# Patient Record
Sex: Female | Born: 1935 | Race: Black or African American | Hispanic: No | Smoking: Never smoker
Health system: Southern US, Community
[De-identification: ages and names within clinical notes are randomized; demographics above are authoritative.]

## PROBLEM LIST (undated history)

## (undated) DIAGNOSIS — E0789 Other specified disorders of thyroid: Secondary | ICD-10-CM

## (undated) DIAGNOSIS — I482 Chronic atrial fibrillation, unspecified: Secondary | ICD-10-CM

## (undated) DIAGNOSIS — I10 Essential (primary) hypertension: Secondary | ICD-10-CM

## (undated) DIAGNOSIS — E785 Hyperlipidemia, unspecified: Secondary | ICD-10-CM

## (undated) DIAGNOSIS — D649 Anemia, unspecified: Secondary | ICD-10-CM

## (undated) DIAGNOSIS — N183 Chronic kidney disease, stage 3 unspecified: Secondary | ICD-10-CM

## (undated) DIAGNOSIS — R569 Unspecified convulsions: Secondary | ICD-10-CM

## (undated) DIAGNOSIS — M1990 Unspecified osteoarthritis, unspecified site: Secondary | ICD-10-CM

## (undated) DIAGNOSIS — G459 Transient cerebral ischemic attack, unspecified: Secondary | ICD-10-CM

## (undated) DIAGNOSIS — R600 Localized edema: Secondary | ICD-10-CM

## (undated) DIAGNOSIS — E119 Type 2 diabetes mellitus without complications: Secondary | ICD-10-CM

## (undated) HISTORY — PX: CHOLECYSTECTOMY: SHX55

---

## 1999-08-16 ENCOUNTER — Inpatient Hospital Stay (HOSPITAL_COMMUNITY): Admission: EM | Admit: 1999-08-16 | Discharge: 1999-08-18 | Payer: Self-pay | Admitting: Cardiology

## 2001-09-27 ENCOUNTER — Encounter: Payer: Self-pay | Admitting: Emergency Medicine

## 2001-09-27 ENCOUNTER — Inpatient Hospital Stay (HOSPITAL_COMMUNITY): Admission: EM | Admit: 2001-09-27 | Discharge: 2001-09-28 | Payer: Self-pay | Admitting: Emergency Medicine

## 2003-05-09 ENCOUNTER — Emergency Department (HOSPITAL_COMMUNITY): Admission: EM | Admit: 2003-05-09 | Discharge: 2003-05-09 | Payer: Self-pay | Admitting: Emergency Medicine

## 2005-04-08 ENCOUNTER — Emergency Department (HOSPITAL_COMMUNITY): Admission: EM | Admit: 2005-04-08 | Discharge: 2005-04-08 | Payer: Self-pay | Admitting: Emergency Medicine

## 2005-10-17 ENCOUNTER — Emergency Department (HOSPITAL_COMMUNITY): Admission: EM | Admit: 2005-10-17 | Discharge: 2005-10-17 | Payer: Self-pay | Admitting: Emergency Medicine

## 2007-09-23 ENCOUNTER — Emergency Department (HOSPITAL_COMMUNITY): Admission: EM | Admit: 2007-09-23 | Discharge: 2007-09-23 | Payer: Self-pay | Admitting: Emergency Medicine

## 2007-11-16 ENCOUNTER — Emergency Department (HOSPITAL_COMMUNITY): Admission: EM | Admit: 2007-11-16 | Discharge: 2007-11-16 | Payer: Self-pay | Admitting: Emergency Medicine

## 2007-12-01 ENCOUNTER — Emergency Department (HOSPITAL_COMMUNITY): Admission: EM | Admit: 2007-12-01 | Discharge: 2007-12-01 | Payer: Self-pay | Admitting: Emergency Medicine

## 2008-09-19 ENCOUNTER — Emergency Department (HOSPITAL_COMMUNITY): Admission: EM | Admit: 2008-09-19 | Discharge: 2008-09-19 | Payer: Self-pay | Admitting: Emergency Medicine

## 2009-04-13 ENCOUNTER — Encounter (INDEPENDENT_AMBULATORY_CARE_PROVIDER_SITE_OTHER): Payer: Self-pay | Admitting: *Deleted

## 2009-04-13 LAB — CONVERTED CEMR LAB
ALT: 13 units/L
AST: 12 units/L
Albumin: 4.1 g/dL
Alkaline Phosphatase: 106 units/L
BUN: 26 mg/dL
Bilirubin, Direct: 0.2 mg/dL
CO2: 26 meq/L
Calcium: 9.3 mg/dL
Chloride: 100 meq/L
Cholesterol: 206 mg/dL
Creatinine, Ser: 1.29 mg/dL
Glucose, Bld: 200 mg/dL
HDL: 85 mg/dL
Hgb A1c MFr Bld: 9 %
LDL Cholesterol: 104 mg/dL
Potassium: 3.3 meq/L
Sodium: 140 meq/L
Total Protein: 7.9 g/dL
Triglycerides: 87 mg/dL

## 2009-05-04 ENCOUNTER — Encounter (INDEPENDENT_AMBULATORY_CARE_PROVIDER_SITE_OTHER): Payer: Self-pay | Admitting: *Deleted

## 2009-07-29 ENCOUNTER — Inpatient Hospital Stay (HOSPITAL_COMMUNITY): Admission: EM | Admit: 2009-07-29 | Discharge: 2009-08-03 | Payer: Self-pay | Admitting: Emergency Medicine

## 2009-09-28 ENCOUNTER — Encounter (HOSPITAL_COMMUNITY): Admission: RE | Admit: 2009-09-28 | Discharge: 2009-10-28 | Payer: Self-pay | Admitting: Orthopaedic Surgery

## 2009-10-29 ENCOUNTER — Encounter (HOSPITAL_COMMUNITY): Admission: RE | Admit: 2009-10-29 | Discharge: 2009-11-28 | Payer: Self-pay | Admitting: Orthopaedic Surgery

## 2010-05-21 ENCOUNTER — Encounter: Payer: Self-pay | Admitting: Family Medicine

## 2010-05-22 ENCOUNTER — Encounter: Payer: Self-pay | Admitting: Family Medicine

## 2010-05-31 NOTE — Miscellaneous (Signed)
Summary: labs bmp,lipid,liver,a1c 04/13/2009  Clinical Lists Changes  Observations: Added new observation of CALCIUM: 9.3 mg/dL (04/13/2009 8:31) Added new observation of ALBUMIN: 4.1 g/dL (04/13/2009 8:31) Added new observation of PROTEIN, TOT: 7.9 g/dL (04/13/2009 8:31) Added new observation of SGPT (ALT): 13 units/L (04/13/2009 8:31) Added new observation of SGOT (AST): 12 units/L (04/13/2009 8:31) Added new observation of ALK PHOS: 106 units/L (04/13/2009 8:31) Added new observation of BILI DIRECT: 0.2 mg/dL (04/13/2009 8:31) Added new observation of CREATININE: 1.29 mg/dL (04/13/2009 8:31) Added new observation of BUN: 26 mg/dL (04/13/2009 8:31) Added new observation of BG RANDOM: 200 mg/dL (04/13/2009 8:31) Added new observation of CO2 PLSM/SER: 26 meq/L (04/13/2009 8:31) Added new observation of CL SERUM: 100 meq/L (04/13/2009 8:31) Added new observation of K SERUM: 3.3 meq/L (04/13/2009 8:31) Added new observation of NA: 140 meq/L (04/13/2009 8:31) Added new observation of LDL: 104 mg/dL (04/13/2009 8:31) Added new observation of HDL: 85 mg/dL (04/13/2009 8:31) Added new observation of TRIGLYC TOT: 87 mg/dL (04/13/2009 8:31) Added new observation of CHOLESTEROL: 206 mg/dL (04/13/2009 8:31) Added new observation of HGBA1C: 9.0 % (04/13/2009 8:31)

## 2010-07-20 LAB — CBC
HCT: 27.2 % — ABNORMAL LOW (ref 36.0–46.0)
HCT: 27.2 % — ABNORMAL LOW (ref 36.0–46.0)
HCT: 27.8 % — ABNORMAL LOW (ref 36.0–46.0)
HCT: 27.9 % — ABNORMAL LOW (ref 36.0–46.0)
HCT: 32.2 % — ABNORMAL LOW (ref 36.0–46.0)
Hemoglobin: 11 g/dL — ABNORMAL LOW (ref 12.0–15.0)
Hemoglobin: 9.3 g/dL — ABNORMAL LOW (ref 12.0–15.0)
Hemoglobin: 9.4 g/dL — ABNORMAL LOW (ref 12.0–15.0)
Hemoglobin: 9.5 g/dL — ABNORMAL LOW (ref 12.0–15.0)
Hemoglobin: 9.6 g/dL — ABNORMAL LOW (ref 12.0–15.0)
MCHC: 34.1 g/dL (ref 30.0–36.0)
MCHC: 34.2 g/dL (ref 30.0–36.0)
MCHC: 34.3 g/dL (ref 30.0–36.0)
MCHC: 34.3 g/dL (ref 30.0–36.0)
MCHC: 34.5 g/dL (ref 30.0–36.0)
MCV: 84 fL (ref 78.0–100.0)
MCV: 84 fL (ref 78.0–100.0)
MCV: 85.1 fL (ref 78.0–100.0)
MCV: 86.1 fL (ref 78.0–100.0)
MCV: 91.7 fL (ref 78.0–100.0)
Platelets: 146 10*3/uL — ABNORMAL LOW (ref 150–400)
Platelets: 161 10*3/uL (ref 150–400)
Platelets: 163 10*3/uL (ref 150–400)
Platelets: 164 10*3/uL (ref 150–400)
Platelets: 173 10*3/uL (ref 150–400)
RBC: 3.04 MIL/uL — ABNORMAL LOW (ref 3.87–5.11)
RBC: 3.24 MIL/uL — ABNORMAL LOW (ref 3.87–5.11)
RBC: 3.24 MIL/uL — ABNORMAL LOW (ref 3.87–5.11)
RBC: 3.24 MIL/uL — ABNORMAL LOW (ref 3.87–5.11)
RBC: 3.78 MIL/uL — ABNORMAL LOW (ref 3.87–5.11)
RDW: 14.8 % (ref 11.5–15.5)
RDW: 14.9 % (ref 11.5–15.5)
RDW: 15.3 % (ref 11.5–15.5)
RDW: 15.3 % (ref 11.5–15.5)
RDW: 15.6 % — ABNORMAL HIGH (ref 11.5–15.5)
WBC: 10.2 10*3/uL (ref 4.0–10.5)
WBC: 10.4 10*3/uL (ref 4.0–10.5)
WBC: 10.8 10*3/uL — ABNORMAL HIGH (ref 4.0–10.5)
WBC: 9.4 10*3/uL (ref 4.0–10.5)
WBC: 9.6 10*3/uL (ref 4.0–10.5)

## 2010-07-20 LAB — BASIC METABOLIC PANEL
BUN: 14 mg/dL (ref 6–23)
BUN: 17 mg/dL (ref 6–23)
BUN: 17 mg/dL (ref 6–23)
BUN: 18 mg/dL (ref 6–23)
BUN: 20 mg/dL (ref 6–23)
CO2: 29 mEq/L (ref 19–32)
CO2: 29 mEq/L (ref 19–32)
CO2: 30 mEq/L (ref 19–32)
CO2: 31 mEq/L (ref 19–32)
CO2: 32 mEq/L (ref 19–32)
Calcium: 8.6 mg/dL (ref 8.4–10.5)
Calcium: 8.7 mg/dL (ref 8.4–10.5)
Calcium: 8.8 mg/dL (ref 8.4–10.5)
Calcium: 8.9 mg/dL (ref 8.4–10.5)
Calcium: 9.4 mg/dL (ref 8.4–10.5)
Chloride: 101 mEq/L (ref 96–112)
Chloride: 101 mEq/L (ref 96–112)
Chloride: 102 mEq/L (ref 96–112)
Chloride: 104 mEq/L (ref 96–112)
Chloride: 99 mEq/L (ref 96–112)
Creatinine, Ser: 0.95 mg/dL (ref 0.4–1.2)
Creatinine, Ser: 0.98 mg/dL (ref 0.4–1.2)
Creatinine, Ser: 1.14 mg/dL (ref 0.4–1.2)
Creatinine, Ser: 1.17 mg/dL (ref 0.4–1.2)
Creatinine, Ser: 1.32 mg/dL — ABNORMAL HIGH (ref 0.4–1.2)
GFR calc Af Amer: 48 mL/min — ABNORMAL LOW (ref >60)
GFR calc Af Amer: 55 mL/min — ABNORMAL LOW (ref >60)
GFR calc Af Amer: 57 mL/min — ABNORMAL LOW (ref >60)
GFR calc Af Amer: >60 mL/min (ref >60)
GFR calc Af Amer: >60 mL/min (ref >60)
GFR calc non Af Amer: 40 mL/min — ABNORMAL LOW (ref >60)
GFR calc non Af Amer: 46 mL/min — ABNORMAL LOW (ref >60)
GFR calc non Af Amer: 47 mL/min — ABNORMAL LOW (ref >60)
GFR calc non Af Amer: 56 mL/min — ABNORMAL LOW (ref >60)
GFR calc non Af Amer: 58 mL/min — ABNORMAL LOW (ref >60)
Glucose, Bld: 113 mg/dL — ABNORMAL HIGH (ref 70–99)
Glucose, Bld: 165 mg/dL — ABNORMAL HIGH (ref 70–99)
Glucose, Bld: 321 mg/dL — ABNORMAL HIGH (ref 70–99)
Glucose, Bld: 88 mg/dL (ref 70–99)
Glucose, Bld: 95 mg/dL (ref 70–99)
Potassium: 3.2 mEq/L — ABNORMAL LOW (ref 3.5–5.1)
Potassium: 3.5 mEq/L (ref 3.5–5.1)
Potassium: 3.7 mEq/L (ref 3.5–5.1)
Potassium: 3.8 mEq/L (ref 3.5–5.1)
Potassium: 3.8 mEq/L (ref 3.5–5.1)
Sodium: 136 mEq/L (ref 135–145)
Sodium: 137 mEq/L (ref 135–145)
Sodium: 137 mEq/L (ref 135–145)
Sodium: 138 mEq/L (ref 135–145)
Sodium: 138 mEq/L (ref 135–145)

## 2010-07-20 LAB — GLUCOSE, CAPILLARY
Glucose-Capillary: 101 mg/dL — ABNORMAL HIGH (ref 70–99)
Glucose-Capillary: 109 mg/dL — ABNORMAL HIGH (ref 70–99)
Glucose-Capillary: 112 mg/dL — ABNORMAL HIGH (ref 70–99)
Glucose-Capillary: 133 mg/dL — ABNORMAL HIGH (ref 70–99)
Glucose-Capillary: 141 mg/dL — ABNORMAL HIGH (ref 70–99)
Glucose-Capillary: 148 mg/dL — ABNORMAL HIGH (ref 70–99)
Glucose-Capillary: 161 mg/dL — ABNORMAL HIGH (ref 70–99)
Glucose-Capillary: 168 mg/dL — ABNORMAL HIGH (ref 70–99)
Glucose-Capillary: 174 mg/dL — ABNORMAL HIGH (ref 70–99)
Glucose-Capillary: 177 mg/dL — ABNORMAL HIGH (ref 70–99)
Glucose-Capillary: 180 mg/dL — ABNORMAL HIGH (ref 70–99)
Glucose-Capillary: 199 mg/dL — ABNORMAL HIGH (ref 70–99)
Glucose-Capillary: 200 mg/dL — ABNORMAL HIGH (ref 70–99)
Glucose-Capillary: 204 mg/dL — ABNORMAL HIGH (ref 70–99)
Glucose-Capillary: 220 mg/dL — ABNORMAL HIGH (ref 70–99)
Glucose-Capillary: 222 mg/dL — ABNORMAL HIGH (ref 70–99)
Glucose-Capillary: 224 mg/dL — ABNORMAL HIGH (ref 70–99)
Glucose-Capillary: 338 mg/dL — ABNORMAL HIGH (ref 70–99)
Glucose-Capillary: 94 mg/dL (ref 70–99)
Glucose-Capillary: 94 mg/dL (ref 70–99)

## 2010-07-20 LAB — DIFFERENTIAL
Basophils Absolute: 0 10*3/uL (ref 0.0–0.1)
Basophils Absolute: 0 10*3/uL (ref 0.0–0.1)
Basophils Absolute: 0 10*3/uL (ref 0.0–0.1)
Basophils Absolute: 0 10*3/uL (ref 0.0–0.1)
Basophils Absolute: 0 10*3/uL (ref 0.0–0.1)
Basophils Relative: 0 % (ref 0–1)
Basophils Relative: 0 % (ref 0–1)
Basophils Relative: 0 % (ref 0–1)
Basophils Relative: 0 % (ref 0–1)
Basophils Relative: 1 % (ref 0–1)
Eosinophils Absolute: 0 10*3/uL (ref 0.0–0.7)
Eosinophils Absolute: 0.5 10*3/uL (ref 0.0–0.7)
Eosinophils Absolute: 0.5 10*3/uL (ref 0.0–0.7)
Eosinophils Absolute: 0.6 10*3/uL (ref 0.0–0.7)
Eosinophils Absolute: 0.6 10*3/uL (ref 0.0–0.7)
Eosinophils Relative: 0 % (ref 0–5)
Eosinophils Relative: 5 % (ref 0–5)
Eosinophils Relative: 5 % (ref 0–5)
Eosinophils Relative: 6 % — ABNORMAL HIGH (ref 0–5)
Eosinophils Relative: 7 % — ABNORMAL HIGH (ref 0–5)
Lymphocytes Relative: 18 % (ref 12–46)
Lymphocytes Relative: 21 % (ref 12–46)
Lymphocytes Relative: 23 % (ref 12–46)
Lymphocytes Relative: 26 % (ref 12–46)
Lymphocytes Relative: 26 % (ref 12–46)
Lymphs Abs: 1.9 10*3/uL (ref 0.7–4.0)
Lymphs Abs: 2.2 10*3/uL (ref 0.7–4.0)
Lymphs Abs: 2.2 10*3/uL (ref 0.7–4.0)
Lymphs Abs: 2.4 10*3/uL (ref 0.7–4.0)
Lymphs Abs: 2.7 10*3/uL (ref 0.7–4.0)
Monocytes Absolute: 0.5 10*3/uL (ref 0.1–1.0)
Monocytes Absolute: 0.9 10*3/uL (ref 0.1–1.0)
Monocytes Absolute: 0.9 10*3/uL (ref 0.1–1.0)
Monocytes Absolute: 0.9 10*3/uL (ref 0.1–1.0)
Monocytes Absolute: 1 10*3/uL (ref 0.1–1.0)
Monocytes Relative: 10 % (ref 3–12)
Monocytes Relative: 10 % (ref 3–12)
Monocytes Relative: 5 % (ref 3–12)
Monocytes Relative: 9 % (ref 3–12)
Monocytes Relative: 9 % (ref 3–12)
Neutro Abs: 5.4 10*3/uL (ref 1.7–7.7)
Neutro Abs: 5.9 10*3/uL (ref 1.7–7.7)
Neutro Abs: 6.3 10*3/uL (ref 1.7–7.7)
Neutro Abs: 6.5 10*3/uL (ref 1.7–7.7)
Neutro Abs: 8.2 10*3/uL — ABNORMAL HIGH (ref 1.7–7.7)
Neutrophils Relative %: 57 % (ref 43–77)
Neutrophils Relative %: 61 % (ref 43–77)
Neutrophils Relative %: 61 % (ref 43–77)
Neutrophils Relative %: 64 % (ref 43–77)
Neutrophils Relative %: 77 % (ref 43–77)

## 2010-07-20 LAB — RETICULOCYTES
RBC.: 3.04 MIL/uL — ABNORMAL LOW (ref 3.87–5.11)
Retic Count, Absolute: 82.1 10*3/uL (ref 19.0–186.0)
Retic Ct Pct: 2.7 % (ref 0.4–3.1)

## 2010-07-20 LAB — FERRITIN: Ferritin: 76 ng/mL (ref 10–291)

## 2010-07-20 LAB — HEMOGLOBIN A1C
Hgb A1c MFr Bld: 8 % — ABNORMAL HIGH (ref 4.6–6.1)
Mean Plasma Glucose: 183 mg/dL

## 2010-07-20 LAB — VITAMIN B12: Vitamin B-12: 461 pg/mL (ref 211–911)

## 2010-07-20 LAB — IRON AND TIBC
Iron: 13 ug/dL — ABNORMAL LOW (ref 42–135)
Saturation Ratios: 6 % — ABNORMAL LOW (ref 20–55)
TIBC: 209 ug/dL — ABNORMAL LOW (ref 250–470)
UIBC: 196 ug/dL

## 2010-07-20 LAB — MAGNESIUM: Magnesium: 1.6 mg/dL (ref 1.5–2.5)

## 2010-07-20 LAB — FOLATE: Folate: 14.4 ng/mL

## 2010-07-24 LAB — CBC
HCT: 32.3 % — ABNORMAL LOW (ref 36.0–46.0)
Hemoglobin: 11.1 g/dL — ABNORMAL LOW (ref 12.0–15.0)
MCHC: 34.5 g/dL (ref 30.0–36.0)
MCV: 84.8 fL (ref 78.0–100.0)
Platelets: 165 10*3/uL (ref 150–400)
RBC: 3.81 MIL/uL — ABNORMAL LOW (ref 3.87–5.11)
RDW: 15.3 % (ref 11.5–15.5)
WBC: 8.8 10*3/uL (ref 4.0–10.5)

## 2010-07-24 LAB — DIFFERENTIAL
Basophils Absolute: 0 10*3/uL (ref 0.0–0.1)
Basophils Relative: 0 % (ref 0–1)
Eosinophils Absolute: 0.3 10*3/uL (ref 0.0–0.7)
Eosinophils Relative: 3 % (ref 0–5)
Lymphocytes Relative: 28 % (ref 12–46)
Lymphs Abs: 2.5 10*3/uL (ref 0.7–4.0)
Monocytes Absolute: 0.8 10*3/uL (ref 0.1–1.0)
Monocytes Relative: 9 % (ref 3–12)
Neutro Abs: 5.2 10*3/uL (ref 1.7–7.7)
Neutrophils Relative %: 60 % (ref 43–77)

## 2010-07-24 LAB — GLUCOSE, CAPILLARY
Glucose-Capillary: 134 mg/dL — ABNORMAL HIGH (ref 70–99)
Glucose-Capillary: 215 mg/dL — ABNORMAL HIGH (ref 70–99)

## 2010-07-24 LAB — URINE MICROSCOPIC-ADD ON

## 2010-07-24 LAB — URINALYSIS, ROUTINE W REFLEX MICROSCOPIC
Bilirubin Urine: NEGATIVE
Glucose, UA: NEGATIVE mg/dL
Ketones, ur: NEGATIVE mg/dL
Leukocytes, UA: NEGATIVE
Nitrite: NEGATIVE
Protein, ur: NEGATIVE mg/dL
Specific Gravity, Urine: 1.015 (ref 1.005–1.030)
Urobilinogen, UA: 0.2 mg/dL (ref 0.0–1.0)
pH: 5 (ref 5.0–8.0)

## 2010-07-24 LAB — APTT: aPTT: 32 seconds (ref 24–37)

## 2010-07-24 LAB — BASIC METABOLIC PANEL
BUN: 25 mg/dL — ABNORMAL HIGH (ref 6–23)
CO2: 31 mEq/L (ref 19–32)
Calcium: 9.5 mg/dL (ref 8.4–10.5)
Chloride: 102 mEq/L (ref 96–112)
Creatinine, Ser: 1.45 mg/dL — ABNORMAL HIGH (ref 0.4–1.2)
GFR calc Af Amer: 43 mL/min — ABNORMAL LOW (ref >60)
GFR calc non Af Amer: 36 mL/min — ABNORMAL LOW (ref >60)
Glucose, Bld: 150 mg/dL — ABNORMAL HIGH (ref 70–99)
Potassium: 3.5 mEq/L (ref 3.5–5.1)
Sodium: 139 mEq/L (ref 135–145)

## 2010-07-24 LAB — PROTIME-INR
INR: 1.09 (ref 0.00–1.49)
Prothrombin Time: 14 seconds (ref 11.6–15.2)

## 2010-08-09 LAB — POCT CARDIAC MARKERS
CKMB, poc: 1.1 ng/mL (ref 1.0–8.0)
CKMB, poc: 1.4 ng/mL (ref 1.0–8.0)
Myoglobin, poc: 118 ng/mL (ref 12–200)
Myoglobin, poc: 93.7 ng/mL (ref 12–200)
Troponin i, poc: <0.05 ng/mL (ref 0.00–0.09)
Troponin i, poc: <0.05 ng/mL (ref 0.00–0.09)

## 2010-08-09 LAB — BASIC METABOLIC PANEL
BUN: 32 mg/dL — ABNORMAL HIGH (ref 6–23)
CO2: 28 mEq/L (ref 19–32)
Calcium: 9.4 mg/dL (ref 8.4–10.5)
Chloride: 101 mEq/L (ref 96–112)
Creatinine, Ser: 1.49 mg/dL — ABNORMAL HIGH (ref 0.4–1.2)
GFR calc Af Amer: 42 mL/min — ABNORMAL LOW (ref >60)
GFR calc non Af Amer: 35 mL/min — ABNORMAL LOW (ref >60)
Glucose, Bld: 147 mg/dL — ABNORMAL HIGH (ref 70–99)
Potassium: 3.5 mEq/L (ref 3.5–5.1)
Sodium: 135 mEq/L (ref 135–145)

## 2010-08-09 LAB — DIFFERENTIAL
Basophils Absolute: 0 10*3/uL (ref 0.0–0.1)
Basophils Relative: 1 % (ref 0–1)
Eosinophils Absolute: 0.3 10*3/uL (ref 0.0–0.7)
Eosinophils Relative: 4 % (ref 0–5)
Lymphocytes Relative: 40 % (ref 12–46)
Lymphs Abs: 2.7 10*3/uL (ref 0.7–4.0)
Monocytes Absolute: 0.5 10*3/uL (ref 0.1–1.0)
Monocytes Relative: 8 % (ref 3–12)
Neutro Abs: 3.3 10*3/uL (ref 1.7–7.7)
Neutrophils Relative %: 48 % (ref 43–77)

## 2010-08-09 LAB — CBC
HCT: 32.6 % — ABNORMAL LOW (ref 36.0–46.0)
Hemoglobin: 11.3 g/dL — ABNORMAL LOW (ref 12.0–15.0)
MCHC: 34.7 g/dL (ref 30.0–36.0)
MCV: 86.3 fL (ref 78.0–100.0)
Platelets: 176 10*3/uL (ref 150–400)
RBC: 3.78 MIL/uL — ABNORMAL LOW (ref 3.87–5.11)
RDW: 13.8 % (ref 11.5–15.5)
WBC: 6.8 10*3/uL (ref 4.0–10.5)

## 2010-08-09 LAB — BRAIN NATRIURETIC PEPTIDE: Pro B Natriuretic peptide (BNP): 112 pg/mL — ABNORMAL HIGH (ref 0.0–100.0)

## 2010-08-31 ENCOUNTER — Other Ambulatory Visit (HOSPITAL_COMMUNITY): Payer: Self-pay | Admitting: Family Medicine

## 2010-08-31 ENCOUNTER — Telehealth: Payer: Self-pay

## 2010-08-31 DIAGNOSIS — Z139 Encounter for screening, unspecified: Secondary | ICD-10-CM

## 2010-08-31 NOTE — Telephone Encounter (Signed)
LMOM for pt to call in reference to the screening colonoscopy.

## 2010-09-08 ENCOUNTER — Ambulatory Visit (HOSPITAL_COMMUNITY): Payer: PRIVATE HEALTH INSURANCE

## 2010-09-08 NOTE — Telephone Encounter (Signed)
LM for pt to call or come by to get triaged.

## 2010-09-12 NOTE — Telephone Encounter (Signed)
Mailed letter to call to schedule screening colonoscopy.

## 2010-09-16 NOTE — Discharge Summary (Signed)
[facility]  Patient:    Chelsea Meza, Chelsea Meza Visit Number: [id number] MRN: [id number]          Service Type: MED Location: 2A A212 01 Attending Physician:  Delorise Jackson Admit Date:  09/27/2001 Discharge Date: 09/28/2001                             Discharge Summary  HISTORY OF PRESENT ILLNESS:  A 75 year old female with a history of rapid onset of palpitations, general weakness and nausea. The patient was in a store. She walked to the hospital and was seen in the emergency room and was noted to have supraventricular tachycardia with a heart rate of 190. She was treated with IV Adenocard 6 mg and Cardizem 20 mg IV. She converted to normal sinus rhythm with an initial rate of about 94. Her rate later dropped to about 74. Her initial blood pressure was 87/55, rose to 120/75. The patient became asymptomatic. She was stable at the time that I evaluated her. She was admitted for continued monitoring and for continued treatment of her recurrent paroxysmal supraventricular tachycardia.  PAST MEDICAL HISTORY:  She has had multiple episodes of PSVT in the past, some of which resolved spontaneously. She was last hospitalized with PSVT in January of 1999 and during that hospitalization she had an echocardiogram and stress Cardiolite which were both normal. Other problems include hypertension, chronic anxiety, osteoarthritis, and hyperlipidemia.  PAST SURGICAL HISTORY:  Cholecystectomy.  FAMILY HISTORY:  Positive for atherosclerotic heart disease.  MEDICATIONS:  Verapamil, Altace.  PHYSICAL EXAMINATION:  GENERAL:  She was in no acute distress though she was clammy and diaphoretic. She denied chest pain. General physical exam was unremarkable as noted in the admitting note. She did not have tachycardia. She did not have peripheral edema.  VITAL SIGNS:  Blood pressure 130/80, pulse 74, respirations 22, temperature 98.6.  HOSPITAL COURSE:  The patient was  admitted to the telemetry floor and monitored. She was started on her baseline medications of verapamil and Altace. Cardiac enzymes were done and were negative x3. Followup EKG showed no acute changes. Chest x-ray showed mild cardiomegaly, otherwise unremarkable. She had no tendency towards tachyarrhythmia during the next 24 hours. She was ambulated with out difficulty.  DISPOSITION:  She had no complaints and was discharged on her baseline medications on the afternoon of May 31st in satisfactory condition.  FOLLOWUP:  She will have a close followup with Dr. Berdine Addison in the office. Attending Physician:  Delorise Jackson DD:  11/13/01 TD:  11/15/01 Job: [id number]

## 2010-09-16 NOTE — Discharge Summary (Signed)
[place]. [facility]  Patient:    Chelsea Meza, Chelsea Meza                      MRN: [id number] Adm. Date:  [id number] Disc. Date: 08/18/99 Attending:  Garth Bigness Dictator:   Richardson Dopp, P.A.                           Discharge Summary  DATE OF BIRTH:  [birth year]  DISCHARGE DIAGNOSES:  1. Chest pain worrisome for ischemia status post cardiac catheterization with     trivial coronary artery disease.  2. Cardiac catheterization August 18, 1999, results: Left anterior descending     artery large vessel, mild disease, 10%.  Left main normal.  Left     circumflex, two large proximal marginals, mild.  Right coronary artery     dominant, mild.  No mitral regurgitation.  Ejection fraction greater than     65%. Intravascular ultrasound performed, reversal mild concentric plaque.  3. Abnormal Cardiolite in 1996, negative Cardiolite in 1999, but hypokinesis,     inferior wall and apex, no prior catheterizations.  4. Hypertension.  5. Family history of coronary artery disease.  6. Hyperlipidemia.  7. Status post cholecystectomy.  8. History of osteoarthritis.  9. History of supraventricular tachycardia. 10. History of anxiety.  ADMISSION HISTORY:  This 75 year old African-American female with a history of SVT and angina presented with left chest tightness associated with shortness of breath after working a 10-day stretch.  She also had diaphoresis and dizziness associated with this.  She stated "everything looked white."  She had a stress Cardiolite in November 1996 with reversible ischemia, anterior and anterior septal wall.  She never had a catheterization.  She took one sublingual nitroglycerin with relief, but pain returned and became worse. That prompted her to come to the ER.  Her pain improved after vomiting about 100 cc of yellow fluid in the ER.  She had a Cardiolite in January 1999 with no stress-induced ischemia and EF of 58%, mild hypokinesis of  inferior wall and apex.  She was admitted to telemetry and placed on IV normal saline, heparin drip, and O2 via nasal cannula.  She had cardiac enzymes drawn.  ADMISSION PHYSICAL EXAMINATION:  Revealed a 75 year old female in no acute distress.  Blood pressure 106/52, pulse 70.  Neck without JVD or bruit.  Lungs clear.  Heart had regular rate and rhythm.  Extremities were without clubbing, cyanosis, or edema and positive pulses.  LABORATORY DATA:  WBC 6.4, hemoglobin 12.5, hematocrit 37.4, platelet count 245,000.  PT 14.4, INR 1.34.  Glucose 160, BUN 19, creatinine 1.4, sodium 138, potassium 3.8, chloride 98, CO2 26, calcium 8.9, total protein 7.6, albumin 3.5, SGPT 20, alkaline phosphatase 81, total bilirubin 0.7.  Amylase 75, lipase 127.  Magnesium 1.6.  Thyroxine 8.2, T3 uptake at 37, free thyroxine index 3.0, TSH 2.18.  Initial CK 62, CK-MB 1, troponin I 0.0.  Second CK 48, CK-MB 1, troponin I 0.3.  Last CK 40, CK-MB 1.  Lipid profile: Total cholesterol 229, triglycerides 101, HDL 37, LDL 172.  Chest x-ray: Cardiomegaly with no active disease.  HOSPITAL COURSE:  The patient was seen by [place] for the first time on August 16, 1999.  Her symptoms were felt to be worrisome for ischemia.  It was felt she needed diagnostic cardiac catheterization.  Her EKG showed heart rate of 68, sinus rhythm, left axis  deviation and nonspecific T wave changes.  She was transferred to [facility] on August 16, 1999, from [facility]. She underwent cardiac catheterization on August 18, 1999.  She had no immediate complications.  The results are noted above.  She had trivial CAD and normal LV function.  Her LAD showed a large vessel with mild disease of 10%, EF greater than 65%, no MR.  The patient was returned to her room and was for discharge later this evening if she remains stable.  She will remain on her present medications.  DISCHARGE MEDICATIONS: 1. Reversal trial drug, x number. 2.  Clonidine 0.1 mg t.i.d. 3. Verapamil 80 mg t.i.d. 4. Maxzide 75/50 q.a.m. 5. BuSpar 10 mg b.i.d. 6. Enteric-coated aspirin 325 mg q.d. 7. Imdur 30 mg q.d. 8. Nitroglycerin sublingual p.r.n. chest pain.  ACTIVITY:  The patient is to refrain from driving, heavy lifting, exertional activity, or sex for three days.  DIET:  She is to maintain a low-fat, low-cholesterol, low-sodium diet.  WOUND CARE:  The patient should watch her groin for any increased swelling, bleeding, or bruising and call our office with concerns.  FOLLOWUP:  With Dr. Verl Blalock on Monday, Sep 05, 1999, at 10:15 a.m. DD:  08/18/99 TD:  08/18/99 Job: [id number]

## 2010-09-16 NOTE — Discharge Summary (Signed)
[place]. [facility]  Patient:    Chelsea Meza, Chelsea Meza                      MRN: [id number] Adm. Date:  [id number] Disc. Date: 08/18/99 Attending:  Garth Bigness Dictator:   Richardson Dopp, P.A.-C. CC:         Iona Beard, M.D. - [place]na [place], [place]                           Discharge Summary  DATE OF BIRTH:  01/26/1936  There is no dictation on this patient. DD:  08/18/99 TD:  08/18/99 Job: [id number]

## 2010-09-16 NOTE — Cardiovascular Report (Signed)
[place]. [facility]  Patient:    Chelsea Meza, Chelsea Meza                      MRN: [id number] Proc. Date: 08/18/99 Adm. Date:  [id number] Attending:  Garth Bigness CC:         Christy Sartorius, M.D. [place] [place], M.D. [facility]             Iona Beard, M.D.                        Cardiac Catheterization  PROCEDURES PERFORMED: 1. Left heart catheterization. 2. Left ventriculogram. 3. Intravascular ultrasound of the left anterior descending.  DIAGNOSES: 1. Trivial coronary artery disease by angiogram. 2. Normal left ventricular systolic function.  HISTORY:  Chelsea Meza is a 75 year old black female with multiple cardiac risk factors who presents with progressive substernal chest discomfort.  The patient was admitted to [facility] where she subsequently ruled out for acute myocardial infarction.  Due to her history of abnormal stress test in the past as well as her current symptoms, she presents now for a left heart catheterization.  TECHNIQUE:  After informed consent was obtained, the patient was brought to the catheterization lab where both groins were sterilely prepped and draped and 1% lidocaine was used to infiltrate the right groin.  A #6 French sheath was placed in the right femoral artery using modified Seldinger technique.  A #6 [place] JL-4 and JR-4 catheters were then used to engage the left and right coronary arteries and selective angiography performed in various projections using manual injections of contrast.  A #6 French pigtail catheter was then advanced into the left ventricle and a left ventriculogram performed using power injections of contrast.  The #6 French sheath in the right femoral artery was then exchanged for a #7 French sheath in preparation for intravascular ultrasound of the LAD. A #7 [place] JL-4 guide catheter was used to engage the left coronary artery.  Heparin, 3000 units were given intravenously and a 0.14  inch Hi-Torque floppy wire advanced into the LAD. Intravascular ultrasound was then performed of the proximal LAD using automated pullback after the administration of intracoronary nitroglycerin. Repeat angiography showed no evidence of vessel damage.  The guide catheter was then removed.  The sheath was secured into position.  The patient was transferred to the floor in stable condition.  FINDINGS ARE AS FOLLOWS: 1. Left main trunk:  Large caliber vessel with mild irregularities. 2. Left anterior descending:  This is a large caliber vessel with a large    first septal perforator in the proximal segment.  The left anterior    descending has mild irregularities at the ostium and at the takeoff of    the first septal perforator of perhaps 10-20%. 3. Left circumflex artery:  This gives off two large marginal branches    arising in the proximal segment.  The left circumflex system has mild    irregularities. 4. Right coronary artery:  Dominant.  This is a large caliber vessel that    provides the posterior descending artery in its terminal segment.  The    right coronary system has mild irregularities. 5. Left ventricle.  Normal end systolic and end diastolic dimensions.    Overall left ventricular function is well preserved.  Ejection fraction is    greater than 65%.  No  mitral regurgitation.  Left ventricular pressure is    140/10.  Aortic was 140/90.  Left ventricular end-diastolic pressure = 24.  ASSESSMENT AND PLAN:  Chelsea Meza is a 75 year old black female with substernal chest discomfort.  The patient has mild coronary artery disease by angiogram and normal left ventricular function.  This will be medically managed at this point and further investigation of her chest pain pursued. DD:  08/18/99 TD:  08/18/99 Job: [id number]

## 2011-01-25 LAB — CBC
HCT: 32.4 — ABNORMAL LOW
Hemoglobin: 11.1 — ABNORMAL LOW
MCHC: 34.3
MCV: 84.5
Platelets: 174
RBC: 3.84 — ABNORMAL LOW
RDW: 14.7
WBC: 6.7

## 2011-01-25 LAB — BASIC METABOLIC PANEL
BUN: 23
CO2: 30
Calcium: 9.4
Chloride: 104
Creatinine, Ser: 1.15
GFR calc Af Amer: 57 — ABNORMAL LOW
GFR calc non Af Amer: 47 — ABNORMAL LOW
Glucose, Bld: 120 — ABNORMAL HIGH
Potassium: 3.6
Sodium: 139

## 2011-01-25 LAB — DIFFERENTIAL
Basophils Absolute: 0.1
Basophils Relative: 2 — ABNORMAL HIGH
Eosinophils Absolute: 0.3
Eosinophils Relative: 4
Lymphocytes Relative: 31
Lymphs Abs: 2.1
Monocytes Absolute: 0.5
Monocytes Relative: 8
Neutro Abs: 3.7
Neutrophils Relative %: 55

## 2011-01-25 LAB — B-NATRIURETIC PEPTIDE (CONVERTED LAB): Pro B Natriuretic peptide (BNP): 31.5

## 2011-01-25 LAB — D-DIMER, QUANTITATIVE: D-Dimer, Quant: 0.43

## 2011-01-27 LAB — POCT CARDIAC MARKERS
CKMB, poc: <1 — ABNORMAL LOW
Myoglobin, poc: 77.2
Operator id: 213931
Troponin i, poc: <0.05

## 2011-01-27 LAB — CBC
HCT: 33.3 — ABNORMAL LOW
HCT: 35.5 — ABNORMAL LOW
Hemoglobin: 11.1 — ABNORMAL LOW
Hemoglobin: 11.8 — ABNORMAL LOW
MCHC: 33.3
MCHC: 33.2
MCV: 88.4
MCV: 85.3
Platelets: 214
Platelets: 194
RBC: 3.77 — ABNORMAL LOW
RBC: 4.15
RDW: 14.7
RDW: 14.5
WBC: 8.4
WBC: 5.7

## 2011-01-27 LAB — BASIC METABOLIC PANEL
BUN: 31 — ABNORMAL HIGH
BUN: 27 — ABNORMAL HIGH
CO2: 24
CO2: 32
Calcium: 8.7
Calcium: 9.4
Chloride: 102
Chloride: 105
Creatinine, Ser: 1.99 — ABNORMAL HIGH
Creatinine, Ser: 1.36 — ABNORMAL HIGH
GFR calc Af Amer: 30 — ABNORMAL LOW
GFR calc Af Amer: 47 — ABNORMAL LOW
GFR calc non Af Amer: 25 — ABNORMAL LOW
GFR calc non Af Amer: 38 — ABNORMAL LOW
Glucose, Bld: 205 — ABNORMAL HIGH
Glucose, Bld: 174 — ABNORMAL HIGH
Potassium: 3.3 — ABNORMAL LOW
Potassium: 4.3
Sodium: 135
Sodium: 140

## 2011-01-27 LAB — DIFFERENTIAL
Basophils Absolute: 0.1
Basophils Relative: 1
Eosinophils Absolute: 0.3
Eosinophils Relative: 5
Lymphocytes Relative: 32
Lymphs Abs: 1.8
Monocytes Absolute: 0.4
Monocytes Relative: 7
Neutro Abs: 3.1
Neutrophils Relative %: 55

## 2012-07-13 ENCOUNTER — Emergency Department (HOSPITAL_COMMUNITY): Payer: PRIVATE HEALTH INSURANCE

## 2012-07-13 ENCOUNTER — Inpatient Hospital Stay (HOSPITAL_COMMUNITY): Payer: PRIVATE HEALTH INSURANCE

## 2012-07-13 ENCOUNTER — Encounter (HOSPITAL_COMMUNITY): Payer: Self-pay

## 2012-07-13 ENCOUNTER — Inpatient Hospital Stay (HOSPITAL_COMMUNITY)
Admission: EM | Admit: 2012-07-13 | Discharge: 2012-07-15 | DRG: 638 | Disposition: A | Discharge status: Home / Self Care | Payer: PRIVATE HEALTH INSURANCE | Attending: Internal Medicine | Admitting: Internal Medicine

## 2012-07-13 DIAGNOSIS — E860 Dehydration: Secondary | ICD-10-CM | POA: Diagnosis present

## 2012-07-13 DIAGNOSIS — R202 Paresthesia of skin: Secondary | ICD-10-CM

## 2012-07-13 DIAGNOSIS — E1110 Type 2 diabetes mellitus with ketoacidosis without coma: Secondary | ICD-10-CM

## 2012-07-13 DIAGNOSIS — R29898 Other symptoms and signs involving the musculoskeletal system: Secondary | ICD-10-CM

## 2012-07-13 DIAGNOSIS — T383X5A Adverse effect of insulin and oral hypoglycemic [antidiabetic] drugs, initial encounter: Secondary | ICD-10-CM | POA: Diagnosis present

## 2012-07-13 DIAGNOSIS — I471 Supraventricular tachycardia, unspecified: Secondary | ICD-10-CM

## 2012-07-13 DIAGNOSIS — R209 Unspecified disturbances of skin sensation: Secondary | ICD-10-CM | POA: Diagnosis present

## 2012-07-13 DIAGNOSIS — E1165 Type 2 diabetes mellitus with hyperglycemia: Secondary | ICD-10-CM

## 2012-07-13 DIAGNOSIS — E872 Acidosis, unspecified: Secondary | ICD-10-CM | POA: Diagnosis present

## 2012-07-13 DIAGNOSIS — N179 Acute kidney failure, unspecified: Secondary | ICD-10-CM | POA: Diagnosis present

## 2012-07-13 DIAGNOSIS — R631 Polydipsia: Secondary | ICD-10-CM | POA: Diagnosis present

## 2012-07-13 DIAGNOSIS — R3589 Other polyuria: Secondary | ICD-10-CM | POA: Diagnosis present

## 2012-07-13 DIAGNOSIS — R059 Cough, unspecified: Secondary | ICD-10-CM

## 2012-07-13 DIAGNOSIS — Z7982 Long term (current) use of aspirin: Secondary | ICD-10-CM

## 2012-07-13 DIAGNOSIS — I10 Essential (primary) hypertension: Secondary | ICD-10-CM | POA: Diagnosis present

## 2012-07-13 DIAGNOSIS — N189 Chronic kidney disease, unspecified: Secondary | ICD-10-CM | POA: Diagnosis present

## 2012-07-13 DIAGNOSIS — E1100 Type 2 diabetes mellitus with hyperosmolarity without nonketotic hyperglycemic-hyperosmolar coma (NKHHC): Principal | ICD-10-CM | POA: Diagnosis present

## 2012-07-13 DIAGNOSIS — G8220 Paraplegia, unspecified: Secondary | ICD-10-CM | POA: Diagnosis present

## 2012-07-13 DIAGNOSIS — Z79899 Other long term (current) drug therapy: Secondary | ICD-10-CM

## 2012-07-13 HISTORY — DX: Essential (primary) hypertension: I10

## 2012-07-13 HISTORY — DX: Type 2 diabetes mellitus without complications: E11.9

## 2012-07-13 LAB — BASIC METABOLIC PANEL
BUN: 33 mg/dL — ABNORMAL HIGH (ref 6–23)
CO2: 29 mEq/L (ref 19–32)
Calcium: 8.7 mg/dL (ref 8.4–10.5)
Chloride: 98 mEq/L (ref 96–112)
Creatinine, Ser: 1.83 mg/dL — ABNORMAL HIGH (ref 0.50–1.10)
GFR calc Af Amer: 30 mL/min — ABNORMAL LOW (ref >90)
GFR calc non Af Amer: 26 mL/min — ABNORMAL LOW (ref >90)
Glucose, Bld: 104 mg/dL — ABNORMAL HIGH (ref 70–99)
Potassium: 3.7 mEq/L (ref 3.5–5.1)
Sodium: 136 mEq/L (ref 135–145)

## 2012-07-13 LAB — CBC WITH DIFFERENTIAL/PLATELET
Basophils Absolute: 0.1 10*3/uL (ref 0.0–0.1)
Basophils Relative: 1 % (ref 0–1)
Eosinophils Absolute: 0.2 10*3/uL (ref 0.0–0.7)
Eosinophils Relative: 2 % (ref 0–5)
HCT: 40.6 % (ref 36.0–46.0)
Hemoglobin: 13.4 g/dL (ref 12.0–15.0)
Lymphocytes Relative: 29 % (ref 12–46)
Lymphs Abs: 2.2 10*3/uL (ref 0.7–4.0)
MCH: 28.1 pg (ref 26.0–34.0)
MCHC: 33 g/dL (ref 30.0–36.0)
MCV: 85.1 fL (ref 78.0–100.0)
Monocytes Absolute: 0.5 10*3/uL (ref 0.1–1.0)
Monocytes Relative: 6 % (ref 3–12)
Neutro Abs: 4.7 10*3/uL (ref 1.7–7.7)
Neutrophils Relative %: 62 % (ref 43–77)
Platelets: 158 10*3/uL (ref 150–400)
RBC: 4.77 MIL/uL (ref 3.87–5.11)
RDW: 13.4 % (ref 11.5–15.5)
WBC: 7.6 10*3/uL (ref 4.0–10.5)

## 2012-07-13 LAB — MRSA PCR SCREENING: MRSA by PCR: NEGATIVE

## 2012-07-13 LAB — GLUCOSE, CAPILLARY
Glucose-Capillary: >600 mg/dL (ref 70–99)
Glucose-Capillary: >600 mg/dL (ref 70–99)
Glucose-Capillary: 382 mg/dL — ABNORMAL HIGH (ref 70–99)
Glucose-Capillary: 328 mg/dL — ABNORMAL HIGH (ref 70–99)
Glucose-Capillary: 370 mg/dL — ABNORMAL HIGH (ref 70–99)
Glucose-Capillary: 221 mg/dL — ABNORMAL HIGH (ref 70–99)
Glucose-Capillary: 103 mg/dL — ABNORMAL HIGH (ref 70–99)
Glucose-Capillary: 148 mg/dL — ABNORMAL HIGH (ref 70–99)
Glucose-Capillary: 104 mg/dL — ABNORMAL HIGH (ref 70–99)

## 2012-07-13 LAB — COMPREHENSIVE METABOLIC PANEL
ALT: 17 U/L (ref 0–35)
AST: 16 U/L (ref 0–37)
Albumin: 3.7 g/dL (ref 3.5–5.2)
Alkaline Phosphatase: 99 U/L (ref 39–117)
BUN: 35 mg/dL — ABNORMAL HIGH (ref 6–23)
CO2: 29 mEq/L (ref 19–32)
Calcium: 9.7 mg/dL (ref 8.4–10.5)
Chloride: 83 mEq/L — ABNORMAL LOW (ref 96–112)
Creatinine, Ser: 1.93 mg/dL — ABNORMAL HIGH (ref 0.50–1.10)
GFR calc Af Amer: 28 mL/min — ABNORMAL LOW (ref >90)
GFR calc non Af Amer: 24 mL/min — ABNORMAL LOW (ref >90)
Glucose, Bld: 790 mg/dL (ref 70–99)
Potassium: 4.1 mEq/L (ref 3.5–5.1)
Sodium: 127 mEq/L — ABNORMAL LOW (ref 135–145)
Total Bilirubin: 0.6 mg/dL (ref 0.3–1.2)
Total Protein: 7.5 g/dL (ref 6.0–8.3)

## 2012-07-13 LAB — URINALYSIS, ROUTINE W REFLEX MICROSCOPIC
Bilirubin Urine: NEGATIVE
Glucose, UA: >1000 mg/dL — AB
Hgb urine dipstick: NEGATIVE
Ketones, ur: NEGATIVE mg/dL
Leukocytes, UA: NEGATIVE
Nitrite: NEGATIVE
Protein, ur: NEGATIVE mg/dL
Specific Gravity, Urine: 1.005 (ref 1.005–1.030)
Urobilinogen, UA: 0.2 mg/dL (ref 0.0–1.0)
pH: 7 (ref 5.0–8.0)

## 2012-07-13 LAB — PHOSPHORUS: Phosphorus: 2.2 mg/dL — ABNORMAL LOW (ref 2.3–4.6)

## 2012-07-13 LAB — URINE MICROSCOPIC-ADD ON

## 2012-07-13 LAB — TROPONIN I: Troponin I: <0.3 ng/mL (ref <0.30)

## 2012-07-13 LAB — KETONES, QUALITATIVE: Acetone, Bld: NEGATIVE

## 2012-07-13 LAB — LACTIC ACID, PLASMA: Lactic Acid, Venous: 4.9 mmol/L — ABNORMAL HIGH (ref 0.5–2.2)

## 2012-07-13 LAB — MAGNESIUM: Magnesium: 2 mg/dL (ref 1.5–2.5)

## 2012-07-13 MED ORDER — ASPIRIN EC 81 MG PO TBEC
81.0000 mg | DELAYED_RELEASE_TABLET | Freq: Every day | ORAL | Status: DC
Start: 2012-07-13 — End: 2012-07-15
  Administered 2012-07-13 – 2012-07-15 (×3): 81 mg via ORAL
  Filled 2012-07-13 (×3): qty 1

## 2012-07-13 MED ORDER — SODIUM CHLORIDE 0.9 % IV SOLN
INTRAVENOUS | Status: DC
  Administered 2012-07-13: 5.4 [IU]/h via INTRAVENOUS
  Filled 2012-07-13: qty 1

## 2012-07-13 MED ORDER — METOPROLOL TARTRATE 1 MG/ML IV SOLN
5.0000 mg | Freq: Once | INTRAVENOUS | Status: AC
  Administered 2012-07-13: 5 mg via INTRAVENOUS

## 2012-07-13 MED ORDER — ATORVASTATIN CALCIUM 40 MG PO TABS
40.0000 mg | ORAL_TABLET | Freq: Every day | ORAL | Status: DC
  Administered 2012-07-13 – 2012-07-15 (×3): 40 mg via ORAL
  Filled 2012-07-13 (×3): qty 1

## 2012-07-13 MED ORDER — SODIUM CHLORIDE 0.9 % IV SOLN
INTRAVENOUS | Status: DC
  Filled 2012-07-13: qty 1

## 2012-07-13 MED ORDER — METOPROLOL TARTRATE 1 MG/ML IV SOLN
INTRAVENOUS | Status: AC
  Filled 2012-07-13: qty 5

## 2012-07-13 MED ORDER — ONDANSETRON HCL 4 MG/2ML IJ SOLN: 4.0000 mg | Freq: Four times a day (QID) | INTRAMUSCULAR | Status: DC | PRN

## 2012-07-13 MED ORDER — ONDANSETRON HCL 4 MG PO TABS: 4.0000 mg | ORAL_TABLET | Freq: Four times a day (QID) | ORAL | Status: DC | PRN

## 2012-07-13 MED ORDER — SODIUM CHLORIDE 0.9 % IJ SOLN
3.0000 mL | Freq: Two times a day (BID) | INTRAMUSCULAR | Status: DC
  Administered 2012-07-14 – 2012-07-15 (×3): 3 mL via INTRAVENOUS

## 2012-07-13 MED ORDER — HYDROCODONE-ACETAMINOPHEN 5-325 MG PO TABS
1.0000 | ORAL_TABLET | ORAL | Status: DC | PRN
  Administered 2012-07-14 – 2012-07-15 (×2): 1 via ORAL
  Filled 2012-07-13 (×2): qty 1

## 2012-07-13 MED ORDER — SODIUM CHLORIDE 0.9 % IV SOLN
INTRAVENOUS | Status: DC
  Administered 2012-07-13: 14:00:00 via INTRAVENOUS

## 2012-07-13 MED ORDER — ACETAMINOPHEN 325 MG PO TABS
650.0000 mg | ORAL_TABLET | Freq: Four times a day (QID) | ORAL | Status: DC | PRN
  Administered 2012-07-13 – 2012-07-15 (×2): 650 mg via ORAL
  Filled 2012-07-13 (×2): qty 2

## 2012-07-13 MED ORDER — ENOXAPARIN SODIUM 30 MG/0.3ML ~~LOC~~ SOLN
30.0000 mg | SUBCUTANEOUS | Status: DC
  Administered 2012-07-13: 30 mg via SUBCUTANEOUS
  Filled 2012-07-13: qty 0.3

## 2012-07-13 MED ORDER — POTASSIUM CHLORIDE IN NACL 20-0.9 MEQ/L-% IV SOLN
INTRAVENOUS | Status: DC
  Administered 2012-07-13 – 2012-07-15 (×3): via INTRAVENOUS

## 2012-07-13 MED ORDER — METOPROLOL TARTRATE 25 MG PO TABS
25.0000 mg | ORAL_TABLET | Freq: Two times a day (BID) | ORAL | Status: DC
  Administered 2012-07-13 – 2012-07-15 (×4): 25 mg via ORAL
  Filled 2012-07-13 (×4): qty 1

## 2012-07-13 MED ORDER — MAGNESIUM HYDROXIDE 400 MG/5ML PO SUSP: 30.0000 mL | Freq: Every day | ORAL | Status: DC | PRN

## 2012-07-13 MED ORDER — BENZONATATE 100 MG PO CAPS: 100.0000 mg | ORAL_CAPSULE | Freq: Three times a day (TID) | ORAL | Status: DC | PRN

## 2012-07-13 MED ORDER — ACETAMINOPHEN 650 MG RE SUPP: 650.0000 mg | Freq: Four times a day (QID) | RECTAL | Status: DC | PRN

## 2012-07-13 MED ORDER — INSULIN ASPART 100 UNIT/ML ~~LOC~~ SOLN
0.0000 [IU] | Freq: Every day | SUBCUTANEOUS | Status: DC
  Administered 2012-07-14: 2 [IU] via SUBCUTANEOUS

## 2012-07-13 MED ORDER — ALUM & MAG HYDROXIDE-SIMETH 200-200-20 MG/5ML PO SUSP: 30.0000 mL | Freq: Four times a day (QID) | ORAL | Status: DC | PRN

## 2012-07-13 MED ORDER — INSULIN ASPART 100 UNIT/ML ~~LOC~~ SOLN
0.0000 [IU] | Freq: Three times a day (TID) | SUBCUTANEOUS | Status: DC
Start: 2012-07-14 — End: 2012-07-15
  Administered 2012-07-14 (×2): 8 [IU] via SUBCUTANEOUS
  Administered 2012-07-14 – 2012-07-15 (×2): 3 [IU] via SUBCUTANEOUS
  Administered 2012-07-15: 5 [IU] via SUBCUTANEOUS

## 2012-07-13 MED ORDER — INSULIN ASPART 100 UNIT/ML ~~LOC~~ SOLN
6.0000 [IU] | Freq: Once | SUBCUTANEOUS | Status: AC
  Administered 2012-07-13: 6 [IU] via SUBCUTANEOUS
  Filled 2012-07-13: qty 1

## 2012-07-13 MED ORDER — SODIUM CHLORIDE 0.9 % IV BOLUS (SEPSIS)
1000.0000 mL | Freq: Once | INTRAVENOUS | Status: AC
  Administered 2012-07-13: 1000 mL via INTRAVENOUS

## 2012-07-13 NOTE — ED Notes (Signed)
Pt reports trouble walking with weakness of the left leg. Pt denies SOB and chest pain. Pt reports HA of 7/10. Denies any other weakness.

## 2012-07-13 NOTE — ED Notes (Signed)
Floor unable to take report at this time.

## 2012-07-13 NOTE — H&P (Addendum)
Hospital Admission Note Date: 07/13/2012  Patient name: Chelsea Meza Medical record number: [id number] Date of birth: [birth year] Age: 77 y.o. Gender: female PCP: Maggie Font, MD  Attending physician: No att. providers found  Chief Complaint: weakness and left leg tingling  History of Present Illness:  Chelsea Meza is an 77 y.o. female presents to the emergency room with worsening weakness. She feels that her legs "will give out" when she walks. She also developed tingling in her left leg. In the emergency room, she had a negative CAT scan of the brain, nonfocal neurologic exam, but a blood glucose of above 700. Since getting IV fluid and insulin, she reports that her weakness and paresthesias have improved. She was able to walk on her own to her bed. She was started on IV fluid and insulin drip. She takes oral hypoglycemics, but not insulin. She never checks her blood glucose. She was seen in Dr. Cathey Endow office about a week ago. Her sugar was high at that time, but she does not know what the exact blood glucose was noted to be. Her diabetes medications were not changed. Apparently, he adjusted her pressure medications. She has had polyuria, polydipsia and blurred vision. She has had a cough for the past few weeks. No fevers or chills. No dysuria.  Past Medical History  Diagnosis Date  . Diabetes mellitus without complication   . Hypertension    history of SVT  Meds: Prescriptions prior to admission  Medication Sig Dispense Refill  . aspirin EC 81 MG tablet Take 81 mg by mouth daily.      Marland Kitchen atorvastatin (LIPITOR) 40 MG tablet Take 40 mg by mouth daily.      . benzonatate (TESSALON) 100 MG capsule Take 100 mg by mouth 3 (three) times daily as needed for cough.      . Olmesartan-Amlodipine-HCTZ (TRIBENZOR) 40-5-25 MG TABS Take by mouth.      . pioglitazone-metformin (ACTOPLUS MET) 15-850 MG per tablet Take 1 tablet by mouth daily.      .  metoprolol   25 mg by mouth twice a day          Allergies: Review of patient's allergies indicates no known allergies.  Social history: Patient is divorced. She is a retired Marine scientist. Denies smoking drinking.  Family history: Hypertension  Past Surgical History  Procedure Laterality Date  . Cholecystectomy      Review of Systems: Systems reviewed and as per HPI, otherwise negative.  Physical Exam: Blood pressure 116/75, pulse 98, temperature 98.3 F (36.8 C), temperature source Oral, resp. rate 18, height 5\' 8"  (1.727 m), weight 100.8 kg (222 lb 3.6 oz), SpO2 99.00%. BP 116/75  Pulse 98  Temp(Src) 98.3 F (36.8 C) (Oral)  Resp 18  Ht 5\' 8"  (1.727 m)  Wt 100.8 kg (222 lb 3.6 oz)  BMI 33.8 kg/m2  SpO2 99%  Telemetry: Patient has an irregular rhythm with periods of SVT and PVCs. Difficult to assess rhythm.  General Appearance:    Alert, cooperative, no distress, appears stated age.  eating lunch.   Head:    Normocephalic, without obvious abnormality, atraumatic  Eyes:    PERRL, conjunctiva/corneas clear, EOM's intact, fundi    benign, both eyes  Ears:    Normal TM's and external ear canals, both ears  Nose:   Nares normal, septum midline, mucosa normal, no drainage    or sinus tenderness  Throat:   Lips, mucosa, and tongue normal; teeth and gums normal  Neck:  Supple, symmetrical, trachea midline, no adenopathy;    thyroid:  no enlargement/tenderness/nodules; no carotid   bruit or JVD  Back:     Symmetric, no curvature, ROM normal, no CVA tenderness  Lungs:     Clear to auscultation bilaterally, respirations unlabored  Chest Wall:    No tenderness or deformity   Heart:    irregular. No murmurs gallops or rubs      Abdomen:     Soft, non-tender, bowel sounds active all four quadrants,    no masses, no organomegaly  Genitalia:   deferred   Rectal:   deferred   Extremities:   Extremities normal, atraumatic, no cyanosis or edema  Pulses:   2+ and symmetric all extremities  Skin:   Skin color, texture, turgor normal,  no rashes or lesions  Lymph nodes:   Cervical, supraclavicular, and axillary nodes normal  Neurologic:   CNII-XII intact, normal strength, sensation and reflexes    throughout    Psychiatric: Normal affect.  Lab results: Basic Metabolic Panel:  Recent Labs  07/13/12 1141  NA 127*  K 4.1  CL 83*  CO2 29  GLUCOSE 790*  BUN 35*  CREATININE 1.93*  CALCIUM 9.7   Liver Function Tests:  Recent Labs  07/13/12 1141  AST 16  ALT 17  ALKPHOS 99  BILITOT 0.6  PROT 7.5  ALBUMIN 3.7   No results found for this basename: LIPASE, AMYLASE,  in the last 72 hours No results found for this basename: AMMONIA,  in the last 72 hours CBC:  Recent Labs  07/13/12 1141  WBC 7.6  NEUTROABS 4.7  HGB 13.4  HCT 40.6  MCV 85.1  PLT 158   Cardiac Enzymes:  Recent Labs  07/13/12 1141  TROPONINI <0.30   CBG:  Recent Labs  07/13/12 1114 07/13/12 1237  GLUCAP >600* >600*    Recent Labs  07/13/12 1236  COLORURINE YELLOW  LABSPEC 1.005  PHURINE 7.0  GLUCOSEU >1000*  HGBUR NEGATIVE  BILIRUBINUR NEGATIVE  KETONESUR NEGATIVE  PROTEINUR NEGATIVE  UROBILINOGEN 0.2  NITRITE NEGATIVE  LEUKOCYTESUR NEGATIVE   Imaging results:  Ct Head Wo Contrast  07/13/2012  *RADIOLOGY REPORT*  Clinical Data: Weakness.  Altered level of consciousness.  CT HEAD WITHOUT CONTRAST  Technique:  Contiguous axial images were obtained from the base of the skull through the vertex without contrast.  Comparison: None.  Findings: No mass lesion, mass effect, midline shift, hydrocephalus, hemorrhage.  No territorial ischemia or acute infarction.  Age appropriate atrophy.  Intracranial atherosclerosis.  Calvarium intact.  Paranasal sinuses appear within normal limits.  IMPRESSION: No acute intracranial abnormality.   Original Report Authenticated By: Dereck Ligas, M.D.    EKG: Normal sinus rhythm Moderate voltage criteria for LVH, may be normal variant  Assessment & Plan: Principal Problem:   DKA, type  2: Patient's bicarbonate is normal, but she has an anion gap of 15. This could be DKA versus mild lactic acidosis. She is on metformin. Will check acetone and lactate levels. Continue IV fluids and insulin drip. Serial basic metabolic panels. Step down monitoring. will likely need insulin at home. Stop metformin. 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 We'll check hemoglobin is A1c and get a diabetic educator consult. Needs to monitor blood glucose at home which she has not been doing. Will need to learn self insulin injection.    Acute renal failure: Likely secondary to above and dehydration: Monitor.     PSVT (paroxysmal  supraventricular tachycardia): Patient has a history of this in the past and had a brief episode of SVT in the step down unit. Will resume metoprolol and check magnesium and TSH. Rhythm on the monitor is indeterminate. Will repeat EKG.     Weakness of both legs likely secondary to above. No evidence of focal weakness. CT brain negative. Will monitor with neuro checks, but doubt acute stroke.     Paresthesia of left leg: Likely related to above.    Cough: Will check PA and lateral chest x-ray to rule out pneumonia  Hypertension: Resume metoprolol but hold other antihypertensives for now.   [place] L 07/13/2012, 4:07 PM

## 2012-07-13 NOTE — ED Notes (Signed)
CRITICAL VALUE ALERT  Critical value received:  Glucose 790  Date of notification: 07/13/12  Time of notification:  12:29 pm   Critical value read back:yes  Nurse who received alert:  Graciela Husbands   MD notified (1st page):  [place]  Time of first page:  12:29  MD notified (2nd page):  Time of second page:  Responding MD:    Time MD responded:

## 2012-07-13 NOTE — ED Provider Notes (Signed)
History     This chart was scribed for Chelsea Speak, MD, MD by Rhae Lerner, ED Scribe. The patient was seen in room APA01/APA01 and the patient's care was started at 11:31AM.   CSN: [id number]  Arrival date & time 07/13/12  1058      Chief Complaint  Patient presents with  . Extremity Weakness     The history is provided by the patient and medical records. No language interpreter was used.   Chelsea Meza is a 77 y.o. female with hx of DM (diagnosed 10 years ago) and HTN who presents to the Emergency Department complaining of constant, weakness and trouble moving left leg onset 1 day ago. She states that she does not have pain in left leg. She mentions her left leg feels like it is going to "give out" when she stands. She reports that she was seen by PCP and had elevated BP and CBG (current CBG in ED is over 600). She reports that she was suppose to see her PCP today. She denies hx of stroke. She reports that she had a moderate, headache and blurred vision. Her headache is rated 7/10. She reports having increased thirst and urinary frequency. She takes a  She denies weakness in upper extremities, numbness, facial droop, fever, chills, nausea, vomiting, diarrhea, weakness, cough, SOB and any other pain.    PCP is Dr. Berdine Addison    Past Medical History  Diagnosis Date  . Diabetes mellitus without complication   . Hypertension     Past Surgical History  Procedure Laterality Date  . Cholecystectomy      No family history on file.  History  Substance Use Topics  . Smoking status: Not on file  . Smokeless tobacco: Not on file  . Alcohol Use: No    OB History   Grav Para Term Preterm Abortions TAB SAB Ect Mult Living                  Review of Systems  Constitutional: Negative for fever and chills.  Eyes: Positive for visual disturbance.  Respiratory: Negative for cough and shortness of breath.   Gastrointestinal: Negative for nausea, vomiting and diarrhea.   Neurological: Positive for headaches. Negative for syncope, weakness, light-headedness and numbness.  All other systems reviewed and are negative.    Allergies  Review of patient's allergies indicates not on file.  Home Medications  No current outpatient prescriptions on file.  BP 161/82  Pulse 98  Temp(Src) 98.3 F (36.8 C) (Oral)  Ht 5\' 8"  (1.727 m)  Wt 231 lb (104.781 kg)  BMI 35.13 kg/m2  SpO2 96%  Physical Exam  Nursing note and vitals reviewed. Constitutional: She is oriented to person, place, and time. She appears well-developed and well-nourished. No distress.  HENT:  Head: Normocephalic and atraumatic.  Mucous membranes are dry   Eyes: EOM are normal. Pupils are equal, round, and reactive to light.  Pulmonary/Chest: Effort normal. No respiratory distress.  Neurological: She is alert and oriented to person, place, and time. She has normal strength. No cranial nerve deficit or sensory deficit. Coordination normal.  Skin: Skin is warm and dry.  Psychiatric: She has a normal mood and affect. Her behavior is normal.    ED Course  Procedures (including critical care time) DIAGNOSTIC STUDIES: Oxygen Saturation is 96% on room air, adequate by my interpretation.    COORDINATION OF CARE: 11:36 AM Discussed ED treatment with pt and pt agrees.     Labs Reviewed  GLUCOSE, CAPILLARY - Abnormal; Notable for the following:    Glucose-Capillary >600 (*)    All other components within normal limits  COMPREHENSIVE METABOLIC PANEL - Abnormal; Notable for the following:    Sodium 127 (*)    Chloride 83 (*)    Glucose, Bld 790 (*)    BUN 35 (*)    Creatinine, Ser 1.93 (*)    GFR calc non Af Amer 24 (*)    GFR calc Af Amer 28 (*)    All other components within normal limits  URINALYSIS, ROUTINE W REFLEX MICROSCOPIC - Abnormal; Notable for the following:    Glucose, UA >1000 (*)    All other components within normal limits  GLUCOSE, CAPILLARY - Abnormal; Notable for the  following:    Glucose-Capillary >600 (*)    All other components within normal limits  URINE MICROSCOPIC-ADD ON - Abnormal; Notable for the following:    Squamous Epithelial / LPF FEW (*)    All other components within normal limits  CBC WITH DIFFERENTIAL  TROPONIN I   Ct Head Wo Contrast  07/13/2012  *RADIOLOGY REPORT*  Clinical Data: Weakness.  Altered level of consciousness.  CT HEAD WITHOUT CONTRAST  Technique:  Contiguous axial images were obtained from the base of the skull through the vertex without contrast.  Comparison: None.  Findings: No mass lesion, mass effect, midline shift, hydrocephalus, hemorrhage.  No territorial ischemia or acute infarction.  Age appropriate atrophy.  Intracranial atherosclerosis.  Calvarium intact.  Paranasal sinuses appear within normal limits.  IMPRESSION: No acute intracranial abnormality.   Original Report Authenticated By: Dereck Ligas, M.D.      No diagnosis found.   Date: 07/13/2012  Rate: 90  Rhythm: normal sinus rhythm  QRS Axis: normal  Intervals: normal  ST/T Wave abnormalities: normal  Conduction Disutrbances:none  Narrative Interpretation:   Old EKG Reviewed: unchanged    MDM  The patient presents with numbness of the left leg and headache.  She was found to have a glucose of 790 with acute renal failure.  She was started on the glucose stabilizer and medicine has been consulted for admission.  A head ct was obtained which was negative for cva.  I suspect the leg numbness is likely a result of the elevated sugar.      I personally performed the services described in this documentation, which was scribed in my presence. The recorded information has been reviewed and is accurate.      Chelsea Speak, MD 07/13/12 [phone]

## 2012-07-14 ENCOUNTER — Encounter (HOSPITAL_COMMUNITY): Payer: Self-pay

## 2012-07-14 DIAGNOSIS — E872 Acidosis, unspecified: Secondary | ICD-10-CM | POA: Diagnosis present

## 2012-07-14 DIAGNOSIS — E1100 Type 2 diabetes mellitus with hyperosmolarity without nonketotic hyperglycemic-hyperosmolar coma (NKHHC): Secondary | ICD-10-CM | POA: Diagnosis present

## 2012-07-14 LAB — BASIC METABOLIC PANEL
BUN: 33 mg/dL — ABNORMAL HIGH (ref 6–23)
BUN: 30 mg/dL — ABNORMAL HIGH (ref 6–23)
CO2: 25 mEq/L (ref 19–32)
CO2: 23 mEq/L (ref 19–32)
Calcium: 8.6 mg/dL (ref 8.4–10.5)
Calcium: 8 mg/dL — ABNORMAL LOW (ref 8.4–10.5)
Chloride: 97 mEq/L (ref 96–112)
Chloride: 97 mEq/L (ref 96–112)
Creatinine, Ser: 1.85 mg/dL — ABNORMAL HIGH (ref 0.50–1.10)
Creatinine, Ser: 1.66 mg/dL — ABNORMAL HIGH (ref 0.50–1.10)
GFR calc Af Amer: 30 mL/min — ABNORMAL LOW (ref >90)
GFR calc Af Amer: 34 mL/min — ABNORMAL LOW (ref >90)
GFR calc non Af Amer: 26 mL/min — ABNORMAL LOW (ref >90)
GFR calc non Af Amer: 29 mL/min — ABNORMAL LOW (ref >90)
Glucose, Bld: 217 mg/dL — ABNORMAL HIGH (ref 70–99)
Glucose, Bld: 248 mg/dL — ABNORMAL HIGH (ref 70–99)
Potassium: 3.9 mEq/L (ref 3.5–5.1)
Potassium: 3.7 mEq/L (ref 3.5–5.1)
Sodium: 135 mEq/L (ref 135–145)
Sodium: 134 mEq/L — ABNORMAL LOW (ref 135–145)

## 2012-07-14 LAB — GLUCOSE, CAPILLARY
Glucose-Capillary: 175 mg/dL — ABNORMAL HIGH (ref 70–99)
Glucose-Capillary: 186 mg/dL — ABNORMAL HIGH (ref 70–99)
Glucose-Capillary: 218 mg/dL — ABNORMAL HIGH (ref 70–99)
Glucose-Capillary: 251 mg/dL — ABNORMAL HIGH (ref 70–99)
Glucose-Capillary: 267 mg/dL — ABNORMAL HIGH (ref 70–99)

## 2012-07-14 LAB — TSH: TSH: 2.486 u[IU]/mL (ref 0.350–4.500)

## 2012-07-14 LAB — HEMOGLOBIN A1C
Hgb A1c MFr Bld: 16 % — ABNORMAL HIGH (ref <5.7)
Mean Plasma Glucose: 413 mg/dL — ABNORMAL HIGH (ref <117)

## 2012-07-14 LAB — MAGNESIUM: Magnesium: 1.8 mg/dL (ref 1.5–2.5)

## 2012-07-14 LAB — TROPONIN I: Troponin I: <0.3 ng/mL (ref <0.30)

## 2012-07-14 MED ORDER — INSULIN GLARGINE 100 UNIT/ML ~~LOC~~ SOLN
15.0000 [IU] | Freq: Every day | SUBCUTANEOUS | Status: DC
  Administered 2012-07-14: 15 [IU] via SUBCUTANEOUS

## 2012-07-14 MED ORDER — PIOGLITAZONE HCL 30 MG PO TABS
30.0000 mg | ORAL_TABLET | Freq: Every day | ORAL | Status: DC
  Administered 2012-07-14 – 2012-07-15 (×2): 30 mg via ORAL
  Filled 2012-07-14 (×2): qty 1

## 2012-07-14 MED ORDER — ADENOSINE 6 MG/2ML IV SOLN
INTRAVENOUS | Status: AC
  Filled 2012-07-14: qty 2

## 2012-07-14 MED ORDER — ADENOSINE 6 MG/2ML IV SOLN
6.0000 mg | Freq: Once | INTRAVENOUS | Status: AC
  Administered 2012-07-14: 6 mg via INTRAVENOUS

## 2012-07-14 MED ORDER — GLIMEPIRIDE 2 MG PO TABS
4.0000 mg | ORAL_TABLET | Freq: Every day | ORAL | Status: DC
  Administered 2012-07-14 – 2012-07-15 (×2): 4 mg via ORAL
  Filled 2012-07-14 (×4): qty 2

## 2012-07-14 MED ORDER — VERAPAMIL HCL 80 MG PO TABS
80.0000 mg | ORAL_TABLET | Freq: Three times a day (TID) | ORAL | Status: DC
  Administered 2012-07-14 – 2012-07-15 (×5): 80 mg via ORAL
  Filled 2012-07-14 (×5): qty 1

## 2012-07-14 MED ORDER — INSULIN GLARGINE 100 UNIT/ML ~~LOC~~ SOLN
15.0000 [IU] | Freq: Once | SUBCUTANEOUS | Status: AC
  Administered 2012-07-14: 15 [IU] via SUBCUTANEOUS

## 2012-07-14 MED ORDER — ENOXAPARIN SODIUM 60 MG/0.6ML ~~LOC~~ SOLN
50.0000 mg | SUBCUTANEOUS | Status: DC
  Administered 2012-07-14: 50 mg via SUBCUTANEOUS
  Filled 2012-07-14: qty 0.6

## 2012-07-14 NOTE — Progress Notes (Signed)
Pt's HR is in 130s-150s, ECG is done, MD is notified, orders received and initiated.

## 2012-07-14 NOTE — Progress Notes (Signed)
Admission documentation completed that was not completed yet from her admission on 3/15.  Still shows that consult needs have not been done, but I double checked and verified that it has been documented on.  I completed her Pt Education assessment and initiated her Care Plan that was not done on admission as well as gave her her pt admission packet.  I printed out Educational Handouts and went over them w/her and then placed them in her Pt Admission Packet folder.  Education Given:  Diabetic Ketoacidosis Diabetes Meal Planning Guide Monitoring for Diabetes How to Avoid Diabetes Problems Diets for Diabetes, Food Labeling Diabetes, Frequently Asked Questions Diabetes, Eating Away From Home Diabetic Nephropathy

## 2012-07-14 NOTE — Progress Notes (Addendum)
Overnight, had problems with sustained SVT. Given adenosine and started back on verapamil, which is what she was on previously. Also, insulin drip was stopped and patient was placed on NovoLog.  Subjective: Weakness and paresthesias resolved. Feels much better today.  Occasionally feels heart palpitations, but usually is asymptomatic with her SVT. Daughter reports that patient was taken off verapamil recently. The daughter also requests that patient not be discharged on insulin if at all possible. The patient herself has no objections to insulin however. Daughter reports that her blood sugar was high because "she had a cold and was drinking orange juice".  Objective: Vital signs in last 24 hours: Filed Vitals:   07/14/12 0200 07/14/12 0300 07/14/12 0400 07/14/12 0500  BP: 83/55 108/54 118/57   Pulse: 58 71 67   Temp:   98.3 F (36.8 C)   TempSrc:   Oral   Resp: 23 20 15    Height:      Weight:    107.5 kg (236 lb 15.9 oz)  SpO2: 100% 99% 99%    Weight change:   Intake/Output Summary (Last 24 hours) at 07/14/12 0815 Last data filed at 07/14/12 0400  Gross per 24 hour  Intake   1125 ml  Output    100 ml  Net   1025 ml   telemetry: Normal sinus rhythm  General: Comfortable. Eating breakfast. Lungs clear to auscultation bilaterally without wheeze rhonchi or rales Cardiovascular regular rate rhythm without murmurs gallops rubs Abdomen soft nontender nondistended Extremities no clubbing cyanosis or edema Neurologic: Cranial nerves intact. Strength 5 out of 5.  Lab Results: Basic Metabolic Panel:  Recent Labs Lab 07/13/12 1141 07/13/12 1640  07/14/12 0121 07/14/12 0453  NA 127*  --   < > 135 134*  K 4.1  --   < > 3.9 3.7  CL 83*  --   < > 97 97  CO2 29  --   < > 25 23  GLUCOSE 790*  --   < > 217* 248*  BUN 35*  --   < > 33* 30*  CREATININE 1.93*  --   < > 1.85* 1.66*  CALCIUM 9.7  --   < > 8.6 8.0*  MG  --  2.0  --  1.8  --   PHOS  --  2.2*  --   --   --   < > = values  in this interval not displayed. Liver Function Tests:  Recent Labs Lab 07/13/12 1141  AST 16  ALT 17  ALKPHOS 99  BILITOT 0.6  PROT 7.5  ALBUMIN 3.7   No results found for this basename: LIPASE, AMYLASE,  in the last 168 hours No results found for this basename: AMMONIA,  in the last 168 hours CBC:  Recent Labs Lab 07/13/12 1141  WBC 7.6  NEUTROABS 4.7  HGB 13.4  HCT 40.6  MCV 85.1  PLT 158   Cardiac Enzymes:  Recent Labs Lab 07/13/12 1141 07/14/12 0121  TROPONINI <0.30 <0.30   BNP: No results found for this basename: PROBNP,  in the last 168 hours D-Dimer: No results found for this basename: DDIMER,  in the last 168 hours CBG:  Recent Labs Lab 07/13/12 1720 07/13/12 1857 07/13/12 2005 07/13/12 2124 07/13/12 2240 07/14/12 0057  GLUCAP 370* 221* 103* 148* 104* 175*   Urinalysis:  Recent Labs Lab 07/13/12 1236  COLORURINE YELLOW  LABSPEC 1.005  PHURINE 7.0  GLUCOSEU >1000*  HGBUR NEGATIVE  BILIRUBINUR NEGATIVE  KETONESUR NEGATIVE  PROTEINUR NEGATIVE  UROBILINOGEN 0.2  NITRITE NEGATIVE  LEUKOCYTESUR NEGATIVE    Micro Results: Recent Results (from the past 240 hour(s))  MRSA PCR SCREENING     Status: None   Collection Time    07/13/12  7:35 PM      Result Value Range Status   MRSA by PCR NEGATIVE  NEGATIVE Final   Comment:            The GeneXpert MRSA Assay (FDA     approved for NASAL specimens     only), is one component of a     comprehensive MRSA colonization     surveillance program. It is not     intended to diagnose MRSA     infection nor to guide or     monitor treatment for     MRSA infections.   Studies/Results: Ct Head Wo Contrast  07/13/2012  *RADIOLOGY REPORT*  Clinical Data: Weakness.  Altered level of consciousness.  CT HEAD WITHOUT CONTRAST  Technique:  Contiguous axial images were obtained from the base of the skull through the vertex without contrast.  Comparison: None.  Findings: No mass lesion, mass effect,  midline shift, hydrocephalus, hemorrhage.  No territorial ischemia or acute infarction.  Age appropriate atrophy.  Intracranial atherosclerosis.  Calvarium intact.  Paranasal sinuses appear within normal limits.  IMPRESSION: No acute intracranial abnormality.   Original Report Authenticated By: Dereck Ligas, M.D.    Dg Chest Port 1 View  07/13/2012  *RADIOLOGY REPORT*  Clinical Data: Cough.  Diabetes  PORTABLE CHEST - 1 VIEW  Comparison: 07/29/2009  Findings: Mild cardiomegaly and ectasia of the thoracic aorta remains stable.  Both lungs are clear.  No evidence of pleural effusion.  No mass or lymphadenopathy identified.  IMPRESSION: Stable cardiomegaly.  No active lung disease.   Original Report Authenticated By: Earle Gell, M.D.    Scheduled Meds: . aspirin EC  81 mg Oral Daily  . atorvastatin  40 mg Oral Daily  . enoxaparin (LOVENOX) injection  30 mg Subcutaneous Q24H  . glimepiride  4 mg Oral Q breakfast  . insulin aspart  0-15 Units Subcutaneous TID WC  . insulin aspart  0-5 Units Subcutaneous QHS  . insulin glargine  15 Units Subcutaneous Once  . insulin glargine  15 Units Subcutaneous QHS  . metoprolol tartrate  25 mg Oral BID  . pioglitazone  30 mg Oral Daily  . sodium chloride  3 mL Intravenous Q12H  . verapamil  80 mg Oral TID   Continuous Infusions: . 0.9 % NaCl with KCl 20 mEq / L 125 mL/hr at 07/14/12 0457   PRN Meds:.acetaminophen, acetaminophen, alum & mag hydroxide-simeth, benzonatate, HYDROcodone-acetaminophen, magnesium hydroxide, ondansetron (ZOFRAN) IV, ondansetron Assessment/Plan:    Diabetic hyperosmolar non-ketotic state: Acetone negative. Patient's anion gap was due to lactic acidosis. Hemoglobin A1c is pending. Doubt that patient will be able to be discharged safely without insulin, but will try oral hypoglycemics. Patient should stay off metformin indefinitely due to her renal insufficiency and lactic acidosis. Will however resume Actos at a higher dose (30 mg)  and add Amaryl 4 mg. Will continue sliding scale NovoLog and add Lantus 10 units nightly. Patient will require diabetic teaching. Await hemoglobin A1c which may allow [place] to gauge more accurately what she may require at home. Will also consult dietitian for diabetic teaching.  Lactic acidosis on metformin:  Metformin stopped    DKA, type 2    Acute renal failure: monitor    PSVT (  paroxysmal supraventricular tachycardia): verapamil restarted.  Continue metoprolol. Previous history of same    Weakness of both legs resolved, secondary to above    Paresthesia of left leg resolved, no evidence of stroke or TIA    Cough: CXR negative    Essential hypertension, benign  Continue stepdown monitoring for now  Increase activity   LOS: 1 day   Chelsea Meza L 07/14/2012, 8:15 AM

## 2012-07-14 NOTE — Progress Notes (Signed)
Pharmacy Lovenox for VTE prophylaxis.  Patient Measurements: Height: 5\' 8"  (075-GRM cm) Weight: 236 lb 15.9 oz (107.5 kg) IBW/kg (Calculated) : 63.9 Body mass index is 36.04 kg/(m^2).   VITALS Filed Vitals:   07/14/12 1300  BP: 112/57  Pulse: 60  Temp:   Resp: 18    INR Last Three Days: No results found for this basename: INR,  in the last 72 hours  CBC:    Component Value Date/Time   WBC 7.6 07/13/2012 1141   RBC 4.77 07/13/2012 1141   HGB 13.4 07/13/2012 1141   HCT 40.6 07/13/2012 1141   PLT 158 07/13/2012 1141   MCV 85.1 07/13/2012 1141   MCH 28.1 07/13/2012 1141   MCHC 33.0 07/13/2012 1141   RDW 13.4 07/13/2012 1141   LYMPHSABS 2.2 07/13/2012 1141   MONOABS 0.5 07/13/2012 1141   EOSABS 0.2 07/13/2012 1141   BASOSABS 0.1 07/13/2012 1141    RENAL FUNCTION: Estimated Creatinine Clearance: 38.2 ml/min (by C-G formula based on Cr of 1.66).  Assessment: Current Dose = 30mg  daily. Renal Function has improved.  NOTE: Adjusted dose to 50mg  SQ daily due to improved renal function and increased BMI.  Pricilla Larsson, [facility] 07/14/2012 2:46 PM

## 2012-07-14 NOTE — Plan of Care (Signed)
Problem: Phase I Progression Outcomes Goal: Order "Choose to Live" book from Pharmacy Outcome: Progressing Tried to order but order doesn't come up when I enter it into EPIC order management, will transition to day shift that this book is needed from pharm

## 2012-07-14 NOTE — Plan of Care (Signed)
Problem: Consults Goal: Nutrition Consult-if indicated Outcome: Progressing ordered  Problem: Phase I Progression Outcomes Goal: Diabetes Coordinator Consult Outcome: Progressing ordered

## 2012-07-15 DIAGNOSIS — I10 Essential (primary) hypertension: Secondary | ICD-10-CM

## 2012-07-15 DIAGNOSIS — IMO0001 Reserved for inherently not codable concepts without codable children: Secondary | ICD-10-CM

## 2012-07-15 DIAGNOSIS — E872 Acidosis, unspecified: Secondary | ICD-10-CM

## 2012-07-15 DIAGNOSIS — E1101 Type 2 diabetes mellitus with hyperosmolarity with coma: Secondary | ICD-10-CM

## 2012-07-15 DIAGNOSIS — N179 Acute kidney failure, unspecified: Secondary | ICD-10-CM

## 2012-07-15 DIAGNOSIS — R209 Unspecified disturbances of skin sensation: Secondary | ICD-10-CM

## 2012-07-15 LAB — COMPREHENSIVE METABOLIC PANEL
ALT: 14 U/L (ref 0–35)
AST: 16 U/L (ref 0–37)
Albumin: 3.2 g/dL — ABNORMAL LOW (ref 3.5–5.2)
Alkaline Phosphatase: 74 U/L (ref 39–117)
BUN: 22 mg/dL (ref 6–23)
CO2: 26 mEq/L (ref 19–32)
Calcium: 8.8 mg/dL (ref 8.4–10.5)
Chloride: 98 mEq/L (ref 96–112)
Creatinine, Ser: 1.22 mg/dL — ABNORMAL HIGH (ref 0.50–1.10)
GFR calc Af Amer: 49 mL/min — ABNORMAL LOW (ref >90)
GFR calc non Af Amer: 43 mL/min — ABNORMAL LOW (ref >90)
Glucose, Bld: 199 mg/dL — ABNORMAL HIGH (ref 70–99)
Potassium: 4.1 mEq/L (ref 3.5–5.1)
Sodium: 133 mEq/L — ABNORMAL LOW (ref 135–145)
Total Bilirubin: 0.4 mg/dL (ref 0.3–1.2)
Total Protein: 6.7 g/dL (ref 6.0–8.3)

## 2012-07-15 LAB — GLUCOSE, CAPILLARY
Glucose-Capillary: 192 mg/dL — ABNORMAL HIGH (ref 70–99)
Glucose-Capillary: 238 mg/dL — ABNORMAL HIGH (ref 70–99)

## 2012-07-15 MED ORDER — HYDROCODONE-ACETAMINOPHEN 5-325 MG PO TABS: 1.0000 | ORAL_TABLET | ORAL | Status: DC | PRN

## 2012-07-15 MED ORDER — GLIMEPIRIDE 4 MG PO TABS: 4.0000 mg | ORAL_TABLET | Freq: Every day | ORAL | Status: DC

## 2012-07-15 MED ORDER — VERAPAMIL HCL 80 MG PO TABS: 80.0000 mg | ORAL_TABLET | Freq: Three times a day (TID) | ORAL | Status: DC

## 2012-07-15 MED ORDER — PIOGLITAZONE HCL 30 MG PO TABS: 30.0000 mg | ORAL_TABLET | Freq: Every day | ORAL | Status: DC

## 2012-07-15 NOTE — Progress Notes (Signed)
UR Chart Review Completed  

## 2012-07-15 NOTE — Care Management Note (Signed)
    Page 1 of 1   07/15/2012     3:27:23 PM   CARE MANAGEMENT NOTE 07/15/2012  Patient:  Chelsea Meza, Chelsea Meza   Account Number:  [id number]  Date Initiated:  07/15/2012  Documentation initiated by:  Claretha Cooper  Subjective/Objective Assessment:   Pt from home. To DC today with no HH needs identified. Has walker at home.     Action/Plan:   Anticipated DC Date:  07/15/2012   Anticipated DC Plan:  HOME/SELF CARE      DC Planning Services  CM consult      Choice offered to / List presented to:             Status of service:  Completed, signed off Medicare Important Message given?  NA - LOS <3 / Initial given by admissions (If response is "NO", the following Medicare IM given date fields will be blank) Date Medicare IM given:   Date Additional Medicare IM given:    Discharge Disposition:  HOME/SELF CARE  Per UR Regulation:    If discussed at Long Length of Stay Meetings, dates discussed:    Comments:  07/15/12 Claretha Cooper RN BSN CM

## 2012-07-15 NOTE — Progress Notes (Signed)
Inpatient Diabetes Program Recommendations  AACE/ADA: New Consensus Statement on Inpatient Glycemic Control (2013)  Target Ranges:  Prepandial:   less than 140 mg/dL      Peak postprandial:   less than 180 mg/dL (1-2 hours)      Critically ill patients:  140 - 180 mg/dL   Reason for Visit: Consult   Note: Spoke with pt about diabetes. Discussed A1C results (16%) with her and explained what an A1C is, basic pathophysiology of DM Type 2, basic home care, importance of checking CBGs and maintaining good CBG control to prevent long-term and short-term complications. Reviewed signs and symptoms of hyperglycemia and hypoglycemia along with proper treatment.  Patient states that she has a glucometer at home that was given to her by Dr. Cathey Endow office last week.  Even though she was given the glucometer she did not check her blood sugars at home.  Reviewed how to monitor glucose with glucometer and patient is able to verbalize teach back of using glucometer and technique to use.  Have asked patient to check her blood sugar at least four times a day (before breakfast, before lunch, before supper, and at bedtime) and keep a record of them and take them with her to her follow appointment with Dr. Berdine Addison.  Patient verbalizes understanding and states that she can do that and she will do as I have asked.  In talking with the patient about following a diabetic diet, she states that she was drinking regular sodas and a lot of orange juice.  Informed patient how to read food labels and discussed how carbohydrates are turned into sugar in the body and how they impact blood glucose levels.  Patient verbalized understanding and states that she plans to change her diet and start drinking more water and diet sodas.  Provided patient with Living Well with Diabetes booklet and a tracker book so that she can write down A1C levels and blood glucose results.  RNs to provide ongoing basic DM education at bedside with  patient.  Thanks, Barnie Alderman, RN, BSN, [place] Diabetes Coordinator Inpatient Diabetes Program [phone]

## 2012-07-15 NOTE — Progress Notes (Signed)
Prescription faxed to [facility] ?

## 2012-07-15 NOTE — Discharge Summary (Signed)
Physician Discharge Summary  Chelsea Meza [id number] DOB: 1935/12/07 DOA: 07/13/2012  PCP: Maggie Font, MD  Admit date: 07/13/2012 Discharge date: 07/15/2012  Time spent: Greater than 30 minutes  Recommendations for Outpatient Follow-up:  1. Follow up primary care physician within one week.   Discharge Diagnoses:  1. Hyperosmolar nonketotic diabetic state. 2. Lactic acidosis secondary to metformin, metformin has been discontinued indefinitely. 3. Acute renal failure secondary to dehydration, secondary to #1, resolved. 4. Paroxysmal supraventricular tachycardia. 5. Paresthesia of the left leg, likely secondary to uncontrolled diabetes. 6. Hypertension.   Discharge Condition: Stable and improving.  Diet recommendation: Carbohydrate modified diet.  Filed Weights   07/13/12 1109 07/13/12 1432 07/14/12 0500  Weight: 104.781 kg (231 lb) 100.8 kg (222 lb 3.6 oz) 107.5 kg (236 lb 15.9 oz)    History of present illness:  This 77 lady presented to the hospital with symptoms of weakness and left leg tingling. Please see initial history as outlined below: Chelsea Meza is an 77 y.o. female presents to the emergency room with worsening weakness. She feels that her legs "will give out" when she walks. She also developed tingling in her left leg. In the emergency room, she had a negative CAT scan of the brain, nonfocal neurologic exam, but a blood glucose of above 700. Since getting IV fluid and insulin, she reports that her weakness and paresthesias have improved. She was able to walk on her own to her bed. She was started on IV fluid and insulin drip. She takes oral hypoglycemics, but not insulin. She never checks her blood glucose. She was seen in Dr. Cathey Endow office about a week ago. Her sugar was high at that time, but she does not know what the exact blood glucose was noted to be. Her diabetes medications were not changed. Apparently, he adjusted her pressure medications. She has had  polyuria, polydipsia and blurred vision. She has had a cough for the past few weeks. No fevers or chills. No dysuria.  Hospital Course:  The patient had significantly elevated glucose above 700. She was treated with intravenous fluids, intravenous insulin. She made a good and steady improvement. She did have an acidosis with an anion gap and it was secondary to lactic acidosis. This was felt to be secondary to the metformin she was taken. Therefore metformin has been discontinued. Her hemoglobin A1c was significantly  elevated at 16. The patient did not wish to go onto insulin, therefore medication adjustments have been made. She has been started on Amaryl in addition to Actos. Metformin has been discontinued. She now feels back to her baseline and her creatinine has come back to a improved and almost normal range. She is eating and drinking well. Her glucose is much improved. She stable for discharge and will followup as soon, within a week, with her primary care physician.  Procedures:  None.   Consultations:  None.  Discharge Exam: Filed Vitals:   07/15/12 0400 07/15/12 0500 07/15/12 0600 07/15/12 0800  BP: 153/73 151/77 129/76   Pulse: 55 52 52   Temp: 97.8 F (36.6 C)   97.5 F (36.4 C)  TempSrc: Oral   Oral  Resp: 16 19 15    Height:      Weight:      SpO2: 100% 95% 97%     General: She looks systemically well. She is not toxic or septic. Cardiovascular: Heart sounds are present and normal without murmurs or gallop rhythms. Respiratory: Lung fields are clear. She is alert  and orientated.  Discharge Instructions  Discharge Orders   Future Orders Complete By Expires     Diet - low sodium heart healthy  As directed     Increase activity slowly  As directed         Medication List    STOP taking these medications       pioglitazone-metformin 15-850 MG per tablet  Commonly known as:  ACTOPLUS MET     verapamil 240 MG (CO) 24 hr tablet  Commonly known as:  COVERA HS       TAKE these medications       aspirin EC 81 MG tablet  Take 81 mg by mouth daily.     atorvastatin 40 MG tablet  Commonly known as:  LIPITOR  Take 40 mg by mouth daily.     benzonatate 100 MG capsule  Commonly known as:  TESSALON  Take 100 mg by mouth 3 (three) times daily as needed for cough.     glimepiride 4 MG tablet  Commonly known as:  AMARYL  Take 1 tablet (4 mg total) by mouth daily with breakfast.     HYDROcodone-acetaminophen 5-325 MG per tablet  Commonly known as:  NORCO/VICODIN  Take 1-2 tablets by mouth every 4 (four) hours as needed.     metoprolol tartrate 25 MG tablet  Commonly known as:  LOPRESSOR  Take 25 mg by mouth 2 (two) times daily.     pioglitazone 30 MG tablet  Commonly known as:  ACTOS  Take 1 tablet (30 mg total) by mouth daily.     TRIBENZOR 40-5-25 MG Tabs  Generic drug:  Olmesartan-Amlodipine-HCTZ  Take by mouth.     verapamil 80 MG tablet  Commonly known as:  CALAN  Take 1 tablet (80 mg total) by mouth 3 (three) times daily.           Follow-up Information   Follow up with [facility], MD. Schedule an appointment as soon as possible for a visit in 1 week.   Contact information:   [place] [address] [phone]        The results of significant diagnostics from this hospitalization (including imaging, microbiology, ancillary and laboratory) are listed below for reference.    Significant Diagnostic Studies: Ct Head Wo Contrast  07/13/2012  *RADIOLOGY REPORT*  Clinical Data: Weakness.  Altered level of consciousness.  CT HEAD WITHOUT CONTRAST  Technique:  Contiguous axial images were obtained from the base of the skull through the vertex without contrast.  Comparison: None.  Findings: No mass lesion, mass effect, midline shift, hydrocephalus, hemorrhage.  No territorial ischemia or acute infarction.  Age appropriate atrophy.  Intracranial atherosclerosis.  Calvarium intact.  Paranasal sinuses  appear within normal limits.  IMPRESSION: No acute intracranial abnormality.   Original Report Authenticated By: Dereck Ligas, M.D.    Dg Chest Port 1 View  07/13/2012  *RADIOLOGY REPORT*  Clinical Data: Cough.  Diabetes  PORTABLE CHEST - 1 VIEW  Comparison: 07/29/2009  Findings: Mild cardiomegaly and ectasia of the thoracic aorta remains stable.  Both lungs are clear.  No evidence of pleural effusion.  No mass or lymphadenopathy identified.  IMPRESSION: Stable cardiomegaly.  No active lung disease.   Original Report Authenticated By: Earle Gell, M.D.     Microbiology: Recent Results (from the past 240 hour(s))  MRSA PCR SCREENING     Status: None   Collection Time    07/13/12  7:35 PM  Result Value Range Status   MRSA by PCR NEGATIVE  NEGATIVE Final   Comment:            The GeneXpert MRSA Assay (FDA     approved for NASAL specimens     only), is one component of a     comprehensive MRSA colonization     surveillance program. It is not     intended to diagnose MRSA     infection nor to guide or     monitor treatment for     MRSA infections.     Labs: Basic Metabolic Panel:  Recent Labs Lab 07/13/12 1141 07/13/12 1640 07/13/12 2144 07/14/12 0121 07/14/12 0453 07/15/12 0749  NA 127*  --  136 135 134* 133*  K 4.1  --  3.7 3.9 3.7 4.1  CL 83*  --  98 97 97 98  CO2 29  --  29 25 23 26   GLUCOSE 790*  --  104* 217* 248* 199*  BUN 35*  --  33* 33* 30* 22  CREATININE 1.93*  --  1.83* 1.85* 1.66* 1.22*  CALCIUM 9.7  --  8.7 8.6 8.0* 8.8  MG  --  2.0  --  1.8  --   --   PHOS  --  2.2*  --   --   --   --    Liver Function Tests:  Recent Labs Lab 07/13/12 1141 07/15/12 0749  AST 16 16  ALT 17 14  ALKPHOS 99 74  BILITOT 0.6 0.4  PROT 7.5 6.7  ALBUMIN 3.7 3.2*     CBC:  Recent Labs Lab 07/13/12 1141  WBC 7.6  NEUTROABS 4.7  HGB 13.4  HCT 40.6  MCV 85.1  PLT 158   Cardiac Enzymes:  Recent Labs Lab 07/13/12 1141 07/14/12 0121  TROPONINI <0.30  <0.30     CBG:  Recent Labs Lab 07/14/12 0748 07/14/12 1118 07/14/12 1654 07/14/12 2200 07/15/12 0754  GLUCAP 251* 267* 186* 218* 192*       Signed:  Nesbitt C  [facility] 07/15/2012, 8:40 AM

## 2012-07-15 NOTE — Progress Notes (Signed)
Patient discharged to home, taken down in wheelchair.  Patient stated her friend was taking her home.

## 2012-07-15 NOTE — Plan of Care (Signed)
Problem: Food- and Nutrition-Related Knowledge Deficit (NB-1.1) Goal: Nutrition education Formal process to instruct or train a patient/client in a skill or to impart knowledge to help patients/clients voluntarily manage or modify food choices and eating behavior to maintain or improve health. Outcome: Adequate for Discharge RD consulted for nutrition education regarding diabetes.     Lab Results     Component  Value  Date      HGBA1C  16.0*  07/13/2012    Pt reports hx of poor compliance with diabetic self management. She stopped checking her blood sugars and has been drinking a lot of orange juice because she has had a cold. She does not remember her previous Hgb A1c. She reports a hx of diabetes for 10 years and reports she attended a diabetes education class "a long time ago". BS was 800 upon admission. 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 She does consume 3 meals per day. Portion control is a large barrier for patient.  Discussed importance of self-management behaviors in order to prevent further complications. Stressed importance of taking medications, checking blood sugars, and attending regular PCP follow-ups.  RD provided "Carbohydrate Counting for People with Diabetes" handout from the Academy of Nutrition and Dietetics. Discussed different food groups and their effects on blood sugar, emphasizing carbohydrate-containing foods. Provided list of carbohydrates and recommended serving sizes of common foods.  Discussed importance of controlled and consistent carbohydrate intake throughout the day. Provided examples of ways to balance meals/snacks and encouraged intake of high-fiber, whole grain complex carbohydrates. Teach back method used.  Expect fair compliance.  Body mass index is 36.04 kg/(m^2). Pt meets criteria for obesity, class II based on current BMI.   Current diet order is carb modified, patient is consuming approximately n/a% of meals at this time. Labs and medications reviewed. No further nutrition interventions warranted at this time. RD contact information provided. If additional nutrition issues arise, please re-consult RD.  Information provided to pt regarding free group diabetes education classes at [facility]. Encouraged attendance.  Joaquim Lai, RD, LDN  Pager: [phone]

## 2012-07-15 NOTE — Progress Notes (Deleted)
  RD consulted for nutrition education regarding diabetes.   Lab Results  Component Value Date   HGBA1C 16.0* 07/13/2012   Pt reports hx of poor compliance with diabetic self management. She stopped checking her blood sugars and has been drinking a lot of orange juice because she has had a cold. She does not remember her previous Hgb A1c. She reports a hx of diabetes for 10 years and reports she attended a diabetes education class "a long time ago". BS was 800 upon admission. 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 She does consume 3 meals per day. Portion control is a large barrier for patient.   Discussed importance of self-management behaviors in order to prevent further complications. Stressed importance of taking medications, checking blood sugars, and attending regular PCP follow-ups.   RD provided "Carbohydrate Counting for People with Diabetes" handout from the Academy of Nutrition and Dietetics. Discussed different food groups and their effects on blood sugar, emphasizing carbohydrate-containing foods. Provided list of carbohydrates and recommended serving sizes of common foods.  Discussed importance of controlled and consistent carbohydrate intake throughout the day. Provided examples of ways to balance meals/snacks and encouraged intake of high-fiber, whole grain complex carbohydrates. Teach back method used.  Expect fair compliance.  Body mass index is 36.04 kg/(m^2). Pt meets criteria for obesity, class II based on current BMI.  Current diet order is carb modified, patient is consuming approximately n/a% of meals at this time. Labs and medications reviewed. No further nutrition interventions warranted at this time. RD contact information provided. If additional nutrition issues arise, please re-consult RD.  Information provided to pt regarding free group  diabetes education classes at [facility]. Encouraged attendance.   Joaquim Lai, RD, LDN Pager: [phone]

## 2012-07-15 NOTE — Progress Notes (Signed)
Pt HR dipped to 49, went to check on pt and she was half asleep, woke her up and got her moving in bed and talking and HR came up to 62 but then fell right back to low 50's and then sustained there.  Paged MD to make aware, will cont to monitor and await orders if any

## 2014-08-18 DIAGNOSIS — I10 Essential (primary) hypertension: Secondary | ICD-10-CM | POA: Diagnosis not present

## 2014-08-18 DIAGNOSIS — N183 Chronic kidney disease, stage 3 (moderate): Secondary | ICD-10-CM | POA: Diagnosis not present

## 2014-08-18 DIAGNOSIS — E1122 Type 2 diabetes mellitus with diabetic chronic kidney disease: Secondary | ICD-10-CM | POA: Diagnosis not present

## 2014-08-18 DIAGNOSIS — E119 Type 2 diabetes mellitus without complications: Secondary | ICD-10-CM | POA: Diagnosis not present

## 2015-03-20 ENCOUNTER — Inpatient Hospital Stay (HOSPITAL_COMMUNITY): Payer: Medicare Other

## 2015-03-20 ENCOUNTER — Encounter (HOSPITAL_COMMUNITY): Payer: Self-pay | Admitting: Emergency Medicine

## 2015-03-20 ENCOUNTER — Inpatient Hospital Stay (HOSPITAL_COMMUNITY)
Admission: EM | Admit: 2015-03-20 | Discharge: 2015-03-23 | DRG: 683 | Disposition: A | Discharge status: Home / Self Care | Payer: Medicare Other | Attending: Internal Medicine | Admitting: Internal Medicine

## 2015-03-20 DIAGNOSIS — I129 Hypertensive chronic kidney disease with stage 1 through stage 4 chronic kidney disease, or unspecified chronic kidney disease: Secondary | ICD-10-CM | POA: Diagnosis present

## 2015-03-20 DIAGNOSIS — E1122 Type 2 diabetes mellitus with diabetic chronic kidney disease: Secondary | ICD-10-CM | POA: Diagnosis present

## 2015-03-20 DIAGNOSIS — E876 Hypokalemia: Secondary | ICD-10-CM | POA: Diagnosis present

## 2015-03-20 DIAGNOSIS — E860 Dehydration: Secondary | ICD-10-CM | POA: Diagnosis present

## 2015-03-20 DIAGNOSIS — N183 Chronic kidney disease, stage 3 unspecified: Secondary | ICD-10-CM | POA: Diagnosis present

## 2015-03-20 DIAGNOSIS — N179 Acute kidney failure, unspecified: Secondary | ICD-10-CM | POA: Diagnosis not present

## 2015-03-20 DIAGNOSIS — E785 Hyperlipidemia, unspecified: Secondary | ICD-10-CM | POA: Diagnosis not present

## 2015-03-20 DIAGNOSIS — Z9114 Patient's other noncompliance with medication regimen: Secondary | ICD-10-CM | POA: Diagnosis not present

## 2015-03-20 DIAGNOSIS — E1165 Type 2 diabetes mellitus with hyperglycemia: Secondary | ICD-10-CM | POA: Diagnosis present

## 2015-03-20 DIAGNOSIS — E871 Hypo-osmolality and hyponatremia: Secondary | ICD-10-CM | POA: Diagnosis not present

## 2015-03-20 DIAGNOSIS — E1121 Type 2 diabetes mellitus with diabetic nephropathy: Secondary | ICD-10-CM | POA: Diagnosis not present

## 2015-03-20 DIAGNOSIS — I10 Essential (primary) hypertension: Secondary | ICD-10-CM | POA: Diagnosis present

## 2015-03-20 DIAGNOSIS — Z23 Encounter for immunization: Secondary | ICD-10-CM

## 2015-03-20 DIAGNOSIS — R739 Hyperglycemia, unspecified: Secondary | ICD-10-CM | POA: Diagnosis present

## 2015-03-20 DIAGNOSIS — Z8249 Family history of ischemic heart disease and other diseases of the circulatory system: Secondary | ICD-10-CM

## 2015-03-20 DIAGNOSIS — J029 Acute pharyngitis, unspecified: Secondary | ICD-10-CM | POA: Diagnosis not present

## 2015-03-20 DIAGNOSIS — R05 Cough: Secondary | ICD-10-CM | POA: Diagnosis not present

## 2015-03-20 DIAGNOSIS — Z833 Family history of diabetes mellitus: Secondary | ICD-10-CM | POA: Diagnosis not present

## 2015-03-20 DIAGNOSIS — E1110 Type 2 diabetes mellitus with ketoacidosis without coma: Secondary | ICD-10-CM | POA: Diagnosis present

## 2015-03-20 DIAGNOSIS — E0821 Diabetes mellitus due to underlying condition with diabetic nephropathy: Secondary | ICD-10-CM | POA: Diagnosis not present

## 2015-03-20 DIAGNOSIS — R059 Cough, unspecified: Secondary | ICD-10-CM

## 2015-03-20 LAB — URINE MICROSCOPIC-ADD ON

## 2015-03-20 LAB — GLUCOSE, CAPILLARY
Glucose-Capillary: 398 mg/dL — ABNORMAL HIGH (ref 65–99)
Glucose-Capillary: 239 mg/dL — ABNORMAL HIGH (ref 65–99)
Glucose-Capillary: 204 mg/dL — ABNORMAL HIGH (ref 65–99)
Glucose-Capillary: 137 mg/dL — ABNORMAL HIGH (ref 65–99)
Glucose-Capillary: 145 mg/dL — ABNORMAL HIGH (ref 65–99)
Glucose-Capillary: 163 mg/dL — ABNORMAL HIGH (ref 65–99)

## 2015-03-20 LAB — CBG MONITORING, ED
Glucose-Capillary: >600 mg/dL (ref 65–99)
Glucose-Capillary: 529 mg/dL — ABNORMAL HIGH (ref 65–99)
Glucose-Capillary: 471 mg/dL — ABNORMAL HIGH (ref 65–99)

## 2015-03-20 LAB — BASIC METABOLIC PANEL
Anion gap: 14 (ref 5–15)
BUN: 51 mg/dL — ABNORMAL HIGH (ref 6–20)
CO2: 27 mmol/L (ref 22–32)
Calcium: 9.6 mg/dL (ref 8.9–10.3)
Chloride: 89 mmol/L — ABNORMAL LOW (ref 101–111)
Creatinine, Ser: 2.77 mg/dL — ABNORMAL HIGH (ref 0.44–1.00)
GFR calc Af Amer: 18 mL/min — ABNORMAL LOW (ref >60)
GFR calc non Af Amer: 15 mL/min — ABNORMAL LOW (ref >60)
Glucose, Bld: 701 mg/dL (ref 65–99)
Potassium: 4.5 mmol/L (ref 3.5–5.1)
Sodium: 130 mmol/L — ABNORMAL LOW (ref 135–145)

## 2015-03-20 LAB — CBC WITH DIFFERENTIAL/PLATELET
Basophils Absolute: 0 10*3/uL (ref 0.0–0.1)
Basophils Relative: 0 %
Eosinophils Absolute: 0.2 10*3/uL (ref 0.0–0.7)
Eosinophils Relative: 2 %
HCT: 40.4 % (ref 36.0–46.0)
Hemoglobin: 13.4 g/dL (ref 12.0–15.0)
Lymphocytes Relative: 24 %
Lymphs Abs: 2.4 10*3/uL (ref 0.7–4.0)
MCH: 28.5 pg (ref 26.0–34.0)
MCHC: 33.2 g/dL (ref 30.0–36.0)
MCV: 85.8 fL (ref 78.0–100.0)
Monocytes Absolute: 0.5 10*3/uL (ref 0.1–1.0)
Monocytes Relative: 5 %
Neutro Abs: 6.7 10*3/uL (ref 1.7–7.7)
Neutrophils Relative %: 69 %
Platelets: 189 10*3/uL (ref 150–400)
RBC: 4.71 MIL/uL (ref 3.87–5.11)
RDW: 13.2 % (ref 11.5–15.5)
WBC: 9.8 10*3/uL (ref 4.0–10.5)

## 2015-03-20 LAB — URINALYSIS, ROUTINE W REFLEX MICROSCOPIC
Bilirubin Urine: NEGATIVE
Glucose, UA: >1000 mg/dL — AB
Ketones, ur: NEGATIVE mg/dL
Leukocytes, UA: NEGATIVE
Nitrite: NEGATIVE
Protein, ur: NEGATIVE mg/dL
Specific Gravity, Urine: 1.01 (ref 1.005–1.030)
pH: 5.5 (ref 5.0–8.0)

## 2015-03-20 MED ORDER — DEXTROSE 50 % IV SOLN: 25.0000 mL | INTRAVENOUS | Status: DC | PRN

## 2015-03-20 MED ORDER — INFLUENZA VAC SPLIT QUAD 0.5 ML IM SUSY
0.5000 mL | PREFILLED_SYRINGE | INTRAMUSCULAR | Status: AC
  Administered 2015-03-21: 0.5 mL via INTRAMUSCULAR
  Filled 2015-03-20: qty 0.5

## 2015-03-20 MED ORDER — INSULIN ASPART 100 UNIT/ML ~~LOC~~ SOLN
0.0000 [IU] | Freq: Every day | SUBCUTANEOUS | Status: DC
  Administered 2015-03-22: 2 [IU] via SUBCUTANEOUS

## 2015-03-20 MED ORDER — PNEUMOCOCCAL VAC POLYVALENT 25 MCG/0.5ML IJ INJ
0.5000 mL | INJECTION | INTRAMUSCULAR | Status: AC
  Administered 2015-03-21: 0.5 mL via INTRAMUSCULAR
  Filled 2015-03-20: qty 0.5

## 2015-03-20 MED ORDER — SODIUM CHLORIDE 0.9 % IV SOLN: Freq: Once | INTRAVENOUS | Status: DC

## 2015-03-20 MED ORDER — ONDANSETRON HCL 4 MG PO TABS
4.0000 mg | ORAL_TABLET | Freq: Four times a day (QID) | ORAL | Status: DC | PRN
Start: 2015-03-20 — End: 2015-03-23

## 2015-03-20 MED ORDER — ASPIRIN EC 81 MG PO TBEC
81.0000 mg | DELAYED_RELEASE_TABLET | Freq: Every day | ORAL | Status: DC
  Administered 2015-03-20 – 2015-03-23 (×4): 81 mg via ORAL
  Filled 2015-03-20 (×4): qty 1

## 2015-03-20 MED ORDER — ACETAMINOPHEN 650 MG RE SUPP
650.0000 mg | Freq: Four times a day (QID) | RECTAL | Status: DC | PRN
Start: 2015-03-20 — End: 2015-03-23

## 2015-03-20 MED ORDER — ACETAMINOPHEN 325 MG PO TABS: 650.0000 mg | ORAL_TABLET | Freq: Four times a day (QID) | ORAL | Status: DC | PRN

## 2015-03-20 MED ORDER — SODIUM CHLORIDE 0.9 % IV SOLN
INTRAVENOUS | Status: DC
Start: 2015-03-20 — End: 2015-03-22
  Administered 2015-03-20: 500 mL via INTRAVENOUS
  Administered 2015-03-20: 1000 mL via INTRAVENOUS
  Administered 2015-03-21: 23:00:00 via INTRAVENOUS
  Administered 2015-03-21: 1000 mL via INTRAVENOUS
  Administered 2015-03-21: 15:00:00 via INTRAVENOUS
  Administered 2015-03-21: 500 mL via INTRAVENOUS
  Administered 2015-03-22: 06:00:00 via INTRAVENOUS

## 2015-03-20 MED ORDER — INSULIN ASPART 100 UNIT/ML ~~LOC~~ SOLN
0.0000 [IU] | Freq: Three times a day (TID) | SUBCUTANEOUS | Status: DC
  Administered 2015-03-21: 2 [IU] via SUBCUTANEOUS
  Administered 2015-03-21: 3 [IU] via SUBCUTANEOUS
  Administered 2015-03-21: 8 [IU] via SUBCUTANEOUS
  Administered 2015-03-22: 3 [IU] via SUBCUTANEOUS
  Administered 2015-03-22: 5 [IU] via SUBCUTANEOUS
  Administered 2015-03-22: 8 [IU] via SUBCUTANEOUS
  Administered 2015-03-23: 3 [IU] via SUBCUTANEOUS

## 2015-03-20 MED ORDER — SODIUM CHLORIDE 0.9 % IV BOLUS (SEPSIS)
1000.0000 mL | Freq: Once | INTRAVENOUS | Status: AC
  Administered 2015-03-20: 1000 mL via INTRAVENOUS

## 2015-03-20 MED ORDER — ENOXAPARIN SODIUM 40 MG/0.4ML ~~LOC~~ SOLN: 40.0000 mg | SUBCUTANEOUS | Status: DC

## 2015-03-20 MED ORDER — INSULIN DETEMIR 100 UNIT/ML ~~LOC~~ SOLN
20.0000 [IU] | Freq: Every day | SUBCUTANEOUS | Status: DC
  Administered 2015-03-20: 20 [IU] via SUBCUTANEOUS
  Filled 2015-03-20 (×2): qty 0.2

## 2015-03-20 MED ORDER — ENOXAPARIN SODIUM 30 MG/0.3ML ~~LOC~~ SOLN
30.0000 mg | SUBCUTANEOUS | Status: DC
  Administered 2015-03-20 – 2015-03-21 (×2): 30 mg via SUBCUTANEOUS
  Filled 2015-03-20 (×2): qty 0.3

## 2015-03-20 MED ORDER — ATORVASTATIN CALCIUM 40 MG PO TABS
40.0000 mg | ORAL_TABLET | Freq: Every day | ORAL | Status: DC
  Administered 2015-03-20 – 2015-03-23 (×4): 40 mg via ORAL
  Filled 2015-03-20 (×4): qty 1

## 2015-03-20 MED ORDER — HYDRALAZINE HCL 20 MG/ML IJ SOLN
10.0000 mg | Freq: Four times a day (QID) | INTRAMUSCULAR | Status: DC | PRN
  Administered 2015-03-22: 10 mg via INTRAVENOUS
  Filled 2015-03-20 (×2): qty 1

## 2015-03-20 MED ORDER — SODIUM CHLORIDE 0.9 % IV SOLN
INTRAVENOUS | Status: DC
  Administered 2015-03-20: 4.7 [IU]/h via INTRAVENOUS
  Filled 2015-03-20: qty 2.5

## 2015-03-20 MED ORDER — INSULIN REGULAR BOLUS VIA INFUSION
0.0000 [IU] | Freq: Three times a day (TID) | INTRAVENOUS | Status: DC
  Administered 2015-03-20: 7.4 [IU] via INTRAVENOUS
  Filled 2015-03-20: qty 10

## 2015-03-20 MED ORDER — VERAPAMIL HCL 80 MG PO TABS
80.0000 mg | ORAL_TABLET | Freq: Three times a day (TID) | ORAL | Status: DC
  Administered 2015-03-20 – 2015-03-23 (×9): 80 mg via ORAL
  Filled 2015-03-20 (×9): qty 1

## 2015-03-20 MED ORDER — ONDANSETRON HCL 4 MG/2ML IJ SOLN: 4.0000 mg | Freq: Four times a day (QID) | INTRAMUSCULAR | Status: DC | PRN

## 2015-03-20 MED ORDER — SODIUM CHLORIDE 0.9 % IV SOLN
INTRAVENOUS | Status: DC
  Administered 2015-03-20: 10.1 [IU]/h via INTRAVENOUS
  Administered 2015-03-20: 5.4 [IU]/h via INTRAVENOUS
  Filled 2015-03-20: qty 2.5

## 2015-03-20 NOTE — H&P (Signed)
[facility] History and Physical  Chelsea Meza [id number] DOB: April 04, 1936 DOA: 03/20/2015  Referring physician: Virgel Manifold, MD PCP: Maggie Font, MD   Chief Complaint: Hyperglycemia  HPI: Chelsea Meza is a 79 y.o. female with a hx of PSVT, NIDDM, and HLD presents with complaints of a dry throat. Patient states she is typically on Amaryl and Janumet for blood sugar control however she ran out of Amaryl approximately 3 days ago. She has continued to take her Janumet as prescribed. Since she ran out of her medication, she has had  polydipsia, polyuria, and a dry/sratchy throat. She denies any dysuria, fever, dizziness, weakness, nausea, vomiting, abdominal pain, CP, or SOB.   While in the ED, labs revealed a sodium of 130, creatinine 2.77, a glucose of 700, and an anion gap of 14. She will be admitted for further management of hyperglycemia and AKI.   Review of Systems:  Constitutional:  Polydipsia No weight loss, night sweats, Fevers, chills, fatigue.  HEENT:  Dry throat No headaches, Difficulty swallowing,Tooth/dental problems No sneezing, itching, ear ache, nasal congestion, post nasal drip,  Cardio-vascular:  No chest pain, Orthopnea, PND, swelling in lower extremities, anasarca, dizziness, palpitations  GI:  No heartburn, indigestion, abdominal pain, nausea, vomiting, diarrhea, change in bowel habits, loss of appetite  Resp:  No shortness of breath with exertion or at rest. No excess mucus, no productive cough, No non-productive cough, No coughing up of blood.No change in color of mucus.No wheezing.No chest wall deformity  Skin:  no rash or lesions.  GU:  Polyuria no dysuria, change in color of urine. No flank pain.  Musculoskeletal:  No joint pain or swelling. No decreased range of motion. No back pain.  Psych:  No change in mood or affect. No depression or anxiety. No memory loss.   Past Medical History  Diagnosis Date  . Diabetes mellitus without  complication ([place])   . Hypertension    Past Surgical History  Procedure Laterality Date  . Cholecystectomy     Social History:  reports that she has never smoked. She has never used smokeless tobacco. She reports that she does not drink alcohol or use illicit drugs.  No Known Allergies  Family history: Positive for DM and HTN in both mother and father.   Prior to Admission medications   Medication Sig Start Date End Date Taking? Authorizing Provider  aspirin EC 81 MG tablet Take 81 mg by mouth daily.   Yes Historical Provider, MD  atorvastatin (LIPITOR) 40 MG tablet Take 40 mg by mouth daily.   Yes Historical Provider, MD  glimepiride (AMARYL) 4 MG tablet Take 1 tablet (4 mg total) by mouth daily with breakfast. 07/15/12  Yes Nimish C Gosrani, MD  Olmesartan-Amlodipine-HCTZ (TRIBENZOR) 40-5-25 MG TABS Take 1 tablet by mouth daily.    Yes Historical Provider, MD  sitaGLIPtin-metformin (JANUMET) 50-1000 MG tablet Take 1 tablet by mouth 2 (two) times daily with a meal.   Yes Historical Provider, MD  verapamil (CALAN) 80 MG tablet Take 1 tablet (80 mg total) by mouth 3 (three) times daily. 07/15/12  Yes Doree Albee, MD   Physical Exam: Filed Vitals:   03/20/15 1143 03/20/15 1230 03/20/15 1400  BP: 184/95 152/88 146/94  Pulse: 101 95 92  Temp: 98.1 F (36.7 C)    TempSrc: Oral    Resp: 16 16 16   Height: 5\' 7"  (1.702 m)    Weight: 104.327 kg (230 lb)    SpO2: 98% 96% 94%  Wt Readings from Last 3 Encounters:  03/20/15 104.327 kg (230 lb)  07/14/12 107.5 kg (236 lb 15.9 oz)    General:  Appears calm and comfortable Eyes: PERRL, normal lids, irises & conjunctiva ENT: grossly normal hearing. Dry mucous membranes, some pharyngeal erythema Neck: no LAD, masses or thyromegaly Cardiovascular: RRR, no m/r/g. No LE edema. Telemetry: SR, no arrhythmias  Respiratory: CTA bilaterally, no w/r/r. Normal respiratory effort. Abdomen: soft, ntnd Skin: no rash or induration seen on  limited exam Musculoskeletal: grossly normal tone BUE/BLE Psychiatric: grossly normal mood and affect, speech fluent and appropriate Neurologic: grossly non-focal.          Labs on Admission:  Basic Metabolic Panel:  Recent Labs Lab 03/20/15 1251  NA 130*  K 4.5  CL 89*  CO2 27  GLUCOSE 701*  BUN 51*  CREATININE 2.77*  CALCIUM 9.6   CBC:  Recent Labs Lab 03/20/15 1251  WBC 9.8  NEUTROABS 6.7  HGB 13.4  HCT 40.4  MCV 85.8  PLT 189    CBG:  Recent Labs Lab 03/20/15 1148  GLUCAP >600*     Assessment/Plan Active Problems:   Essential hypertension, benign   Hyponatremia   AKI (acute kidney injury) ([name])   CKD (chronic kidney disease) stage 3, GFR 30-59 ml/min   Hyperglycemia   HLD (hyperlipidemia)   Diabetes mellitus due to underlying condition with diabetic nephropathy ([place])   1. Hyperglycemia , with a hx of uncontrolled NIDDM. Likely related to recent medication noncompliance. CBG 701. Will continue Insulin infusion and IV hydration and monitor closely. No indication of DKA, anion gap of 14. Her daughter reports that her blood sugars are normally well controlled on her oral antihyperglycemic regimen. Will check HgbA1C. 2. AKI superimposed on CKD stage III. Creatinine 2.77, prior labs from 06/2012 reveal a creatinine of 1.22. Will continue IVF and monitor closely. Will hold HCTZ and ACE-I 3. Dehydration in the setting of polyuria and decreased PO intake, will continue IVF. 4. Hyponatremia, likely pseudohyponatremia in the setting of hyperglycemia. Will continue IVF and follow.  5. HLD, continue statin.  6. Essential HTN, BP elevated. Holding Tribenzor. Will continue Amlodipine and use PRN Hydralazine.    Code Status: Partial, DNI DVT Prophylaxis: Lovenox Family Communication: Daughter at bedside. Discussed with patient who understands and has no concerns at this time. Disposition Plan: Anticipate discharge in 1-2 days.   Time spent: 50 minutes    [employer]. MD [facility] Pager [phone]   By signing my name below, I, Rosalie Doctor, attest that this documentation has been prepared under the direction and in the presence of [facility]. MD Electronically Signed: Rosalie Doctor, Scribe. 03/20/2015 1:54pm  I, Dr. Kathie Dike, personally performed the services described in this documentaiton. All medical record entries made by the scribe were at my direction and in my presence. I have reviewed the chart and agree that the record reflects my personal performance and is accurate and complete  Kathie Dike, MD, 03/20/2015 3:17 PM

## 2015-03-20 NOTE — ED Notes (Addendum)
Pt c/o of sore throat and cough x 3 days. Denies CP/SOB. Denies n/v/d. Pt reports coughing up thin, white sputum. CBG >600 in triage.

## 2015-03-20 NOTE — ED Provider Notes (Signed)
CSN: [id number]     Arrival date & time 03/20/15  1138 History   First MD Initiated Contact with Patient 03/20/15 1158     Chief Complaint  Patient presents with  . Hyperglycemia  . Sore Throat     (Consider location/radiation/quality/duration/timing/severity/associated sxs/prior Treatment) HPI   79yF with hyperglycemia. Ran out of amaryl 3 days ago. Has continued to take janumet. Not on insulin. Previously not receptive to taking it at home. Increasing thirst. Triage mentions sore throat but she tells me it feels more dry/scratchy and "full of thick stuff" as opposed to actually painful. Polyuria. No dysuria. No fever or chills. No n/v. No CP or respiratory complaints.   Past Medical History  Diagnosis Date  . Diabetes mellitus without complication ([place])   . Hypertension    Past Surgical History  Procedure Laterality Date  . Cholecystectomy     No family history on file. Social History  Substance Use Topics  . Smoking status: Never Smoker   . Smokeless tobacco: Never Used  . Alcohol Use: No   OB History    No data available     Review of Systems  All systems reviewed and negative, other than as noted in HPI.   Allergies  Review of patient's allergies indicates no known allergies.  Home Medications   Prior to Admission medications   Medication Sig Start Date End Date Taking? Authorizing Provider  aspirin EC 81 MG tablet Take 81 mg by mouth daily.    Historical Provider, MD  atorvastatin (LIPITOR) 40 MG tablet Take 40 mg by mouth daily.    Historical Provider, MD  benzonatate (TESSALON) 100 MG capsule Take 100 mg by mouth 3 (three) times daily as needed for cough.    Historical Provider, MD  glimepiride (AMARYL) 4 MG tablet Take 1 tablet (4 mg total) by mouth daily with breakfast. 07/15/12   Doree Albee, MD  HYDROcodone-acetaminophen (NORCO/VICODIN) 5-325 MG per tablet Take 1-2 tablets by mouth every 4 (four) hours as needed. 07/15/12   Nimish Luther Parody, MD   metoprolol tartrate (LOPRESSOR) 25 MG tablet Take 25 mg by mouth 2 (two) times daily.    Historical Provider, MD  Olmesartan-Amlodipine-HCTZ (TRIBENZOR) 40-5-25 MG TABS Take by mouth.    Historical Provider, MD  pioglitazone (ACTOS) 30 MG tablet Take 1 tablet (30 mg total) by mouth daily. 07/15/12   Nimish Luther Parody, MD  verapamil (CALAN) 80 MG tablet Take 1 tablet (80 mg total) by mouth 3 (three) times daily. 07/15/12   Nimish C Anastasio Champion, MD   BP 152/88 mmHg  Pulse 95  Temp(Src) 98.1 F (36.7 C) (Oral)  Resp 16  Ht 5\' 7"  (1.702 m)  Wt 230 lb (104.327 kg)  BMI 36.01 kg/m2  SpO2 96% Physical Exam  Constitutional: She appears well-developed and well-nourished. No distress.  HENT:  Head: Normocephalic and atraumatic.  Eyes: Conjunctivae are normal. Right eye exhibits no discharge. Left eye exhibits no discharge.  Neck: Neck supple.  Cardiovascular: Regular rhythm and normal heart sounds.  Exam reveals no gallop and no friction rub.   No murmur heard. Mild tachycardia  Pulmonary/Chest: Effort normal and breath sounds normal. No respiratory distress.  Abdominal: Soft. She exhibits no distension. There is no tenderness.  Musculoskeletal: She exhibits no edema or tenderness.  Neurological: She is alert.  Skin: Skin is warm and dry.  Psychiatric: She has a normal mood and affect. Her behavior is normal. Thought content normal.  Nursing note and vitals reviewed.  ED Course  Procedures (including critical care time) Labs Review Labs Reviewed  BASIC METABOLIC PANEL - Abnormal; Notable for the following:    Sodium 130 (*)    Chloride 89 (*)    Glucose, Bld 701 (*)    BUN 51 (*)    Creatinine, Ser 2.77 (*)    GFR calc non Af Amer 15 (*)    GFR calc Af Amer 18 (*)    All other components within normal limits  URINALYSIS, ROUTINE W REFLEX MICROSCOPIC (NOT AT [facility]) - Abnormal; Notable for the following:    APPearance HAZY (*)    Glucose, UA >1000 (*)    Hgb urine dipstick TRACE (*)     All other components within normal limits  URINE MICROSCOPIC-ADD ON - Abnormal; Notable for the following:    Squamous Epithelial / LPF 6-30 (*)    Bacteria, UA MANY (*)    All other components within normal limits  CBG MONITORING, ED - Abnormal; Notable for the following:    Glucose-Capillary >600 (*)    All other components within normal limits  CBC WITH DIFFERENTIAL/PLATELET    Imaging Review No results found. I have personally reviewed and evaluated these images and lab results as part of my medical decision-making.   EKG Interpretation None      MDM   Final diagnoses:  Cough  Hyperglycemia Acute Kidney Injury  79yF with hyperglycemia/dehydration/AKI. Says ran out of amaryl 3 days ago. Prescription bottle  Still has two janumet pills in bottle and amaryl empty.  . Most likely cause. No other obvious precipitant/stressor. Afebrile. Nontoxic. No significant complaints aside from feeling very thirsty and maybe a little fatigued. Has continued to take janumet.  No metabolic acidosis or anion gap. No ketones on UA.   AKI. Last labs 2 years ago but Cr 1.22 then upon discharge after admission then also for dehydration/hyperglycemia. Currently functioning but positional 20g in L forearm. Needs additional IV access. NS bolus. Continued fluids. Insulin. Admission.     Virgel Manifold, MD 04/03/15 2116

## 2015-03-21 DIAGNOSIS — Z23 Encounter for immunization: Secondary | ICD-10-CM | POA: Diagnosis not present

## 2015-03-21 DIAGNOSIS — E871 Hypo-osmolality and hyponatremia: Secondary | ICD-10-CM | POA: Diagnosis not present

## 2015-03-21 DIAGNOSIS — N183 Chronic kidney disease, stage 3 (moderate): Secondary | ICD-10-CM | POA: Diagnosis not present

## 2015-03-21 DIAGNOSIS — E876 Hypokalemia: Secondary | ICD-10-CM | POA: Diagnosis not present

## 2015-03-21 DIAGNOSIS — E785 Hyperlipidemia, unspecified: Secondary | ICD-10-CM | POA: Diagnosis not present

## 2015-03-21 DIAGNOSIS — Z833 Family history of diabetes mellitus: Secondary | ICD-10-CM | POA: Diagnosis not present

## 2015-03-21 DIAGNOSIS — N179 Acute kidney failure, unspecified: Secondary | ICD-10-CM | POA: Diagnosis not present

## 2015-03-21 DIAGNOSIS — Z9114 Patient's other noncompliance with medication regimen: Secondary | ICD-10-CM | POA: Diagnosis not present

## 2015-03-21 DIAGNOSIS — Z8249 Family history of ischemic heart disease and other diseases of the circulatory system: Secondary | ICD-10-CM | POA: Diagnosis not present

## 2015-03-21 DIAGNOSIS — I129 Hypertensive chronic kidney disease with stage 1 through stage 4 chronic kidney disease, or unspecified chronic kidney disease: Secondary | ICD-10-CM | POA: Diagnosis not present

## 2015-03-21 DIAGNOSIS — E1121 Type 2 diabetes mellitus with diabetic nephropathy: Secondary | ICD-10-CM | POA: Diagnosis not present

## 2015-03-21 DIAGNOSIS — E1122 Type 2 diabetes mellitus with diabetic chronic kidney disease: Secondary | ICD-10-CM | POA: Diagnosis not present

## 2015-03-21 DIAGNOSIS — E1165 Type 2 diabetes mellitus with hyperglycemia: Secondary | ICD-10-CM | POA: Diagnosis not present

## 2015-03-21 DIAGNOSIS — E860 Dehydration: Secondary | ICD-10-CM | POA: Diagnosis not present

## 2015-03-21 LAB — CBC
HCT: 24.3 % — ABNORMAL LOW (ref 36.0–46.0)
HCT: 32.7 % — ABNORMAL LOW (ref 36.0–46.0)
Hemoglobin: 8 g/dL — ABNORMAL LOW (ref 12.0–15.0)
Hemoglobin: 10.7 g/dL — ABNORMAL LOW (ref 12.0–15.0)
MCH: 28.3 pg (ref 26.0–34.0)
MCH: 28 pg (ref 26.0–34.0)
MCHC: 32.9 g/dL (ref 30.0–36.0)
MCHC: 32.7 g/dL (ref 30.0–36.0)
MCV: 85.9 fL (ref 78.0–100.0)
MCV: 85.6 fL (ref 78.0–100.0)
Platelets: 107 10*3/uL — ABNORMAL LOW (ref 150–400)
Platelets: 140 10*3/uL — ABNORMAL LOW (ref 150–400)
RBC: 2.83 MIL/uL — ABNORMAL LOW (ref 3.87–5.11)
RBC: 3.82 MIL/uL — ABNORMAL LOW (ref 3.87–5.11)
RDW: 13.2 % (ref 11.5–15.5)
RDW: 13.2 % (ref 11.5–15.5)
WBC: 6.4 10*3/uL (ref 4.0–10.5)
WBC: 7.2 10*3/uL (ref 4.0–10.5)

## 2015-03-21 LAB — COMPREHENSIVE METABOLIC PANEL
ALT: 11 U/L — ABNORMAL LOW (ref 14–54)
ALT: 13 U/L — ABNORMAL LOW (ref 14–54)
AST: 13 U/L — ABNORMAL LOW (ref 15–41)
AST: 17 U/L (ref 15–41)
Albumin: 2.2 g/dL — ABNORMAL LOW (ref 3.5–5.0)
Albumin: 3 g/dL — ABNORMAL LOW (ref 3.5–5.0)
Alkaline Phosphatase: 46 U/L (ref 38–126)
Alkaline Phosphatase: 65 U/L (ref 38–126)
Anion gap: 3 — ABNORMAL LOW (ref 5–15)
Anion gap: 8 (ref 5–15)
BUN: 31 mg/dL — ABNORMAL HIGH (ref 6–20)
BUN: 37 mg/dL — ABNORMAL HIGH (ref 6–20)
CO2: 22 mmol/L (ref 22–32)
CO2: 27 mmol/L (ref 22–32)
Calcium: 6.4 mg/dL — CL (ref 8.9–10.3)
Calcium: 8.5 mg/dL — ABNORMAL LOW (ref 8.9–10.3)
Chloride: 116 mmol/L — ABNORMAL HIGH (ref 101–111)
Chloride: 104 mmol/L (ref 101–111)
Creatinine, Ser: 1.2 mg/dL — ABNORMAL HIGH (ref 0.44–1.00)
Creatinine, Ser: 1.52 mg/dL — ABNORMAL HIGH (ref 0.44–1.00)
GFR calc Af Amer: 49 mL/min — ABNORMAL LOW (ref >60)
GFR calc Af Amer: 37 mL/min — ABNORMAL LOW (ref >60)
GFR calc non Af Amer: 42 mL/min — ABNORMAL LOW (ref >60)
GFR calc non Af Amer: 32 mL/min — ABNORMAL LOW (ref >60)
Glucose, Bld: 160 mg/dL — ABNORMAL HIGH (ref 65–99)
Glucose, Bld: 179 mg/dL — ABNORMAL HIGH (ref 65–99)
Potassium: 2.4 mmol/L — CL (ref 3.5–5.1)
Potassium: 3.2 mmol/L — ABNORMAL LOW (ref 3.5–5.1)
Sodium: 141 mmol/L (ref 135–145)
Sodium: 139 mmol/L (ref 135–145)
Total Bilirubin: 0.5 mg/dL (ref 0.3–1.2)
Total Bilirubin: 0.8 mg/dL (ref 0.3–1.2)
Total Protein: 4.4 g/dL — ABNORMAL LOW (ref 6.5–8.1)
Total Protein: 6.1 g/dL — ABNORMAL LOW (ref 6.5–8.1)

## 2015-03-21 LAB — GLUCOSE, CAPILLARY
Glucose-Capillary: 197 mg/dL — ABNORMAL HIGH (ref 65–99)
Glucose-Capillary: 149 mg/dL — ABNORMAL HIGH (ref 65–99)
Glucose-Capillary: 194 mg/dL — ABNORMAL HIGH (ref 65–99)
Glucose-Capillary: 268 mg/dL — ABNORMAL HIGH (ref 65–99)
Glucose-Capillary: 112 mg/dL — ABNORMAL HIGH (ref 65–99)

## 2015-03-21 LAB — MRSA PCR SCREENING: MRSA by PCR: NEGATIVE

## 2015-03-21 MED ORDER — METFORMIN HCL 500 MG PO TABS: 1000.0000 mg | ORAL_TABLET | Freq: Two times a day (BID) | ORAL | Status: DC

## 2015-03-21 MED ORDER — LINAGLIPTIN 5 MG PO TABS
5.0000 mg | ORAL_TABLET | Freq: Two times a day (BID) | ORAL | Status: DC
  Administered 2015-03-21 – 2015-03-23 (×5): 5 mg via ORAL
  Filled 2015-03-21 (×5): qty 1

## 2015-03-21 MED ORDER — POTASSIUM CHLORIDE CRYS ER 20 MEQ PO TBCR
40.0000 meq | EXTENDED_RELEASE_TABLET | Freq: Once | ORAL | Status: AC
  Administered 2015-03-21: 40 meq via ORAL
  Filled 2015-03-21: qty 2

## 2015-03-21 MED ORDER — GLIMEPIRIDE 2 MG PO TABS
4.0000 mg | ORAL_TABLET | Freq: Every day | ORAL | Status: DC
  Administered 2015-03-21 – 2015-03-23 (×3): 4 mg via ORAL
  Filled 2015-03-21 (×3): qty 2

## 2015-03-21 NOTE — Progress Notes (Signed)
Called report to RN on [phone] who will be taking care of patient.  

## 2015-03-21 NOTE — Progress Notes (Addendum)
[facility] PROGRESS NOTE  XI PEARCEY [id number] DOB: 1936/02/09 DOA: 03/20/2015 PCP: Maggie Font, MD  Assessment/Plan: 1. Hyperglycemia , with a hx of uncontrolled NIDDM. Likely related to recent medication noncompliance. CBG improved with Insulin infusion and IV hydration. No indication of DKA, anion gap of 3, 11/20.  HgbA1C in progress. Will resume home medications and monitor. Daughter reports that her blood sugars are normally well controlled with her home regimen.   2. AKI superimposed on CKD stage III. Creatinine is back to baseline. Will continue IVF and monitoring. Continue to hold HCTZ and ACE-I 3. Hypokalemia. Replete  4. Dehydration in the setting of polyuria and decreased PO intake, will continue IVF. 5. Hyponatremia, likely pseudohyponatremia in the setting of hyperglycemia. Resolved with IVF. 6. HLD, continue statin.  7. Essential HTN, BP elevated. Holding Tribenzor. Will continue Verapamil and use PRN Hydralazine.   Code Status: Full DVT prophylaxis: Lovenox  Family Communication:Discussed with patient who understands and has no concerns at this time. Disposition Plan: Anticipate discharge within 24 hours.     Consultants:    Procedures:    Antibiotics:  HPI/Subjective: Feels better then she did upon admission. Was able to eat breakfast without difficulty. Denies throat pain, abdominal pain, SOB.   Objective: Filed Vitals:   03/21/15 0400  BP:   Pulse:   Temp: 98.3 F (36.8 C)  Resp:     Intake/Output Summary (Last 24 hours) at 03/21/15 0710 Last data filed at 03/21/15 0500  Gross per 24 hour  Intake 1484.4 ml  Output    450 ml  Net 1034.4 ml   Filed Weights   03/20/15 1143 03/20/15 1604 03/21/15 0400  Weight: 104.327 kg (230 lb) 99.7 kg (219 lb 12.8 oz) 98.5 kg (217 lb 2.5 oz)    Exam: 8. General: NAD, looks comfortable 9. Cardiovascular: RRR, S1, S2  10. Respiratory: clear bilaterally, No wheezing, rales or  rhonchi 11. Abdomen: soft, non tender, no distention , bowel sounds normal 12. Musculoskeletal: No edema b/l  Data Reviewed: Basic Metabolic Panel:  Recent Labs Lab 03/20/15 1251 03/21/15 0501  NA 130* 141  K 4.5 2.4*  CL 89* 116*  CO2 27 22  GLUCOSE 701* 160*  BUN 51* 31*  CREATININE 2.77* 1.20*  CALCIUM 9.6 6.4*   Liver Function Tests:  Recent Labs Lab 03/21/15 0501  AST 13*  ALT 11*  ALKPHOS 46  BILITOT 0.5  PROT 4.4*  ALBUMIN 2.2*   CBC:  Recent Labs Lab 03/20/15 1251 03/21/15 0501  WBC 9.8 6.4  NEUTROABS 6.7  --   HGB 13.4 8.0*  HCT 40.4 24.3*  MCV 85.8 85.9  PLT 189 107*   CBG:  Recent Labs Lab 03/20/15 1840 03/20/15 1939 03/20/15 2055 03/20/15 2200 03/21/15 0102  GLUCAP 204* 137* 145* 163* 197*    Recent Results (from the past 240 hour(s))  MRSA PCR Screening     Status: None   Collection Time: 03/20/15  4:00 PM  Result Value Ref Range Status   MRSA by PCR NEGATIVE NEGATIVE Final    Comment:        The GeneXpert MRSA Assay (FDA approved for NASAL specimens only), is one component of a comprehensive MRSA colonization surveillance program. It is not intended to diagnose MRSA infection nor to guide or monitor treatment for MRSA infections.      Studies: Portable Chest 1 View  03/20/2015  CLINICAL DATA:  Patient with long-standing sore throat. EXAM: PORTABLE CHEST 1 VIEW COMPARISON:  Chest  radiograph 07/13/2012. FINDINGS: Multiple monitoring leads overlie the patient. Stable enlarged cardiac and mediastinal contours with tortuosity of the thoracic aorta. No consolidative pulmonary opacities. No pleural effusion or pneumothorax. IMPRESSION: No acute cardiopulmonary process. Cardiomegaly. Electronically Signed   By: Lovey Newcomer M.D.   On: 03/20/2015 16:18    Scheduled Meds: . aspirin EC  81 mg Oral Daily  . atorvastatin  40 mg Oral Daily  . enoxaparin (LOVENOX) injection  30 mg Subcutaneous Q24H  . Influenza vac split quadrivalent  PF  0.5 mL Intramuscular Tomorrow-1000  . insulin aspart  0-15 Units Subcutaneous TID WC  . insulin aspart  0-5 Units Subcutaneous QHS  . insulin detemir  20 Units Subcutaneous QHS  . pneumococcal 23 valent vaccine  0.5 mL Intramuscular Tomorrow-1000  . verapamil  80 mg Oral TID   Continuous Infusions: . sodium chloride 1,000 mL (03/21/15 0251)    Active Problems:   Essential hypertension, benign   Hyponatremia   AKI (acute kidney injury) ([place])   CKD (chronic kidney disease) stage 3, GFR 30-59 ml/min   Hyperglycemia   HLD (hyperlipidemia)   Diabetes mellitus due to underlying condition with diabetic nephropathy ([place])    Time spent: 20 minutes     Kathie Dike, MD  [facility] Pager [phone]. If 7PM-7AM, please contact night-coverage at [email or web address], password [facility] 03/21/2015, 7:10 AM  LOS: 1 day     By signing my name below, I, Rennis Harding, attest that this documentation has been prepared under the direction and in the presence of Kathie Dike, MD. Electronically signed: Rennis Harding, Scribe. 03/21/2015 8:49am   I, Dr. Kathie Dike, personally performed the services described in this documentaiton. All medical record entries made by the scribe were at my direction and in my presence. I have reviewed the chart and agree that the record reflects my personal performance and is accurate and complete  Kathie Dike, MD, 03/21/2015 9:58 AM

## 2015-03-22 LAB — BASIC METABOLIC PANEL
Anion gap: 6 (ref 5–15)
BUN: 27 mg/dL — ABNORMAL HIGH (ref 6–20)
CO2: 24 mmol/L (ref 22–32)
Calcium: 8.2 mg/dL — ABNORMAL LOW (ref 8.9–10.3)
Chloride: 106 mmol/L (ref 101–111)
Creatinine, Ser: 1.29 mg/dL — ABNORMAL HIGH (ref 0.44–1.00)
GFR calc Af Amer: 45 mL/min — ABNORMAL LOW (ref >60)
GFR calc non Af Amer: 39 mL/min — ABNORMAL LOW (ref >60)
Glucose, Bld: 226 mg/dL — ABNORMAL HIGH (ref 65–99)
Potassium: 3.4 mmol/L — ABNORMAL LOW (ref 3.5–5.1)
Sodium: 136 mmol/L (ref 135–145)

## 2015-03-22 LAB — GLUCOSE, CAPILLARY
Glucose-Capillary: 195 mg/dL — ABNORMAL HIGH (ref 65–99)
Glucose-Capillary: 283 mg/dL — ABNORMAL HIGH (ref 65–99)
Glucose-Capillary: 215 mg/dL — ABNORMAL HIGH (ref 65–99)
Glucose-Capillary: 218 mg/dL — ABNORMAL HIGH (ref 65–99)

## 2015-03-22 LAB — HEMOGLOBIN A1C
Hgb A1c MFr Bld: 15.4 % — ABNORMAL HIGH (ref 4.8–5.6)
Mean Plasma Glucose: 395 mg/dL

## 2015-03-22 MED ORDER — ENOXAPARIN SODIUM 40 MG/0.4ML ~~LOC~~ SOLN
40.0000 mg | SUBCUTANEOUS | Status: DC
  Administered 2015-03-22: 40 mg via SUBCUTANEOUS
  Filled 2015-03-22: qty 0.4

## 2015-03-22 MED ORDER — HYDRALAZINE HCL 25 MG PO TABS
25.0000 mg | ORAL_TABLET | Freq: Three times a day (TID) | ORAL | Status: DC
  Administered 2015-03-22 – 2015-03-23 (×4): 25 mg via ORAL
  Filled 2015-03-22 (×4): qty 1

## 2015-03-22 MED ORDER — METOPROLOL TARTRATE 25 MG PO TABS
25.0000 mg | ORAL_TABLET | Freq: Two times a day (BID) | ORAL | Status: DC
  Administered 2015-03-22 – 2015-03-23 (×3): 25 mg via ORAL
  Filled 2015-03-22 (×3): qty 1

## 2015-03-22 NOTE — Discharge Summary (Signed)
Physician Discharge Summary  Chelsea Meza [id number] DOB: 1935-08-03 DOA: 03/20/2015  PCP: Maggie Font, MD  Admit date: 03/20/2015 Discharge date: 03/23/2015  Time spent: 35 minutes  Recommendations for Outpatient Follow-up:  1. Follow up with PCP in 1-2 weeks for resolution of hyperglycemia and antihypertensive regimen.    Discharge Diagnoses:  Active Problems:   Essential hypertension, benign   Hyponatremia   AKI (acute kidney injury) ([place])   CKD (chronic kidney disease) stage 3, GFR 30-59 ml/min   Hyperglycemia   HLD (hyperlipidemia)   Diabetes mellitus due to underlying condition with diabetic nephropathy [facility])   Discharge Condition: Improved   Diet recommendation: Heart healthy   Filed Weights   03/20/15 1143 03/20/15 1604 03/21/15 0400  Weight: 104.327 kg (230 lb) 99.7 kg (219 lb 12.8 oz) 98.5 kg (217 lb 2.5 oz)    History of present illness:  79 y.o. female with a hx of PSVT, NIDDM, and HLD presents with complaints of a dry throat. Patient states she is typically on Amaryl and Janumet for blood sugar control however she ran out of Amaryl approximately 3 days ago. She has continued to take her Janumet as prescribed. Since she ran out of her medication, she has had polydipsia, polyuria, and a dry/sratchy throat. She denies any dysuria, fever, dizziness, weakness, nausea, vomiting, abdominal pain, CP, or SOB.  While in the ED, labs revealed a sodium of 130, creatinine 2.77, a glucose of 700, and an anion gap of 14. She was admitted for further management of hyperglycemia and AKI.   Hospital Course:  Patient was admitted for hyperglycemia, likely related to medication noncompliance, which improved with insulin infusion and IV hydration. Daughter reported that her blood sugars are normally well controlled with her home medications. Once stable,her homed medications were resumed and blood sugars have remained relatively stable. HgbA1C was noted to be 15.4. There was  no indication of DKA. She will need further adjustment in diabetic regimen with her PCP.  AKI superimposed on CKD Stage III improved with IVF and improvement in hyperglycemia. Creatinine noted to be 2.77 upon admission and improved back to baseline upon discharge. HCTZ and ACE-I were held in the setting of kidney injury.   1. Hypokalemia. Replaced 2. Dehydration in the setting of polyuria and decreased PO intake, resolved. 3. Hyponatremia, likely pseudohyponatremia in the setting of hyperglycemia. Resolved with IVF. 4. HLD, continue statin.  5. Essential HTN, improved. Tribenzor was held in the setting of recent kidney injury. Patient given Verapamil. Metoprolol and Hydralazine were added to her regimen. Instructed to follow up with PCP for further antihypertensive regimen adjustments.    Procedures:  None  Consultations:  None  Discharge Exam: Filed Vitals:   03/22/15 2033 03/23/15 0607  BP: 188/79 167/86  Pulse: 69 59  Temp: 98.2 F (36.8 C) 98.1 F (36.7 C)  Resp: 18 20     General: NAD. Appears calm and looks comfortable, lying in bed.   Cardiovascular: RRR. No m/r/g  Respiratory: clear bilaterally, No wheezing, rales or rhonchi. Normal respiratory efforts.   Abdomen: soft, non tender, no distention. Positive bowel sounds.   Musculoskeletal: No edema b/l  Discharge Instructions   Discharge Instructions    Diet - low sodium heart healthy    Complete by:  As directed      Increase activity slowly    Complete by:  As directed           Current Discharge Medication List    START taking these  medications   Details  hydrALAZINE (APRESOLINE) 50 MG tablet Take 1 tablet (50 mg total) by mouth 3 (three) times daily. Qty: 90 tablet, Refills: 1    metoprolol tartrate (LOPRESSOR) 25 MG tablet Take 1 tablet (25 mg total) by mouth 2 (two) times daily. Qty: 60 tablet, Refills: 1      CONTINUE these medications which have CHANGED   Details  atorvastatin (LIPITOR)  40 MG tablet Take 1 tablet (40 mg total) by mouth daily. Qty: 30 tablet, Refills: 0    glimepiride (AMARYL) 4 MG tablet Take 2 tablets (8 mg total) by mouth daily with breakfast. Qty: 60 tablet, Refills: 0    sitaGLIPtin-metformin (JANUMET) 50-1000 MG tablet Take 1 tablet by mouth 2 (two) times daily with a meal. Qty: 60 tablet, Refills: 1    verapamil (CALAN) 80 MG tablet Take 1 tablet (80 mg total) by mouth 3 (three) times daily. Qty: 90 tablet, Refills: 0      CONTINUE these medications which have NOT CHANGED   Details  aspirin EC 81 MG tablet Take 81 mg by mouth daily.      STOP taking these medications     Olmesartan-Amlodipine-HCTZ (TRIBENZOR) 40-5-25 MG TABS        No Known Allergies    The results of significant diagnostics from this hospitalization (including imaging, microbiology, ancillary and laboratory) are listed below for reference.    Significant Diagnostic Studies: Portable Chest 1 View  03/20/2015  CLINICAL DATA:  Patient with long-standing sore throat. EXAM: PORTABLE CHEST 1 VIEW COMPARISON:  Chest radiograph 07/13/2012. FINDINGS: Multiple monitoring leads overlie the patient. Stable enlarged cardiac and mediastinal contours with tortuosity of the thoracic aorta. No consolidative pulmonary opacities. No pleural effusion or pneumothorax. IMPRESSION: No acute cardiopulmonary process. Cardiomegaly. Electronically Signed   By: Lovey Newcomer M.D.   On: 03/20/2015 16:18    Microbiology: Recent Results (from the past 240 hour(s))  MRSA PCR Screening     Status: None   Collection Time: 03/20/15  4:00 PM  Result Value Ref Range Status   MRSA by PCR NEGATIVE NEGATIVE Final    Comment:        The GeneXpert MRSA Assay (FDA approved for NASAL specimens only), is one component of a comprehensive MRSA colonization surveillance program. It is not intended to diagnose MRSA infection nor to guide or monitor treatment for MRSA infections.      Labs: Basic  Metabolic Panel:  Recent Labs Lab 03/20/15 1251 03/21/15 0501 03/21/15 0702 03/22/15 0548  NA 130* 141 139 136  K 4.5 2.4* 3.2* 3.4*  CL 89* 116* 104 106  CO2 27 22 27 24   GLUCOSE 701* 160* 179* 226*  BUN 51* 31* 37* 27*  CREATININE 2.77* 1.20* 1.52* 1.29*  CALCIUM 9.6 6.4* 8.5* 8.2*   Liver Function Tests:  Recent Labs Lab 03/21/15 0501 03/21/15 0702  AST 13* 17  ALT 11* 13*  ALKPHOS 46 65  BILITOT 0.5 0.8  PROT 4.4* 6.1*  ALBUMIN 2.2* 3.0*   CBC:  Recent Labs Lab 03/20/15 1251 03/21/15 0501 03/21/15 0702  WBC 9.8 6.4 7.2  NEUTROABS 6.7  --   --   HGB 13.4 8.0* 10.7*  HCT 40.4 24.3* 32.7*  MCV 85.8 85.9 85.6  PLT 189 107* 140*   CBG:  Recent Labs Lab 03/22/15 0745 03/22/15 1131 03/22/15 1636 03/22/15 2032 03/23/15 0747  GLUCAP 195* 283* 215* 218* 196*       Signed:  Kathie Dike, MD  [facility] 03/23/2015, 10:07 AM    By signing my name below, I, Rennis Harding, attest that this documentation has been prepared under the direction and in the presence of Kathie Dike, MD. Electronically signed: Rennis Harding, Scribe. 03/23/2015 9:50am  I, Dr. Kathie Dike, personally performed the services described in this documentaiton. All medical record entries made by the scribe were at my direction and in my presence. I have reviewed the chart and agree that the record reflects my personal performance and is accurate and complete  Kathie Dike, MD, 03/23/2015 10:07 AM

## 2015-03-22 NOTE — Care Management Note (Signed)
Case Management Note  Patient Details  Name: Chelsea Meza MRN: [id number] Date of Birth: [birth year]  Subjective/Objective:                  Pt admitted from home with AKI and hyperglycemia. Pt lives alone and will return home at discharge. Pt is independent with ADL's.  Action/Plan: No CM needs anticipated.  Expected Discharge Date:  03/23/15               Expected Discharge Plan:  Home/Self Care  In-House Referral:  NA  Discharge planning Services  CM Consult  Post Acute Care Choice:  NA Choice offered to:  NA  DME Arranged:    DME Agency:     HH Arranged:    HH Agency:     Status of Service:  Completed, signed off  Medicare Important Message Given:    Date Medicare IM Given:    Medicare IM give by:    Date Additional Medicare IM Given:    Additional Medicare Important Message give by:     If discussed at [place] of Stay Meetings, dates discussed:    Additional Comments:  Joylene Draft, RN 03/22/2015, 1:21 PM

## 2015-03-22 NOTE — Progress Notes (Signed)
[facility] PROGRESS NOTE  BRADEE ZHAI [id number] DOB: 1935/06/12 DOA: 03/20/2015 PCP: Maggie Font, MD  Assessment/Plan: 1. Hyperglycemia, with a hx of uncontrolled NIDDM. Likely related to recent medication noncompliance. CBG improved with Insulin infusion and IV hydration. No indication of DKA.. HgbA1C still in process. Will continue home medications and monitor. Daughter reports that her blood sugars are normally well controlled with her home regimen.   2. AKI superimposed on CKD stage III. Creatinine is back to baseline. Will continue IVF and monitoring. Continue to hold HCTZ and ACE-I. 3. Hypokalemia. Replete  4. Dehydration in the setting of polyuria and decreased PO intake, will continue IVF. 5. Hyponatremia, likely pseudohyponatremia in the setting of hyperglycemia. Resolved with IVF. 6. HLD, continue statin.  7. Essential HTN, uncontrolled. Will continue to hold Tribenzor in the setting of recent kidney injury. Will continue Verapamil. Metoprolol and Hydralazine were added to her regimen.   Code Status: Full DVT prophylaxis: Lovenox  Family Communication:Discussed with patient who understands and has no concerns at this time. Disposition Plan: Anticipate discharge within 24 hours.     Consultants:    Procedures:    Antibiotics:  HPI/Subjective: Feels well. Breathing is fine. Denies any chest pain or SOB. Her blood pressure is normally well controlled with medications. Urinating without issues.   Objective: Filed Vitals:   03/22/15 0615 03/22/15 0827  BP: 197/94 215/92  Pulse: 74   Temp: 98 F (36.7 C)   Resp: 20     Intake/Output Summary (Last 24 hours) at 03/22/15 1004 Last data filed at 03/22/15 0555  Gross per 24 hour  Intake   2055 ml  Output      0 ml  Net   2055 ml   Filed Weights   03/20/15 1143 03/20/15 1604 03/21/15 0400  Weight: 104.327 kg (230 lb) 99.7 kg (219 lb 12.8 oz) 98.5 kg (217 lb 2.5 oz)    Exam: General: NAD,  looks comfortable Cardiovascular: RRR, S1, S2  Respiratory: clear bilaterally, No wheezing, rales or rhonchi Abdomen: soft, non tender, no distention  Musculoskeletal: No edema b/l  Data Reviewed: Basic Metabolic Panel:  Recent Labs Lab 03/20/15 1251 03/21/15 0501 03/21/15 0702 03/22/15 0548  NA 130* 141 139 136  K 4.5 2.4* 3.2* 3.4*  CL 89* 116* 104 106  CO2 27 22 27 24   GLUCOSE 701* 160* 179* 226*  BUN 51* 31* 37* 27*  CREATININE 2.77* 1.20* 1.52* 1.29*  CALCIUM 9.6 6.4* 8.5* 8.2*   Liver Function Tests:  Recent Labs Lab 03/21/15 0501 03/21/15 0702  AST 13* 17  ALT 11* 13*  ALKPHOS 46 65  BILITOT 0.5 0.8  PROT 4.4* 6.1*  ALBUMIN 2.2* 3.0*   CBC:  Recent Labs Lab 03/20/15 1251 03/21/15 0501 03/21/15 0702  WBC 9.8 6.4 7.2  NEUTROABS 6.7  --   --   HGB 13.4 8.0* 10.7*  HCT 40.4 24.3* 32.7*  MCV 85.8 85.9 85.6  PLT 189 107* 140*   CBG:  Recent Labs Lab 03/21/15 0722 03/21/15 1139 03/21/15 1553 03/21/15 2146 03/22/15 0745  GLUCAP 149* 194* 268* 112* 195*    Recent Results (from the past 240 hour(s))  MRSA PCR Screening     Status: None   Collection Time: 03/20/15  4:00 PM  Result Value Ref Range Status   MRSA by PCR NEGATIVE NEGATIVE Final    Comment:        The GeneXpert MRSA Assay (FDA approved for NASAL specimens only), is one component of  a comprehensive MRSA colonization surveillance program. It is not intended to diagnose MRSA infection nor to guide or monitor treatment for MRSA infections.      Studies: Portable Chest 1 View  03/20/2015  CLINICAL DATA:  Patient with long-standing sore throat. EXAM: PORTABLE CHEST 1 VIEW COMPARISON:  Chest radiograph 07/13/2012. FINDINGS: Multiple monitoring leads overlie the patient. Stable enlarged cardiac and mediastinal contours with tortuosity of the thoracic aorta. No consolidative pulmonary opacities. No pleural effusion or pneumothorax. IMPRESSION: No acute cardiopulmonary process.  Cardiomegaly. Electronically Signed   By: Lovey Newcomer M.D.   On: 03/20/2015 16:18    Scheduled Meds: . aspirin EC  81 mg Oral Daily  . atorvastatin  40 mg Oral Daily  . enoxaparin (LOVENOX) injection  30 mg Subcutaneous Q24H  . glimepiride  4 mg Oral Q breakfast  . hydrALAZINE  25 mg Oral TID  . insulin aspart  0-15 Units Subcutaneous TID WC  . insulin aspart  0-5 Units Subcutaneous QHS  . linagliptin  5 mg Oral BID  . verapamil  80 mg Oral TID   Continuous Infusions: . sodium chloride 125 mL/hr at 03/22/15 [id number]    Active Problems:   Essential hypertension, benign   Hyponatremia   AKI (acute kidney injury) ([place])   CKD (chronic kidney disease) stage 3, GFR 30-59 ml/min   Hyperglycemia   HLD (hyperlipidemia)   Diabetes mellitus due to underlying condition with diabetic nephropathy ([place])    Time spent: 20 minutes     Kathie Dike, MD  [facility] Pager [phone]. If 7PM-7AM, please contact night-coverage at [email or web address], password [facility] 03/22/2015, 10:04 AM  LOS: 2 days     By signing my name below, I, Rennis Harding, attest that this documentation has been prepared under the direction and in the presence of Kathie Dike, MD. Electronically signed: Rennis Harding, Scribe. 03/22/2015 10:09am  I, Dr. Kathie Dike, personally performed the services described in this documentaiton. All medical record entries made by the scribe were at my direction and in my presence. I have reviewed the chart and agree that the record reflects my personal performance and is accurate and complete  Kathie Dike, MD, 03/22/2015 10:13 AM

## 2015-03-23 LAB — GLUCOSE, CAPILLARY: Glucose-Capillary: 196 mg/dL — ABNORMAL HIGH (ref 65–99)

## 2015-03-23 MED ORDER — VERAPAMIL HCL 80 MG PO TABS: 80.0000 mg | ORAL_TABLET | Freq: Three times a day (TID) | ORAL | Status: DC

## 2015-03-23 MED ORDER — ATORVASTATIN CALCIUM 40 MG PO TABS: 40.0000 mg | ORAL_TABLET | Freq: Every day | ORAL | Status: DC

## 2015-03-23 MED ORDER — METOPROLOL TARTRATE 25 MG PO TABS: 25.0000 mg | ORAL_TABLET | Freq: Two times a day (BID) | ORAL | Status: DC

## 2015-03-23 MED ORDER — HYDRALAZINE HCL 50 MG PO TABS: 50.0000 mg | ORAL_TABLET | Freq: Three times a day (TID) | ORAL | Status: DC

## 2015-03-23 MED ORDER — SITAGLIPTIN PHOS-METFORMIN HCL 50-1000 MG PO TABS: 1.0000 | ORAL_TABLET | Freq: Two times a day (BID) | ORAL | Status: DC

## 2015-03-23 MED ORDER — GLIMEPIRIDE 4 MG PO TABS: 8.0000 mg | ORAL_TABLET | Freq: Every day | ORAL | Status: DC

## 2015-03-23 NOTE — Progress Notes (Signed)
Inpatient Diabetes Program Recommendations  AACE/ADA: New Consensus Statement on Inpatient Glycemic Control (2015)  Target Ranges:  Prepandial:   less than 140 mg/dL      Peak postprandial:   less than 180 mg/dL (1-2 hours)      Critically ill patients:  140 - 180 mg/dL  Results for BRANDYE, DERYKE (MRN [id number]) as of 03/23/2015 07:57  Ref. Range 03/22/2015 07:45 03/22/2015 11:31 03/22/2015 16:36 03/22/2015 20:32  Glucose-Capillary Latest Ref Range: 65-99 mg/dL 195 (H) 283 (H) 215 (H) 218 (H)   Review of Glycemic Control  Diabetes history: DM2 Outpatient Diabetes medications: Amaryl 4 mg QAM, Janumet 50-1000 mg BID Current orders for Inpatient glycemic control: Amaryl 4 mg QAM, Tradjenta 5 mg BID, Novolog 0-15 units TID with meals, Novolog 0-5 units QHS  Inpatient Diabetes Program Recommendations: Insulin - Basal: Please consider ordering low dose basal insulin. Recommend starting with Lantus 10 units daily (based on 98.5 kg x 0.1 units). HgbA1C: A1C 15.4% on 03/20/2015 indicating very poor glycemic control. Patient likely needs to add low dose basal insulin to DM medication regimen.   Note: If patient will be discharged on insulin, please inform Nursing staff so that patient and her family can be educated prior to discharge.  Thanks, Barnie Alderman, RN, MSN, CDE Diabetes Coordinator Inpatient Diabetes Program [phone] (Team Pager from [place] to [place]) [phone] ([place] office) [phone] [facility] office) [phone] [facility] office)

## 2015-03-23 NOTE — Progress Notes (Signed)
Patient with orders to be discharge home. Discharge instructions given, patient verbalized understanding. Prescriptions given. Patient stable. Patient left facility with family.

## 2015-03-23 NOTE — Care Management Note (Signed)
Case Management Note  Patient Details  Name: Chelsea Meza MRN: [id number] Date of Birth: 06/29/1935  Pt discharging home today with self care. No CM needs.   Expected Discharge Date:  03/23/15               Expected Discharge Plan:  Home/Self Care  In-House Referral:  NA  Discharge planning Services  CM Consult  Post Acute Care Choice:  NA Choice offered to:  NA  DME Arranged:    DME Agency:     HH Arranged:    HH Agency:     Status of Service:  Completed, signed off  Medicare Important Message Given:  Yes Date Medicare IM Given:    Medicare IM give by:    Date Additional Medicare IM Given:    Additional Medicare Important Message give by:     If discussed at [place] of Stay Meetings, dates discussed:    Additional Comments:  Sherald Barge, RN 03/23/2015, 11:59 AM

## 2015-03-23 NOTE — Care Management Important Message (Signed)
Important Message  Patient Details  Name: Chelsea Meza MRN: [id number] Date of Birth: 1936-03-29   Medicare Important Message Given:  Yes    Sherald Barge, RN 03/23/2015, 11:58 AM

## 2015-04-03 ENCOUNTER — Encounter (HOSPITAL_COMMUNITY): Payer: Self-pay | Admitting: Emergency Medicine

## 2015-04-03 ENCOUNTER — Emergency Department (HOSPITAL_COMMUNITY)
Admission: EM | Admit: 2015-04-03 | Discharge: 2015-04-03 | Disposition: A | Discharge status: Home / Self Care | Payer: Medicare Other | Attending: Emergency Medicine | Admitting: Emergency Medicine

## 2015-04-03 DIAGNOSIS — K921 Melena: Secondary | ICD-10-CM | POA: Diagnosis not present

## 2015-04-03 DIAGNOSIS — R195 Other fecal abnormalities: Secondary | ICD-10-CM | POA: Diagnosis not present

## 2015-04-03 DIAGNOSIS — E119 Type 2 diabetes mellitus without complications: Secondary | ICD-10-CM | POA: Diagnosis not present

## 2015-04-03 DIAGNOSIS — Z7982 Long term (current) use of aspirin: Secondary | ICD-10-CM | POA: Insufficient documentation

## 2015-04-03 DIAGNOSIS — I10 Essential (primary) hypertension: Secondary | ICD-10-CM | POA: Diagnosis not present

## 2015-04-03 DIAGNOSIS — R51 Headache: Secondary | ICD-10-CM | POA: Diagnosis not present

## 2015-04-03 DIAGNOSIS — Z79899 Other long term (current) drug therapy: Secondary | ICD-10-CM | POA: Diagnosis not present

## 2015-04-03 LAB — CBC WITH DIFFERENTIAL/PLATELET
Basophils Absolute: 0.1 10*3/uL (ref 0.0–0.1)
Basophils Relative: 1 %
Eosinophils Absolute: 0.2 10*3/uL (ref 0.0–0.7)
Eosinophils Relative: 3 %
HCT: 31.6 % — ABNORMAL LOW (ref 36.0–46.0)
Hemoglobin: 10.2 g/dL — ABNORMAL LOW (ref 12.0–15.0)
Lymphocytes Relative: 28 %
Lymphs Abs: 2.1 10*3/uL (ref 0.7–4.0)
MCH: 28.2 pg (ref 26.0–34.0)
MCHC: 32.3 g/dL (ref 30.0–36.0)
MCV: 87.3 fL (ref 78.0–100.0)
Monocytes Absolute: 0.6 10*3/uL (ref 0.1–1.0)
Monocytes Relative: 7 %
Neutro Abs: 4.6 10*3/uL (ref 1.7–7.7)
Neutrophils Relative %: 61 %
Platelets: 212 10*3/uL (ref 150–400)
RBC: 3.62 MIL/uL — ABNORMAL LOW (ref 3.87–5.11)
RDW: 14.5 % (ref 11.5–15.5)
WBC: 7.6 10*3/uL (ref 4.0–10.5)

## 2015-04-03 LAB — BASIC METABOLIC PANEL
Anion gap: 12 (ref 5–15)
BUN: 22 mg/dL — ABNORMAL HIGH (ref 6–20)
CO2: 22 mmol/L (ref 22–32)
Calcium: 8.6 mg/dL — ABNORMAL LOW (ref 8.9–10.3)
Chloride: 104 mmol/L (ref 101–111)
Creatinine, Ser: 1.44 mg/dL — ABNORMAL HIGH (ref 0.44–1.00)
GFR calc Af Amer: 39 mL/min — ABNORMAL LOW (ref >60)
GFR calc non Af Amer: 34 mL/min — ABNORMAL LOW (ref >60)
Glucose, Bld: 168 mg/dL — ABNORMAL HIGH (ref 65–99)
Potassium: 4 mmol/L (ref 3.5–5.1)
Sodium: 138 mmol/L (ref 135–145)

## 2015-04-03 LAB — POC OCCULT BLOOD, ED: Fecal Occult Bld: POSITIVE — AB

## 2015-04-03 NOTE — ED Provider Notes (Signed)
CSN: [id number]     Arrival date & time 04/03/15  0716 History   First MD Initiated Contact with Patient 04/03/15 [id number]     Chief Complaint  Patient presents with  . Rectal Bleeding     (Consider location/radiation/quality/duration/timing/severity/associated sxs/prior Treatment) HPI Comments: 79 year old female with history of SVT renal failure, DKA, lipids, hemorrhoid 8 years ago with last colonoscopy presents with Timentin bright red blood in the rectum since last night positive for episodes. No dizziness or lightheadedness. Aspirin is only blood thinner. No abdominal pain mild intermittent nausea. Patient feels well otherwise. No vascular disease known.  Patient is a 79 y.o. female presenting with hematochezia. The history is provided by the patient.  Rectal Bleeding Associated symptoms: no abdominal pain, no fever, no light-headedness and no vomiting     Past Medical History  Diagnosis Date  . Diabetes mellitus without complication ([place])   . Hypertension    Past Surgical History  Procedure Laterality Date  . Cholecystectomy     History reviewed. No pertinent family history. Social History  Substance Use Topics  . Smoking status: Never Smoker   . Smokeless tobacco: Never Used  . Alcohol Use: No   OB History    No data available     Review of Systems  Constitutional: Negative for fever and chills.  HENT: Negative for congestion.   Eyes: Negative for visual disturbance.  Respiratory: Negative for shortness of breath.   Cardiovascular: Negative for chest pain.  Gastrointestinal: Positive for blood in stool and hematochezia. Negative for vomiting and abdominal pain.  Genitourinary: Negative for dysuria and flank pain.  Musculoskeletal: Negative for back pain, neck pain and neck stiffness.  Skin: Negative for rash.  Neurological: Positive for headaches (mild similar previous). Negative for light-headedness.      Allergies  Review of patient's allergies indicates no  known allergies.  Home Medications   Prior to Admission medications   Medication Sig Start Date End Date Taking? Authorizing Provider  aspirin EC 81 MG tablet Take 81 mg by mouth daily.    Historical Provider, MD  atorvastatin (LIPITOR) 40 MG tablet Take 1 tablet (40 mg total) by mouth daily. 03/23/15   Kathie Dike, MD  glimepiride (AMARYL) 4 MG tablet Take 2 tablets (8 mg total) by mouth daily with breakfast. 03/23/15   Kathie Dike, MD  hydrALAZINE (APRESOLINE) 50 MG tablet Take 1 tablet (50 mg total) by mouth 3 (three) times daily. 03/23/15   Kathie Dike, MD  metoprolol tartrate (LOPRESSOR) 25 MG tablet Take 1 tablet (25 mg total) by mouth 2 (two) times daily. 03/23/15   Kathie Dike, MD  sitaGLIPtin-metformin (JANUMET) 50-1000 MG tablet Take 1 tablet by mouth 2 (two) times daily with a meal. 03/23/15   Kathie Dike, MD  verapamil (CALAN) 80 MG tablet Take 1 tablet (80 mg total) by mouth 3 (three) times daily. 03/23/15   Kathie Dike, MD   BP 168/85 mmHg  Pulse 90  Temp(Src) 98.2 F (36.8 C) (Oral)  Resp 16  Ht 5\' 7"  (1.702 m)  Wt 217 lb (98.431 kg)  BMI 33.98 kg/m2  SpO2 100% Physical Exam  Constitutional: She is oriented to person, place, and time. She appears well-developed and well-nourished.  HENT:  Head: Normocephalic and atraumatic.  Eyes: Conjunctivae are normal. Right eye exhibits no discharge. Left eye exhibits no discharge.  Neck: Normal range of motion. Neck supple. No tracheal deviation present.  Cardiovascular: Normal rate and regular rhythm.   Pulmonary/Chest: Effort normal and breath  sounds normal.  Abdominal: Soft. She exhibits no distension. There is no tenderness. There is no guarding.  Genitourinary:  Brown stool, no fissures or hemorrhoids appreciated, skin tag noted, large body habitus  Musculoskeletal: She exhibits no edema.  Neurological: She is alert and oriented to person, place, and time.  Skin: Skin is warm. No rash noted.   Psychiatric: She has a normal mood and affect.  Nursing note and vitals reviewed.   ED Course  Procedures (including critical care time) Labs Review Labs Reviewed  CBC WITH DIFFERENTIAL/PLATELET - Abnormal; Notable for the following:    RBC 3.62 (*)    Hemoglobin 10.2 (*)    HCT 31.6 (*)    All other components within normal limits  POC OCCULT BLOOD, ED - Abnormal; Notable for the following:    Fecal Occult Bld POSITIVE (*)    All other components within normal limits  OCCULT BLOOD X 1 CARD TO LAB, STOOL  BASIC METABOLIC PANEL    Imaging Review No results found. I have personally reviewed and evaluated these images and lab results as part of my medical decision-making.   EKG Interpretation None      MDM   Final diagnoses:  Blood in stool   Patient presents after multiple episodes of small amount of bright red blood in the stool. Brown stool in the ER. No episodes of bleeding in the ER. Mild elevated blood pressure no tachycardia. Patient's well-appearing on reassessment. Discussed strict reasons return and to follow-up with gastroenterology early next week. Hemoglobin stable.  Results and differential diagnosis were discussed with the patient/parent/guardian. Xrays were independently reviewed by myself.  Close follow up outpatient was discussed, comfortable with the plan.   Medications - No data to display  Filed Vitals:   04/03/15 0725  BP: 168/85  Pulse: 90  Temp: 98.2 F (36.8 C)  TempSrc: Oral  Resp: 16  Height: 5\' 7"  (1.702 m)  Weight: 217 lb (98.431 kg)  SpO2: 100%    Final diagnoses:  Blood in stool        Elnora Morrison, MD 04/03/15 [phone]

## 2015-04-03 NOTE — ED Notes (Signed)
Pt states that she has been having some rectal pain with bright red rectal bleeding since last night.

## 2015-04-03 NOTE — Discharge Instructions (Signed)
Return if he started to feel dizzy, lightheaded or pass out. Return for persistent or increased bleeding.  If you were given medicines take as directed.  If you are on coumadin or contraceptives realize their levels and effectiveness is altered by many different medicines.  If you have any reaction (rash, tongues swelling, other) to the medicines stop taking and see a physician.    If your blood pressure was elevated in the ER make sure you follow up for management with a primary doctor or return for chest pain, shortness of breath or stroke symptoms.  Please follow up as directed and return to the ER or see a physician for new or worsening symptoms.  Thank you. Filed Vitals:   04/03/15 0725  BP: 168/85  Pulse: 90  Temp: 98.2 F (36.8 C)  TempSrc: Oral  Resp: 16  Height: 5\' 7"  (1.702 m)  Weight: 217 lb (98.431 kg)  SpO2: 100%    Gastrointestinal Bleeding Gastrointestinal bleeding is bleeding somewhere along the path that food travels through the body (digestive tract). This path is anywhere between the mouth and the opening of the butt (anus). You may have blood in your throw up (vomit) or in your poop (stools). If there is a lot of bleeding, you may need to stay in the hospital. [place]  Only take medicine as told by your doctor.  Eat foods with fiber such as whole grains, fruits, and vegetables. You can also try eating 1 to 3 prunes a day.  Drink enough fluids to keep your pee (urine) clear or pale yellow. GET HELP RIGHT AWAY IF:   Your bleeding gets worse.  You feel dizzy, weak, or you pass out (faint).  You have bad cramps in your back or belly (abdomen).  You have large blood clumps (clots) in your poop.  Your problems are getting worse. MAKE SURE YOU:   Understand these instructions.  Will watch your condition.  Will get help right away if you are not doing well or get worse.   This information is not intended to replace advice given to you by your health care  provider. Make sure you discuss any questions you have with your health care provider.   Document Released: 01/25/2008 Document Revised: 04/03/2012 Document Reviewed: 10/05/2014 Elsevier Interactive Patient Education [employer].

## 2015-04-03 NOTE — ED Notes (Signed)
Pt. Given diet ginger ale to drink.

## 2015-04-03 NOTE — ED Notes (Signed)
Pt. D/c papers given and reviewed. All questions answered. Pt. Declined wheelchair, ambulatory out of dept. With steady gait to lobby to wait on son who is < 5 min away.

## 2015-07-03 DIAGNOSIS — N183 Chronic kidney disease, stage 3 (moderate): Secondary | ICD-10-CM | POA: Diagnosis not present

## 2015-07-03 DIAGNOSIS — E785 Hyperlipidemia, unspecified: Secondary | ICD-10-CM | POA: Diagnosis not present

## 2015-07-03 DIAGNOSIS — M130 Polyarthritis, unspecified: Secondary | ICD-10-CM | POA: Diagnosis not present

## 2015-07-03 DIAGNOSIS — E1122 Type 2 diabetes mellitus with diabetic chronic kidney disease: Secondary | ICD-10-CM | POA: Diagnosis not present

## 2015-07-03 DIAGNOSIS — E1121 Type 2 diabetes mellitus with diabetic nephropathy: Secondary | ICD-10-CM | POA: Diagnosis not present

## 2015-07-03 DIAGNOSIS — I10 Essential (primary) hypertension: Secondary | ICD-10-CM | POA: Diagnosis not present

## 2015-10-30 DIAGNOSIS — I10 Essential (primary) hypertension: Secondary | ICD-10-CM | POA: Diagnosis not present

## 2015-10-30 DIAGNOSIS — E1122 Type 2 diabetes mellitus with diabetic chronic kidney disease: Secondary | ICD-10-CM | POA: Diagnosis not present

## 2015-10-30 DIAGNOSIS — N183 Chronic kidney disease, stage 3 (moderate): Secondary | ICD-10-CM | POA: Diagnosis not present

## 2015-10-30 DIAGNOSIS — M130 Polyarthritis, unspecified: Secondary | ICD-10-CM | POA: Diagnosis not present

## 2015-10-30 DIAGNOSIS — E1121 Type 2 diabetes mellitus with diabetic nephropathy: Secondary | ICD-10-CM | POA: Diagnosis not present

## 2016-06-13 DIAGNOSIS — I10 Essential (primary) hypertension: Secondary | ICD-10-CM | POA: Diagnosis not present

## 2016-06-13 DIAGNOSIS — N183 Chronic kidney disease, stage 3 (moderate): Secondary | ICD-10-CM | POA: Diagnosis not present

## 2016-06-13 DIAGNOSIS — E1122 Type 2 diabetes mellitus with diabetic chronic kidney disease: Secondary | ICD-10-CM | POA: Diagnosis not present

## 2016-11-22 DIAGNOSIS — E785 Hyperlipidemia, unspecified: Secondary | ICD-10-CM | POA: Diagnosis not present

## 2016-11-22 DIAGNOSIS — I10 Essential (primary) hypertension: Secondary | ICD-10-CM | POA: Diagnosis not present

## 2016-11-22 DIAGNOSIS — N183 Chronic kidney disease, stage 3 (moderate): Secondary | ICD-10-CM | POA: Diagnosis not present

## 2016-11-22 DIAGNOSIS — E1122 Type 2 diabetes mellitus with diabetic chronic kidney disease: Secondary | ICD-10-CM | POA: Diagnosis not present

## 2017-01-27 DIAGNOSIS — Z23 Encounter for immunization: Secondary | ICD-10-CM | POA: Diagnosis not present

## 2017-01-27 DIAGNOSIS — I10 Essential (primary) hypertension: Secondary | ICD-10-CM | POA: Diagnosis not present

## 2017-01-27 DIAGNOSIS — N184 Chronic kidney disease, stage 4 (severe): Secondary | ICD-10-CM | POA: Diagnosis not present

## 2017-01-27 DIAGNOSIS — E1122 Type 2 diabetes mellitus with diabetic chronic kidney disease: Secondary | ICD-10-CM | POA: Diagnosis not present

## 2017-03-10 DIAGNOSIS — E118 Type 2 diabetes mellitus with unspecified complications: Secondary | ICD-10-CM | POA: Diagnosis not present

## 2017-03-10 DIAGNOSIS — I10 Essential (primary) hypertension: Secondary | ICD-10-CM | POA: Diagnosis not present

## 2017-03-31 DIAGNOSIS — N186 End stage renal disease: Secondary | ICD-10-CM | POA: Diagnosis not present

## 2017-03-31 DIAGNOSIS — I10 Essential (primary) hypertension: Secondary | ICD-10-CM | POA: Diagnosis not present

## 2017-03-31 DIAGNOSIS — E1122 Type 2 diabetes mellitus with diabetic chronic kidney disease: Secondary | ICD-10-CM | POA: Diagnosis not present

## 2017-03-31 DIAGNOSIS — N184 Chronic kidney disease, stage 4 (severe): Secondary | ICD-10-CM | POA: Diagnosis not present

## 2017-03-31 DIAGNOSIS — E1165 Type 2 diabetes mellitus with hyperglycemia: Secondary | ICD-10-CM | POA: Diagnosis not present

## 2017-11-05 ENCOUNTER — Other Ambulatory Visit: Payer: Self-pay

## 2017-11-05 ENCOUNTER — Observation Stay (HOSPITAL_COMMUNITY): Payer: Medicare Other

## 2017-11-05 ENCOUNTER — Observation Stay (HOSPITAL_COMMUNITY)
Admission: EM | Admit: 2017-11-05 | Discharge: 2017-11-07 | Disposition: A | Discharge status: Home / Self Care | Payer: Medicare Other | Attending: Internal Medicine | Admitting: Internal Medicine

## 2017-11-05 ENCOUNTER — Emergency Department (HOSPITAL_COMMUNITY): Payer: Medicare Other

## 2017-11-05 ENCOUNTER — Encounter (HOSPITAL_COMMUNITY): Payer: Self-pay | Admitting: Emergency Medicine

## 2017-11-05 DIAGNOSIS — I6789 Other cerebrovascular disease: Secondary | ICD-10-CM | POA: Diagnosis not present

## 2017-11-05 DIAGNOSIS — E1110 Type 2 diabetes mellitus with ketoacidosis without coma: Secondary | ICD-10-CM | POA: Diagnosis present

## 2017-11-05 DIAGNOSIS — E876 Hypokalemia: Secondary | ICD-10-CM | POA: Diagnosis present

## 2017-11-05 DIAGNOSIS — E1122 Type 2 diabetes mellitus with diabetic chronic kidney disease: Secondary | ICD-10-CM | POA: Diagnosis not present

## 2017-11-05 DIAGNOSIS — Z7984 Long term (current) use of oral hypoglycemic drugs: Secondary | ICD-10-CM | POA: Insufficient documentation

## 2017-11-05 DIAGNOSIS — I129 Hypertensive chronic kidney disease with stage 1 through stage 4 chronic kidney disease, or unspecified chronic kidney disease: Secondary | ICD-10-CM | POA: Diagnosis not present

## 2017-11-05 DIAGNOSIS — I10 Essential (primary) hypertension: Secondary | ICD-10-CM | POA: Diagnosis present

## 2017-11-05 DIAGNOSIS — H547 Unspecified visual loss: Principal | ICD-10-CM | POA: Insufficient documentation

## 2017-11-05 DIAGNOSIS — I700 Atherosclerosis of aorta: Secondary | ICD-10-CM | POA: Diagnosis not present

## 2017-11-05 DIAGNOSIS — I160 Hypertensive urgency: Secondary | ICD-10-CM | POA: Diagnosis not present

## 2017-11-05 DIAGNOSIS — N183 Chronic kidney disease, stage 3 unspecified: Secondary | ICD-10-CM | POA: Diagnosis present

## 2017-11-05 DIAGNOSIS — Z7982 Long term (current) use of aspirin: Secondary | ICD-10-CM | POA: Diagnosis not present

## 2017-11-05 DIAGNOSIS — N179 Acute kidney failure, unspecified: Secondary | ICD-10-CM | POA: Diagnosis present

## 2017-11-05 DIAGNOSIS — R2681 Unsteadiness on feet: Secondary | ICD-10-CM | POA: Insufficient documentation

## 2017-11-05 DIAGNOSIS — H538 Other visual disturbances: Secondary | ICD-10-CM | POA: Diagnosis not present

## 2017-11-05 DIAGNOSIS — Z9049 Acquired absence of other specified parts of digestive tract: Secondary | ICD-10-CM | POA: Diagnosis not present

## 2017-11-05 DIAGNOSIS — I63532 Cerebral infarction due to unspecified occlusion or stenosis of left posterior cerebral artery: Secondary | ICD-10-CM | POA: Diagnosis not present

## 2017-11-05 DIAGNOSIS — E785 Hyperlipidemia, unspecified: Secondary | ICD-10-CM | POA: Diagnosis present

## 2017-11-05 DIAGNOSIS — E0821 Diabetes mellitus due to underlying condition with diabetic nephropathy: Secondary | ICD-10-CM | POA: Diagnosis present

## 2017-11-05 DIAGNOSIS — I471 Supraventricular tachycardia, unspecified: Secondary | ICD-10-CM | POA: Diagnosis present

## 2017-11-05 DIAGNOSIS — Z8673 Personal history of transient ischemic attack (TIA), and cerebral infarction without residual deficits: Secondary | ICD-10-CM | POA: Insufficient documentation

## 2017-11-05 DIAGNOSIS — Z79899 Other long term (current) drug therapy: Secondary | ICD-10-CM | POA: Diagnosis not present

## 2017-11-05 DIAGNOSIS — G459 Transient cerebral ischemic attack, unspecified: Secondary | ICD-10-CM | POA: Diagnosis present

## 2017-11-05 DIAGNOSIS — R4020 Unspecified coma: Secondary | ICD-10-CM | POA: Diagnosis not present

## 2017-11-05 HISTORY — DX: Hyperlipidemia, unspecified: E78.5

## 2017-11-05 HISTORY — DX: Chronic kidney disease, stage 3 (moderate): N18.3

## 2017-11-05 HISTORY — DX: Chronic kidney disease, stage 3 unspecified: N18.30

## 2017-11-05 LAB — DIFFERENTIAL
Basophils Absolute: 0.1 10*3/uL (ref 0.0–0.1)
Basophils Relative: 1 %
Eosinophils Absolute: 0.3 10*3/uL (ref 0.0–0.7)
Eosinophils Relative: 3 %
Lymphocytes Relative: 31 %
Lymphs Abs: 3.3 10*3/uL (ref 0.7–4.0)
Monocytes Absolute: 0.7 10*3/uL (ref 0.1–1.0)
Monocytes Relative: 6 %
Neutro Abs: 6.5 10*3/uL (ref 1.7–7.7)
Neutrophils Relative %: 59 %

## 2017-11-05 LAB — COMPREHENSIVE METABOLIC PANEL
ALT: 19 U/L (ref 0–44)
AST: 25 U/L (ref 15–41)
Albumin: 3.3 g/dL — ABNORMAL LOW (ref 3.5–5.0)
Alkaline Phosphatase: 66 U/L (ref 38–126)
Anion gap: 13 (ref 5–15)
BUN: 31 mg/dL — ABNORMAL HIGH (ref 8–23)
CO2: 23 mmol/L (ref 22–32)
Calcium: 8.9 mg/dL (ref 8.9–10.3)
Chloride: 104 mmol/L (ref 98–111)
Creatinine, Ser: 2 mg/dL — ABNORMAL HIGH (ref 0.44–1.00)
GFR calc Af Amer: 26 mL/min — ABNORMAL LOW (ref >60)
GFR calc non Af Amer: 22 mL/min — ABNORMAL LOW (ref >60)
Glucose, Bld: 175 mg/dL — ABNORMAL HIGH (ref 70–99)
Potassium: 3.3 mmol/L — ABNORMAL LOW (ref 3.5–5.1)
Sodium: 140 mmol/L (ref 135–145)
Total Bilirubin: 0.6 mg/dL (ref 0.3–1.2)
Total Protein: 7 g/dL (ref 6.5–8.1)

## 2017-11-05 LAB — CBC
HCT: 34.2 % — ABNORMAL LOW (ref 36.0–46.0)
Hemoglobin: 10.9 g/dL — ABNORMAL LOW (ref 12.0–15.0)
MCH: 27.9 pg (ref 26.0–34.0)
MCHC: 31.9 g/dL (ref 30.0–36.0)
MCV: 87.5 fL (ref 78.0–100.0)
Platelets: 245 10*3/uL (ref 150–400)
RBC: 3.91 MIL/uL (ref 3.87–5.11)
RDW: 14.2 % (ref 11.5–15.5)
WBC: 10.8 10*3/uL — ABNORMAL HIGH (ref 4.0–10.5)

## 2017-11-05 LAB — PROTIME-INR
INR: 0.99
Prothrombin Time: 13 seconds (ref 11.4–15.2)

## 2017-11-05 LAB — APTT: aPTT: 33 seconds (ref 24–36)

## 2017-11-05 LAB — I-STAT TROPONIN, ED: Troponin i, poc: 0 ng/mL (ref 0.00–0.08)

## 2017-11-05 MED ORDER — ACETAMINOPHEN 650 MG RE SUPP: 650.0000 mg | RECTAL | Status: DC | PRN

## 2017-11-05 MED ORDER — STROKE: EARLY STAGES OF RECOVERY BOOK
Freq: Once | Status: AC
  Administered 2017-11-05: 23:00:00
  Filled 2017-11-05 (×2): qty 1

## 2017-11-05 MED ORDER — ENOXAPARIN SODIUM 30 MG/0.3ML ~~LOC~~ SOLN
30.0000 mg | SUBCUTANEOUS | Status: DC
  Administered 2017-11-05 – 2017-11-06 (×2): 30 mg via SUBCUTANEOUS
  Filled 2017-11-05 (×2): qty 0.3

## 2017-11-05 MED ORDER — HYDRALAZINE HCL 20 MG/ML IJ SOLN
10.0000 mg | Freq: Once | INTRAMUSCULAR | Status: AC
  Administered 2017-11-05: 10 mg via INTRAVENOUS
  Filled 2017-11-05: qty 1

## 2017-11-05 MED ORDER — SODIUM CHLORIDE 0.9 % IV BOLUS
500.0000 mL | Freq: Once | INTRAVENOUS | Status: AC
  Administered 2017-11-05: 500 mL via INTRAVENOUS

## 2017-11-05 MED ORDER — ONDANSETRON HCL 4 MG/2ML IJ SOLN: 4.0000 mg | Freq: Four times a day (QID) | INTRAMUSCULAR | Status: DC | PRN

## 2017-11-05 MED ORDER — ACETAMINOPHEN 325 MG PO TABS: 650.0000 mg | ORAL_TABLET | Freq: Four times a day (QID) | ORAL | Status: DC | PRN

## 2017-11-05 MED ORDER — ACETAMINOPHEN 325 MG PO TABS: 650.0000 mg | ORAL_TABLET | ORAL | Status: DC | PRN

## 2017-11-05 MED ORDER — ONDANSETRON HCL 4 MG PO TABS: 4.0000 mg | ORAL_TABLET | Freq: Four times a day (QID) | ORAL | Status: DC | PRN

## 2017-11-05 MED ORDER — SODIUM CHLORIDE 0.9 % IV SOLN
100.0000 mL/h | INTRAVENOUS | Status: DC
  Administered 2017-11-05 (×2): 100 mL/h via INTRAVENOUS

## 2017-11-05 MED ORDER — ENOXAPARIN SODIUM 40 MG/0.4ML ~~LOC~~ SOLN: 40.0000 mg | SUBCUTANEOUS | Status: DC

## 2017-11-05 MED ORDER — HYDRALAZINE HCL 20 MG/ML IJ SOLN: 10.0000 mg | INTRAMUSCULAR | Status: DC | PRN

## 2017-11-05 MED ORDER — HYDRALAZINE HCL 20 MG/ML IJ SOLN
5.0000 mg | INTRAMUSCULAR | Status: DC | PRN
  Administered 2017-11-06: 5 mg via INTRAVENOUS
  Filled 2017-11-05: qty 1

## 2017-11-05 MED ORDER — ACETAMINOPHEN 650 MG RE SUPP: 650.0000 mg | Freq: Four times a day (QID) | RECTAL | Status: DC | PRN

## 2017-11-05 MED ORDER — ACETAMINOPHEN 160 MG/5ML PO SOLN: 650.0000 mg | ORAL | Status: DC | PRN

## 2017-11-05 NOTE — ED Provider Notes (Signed)
[facility] EMERGENCY DEPARTMENT Provider Note   CSN: [id number] Arrival date & time: 11/05/17  1420     History   Chief Complaint Chief Complaint  Patient presents with  . Blurred Vision    HPI Chelsea Meza is a 82 y.o. female.  HPI Presents with concern of right eye blurry vision. Onset was about 4.5 hours prior to ED arrival, and after initially being severe, with loss of vision, described as a waiting on sensation of vision from the right eye. No concurrent left eye vision changes, nor headache, weakness in any extremity. Symptoms improved prior to arrival, and she notes that she has some decreased capacity to perceive images in the right, but has near complete resolution of her symptoms. She has no history of stroke, does have a history of hypertension, and today was preparing for outpatient visit when she noticed her changes. No recent medication change, diet change, activity change.  Past Medical History:  Diagnosis Date  . Diabetes mellitus without complication ([place])   . Hypertension     Patient Active Problem List   Diagnosis Date Noted  . TIA (transient ischemic attack) 11/05/2017  . Hyponatremia 03/20/2015  . AKI (acute kidney injury) ([place]) 03/20/2015  . CKD (chronic kidney disease) stage 3, GFR 30-59 ml/min ([name]) 03/20/2015  . Hyperglycemia 03/20/2015  . HLD (hyperlipidemia) 03/20/2015  . Diabetes mellitus due to underlying condition with diabetic nephropathy ([place]) 03/20/2015  . Diabetic hyperosmolar non-ketotic state ([place]) 07/14/2012  . Lactic acidosis 07/14/2012  . DKA, type 2 ([place]) 07/13/2012  . Acute renal failure ([place]) 07/13/2012  . PSVT (paroxysmal supraventricular tachycardia) ([place]) 07/13/2012  . Weakness of both legs 07/13/2012  . Paresthesia of left leg 07/13/2012  . Cough 07/13/2012  . Essential hypertension, benign 07/13/2012    Past Surgical History:  Procedure Laterality Date  . CHOLECYSTECTOMY       OB History   None       Home Medications    Prior to Admission medications   Medication Sig Start Date End Date Taking? Authorizing Provider  aspirin EC 81 MG tablet Take 81-325 mg by mouth daily.    Yes [provider]  atorvastatin (LIPITOR) 40 MG tablet Take 1 tablet (40 mg total) by mouth daily. 03/23/15  Yes Kathie Dike, MD  glimepiride (AMARYL) 4 MG tablet Take 2 tablets (8 mg total) by mouth daily with breakfast. Patient taking differently: Take 6 mg by mouth daily with breakfast.  03/23/15  Yes Kathie Dike, MD  hydrALAZINE (APRESOLINE) 50 MG tablet Take 1 tablet (50 mg total) by mouth 3 (three) times daily. Patient taking differently: Take 100 mg by mouth 3 (three) times daily.  03/23/15  Yes Kathie Dike, MD  sitaGLIPtin-metformin (JANUMET) 50-1000 MG tablet Take 1 tablet by mouth 2 (two) times daily with a meal. Patient taking differently: Take 1 tablet by mouth daily.  03/23/15  Yes Kathie Dike, MD  verapamil (CALAN) 80 MG tablet Take 1 tablet (80 mg total) by mouth 3 (three) times daily. 03/23/15  Yes Kathie Dike, MD    Family History History reviewed. No pertinent family history.  Social History Social History   Tobacco Use  . Smoking status: Never Smoker  . Smokeless tobacco: Never Used  Substance Use Topics  . Alcohol use: No  . Drug use: No     Allergies   Patient has no known allergies.   Review of Systems Review of Systems  Constitutional:       Per HPI,  otherwise negative  HENT:       Per HPI, otherwise negative  Eyes: Positive for visual disturbance.  Respiratory:       Per HPI, otherwise negative  Cardiovascular:       Per HPI, otherwise negative  Gastrointestinal: Negative for vomiting.  Endocrine:       Negative aside from HPI  Genitourinary:       Neg aside from HPI   Musculoskeletal:       Per HPI, otherwise negative  Skin: Negative.   Neurological: Negative for syncope.     Physical Exam Updated Vital Signs BP (!)  205/106   Pulse 80   Temp 98.1 F (36.7 C)   Resp (!) 25   Ht 5\' 8"  (1.727 m)   Wt 98.4 kg (217 lb)   SpO2 97%   BMI 32.99 kg/m   Physical Exam  Constitutional: She is oriented to person, place, and time. She appears well-developed and well-nourished. No distress.  HENT:  Head: Normocephalic and atraumatic.  Eyes: Conjunctivae, EOM and lids are normal. Right conjunctiva is not injected. Right conjunctiva has no hemorrhage. Left conjunctiva is not injected. Left conjunctiva has no hemorrhage. Right eye exhibits normal extraocular motion. Left eye exhibits normal extraocular motion. Right pupil is reactive. Left pupil is reactive.  Patient is appropriate perception of numbers in all 4 visual quadrants, with both eyes, tracks appropriately, has equal pupillary response.  Cardiovascular: Normal rate and regular rhythm.  Pulmonary/Chest: Effort normal and breath sounds normal. No stridor. No respiratory distress.  Abdominal: She exhibits no distension.  Musculoskeletal: She exhibits no edema.  Neurological: She is alert and oriented to person, place, and time. She displays atrophy. She displays no tremor. No cranial nerve deficit. She exhibits normal muscle tone. She displays no seizure activity. Coordination normal.  Skin: Skin is warm and dry.  Psychiatric: She has a normal mood and affect.  Nursing note and vitals reviewed.    ED Treatments / Results  Labs (all labs ordered are listed, but only abnormal results are displayed) Labs Reviewed  CBC - Abnormal; Notable for the following components:      Result Value   WBC 10.8 (*)    Hemoglobin 10.9 (*)    HCT 34.2 (*)    All other components within normal limits  COMPREHENSIVE METABOLIC PANEL - Abnormal; Notable for the following components:   Potassium 3.3 (*)    Glucose, Bld 175 (*)    BUN 31 (*)    Creatinine, Ser 2.00 (*)    Albumin 3.3 (*)    GFR calc non Af Amer 22 (*)    GFR calc Af Amer 26 (*)    All other components  within normal limits  PROTIME-INR  APTT  DIFFERENTIAL  RAPID URINE DRUG SCREEN, HOSP PERFORMED  URINALYSIS, ROUTINE W REFLEX MICROSCOPIC  I-STAT TROPONIN, ED    EKG #1 - on arrival EKG Interpretation  Date/Time:  Monday November 05 2017 14:36:32 EDT Ventricular Rate:  146 PR Interval:    QRS Duration: 111 QT Interval:  348 QTC Calculation: 543 R Axis:   1 Text Interpretation:  Supraventricular tachycardia Left ventricular hypertrophy Abnormal ekg Confirmed by Carmin Muskrat [phone]) on 11/05/2017 2:52:26 PM   ECG #2 after valsalva   EKG Interpretation  Date/Time:  Monday November 05 2017 14:40:01 EDT Ventricular Rate:  86 PR Interval:    QRS Duration: 92 QT Interval:  343 QTC Calculation: 411 R Axis:   3 Text Interpretation:  Sinus  rhythm Multiple ventricular premature complexes Prolonged PR interval Left ventricular hypertrophy Anterior Q waves, possibly due to LVH Abnormal ekg Confirmed by Carmin Muskrat [phone]) on 11/05/2017 2:55:24 PM        Radiology Ct Head Wo Contrast  Result Date: 11/05/2017 CLINICAL DATA:  Altered level of consciousness since 1000 hours this morning, blurred vision, diabetes mellitus, hypertension, renal dysfunction, fell out of bed 2 weeks ago EXAM: CT HEAD WITHOUT CONTRAST TECHNIQUE: Contiguous axial images were obtained from the base of the skull through the vertex without intravenous contrast. Sagittal and coronal MPR images reconstructed from axial data set. COMPARISON:  07/13/2012 FINDINGS: Brain: Generalized atrophy. Normal ventricular morphology. No midline shift or mass effect. Minimal small vessel chronic ischemic changes of deep cerebral white matter. No intracranial hemorrhage, mass lesion, or evidence of acute infarction. No extra-axial fluid collections. Vascular: Minimal atherosclerotic calcification of internal carotid arteries at skull base Skull: Intact Sinuses/Orbits: Visualized paranasal sinuses and mastoid air cells clear. Other: N/A  IMPRESSION: Atrophy with minimal small vessel chronic ischemic changes of deep cerebral white matter. No acute intracranial abnormalities. Electronically Signed   By: Lavonia Dana M.D.   On: 11/05/2017 16:18   Mr Jodene Nam Head Wo Contrast  Result Date: 11/05/2017 CLINICAL DATA:  82 year old female with blurred vision since 1000 hours today. EXAM: MRA HEAD WITHOUT CONTRAST TECHNIQUE: Angiographic images of the [place] were obtained using MRA technique without intravenous contrast. COMPARISON:  Neck MRA without contrast reported separately today. FINDINGS: Antegrade flow in the posterior circulation with tortuous distal vertebral arteries and vertebrobasilar junction. The left vertebral is mildly dominant B on the PICA origins. Both PICA origins are patent. Patent basilar artery without stenosis. Normal SCA and PCA origins. Both posterior communicating arteries are present. Bilateral PCA branches are patent. There is a mild to moderate short segment irregularity and stenosis of the left PCA P1/P2 junction (series 109, image 8). Antegrade flow in both ICA siphons. Siphon irregularity but no siphon stenosis. Ophthalmic and posterior communicating artery origins are normal. Patent carotid termini. Normal MCA and ACA origins. The left A1 segment is dominant. Anterior communicating artery and visible ACA branches are within normal limits. Bilateral MCA M1 segments and MCA bifurcations are patent without stenosis. Visible bilateral MCA branches are within normal limits. IMPRESSION: 1. Negative for intracranial large vessel occlusion, but positive for a mild to moderate stenosis of the Left PCA at the P1/P2 segment junction. Query homonymous right visual field symptoms. 2. Mild for age intracranial atherosclerosis otherwise. Intracranial artery tortuosity. Electronically Signed   By: Genevie Ann M.D.   On: 11/05/2017 18:27   Mr Jodene Nam Neck Wo Contrast  Result Date: 11/05/2017 CLINICAL DATA:  82 year old female with blurred  vision since 1000 hours today. EXAM: MRA NECK WITHOUT CONTRAST TECHNIQUE: Angiographic images of the neck were obtained using MRA technique without intravenous contrast. Carotid stenosis measurements (when applicable) are obtained utilizing NASCET criteria, using the distal internal carotid diameter as the denominator. COMPARISON:  Head CT without contrast 1556 hours today. FINDINGS: Noncontrast time-of-flight neck MRA imaging reveals a bovine type aortic arch configuration with tortuous proximal great vessels. There is antegrade flow in both cervical carotid and vertebral arteries. The vertebral arteries are tortuous and codominant. No cervical vertebral artery stenosis identified. No common carotid artery stenosis is evident. Both carotid bifurcations are tortuous and widely patent. Tortuous bilateral cervical ICAs, more so the right. Both ICAs have a kinked appearance due to tortuosity, but otherwise no stenosis. IMPRESSION: Tortuous great vessels,  cervical carotid and vertebral arteries but no arterial stenosis in the neck. Electronically Signed   By: Genevie Ann M.D.   On: 11/05/2017 18:21    Procedures Procedures (including critical care time)  Medications Ordered in ED Medications  sodium chloride 0.9 % bolus 500 mL (0 mLs Intravenous Stopped 11/05/17 1621)    Followed by  0.9 %  sodium chloride infusion (100 mL/hr Intravenous New Bag/Given 11/05/17 1636)  hydrALAZINE (APRESOLINE) injection 10 mg (has no administration in time range)     Initial Impression / Assessment and Plan / ED Course  I have reviewed the triage vital signs and the nursing notes.  Pertinent labs & imaging results that were available during my care of the patient were reviewed by me and considered in my medical decision making (see chart for details).    Vision with concern for TIA versus stroke discussed her case with the neurology colleagues to ensure appropriate imaging modality. She was 4+ hours out from onset of  illness, out of window for TPA, was not designated code stroke. In addition, her visual acuity was improving, and on nursing exam visual acuity was symmetric bilaterally, with 20/40 vision using both eyes.  Update:, Patient in similar condition, no recurrence of her visual deficit.  Patient's initial labs notable for renal dysfunction, she is not a candidate for contrast, and orders are changed for MRA without contrast to evaluate vasculature of head and neck. I discussed this with our neurology colleagues to ensure appropriate imaging.  Results notable for mild to moderate stenosis of PCA.  8:03 PM At the patient's case with our neurology colleagues again, with episodes of tachyarrhythmia, some suspicion for A. fib, as well as with her episode of vision change, now resolved, the patient will require admission for further evaluation and management, and consideration of additional antiplatelet/anticoagulant medication. Patient will be admitted to the hospitalist service with neurology consultation tomorrow morning. Patient is now hypertensive, but has not taken her evening meds. Patient provided IV hydralazine. She is otherwise in no distress, states that she feels better. Family aware of admission.  Final Clinical Impressions(s) / ED Diagnoses  Vision loss Hypertensive urgency  CRITICAL CARE Performed by: Carmin Muskrat Total critical care time: 35 minutes Critical care time was exclusive of separately billable procedures and treating other patients. Critical care was necessary to treat or prevent imminent or life-threatening deterioration. Critical care was time spent personally by me on the following activities: development of treatment plan with patient and/or surrogate as well as nursing, discussions with consultants, evaluation of patient's response to treatment, examination of patient, obtaining history from patient or surrogate, ordering and performing treatments and interventions,  ordering and review of laboratory studies, ordering and review of radiographic studies, pulse oximetry and re-evaluation of patient's condition.    Carmin Muskrat, MD 11/05/17 2006

## 2017-11-05 NOTE — ED Notes (Signed)
Patient ambulated to bathroom with 1 assist. Gait steady.

## 2017-11-05 NOTE — ED Triage Notes (Signed)
Patient brought in by EMS for complaint of blurry vision starting at 1000 this morning. States "I felt good when I woke up this morning."  Per EMS, CBG was 182.

## 2017-11-05 NOTE — H&P (Signed)
History and Physical    Chelsea Meza:[id number] DOB: [birth year] DOA: 11/05/2017  PCP: Iona Beard, MD   Patient coming from: Home.  I have personally briefly reviewed patient's old medical records in [place]  Chief Complaint: Blurred vision.  HPI: Chelsea Meza is a 82 y.o. female with medical history significant of chronic kidney disease, type 2 diabetes, hyperlipidemia, hypertension who is coming to the emergency department due to blurred vision around 1000 in the morning, which lasted for about an hour.  She she had a mild frontal headache, but denies focal weakness or numbness, dizziness, slurred speech or language comprehension difficulty.  There was no gait instability.  However, she complained of having mild nausea in the morning, which was follow later with 3 loose stools BMs before noon time.  She denies abdominal pain, emesis, further loose stools since then, melena or hematochezia.  No fever, chills, sore throat, dyspnea, wheezing, chest pain, palpitations, PND, orthopnea or recent pitting edema of the lower extremities.  Denies dysuria, frequency or hematuria.  No heat or cold intolerance.  No polyuria, polydipsia or polyphagia.  Denies of skin pruritus or rashes.  ED Course: Initial vital signs temperature 98.1 F, pulse 92, respirations 18, blood pressure 138/89 mmHg and O2 sat 97% on room air.  She received a 500 mL NS bolus and 10 mg of hydralazine IVP x1.  Her white count was 10.8 with a normal differential, hemoglobin 10.9 g/dL and platelets 245.  PT 13.0, INR 0.99 and APTT 33 seconds.  Troponin level was normal.Sodium 140, potassium 3.3, chloride 104, CO2 23 mmol/L.  BUN 31, creatinine 2.0 and glucose 175 mg/dL.  LFTs were unremarkable, except for mildly decreased albumin at 3.3 g/dL.  Imaging: Her chest radiograph shows stable cardiomegaly.  Mild bronchitic changes without consolidation.  There was a aortic atherosclerosis. CT head did not show any acute  intracranial pathology.  MR MRI of brain show no intracranial large vessel occlusion, but was positive for mild to moderate stenosis of the left PCA at the P1/P2 segment junction.  There was mild for age intracranial atherosclerosis otherwise.  Intracranial artery tortuosity.   Please see images and full radiology reports for further detail.  Review of Systems: As per HPI otherwise 10 point review of systems negative.    Past Medical History:  Diagnosis Date  . CKD (chronic kidney disease) stage 3, GFR 30-59 ml/min ([name])   . Diabetes mellitus without complication ([place])   . Hyperlipidemia   . Hypertension     Past Surgical History:  Procedure Laterality Date  . CHOLECYSTECTOMY       reports that she has never smoked. She has never used smokeless tobacco. She reports that she does not drink alcohol or use drugs.  No Known Allergies   Family History  Problem Relation Age of Onset  . Stroke Mother   . Stroke Maternal Grandmother    Prior to Admission medications   Medication Sig Start Date End Date Taking? Authorizing Provider  aspirin EC 81 MG tablet Take 81-325 mg by mouth daily.    Yes [provider]  atorvastatin (LIPITOR) 40 MG tablet Take 1 tablet (40 mg total) by mouth daily. 03/23/15  Yes Kathie Dike, MD  glimepiride (AMARYL) 4 MG tablet Take 2 tablets (8 mg total) by mouth daily with breakfast. Patient taking differently: Take 6 mg by mouth daily with breakfast.  03/23/15  Yes Kathie Dike, MD  hydrALAZINE (APRESOLINE) 50 MG tablet Take 1 tablet (  50 mg total) by mouth 3 (three) times daily. Patient taking differently: Take 100 mg by mouth 3 (three) times daily.  03/23/15  Yes Kathie Dike, MD  sitaGLIPtin-metformin (JANUMET) 50-1000 MG tablet Take 1 tablet by mouth 2 (two) times daily with a meal. Patient taking differently: Take 1 tablet by mouth daily.  03/23/15  Yes Kathie Dike, MD  verapamil (CALAN) 80 MG tablet Take 1 tablet (80 mg total) by  mouth 3 (three) times daily. 03/23/15  Yes Kathie Dike, MD    Physical Exam: Vitals:   11/05/17 2030 11/05/17 2100 11/05/17 2115 11/05/17 2211  BP: (!) 134/116 122/84  (!) 207/101  Pulse: 78 (!) 140 78 77  Resp: (!) 26 19 (!) 21 18  Temp:    98.4 F (36.9 C)  TempSrc:    Oral  SpO2: 97% 97% 98% 97%  Weight:      Height:        Constitutional: NAD, calm, comfortable Eyes: PERRL, EOMI, patient feels do not seem to have any deficits, lids and conjunctivae normal ENMT: Mucous membranes are moist. Posterior pharynx clear of any exudate or lesions. Neck: normal, supple, no masses, no thyromegaly Respiratory: clear to auscultation bilaterally, no wheezing, no crackles. Normal respiratory effort. No accessory muscle use.  Cardiovascular: Regular rate and rhythm, multiple extrasystoles, no murmurs / rubs / gallops. No extremity edema. 2+ pedal pulses. No carotid bruits.  Abdomen: no tenderness, no masses palpated. No hepatosplenomegaly. Bowel sounds positive.  Musculoskeletal: no clubbing / cyanosis.  Good ROM, no contractures. Normal muscle tone.  Skin: no rashes, lesions, ulcers on limited dermatological examination. Neurologic: CN 2-12 grossly intact. Sensation intact, DTR normal. Strength 5/5 in all 4.  Psychiatric: Normal judgment and insight. Alert and oriented x 4. Normal mood.    Labs on Admission: I have personally reviewed following labs and imaging studies  CBC: Recent Labs  Lab 11/05/17 1447  WBC 10.8*  NEUTROABS 6.5  HGB 10.9*  HCT 34.2*  MCV 87.5  PLT 268   Basic Metabolic Panel: Recent Labs  Lab 11/05/17 1447  NA 140  K 3.3*  CL 104  CO2 23  GLUCOSE 175*  BUN 31*  CREATININE 2.00*  CALCIUM 8.9   GFR: Estimated Creatinine Clearance: 27.5 mL/min (A) (by C-G formula based on SCr of 2 mg/dL (H)). Liver Function Tests: Recent Labs  Lab 11/05/17 1447  AST 25  ALT 19  ALKPHOS 66  BILITOT 0.6  PROT 7.0  ALBUMIN 3.3*   No results for input(s):  LIPASE, AMYLASE in the last 168 hours. No results for input(s): AMMONIA in the last 168 hours. Coagulation Profile: Recent Labs  Lab 11/05/17 1447  INR 0.99   Cardiac Enzymes: No results for input(s): CKTOTAL, CKMB, CKMBINDEX, TROPONINI in the last 168 hours. BNP (last 3 results) No results for input(s): PROBNP in the last 8760 hours. HbA1C: No results for input(s): HGBA1C in the last 72 hours. CBG: No results for input(s): GLUCAP in the last 168 hours. Lipid Profile: No results for input(s): CHOL, HDL, LDLCALC, TRIG, CHOLHDL, LDLDIRECT in the last 72 hours. Thyroid Function Tests: No results for input(s): TSH, T4TOTAL, FREET4, T3FREE, THYROIDAB in the last 72 hours. Anemia Panel: No results for input(s): VITAMINB12, FOLATE, FERRITIN, TIBC, IRON, RETICCTPCT in the last 72 hours. Urine analysis:    Component Value Date/Time   COLORURINE YELLOW 03/20/2015 1246   APPEARANCEUR HAZY (A) 03/20/2015 1246   LABSPEC 1.010 03/20/2015 1246   PHURINE 5.5 03/20/2015 1246  GLUCOSEU >1000 (A) 03/20/2015 1246   HGBUR TRACE (A) 03/20/2015 1246   BILIRUBINUR NEGATIVE 03/20/2015 1246   KETONESUR NEGATIVE 03/20/2015 1246   PROTEINUR NEGATIVE 03/20/2015 1246   UROBILINOGEN 0.2 07/13/2012 1236   NITRITE NEGATIVE 03/20/2015 1246   LEUKOCYTESUR NEGATIVE 03/20/2015 1246    Radiological Exams on Admission: Dg Chest 2 View  Result Date: 11/05/2017 CLINICAL DATA:  TIA.  History of hypertension, diabetes. EXAM: CHEST - 2 VIEW COMPARISON:  Chest radiograph March 20, 2015 FINDINGS: Cardiac silhouette is moderately enlarged unchanged. Tortuous aorta associated with chronic hypertension. Calcified aortic arch. Mild bronchitic changes without pleural effusion or focal consolidation. No pneumothorax. Osteopenia. Wires projecting in the mouth. Soft tissue planes are non suspicious. Surgical clips in the abdomen. IMPRESSION: Stable cardiomegaly. Mild bronchitic changes without focal consolidation. Aortic  Atherosclerosis (ICD10-I70.0). Electronically Signed   By: Elon Alas M.D.   On: 11/05/2017 20:39   Ct Head Wo Contrast  Result Date: 11/05/2017 CLINICAL DATA:  Altered level of consciousness since 1000 hours this morning, blurred vision, diabetes mellitus, hypertension, renal dysfunction, fell out of bed 2 weeks ago EXAM: CT HEAD WITHOUT CONTRAST TECHNIQUE: Contiguous axial images were obtained from the base of the skull through the vertex without intravenous contrast. Sagittal and coronal MPR images reconstructed from axial data set. COMPARISON:  07/13/2012 FINDINGS: Brain: Generalized atrophy. Normal ventricular morphology. No midline shift or mass effect. Minimal small vessel chronic ischemic changes of deep cerebral white matter. No intracranial hemorrhage, mass lesion, or evidence of acute infarction. No extra-axial fluid collections. Vascular: Minimal atherosclerotic calcification of internal carotid arteries at skull base Skull: Intact Sinuses/Orbits: Visualized paranasal sinuses and mastoid air cells clear. Other: N/A IMPRESSION: Atrophy with minimal small vessel chronic ischemic changes of deep cerebral white matter. No acute intracranial abnormalities. Electronically Signed   By: Lavonia Dana M.D.   On: 11/05/2017 16:18   Mr Jodene Nam Head Wo Contrast  Result Date: 11/05/2017 CLINICAL DATA:  82 year old female with blurred vision since 1000 hours today. EXAM: MRA HEAD WITHOUT CONTRAST TECHNIQUE: Angiographic images of the [place] were obtained using MRA technique without intravenous contrast. COMPARISON:  Neck MRA without contrast reported separately today. FINDINGS: Antegrade flow in the posterior circulation with tortuous distal vertebral arteries and vertebrobasilar junction. The left vertebral is mildly dominant B on the PICA origins. Both PICA origins are patent. Patent basilar artery without stenosis. Normal SCA and PCA origins. Both posterior communicating arteries are present.  Bilateral PCA branches are patent. There is a mild to moderate short segment irregularity and stenosis of the left PCA P1/P2 junction (series 109, image 8). Antegrade flow in both ICA siphons. Siphon irregularity but no siphon stenosis. Ophthalmic and posterior communicating artery origins are normal. Patent carotid termini. Normal MCA and ACA origins. The left A1 segment is dominant. Anterior communicating artery and visible ACA branches are within normal limits. Bilateral MCA M1 segments and MCA bifurcations are patent without stenosis. Visible bilateral MCA branches are within normal limits. IMPRESSION: 1. Negative for intracranial large vessel occlusion, but positive for a mild to moderate stenosis of the Left PCA at the P1/P2 segment junction. Query homonymous right visual field symptoms. 2. Mild for age intracranial atherosclerosis otherwise. Intracranial artery tortuosity. Electronically Signed   By: Genevie Ann M.D.   On: 11/05/2017 18:27   Mr Jodene Nam Neck Wo Contrast  Result Date: 11/05/2017 CLINICAL DATA:  82 year old female with blurred vision since 1000 hours today. EXAM: MRA NECK WITHOUT CONTRAST TECHNIQUE: Angiographic images of the  neck were obtained using MRA technique without intravenous contrast. Carotid stenosis measurements (when applicable) are obtained utilizing NASCET criteria, using the distal internal carotid diameter as the denominator. COMPARISON:  Head CT without contrast 1556 hours today. FINDINGS: Noncontrast time-of-flight neck MRA imaging reveals a bovine type aortic arch configuration with tortuous proximal great vessels. There is antegrade flow in both cervical carotid and vertebral arteries. The vertebral arteries are tortuous and codominant. No cervical vertebral artery stenosis identified. No common carotid artery stenosis is evident. Both carotid bifurcations are tortuous and widely patent. Tortuous bilateral cervical ICAs, more so the right. Both ICAs have a kinked appearance due to  tortuosity, but otherwise no stenosis. IMPRESSION: Tortuous great vessels, cervical carotid and vertebral arteries but no arterial stenosis in the neck. Electronically Signed   By: Genevie Ann M.D.   On: 11/05/2017 18:21    EKG: Independently reviewed.  Vent. rate 86 BPM PR interval * ms QRS duration 92 ms QT/QTc 343/411 ms P-R-T axes 39 3 16 Sinus rhythm Multiple ventricular premature complexes Prolonged PR interval Left ventricular hypertrophy Anterior Q waves, possibly due to LVH  Assessment/Plan Principal Problem:   TIA (transient ischemic attack) Observation/telemetry. Frequent neuro checks. Check hemoglobin A1c and fasting lipids. Check carotid Doppler and echocardiogram. Continue aspirin. Consult neurology.  Active Problems:   Diabetes mellitus due to underlying condition with diabetic nephropathy ([name]) Continue Amaryl 6 mg p.o. daily. Continue Janumet 50-1000 mg 1 tablet p.o. daily. CBG monitoring with regular insulin sliding scale. Hemoglobin A1c in the morning.    PSVT (paroxysmal supraventricular tachycardia) ([name]) Keep electrolytes optimized. Continue verapamil.    Essential hypertension, benign Allow permissive hypertension. Hold hydralazine. Continue verapamil 80 mg since the patient became tachycardic earlier.    AKI (acute kidney injury) ([place])   CKD (chronic kidney disease) stage 3, GFR 30-59 ml/min ([name]) Gentle IV hydration. Monitor intake and output. Follow-up renal function electrolytes.    HLD (hyperlipidemia) Has been off atorvastatin since last month. Resume atorvastatin 40 mg p.o. daily.    Hypokalemia Replacing. Follow-up potassium level.    DVT prophylaxis: SQ Lovenox. Code Status: Full code. Family Communication:  Disposition Plan: Observation admit for TIA work-up and neurology evaluation. Consults called: Routine neurology consult. Admission status: Observation/telemetry.   Reubin Milan MD [facility] Pager  [phone].  If 7PM-7AM, please contact night-coverage [email or web address] Password [email or web address]  11/05/2017, 10:20 PM

## 2017-11-05 NOTE — ED Notes (Signed)
Pt aware of need for urine specimen. 

## 2017-11-06 ENCOUNTER — Encounter (HOSPITAL_COMMUNITY): Payer: Self-pay | Admitting: Internal Medicine

## 2017-11-06 ENCOUNTER — Observation Stay (HOSPITAL_COMMUNITY): Payer: Medicare Other

## 2017-11-06 ENCOUNTER — Observation Stay (HOSPITAL_COMMUNITY)
Admit: 2017-11-06 | Discharge: 2017-11-06 | Disposition: A | Discharge status: Home / Self Care | Payer: Medicare Other | Attending: Neurology | Admitting: Neurology

## 2017-11-06 ENCOUNTER — Observation Stay (HOSPITAL_BASED_OUTPATIENT_CLINIC_OR_DEPARTMENT_OTHER): Payer: Medicare Other

## 2017-11-06 DIAGNOSIS — I6789 Other cerebrovascular disease: Secondary | ICD-10-CM

## 2017-11-06 DIAGNOSIS — R569 Unspecified convulsions: Secondary | ICD-10-CM | POA: Diagnosis not present

## 2017-11-06 DIAGNOSIS — N179 Acute kidney failure, unspecified: Secondary | ICD-10-CM

## 2017-11-06 DIAGNOSIS — H538 Other visual disturbances: Secondary | ICD-10-CM | POA: Diagnosis not present

## 2017-11-06 DIAGNOSIS — I10 Essential (primary) hypertension: Secondary | ICD-10-CM

## 2017-11-06 DIAGNOSIS — H547 Unspecified visual loss: Secondary | ICD-10-CM | POA: Diagnosis not present

## 2017-11-06 DIAGNOSIS — Z794 Long term (current) use of insulin: Secondary | ICD-10-CM

## 2017-11-06 DIAGNOSIS — E0821 Diabetes mellitus due to underlying condition with diabetic nephropathy: Secondary | ICD-10-CM | POA: Diagnosis not present

## 2017-11-06 DIAGNOSIS — E876 Hypokalemia: Secondary | ICD-10-CM

## 2017-11-06 DIAGNOSIS — I951 Orthostatic hypotension: Secondary | ICD-10-CM | POA: Diagnosis not present

## 2017-11-06 DIAGNOSIS — I471 Supraventricular tachycardia: Secondary | ICD-10-CM

## 2017-11-06 DIAGNOSIS — N183 Chronic kidney disease, stage 3 (moderate): Secondary | ICD-10-CM

## 2017-11-06 DIAGNOSIS — G459 Transient cerebral ischemic attack, unspecified: Secondary | ICD-10-CM | POA: Diagnosis not present

## 2017-11-06 LAB — CBC
HCT: 30.4 % — ABNORMAL LOW (ref 36.0–46.0)
Hemoglobin: 9.5 g/dL — ABNORMAL LOW (ref 12.0–15.0)
MCH: 27.5 pg (ref 26.0–34.0)
MCHC: 31.3 g/dL (ref 30.0–36.0)
MCV: 87.9 fL (ref 78.0–100.0)
Platelets: 213 10*3/uL (ref 150–400)
RBC: 3.46 MIL/uL — ABNORMAL LOW (ref 3.87–5.11)
RDW: 14.2 % (ref 11.5–15.5)
WBC: 7.2 10*3/uL (ref 4.0–10.5)

## 2017-11-06 LAB — URINALYSIS, ROUTINE W REFLEX MICROSCOPIC
Bilirubin Urine: NEGATIVE
Glucose, UA: NEGATIVE mg/dL
Hgb urine dipstick: NEGATIVE
Ketones, ur: NEGATIVE mg/dL
Leukocytes, UA: NEGATIVE
Nitrite: NEGATIVE
Protein, ur: 100 mg/dL — AB
Specific Gravity, Urine: 1.009 (ref 1.005–1.030)
pH: 6 (ref 5.0–8.0)

## 2017-11-06 LAB — LIPID PANEL
Cholesterol: 205 mg/dL — ABNORMAL HIGH (ref 0–200)
HDL: 53 mg/dL (ref >40)
LDL Cholesterol: 131 mg/dL — ABNORMAL HIGH (ref 0–99)
Total CHOL/HDL Ratio: 3.9 RATIO
Triglycerides: 103 mg/dL (ref <150)
VLDL: 21 mg/dL (ref 0–40)

## 2017-11-06 LAB — BASIC METABOLIC PANEL
Anion gap: 9 (ref 5–15)
BUN: 29 mg/dL — ABNORMAL HIGH (ref 8–23)
CO2: 26 mmol/L (ref 22–32)
Calcium: 8.5 mg/dL — ABNORMAL LOW (ref 8.9–10.3)
Chloride: 108 mmol/L (ref 98–111)
Creatinine, Ser: 1.8 mg/dL — ABNORMAL HIGH (ref 0.44–1.00)
GFR calc Af Amer: 29 mL/min — ABNORMAL LOW (ref >60)
GFR calc non Af Amer: 25 mL/min — ABNORMAL LOW (ref >60)
Glucose, Bld: 193 mg/dL — ABNORMAL HIGH (ref 70–99)
Potassium: 3.3 mmol/L — ABNORMAL LOW (ref 3.5–5.1)
Sodium: 143 mmol/L (ref 135–145)

## 2017-11-06 LAB — GLUCOSE, CAPILLARY
Glucose-Capillary: 92 mg/dL (ref 70–99)
Glucose-Capillary: 240 mg/dL — ABNORMAL HIGH (ref 70–99)
Glucose-Capillary: 97 mg/dL (ref 70–99)
Glucose-Capillary: 157 mg/dL — ABNORMAL HIGH (ref 70–99)

## 2017-11-06 LAB — MAGNESIUM: Magnesium: 1.2 mg/dL — ABNORMAL LOW (ref 1.7–2.4)

## 2017-11-06 LAB — T4, FREE: Free T4: 0.98 ng/dL (ref 0.82–1.77)

## 2017-11-06 LAB — ECHOCARDIOGRAM COMPLETE
Height: 68 in
Weight: 3160 oz

## 2017-11-06 LAB — RAPID URINE DRUG SCREEN, HOSP PERFORMED
Amphetamines: NOT DETECTED
Benzodiazepines: NOT DETECTED
Cocaine: NOT DETECTED
Opiates: NOT DETECTED
Tetrahydrocannabinol: NOT DETECTED

## 2017-11-06 LAB — PHOSPHORUS: Phosphorus: 2.9 mg/dL (ref 2.5–4.6)

## 2017-11-06 LAB — TSH: TSH: 3.049 u[IU]/mL (ref 0.350–4.500)

## 2017-11-06 MED ORDER — ASPIRIN EC 81 MG PO TBEC
81.0000 mg | DELAYED_RELEASE_TABLET | Freq: Every day | ORAL | Status: DC
  Administered 2017-11-06 – 2017-11-07 (×2): 81 mg via ORAL
  Filled 2017-11-06 (×2): qty 1

## 2017-11-06 MED ORDER — POTASSIUM CHLORIDE IN NACL 40-0.9 MEQ/L-% IV SOLN
INTRAVENOUS | Status: AC
  Administered 2017-11-06: 88 mL/h via INTRAVENOUS

## 2017-11-06 MED ORDER — INSULIN ASPART 100 UNIT/ML ~~LOC~~ SOLN
0.0000 [IU] | Freq: Three times a day (TID) | SUBCUTANEOUS | Status: DC
  Administered 2017-11-06: 3 [IU] via SUBCUTANEOUS
  Administered 2017-11-07: 1 [IU] via SUBCUTANEOUS
  Administered 2017-11-07: 3 [IU] via SUBCUTANEOUS

## 2017-11-06 MED ORDER — SITAGLIPTIN PHOS-METFORMIN HCL 50-1000 MG PO TABS: 1.0000 | ORAL_TABLET | Freq: Every day | ORAL | Status: DC

## 2017-11-06 MED ORDER — ATORVASTATIN CALCIUM 40 MG PO TABS
40.0000 mg | ORAL_TABLET | Freq: Every day | ORAL | Status: DC
  Administered 2017-11-06: 40 mg via ORAL
  Filled 2017-11-06: qty 1

## 2017-11-06 MED ORDER — ASPIRIN EC 81 MG PO TBEC: 81.0000 mg | DELAYED_RELEASE_TABLET | Freq: Every day | ORAL | Status: DC

## 2017-11-06 MED ORDER — HYDRALAZINE HCL 25 MG PO TABS
50.0000 mg | ORAL_TABLET | Freq: Three times a day (TID) | ORAL | Status: DC
  Administered 2017-11-06 – 2017-11-07 (×3): 50 mg via ORAL
  Filled 2017-11-06 (×3): qty 2

## 2017-11-06 MED ORDER — GLIMEPIRIDE 2 MG PO TABS: 6.0000 mg | ORAL_TABLET | Freq: Every day | ORAL | Status: DC

## 2017-11-06 MED ORDER — LINAGLIPTIN 5 MG PO TABS: 5.0000 mg | ORAL_TABLET | Freq: Every day | ORAL | Status: DC

## 2017-11-06 MED ORDER — HYDRALAZINE HCL 20 MG/ML IJ SOLN
10.0000 mg | Freq: Once | INTRAMUSCULAR | Status: AC
  Administered 2017-11-06: 10 mg via INTRAVENOUS
  Filled 2017-11-06: qty 1

## 2017-11-06 MED ORDER — VERAPAMIL HCL 120 MG PO TABS: 120.0000 mg | ORAL_TABLET | Freq: Three times a day (TID) | ORAL | 1 refills | Status: DC

## 2017-11-06 MED ORDER — POTASSIUM CHLORIDE CRYS ER 20 MEQ PO TBCR
40.0000 meq | EXTENDED_RELEASE_TABLET | Freq: Once | ORAL | Status: AC
  Administered 2017-11-06: 40 meq via ORAL
  Filled 2017-11-06: qty 2

## 2017-11-06 MED ORDER — VERAPAMIL HCL 80 MG PO TABS: 80.0000 mg | ORAL_TABLET | Freq: Three times a day (TID) | ORAL | Status: DC

## 2017-11-06 MED ORDER — ATORVASTATIN CALCIUM 80 MG PO TABS: 80.0000 mg | ORAL_TABLET | Freq: Every day | ORAL | 1 refills | Status: DC

## 2017-11-06 MED ORDER — VERAPAMIL HCL 120 MG PO TABS
120.0000 mg | ORAL_TABLET | Freq: Three times a day (TID) | ORAL | Status: DC
  Administered 2017-11-06 – 2017-11-07 (×4): 120 mg via ORAL
  Filled 2017-11-06 (×4): qty 1

## 2017-11-06 MED ORDER — MAGNESIUM SULFATE 2 GM/50ML IV SOLN
2.0000 g | Freq: Once | INTRAVENOUS | Status: AC
  Administered 2017-11-06: 2 g via INTRAVENOUS
  Filled 2017-11-06: qty 50

## 2017-11-06 MED ORDER — ATORVASTATIN CALCIUM 40 MG PO TABS: 80.0000 mg | ORAL_TABLET | Freq: Every day | ORAL | Status: DC

## 2017-11-06 MED ORDER — METFORMIN HCL 500 MG PO TABS: 1000.0000 mg | ORAL_TABLET | Freq: Every day | ORAL | Status: DC

## 2017-11-06 NOTE — Consult Note (Signed)
[facility] [place] A. Chelsea Laughter, MD     [email or web address]          Chelsea Meza is an 82 y.o. female.   ASSESSMENT/PLAN: 1.  Acute visual disturbance involving both eyes of unclear etiology.  It is unlikely that the patient has had a primary neurological event to explain Chelsea Meza symptoms.  Other possibilities includes primary ophthalmic problems such as glaucoma, hypertension and other metabolic issues.  It is possible psychosomatic disorders could be a possibility but this would have to be a diagnosis of exclusion.  Agree with the current work-up pending.  Follow-up MRI of the brain.  Agree with aspirin 81 mg.  I also suggest that she sees a ophthalmologist for further evaluation.      The patient is an 82 year old black female who was apparently waiting for relative to pick Chelsea Meza up.  She developed acute onset of complete whitening of Chelsea Meza vision bilaterally and involving both eyes.  The entire episode lasted for about 30 minutes.  There is some suggestion that the left side may have been more symptomatic.  She denies other associated symptoms such as focal numbness, weakness, dysarthria or dysphasia.  She denies chest pain or shortness of breath.  There is no loss of consciousness or alteration of consciousness.  She thinks she has returned to baseline.  There is no previous episodes of such events.  The review of systems otherwise negative.    GENERAL: This is a very pleasant female who is in no acute distress.  HEENT: Pupils are 4 mm and reactive.  She is status post bilateral cataract extraction and lens implantation.  ABDOMEN: soft  EXTREMITIES: No edema   BACK: Normal  SKIN: Normal by inspection.    MENTAL STATUS: Alert and oriented -including orientation to Chelsea Meza age and of the month. Speech, language and cognition are generally intact. Judgment and insight normal.   CRANIAL NERVES: Pupils are equal, round and reactive to light and accomodation; extra ocular  movements are full, there is no significant nystagmus; visual fields are full; upper and lower facial muscles are normal in strength and symmetric, there is no flattening of the nasolabial folds; tongue is midline; uvula is midline; shoulder elevation is normal.  MOTOR: Normal tone, bulk and strength; no pronator drift.  There is no drift of the upper extremities or the legs.  COORDINATION: Left finger to nose is normal, right finger to nose is normal, No rest tremor; no intention tremor; no postural tremor; no bradykinesia.  REFLEXES: Deep tendon reflexes are symmetrical and normal. Plantar reflexes are flexor bilaterally.   SENSATION: Normal to light touch, temperature, and pain.  No evidence of neglect.  NIH stroke scale 0.   Blood pressure (!) 192/109, pulse 73, temperature 98.7 F (37.1 C), temperature source Oral, resp. rate 18, height 5' 8" (1.727 m), weight 197 lb 8 oz (89.6 kg), SpO2 98 %.  Past Medical History:  Diagnosis Date  . CKD (chronic kidney disease) stage 3, GFR 30-59 ml/min ([name])   . Diabetes mellitus without complication ([place])   . Hyperlipidemia   . Hypertension     Past Surgical History:  Procedure Laterality Date  . CHOLECYSTECTOMY      Family History  Problem Relation Age of Onset  . Stroke Mother   . Stroke Maternal Grandmother     Social History:  reports that she has never smoked. She has never used smokeless tobacco. She reports that she does not drink alcohol or use drugs.  Allergies:  No Known Allergies  Medications: Prior to Admission medications   Medication Sig Start Date End Date Taking? Authorizing Provider  aspirin EC 81 MG tablet Take 81-325 mg by mouth daily.    Yes [provider]  atorvastatin (LIPITOR) 40 MG tablet Take 1 tablet (40 mg total) by mouth daily. 03/23/15  Yes Kathie Dike, MD  glimepiride (AMARYL) 4 MG tablet Take 2 tablets (8 mg total) by mouth daily with breakfast. Patient taking differently: Take 6 mg by  mouth daily with breakfast.  03/23/15  Yes Kathie Dike, MD  hydrALAZINE (APRESOLINE) 50 MG tablet Take 1 tablet (50 mg total) by mouth 3 (three) times daily. Patient taking differently: Take 100 mg by mouth 3 (three) times daily.  03/23/15  Yes Kathie Dike, MD  sitaGLIPtin-metformin (JANUMET) 50-1000 MG tablet Take 1 tablet by mouth 2 (two) times daily with a meal. Patient taking differently: Take 1 tablet by mouth daily.  03/23/15  Yes Kathie Dike, MD  verapamil (CALAN) 80 MG tablet Take 1 tablet (80 mg total) by mouth 3 (three) times daily. 03/23/15  Yes Kathie Dike, MD    Scheduled Meds: . aspirin EC  81 mg Oral Daily  . atorvastatin  40 mg Oral Daily  . enoxaparin (LOVENOX) injection  30 mg Subcutaneous Q24H  . glimepiride  6 mg Oral Q breakfast  . insulin aspart  0-9 Units Subcutaneous TID WC  . linagliptin  5 mg Oral Q breakfast   And  . metFORMIN  1,000 mg Oral Q breakfast  . potassium chloride  40 mEq Oral Once  . verapamil  80 mg Oral TID   Continuous Infusions: . sodium chloride Stopped (11/06/17 0222)  . 0.9 % NaCl with KCl 40 mEq / L 88 mL/hr (11/06/17 0222)  . magnesium sulfate 1 - 4 g bolus IVPB     PRN Meds:.acetaminophen **OR** acetaminophen (TYLENOL) oral liquid 160 mg/5 mL **OR** acetaminophen, hydrALAZINE, ondansetron **OR** ondansetron (ZOFRAN) IV     Results for orders placed or performed during the hospital encounter of 11/05/17 (from the past 48 hour(s))  Protime-INR     Status: None   Collection Time: 11/05/17  2:47 PM  Result Value Ref Range   Prothrombin Time 13.0 11.4 - 15.2 seconds   INR 0.99     Comment: Performed at [facility], [address] [place], [place] [address]  APTT     Status: None   Collection Time: 11/05/17  2:47 PM  Result Value Ref Range   aPTT 33 24 - 36 seconds    Comment: Performed at [facility], [address] [place], [place] [address]  CBC     Status: Abnormal   Collection Time: 11/05/17  2:47 PM  Result  Value Ref Range   WBC 10.8 (H) 4.0 - 10.5 K/uL   RBC 3.91 3.87 - 5.11 MIL/uL   Hemoglobin 10.9 (L) 12.0 - 15.0 g/dL   HCT 34.2 (L) 36.0 - 46.0 %   MCV 87.5 78.0 - 100.0 fL   MCH 27.9 26.0 - 34.0 pg   MCHC 31.9 30.0 - 36.0 g/dL   RDW 14.2 11.5 - 15.5 %   Platelets 245 150 - 400 K/uL    Comment: Performed at [facility], [address] [place], [place] [address]  Differential     Status: None   Collection Time: 11/05/17  2:47 PM  Result Value Ref Range   Neutrophils Relative % 59 %   Neutro Abs 6.5 1.7 - 7.7 K/uL  Lymphocytes Relative 31 %   Lymphs Abs 3.3 0.7 - 4.0 K/uL   Monocytes Relative 6 %   Monocytes Absolute 0.7 0.1 - 1.0 K/uL   Eosinophils Relative 3 %   Eosinophils Absolute 0.3 0.0 - 0.7 K/uL   Basophils Relative 1 %   Basophils Absolute 0.1 0.0 - 0.1 K/uL    Comment: Performed at [facility], [address] [place], [place] [address]  Comprehensive metabolic panel     Status: Abnormal   Collection Time: 11/05/17  2:47 PM  Result Value Ref Range   Sodium 140 135 - 145 mmol/L   Potassium 3.3 (L) 3.5 - 5.1 mmol/L   Chloride 104 98 - 111 mmol/L    Comment: Please note change in reference range.   CO2 23 22 - 32 mmol/L   Glucose, Bld 175 (H) 70 - 99 mg/dL    Comment: Please note change in reference range.   BUN 31 (H) 8 - 23 mg/dL    Comment: Please note change in reference range.   Creatinine, Ser 2.00 (H) 0.44 - 1.00 mg/dL   Calcium 8.9 8.9 - 10.3 mg/dL   Total Protein 7.0 6.5 - 8.1 g/dL   Albumin 3.3 (L) 3.5 - 5.0 g/dL   AST 25 15 - 41 U/L   ALT 19 0 - 44 U/L    Comment: Please note change in reference range.   Alkaline Phosphatase 66 38 - 126 U/L   Total Bilirubin 0.6 0.3 - 1.2 mg/dL   GFR calc non Af Amer 22 (L) >60 mL/min   GFR calc Af Amer 26 (L) >60 mL/min    Comment: (NOTE) The eGFR has been calculated using the CKD EPI equation. This calculation has not been validated in all clinical situations. eGFR's persistently <60 mL/min signify possible  Chronic Kidney Disease.    Anion gap 13 5 - 15    Comment: Performed at [facility], [address] [place], [place] [address]  I-stat troponin, ED     Status: None   Collection Time: 11/05/17  3:07 PM  Result Value Ref Range   Troponin i, poc 0.00 0.00 - 0.08 ng/mL   Comment 3            Comment: Due to the release kinetics of cTnI, a negative result within the first hours of the onset of symptoms does not rule out myocardial infarction with certainty. If myocardial infarction is still suspected, repeat the test at appropriate intervals.   Urine rapid drug screen (hosp performed)not at [facility]     Status: Abnormal   Collection Time: 11/06/17  2:00 AM  Result Value Ref Range   Opiates NONE DETECTED NONE DETECTED   Cocaine NONE DETECTED NONE DETECTED   Benzodiazepines NONE DETECTED NONE DETECTED   Amphetamines NONE DETECTED NONE DETECTED   Tetrahydrocannabinol NONE DETECTED NONE DETECTED   Barbiturates (A) NONE DETECTED    Result not available. Reagent lot number recalled by manufacturer.    Comment: (NOTE) DRUG SCREEN FOR MEDICAL PURPOSES ONLY.  IF CONFIRMATION IS NEEDED FOR ANY PURPOSE, NOTIFY LAB WITHIN 5 DAYS. LOWEST DETECTABLE LIMITS FOR URINE DRUG SCREEN Drug Class                     Cutoff (ng/mL) Amphetamine and metabolites    1000 Barbiturate and metabolites    200 Benzodiazepine                 563 Tricyclics and metabolites  300 Opiates and metabolites        300 Cocaine and metabolites        300 THC                            50 Performed at [facility], [address] [place], [place] [address]   Urinalysis, Routine w reflex microscopic (not at [facility])     Status: Abnormal   Collection Time: 11/06/17  2:00 AM  Result Value Ref Range   Color, Urine STRAW (A) YELLOW   APPearance CLEAR CLEAR   Specific Gravity, Urine 1.009 1.005 - 1.030   pH 6.0 5.0 - 8.0   Glucose, UA NEGATIVE NEGATIVE mg/dL   Hgb urine dipstick NEGATIVE NEGATIVE   Bilirubin Urine  NEGATIVE NEGATIVE   Ketones, ur NEGATIVE NEGATIVE mg/dL   Protein, ur 100 (A) NEGATIVE mg/dL   Nitrite NEGATIVE NEGATIVE   Leukocytes, UA NEGATIVE NEGATIVE   WBC, UA 0-5 0 - 5 WBC/hpf   Bacteria, UA RARE (A) NONE SEEN   Squamous Epithelial / LPF 0-5 0 - 5    Comment: Performed at [facility], [address] [place], [place] [address]  Lipid panel     Status: Abnormal   Collection Time: 11/06/17  4:33 AM  Result Value Ref Range   Cholesterol 205 (H) 0 - 200 mg/dL   Triglycerides 103 <150 mg/dL   HDL 53 >40 mg/dL   Total CHOL/HDL Ratio 3.9 RATIO   VLDL 21 0 - 40 mg/dL   LDL Cholesterol 131 (H) 0 - 99 mg/dL    Comment:        Total Cholesterol/HDL:CHD Risk Coronary Heart Disease Risk Table                     Men   Women  1/2 Average Risk   3.4   3.3  Average Risk       5.0   4.4  2 X Average Risk   9.6   7.1  3 X Average Risk  23.4   11.0        Use the calculated Patient Ratio above and the CHD Risk Table to determine the patient's CHD Risk.        ATP III CLASSIFICATION (LDL):  <100     mg/dL   Optimal  100-129  mg/dL   Near or Above                    Optimal  130-159  mg/dL   Borderline  160-189  mg/dL   High  >190     mg/dL   Very High Performed at [facility] [place], [place] [address]   Basic metabolic panel     Status: Abnormal   Collection Time: 11/06/17  4:33 AM  Result Value Ref Range   Sodium 143 135 - 145 mmol/L   Potassium 3.3 (L) 3.5 - 5.1 mmol/L   Chloride 108 98 - 111 mmol/L    Comment: Please note change in reference range.   CO2 26 22 - 32 mmol/L   Glucose, Bld 193 (H) 70 - 99 mg/dL    Comment: Please note change in reference range.   BUN 29 (H) 8 - 23 mg/dL    Comment: Please note change in reference range.   Creatinine, Ser 1.80 (H) 0.44 - 1.00 mg/dL   Calcium 8.5 (L) 8.9 - 10.3 mg/dL  GFR calc non Af Amer 25 (L) >60 mL/min   GFR calc Af Amer 29 (L) >60 mL/min    Comment: (NOTE) The eGFR has been calculated using the CKD  EPI equation. This calculation has not been validated in all clinical situations. eGFR's persistently <60 mL/min signify possible Chronic Kidney Disease.    Anion gap 9 5 - 15    Comment: Performed at [facility], [address] [place], [place] [address]  CBC     Status: Abnormal   Collection Time: 11/06/17  4:33 AM  Result Value Ref Range   WBC 7.2 4.0 - 10.5 K/uL   RBC 3.46 (L) 3.87 - 5.11 MIL/uL   Hemoglobin 9.5 (L) 12.0 - 15.0 g/dL   HCT 30.4 (L) 36.0 - 46.0 %   MCV 87.9 78.0 - 100.0 fL   MCH 27.5 26.0 - 34.0 pg   MCHC 31.3 30.0 - 36.0 g/dL   RDW 14.2 11.5 - 15.5 %   Platelets 213 150 - 400 K/uL    Comment: Performed at [facility], [address] [place], [place] [address]  Magnesium     Status: Abnormal   Collection Time: 11/06/17  4:33 AM  Result Value Ref Range   Magnesium 1.2 (L) 1.7 - 2.4 mg/dL    Comment: Performed at [facility], [address], [place], [place] [address]  Phosphorus     Status: None   Collection Time: 11/06/17  4:33 AM  Result Value Ref Range   Phosphorus 2.9 2.5 - 4.6 mg/dL    Comment: Performed at [facility], [address] [place], [place] [address]    Studies/Results:   BRAIN MRA  FINDINGS: Antegrade flow in the posterior circulation with tortuous distal vertebral arteries and vertebrobasilar junction. The left vertebral is mildly dominant B on the PICA origins. Both PICA origins are patent. Patent basilar artery without stenosis. Normal SCA and PCA origins. Both posterior communicating arteries are present. Bilateral PCA branches are patent. There is a mild to moderate short segment irregularity and stenosis of the left PCA P1/P2 junction (series 109, image 8).  Antegrade flow in both ICA siphons. Siphon irregularity but no siphon stenosis. Ophthalmic and posterior communicating artery origins are normal. Patent carotid termini. Normal MCA and ACA origins. The left A1 segment is dominant. Anterior communicating artery and  visible ACA branches are within normal limits. Bilateral MCA M1 segments and MCA bifurcations are patent without stenosis. Visible bilateral MCA branches are within normal limits.  IMPRESSION: 1. Negative for intracranial large vessel occlusion, but positive for a mild to moderate stenosis of the Left PCA at the P1/P2 segment junction. Query homonymous right visual field symptoms. 2. Mild for age intracranial atherosclerosis otherwise. Intracranial artery tortuosity.   NECK  MRA FINDINGS: Noncontrast time-of-flight neck MRA imaging reveals a bovine type aortic arch configuration with tortuous proximal great vessels. There is antegrade flow in both cervical carotid and vertebral arteries. The vertebral arteries are tortuous and codominant. No cervical vertebral artery stenosis identified.  No common carotid artery stenosis is evident. Both carotid bifurcations are tortuous and widely patent. Tortuous bilateral cervical ICAs, more so the right. Both ICAs have a kinked appearance due to tortuosity, but otherwise no stenosis.  IMPRESSION: Tortuous great vessels, cervical carotid and vertebral arteries but no arterial stenosis in the neck.       HEAD CT FINDINGS: Brain: Generalized atrophy. Normal ventricular morphology. No midline shift or mass effect. Minimal small vessel chronic ischemic changes of deep cerebral white  matter. No intracranial hemorrhage, mass lesion, or evidence of acute infarction. No extra-axial fluid collections.  Vascular: Minimal atherosclerotic calcification of internal carotid arteries at skull base  Skull: Intact  Sinuses/Orbits: Visualized paranasal sinuses and mastoid air cells clear.  Other: N/A  IMPRESSION: Atrophy with minimal small vessel chronic ischemic changes of deep cerebral white matter.         Nalini Alcaraz A. Chelsea Meza, M.D.  Diplomate, Tax adviser of Psychiatry and Neurology ( Neurology). 11/06/2017, 8:01 AM

## 2017-11-06 NOTE — Plan of Care (Signed)
  Problem: Acute Rehab PT Goals(only PT should resolve) Goal: Pt Will Transfer Bed To Chair/Chair To Bed Outcome: Progressing Flowsheets (Taken 11/06/2017 1126) Pt will Transfer Bed to Chair/Chair to Bed: Independently   Problem: Acute Rehab PT Goals(only PT should resolve) Goal: Pt Will Ambulate Outcome: Progressing Flowsheets (Taken 11/06/2017 1126) Pt will Ambulate: 100 feet;with modified independence   11:27 AM, 11/06/17 Chelsea Meza, MPT Physical Therapist with [facility] [phone] office [phone] mobile phone

## 2017-11-06 NOTE — Evaluation (Signed)
Physical Therapy Evaluation Patient Details Name: Chelsea Meza MRN: [id number] DOB: 10/02/1935 Today's Date: 11/06/2017   History of Present Illness  Chelsea Meza is a 82 y.o. female with medical history significant of chronic kidney disease, type 2 diabetes, hyperlipidemia, hypertension who is coming to the emergency department due to blurred vision around 1000 in the morning, which lasted for about an hour.  She she had a mild frontal headache, but denies focal weakness or numbness, dizziness, slurred speech or language comprehension difficulty.  There was no gait instability.  However, she complained of having mild nausea in the morning, which was follow later with 3 loose stools BMs before noon time.  She denies abdominal pain, emesis, further loose stools since then, melena or hematochezia.  No fever, chills, sore throat, dyspnea, wheezing, chest pain, palpitations, PND, orthopnea or recent pitting edema of the lower extremities.  Denies dysuria, frequency or hematuria.  No heat or cold intolerance.  No polyuria, polydipsia or polyphagia.  Denies of skin pruritus or rashes.    Clinical Impression  Patient functioning near baseline for functional mobility and gait other than occasional scissoring of RLE when taking steps resulting in slowing of cadence, no loss of balance and limited for ambulation mostly due to fatigue.  Patient will benefit from continued physical therapy in hospital and recommended venue below to increase strength, balance, endurance for safe ADLs and gait.     Follow Up Recommendations No PT follow up;Supervision - Intermittent    Equipment Recommendations  None recommended by PT    Recommendations for Other Services       Precautions / Restrictions Precautions Precautions: None Restrictions Weight Bearing Restrictions: No      Mobility  Bed Mobility Overal bed mobility: Independent                Transfers Overall transfer level: Modified  independent Equipment used: None                Ambulation/Gait Ambulation/Gait assistance: Supervision Gait Distance (Feet): 75 Feet Assistive device: None Gait Pattern/deviations: Decreased step length - right;Decreased step length - left;Decreased stride length;Scissoring Gait velocity: decreased   General Gait Details: slightly labored slow cadence with occasional scissoring of RLE resulting in slowing of velocity, limited mostly due to c/o fatigue, no loss of balance  Stairs            Wheelchair Mobility    Modified Rankin (Stroke Patients Only)       Balance Overall balance assessment: Mild deficits observed, not formally tested                                           Pertinent Vitals/Pain Pain Assessment: No/denies pain    Home Living Family/patient expects to be discharged to:: Private residence Living Arrangements: Children Available Help at Discharge: Family Type of Home: Apartment Home Access: Level entry     Home Layout: One level Home Equipment: Kasandra Knudsen - single point      Prior Function Level of Independence: Independent         Comments: community ambulator     Journalist, newspaper        Extremity/Trunk Assessment   Upper Extremity Assessment Upper Extremity Assessment: Defer to OT evaluation    Lower Extremity Assessment Lower Extremity Assessment: Overall WFL for tasks assessed    Cervical / Trunk Assessment Cervical /  Trunk Assessment: Normal  Communication   Communication: No difficulties  Cognition Arousal/Alertness: Awake/alert Behavior During Therapy: WFL for tasks assessed/performed Overall Cognitive Status: Within Functional Limits for tasks assessed                                        General Comments      Exercises     Assessment/Plan    PT Assessment Patient needs continued PT services  PT Problem List Decreased activity tolerance;Decreased balance;Decreased  mobility;Decreased coordination       PT Treatment Interventions Gait training;Stair training;Functional mobility training;Therapeutic activities;Therapeutic exercise;Patient/family education    PT Goals (Current goals can be found in the Care Plan section)  Acute Rehab PT Goals Patient Stated Goal: return home PT Goal Formulation: With patient Time For Goal Achievement: 11/09/17 Potential to Achieve Goals: Good    Frequency 7X/week   Barriers to discharge        Co-evaluation               AM-PAC PT "6 Clicks" Daily Activity  Outcome Measure Difficulty turning over in bed (including adjusting bedclothes, sheets and blankets)?: None Difficulty moving from lying on back to sitting on the side of the bed? : None Difficulty sitting down on and standing up from a chair with arms (e.g., wheelchair, bedside commode, etc,.)?: None Help needed moving to and from a bed to chair (including a wheelchair)?: None Help needed walking in hospital room?: A Little Help needed climbing 3-5 steps with a railing? : A Little 6 Click Score: 22    End of Session   Activity Tolerance: Patient tolerated treatment well;Patient limited by fatigue Patient left: in bed;with call bell/phone within reach(seated at bedside) Nurse Communication: Mobility status PT Visit Diagnosis: Unsteadiness on feet (R26.81);Other abnormalities of gait and mobility (R26.89);Muscle weakness (generalized) (M62.81)    Time: 9357-0177 PT Time Calculation (min) (ACUTE ONLY): 25 min   Charges:   PT Evaluation $PT Eval Moderate Complexity: 1 Mod PT Treatments $Therapeutic Activity: 23-37 mins   PT G Codes:        11:21 AM, November 21, 2017 [name], MPT Physical Therapist with [facility] [phone] office [phone] mobile phone

## 2017-11-06 NOTE — Progress Notes (Signed)
PROGRESS NOTE  Chelsea Meza QZR:[id number] DOB: 01/12/1936 DOA: 11/05/2017 PCP: Iona Beard, MD  Brief History:  82 year old female with a history of CKD stage III, TIA, hyperlipidemia, hypertension, SVT, diabetes mellitus presenting with visual disturbance.  The patient states that around 10 AM on 11/05/2017, her visual fields became "white" and she could not see anything.  She stated that lasted approximately 30 minutes.  The patient was sitting on her porch waiting for transportation to go to her primary care doctor when this occurred. Patient denies fevers, chills, headache, chest pain, dyspnea, nausea, vomiting, diarrhea, abdominal pain, dysuria, hematuria, hematochezia, and melena.  She denies any focal extremity weakness, dysarthria, dysphasia.  By the time EMS arrived, the patient stated that her vision had returned back to normal.  The patient denies any new medications or over-the-counter medications. In the emergency department, the patient was afebrile hemodynamically stable saturating 100% on room air.  BMP showed a serum creatinine 2.00.  Magnesium was 1.2.  CBC was essentially unremarkable.  MRI of the brain was negative for large vessel occlusion.  CT brain was negative.  Neurology consult was requested to assist with management.  They did not feel the patient's presentation was consistent with stroke or TIA.  Initially, permissive hypertension was permitted.  Once it was evident the patient did not likely have a TIA/stroke, the patient hypertension was treated with restarting her hydralazine and increasing her verapamil.    Assessment/Plan: Visual disturbance -Etiology unclear -Neurology consult appreciated -PT/OT evaluation--no follow up necessar -Speech therapy eval--regular diet -CT brain--neg -MRI brain--neg for acute findings -MRA brain--neg for large vessel occlusion, mild to moderate stenosis of left PCA -MRA neck--no arterial stenosis in the neck -Echo--EF  65 to 70%, grade 1 DD, no WMA -LDL--131 -HbA1C--6.7 -Antiplatelet--aspirin 81 mg daily -EEG--results pending  Paroxysmal SVT -Check TSH--3.049 -Echocardiogram--EF 65 to 70%, grade 1 DD, no WMA -increase calan 120 mg tid  Acute on chronic renal failure --CKD stage III -Baseline creatinine 1.2-1.5 -Presenting creatinine 2.0 -Improving with IV fluids  Diabetes mellitus type 2 -Hemoglobin A1c--pending at time of d/c -NovoLog sliding scale -Holding sitagliptin-metformin and Amaryl-->restart after d/c  Hypokalemia/hypomagnesemia -Repleted  Hyperlipidemia -Continue statin -Increase Lipitor to 80 mg daily -LDL 131  Essential hypertension -Continue verapamil--increase to 120 mg tid -restart hydralazine         Disposition Plan:   Home 11/06/17 Family Communication:   NoFamily at bedside  Consultants:   none  Code Status:  FULL   DVT Prophylaxis:  [place] Lovenox   Procedures: As Listed in Progress Note Above  Antibiotics: None    Subjective: Patient denies fevers, chills, headache, chest pain, dyspnea, nausea, vomiting, diarrhea, abdominal pain, dysuria, hematuria, hematochezia, and melena.   Objective: Vitals:   11/06/17 0602 11/06/17 0804 11/06/17 1011 11/06/17 1411  BP: (!) 192/109 (!) 208/112 (!) 180/89 (!) 206/95  Pulse: 73 76 82 80  Resp: 18 16 16 20   Temp: 98.7 F (37.1 C) 98.7 F (37.1 C)  98.1 F (36.7 C)  TempSrc: Oral Oral  Oral  SpO2: 98% 97% 96% 98%  Weight:      Height:        Intake/Output Summary (Last 24 hours) at 11/06/2017 1752 Last data filed at 11/06/2017 1703 Gross per 24 hour  Intake 1360.4 ml  Output 900 ml  Net 460.4 ml   Weight change:  Exam:   General:  Pt is alert, follows commands appropriately, not in  acute distress  HEENT: No icterus, No thrush, No neck mass, [place]/AT  Cardiovascular: RRR, S1/S2, no rubs, no gallops  Respiratory: CTA bilaterally, no wheezing, no crackles, no rhonchi  Abdomen: Soft/+BS, non  tender, non distended, no guarding  Extremities: No edema, No lymphangitis, No petechiae, No rashes, no synovitis   Data Reviewed: I have personally reviewed following labs and imaging studies Basic Metabolic Panel: Recent Labs  Lab 11/05/17 1447 11/06/17 0433  NA 140 143  K 3.3* 3.3*  CL 104 108  CO2 23 26  GLUCOSE 175* 193*  BUN 31* 29*  CREATININE 2.00* 1.80*  CALCIUM 8.9 8.5*  MG  --  1.2*  PHOS  --  2.9   Liver Function Tests: Recent Labs  Lab 11/05/17 1447  AST 25  ALT 19  ALKPHOS 66  BILITOT 0.6  PROT 7.0  ALBUMIN 3.3*   No results for input(s): LIPASE, AMYLASE in the last 168 hours. No results for input(s): AMMONIA in the last 168 hours. Coagulation Profile: Recent Labs  Lab 11/05/17 1447  INR 0.99   CBC: Recent Labs  Lab 11/05/17 1447 11/06/17 0433  WBC 10.8* 7.2  NEUTROABS 6.5  --   HGB 10.9* 9.5*  HCT 34.2* 30.4*  MCV 87.5 87.9  PLT 245 213   Cardiac Enzymes: No results for input(s): CKTOTAL, CKMB, CKMBINDEX, TROPONINI in the last 168 hours. BNP: Invalid input(s): POCBNP CBG: Recent Labs  Lab 11/06/17 0802 11/06/17 1118 11/06/17 1705  GLUCAP 92 240* 97   HbA1C: No results for input(s): HGBA1C in the last 72 hours. Urine analysis:    Component Value Date/Time   COLORURINE STRAW (A) 11/06/2017 0200   APPEARANCEUR CLEAR 11/06/2017 0200   LABSPEC 1.009 11/06/2017 0200   PHURINE 6.0 11/06/2017 0200   GLUCOSEU NEGATIVE 11/06/2017 0200   HGBUR NEGATIVE 11/06/2017 0200   BILIRUBINUR NEGATIVE 11/06/2017 0200   KETONESUR NEGATIVE 11/06/2017 0200   PROTEINUR 100 (A) 11/06/2017 0200   UROBILINOGEN 0.2 07/13/2012 1236   NITRITE NEGATIVE 11/06/2017 0200   LEUKOCYTESUR NEGATIVE 11/06/2017 0200   Sepsis Labs: @LABRCNTIP (procalcitonin:4,lacticidven:4) )No results found for this or any previous visit (from the past 240 hour(s)).   Scheduled Meds: . aspirin EC  81 mg Oral Daily  . atorvastatin  40 mg Oral Daily  . enoxaparin (LOVENOX)  injection  30 mg Subcutaneous Q24H  . hydrALAZINE  50 mg Oral Q8H  . insulin aspart  0-9 Units Subcutaneous TID WC  . verapamil  120 mg Oral TID   Continuous Infusions:  Procedures/Studies: Dg Chest 2 View  Result Date: 11/05/2017 CLINICAL DATA:  TIA.  History of hypertension, diabetes. EXAM: CHEST - 2 VIEW COMPARISON:  Chest radiograph March 20, 2015 FINDINGS: Cardiac silhouette is moderately enlarged unchanged. Tortuous aorta associated with chronic hypertension. Calcified aortic arch. Mild bronchitic changes without pleural effusion or focal consolidation. No pneumothorax. Osteopenia. Wires projecting in the mouth. Soft tissue planes are non suspicious. Surgical clips in the abdomen. IMPRESSION: Stable cardiomegaly. Mild bronchitic changes without focal consolidation. Aortic Atherosclerosis (ICD10-I70.0). Electronically Signed   By: Elon Alas M.D.   On: 11/05/2017 20:39   Ct Head Wo Contrast  Result Date: 11/05/2017 CLINICAL DATA:  Altered level of consciousness since 1000 hours this morning, blurred vision, diabetes mellitus, hypertension, renal dysfunction, fell out of bed 2 weeks ago EXAM: CT HEAD WITHOUT CONTRAST TECHNIQUE: Contiguous axial images were obtained from the base of the skull through the vertex without intravenous contrast. Sagittal and coronal MPR images reconstructed from axial  data set. COMPARISON:  07/13/2012 FINDINGS: Brain: Generalized atrophy. Normal ventricular morphology. No midline shift or mass effect. Minimal small vessel chronic ischemic changes of deep cerebral white matter. No intracranial hemorrhage, mass lesion, or evidence of acute infarction. No extra-axial fluid collections. Vascular: Minimal atherosclerotic calcification of internal carotid arteries at skull base Skull: Intact Sinuses/Orbits: Visualized paranasal sinuses and mastoid air cells clear. Other: N/A IMPRESSION: Atrophy with minimal small vessel chronic ischemic changes of deep cerebral white  matter. No acute intracranial abnormalities. Electronically Signed   By: Lavonia Dana M.D.   On: 11/05/2017 16:18   Mr Jodene Nam Head Wo Contrast  Result Date: 11/05/2017 CLINICAL DATA:  82 year old female with blurred vision since 1000 hours today. EXAM: MRA HEAD WITHOUT CONTRAST TECHNIQUE: Angiographic images of the [place] were obtained using MRA technique without intravenous contrast. COMPARISON:  Neck MRA without contrast reported separately today. FINDINGS: Antegrade flow in the posterior circulation with tortuous distal vertebral arteries and vertebrobasilar junction. The left vertebral is mildly dominant B on the PICA origins. Both PICA origins are patent. Patent basilar artery without stenosis. Normal SCA and PCA origins. Both posterior communicating arteries are present. Bilateral PCA branches are patent. There is a mild to moderate short segment irregularity and stenosis of the left PCA P1/P2 junction (series 109, image 8). Antegrade flow in both ICA siphons. Siphon irregularity but no siphon stenosis. Ophthalmic and posterior communicating artery origins are normal. Patent carotid termini. Normal MCA and ACA origins. The left A1 segment is dominant. Anterior communicating artery and visible ACA branches are within normal limits. Bilateral MCA M1 segments and MCA bifurcations are patent without stenosis. Visible bilateral MCA branches are within normal limits. IMPRESSION: 1. Negative for intracranial large vessel occlusion, but positive for a mild to moderate stenosis of the Left PCA at the P1/P2 segment junction. Query homonymous right visual field symptoms. 2. Mild for age intracranial atherosclerosis otherwise. Intracranial artery tortuosity. Electronically Signed   By: Genevie Ann M.D.   On: 11/05/2017 18:27   Mr Jodene Nam Neck Wo Contrast  Result Date: 11/05/2017 CLINICAL DATA:  82 year old female with blurred vision since 1000 hours today. EXAM: MRA NECK WITHOUT CONTRAST TECHNIQUE: Angiographic images  of the neck were obtained using MRA technique without intravenous contrast. Carotid stenosis measurements (when applicable) are obtained utilizing NASCET criteria, using the distal internal carotid diameter as the denominator. COMPARISON:  Head CT without contrast 1556 hours today. FINDINGS: Noncontrast time-of-flight neck MRA imaging reveals a bovine type aortic arch configuration with tortuous proximal great vessels. There is antegrade flow in both cervical carotid and vertebral arteries. The vertebral arteries are tortuous and codominant. No cervical vertebral artery stenosis identified. No common carotid artery stenosis is evident. Both carotid bifurcations are tortuous and widely patent. Tortuous bilateral cervical ICAs, more so the right. Both ICAs have a kinked appearance due to tortuosity, but otherwise no stenosis. IMPRESSION: Tortuous great vessels, cervical carotid and vertebral arteries but no arterial stenosis in the neck. Electronically Signed   By: Genevie Ann M.D.   On: 11/05/2017 18:21   Mr Brain Wo Contrast  Result Date: 11/06/2017 CLINICAL DATA:  Acute presentation with blurred vision. EXAM: MRI HEAD WITHOUT CONTRAST TECHNIQUE: Multiplanar, multiecho pulse sequences of the brain and surrounding structures were obtained without intravenous contrast. COMPARISON:  CT 11/05/2017.  MR angiography 11/05/2017. FINDINGS: Brain: Normal examination for age without evidence of advanced atrophy, acute or subacute infarction, mass lesion, hemorrhage, hydrocephalus or extra-axial collection. Minimal small vessel change of the cerebral  hemispheric white matter and thalami. Vascular: Major vessels at the base of the brain show flow. Skull and upper cervical spine: Negative Sinuses/Orbits: Clear/normal Other: None IMPRESSION: No acute finding. No explanation for blurred vision. Mild age related volume loss. Minimal small vessel change of the hemispheric white matter and thalami, less than often seen healthy  individuals of this age. Electronically Signed   By: Nelson Chimes M.D.   On: 11/06/2017 09:26   US Carotid Bilateral (at [facility] Only)  Result Date: 11/06/2017 CLINICAL DATA:  82 year old female with bilateral visual disturbances EXAM: BILATERAL CAROTID DUPLEX ULTRASOUND TECHNIQUE: Pearline Cables scale imaging, color Doppler and duplex ultrasound were performed of bilateral carotid and vertebral arteries in the neck. COMPARISON:  None. FINDINGS: Criteria: Quantification of carotid stenosis is based on velocity parameters that correlate the residual internal carotid diameter with NASCET-based stenosis levels, using the diameter of the distal internal carotid lumen as the denominator for stenosis measurement. The following velocity measurements were obtained: RIGHT ICA: 90/26 cm/sec CCA: 88/32 cm/sec SYSTOLIC ICA/CCA RATIO:  1.2 ECA:  62 cm/sec LEFT ICA: 76/28 cm/sec CCA: 54/98 cm/sec SYSTOLIC ICA/CCA RATIO:  2.64 ECA:  75 cm/sec RIGHT CAROTID ARTERY: No significant atherosclerotic plaque or evidence of stenosis. RIGHT VERTEBRAL ARTERY:  Patent with normal antegrade flow. LEFT CAROTID ARTERY: No significant atherosclerotic plaque or evidence of stenosis. The mid internal carotid artery is highly tortuous. LEFT VERTEBRAL ARTERY:  Patent with normal antegrade flow. IMPRESSION: 1. No evidence of significant atherosclerotic plaque or internal carotid artery stenosis. 2. Vertebral arteries are patent with antegrade flow. Electronically Signed   By: Jacqulynn Cadet M.D.   On: 11/06/2017 09:24    Orson Eva, DO  [facility] Pager [phone]  If 7PM-7AM, please contact night-coverage [email or web address] Password [email or web address] 11/06/2017, 5:52 PM   LOS: 0 days

## 2017-11-06 NOTE — Discharge Summary (Signed)
Physician Discharge Summary  Chelsea Meza YQM:[id number] DOB: 22-Sep-1935 DOA: 11/05/2017  PCP: Iona Beard, MD  Admit date: 11/05/2017 Discharge date: 11/07/17  Admitted From:Home Disposition:  Home   Recommendations for Outpatient Follow-up:  1. Follow up with PCP in 1-2 weeks 2. Please obtain BMP/CBC in one week   Discharge Condition: Stable CODE STATUS: FULL Diet recommendation: Heart Healthy    Brief/Interim Summary: 82 year old female with a history of CKD stage III, TIA, hyperlipidemia, hypertension, SVT, diabetes mellitus presenting with visual disturbance.  The patient states that around 10 AM on 11/05/2017, her visual fields became "white" and she could not see anything.  She stated that lasted approximately 30 minutes.  The patient was sitting on her porch waiting for transportation to go to her primary care doctor when this occurred. Patient denies fevers, chills, headache, chest pain, dyspnea, nausea, vomiting, diarrhea, abdominal pain, dysuria, hematuria, hematochezia, and melena.  She denies any focal extremity weakness, dysarthria, dysphasia.  By the time EMS arrived, the patient stated that her vision had returned back to normal.  The patient denies any new medications or over-the-counter medications. In the emergency department, the patient was afebrile hemodynamically stable saturating 100% on room air.  BMP showed a serum creatinine 2.00.  Magnesium was 1.2.  CBC was essentially unremarkable.  MRI of the brain was negative for large vessel occlusion.  CT brain was negative.  Neurology consult was requested to assist with management.  They did not feel the patient's presentation was consistent with stroke or TIA.  Initially, permissive hypertension was permitted.  Once it was evident the patient did not likely have a TIA/stroke, the patient hypertension was treated with restarting her hydralazine and increasing her verapamil.     Discharge Diagnoses:   Visual  disturbance -Etiology unclear -Neurology consult appreciated -PT/OT evaluation--no follow up necessar -Speech therapy eval--regular diet -CT brain--neg -MRI brain--neg for acute findings -MRA brain--neg for large vessel occlusion, mild to moderate stenosis of left PCA -MRA neck--no arterial stenosis in the neck -Echo--EF 65 to 70%, grade 1 DD, no WMA -LDL--131 -HbA1C--6.7 -Antiplatelet--aspirin 81 mg daily -EEG--results pending  Paroxysmal SVT -Check TSH--3.049 -Echocardiogram--EF 65 to 70%, grade 1 DD, no WMA -increase calan 120 mg tid  Acute on chronic renal failure --CKD stage III -Baseline creatinine 1.2-1.5 -Presenting creatinine 2.0 -Improving with IV fluids  Diabetes mellitus type 2 -Hemoglobin A1c--pending at time of d/c -NovoLog sliding scale -Holding sitagliptin-metformin and Amaryl-->restart after d/c  Hypokalemia/hypomagnesemia -Repleted  Hyperlipidemia -Continue statin -Increase Lipitor to 80 mg daily -LDL 131  Essential hypertension -Continue verapamil--increase to 120 mg tid -restart hydralazine        Discharge Instructions   Allergies as of 11/06/2017   No Known Allergies     Medication List    TAKE these medications   aspirin EC 81 MG tablet Take 81-325 mg by mouth daily.   atorvastatin 80 MG tablet Commonly known as:  LIPITOR Take 1 tablet (80 mg total) by mouth daily. Start taking on:  11/07/2017 What changed:    medication strength  how much to take   glimepiride 4 MG tablet Commonly known as:  AMARYL Take 2 tablets (8 mg total) by mouth daily with breakfast. What changed:  how much to take   hydrALAZINE 50 MG tablet Commonly known as:  APRESOLINE Take 1 tablet (50 mg total) by mouth 3 (three) times daily. What changed:  how much to take   sitaGLIPtin-metformin 50-1000 MG tablet Commonly known as:  JANUMET Take  1 tablet by mouth 2 (two) times daily with a meal. What changed:  when to take this   verapamil 120 MG  tablet Commonly known as:  CALAN Take 1 tablet (120 mg total) by mouth 3 (three) times daily. What changed:    medication strength  how much to take       No Known Allergies  Consultations:  Neurology   Procedures/Studies: Dg Chest 2 View  Result Date: 11/05/2017 CLINICAL DATA:  TIA.  History of hypertension, diabetes. EXAM: CHEST - 2 VIEW COMPARISON:  Chest radiograph March 20, 2015 FINDINGS: Cardiac silhouette is moderately enlarged unchanged. Tortuous aorta associated with chronic hypertension. Calcified aortic arch. Mild bronchitic changes without pleural effusion or focal consolidation. No pneumothorax. Osteopenia. Wires projecting in the mouth. Soft tissue planes are non suspicious. Surgical clips in the abdomen. IMPRESSION: Stable cardiomegaly. Mild bronchitic changes without focal consolidation. Aortic Atherosclerosis (ICD10-I70.0). Electronically Signed   By: Elon Alas M.D.   On: 11/05/2017 20:39   Ct Head Wo Contrast  Result Date: 11/05/2017 CLINICAL DATA:  Altered level of consciousness since 1000 hours this morning, blurred vision, diabetes mellitus, hypertension, renal dysfunction, fell out of bed 2 weeks ago EXAM: CT HEAD WITHOUT CONTRAST TECHNIQUE: Contiguous axial images were obtained from the base of the skull through the vertex without intravenous contrast. Sagittal and coronal MPR images reconstructed from axial data set. COMPARISON:  07/13/2012 FINDINGS: Brain: Generalized atrophy. Normal ventricular morphology. No midline shift or mass effect. Minimal small vessel chronic ischemic changes of deep cerebral white matter. No intracranial hemorrhage, mass lesion, or evidence of acute infarction. No extra-axial fluid collections. Vascular: Minimal atherosclerotic calcification of internal carotid arteries at skull base Skull: Intact Sinuses/Orbits: Visualized paranasal sinuses and mastoid air cells clear. Other: N/A IMPRESSION: Atrophy with minimal small vessel  chronic ischemic changes of deep cerebral white matter. No acute intracranial abnormalities. Electronically Signed   By: Lavonia Dana M.D.   On: 11/05/2017 16:18   Mr Jodene Nam Head Wo Contrast  Result Date: 11/05/2017 CLINICAL DATA:  82 year old female with blurred vision since 1000 hours today. EXAM: MRA HEAD WITHOUT CONTRAST TECHNIQUE: Angiographic images of the Circle of [place] were obtained using MRA technique without intravenous contrast. COMPARISON:  Neck MRA without contrast reported separately today. FINDINGS: Antegrade flow in the posterior circulation with tortuous distal vertebral arteries and vertebrobasilar junction. The left vertebral is mildly dominant B on the PICA origins. Both PICA origins are patent. Patent basilar artery without stenosis. Normal SCA and PCA origins. Both posterior communicating arteries are present. Bilateral PCA branches are patent. There is a mild to moderate short segment irregularity and stenosis of the left PCA P1/P2 junction (series 109, image 8). Antegrade flow in both ICA siphons. Siphon irregularity but no siphon stenosis. Ophthalmic and posterior communicating artery origins are normal. Patent carotid termini. Normal MCA and ACA origins. The left A1 segment is dominant. Anterior communicating artery and visible ACA branches are within normal limits. Bilateral MCA M1 segments and MCA bifurcations are patent without stenosis. Visible bilateral MCA branches are within normal limits. IMPRESSION: 1. Negative for intracranial large vessel occlusion, but positive for a mild to moderate stenosis of the Left PCA at the P1/P2 segment junction. Query homonymous right visual field symptoms. 2. Mild for age intracranial atherosclerosis otherwise. Intracranial artery tortuosity. Electronically Signed   By: Genevie Ann M.D.   On: 11/05/2017 18:27   Mr Jodene Nam Neck Wo Contrast  Result Date: 11/05/2017 CLINICAL DATA:  82 year old female with blurred vision  since 1000 hours today. EXAM: MRA NECK  WITHOUT CONTRAST TECHNIQUE: Angiographic images of the neck were obtained using MRA technique without intravenous contrast. Carotid stenosis measurements (when applicable) are obtained utilizing NASCET criteria, using the distal internal carotid diameter as the denominator. COMPARISON:  Head CT without contrast 1556 hours today. FINDINGS: Noncontrast time-of-flight neck MRA imaging reveals a bovine type aortic arch configuration with tortuous proximal great vessels. There is antegrade flow in both cervical carotid and vertebral arteries. The vertebral arteries are tortuous and codominant. No cervical vertebral artery stenosis identified. No common carotid artery stenosis is evident. Both carotid bifurcations are tortuous and widely patent. Tortuous bilateral cervical ICAs, more so the right. Both ICAs have a kinked appearance due to tortuosity, but otherwise no stenosis. IMPRESSION: Tortuous great vessels, cervical carotid and vertebral arteries but no arterial stenosis in the neck. Electronically Signed   By: Genevie Ann M.D.   On: 11/05/2017 18:21   Mr Brain Wo Contrast  Result Date: 11/06/2017 CLINICAL DATA:  Acute presentation with blurred vision. EXAM: MRI HEAD WITHOUT CONTRAST TECHNIQUE: Multiplanar, multiecho pulse sequences of the brain and surrounding structures were obtained without intravenous contrast. COMPARISON:  CT 11/05/2017.  MR angiography 11/05/2017. FINDINGS: Brain: Normal examination for age without evidence of advanced atrophy, acute or subacute infarction, mass lesion, hemorrhage, hydrocephalus or extra-axial collection. Minimal small vessel change of the cerebral hemispheric white matter and thalami. Vascular: Major vessels at the base of the brain show flow. Skull and upper cervical spine: Negative Sinuses/Orbits: Clear/normal Other: None IMPRESSION: No acute finding. No explanation for blurred vision. Mild age related volume loss. Minimal small vessel change of the hemispheric white matter  and thalami, less than often seen healthy individuals of this age. Electronically Signed   By: Nelson Chimes M.D.   On: 11/06/2017 09:26   US Carotid Bilateral (at [facility] And Ap Only)  Result Date: 11/06/2017 CLINICAL DATA:  82 year old female with bilateral visual disturbances EXAM: BILATERAL CAROTID DUPLEX ULTRASOUND TECHNIQUE: Pearline Cables scale imaging, color Doppler and duplex ultrasound were performed of bilateral carotid and vertebral arteries in the neck. COMPARISON:  None. FINDINGS: Criteria: Quantification of carotid stenosis is based on velocity parameters that correlate the residual internal carotid diameter with NASCET-based stenosis levels, using the diameter of the distal internal carotid lumen as the denominator for stenosis measurement. The following velocity measurements were obtained: RIGHT ICA: 90/26 cm/sec CCA: 42/59 cm/sec SYSTOLIC ICA/CCA RATIO:  1.2 ECA:  62 cm/sec LEFT ICA: 76/28 cm/sec CCA: 56/38 cm/sec SYSTOLIC ICA/CCA RATIO:  7.56 ECA:  75 cm/sec RIGHT CAROTID ARTERY: No significant atherosclerotic plaque or evidence of stenosis. RIGHT VERTEBRAL ARTERY:  Patent with normal antegrade flow. LEFT CAROTID ARTERY: No significant atherosclerotic plaque or evidence of stenosis. The mid internal carotid artery is highly tortuous. LEFT VERTEBRAL ARTERY:  Patent with normal antegrade flow. IMPRESSION: 1. No evidence of significant atherosclerotic plaque or internal carotid artery stenosis. 2. Vertebral arteries are patent with antegrade flow. Electronically Signed   By: Jacqulynn Cadet M.D.   On: 11/06/2017 09:24        Discharge Exam: Vitals:   11/06/17 1011 11/06/17 1411  BP: (!) 180/89 (!) 206/95  Pulse: 82 80  Resp: 16 20  Temp:  98.1 F (36.7 C)  SpO2: 96% 98%   Vitals:   11/06/17 0602 11/06/17 0804 11/06/17 1011 11/06/17 1411  BP: (!) 192/109 (!) 208/112 (!) 180/89 (!) 206/95  Pulse: 73 76 82 80  Resp: 18 16 16 20   Temp: 98.7 F (  37.1 C) 98.7 F (37.1 C)  98.1 F (36.7 C)    TempSrc: Oral Oral  Oral  SpO2: 98% 97% 96% 98%  Weight:      Height:        General: Pt is alert, awake, not in acute distress Cardiovascular: RRR, S1/S2 +, no rubs, no gallops Respiratory: CTA bilaterally, no wheezing, no rhonchi Abdominal: Soft, NT, ND, bowel sounds + Extremities: no edema, no cyanosis   The results of significant diagnostics from this hospitalization (including imaging, microbiology, ancillary and laboratory) are listed below for reference.    Significant Diagnostic Studies: Dg Chest 2 View  Result Date: 11/05/2017 CLINICAL DATA:  TIA.  History of hypertension, diabetes. EXAM: CHEST - 2 VIEW COMPARISON:  Chest radiograph March 20, 2015 FINDINGS: Cardiac silhouette is moderately enlarged unchanged. Tortuous aorta associated with chronic hypertension. Calcified aortic arch. Mild bronchitic changes without pleural effusion or focal consolidation. No pneumothorax. Osteopenia. Wires projecting in the mouth. Soft tissue planes are non suspicious. Surgical clips in the abdomen. IMPRESSION: Stable cardiomegaly. Mild bronchitic changes without focal consolidation. Aortic Atherosclerosis (ICD10-I70.0). Electronically Signed   By: Elon Alas M.D.   On: 11/05/2017 20:39   Ct Head Wo Contrast  Result Date: 11/05/2017 CLINICAL DATA:  Altered level of consciousness since 1000 hours this morning, blurred vision, diabetes mellitus, hypertension, renal dysfunction, fell out of bed 2 weeks ago EXAM: CT HEAD WITHOUT CONTRAST TECHNIQUE: Contiguous axial images were obtained from the base of the skull through the vertex without intravenous contrast. Sagittal and coronal MPR images reconstructed from axial data set. COMPARISON:  07/13/2012 FINDINGS: Brain: Generalized atrophy. Normal ventricular morphology. No midline shift or mass effect. Minimal small vessel chronic ischemic changes of deep cerebral white matter. No intracranial hemorrhage, mass lesion, or evidence of acute  infarction. No extra-axial fluid collections. Vascular: Minimal atherosclerotic calcification of internal carotid arteries at skull base Skull: Intact Sinuses/Orbits: Visualized paranasal sinuses and mastoid air cells clear. Other: N/A IMPRESSION: Atrophy with minimal small vessel chronic ischemic changes of deep cerebral white matter. No acute intracranial abnormalities. Electronically Signed   By: Lavonia Dana M.D.   On: 11/05/2017 16:18   Mr Jodene Nam Head Wo Contrast  Result Date: 11/05/2017 CLINICAL DATA:  82 year old female with blurred vision since 1000 hours today. EXAM: MRA HEAD WITHOUT CONTRAST TECHNIQUE: Angiographic images of the Circle of [place] were obtained using MRA technique without intravenous contrast. COMPARISON:  Neck MRA without contrast reported separately today. FINDINGS: Antegrade flow in the posterior circulation with tortuous distal vertebral arteries and vertebrobasilar junction. The left vertebral is mildly dominant B on the PICA origins. Both PICA origins are patent. Patent basilar artery without stenosis. Normal SCA and PCA origins. Both posterior communicating arteries are present. Bilateral PCA branches are patent. There is a mild to moderate short segment irregularity and stenosis of the left PCA P1/P2 junction (series 109, image 8). Antegrade flow in both ICA siphons. Siphon irregularity but no siphon stenosis. Ophthalmic and posterior communicating artery origins are normal. Patent carotid termini. Normal MCA and ACA origins. The left A1 segment is dominant. Anterior communicating artery and visible ACA branches are within normal limits. Bilateral MCA M1 segments and MCA bifurcations are patent without stenosis. Visible bilateral MCA branches are within normal limits. IMPRESSION: 1. Negative for intracranial large vessel occlusion, but positive for a mild to moderate stenosis of the Left PCA at the P1/P2 segment junction. Query homonymous right visual field symptoms. 2. Mild for age  intracranial atherosclerosis otherwise. Intracranial artery  tortuosity. Electronically Signed   By: Genevie Ann M.D.   On: 11/05/2017 18:27   Mr Jodene Nam Neck Wo Contrast  Result Date: 11/05/2017 CLINICAL DATA:  82 year old female with blurred vision since 1000 hours today. EXAM: MRA NECK WITHOUT CONTRAST TECHNIQUE: Angiographic images of the neck were obtained using MRA technique without intravenous contrast. Carotid stenosis measurements (when applicable) are obtained utilizing NASCET criteria, using the distal internal carotid diameter as the denominator. COMPARISON:  Head CT without contrast 1556 hours today. FINDINGS: Noncontrast time-of-flight neck MRA imaging reveals a bovine type aortic arch configuration with tortuous proximal great vessels. There is antegrade flow in both cervical carotid and vertebral arteries. The vertebral arteries are tortuous and codominant. No cervical vertebral artery stenosis identified. No common carotid artery stenosis is evident. Both carotid bifurcations are tortuous and widely patent. Tortuous bilateral cervical ICAs, more so the right. Both ICAs have a kinked appearance due to tortuosity, but otherwise no stenosis. IMPRESSION: Tortuous great vessels, cervical carotid and vertebral arteries but no arterial stenosis in the neck. Electronically Signed   By: Genevie Ann M.D.   On: 11/05/2017 18:21   Mr Brain Wo Contrast  Result Date: 11/06/2017 CLINICAL DATA:  Acute presentation with blurred vision. EXAM: MRI HEAD WITHOUT CONTRAST TECHNIQUE: Multiplanar, multiecho pulse sequences of the brain and surrounding structures were obtained without intravenous contrast. COMPARISON:  CT 11/05/2017.  MR angiography 11/05/2017. FINDINGS: Brain: Normal examination for age without evidence of advanced atrophy, acute or subacute infarction, mass lesion, hemorrhage, hydrocephalus or extra-axial collection. Minimal small vessel change of the cerebral hemispheric white matter and thalami. Vascular:  Major vessels at the base of the brain show flow. Skull and upper cervical spine: Negative Sinuses/Orbits: Clear/normal Other: None IMPRESSION: No acute finding. No explanation for blurred vision. Mild age related volume loss. Minimal small vessel change of the hemispheric white matter and thalami, less than often seen healthy individuals of this age. Electronically Signed   By: Nelson Chimes M.D.   On: 11/06/2017 09:26   US Carotid Bilateral (at [facility] And Ap Only)  Result Date: 11/06/2017 CLINICAL DATA:  82 year old female with bilateral visual disturbances EXAM: BILATERAL CAROTID DUPLEX ULTRASOUND TECHNIQUE: Pearline Cables scale imaging, color Doppler and duplex ultrasound were performed of bilateral carotid and vertebral arteries in the neck. COMPARISON:  None. FINDINGS: Criteria: Quantification of carotid stenosis is based on velocity parameters that correlate the residual internal carotid diameter with NASCET-based stenosis levels, using the diameter of the distal internal carotid lumen as the denominator for stenosis measurement. The following velocity measurements were obtained: RIGHT ICA: 90/26 cm/sec CCA: 96/78 cm/sec SYSTOLIC ICA/CCA RATIO:  1.2 ECA:  62 cm/sec LEFT ICA: 76/28 cm/sec CCA: 93/81 cm/sec SYSTOLIC ICA/CCA RATIO:  0.17 ECA:  75 cm/sec RIGHT CAROTID ARTERY: No significant atherosclerotic plaque or evidence of stenosis. RIGHT VERTEBRAL ARTERY:  Patent with normal antegrade flow. LEFT CAROTID ARTERY: No significant atherosclerotic plaque or evidence of stenosis. The mid internal carotid artery is highly tortuous. LEFT VERTEBRAL ARTERY:  Patent with normal antegrade flow. IMPRESSION: 1. No evidence of significant atherosclerotic plaque or internal carotid artery stenosis. 2. Vertebral arteries are patent with antegrade flow. Electronically Signed   By: Jacqulynn Cadet M.D.   On: 11/06/2017 09:24     Microbiology: No results found for this or any previous visit (from the past 240 hour(s)).    Labs: Basic Metabolic Panel: Recent Labs  Lab 11/05/17 1447 11/06/17 0433  NA 140 143  K 3.3* 3.3*  CL 104 108  CO2 23 26  GLUCOSE 175* 193*  BUN 31* 29*  CREATININE 2.00* 1.80*  CALCIUM 8.9 8.5*  MG  --  1.2*  PHOS  --  2.9   Liver Function Tests: Recent Labs  Lab 11/05/17 1447  AST 25  ALT 19  ALKPHOS 66  BILITOT 0.6  PROT 7.0  ALBUMIN 3.3*   No results for input(s): LIPASE, AMYLASE in the last 168 hours. No results for input(s): AMMONIA in the last 168 hours. CBC: Recent Labs  Lab 11/05/17 1447 11/06/17 0433  WBC 10.8* 7.2  NEUTROABS 6.5  --   HGB 10.9* 9.5*  HCT 34.2* 30.4*  MCV 87.5 87.9  PLT 245 213   Cardiac Enzymes: No results for input(s): CKTOTAL, CKMB, CKMBINDEX, TROPONINI in the last 168 hours. BNP: Invalid input(s): POCBNP CBG: Recent Labs  Lab 11/06/17 0802 11/06/17 1118 11/06/17 1705  GLUCAP 92 240* 97    Time coordinating discharge:  36 minutes  Signed:  Orson Eva, DO [facility] Pager: [phone] 11/06/2017, 6:01 PM

## 2017-11-06 NOTE — Care Management Obs Status (Signed)
[facility] NOTIFICATION   Patient Details  Name: Chelsea Meza MRN: [id number] Date of Birth: 02/06/1936   Medicare Observation Status Notification Given:  Yes    Azie Mcconahy, [place], RN 11/06/2017, 11:54 AM

## 2017-11-06 NOTE — Progress Notes (Signed)
SLP Cancellation Note  Patient Details Name: Chelsea Meza MRN: [id number] DOB: [birth year]   Cancelled treatment:       Reason Eval/Treat Not Completed: SLP screened, no needs identified, will sign off. SLP screened Pt in room. Pt denies any changes in swallowing, speech, language, or cognition. MRI negative for acute changes. SLE will be deferred at this time. Reconsult if indicated. SLP will sign off.   Thank you,  Genene Churn, [place]  [place] 11/06/2017, 4:44 PM

## 2017-11-06 NOTE — Progress Notes (Signed)
OT Cancellation Note  Patient Details Name: Chelsea Meza MRN: [id number] DOB: March 28, 1936   Cancelled Treatment:    Reason Eval/Treat Not Completed: OT screened, no needs identified, will sign off. Pt performing ADLs with mod independence this am, supervision with functional mobility including managing IV pole. Pt reports vision is continually improving, is able to read signs around room, vision assessment is WNL. No further OT services required at this time.   Guadelupe Sabin, OTR/L  [phone] 11/06/2017, 7:38 AM

## 2017-11-06 NOTE — Procedures (Signed)
  Sanders [name], MD     [email or web address]           HISTORY: The patient is an 82 year old who presents with the acute onset of visual impairment.  The study is being done to evaluate for seizures as the etiology.  MEDICATIONS: Scheduled Meds: . aspirin EC  81 mg Oral Daily  . atorvastatin  40 mg Oral Daily  . enoxaparin (LOVENOX) injection  30 mg Subcutaneous Q24H  . hydrALAZINE  50 mg Oral Q8H  . insulin aspart  0-9 Units Subcutaneous TID WC  . verapamil  120 mg Oral TID   Continuous Infusions: . sodium chloride Stopped (11/06/17 0222)   PRN Meds:.acetaminophen **OR** acetaminophen (TYLENOL) oral liquid 160 mg/5 mL **OR** acetaminophen, hydrALAZINE, ondansetron **OR** ondansetron (ZOFRAN) IV  Prior to Admission medications   Medication Sig Start Date End Date Taking? Authorizing Provider  aspirin EC 81 MG tablet Take 81-325 mg by mouth daily.    Yes [provider]  atorvastatin (LIPITOR) 40 MG tablet Take 1 tablet (40 mg total) by mouth daily. 03/23/15  Yes Kathie Dike, MD  glimepiride (AMARYL) 4 MG tablet Take 2 tablets (8 mg total) by mouth daily with breakfast. Patient taking differently: Take 6 mg by mouth daily with breakfast.  03/23/15  Yes Kathie Dike, MD  hydrALAZINE (APRESOLINE) 50 MG tablet Take 1 tablet (50 mg total) by mouth 3 (three) times daily. Patient taking differently: Take 100 mg by mouth 3 (three) times daily.  03/23/15  Yes Kathie Dike, MD  sitaGLIPtin-metformin (JANUMET) 50-1000 MG tablet Take 1 tablet by mouth 2 (two) times daily with a meal. Patient taking differently: Take 1 tablet by mouth daily.  03/23/15  Yes Kathie Dike, MD  verapamil (CALAN) 80 MG tablet Take 1 tablet (80 mg total) by mouth 3 (three) times daily. 03/23/15  Yes Kathie Dike, MD      ANALYSIS: A 16 channel recording using standard 10 20 measurements is conducted for 21 minutes.  There is a well-formed posterior dominant  rhythm of 10 hertz which attenuates with eye opening.  There is beta activity observed in the frontal areas.  Awake and drowsy activities are observed.  Photic stimulation is carried out without abnormal changes in the background activity.  There is no focal or lateralized slowing.  There is no epileptiform activity is observed.    IMPRESSION: 1.   This is a normal recording of awake and drowsy states.      Eternity Dexter [name], M.D.  Diplomate, Tax adviser of Psychiatry and Neurology ( Neurology).

## 2017-11-06 NOTE — Progress Notes (Signed)
*  PRELIMINARY RESULTS* Echocardiogram 2D Echocardiogram has been performed.  Chelsea Meza 11/06/2017, 11:50 AM

## 2017-11-06 NOTE — Progress Notes (Signed)
EEG Completed; Results Pending  

## 2017-11-06 NOTE — Progress Notes (Signed)
Asked to review EKG by primary team, shows PSVT. Patient is asymptomatic. Would keep her K at 4 and Mg at 2. TSH normal. Room to titrate her home verapmil, agree with increase to 120mg  tid. No further cardiology recs at this time, if persistent episodes call us back   Zandra Abts MD

## 2017-11-07 DIAGNOSIS — G459 Transient cerebral ischemic attack, unspecified: Secondary | ICD-10-CM

## 2017-11-07 DIAGNOSIS — H547 Unspecified visual loss: Secondary | ICD-10-CM | POA: Diagnosis not present

## 2017-11-07 LAB — BASIC METABOLIC PANEL
Anion gap: 8 (ref 5–15)
BUN: 27 mg/dL — ABNORMAL HIGH (ref 8–23)
CO2: 25 mmol/L (ref 22–32)
Calcium: 8.8 mg/dL — ABNORMAL LOW (ref 8.9–10.3)
Chloride: 108 mmol/L (ref 98–111)
Creatinine, Ser: 1.51 mg/dL — ABNORMAL HIGH (ref 0.44–1.00)
GFR calc Af Amer: 36 mL/min — ABNORMAL LOW (ref >60)
GFR calc non Af Amer: 31 mL/min — ABNORMAL LOW (ref >60)
Glucose, Bld: 123 mg/dL — ABNORMAL HIGH (ref 70–99)
Potassium: 3.5 mmol/L (ref 3.5–5.1)
Sodium: 141 mmol/L (ref 135–145)

## 2017-11-07 LAB — GLUCOSE, CAPILLARY
Glucose-Capillary: 132 mg/dL — ABNORMAL HIGH (ref 70–99)
Glucose-Capillary: 211 mg/dL — ABNORMAL HIGH (ref 70–99)

## 2017-11-07 LAB — HEMOGLOBIN A1C
Hgb A1c MFr Bld: 6.8 % — ABNORMAL HIGH (ref 4.8–5.6)
Mean Plasma Glucose: 148 mg/dL

## 2017-11-07 LAB — MAGNESIUM: Magnesium: 1.9 mg/dL (ref 1.7–2.4)

## 2017-11-07 MED ORDER — ENOXAPARIN SODIUM 40 MG/0.4ML ~~LOC~~ SOLN: 40.0000 mg | SUBCUTANEOUS | Status: DC

## 2017-11-07 NOTE — Progress Notes (Signed)
Physical Therapy Treatment Patient Details Name: Chelsea Meza MRN: [id number] DOB: 1935-10-15 Today's Date: 11/07/2017    History of Present Illness Chelsea Meza is a 82 y.o. female with medical history significant of chronic kidney disease, type 2 diabetes, hyperlipidemia, hypertension who is coming to the emergency department due to blurred vision around 1000 in the morning, which lasted for about an hour.  She she had a mild frontal headache, but denies focal weakness or numbness, dizziness, slurred speech or language comprehension difficulty.  There was no gait instability.  However, she complained of having mild nausea in the morning, which was follow later with 3 loose stools BMs before noon time.  She denies abdominal pain, emesis, further loose stools since then, melena or hematochezia.  No fever, chills, sore throat, dyspnea, wheezing, chest pain, palpitations, PND, orthopnea or recent pitting edema of the lower extremities.  Denies dysuria, frequency or hematuria.  No heat or cold intolerance.  No polyuria, polydipsia or polyphagia.  Denies of skin pruritus or rashes.    PT Comments    Patient demonstrates improvement in balance using cane for gait training, able to ambulate in hallways without loss of balance and did not have to lean on nearby objects for support using cane.  Patient encouraged to use her cane at home after discharge.  Plan:  Patient discharged from physical therapy to care of nursing for ambulation daily as tolerated for length of stay.    Follow Up Recommendations  No PT follow up     Equipment Recommendations  None recommended by PT    Recommendations for Other Services       Precautions / Restrictions Precautions Precautions: None Restrictions Weight Bearing Restrictions: No    Mobility  Bed Mobility Overal bed mobility: Independent                Transfers Overall transfer level: Modified independent Equipment used: None                 Ambulation/Gait Ambulation/Gait assistance: Modified independent (Device/Increase time) Gait Distance (Feet): 100 Feet Assistive device: None;Straight cane Gait Pattern/deviations: Decreased step length - right;Decreased step length - left;Decreased stride length Gait velocity: decreased   General Gait Details: slightly labored slow cadence with tendency to lean on siderail, counter tops for support, had patient use single point cane (SPC) and demonstrates improvement in balance and able to ambulate with 2 point gait pattern without scissoring of legs   Stairs             Wheelchair Mobility    Modified Rankin (Stroke Patients Only)       Balance Overall balance assessment: Mild deficits observed, not formally tested                                          Cognition Arousal/Alertness: Awake/alert Behavior During Therapy: WFL for tasks assessed/performed Overall Cognitive Status: Within Functional Limits for tasks assessed                                        Exercises      General Comments        Pertinent Vitals/Pain Pain Assessment: No/denies pain    Home Living  Prior Function            PT Goals (current goals can now be found in the care plan section) Acute Rehab PT Goals Patient Stated Goal: return home PT Goal Formulation: With patient Time For Goal Achievement: 11-19-2017 Potential to Achieve Goals: Good Progress towards PT goals: Progressing toward goals    Frequency           PT Plan Other (comment)(Patient to be discharged to care of nursing for ambulation daily for length of stay)    Co-evaluation              AM-PAC PT "6 Clicks" Daily Activity  Outcome Measure  Difficulty turning over in bed (including adjusting bedclothes, sheets and blankets)?: None Difficulty moving from lying on back to sitting on the side of the bed? : None Difficulty  sitting down on and standing up from a chair with arms (e.g., wheelchair, bedside commode, etc,.)?: None Help needed moving to and from a bed to chair (including a wheelchair)?: None Help needed walking in hospital room?: None Help needed climbing 3-5 steps with a railing? : None 6 Click Score: 24    End of Session   Activity Tolerance: Patient tolerated treatment well Patient left: in chair;with call bell/phone within reach Nurse Communication: Mobility status PT Visit Diagnosis: Unsteadiness on feet (R26.81);Other abnormalities of gait and mobility (R26.89);Muscle weakness (generalized) (M62.81)     Time: 8916-9450 PT Time Calculation (min) (ACUTE ONLY): 20 min  Charges:  $Gait Training: 8-22 mins                    G Codes:       8:55 AM, 2017/11/19 [name], MPT Physical Therapist with [facility] [phone] office [phone] mobile phone

## 2017-11-07 NOTE — Progress Notes (Signed)
Patient discharged home today per MD orders. Patient vital signs WDL. IV removed and site WDL. Discharge Instructions including follow up appointments, medications, and education reviewed with patient. Patient verbalizes understanding. Patient is transported out via wheelchair.  

## 2017-11-07 NOTE — Progress Notes (Signed)
Patient seen and examined, database reviewed.  Discussed with patient and friend at bedside.  She is ready for discharge home today, work-up is complete.  No changes to discharge summary dictated by Dr. Carles Collet on 7/9.  Domingo Mend, MD [facility] Pager: [phone]

## 2018-01-21 DIAGNOSIS — Z0000 Encounter for general adult medical examination without abnormal findings: Secondary | ICD-10-CM | POA: Diagnosis not present

## 2018-01-21 DIAGNOSIS — N183 Chronic kidney disease, stage 3 (moderate): Secondary | ICD-10-CM | POA: Diagnosis not present

## 2018-01-21 DIAGNOSIS — I10 Essential (primary) hypertension: Secondary | ICD-10-CM | POA: Diagnosis not present

## 2018-01-21 DIAGNOSIS — E785 Hyperlipidemia, unspecified: Secondary | ICD-10-CM | POA: Diagnosis not present

## 2018-01-21 DIAGNOSIS — E1122 Type 2 diabetes mellitus with diabetic chronic kidney disease: Secondary | ICD-10-CM | POA: Diagnosis not present

## 2018-01-21 DIAGNOSIS — E118 Type 2 diabetes mellitus with unspecified complications: Secondary | ICD-10-CM | POA: Diagnosis not present

## 2018-04-12 ENCOUNTER — Other Ambulatory Visit: Payer: Self-pay

## 2018-04-12 NOTE — Patient Outreach (Signed)
[facility]) Care Management  04/12/2018  Chelsea Meza Apr 19, 1936 [id number]   Medication Adherence call to Chelsea Meza spoke with patient's daughter she said Chelsea Meza is no longer taking Glimepire 4 mg doctor took her off. Chelsea Meza is showing past due under [place].   [place] Management Direct Dial [phone]  Fax [phone] [email or web address]

## 2018-07-03 DIAGNOSIS — H524 Presbyopia: Secondary | ICD-10-CM | POA: Diagnosis not present

## 2018-07-03 DIAGNOSIS — H2513 Age-related nuclear cataract, bilateral: Secondary | ICD-10-CM | POA: Diagnosis not present

## 2018-07-08 DIAGNOSIS — E1165 Type 2 diabetes mellitus with hyperglycemia: Secondary | ICD-10-CM | POA: Diagnosis not present

## 2018-07-08 DIAGNOSIS — E785 Hyperlipidemia, unspecified: Secondary | ICD-10-CM | POA: Diagnosis not present

## 2018-07-08 DIAGNOSIS — I10 Essential (primary) hypertension: Secondary | ICD-10-CM | POA: Diagnosis not present

## 2018-07-08 DIAGNOSIS — N183 Chronic kidney disease, stage 3 (moderate): Secondary | ICD-10-CM | POA: Diagnosis not present

## 2018-07-16 DIAGNOSIS — H2512 Age-related nuclear cataract, left eye: Secondary | ICD-10-CM | POA: Diagnosis not present

## 2018-09-11 DIAGNOSIS — E785 Hyperlipidemia, unspecified: Secondary | ICD-10-CM | POA: Diagnosis not present

## 2018-09-11 DIAGNOSIS — I10 Essential (primary) hypertension: Secondary | ICD-10-CM | POA: Diagnosis not present

## 2018-09-11 DIAGNOSIS — H2512 Age-related nuclear cataract, left eye: Secondary | ICD-10-CM | POA: Diagnosis not present

## 2018-09-11 DIAGNOSIS — E119 Type 2 diabetes mellitus without complications: Secondary | ICD-10-CM | POA: Diagnosis not present

## 2018-09-16 DIAGNOSIS — E1136 Type 2 diabetes mellitus with diabetic cataract: Secondary | ICD-10-CM | POA: Diagnosis not present

## 2018-09-16 DIAGNOSIS — I10 Essential (primary) hypertension: Secondary | ICD-10-CM | POA: Diagnosis not present

## 2018-09-16 DIAGNOSIS — H2512 Age-related nuclear cataract, left eye: Secondary | ICD-10-CM | POA: Diagnosis not present

## 2018-09-16 DIAGNOSIS — E785 Hyperlipidemia, unspecified: Secondary | ICD-10-CM | POA: Diagnosis not present

## 2018-09-16 DIAGNOSIS — H259 Unspecified age-related cataract: Secondary | ICD-10-CM | POA: Diagnosis not present

## 2018-09-16 DIAGNOSIS — E119 Type 2 diabetes mellitus without complications: Secondary | ICD-10-CM | POA: Diagnosis not present

## 2018-09-18 ENCOUNTER — Other Ambulatory Visit: Payer: Self-pay

## 2018-09-18 NOTE — Patient Outreach (Signed)
[facility]) Care Management  09/18/2018  Chelsea Meza 1935/06/22 [id number]   Medication Adherence call to Chelsea Meza Compliant Voice message left with a call back number. Chelsea Meza is showing past due on Atorvastatin 40 mg under [place].   [place] Management Direct Dial [phone]  Fax [phone] [email or web address]

## 2018-10-17 ENCOUNTER — Inpatient Hospital Stay (HOSPITAL_COMMUNITY)
Admission: EM | Admit: 2018-10-17 | Discharge: 2018-10-22 | DRG: 638 | Disposition: A | Discharge status: Home / Self Care | Payer: Medicare Other | Attending: Internal Medicine | Admitting: Internal Medicine

## 2018-10-17 ENCOUNTER — Other Ambulatory Visit: Payer: Self-pay

## 2018-10-17 ENCOUNTER — Encounter (HOSPITAL_COMMUNITY): Payer: Self-pay

## 2018-10-17 DIAGNOSIS — Z7982 Long term (current) use of aspirin: Secondary | ICD-10-CM | POA: Diagnosis not present

## 2018-10-17 DIAGNOSIS — E869 Volume depletion, unspecified: Secondary | ICD-10-CM | POA: Diagnosis present

## 2018-10-17 DIAGNOSIS — I248 Other forms of acute ischemic heart disease: Secondary | ICD-10-CM | POA: Diagnosis present

## 2018-10-17 DIAGNOSIS — E871 Hypo-osmolality and hyponatremia: Secondary | ICD-10-CM | POA: Diagnosis not present

## 2018-10-17 DIAGNOSIS — Z1159 Encounter for screening for other viral diseases: Secondary | ICD-10-CM | POA: Diagnosis not present

## 2018-10-17 DIAGNOSIS — R739 Hyperglycemia, unspecified: Secondary | ICD-10-CM | POA: Diagnosis present

## 2018-10-17 DIAGNOSIS — E1122 Type 2 diabetes mellitus with diabetic chronic kidney disease: Secondary | ICD-10-CM | POA: Diagnosis not present

## 2018-10-17 DIAGNOSIS — E1110 Type 2 diabetes mellitus with ketoacidosis without coma: Secondary | ICD-10-CM | POA: Diagnosis not present

## 2018-10-17 DIAGNOSIS — N183 Chronic kidney disease, stage 3 (moderate): Secondary | ICD-10-CM | POA: Diagnosis not present

## 2018-10-17 DIAGNOSIS — E876 Hypokalemia: Secondary | ICD-10-CM | POA: Diagnosis present

## 2018-10-17 DIAGNOSIS — Z8673 Personal history of transient ischemic attack (TIA), and cerebral infarction without residual deficits: Secondary | ICD-10-CM | POA: Diagnosis not present

## 2018-10-17 DIAGNOSIS — Z823 Family history of stroke: Secondary | ICD-10-CM | POA: Diagnosis not present

## 2018-10-17 DIAGNOSIS — I129 Hypertensive chronic kidney disease with stage 1 through stage 4 chronic kidney disease, or unspecified chronic kidney disease: Secondary | ICD-10-CM | POA: Diagnosis present

## 2018-10-17 DIAGNOSIS — N179 Acute kidney failure, unspecified: Secondary | ICD-10-CM | POA: Diagnosis present

## 2018-10-17 DIAGNOSIS — Y92009 Unspecified place in unspecified non-institutional (private) residence as the place of occurrence of the external cause: Secondary | ICD-10-CM

## 2018-10-17 DIAGNOSIS — T383X6A Underdosing of insulin and oral hypoglycemic [antidiabetic] drugs, initial encounter: Secondary | ICD-10-CM | POA: Diagnosis not present

## 2018-10-17 DIAGNOSIS — I10 Essential (primary) hypertension: Secondary | ICD-10-CM | POA: Diagnosis not present

## 2018-10-17 DIAGNOSIS — I471 Supraventricular tachycardia: Secondary | ICD-10-CM | POA: Diagnosis not present

## 2018-10-17 DIAGNOSIS — E1165 Type 2 diabetes mellitus with hyperglycemia: Secondary | ICD-10-CM | POA: Diagnosis present

## 2018-10-17 DIAGNOSIS — I440 Atrioventricular block, first degree: Secondary | ICD-10-CM | POA: Diagnosis not present

## 2018-10-17 DIAGNOSIS — E785 Hyperlipidemia, unspecified: Secondary | ICD-10-CM | POA: Diagnosis not present

## 2018-10-17 DIAGNOSIS — I4430 Unspecified atrioventricular block: Secondary | ICD-10-CM | POA: Diagnosis not present

## 2018-10-17 DIAGNOSIS — E1100 Type 2 diabetes mellitus with hyperosmolarity without nonketotic hyperglycemic-hyperosmolar coma (NKHHC): Principal | ICD-10-CM | POA: Diagnosis present

## 2018-10-17 DIAGNOSIS — I358 Other nonrheumatic aortic valve disorders: Secondary | ICD-10-CM | POA: Diagnosis not present

## 2018-10-17 DIAGNOSIS — I509 Heart failure, unspecified: Secondary | ICD-10-CM

## 2018-10-17 HISTORY — DX: Transient cerebral ischemic attack, unspecified: G45.9

## 2018-10-17 HISTORY — DX: Anemia, unspecified: D64.9

## 2018-10-17 LAB — CBC WITH DIFFERENTIAL/PLATELET
Abs Immature Granulocytes: 0.03 10*3/uL (ref 0.00–0.07)
Basophils Absolute: 0.1 10*3/uL (ref 0.0–0.1)
Basophils Relative: 1 %
Eosinophils Absolute: 0.1 10*3/uL (ref 0.0–0.5)
Eosinophils Relative: 1 %
HCT: 37.9 % (ref 36.0–46.0)
Hemoglobin: 11.9 g/dL — ABNORMAL LOW (ref 12.0–15.0)
Immature Granulocytes: 0 %
Lymphocytes Relative: 23 %
Lymphs Abs: 2.2 10*3/uL (ref 0.7–4.0)
MCH: 27.7 pg (ref 26.0–34.0)
MCHC: 31.4 g/dL (ref 30.0–36.0)
MCV: 88.3 fL (ref 80.0–100.0)
Monocytes Absolute: 0.6 10*3/uL (ref 0.1–1.0)
Monocytes Relative: 6 %
Neutro Abs: 6.7 10*3/uL (ref 1.7–7.7)
Neutrophils Relative %: 69 %
Platelets: 180 10*3/uL (ref 150–400)
RBC: 4.29 MIL/uL (ref 3.87–5.11)
RDW: 13.2 % (ref 11.5–15.5)
WBC: 9.5 10*3/uL (ref 4.0–10.5)
nRBC: 0 % (ref 0.0–0.2)

## 2018-10-17 LAB — BLOOD GAS, VENOUS
Acid-base deficit: 1.3 mmol/L (ref 0.0–2.0)
Bicarbonate: 23.2 mmol/L (ref 20.0–28.0)
FIO2: 99
O2 Saturation: 90.6 %
Patient temperature: 37
pCO2, Ven: 39.5 mmHg — ABNORMAL LOW (ref 44.0–60.0)
pH, Ven: 7.384 (ref 7.250–7.430)
pO2, Ven: 59.2 mmHg — ABNORMAL HIGH (ref 32.0–45.0)

## 2018-10-17 LAB — CBG MONITORING, ED: Glucose-Capillary: >600 mg/dL (ref 70–99)

## 2018-10-17 LAB — LACTIC ACID, PLASMA: Lactic Acid, Venous: 1.5 mmol/L (ref 0.5–1.9)

## 2018-10-17 MED ORDER — SODIUM CHLORIDE 0.9 % IV BOLUS: 1000.0000 mL | Freq: Once | INTRAVENOUS | Status: AC

## 2018-10-17 NOTE — ED Provider Notes (Signed)
[facility] EMERGENCY DEPARTMENT Provider Note   CSN: [id number] Arrival date & time: 10/17/18  2240     History   Chief Complaint Chief Complaint  Patient presents with  . Hyperglycemia    HPI Chelsea Meza is a 83 y.o. female.     Patient with history of diabetes and hypertension presenting with elevated blood sugar from home.  Patient states she has not had her diabetes medicine which included Janumet and glipizide for the past 2 weeks.  Contrary to triage note she does not take insulin.  Sugar has been high for the past day patient's daughter called EMS.  Patient states is been lying around today and not having much energy.  She denies any fevers, chills, nausea, vomiting.  No chest pain or shortness of breath.  No focal weakness, numbness or tingling.  No urinary symptoms.  No cough.  Patient reports that there was some confusion with her prescriptions and she was told she could not get refills until July.  The history is provided by the patient.  Hyperglycemia Associated symptoms: fatigue and weakness   Associated symptoms: no abdominal pain, no chest pain, no dizziness, no dysuria, no fever, no nausea, no shortness of breath and no vomiting     Past Medical History:  Diagnosis Date  . CKD (chronic kidney disease) stage 3, GFR 30-59 ml/min ([name])   . Diabetes mellitus without complication ([place])   . Hyperlipidemia   . Hypertension     Patient Active Problem List   Diagnosis Date Noted  . TIA (transient ischemic attack) 11/05/2017  . Hypokalemia 11/05/2017  . Hyponatremia 03/20/2015  . AKI (acute kidney injury) ([place]) 03/20/2015  . CKD (chronic kidney disease) stage 3, GFR 30-59 ml/min ([name]) 03/20/2015  . Hyperglycemia 03/20/2015  . HLD (hyperlipidemia) 03/20/2015  . Diabetes mellitus due to underlying condition with diabetic nephropathy ([place]) 03/20/2015  . Diabetic hyperosmolar non-ketotic state ([place]) 07/14/2012  . Lactic acidosis 07/14/2012  . DKA, type 2 ([place])  07/13/2012  . Acute renal failure ([place]) 07/13/2012  . PSVT (paroxysmal supraventricular tachycardia) ([place]) 07/13/2012  . Weakness of both legs 07/13/2012  . Paresthesia of left leg 07/13/2012  . Cough 07/13/2012  . Essential hypertension, benign 07/13/2012    Past Surgical History:  Procedure Laterality Date  . CHOLECYSTECTOMY       OB History   No obstetric history on file.      Home Medications    Prior to Admission medications   Medication Sig Start Date End Date Taking? Authorizing Provider  aspirin EC 81 MG tablet Take 81-325 mg by mouth daily.     [provider]  atorvastatin (LIPITOR) 80 MG tablet Take 1 tablet (80 mg total) by mouth daily. 11/07/17   Orson Eva, MD  glimepiride (AMARYL) 4 MG tablet Take 2 tablets (8 mg total) by mouth daily with breakfast. Patient taking differently: Take 6 mg by mouth daily with breakfast.  03/23/15   Kathie Dike, MD  hydrALAZINE (APRESOLINE) 50 MG tablet Take 1 tablet (50 mg total) by mouth 3 (three) times daily. Patient taking differently: Take 100 mg by mouth 3 (three) times daily.  03/23/15   Kathie Dike, MD  sitaGLIPtin-metformin (JANUMET) 50-1000 MG tablet Take 1 tablet by mouth 2 (two) times daily with a meal. Patient taking differently: Take 1 tablet by mouth daily.  03/23/15   Kathie Dike, MD  verapamil (CALAN) 120 MG tablet Take 1 tablet (120 mg total) by mouth 3 (three) times daily. 11/06/17  Orson Eva, MD    Family History Family History  Problem Relation Age of Onset  . Stroke Mother   . Stroke Maternal Grandmother     Social History Social History   Tobacco Use  . Smoking status: Never Smoker  . Smokeless tobacco: Never Used  Substance Use Topics  . Alcohol use: No  . Drug use: No     Allergies   Patient has no known allergies.   Review of Systems Review of Systems  Constitutional: Positive for fatigue. Negative for activity change, appetite change and fever.  HENT: Negative for  congestion and rhinorrhea.   Respiratory: Negative for cough, chest tightness and shortness of breath.   Cardiovascular: Negative for chest pain.  Gastrointestinal: Negative for abdominal pain, nausea and vomiting.  Genitourinary: Negative for dysuria and hematuria.  Musculoskeletal: Negative for arthralgias, back pain and myalgias.  Skin: Negative for rash.  Neurological: Positive for weakness. Negative for dizziness, light-headedness and headaches.   all other systems are negative except as noted in the HPI and PMH.     Physical Exam Updated Vital Signs BP (!) 198/95   Pulse 69   Temp 98.2 F (36.8 C) (Oral)   Resp 20   SpO2 98%   Physical Exam Vitals signs and nursing note reviewed.  Constitutional:      General: She is not in acute distress.    Appearance: She is well-developed. She is obese.  HENT:     Head: Normocephalic and atraumatic.     Mouth/Throat:     Mouth: Mucous membranes are dry.     Pharynx: No oropharyngeal exudate.  Eyes:     Conjunctiva/sclera: Conjunctivae normal.     Pupils: Pupils are equal, round, and reactive to light.  Neck:     Musculoskeletal: Normal range of motion and neck supple.     Comments: No meningismus. Cardiovascular:     Rate and Rhythm: Normal rate and regular rhythm.     Heart sounds: Normal heart sounds. No murmur.  Pulmonary:     Effort: Pulmonary effort is normal. No respiratory distress.     Breath sounds: Normal breath sounds.  Abdominal:     Palpations: Abdomen is soft.     Tenderness: There is no abdominal tenderness. There is no guarding or rebound.  Musculoskeletal: Normal range of motion.        General: No tenderness.  Skin:    General: Skin is warm.     Capillary Refill: Capillary refill takes less than 2 seconds.  Neurological:     General: No focal deficit present.     Mental Status: She is alert and oriented to person, place, and time. Mental status is at baseline.     Cranial Nerves: No cranial nerve  deficit.     Motor: No abnormal muscle tone.     Coordination: Coordination normal.     Comments: No ataxia on finger to nose bilaterally. No pronator drift. 5/5 strength throughout. CN 2-12 intact.Equal grip strength. Sensation intact.   Psychiatric:        Behavior: Behavior normal.      ED Treatments / Results  Labs (all labs ordered are listed, but only abnormal results are displayed) Labs Reviewed  CBC WITH DIFFERENTIAL/PLATELET - Abnormal; Notable for the following components:      Result Value   Hemoglobin 11.9 (*)    All other components within normal limits  COMPREHENSIVE METABOLIC PANEL - Abnormal; Notable for the following components:   Sodium 124 (*)  Chloride 91 (*)    CO2 21 (*)    Glucose, Bld 885 (*)    BUN 56 (*)    Creatinine, Ser 2.45 (*)    GFR calc non Af Amer 18 (*)    GFR calc Af Amer 21 (*)    All other components within normal limits  URINALYSIS, ROUTINE W REFLEX MICROSCOPIC - Abnormal; Notable for the following components:   Color, Urine STRAW (*)    Glucose, UA >=500 (*)    All other components within normal limits  BLOOD GAS, VENOUS - Abnormal; Notable for the following components:   pCO2, Ven 39.5 (*)    pO2, Ven 59.2 (*)    All other components within normal limits  CBG MONITORING, ED - Abnormal; Notable for the following components:   Glucose-Capillary >600 (*)    All other components within normal limits  CBG MONITORING, ED - Abnormal; Notable for the following components:   Glucose-Capillary >600 (*)    All other components within normal limits  CBG MONITORING, ED - Abnormal; Notable for the following components:   Glucose-Capillary 434 (*)    All other components within normal limits  NOVEL CORONAVIRUS, NAA (HOSPITAL ORDER, SEND-OUT TO REF LAB)  MRSA PCR SCREENING  LACTIC ACID, PLASMA  COMPREHENSIVE METABOLIC PANEL  CBC    EKG None  Radiology No results found.  Procedures Procedures (including critical care  time)  Medications Ordered in ED Medications  sodium chloride 0.9 % bolus 1,000 mL (1,000 mLs Intravenous Given by EMS 10/17/18 2306)     Initial Impression / Assessment and Plan / ED Course  I have reviewed the triage vital signs and the nursing notes.  Pertinent labs & imaging results that were available during my care of the patient were reviewed by me and considered in my medical decision making (see chart for details).       Diabetic out of medications for the past 2 weeks.  No fever or infectious symptoms.  Patient found to have glucose of 885.  Hyponatremia 124, AKI 2.5.  pH is normal and anion gap is normal.  Patient started on IV fluids and IV insulin.  Patient with hyperosmolar nonketotic state and hyperglycemia.  She is given IV fluids and IV insulin.  No evidence of infection.  Labs with AKI as well as hyponatremia likely secondary to same.  Patient will be treated with IV insulin overnight and given hydration.  Discussed with Dr. Maudie Mercury.  CRITICAL CARE Performed by: Ezequiel Essex Total critical care time: 35 minutes Critical care time was exclusive of separately billable procedures and treating other patients. Critical care was necessary to treat or prevent imminent or life-threatening deterioration. Critical care was time spent personally by me on the following activities: development of treatment plan with patient and/or surrogate as well as nursing, discussions with consultants, evaluation of patient's response to treatment, examination of patient, obtaining history from patient or surrogate, ordering and performing treatments and interventions, ordering and review of laboratory studies, ordering and review of radiographic studies, pulse oximetry and re-evaluation of patient's condition.  Final Clinical Impressions(s) / ED Diagnoses   Final diagnoses:  Hyperglycemia    ED Discharge Orders    None       Jan Walters, Annie Main, MD 10/18/18 [phone]

## 2018-10-17 NOTE — ED Triage Notes (Signed)
Pt in by [facility] for high blood sugar.  Pt reports she ran out of her insulin yesterday.  Pt ran "High" for blood sugar per ems.  Pt denies complaints.

## 2018-10-18 ENCOUNTER — Other Ambulatory Visit: Payer: Self-pay

## 2018-10-18 ENCOUNTER — Encounter (HOSPITAL_COMMUNITY): Payer: Self-pay | Admitting: Internal Medicine

## 2018-10-18 DIAGNOSIS — I10 Essential (primary) hypertension: Secondary | ICD-10-CM | POA: Diagnosis not present

## 2018-10-18 DIAGNOSIS — E785 Hyperlipidemia, unspecified: Secondary | ICD-10-CM | POA: Diagnosis present

## 2018-10-18 DIAGNOSIS — E869 Volume depletion, unspecified: Secondary | ICD-10-CM | POA: Diagnosis present

## 2018-10-18 DIAGNOSIS — I471 Supraventricular tachycardia: Secondary | ICD-10-CM | POA: Diagnosis not present

## 2018-10-18 DIAGNOSIS — T383X6A Underdosing of insulin and oral hypoglycemic [antidiabetic] drugs, initial encounter: Secondary | ICD-10-CM | POA: Diagnosis present

## 2018-10-18 DIAGNOSIS — Z823 Family history of stroke: Secondary | ICD-10-CM | POA: Diagnosis not present

## 2018-10-18 DIAGNOSIS — E876 Hypokalemia: Secondary | ICD-10-CM | POA: Diagnosis present

## 2018-10-18 DIAGNOSIS — R739 Hyperglycemia, unspecified: Secondary | ICD-10-CM | POA: Diagnosis not present

## 2018-10-18 DIAGNOSIS — Z7982 Long term (current) use of aspirin: Secondary | ICD-10-CM | POA: Diagnosis not present

## 2018-10-18 DIAGNOSIS — E1110 Type 2 diabetes mellitus with ketoacidosis without coma: Secondary | ICD-10-CM | POA: Diagnosis not present

## 2018-10-18 DIAGNOSIS — Z1159 Encounter for screening for other viral diseases: Secondary | ICD-10-CM | POA: Diagnosis not present

## 2018-10-18 DIAGNOSIS — I129 Hypertensive chronic kidney disease with stage 1 through stage 4 chronic kidney disease, or unspecified chronic kidney disease: Secondary | ICD-10-CM | POA: Diagnosis present

## 2018-10-18 DIAGNOSIS — E1100 Type 2 diabetes mellitus with hyperosmolarity without nonketotic hyperglycemic-hyperosmolar coma (NKHHC): Secondary | ICD-10-CM | POA: Diagnosis not present

## 2018-10-18 DIAGNOSIS — I440 Atrioventricular block, first degree: Secondary | ICD-10-CM | POA: Diagnosis present

## 2018-10-18 DIAGNOSIS — I248 Other forms of acute ischemic heart disease: Secondary | ICD-10-CM | POA: Diagnosis not present

## 2018-10-18 DIAGNOSIS — E1165 Type 2 diabetes mellitus with hyperglycemia: Secondary | ICD-10-CM | POA: Diagnosis present

## 2018-10-18 DIAGNOSIS — Z8673 Personal history of transient ischemic attack (TIA), and cerebral infarction without residual deficits: Secondary | ICD-10-CM | POA: Diagnosis not present

## 2018-10-18 DIAGNOSIS — E871 Hypo-osmolality and hyponatremia: Secondary | ICD-10-CM | POA: Diagnosis present

## 2018-10-18 DIAGNOSIS — N183 Chronic kidney disease, stage 3 (moderate): Secondary | ICD-10-CM | POA: Diagnosis not present

## 2018-10-18 DIAGNOSIS — I509 Heart failure, unspecified: Secondary | ICD-10-CM

## 2018-10-18 DIAGNOSIS — N179 Acute kidney failure, unspecified: Secondary | ICD-10-CM | POA: Diagnosis not present

## 2018-10-18 DIAGNOSIS — E1122 Type 2 diabetes mellitus with diabetic chronic kidney disease: Secondary | ICD-10-CM | POA: Diagnosis present

## 2018-10-18 DIAGNOSIS — I358 Other nonrheumatic aortic valve disorders: Secondary | ICD-10-CM | POA: Diagnosis not present

## 2018-10-18 DIAGNOSIS — Y92009 Unspecified place in unspecified non-institutional (private) residence as the place of occurrence of the external cause: Secondary | ICD-10-CM | POA: Diagnosis not present

## 2018-10-18 LAB — COMPREHENSIVE METABOLIC PANEL
ALT: 18 U/L (ref 0–44)
ALT: 16 U/L (ref 0–44)
AST: 15 U/L (ref 15–41)
AST: 15 U/L (ref 15–41)
Albumin: 3.8 g/dL (ref 3.5–5.0)
Albumin: 3.7 g/dL (ref 3.5–5.0)
Alkaline Phosphatase: 104 U/L (ref 38–126)
Alkaline Phosphatase: 94 U/L (ref 38–126)
Anion gap: 12 (ref 5–15)
Anion gap: 9 (ref 5–15)
BUN: 56 mg/dL — ABNORMAL HIGH (ref 8–23)
BUN: 52 mg/dL — ABNORMAL HIGH (ref 8–23)
CO2: 21 mmol/L — ABNORMAL LOW (ref 22–32)
CO2: 23 mmol/L (ref 22–32)
Calcium: 9.4 mg/dL (ref 8.9–10.3)
Calcium: 9.5 mg/dL (ref 8.9–10.3)
Chloride: 91 mmol/L — ABNORMAL LOW (ref 98–111)
Chloride: 104 mmol/L (ref 98–111)
Creatinine, Ser: 2.45 mg/dL — ABNORMAL HIGH (ref 0.44–1.00)
Creatinine, Ser: 2.21 mg/dL — ABNORMAL HIGH (ref 0.44–1.00)
GFR calc Af Amer: 21 mL/min — ABNORMAL LOW (ref >60)
GFR calc Af Amer: 23 mL/min — ABNORMAL LOW (ref >60)
GFR calc non Af Amer: 18 mL/min — ABNORMAL LOW (ref >60)
GFR calc non Af Amer: 20 mL/min — ABNORMAL LOW (ref >60)
Glucose, Bld: 885 mg/dL (ref 70–99)
Glucose, Bld: 245 mg/dL — ABNORMAL HIGH (ref 70–99)
Potassium: 4.8 mmol/L (ref 3.5–5.1)
Potassium: 3.4 mmol/L — ABNORMAL LOW (ref 3.5–5.1)
Sodium: 124 mmol/L — ABNORMAL LOW (ref 135–145)
Sodium: 136 mmol/L (ref 135–145)
Total Bilirubin: 0.9 mg/dL (ref 0.3–1.2)
Total Bilirubin: 0.7 mg/dL (ref 0.3–1.2)
Total Protein: 7.1 g/dL (ref 6.5–8.1)
Total Protein: 6.9 g/dL (ref 6.5–8.1)

## 2018-10-18 LAB — URINALYSIS, ROUTINE W REFLEX MICROSCOPIC
Bacteria, UA: NONE SEEN
Bilirubin Urine: NEGATIVE
Glucose, UA: >=500 mg/dL — AB
Hgb urine dipstick: NEGATIVE
Ketones, ur: NEGATIVE mg/dL
Leukocytes,Ua: NEGATIVE
Nitrite: NEGATIVE
Protein, ur: NEGATIVE mg/dL
Specific Gravity, Urine: 1.015 (ref 1.005–1.030)
pH: 5 (ref 5.0–8.0)

## 2018-10-18 LAB — CBC
HCT: 35.4 % — ABNORMAL LOW (ref 36.0–46.0)
Hemoglobin: 11.5 g/dL — ABNORMAL LOW (ref 12.0–15.0)
MCH: 27.8 pg (ref 26.0–34.0)
MCHC: 32.5 g/dL (ref 30.0–36.0)
MCV: 85.7 fL (ref 80.0–100.0)
Platelets: 191 10*3/uL (ref 150–400)
RBC: 4.13 MIL/uL (ref 3.87–5.11)
RDW: 12.7 % (ref 11.5–15.5)
WBC: 13.4 10*3/uL — ABNORMAL HIGH (ref 4.0–10.5)
nRBC: 0 % (ref 0.0–0.2)

## 2018-10-18 LAB — GLUCOSE, CAPILLARY
Glucose-Capillary: 171 mg/dL — ABNORMAL HIGH (ref 70–99)
Glucose-Capillary: 271 mg/dL — ABNORMAL HIGH (ref 70–99)
Glucose-Capillary: 324 mg/dL — ABNORMAL HIGH (ref 70–99)
Glucose-Capillary: 304 mg/dL — ABNORMAL HIGH (ref 70–99)

## 2018-10-18 LAB — TSH: TSH: 1.449 u[IU]/mL (ref 0.350–4.500)

## 2018-10-18 LAB — CBG MONITORING, ED
Glucose-Capillary: >600 mg/dL (ref 70–99)
Glucose-Capillary: 434 mg/dL — ABNORMAL HIGH (ref 70–99)

## 2018-10-18 LAB — MRSA PCR SCREENING: MRSA by PCR: NEGATIVE

## 2018-10-18 LAB — MAGNESIUM: Magnesium: 2.2 mg/dL (ref 1.7–2.4)

## 2018-10-18 LAB — TROPONIN I
Troponin I: 0.03 ng/mL (ref <0.03)
Troponin I: 0.03 ng/mL (ref <0.03)
Troponin I: 0.25 ng/mL (ref <0.03)

## 2018-10-18 MED ORDER — ADENOSINE 6 MG/2ML IV SOLN: 6.0000 mg | INTRAVENOUS | Status: DC

## 2018-10-18 MED ORDER — ACETAMINOPHEN 650 MG RE SUPP: 650.0000 mg | Freq: Four times a day (QID) | RECTAL | Status: DC | PRN

## 2018-10-18 MED ORDER — SODIUM CHLORIDE 0.9 % IV BOLUS
1000.0000 mL | Freq: Once | INTRAVENOUS | Status: AC
  Administered 2018-10-18: 1000 mL via INTRAVENOUS

## 2018-10-18 MED ORDER — HYDRALAZINE HCL 25 MG PO TABS
50.0000 mg | ORAL_TABLET | Freq: Three times a day (TID) | ORAL | Status: DC
  Administered 2018-10-18 (×3): 50 mg via ORAL
  Filled 2018-10-18 (×3): qty 2

## 2018-10-18 MED ORDER — ENOXAPARIN SODIUM 30 MG/0.3ML ~~LOC~~ SOLN
30.0000 mg | SUBCUTANEOUS | Status: DC
  Administered 2018-10-18 – 2018-10-22 (×5): 30 mg via SUBCUTANEOUS
  Filled 2018-10-18 (×5): qty 0.3

## 2018-10-18 MED ORDER — INSULIN REGULAR(HUMAN) IN NACL 100-0.9 UT/100ML-% IV SOLN
INTRAVENOUS | Status: DC
  Administered 2018-10-18: 5.4 [IU]/h via INTRAVENOUS
  Filled 2018-10-18: qty 100

## 2018-10-18 MED ORDER — INSULIN GLARGINE 100 UNIT/ML ~~LOC~~ SOLN
5.0000 [IU] | Freq: Every day | SUBCUTANEOUS | Status: DC
  Administered 2018-10-18: 5 [IU] via SUBCUTANEOUS
  Filled 2018-10-18 (×4): qty 0.05

## 2018-10-18 MED ORDER — ACETAMINOPHEN 325 MG PO TABS: 650.0000 mg | ORAL_TABLET | Freq: Four times a day (QID) | ORAL | Status: DC | PRN

## 2018-10-18 MED ORDER — VERAPAMIL HCL 80 MG PO TABS
120.0000 mg | ORAL_TABLET | Freq: Three times a day (TID) | ORAL | Status: DC
  Administered 2018-10-18: 120 mg via ORAL
  Filled 2018-10-18: qty 2

## 2018-10-18 MED ORDER — POTASSIUM CHLORIDE IN NACL 20-0.9 MEQ/L-% IV SOLN
INTRAVENOUS | Status: DC
  Administered 2018-10-18 – 2018-10-19 (×2): via INTRAVENOUS

## 2018-10-18 MED ORDER — VERAPAMIL HCL ER 240 MG PO TBCR
240.0000 mg | EXTENDED_RELEASE_TABLET | Freq: Every day | ORAL | Status: DC
  Administered 2018-10-18 – 2018-10-22 (×5): 240 mg via ORAL
  Filled 2018-10-18 (×5): qty 1

## 2018-10-18 MED ORDER — HYDRALAZINE HCL 25 MG PO TABS: 100.0000 mg | ORAL_TABLET | Freq: Three times a day (TID) | ORAL | Status: DC

## 2018-10-18 MED ORDER — ATORVASTATIN CALCIUM 40 MG PO TABS
80.0000 mg | ORAL_TABLET | Freq: Every day | ORAL | Status: DC
  Administered 2018-10-18 – 2018-10-22 (×5): 80 mg via ORAL
  Filled 2018-10-18 (×6): qty 2

## 2018-10-18 MED ORDER — DEXTROSE-NACL 5-0.45 % IV SOLN: INTRAVENOUS | Status: DC

## 2018-10-18 MED ORDER — DILTIAZEM HCL ER COATED BEADS 180 MG PO CP24: 180.0000 mg | ORAL_CAPSULE | Freq: Every day | ORAL | Status: DC

## 2018-10-18 MED ORDER — INSULIN ASPART 100 UNIT/ML ~~LOC~~ SOLN: 0.0000 [IU] | Freq: Every day | SUBCUTANEOUS | Status: DC

## 2018-10-18 MED ORDER — ADENOSINE 6 MG/2ML IV SOLN
INTRAVENOUS | Status: AC
  Filled 2018-10-18: qty 2

## 2018-10-18 MED ORDER — GLIMEPIRIDE 2 MG PO TABS: 8.0000 mg | ORAL_TABLET | Freq: Every day | ORAL | Status: DC

## 2018-10-18 MED ORDER — POTASSIUM CHLORIDE CRYS ER 20 MEQ PO TBCR
20.0000 meq | EXTENDED_RELEASE_TABLET | Freq: Once | ORAL | Status: AC
  Administered 2018-10-18: 20 meq via ORAL
  Filled 2018-10-18: qty 1

## 2018-10-18 MED ORDER — SODIUM CHLORIDE 0.9 % IV SOLN
INTRAVENOUS | Status: AC
  Administered 2018-10-18: 01:00:00 via INTRAVENOUS

## 2018-10-18 MED ORDER — ASPIRIN EC 81 MG PO TBEC
81.0000 mg | DELAYED_RELEASE_TABLET | Freq: Every day | ORAL | Status: DC
  Administered 2018-10-18 – 2018-10-22 (×5): 81 mg via ORAL
  Filled 2018-10-18 (×5): qty 1

## 2018-10-18 MED ORDER — INSULIN ASPART 100 UNIT/ML ~~LOC~~ SOLN
0.0000 [IU] | Freq: Three times a day (TID) | SUBCUTANEOUS | Status: DC
  Administered 2018-10-18: 5 [IU] via SUBCUTANEOUS
  Administered 2018-10-18 – 2018-10-19 (×3): 7 [IU] via SUBCUTANEOUS

## 2018-10-18 MED ORDER — DILTIAZEM HCL ER COATED BEADS 240 MG PO CP24: 240.0000 mg | ORAL_CAPSULE | Freq: Every day | ORAL | Status: DC

## 2018-10-18 NOTE — Progress Notes (Addendum)
PROGRESS NOTE  Chelsea Meza IRC:[id number] DOB: March 26, 1936 DOA: 10/17/2018 PCP: Iona Beard, MD  Brief History:  83 year old female with a history of hypertension, diabetes mellitus type 2, PSVT, CKD stage III, TIA, hyperlipidemia presenting with elevated CBGs.  The patient states that she had been in her usual state of health without any complaints.  She routinely checks her CBGs once or twice per day.  On 10/17/2018, she noted her CBG to be reading "high".  As result, the patient presented for further evaluation. Patient denies fevers, chills, headache, chest pain, dyspnea, nausea, vomiting, diarrhea, abdominal pain, dysuria, hematuria, hematochezia, and melena. Upon admission, the patient was noted to have serum glucose of 885 with anion gap of 12.  The patient was started on IV fluids and IV insulin.  In the evening of 10/17/2018, the patient developed SVT with heart rate in the 160s.  She converted back to sinus with vagal maneuvers.  Cardiology was consulted to assist with management.   Assessment/Plan: Nonketotic hyperosmolar state -patient started on IV insulin with q 1 hour CBG check and q 4 hour BMPs -pt started on aggressive fluid resuscitation -Electrolytes were monitored and repleted -transitioned to [place] insulin once anion gap closed -diet was advanced once anion gap closed -HbA1C--pending  PSVT -appreciate cardiology consult -6/19--discussed with Dr. [name] to long acting verapamil -TSH 1.449 -pt was hemodynamically unstable overnight with abnormal electrolytes  Acute on chronic renal failure--CKD stage III -Baseline creatinine 1.5-1.8 -Serum creatinine peaked 2.45 -Secondary to volume depletion -Continue IV fluids another 24 hours  Essential hypertension -Increase verapamil as discussed -Continue hydralazine  Hyperlipidemia -Continue statin -Check lipid panel  Diabetes mellitus type 2, uncontrolled with hyperglycemia -Follow-up hemoglobin  A1c -NovoLog sliding scale for now -Holding glimepiride  Hypokalemia -replete -check mag      Disposition Plan:   Home 6/20 or 6/21 Family Communication:   Family at bedside  Consultants:    Code Status:  FULL / DNR  DVT Prophylaxis:  [place] Heparin / [place] Lovenox   Procedures: As Listed in Progress Note Above  Antibiotics: None   Total time spent 35 minutes.  Greater than 50% spent face to face counseling and coordinating care. 0830 to 0905    Subjective: Patient denies fevers, chills, headache, chest pain, dyspnea, nausea, vomiting, diarrhea, abdominal pain, dysuria, hematuria, hematochezia, and melena.   Objective: Vitals:   10/18/18 0335 10/18/18 0400 10/18/18 0600 10/18/18 0700  BP:  111/82 133/78 135/74  Pulse:  (!) 148 65 66  Resp:  16 15 14   Temp: 98.1 F (36.7 C)     TempSrc: Oral     SpO2:  99% 98% 98%  Weight:   80 kg     Intake/Output Summary (Last 24 hours) at 10/18/2018 0850 Last data filed at 10/18/2018 5102 Gross per 24 hour  Intake 1374.52 ml  Output -  Net 1374.52 ml   Weight change:  Exam:   General:  Pt is alert, follows commands appropriately, not in acute distress  HEENT: No icterus, No thrush, No neck mass, [place]/AT  Cardiovascular: RRR, S1/S2, no rubs, no gallops  Respiratory: CTA bilaterally, no wheezing, no crackles, no rhonchi  Abdomen: Soft/+BS, non tender, non distended, no guarding  Extremities: No edema, No lymphangitis, No petechiae, No rashes, no synovitis   Data Reviewed: I have personally reviewed following labs and imaging studies Basic Metabolic Panel: Recent Labs  Lab 10/17/18 2325 10/18/18 0352  NA 124* 136  K 4.8 3.4*  CL 91* 104  CO2 21* 23  GLUCOSE 885* 245*  BUN 56* 52*  CREATININE 2.45* 2.21*  CALCIUM 9.4 9.5   Liver Function Tests: Recent Labs  Lab 10/17/18 2325 10/18/18 0352  AST 15 15  ALT 18 16  ALKPHOS 104 94  BILITOT 0.9 0.7  PROT 7.1 6.9  ALBUMIN 3.8 3.7   No results for  input(s): LIPASE, AMYLASE in the last 168 hours. No results for input(s): AMMONIA in the last 168 hours. Coagulation Profile: No results for input(s): INR, PROTIME in the last 168 hours. CBC: Recent Labs  Lab 10/17/18 2325 10/18/18 0352  WBC 9.5 13.4*  NEUTROABS 6.7  --   HGB 11.9* 11.5*  HCT 37.9 35.4*  MCV 88.3 85.7  PLT 180 191   Cardiac Enzymes: No results for input(s): CKTOTAL, CKMB, CKMBINDEX, TROPONINI in the last 168 hours. BNP: Invalid input(s): POCBNP CBG: Recent Labs  Lab 10/17/18 2251 10/18/18 0136 10/18/18 0244 10/18/18 0415 10/18/18 0804  GLUCAP >600* >600* 434* 171* 271*   HbA1C: No results for input(s): HGBA1C in the last 72 hours. Urine analysis:    Component Value Date/Time   COLORURINE STRAW (A) 10/18/2018 0015   APPEARANCEUR CLEAR 10/18/2018 0015   LABSPEC 1.015 10/18/2018 0015   PHURINE 5.0 10/18/2018 0015   GLUCOSEU >=500 (A) 10/18/2018 0015   HGBUR NEGATIVE 10/18/2018 0015   BILIRUBINUR NEGATIVE 10/18/2018 0015   KETONESUR NEGATIVE 10/18/2018 0015   PROTEINUR NEGATIVE 10/18/2018 0015   UROBILINOGEN 0.2 07/13/2012 1236   NITRITE NEGATIVE 10/18/2018 0015   LEUKOCYTESUR NEGATIVE 10/18/2018 0015   Sepsis Labs: @LABRCNTIP (procalcitonin:4,lacticidven:4) ) Recent Results (from the past 240 hour(s))  MRSA PCR Screening     Status: None   Collection Time: 10/18/18  3:20 AM   Specimen: Nasal Mucosa; Nasopharyngeal  Result Value Ref Range Status   MRSA by PCR NEGATIVE NEGATIVE Final    Comment:        The GeneXpert MRSA Assay (FDA approved for NASAL specimens only), is one component of a comprehensive MRSA colonization surveillance program. It is not intended to diagnose MRSA infection nor to guide or monitor treatment for MRSA infections. Performed at [facility], [address] [place], [place] [address]      Scheduled Meds: . adenosine (ADENOCARD) IV  6 mg Intravenous STAT  . adenosine      . aspirin EC  81 mg Oral Daily  .  atorvastatin  80 mg Oral Daily  . diltiazem  240 mg Oral Daily  . enoxaparin (LOVENOX) injection  30 mg Subcutaneous Q24H  . glimepiride  8 mg Oral Q breakfast  . hydrALAZINE  50 mg Oral TID  . insulin aspart  0-5 Units Subcutaneous QHS  . insulin aspart  0-9 Units Subcutaneous TID WC   Continuous Infusions: . sodium chloride 75 mL/hr at 10/18/18 0620    Procedures/Studies: No results found.  Orson Eva, DO  [facility] Pager [phone]  If 7PM-7AM, please contact night-coverage [email or web address] Password [email or web address] 10/18/2018, 8:50 AM   LOS: 0 days

## 2018-10-18 NOTE — Progress Notes (Signed)
12 lead ekg shows STEMI at 0418 MD notified.

## 2018-10-18 NOTE — Progress Notes (Signed)
MD Tat paged at this time d/t critical troponin of 0.25. Pt denies CP or any pain at this time. Resting with eyes closed. HR is 75.

## 2018-10-18 NOTE — ED Notes (Signed)
Date and time results received: 10/18/18 0007  Test: Glucose Critical Value: 885  Name of Provider Notified: Rancour, MD

## 2018-10-18 NOTE — Progress Notes (Signed)
Shawna Clamp D/w cardiology fellow to review EKG=> EKG shows SVT, ? AVNRT, if valsalva does not work , recommended adenosine.

## 2018-10-18 NOTE — Progress Notes (Addendum)
Responded to nursing call:  Elevated troponin up to 0.25   Subjective: Pt resting comfortably with eyes closed when I arrived.  She denies cp, sob, nausea, vomiting  Vitals:   10/18/18 1645 10/18/18 1715 10/18/18 1730 10/18/18 1745  BP: (!) 173/88 (!) 157/69 (!) 148/73 136/69  Pulse: 66 72 71 71  Resp: 18 20 (!) 23 18  Temp:      TempSrc:      SpO2: 99% 100% 100% 99%  Weight:      Height:       CV--RRR Lung--CTA Abd--soft+BS/NT   Assessment/Plan: Elevated troponin -likely demand ischemia -no chest pain -not consistent with ACS -repeat EKG--personally reviewed--sinus, nonspecific T wave changes     Orson Eva, DO [facility]

## 2018-10-18 NOTE — Progress Notes (Addendum)
Inpatient Diabetes Program Recommendations  AACE/ADA: New Consensus Statement on Inpatient Glycemic Control (2015)  Target Ranges:  Prepandial:   less than 140 mg/dL      Peak postprandial:   less than 180 mg/dL (1-2 hours)      Critically ill patients:  140 - 180 mg/dL   Lab Results  Component Value Date   GLUCAP 271 (H) 10/18/2018   HGBA1C 6.8 (H) 11/06/2017    Review of Glycemic Control Results for Chelsea Meza, Chelsea Meza (MRN [id number]) as of 10/18/2018 11:01  Ref. Range 10/18/2018 01:36 10/18/2018 02:44 10/18/2018 04:15 10/18/2018 08:04  Glucose-Capillary Latest Ref Range: 70 - 99 mg/dL >600 (HH) 434 (H) 171 (H) 271 (H)   Diabetes history: Type 2 DM Outpatient Diabetes medications: Janumet 50-1000 mg QD, Amaryl 8 mg QAM Current orders for Inpatient glycemic control: Novolog 0-9 units TID, Novolog 0-5 units QHS  Inpatient Diabetes Program Recommendations:    A1C- pending. Anticipate need for insulin.   After transition off glucostabilizer, current CBG 271 mg/dL. Patient could benefit from basal insulin: Consider Lantus 10 units QD.   Addendum: Spoke with patient regarding outpatient diabetes management. Patient and daughter report having difficulty with PCP and pharmacy coordinating oral medications. At the last appointment, PCP discontinued Janumet and switch Glimepiride to BID dosing, however did not write for enough tablets to be filled. Consequently, patient was without oral medications. Is open to using insulin outpatient, if necessary.  Reviewed that team is waiting on result of A1C. Explained what a A1c is and what it measures. Also reviewed goal A1c with patient, importance of good glucose control @ home, and blood sugar goals. Reviewed patho of DM, survival skills, sign and symptoms of hypo vs hyper glycemia, need for insulin, vascular changes and comorbidites.  Patient has a meter and checks 2 times per day and that glucose ranges, "all over the place." It was the patient's  birthday when she was admitted and daughter reports patient indulged in cake and that this was unusual. Daughter feels that this contributed to admission, along with being without medications. Patient is scheduled to see PCP on Monday.  Discussed potential need for insulin outpatient. Will email over videos for patient and daughter to view in the event it is decided based on A1C results and inpatient needs. Will reach out to RN to help patient with self injection.  Educated patient and daughter on insulin pen use at home. Reviewed contents of insulin flexpen starter kit. Reviewed all steps if insulin pen including attachment of needle, 2-unit air shot, dialing up dose, giving injection, removing needle, disposal of sharps, storage of unused insulin, disposal of insulin etc.   Thanks, Bronson Curb, MSN, RNC-OB Diabetes Coordinator [phone] (8a-5p)

## 2018-10-18 NOTE — H&P (Addendum)
TRH H&P    Patient Demographics:    Chelsea Meza, is a 83 y.o. female  MRN: [id number]  DOB - 07/28/1935  Admit Date - 10/17/2018  Referring MD/NP/PA:  Ezequiel [place]  Outpatient Primary MD for the patient is Iona Beard, MD  Patient coming from: home  Chief complaint- hyperglycemia   HPI:    Chelsea Meza  is a 83 y.o. female, w hypertension, dm2, w ckd stage3, PSVT, h/o Tia apparently had her sugar checked at home and it registered high.  Pt notes that she ran out of her glimepiride.  Pt therefore presented to ED.    In ED,  T 98.2, P 71 R 18 Bp 198/95  Pox 99% on ra  Wbc 9.5, Hgb 11.9, Plt 180 Na 124, K 4.8, Bun 56, Creatinine 2.45, Hco3 21 Glucose 885 Lactic acid 1.5  Pt started on insulin gtt, as well as normal saline at 149mL per hour.   Pt will be admitted for hyperglycemia (honk), and uncontrolled hypertension, and acute on chronic renal failure.      Review of systems:    In addition to the HPI above,  No Fever-chills, No Headache, No changes with Vision or hearing, No problems swallowing food or Liquids, No Chest pain, Cough or Shortness of Breath, No Abdominal pain, No Nausea or Vomiting, bowel movements are regular, No Blood in stool or Urine, No dysuria, No new skin rashes or bruises, No new joints pains-aches,  No new weakness, tingling, numbness in any extremity, No recent weight gain or loss, No polyuria, polydypsia or polyphagia, No significant Mental Stressors.  All other systems reviewed and are negative.    Past History of the following :    Past Medical History:  Diagnosis Date  . Anemia   . CKD (chronic kidney disease) stage 3, GFR 30-59 ml/min ([name])   . Diabetes mellitus without complication ([place])   . Hyperlipidemia   . Hypertension       Past Surgical History:  Procedure Laterality Date  . CHOLECYSTECTOMY        Social History:       Social History   Tobacco Use  . Smoking status: Never Smoker  . Smokeless tobacco: Never Used  Substance Use Topics  . Alcohol use: No       Family History :     Family History  Problem Relation Age of Onset  . Stroke Mother   . Stroke Maternal Grandmother        Home Medications:   Prior to Admission medications   Medication Sig Start Date End Date Taking? Authorizing Provider  aspirin EC 81 MG tablet Take 81-325 mg by mouth daily.     [provider]  atorvastatin (LIPITOR) 80 MG tablet Take 1 tablet (80 mg total) by mouth daily. 11/07/17   Orson Eva, MD  glimepiride (AMARYL) 4 MG tablet Take 2 tablets (8 mg total) by mouth daily with breakfast. Patient taking differently: Take 6 mg by mouth daily with breakfast.  03/23/15   Kathie Dike, MD  hydrALAZINE (APRESOLINE) 50 MG tablet Take 1 tablet (50 mg total) by mouth 3 (three) times daily. Patient taking differently: Take 100 mg by mouth 3 (three) times daily.  03/23/15   Kathie Dike, MD  sitaGLIPtin-metformin (JANUMET) 50-1000 MG tablet Take 1 tablet by mouth 2 (two) times daily with a meal. Patient taking differently: Take 1 tablet by mouth daily.  03/23/15   Kathie Dike, MD  verapamil (CALAN) 120 MG tablet Take 1 tablet (120 mg total) by mouth 3 (three) times daily. 11/06/17   Orson Eva, MD     Allergies:    No Known Allergies   Physical Exam:   Vitals  Blood pressure (!) 206/94, pulse 71, temperature 98.2 F (36.8 C), temperature source Oral, resp. rate 18, SpO2 99 %.  1.  General: axoxo3  2. Psychiatric: euthymic  3. Neurologic: cn2-12 intact, reflexes 2+ symmetric, diffuse with no clonus, motor 5/5 in all 4 ext,   4. HEENMT:  Anicteric, pupils 1.65mm symmetric, direct, consensual intact Neck: no jvd  5. Respiratory : CTAB  6. Cardiovascular : rrr s1, s2, 1/6 sem rusb  7. Gastrointestinal:  Abd: soft, nt, nd, +bs  8. Skin:  Ext: no c/c/e,  No rash  9.Musculoskeletal:   Good rom  No adenopathy    Data Review:    CBC Recent Labs  Lab 10/17/18 2325  WBC 9.5  HGB 11.9*  HCT 37.9  PLT 180  MCV 88.3  MCH 27.7  MCHC 31.4  RDW 13.2  LYMPHSABS 2.2  MONOABS 0.6  EOSABS 0.1  BASOSABS 0.1   ------------------------------------------------------------------------------------------------------------------  Results for orders placed or performed during the hospital encounter of 10/17/18 (from the past 48 hour(s))  CBG monitoring, ED     Status: Abnormal   Collection Time: 10/17/18 10:51 PM  Result Value Ref Range   Glucose-Capillary >600 (HH) 70 - 99 mg/dL   Comment 1 Document in Chart   CBC with Differential/Platelet     Status: Abnormal   Collection Time: 10/17/18 11:25 PM  Result Value Ref Range   WBC 9.5 4.0 - 10.5 K/uL   RBC 4.29 3.87 - 5.11 MIL/uL   Hemoglobin 11.9 (L) 12.0 - 15.0 g/dL   HCT 37.9 36.0 - 46.0 %   MCV 88.3 80.0 - 100.0 fL   MCH 27.7 26.0 - 34.0 pg   MCHC 31.4 30.0 - 36.0 g/dL   RDW 13.2 11.5 - 15.5 %   Platelets 180 150 - 400 K/uL   nRBC 0.0 0.0 - 0.2 %   Neutrophils Relative % 69 %   Neutro Abs 6.7 1.7 - 7.7 K/uL   Lymphocytes Relative 23 %   Lymphs Abs 2.2 0.7 - 4.0 K/uL   Monocytes Relative 6 %   Monocytes Absolute 0.6 0.1 - 1.0 K/uL   Eosinophils Relative 1 %   Eosinophils Absolute 0.1 0.0 - 0.5 K/uL   Basophils Relative 1 %   Basophils Absolute 0.1 0.0 - 0.1 K/uL   Immature Granulocytes 0 %   Abs Immature Granulocytes 0.03 0.00 - 0.07 K/uL    Comment: Performed at [facility], [address], [place], [place] [address]  Comprehensive metabolic panel     Status: Abnormal   Collection Time: 10/17/18 11:25 PM  Result Value Ref Range   Sodium 124 (L) 135 - 145 mmol/L   Potassium 4.8 3.5 - 5.1 mmol/L   Chloride 91 (L) 98 - 111 mmol/L   CO2 21 (L) 22 - 32 mmol/L   Glucose, Bld  885 (HH) 70 - 99 mg/dL    Comment: CRITICAL RESULT CALLED TO, READ BACK BY AND VERIFIED WITH: SAPPELT,J.  AT 0007 ON 10/18/2018 BY  EVA    BUN 56 (H) 8 - 23 mg/dL   Creatinine, Ser 2.45 (H) 0.44 - 1.00 mg/dL   Calcium 9.4 8.9 - 10.3 mg/dL   Total Protein 7.1 6.5 - 8.1 g/dL   Albumin 3.8 3.5 - 5.0 g/dL   AST 15 15 - 41 U/L   ALT 18 0 - 44 U/L   Alkaline Phosphatase 104 38 - 126 U/L   Total Bilirubin 0.9 0.3 - 1.2 mg/dL   GFR calc non Af Amer 18 (L) >60 mL/min   GFR calc Af Amer 21 (L) >60 mL/min   Anion gap 12 5 - 15    Comment: Performed at [facility], [address] [place], [place] [address]  Lactic acid, plasma     Status: None   Collection Time: 10/17/18 11:25 PM  Result Value Ref Range   Lactic Acid, Venous 1.5 0.5 - 1.9 mmol/L    Comment: Performed at [facility], [address], [place], [place] [address]  Blood gas, venous (at [facility], not at [facility])     Status: Abnormal   Collection Time: 10/17/18 11:25 PM  Result Value Ref Range   FIO2 99.00    pH, Ven 7.384 7.250 - 7.430   pCO2, Ven 39.5 (L) 44.0 - 60.0 mmHg   pO2, Ven 59.2 (H) 32.0 - 45.0 mmHg   Bicarbonate 23.2 20.0 - 28.0 mmol/L   Acid-base deficit 1.3 0.0 - 2.0 mmol/L   O2 Saturation 90.6 %   Patient temperature 37.0     Comment: Performed at [facility], [address] [place], [place] [address]  Urinalysis, Routine w reflex microscopic     Status: Abnormal   Collection Time: 10/18/18 12:15 AM  Result Value Ref Range   Color, Urine STRAW (A) YELLOW   APPearance CLEAR CLEAR   Specific Gravity, Urine 1.015 1.005 - 1.030   pH 5.0 5.0 - 8.0   Glucose, UA >=500 (A) NEGATIVE mg/dL   Hgb urine dipstick NEGATIVE NEGATIVE   Bilirubin Urine NEGATIVE NEGATIVE   Ketones, ur NEGATIVE NEGATIVE mg/dL   Protein, ur NEGATIVE NEGATIVE mg/dL   Nitrite NEGATIVE NEGATIVE   Leukocytes,Ua NEGATIVE NEGATIVE   WBC, UA 0-5 0 - 5 WBC/hpf   Bacteria, UA NONE SEEN NONE SEEN   Squamous Epithelial / LPF 0-5 0 - 5   Mucus PRESENT     Comment: Performed at [facility], [address], [place],  [address]    Chemistries  Recent Labs  Lab  10/17/18 2325  NA 124*  K 4.8  CL 91*  CO2 21*  GLUCOSE 885*  BUN 56*  CREATININE 2.45*  CALCIUM 9.4  AST 15  ALT 18  ALKPHOS 104  BILITOT 0.9   ------------------------------------------------------------------------------------------------------------------  ------------------------------------------------------------------------------------------------------------------ GFR: CrCl cannot be calculated (Unknown ideal weight.). Liver Function Tests: Recent Labs  Lab 10/17/18 2325  AST 15  ALT 18  ALKPHOS 104  BILITOT 0.9  PROT 7.1  ALBUMIN 3.8   No results for input(s): LIPASE, AMYLASE in the last 168 hours. No results for input(s): AMMONIA in the last 168 hours. Coagulation Profile: No results for input(s): INR, PROTIME in the last 168 hours. Cardiac Enzymes: No results for input(s): CKTOTAL, CKMB, CKMBINDEX, TROPONINI in the last 168 hours. BNP (last 3 results) No results for input(s): PROBNP in the last  8760 hours. HbA1C: No results for input(s): HGBA1C in the last 72 hours. CBG: Recent Labs  Lab 10/17/18 2251  GLUCAP >600*   Lipid Profile: No results for input(s): CHOL, HDL, LDLCALC, TRIG, CHOLHDL, LDLDIRECT in the last 72 hours. Thyroid Function Tests: No results for input(s): TSH, T4TOTAL, FREET4, T3FREE, THYROIDAB in the last 72 hours. Anemia Panel: No results for input(s): VITAMINB12, FOLATE, FERRITIN, TIBC, IRON, RETICCTPCT in the last 72 hours.  --------------------------------------------------------------------------------------------------------------- Urine analysis:    Component Value Date/Time   COLORURINE STRAW (A) 10/18/2018 0015   APPEARANCEUR CLEAR 10/18/2018 0015   LABSPEC 1.015 10/18/2018 0015   PHURINE 5.0 10/18/2018 0015   GLUCOSEU >=500 (A) 10/18/2018 0015   HGBUR NEGATIVE 10/18/2018 0015   BILIRUBINUR NEGATIVE 10/18/2018 0015   KETONESUR NEGATIVE 10/18/2018 0015   PROTEINUR NEGATIVE 10/18/2018 0015   UROBILINOGEN 0.2  07/13/2012 1236   NITRITE NEGATIVE 10/18/2018 0015   LEUKOCYTESUR NEGATIVE 10/18/2018 0015      Imaging Results:    No results found.    Assessment & Plan:    Principal Problem:   Diabetic hyperosmolar non-ketotic state ([place]) Active Problems:   DKA, type 2 ([place])   Acute renal failure superimposed on stage 3 chronic kidney disease ([place])   Essential hypertension, benign   Hyperglycemia  Hyperglycemia, (honk) NPO til bs <200 Ns at 51mL per hour  Insulin gtt til bs <200  Dm2 Restart amaryl 8mg  po qday Stop Janumet due to renal failure  Acute renal failure on CKD stage 3 Hydrate with ns iv STOP Janumet due to renal failure Check cmp in am  Hypertension uncontrolled Cont Verapamil 120mg  po tid Cont hydralazine 100mg  po tid Start amlodipine 2.5mg  po qday  ? TIA/Hyperlipidemia Cont Lipitor 80mg  po qhs Cont Aspirin 81mg  po qday    DVT Prophylaxis-   Lovenox - SCDs  AM Labs Ordered, also please review Full Orders  Family Communication: Admission, patients condition and plan of care including tests being ordered have been discussed with the patient  who indicate understanding and agree with the plan and Code Status.  Code Status:  FULL CODE, attempted to contact daughter, at number in computer, got fax ring tone.  She is not picking up at the number the nurse put in the computer, and there is no voicemail.    Admission status: Observation: Based on patients clinical presentation and evaluation of above clinical data, I have made determination that patient meets observation criteria at this time.  Time spent in minutes : 70   Jani Gravel M.D on 10/18/2018 at 1:18 AM

## 2018-10-18 NOTE — Progress Notes (Signed)
[facility] Called for SVT again Resolved with valsalva,  Asked RN to verapamil early.  Will place cardiology consult in computer to assist with PSVT, appreciate input

## 2018-10-18 NOTE — ED Notes (Addendum)
Pt's daughter is Ebbie Latus and she can be reached at [phone]) or [phone] (H).

## 2018-10-18 NOTE — Clinical Social Work Note (Signed)
Patient is agreeable to [facility]. Choices given of providers.  Referral sent to [facility].  Patient lives with daughter, has a walker but does not use. Patient is independent in ADLs. Daughter provides transportation.     Sarabeth Benton, Clydene Pugh, LCSW

## 2018-10-18 NOTE — ED Notes (Signed)
Pt's daughter called to check on mother and was updated on her condition as well as pending admission.

## 2018-10-18 NOTE — Progress Notes (Signed)
Pt performed vagal maneuver and HR is back down into the 80s showing NSR on monitor.

## 2018-10-18 NOTE — Consult Note (Addendum)
Cardiology Consultation:   Patient ID: [id number] GUBLER; [id number]; November 06, 1935   Admit date: 10/17/2018 Date of Consult: 10/18/2018  Primary Care Provider: Iona Beard, MD Primary Cardiologist: No primary care provider on file. New Primary Electrophysiologist:  None   Patient Profile:   Chelsea Meza is a 83 y.o. female with a hx of DM, HTN, HLD, CKD IV, TIA, PSVT, who is being seen today for the evaluation of SVT at the request of Dr Maudie Mercury.  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 She had been admitted for a visual disturbance, visual fields became white and she could not see for about 30 minutes. Etiology not determined. She had PSVT during that stay, no symptoms. Verapamil increase recommended.   Chelsea Meza was admitted 06/18 for HONK, glucose 885. She had some SVT during the night and cardiology was asked to evaluate her.   Chelsea Meza is aware of the SVT when it happens.  She will feel her heart running away.  It does not happen that often, she thinks about once a month.  It will start spontaneously and stop spontaneously.  She does not do anything to make it stop.  She is not aware of anything that brings it on.  When it happens, she can feel her heart going fast, but denies any shortness of breath, dizziness or presyncope with it.  She does not get chest pain with it.  She will feel tired and fatigued will persist for a while after it stops.  That is the only thing.  She lives with her daughter.  She walks and thinks she can walk a block but has not done that recently.  She does not climb stairs.  She gets a little swelling in her legs and feet at times.  She never gets chest pain or shortness of breath with exertion.  She has never had a cardiology evaluation.   Past Medical History:  Diagnosis Date  . Anemia   . CKD (chronic kidney disease) stage 3, GFR 30-59 ml/min ([name])   . Diabetes mellitus without  complication ([place])   . Hyperlipidemia   . Hypertension   . TIA (transient ischemic attack)     Past Surgical History:  Procedure Laterality Date  . CHOLECYSTECTOMY       Prior to Admission medications   Medication Sig Start Date End Date Taking? Authorizing Provider  aspirin EC 81 MG tablet Take 81-325 mg by mouth daily.     [provider]  atorvastatin (LIPITOR) 80 MG tablet Take 1 tablet (80 mg total) by mouth daily. 11/07/17   Orson Eva, MD  glimepiride (AMARYL) 4 MG tablet Take 2 tablets (8 mg total) by mouth daily with breakfast. Patient taking differently: Take 6 mg by mouth daily with breakfast.  03/23/15   Kathie Dike, MD  hydrALAZINE (APRESOLINE) 50 MG tablet Take 1 tablet (50 mg total) by mouth 3 (three) times daily. Patient taking differently: Take 100 mg by mouth 3 (three) times daily.  03/23/15   Kathie Dike, MD  sitaGLIPtin-metformin (JANUMET) 50-1000 MG tablet Take 1 tablet by mouth 2 (two) times daily with a meal. Patient taking differently: Take 1 tablet by mouth daily.  03/23/15   Kathie Dike, MD  verapamil (CALAN) 120 MG tablet Take 1 tablet (120 mg total) by mouth 3 (three) times daily. 11/06/17   Orson Eva, MD    Inpatient Medications: Scheduled Meds: . adenosine (ADENOCARD) IV  6 mg Intravenous STAT  . adenosine      . aspirin EC  81 mg Oral Daily  . atorvastatin  80 mg Oral Daily  . enoxaparin (LOVENOX) injection  30 mg Subcutaneous Q24H  . glimepiride  8 mg Oral Q breakfast  . hydrALAZINE  50 mg Oral TID  . insulin aspart  0-5 Units Subcutaneous QHS  . insulin aspart  0-9 Units Subcutaneous TID WC  . verapamil  120 mg Oral TID   Continuous Infusions: . sodium chloride 75 mL/hr at 10/18/18 0620   PRN Meds: acetaminophen **OR** acetaminophen  Allergies:   No Known Allergies  Social History:   Social History   Socioeconomic History  . Marital status: Divorced    Spouse name: Not on file  . Number of children: Not on file  .  Years of education: Not on file  . Highest education level: Not on file  Occupational History  . Not on file  Social Needs  . Financial resource strain: Not on file  . Food insecurity    Worry: Not on file    Inability: Not on file  . Transportation needs    Medical: Not on file    Non-medical: Not on file  Tobacco Use  . Smoking status: Never Smoker  . Smokeless tobacco: Never Used  Substance and Sexual Activity  . Alcohol use: No  . Drug use: No  . Sexual activity: Not on file  Lifestyle  . Physical activity    Days per week: Not on file    Minutes per session: Not on file  . Stress: Not on file  Relationships  . Social Herbalist on phone: Not on file    Gets together: Not on file    Attends religious service: Not on file    Active member of club or organization: Not on file    Attends meetings of clubs or organizations: Not on file    Relationship status: Not on file  . Intimate partner violence    Fear of current or ex partner: Not on file    Emotionally abused: Not on file    Physically abused: Not on file    Forced sexual activity: Not on file  Other Topics Concern  . Not on file  Social History Narrative   Patient lives in [place] with her daughter.  1 level home.    Family History:   Family History  Problem Relation Age of Onset  . Stroke Mother   . Stroke Maternal Grandmother    Family Status:  Family Status  Relation Name Status  . Mother  Deceased  . MGM  Deceased    ROS:  Please see the history of present illness.  All other ROS reviewed and negative.     Physical Exam/Data:   Vitals:   10/18/18 0300 10/18/18 0335 10/18/18 0400 10/18/18 0600  BP: (!) 172/93  111/82 133/78  Pulse: 82  (!) 148 65  Resp: 20  16 15   Temp:  98.1 F (36.7 C)    TempSrc:  Oral    SpO2: 100%  99% 98%  Weight:    80 kg    Intake/Output Summary (Last 24 hours) at 10/18/2018 0827 Last data filed at 10/18/2018 2595 Gross per 24 hour  Intake  1374.52 ml  Output -  Net 1374.52 ml   Filed Weights   10/18/18 0600  Weight: 80 kg   Body mass index is 26.82 kg/m.  General:  Well nourished, well developed, in no acute distress HEENT: normal Lymph: no adenopathy Neck: minimal JVD Endocrine:  No thryomegaly Vascular: No carotid bruits; 4/4 extremity pulses 2+, without bruits  Cardiac:  normal S1, S2; RRR; -3/6 murmur  Lungs:  clear to auscultation bilaterally, no wheezing, rhonchi or rales  Abd: soft, nontender, no hepatomegaly  Ext: no edema Musculoskeletal:  No deformities, BUE and BLE strength normal and equal Skin: warm and dry  Neuro:  CNs 2-12 intact, no focal abnormalities noted Psych:  Normal affect   EKG:  The EKG was personally reviewed and demonstrates:  06/19 ECG is SVT, HR 162, prev ECG was SR  Telemetry:  Telemetry was personally reviewed and demonstrates:  Pt in SVT when place on telemetry and was in/out SR for about 2 hr  Relevant CV Studies:  ECHO: 11/06/2017 - Left ventricle: The cavity size was normal. Wall thickness was   increased in a pattern of moderate LVH. Systolic function was   vigorous. The estimated ejection fraction was in the range of 65%   to 70%. Wall motion was normal; there were no regional wall   motion abnormalities. Doppler parameters are consistent with   abnormal left ventricular relaxation (grade 1 diastolic   dysfunction). - Aortic valve: Mildly calcified annulus. Trileaflet; normal   thickness leaflets. Valve area (VTI): 3.05 cm^2. Valve area   (Vmax): 2.77 cm^2. - Mitral valve: Mildly calcified annulus. Mildly thickened leaflets   . - Technically adequate study.   Laboratory Data:  Chemistry Recent Labs  Lab 10/17/18 2325 10/18/18 0352  NA 124* 136  K 4.8 3.4*  CL 91* 104  CO2 21* 23  GLUCOSE 885* 245*  BUN 56* 52*  CREATININE 2.45* 2.21*  CALCIUM 9.4 9.5  GFRNONAA 18* 20*  GFRAA 21* 23*  ANIONGAP 12 9    Lab Results  Component Value Date   ALT 16  10/18/2018   AST 15 10/18/2018   ALKPHOS 94 10/18/2018   BILITOT 0.7 10/18/2018   Hematology Recent Labs  Lab 10/17/18 2325 10/18/18 0352  WBC 9.5 13.4*  RBC 4.29 4.13  HGB 11.9* 11.5*  HCT 37.9 35.4*  MCV 88.3 85.7  MCH 27.7 27.8  MCHC 31.4 32.5  RDW 13.2 12.7  PLT 180 191   Cardiac EnzymesNo results for input(s): TROPONINI in the last 168 hours. No results for input(s): TROPIPOC in the last 168 hours.   TSH:  Lab Results  Component Value Date   TSH 1.449 10/17/2018   Lipids: Lab Results  Component Value Date   CHOL 205 (H) 11/06/2017   HDL 53 11/06/2017   LDLCALC 131 (H) 11/06/2017   TRIG 103 11/06/2017   CHOLHDL 3.9 11/06/2017   HgbA1c: Lab Results  Component Value Date   HGBA1C 6.8 (H) 11/06/2017   Magnesium:  Magnesium  Date Value Ref Range Status  11/07/2017 1.9 1.7 - 2.4 mg/dL Final    Comment:    Performed at [facility], [address], [place], [place] [address]     Radiology/Studies:  No results found.  Assessment and Plan:   1. PSVT - pt was initially in SR w/ HR 70s>>SVT>>SR - had documented episodes last July when here for ?TIA - also has as outpt, about once a month - she is on Verapamil 120 mg tid (though from hospitalist report pill bottles read only 80mg  tid, she reports limited compliance) - will d/c verapamil and start long acting diltiazem 180mg  daily, titrate as needed - of note her  SVT thsi admission has broken with valsalva this admission  2. Abnl Echo - from last July had AOV calcification, no significant stenosis - recheck to follow, she has murmur on exam  Otherwise, per IM Principal Problem:   Diabetic hyperosmolar non-ketotic state ([place]) Active Problems:   DKA, type 2 ([place])   Acute renal failure superimposed on stage 3 chronic kidney disease ([place])   Essential hypertension, benign   Hyperglycemia     For questions or updates, please contact [facility] Please consult [email or web address] for contact info under  Cardiology/STEMI.   Chelsea Speak, PA-C  10/18/2018 8:27 AM Attending note  Patient seen and discussed with PA Barrett, I agree with her documentation above. 83 yo female history of DM2, HTN, CKD 3, PSVT, and prior TIA admitted with hyperglycemia. Being managed for HONK and AKI by primary team. 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 Mild improvement this AM Regarding her PSVT she has a prior history of this and has been on verapamil 120mg  tid at home (though in talking with primary team her pill bottles show verapamil 80mg  tid).During this admission SVT has broke with valsalva maneuvers. Keep K at 4, Mg at 2, Mg lab is pending. I would change her verapamil to diltiazem long acting 180 mg daily to simplify her regimen, can titrate further if needed. She reports some issues with compliance taking her verapamil tid. REpeat echo due to cardiac murmur.    Carlyle Dolly MD

## 2018-10-19 DIAGNOSIS — E1110 Type 2 diabetes mellitus with ketoacidosis without coma: Secondary | ICD-10-CM

## 2018-10-19 DIAGNOSIS — R739 Hyperglycemia, unspecified: Secondary | ICD-10-CM

## 2018-10-19 LAB — MAGNESIUM: Magnesium: 1.9 mg/dL (ref 1.7–2.4)

## 2018-10-19 LAB — CBC
HCT: 35.3 % — ABNORMAL LOW (ref 36.0–46.0)
Hemoglobin: 11.1 g/dL — ABNORMAL LOW (ref 12.0–15.0)
MCH: 27.2 pg (ref 26.0–34.0)
MCHC: 31.4 g/dL (ref 30.0–36.0)
MCV: 86.5 fL (ref 80.0–100.0)
Platelets: 173 10*3/uL (ref 150–400)
RBC: 4.08 MIL/uL (ref 3.87–5.11)
RDW: 13 % (ref 11.5–15.5)
WBC: 8.8 10*3/uL (ref 4.0–10.5)
nRBC: 0 % (ref 0.0–0.2)

## 2018-10-19 LAB — NOVEL CORONAVIRUS, NAA (HOSP ORDER, SEND-OUT TO REF LAB; TAT 18-24 HRS): SARS-CoV-2, NAA: NOT DETECTED

## 2018-10-19 LAB — BASIC METABOLIC PANEL
Anion gap: 7 (ref 5–15)
BUN: 40 mg/dL — ABNORMAL HIGH (ref 8–23)
CO2: 21 mmol/L — ABNORMAL LOW (ref 22–32)
Calcium: 8.9 mg/dL (ref 8.9–10.3)
Chloride: 103 mmol/L (ref 98–111)
Creatinine, Ser: 1.96 mg/dL — ABNORMAL HIGH (ref 0.44–1.00)
GFR calc Af Amer: 27 mL/min — ABNORMAL LOW (ref >60)
GFR calc non Af Amer: 23 mL/min — ABNORMAL LOW (ref >60)
Glucose, Bld: 367 mg/dL — ABNORMAL HIGH (ref 70–99)
Potassium: 4.6 mmol/L (ref 3.5–5.1)
Sodium: 131 mmol/L — ABNORMAL LOW (ref 135–145)

## 2018-10-19 LAB — GLUCOSE, CAPILLARY
Glucose-Capillary: 349 mg/dL — ABNORMAL HIGH (ref 70–99)
Glucose-Capillary: 368 mg/dL — ABNORMAL HIGH (ref 70–99)
Glucose-Capillary: 159 mg/dL — ABNORMAL HIGH (ref 70–99)
Glucose-Capillary: 250 mg/dL — ABNORMAL HIGH (ref 70–99)

## 2018-10-19 LAB — HEMOGLOBIN A1C
Hgb A1c MFr Bld: 14.1 % — ABNORMAL HIGH (ref 4.8–5.6)
Mean Plasma Glucose: 358 mg/dL

## 2018-10-19 MED ORDER — INSULIN ASPART 100 UNIT/ML ~~LOC~~ SOLN
0.0000 [IU] | Freq: Every day | SUBCUTANEOUS | Status: DC
  Administered 2018-10-19: 2 [IU] via SUBCUTANEOUS

## 2018-10-19 MED ORDER — INSULIN ASPART 100 UNIT/ML ~~LOC~~ SOLN
0.0000 [IU] | Freq: Three times a day (TID) | SUBCUTANEOUS | Status: DC
  Administered 2018-10-19: 20 [IU] via SUBCUTANEOUS
  Administered 2018-10-19 – 2018-10-20 (×2): 4 [IU] via SUBCUTANEOUS
  Administered 2018-10-20: 7 [IU] via SUBCUTANEOUS
  Administered 2018-10-20: 15 [IU] via SUBCUTANEOUS
  Administered 2018-10-21: 7 [IU] via SUBCUTANEOUS
  Administered 2018-10-21 – 2018-10-22 (×3): 4 [IU] via SUBCUTANEOUS

## 2018-10-19 MED ORDER — HYDRALAZINE HCL 25 MG PO TABS
100.0000 mg | ORAL_TABLET | Freq: Three times a day (TID) | ORAL | Status: DC
  Administered 2018-10-19 – 2018-10-22 (×11): 100 mg via ORAL
  Filled 2018-10-19 (×11): qty 4

## 2018-10-19 MED ORDER — ONDANSETRON HCL 4 MG/2ML IJ SOLN: 4.0000 mg | Freq: Four times a day (QID) | INTRAMUSCULAR | Status: DC | PRN

## 2018-10-19 MED ORDER — INSULIN GLARGINE 100 UNIT/ML ~~LOC~~ SOLN
20.0000 [IU] | Freq: Every day | SUBCUTANEOUS | Status: DC
  Administered 2018-10-19 – 2018-10-20 (×2): 20 [IU] via SUBCUTANEOUS
  Filled 2018-10-19 (×3): qty 0.2

## 2018-10-19 MED ORDER — SODIUM CHLORIDE 0.9 % IV SOLN
INTRAVENOUS | Status: DC
  Administered 2018-10-19 – 2018-10-20 (×2): via INTRAVENOUS

## 2018-10-19 NOTE — Progress Notes (Signed)
PROGRESS NOTE  Chelsea Meza SFK:[id number] DOB: 05-07-1935 DOA: 10/17/2018 PCP: Iona Beard, MD  Brief History:  83 year old female with a history of hypertension, diabetes mellitus type 2, PSVT, CKD stage III, TIA, hyperlipidemia presenting with elevated CBGs.  The patient states that she had been in her usual state of health without any complaints.  She routinely checks her CBGs once or twice per day.  On 10/17/2018, she noted her CBG to be reading "high".  As result, the patient presented for further evaluation. Patient denies fevers, chills, headache, chest pain, dyspnea, nausea, vomiting, diarrhea, abdominal pain, dysuria, hematuria, hematochezia, and melena. Upon admission, the patient was noted to have serum glucose of 885 with anion gap of 12.  The patient was started on IV fluids and IV insulin.  In the evening of 10/17/2018, the patient developed SVT with heart rate in the 160s.  She converted back to sinus with vagal maneuvers.  Cardiology was consulted to assist with management.   Assessment/Plan: Nonketotic hyperosmolar state -patient started on IV insulin with q 1 hour CBG check and q 4 hour BMPs -pt started on aggressive fluid resuscitation -Electrolytes were monitored and repleted -transitioned to [place]inger insulin once anion gap closed -diet was advanced once anion gap closed -HbA1C--14.1  PSVT -appreciate cardiology consult -6/19--discussed with Dr. [name] to long acting verapamil -TSH 1.449 -pt was hemodynamically unstable overnight with abnormal electrolytes  Acute on chronic renal failure--CKD stage III -Baseline creatinine 1.5-1.8 -Serum creatinine peaked 2.45 -Secondary to volume depletion -Continue IV fluids another 24 hours  Elevated troponin -not ACS -no chest pain -demand ischemia -continue ASA  Essential hypertension -Increase verapamil as discussed -Increase hydralazine  Hyperlipidemia -Continue statin -Check lipid  panel  Diabetes mellitus type 2, uncontrolled with hyperglycemia -Follow-up hemoglobin A1c--14.1 -NovoLog sliding scale for now -Holding glimepiride -increase lantus to 20 units -increase to resistant sliding scale  Hypokalemia -repleted -check mag--1.9    Disposition Plan:   Home 6/21 if stable Family Communication:   No Family at bedside  Consultants:  none  Code Status:  FULL  DVT Prophylaxis:   [place] Lovenox   Procedures: As Listed in Progress Note Above  Antibiotics: None   Total time spent 35 minutes.  Greater than 50% spent face to face counseling and coordinating care.    Subjective: Patient denies fevers, chills, headache, chest pain, dyspnea, nausea, vomiting, diarrhea, abdominal pain, dysuria, hematuria, hematochezia, and melena.   Objective: Vitals:   10/18/18 2030 10/18/18 2030 10/19/18 0500 10/19/18 0640  BP:  (!) 184/77  (!) 199/102  Pulse:  (!) 59  66  Resp:  17    Temp:  98.6 F (37 C)  98.2 F (36.8 C)  TempSrc:  Oral  Oral  SpO2:  99%  100%  Weight: 80.7 kg  80.7 kg   Height: 5\' 9"  (1.753 m)       Intake/Output Summary (Last 24 hours) at 10/19/2018 1125 Last data filed at 10/19/2018 0900 Gross per 24 hour  Intake 1297.5 ml  Output 500 ml  Net 797.5 ml   Weight change: 0.7 kg Exam:   General:  Pt is alert, follows commands appropriately, not in acute distress  HEENT: No icterus, No thrush, No neck mass, [place]/AT  Cardiovascular: RRR, S1/S2, no rubs, no gallops  Respiratory: CTA bilaterally, no wheezing, no crackles, no rhonchi  Abdomen: Soft/+BS, non tender, non distended, no guarding  Extremities: No edema, No lymphangitis, No petechiae, No rashes,  no synovitis   Data Reviewed: I have personally reviewed following labs and imaging studies Basic Metabolic Panel: Recent Labs  Lab 10/17/18 2325 10/18/18 0352 10/18/18 0815 10/19/18 0710  NA 124* 136  --  131*  K 4.8 3.4*  --  4.6  CL 91* 104  --  103  CO2  21* 23  --  21*  GLUCOSE 885* 245*  --  367*  BUN 56* 52*  --  40*  CREATININE 2.45* 2.21*  --  1.96*  CALCIUM 9.4 9.5  --  8.9  MG  --   --  2.2 1.9   Liver Function Tests: Recent Labs  Lab 10/17/18 2325 10/18/18 0352  AST 15 15  ALT 18 16  ALKPHOS 104 94  BILITOT 0.9 0.7  PROT 7.1 6.9  ALBUMIN 3.8 3.7   No results for input(s): LIPASE, AMYLASE in the last 168 hours. No results for input(s): AMMONIA in the last 168 hours. Coagulation Profile: No results for input(s): INR, PROTIME in the last 168 hours. CBC: Recent Labs  Lab 10/17/18 2325 10/18/18 0352 10/19/18 0714  WBC 9.5 13.4* 8.8  NEUTROABS 6.7  --   --   HGB 11.9* 11.5* 11.1*  HCT 37.9 35.4* 35.3*  MCV 88.3 85.7 86.5  PLT 180 191 173   Cardiac Enzymes: Recent Labs  Lab 10/18/18 0815 10/18/18 1114 10/18/18 1645  TROPONINI 0.03* 0.03* 0.25*   BNP: Invalid input(s): POCBNP CBG: Recent Labs  Lab 10/18/18 0804 10/18/18 1148 10/18/18 1559 10/19/18 0725 10/19/18 1103  GLUCAP 271* 324* 304* 349* 368*   HbA1C: Recent Labs    10/17/18 2325  HGBA1C 14.1*   Urine analysis:    Component Value Date/Time   COLORURINE STRAW (A) 10/18/2018 0015   APPEARANCEUR CLEAR 10/18/2018 0015   LABSPEC 1.015 10/18/2018 0015   PHURINE 5.0 10/18/2018 0015   GLUCOSEU >=500 (A) 10/18/2018 0015   HGBUR NEGATIVE 10/18/2018 0015   BILIRUBINUR NEGATIVE 10/18/2018 0015   KETONESUR NEGATIVE 10/18/2018 0015   PROTEINUR NEGATIVE 10/18/2018 0015   UROBILINOGEN 0.2 07/13/2012 1236   NITRITE NEGATIVE 10/18/2018 0015   LEUKOCYTESUR NEGATIVE 10/18/2018 0015   Sepsis Labs: @LABRCNTIP (procalcitonin:4,lacticidven:4) ) Recent Results (from the past 240 hour(s))  MRSA PCR Screening     Status: None   Collection Time: 10/18/18  3:20 AM   Specimen: Nasal Mucosa; Nasopharyngeal  Result Value Ref Range Status   MRSA by PCR NEGATIVE NEGATIVE Final    Comment:        The GeneXpert MRSA Assay (FDA approved for NASAL  specimens only), is one component of a comprehensive MRSA colonization surveillance program. It is not intended to diagnose MRSA infection nor to guide or monitor treatment for MRSA infections. Performed at [facility], [address] [place], [place] [address]      Scheduled Meds: . aspirin EC  81 mg Oral Daily  . atorvastatin  80 mg Oral Daily  . enoxaparin (LOVENOX) injection  30 mg Subcutaneous Q24H  . hydrALAZINE  100 mg Oral TID  . insulin aspart  0-20 Units Subcutaneous TID WC  . insulin aspart  0-5 Units Subcutaneous QHS  . insulin glargine  20 Units Subcutaneous Daily  . verapamil  240 mg Oral Daily   Continuous Infusions:  Procedures/Studies: No results found.  Orson Eva, DO  [facility] Pager [phone]  If 7PM-7AM, please contact night-coverage [email or web address] Password [email or web address] 10/19/2018, 11:25 AM   LOS: 1 day

## 2018-10-20 ENCOUNTER — Inpatient Hospital Stay (HOSPITAL_COMMUNITY): Payer: Medicare Other

## 2018-10-20 DIAGNOSIS — I358 Other nonrheumatic aortic valve disorders: Secondary | ICD-10-CM

## 2018-10-20 LAB — BASIC METABOLIC PANEL
Anion gap: 8 (ref 5–15)
BUN: 37 mg/dL — ABNORMAL HIGH (ref 8–23)
CO2: 20 mmol/L — ABNORMAL LOW (ref 22–32)
Calcium: 8.6 mg/dL — ABNORMAL LOW (ref 8.9–10.3)
Chloride: 104 mmol/L (ref 98–111)
Creatinine, Ser: 1.85 mg/dL — ABNORMAL HIGH (ref 0.44–1.00)
GFR calc Af Amer: 29 mL/min — ABNORMAL LOW (ref >60)
GFR calc non Af Amer: 25 mL/min — ABNORMAL LOW (ref >60)
Glucose, Bld: 237 mg/dL — ABNORMAL HIGH (ref 70–99)
Potassium: 4.4 mmol/L (ref 3.5–5.1)
Sodium: 132 mmol/L — ABNORMAL LOW (ref 135–145)

## 2018-10-20 LAB — ECHOCARDIOGRAM COMPLETE
Height: 69 in
Weight: 2948.87 oz

## 2018-10-20 LAB — LIPID PANEL
Cholesterol: 143 mg/dL (ref 0–200)
HDL: 61 mg/dL (ref >40)
LDL Cholesterol: 67 mg/dL (ref 0–99)
Total CHOL/HDL Ratio: 2.3 RATIO
Triglycerides: 76 mg/dL (ref <150)
VLDL: 15 mg/dL (ref 0–40)

## 2018-10-20 LAB — GLUCOSE, CAPILLARY
Glucose-Capillary: 223 mg/dL — ABNORMAL HIGH (ref 70–99)
Glucose-Capillary: 325 mg/dL — ABNORMAL HIGH (ref 70–99)
Glucose-Capillary: 171 mg/dL — ABNORMAL HIGH (ref 70–99)
Glucose-Capillary: 99 mg/dL (ref 70–99)

## 2018-10-20 MED ORDER — METOPROLOL TARTRATE 25 MG PO TABS
12.5000 mg | ORAL_TABLET | Freq: Two times a day (BID) | ORAL | Status: DC
  Administered 2018-10-20 – 2018-10-21 (×3): 12.5 mg via ORAL
  Filled 2018-10-20 (×3): qty 1

## 2018-10-20 MED ORDER — METOPROLOL TARTRATE 25 MG PO TABS: 25.0000 mg | ORAL_TABLET | Freq: Two times a day (BID) | ORAL | Status: DC

## 2018-10-20 MED ORDER — INSULIN GLARGINE 100 UNIT/ML ~~LOC~~ SOLN
30.0000 [IU] | Freq: Every day | SUBCUTANEOUS | Status: DC
  Administered 2018-10-21: 30 [IU] via SUBCUTANEOUS
  Filled 2018-10-20 (×2): qty 0.3

## 2018-10-20 MED ORDER — INSULIN GLARGINE 100 UNIT/ML ~~LOC~~ SOLN
10.0000 [IU] | Freq: Once | SUBCUTANEOUS | Status: AC
  Administered 2018-10-20: 10 [IU] via SUBCUTANEOUS
  Filled 2018-10-20: qty 0.1

## 2018-10-20 MED ORDER — INSULIN ASPART 100 UNIT/ML ~~LOC~~ SOLN
4.0000 [IU] | Freq: Three times a day (TID) | SUBCUTANEOUS | Status: DC
  Administered 2018-10-20 – 2018-10-22 (×5): 4 [IU] via SUBCUTANEOUS

## 2018-10-20 NOTE — Progress Notes (Signed)
Given verbal order per Dr. Georges Mouse to give patient's verapamil 240 mg early for increased BP of 196/144.  Notation made on patient MAR as well.

## 2018-10-20 NOTE — Progress Notes (Signed)
*  PRELIMINARY RESULTS* Echocardiogram 2D Echocardiogram has been performed.  Chelsea Meza 10/20/2018, 1:39 PM

## 2018-10-20 NOTE — Progress Notes (Signed)
PROGRESS NOTE  Chelsea Meza PNT:[id number] DOB: Feb 12, 1936 DOA: 10/17/2018 PCP: Iona Beard, MD  Brief History:  83 year old female with a history of hypertension, diabetes mellitus type 2, PSVT, CKD stage III, TIA, hyperlipidemia presenting with elevated CBGs. The patient states that she had been in her usual state of health without any complaints. She routinely checks her CBGs once or twice per day. On 10/17/2018, she noted her CBG to be reading "high". As result, the patient presented for further evaluation. Patient denies fevers, chills, headache, chest pain, dyspnea, nausea, vomiting, diarrhea, abdominal pain, dysuria, hematuria, hematochezia, and melena. Upon admission, the patient was noted to have serum glucose of 885 with anion gap of 12. The patient was started on IV fluids and IV insulin. In the evening of 10/17/2018, the patient developed SVT with heart rate in the 160s. She converted back to sinus with vagal maneuvers.Cardiology was consulted to assist with management.  The patient's verapamil dose was increased and consolidated to long-acting 240 mg daily.  The patient did still have infrequent short paroxysmal SVT episodes.  Metoprolol tartrate was added.  Assessment/Plan: Nonketotic hyperosmolar state -patient started on IV insulin with q 1 hour CBG check and q 4 hour BMPs -pt started on aggressive fluid resuscitation -Electrolytes were monitored and repleted -transitioned to Akins insulin once anion gap closed -diet was advanced once anion gap closed -HbA1C--14.1  PSVT -appreciate cardiology consult -6/19--discussed with Dr. [name] to long acting verapamil 240 mg -TSH 1.449 -pt was hemodynamically unstable overnight with abnormal electrolytes -still with intermittent short episodes am 6/21 -add low dose metoprolol tartrate  Acute on chronic renal failure--CKD stage III -Baseline creatinine 1.5-1.8 -Serum creatinine peaked 2.45 -Secondary to  volume depletion -saline lock IVF  Elevated troponin -not ACS -no chest pain -demand ischemia -continue ASA -10/20/2018 echo EF 60-65%, impaired relaxation.  Normal RV.  No pericardial effusion.  Essential hypertension -Increase verapamil as discussed -Increase hydralazine100mg  tid -added metoprolol  Hyperlipidemia -Continue statin -Check lipid panel--LDL67  Diabetes mellitus type 2, uncontrolled with hyperglycemia -Follow-up hemoglobin A1c--14.1 -NovoLog sliding scale for now -Holding glimepiride -increase lantus to 30 units -increase to resistant sliding scale -add novolog 4 unts with meals  Hypokalemia -repleted -check mag--1.9    Disposition Plan: Home 6/22 if stable Family Communication: No Family at bedside  Consultants: none  Code Status: FULL  DVT Prophylaxis:   Lovenox   Procedures: As Listed in Progress Note Above  Antibiotics: None           Subjective: Patient denies fevers, chills, headache, chest pain, dyspnea, nausea, vomiting, diarrhea, abdominal pain, dysuria, hematuria, hematochezia, and melena.   Objective: Vitals:   10/20/18 0500 10/20/18 0547 10/20/18 0755 10/20/18 1345  BP:  (!) 196/114 (!) 188/96 (!) 172/88  Pulse:  80 72 71  Resp:  17  16  Temp:  98.3 F (36.8 C)  98.2 F (36.8 C)  TempSrc:    Oral  SpO2:  99%  98%  Weight: 83.6 kg     Height:        Intake/Output Summary (Last 24 hours) at 10/20/2018 1539 Last data filed at 10/20/2018 1100 Gross per 24 hour  Intake 2073.04 ml  Output -  Net 2073.04 ml   Weight change: 2.9 kg Exam:   General:  Pt is alert, follows commands appropriately, not in acute distress  HEENT: No icterus, No thrush, No neck mass, [place]/AT  Cardiovascular: RRR, S1/S2, no rubs, no gallops  Respiratory: CTA bilaterally, no wheezing, no crackles, no rhonchi  Abdomen: Soft/+BS, non tender, non distended, no guarding  Extremities: No edema, No lymphangitis, No  petechiae, No rashes, no synovitis   Data Reviewed: I have personally reviewed following labs and imaging studies Basic Metabolic Panel: Recent Labs  Lab 10/17/18 2325 10/18/18 0352 10/18/18 0815 10/19/18 0710 10/20/18 0537  NA 124* 136  --  131* 132*  K 4.8 3.4*  --  4.6 4.4  CL 91* 104  --  103 104  CO2 21* 23  --  21* 20*  GLUCOSE 885* 245*  --  367* 237*  BUN 56* 52*  --  40* 37*  CREATININE 2.45* 2.21*  --  1.96* 1.85*  CALCIUM 9.4 9.5  --  8.9 8.6*  MG  --   --  2.2 1.9  --    Liver Function Tests: Recent Labs  Lab 10/17/18 2325 10/18/18 0352  AST 15 15  ALT 18 16  ALKPHOS 104 94  BILITOT 0.9 0.7  PROT 7.1 6.9  ALBUMIN 3.8 3.7   No results for input(s): LIPASE, AMYLASE in the last 168 hours. No results for input(s): AMMONIA in the last 168 hours. Coagulation Profile: No results for input(s): INR, PROTIME in the last 168 hours. CBC: Recent Labs  Lab 10/17/18 2325 10/18/18 0352 10/19/18 0714  WBC 9.5 13.4* 8.8  NEUTROABS 6.7  --   --   HGB 11.9* 11.5* 11.1*  HCT 37.9 35.4* 35.3*  MCV 88.3 85.7 86.5  PLT 180 191 173   Cardiac Enzymes: Recent Labs  Lab 10/18/18 0815 10/18/18 1114 10/18/18 1645  TROPONINI 0.03* 0.03* 0.25*   BNP: Invalid input(s): POCBNP CBG: Recent Labs  Lab 10/19/18 1103 10/19/18 1630 10/19/18 2152 10/20/18 0713 10/20/18 1116  GLUCAP 368* 159* 250* 223* 325*   HbA1C: Recent Labs    10/17/18 2325  HGBA1C 14.1*   Urine analysis:    Component Value Date/Time   COLORURINE STRAW (A) 10/18/2018 0015   APPEARANCEUR CLEAR 10/18/2018 0015   LABSPEC 1.015 10/18/2018 0015   PHURINE 5.0 10/18/2018 0015   GLUCOSEU >=500 (A) 10/18/2018 0015   HGBUR NEGATIVE 10/18/2018 0015   BILIRUBINUR NEGATIVE 10/18/2018 0015   KETONESUR NEGATIVE 10/18/2018 0015   PROTEINUR NEGATIVE 10/18/2018 0015   UROBILINOGEN 0.2 07/13/2012 1236   NITRITE NEGATIVE 10/18/2018 0015   LEUKOCYTESUR NEGATIVE 10/18/2018 0015   Sepsis  Labs: @LABRCNTIP (procalcitonin:4,lacticidven:4) ) Recent Results (from the past 240 hour(s))  Novel Coronavirus,NAA,(SEND-OUT TO REF LAB - [facility] 24-48 hrs); Hosp Order     Status: None   Collection Time: 10/18/18 12:40 AM   Specimen: Nasopharyngeal Swab; Respiratory  Result Value Ref Range Status   SARS-CoV-2, NAA NOT DETECTED NOT DETECTED Final    Comment: (NOTE) This test was developed and its performance characteristics determined by [employer]. This test has not been FDA cleared or approved. This test has been authorized by FDA under an Emergency Use Authorization (EUA). This test is only authorized for the duration of time the declaration that circumstances exist justifying the authorization of the emergency use of in vitro diagnostic tests for detection of SARS-CoV-2 virus and/or diagnosis of COVID-19 infection under section 564(b)(1) of the Act, 21 U.S.C. 656CLE-7(N)(1), unless the authorization is terminated or revoked sooner. When diagnostic testing is negative, the possibility of a false negative result should be considered in the context of a patient's recent exposures and the presence of clinical signs and symptoms consistent with COVID-19. An individual without symptoms of  COVID-19 and who is not shedding SARS-CoV-2 virus would expect to have a negative (not detected) result in this assay. Performed  At: [facility] [address] [place], [place] [id number] Rush Farmer MD SJ:[id number]    [place]  Final    Comment: Performed at [facility], [address] [place], [place] [address]  MRSA PCR Screening     Status: None   Collection Time: 10/18/18  3:20 AM   Specimen: Nasal Mucosa; Nasopharyngeal  Result Value Ref Range Status   MRSA by PCR NEGATIVE NEGATIVE Final    Comment:        The GeneXpert MRSA Assay (FDA approved for NASAL specimens only), is one component of a comprehensive MRSA colonization surveillance  program. It is not intended to diagnose MRSA infection nor to guide or monitor treatment for MRSA infections. Performed at [facility], [address], [place], [place] [address]      Scheduled Meds: . aspirin EC  81 mg Oral Daily  . atorvastatin  80 mg Oral Daily  . enoxaparin (LOVENOX) injection  30 mg Subcutaneous Q24H  . hydrALAZINE  100 mg Oral TID  . insulin aspart  0-20 Units Subcutaneous TID WC  . insulin aspart  0-5 Units Subcutaneous QHS  . insulin aspart  4 Units Subcutaneous TID WC  . [START ON 10/21/2018] insulin glargine  30 Units Subcutaneous Daily  . metoprolol tartrate  12.5 mg Oral BID  . verapamil  240 mg Oral Daily   Continuous Infusions:  Procedures/Studies: No results found.  Orson Eva, DO  [facility] Pager [phone]  If 7PM-7AM, please contact night-coverage [email or web address] Password [facility] 10/20/2018, 3:39 PM   LOS: 2 days

## 2018-10-21 DIAGNOSIS — N183 Chronic kidney disease, stage 3 (moderate): Secondary | ICD-10-CM

## 2018-10-21 DIAGNOSIS — E1100 Type 2 diabetes mellitus with hyperosmolarity without nonketotic hyperglycemic-hyperosmolar coma (NKHHC): Principal | ICD-10-CM

## 2018-10-21 DIAGNOSIS — I10 Essential (primary) hypertension: Secondary | ICD-10-CM

## 2018-10-21 DIAGNOSIS — N179 Acute kidney failure, unspecified: Secondary | ICD-10-CM

## 2018-10-21 LAB — GLUCOSE, CAPILLARY
Glucose-Capillary: 76 mg/dL (ref 70–99)
Glucose-Capillary: 97 mg/dL (ref 70–99)
Glucose-Capillary: 209 mg/dL — ABNORMAL HIGH (ref 70–99)
Glucose-Capillary: 154 mg/dL — ABNORMAL HIGH (ref 70–99)
Glucose-Capillary: 107 mg/dL — ABNORMAL HIGH (ref 70–99)

## 2018-10-21 LAB — BASIC METABOLIC PANEL
Anion gap: 8 (ref 5–15)
BUN: 41 mg/dL — ABNORMAL HIGH (ref 8–23)
CO2: 21 mmol/L — ABNORMAL LOW (ref 22–32)
Calcium: 8.9 mg/dL (ref 8.9–10.3)
Chloride: 105 mmol/L (ref 98–111)
Creatinine, Ser: 2.1 mg/dL — ABNORMAL HIGH (ref 0.44–1.00)
GFR calc Af Amer: 25 mL/min — ABNORMAL LOW (ref >60)
GFR calc non Af Amer: 22 mL/min — ABNORMAL LOW (ref >60)
Glucose, Bld: 86 mg/dL (ref 70–99)
Potassium: 4.1 mmol/L (ref 3.5–5.1)
Sodium: 134 mmol/L — ABNORMAL LOW (ref 135–145)

## 2018-10-21 LAB — MAGNESIUM: Magnesium: 1.7 mg/dL (ref 1.7–2.4)

## 2018-10-21 MED ORDER — MAGNESIUM SULFATE 2 GM/50ML IV SOLN
2.0000 g | Freq: Once | INTRAVENOUS | Status: AC
  Administered 2018-10-21: 2 g via INTRAVENOUS
  Filled 2018-10-21: qty 50

## 2018-10-21 MED ORDER — LIVING WELL WITH DIABETES BOOK
Freq: Once | Status: AC
  Administered 2018-10-21: 14:00:00

## 2018-10-21 MED ORDER — POTASSIUM CHLORIDE IN NACL 20-0.9 MEQ/L-% IV SOLN
INTRAVENOUS | Status: AC
  Administered 2018-10-21: 18:00:00 via INTRAVENOUS

## 2018-10-21 MED ORDER — CARVEDILOL 3.125 MG PO TABS
3.1250 mg | ORAL_TABLET | Freq: Two times a day (BID) | ORAL | Status: DC
  Administered 2018-10-21 – 2018-10-22 (×2): 3.125 mg via ORAL
  Filled 2018-10-21 (×2): qty 1

## 2018-10-21 MED ORDER — INSULIN STARTER KIT- PEN NEEDLES (ENGLISH)
1.0000 | Freq: Once | Status: AC
  Administered 2018-10-21: 1
  Filled 2018-10-21: qty 1

## 2018-10-21 NOTE — Progress Notes (Addendum)
Progress Note  Patient Name: Chelsea Meza Date of Encounter: 10/21/2018  Primary Cardiologist: Carlyle Dolly, MD  Subjective   Brief run of tachy this AM (?SVT with hidden P due to long first degree AVB vs junctional tach) but totally asymptomatic.   Inpatient Medications    Scheduled Meds: . aspirin EC  81 mg Oral Daily  . atorvastatin  80 mg Oral Daily  . enoxaparin (LOVENOX) injection  30 mg Subcutaneous Q24H  . hydrALAZINE  100 mg Oral TID  . insulin aspart  0-20 Units Subcutaneous TID WC  . insulin aspart  0-5 Units Subcutaneous QHS  . insulin aspart  4 Units Subcutaneous TID WC  . insulin glargine  30 Units Subcutaneous Daily  . metoprolol tartrate  12.5 mg Oral BID  . verapamil  240 mg Oral Daily   Continuous Infusions:  PRN Meds: acetaminophen **OR** acetaminophen, ondansetron (ZOFRAN) IV   Vital Signs    Vitals:   10/20/18 1345 10/20/18 2159 10/21/18 0500 10/21/18 0530  BP: (!) 172/88 (!) 175/90  (!) 170/90  Pulse: 71 61  60  Resp: 16 17  17   Temp: 98.2 F (36.8 C) 98.1 F (36.7 C)  97.9 F (36.6 C)  TempSrc: Oral     SpO2: 98% 99%  100%  Weight:   84.8 kg   Height:        Intake/Output Summary (Last 24 hours) at 10/21/2018 0814 Last data filed at 10/20/2018 1700 Gross per 24 hour  Intake 720 ml  Output -  Net 720 ml   Last 3 Weights 10/21/2018 10/20/2018 10/19/2018  Weight (lbs) 186 lb 15.2 oz 184 lb 4.9 oz 177 lb 14.6 oz  Weight (kg) 84.8 kg 83.6 kg 80.7 kg     Telemetry    NSR with brief run this AM of tachy ? SVT with retrograde P wave vs junctional tachycardia - Personally Reviewed  Physical Exam   GEN: No acute distress.  HEENT: Normocephalic, atraumatic, sclera non-icteric. Neck: No JVD or bruits. Cardiac: RRR. Soft SEM, no rubs or gallops.  Radials/DP/PT 1+ and equal bilaterally.  Respiratory: Clear to auscultation bilaterally. Breathing is unlabored. GI: Soft, nontender, non-distended, BS +x 4. MS: no  deformity. Extremities: No clubbing or cyanosis. No edema. Distal pedal pulses are 2+ and equal bilaterally. Neuro:  AAOx3. Follows commands. Psych:  Responds to questions appropriately with a normal affect.  Labs    Chemistry Recent Labs  Lab 10/17/18 2325 10/18/18 0352 10/19/18 0710 10/20/18 0537 10/21/18 0550  NA 124* 136 131* 132* 134*  K 4.8 3.4* 4.6 4.4 4.1  CL 91* 104 103 104 105  CO2 21* 23 21* 20* 21*  GLUCOSE 885* 245* 367* 237* 86  BUN 56* 52* 40* 37* 41*  CREATININE 2.45* 2.21* 1.96* 1.85* 2.10*  CALCIUM 9.4 9.5 8.9 8.6* 8.9  PROT 7.1 6.9  --   --   --   ALBUMIN 3.8 3.7  --   --   --   AST 15 15  --   --   --   ALT 18 16  --   --   --   ALKPHOS 104 94  --   --   --   BILITOT 0.9 0.7  --   --   --   GFRNONAA 18* 20* 23* 25* 22*  GFRAA 21* 23* 27* 29* 25*  ANIONGAP 12 9 7 8 8      Hematology Recent Labs  Lab 10/17/18 2325 10/18/18 0352 10/19/18  0714  WBC 9.5 13.4* 8.8  RBC 4.29 4.13 4.08  HGB 11.9* 11.5* 11.1*  HCT 37.9 35.4* 35.3*  MCV 88.3 85.7 86.5  MCH 27.7 27.8 27.2  MCHC 31.4 32.5 31.4  RDW 13.2 12.7 13.0  PLT 180 191 173    Cardiac Enzymes Recent Labs  Lab 10/18/18 0815 10/18/18 1114 10/18/18 1645  TROPONINI 0.03* 0.03* 0.25*   No results for input(s): TROPIPOC in the last 168 hours.     Radiology    No results found.  Cardiac Studies   2D echo 10/20/18 IMPRESSIONS    1. The left ventricle has normal systolic function with an ejection fraction of 60-65%. The cavity size was normal. There is moderately increased left ventricular wall thickness. Left ventricular diastolic Doppler parameters are consistent with impaired  relaxation. No evidence of left ventricular regional wall motion abnormalities.  2. The right ventricle has normal systolic function. The cavity was normal. There is no increase in right ventricular wall thickness.  3. The pericardial effusion is circumferential.  4. Trivial pericardial effusion is present.   5. The aortic valve is tricuspid. Mild thickening of the aortic valve. Mild calcification of the aortic valve.  Patient Profile     83 y.o. female with uncontrolled DM, HTN, HLD, CKD III, TIA, PSVT who was admitted for [facility] (glucose 885), AKI on CKD stage III, hypokalemia, mildly elevated troponin. Cardiology asked to see for PSVT.  Assessment & Plan    1. Nonketotic hyperosmolar state with underlying uncontrolled DM (A1C 14.1) - management per primary team.  2. PSVT - 2D echo with normal LV function, normal RV, trivial pericardial effusion. TSH wnl.   3. AKI on CKD stage III-IV with hypokalemia, hypomagnesemia - Cr 2.45 on admission, but previously 1.8-2 in 10/2017 so likely now near baseline. Lytes per primary team. Recommend to keep K 4.0 or greater and Mg 2.0.  4. Elevated troponin - felt due to demand ischemia. No further workup anticipated. LVEF normal.  5. HTN - per primary team. Remains quite high. UA without significant proteinuria. TSH wnl. Consideration could be given to changing metoprolol to carvedilol. Need to watch resting HR given first degree AVB and baseline HR 60s when not in SVT. She was on spiro at home but with AKI would not resume at this time.   For questions or updates, please contact [place] Please consult [email or web address] for contact info under Cardiology/STEMI.  Signed, Charlie Pitter, PA-C 10/21/2018, 8:14 AM    The patient was seen and examined, and I agree with the history, physical exam, assessment and plan as documented above.  She continues to experience paroxysms of SVT although she is entirely asymptomatic.  Blood pressure remains elevated and I agree with consideration for switching to carvedilol.  Magnesium is low normal at 1.7 and I would recommend supplementation to keep levels greater than 2.  No further recommendations from my standpoint.  We will sign off.  Kate Sable, MD, [facility]  10/21/2018 10:16 AM

## 2018-10-21 NOTE — Progress Notes (Signed)
Inpatient Diabetes Program Recommendations  AACE/ADA: New Consensus Statement on Inpatient Glycemic Control (2015)  Target Ranges:  Prepandial:   less than 140 mg/dL      Peak postprandial:   less than 180 mg/dL (1-2 hours)      Critically ill patients:  140 - 180 mg/dL   Lab Results  Component Value Date   GLUCAP 209 (H) 10/21/2018   HGBA1C 14.1 (H) 10/17/2018    Review of Glycemic Control Results for Chelsea Meza, Chelsea Meza (MRN [id number]) as of 10/21/2018 13:46  Ref. Range 10/20/2018 22:01 10/21/2018 07:25 10/21/2018 08:58 10/21/2018 11:36  Glucose-Capillary Latest Ref Range: 70 - 99 mg/dL 99 76 97 209 (H)   Diabetes history: Type 2 DM Outpatient Diabetes medications: Janumet 50-1000 mg QD, Amaryl 8 mg QAM Current orders for Inpatient glycemic control: Lantus 30 units qd, Novolog 4 units tid meal coverage if eats 50%, Novolog 0-20 units TID, Novolog 0-5 units QHS  Inpatient Diabetes Program Recommendations:   Spoke with pt via phone (DM Coordinator @ [facility]) about A1C14.1 (average blood glucose approx. 358 over the past 2-3 months) results with them and explained what an A1C is, basic pathophysiology of DM Type 2, basic home care, basic diabetes diet nutrition principles, importance of checking CBGs and maintaining good CBG control to prevent long-term and short-term complications. Reviewed signs and symptoms of hyperglycemia and hypoglycemia and how to treat hypoglycemia at home. Also reviewed blood sugar goals at home.  RNs to provide ongoing basic DM education at bedside with this patient. Have ordered educational booklet, insulin starter kit, and DM videos.  Patient shared she has been on insulin pens in the past but was taken off of insulin. Nurses, please review insulin pen with patient. Patient has also been drinking regular pepsis which she plans to stop drinking after D/C home.  Thank you, Nani Gasser. Irving Lubbers, RN, MSN, CDE  Diabetes Coordinator Inpatient Glycemic Control  Team Team Pager [phone] (8am-5pm) 10/21/2018 1:55 PM

## 2018-10-21 NOTE — Care Management Important Message (Signed)
Important Message  Patient Details  Name: Chelsea Meza MRN: [id number] Date of Birth: 1935-05-26   Medicare Important Message Given:  Yes     Tommy Medal 10/21/2018, 2:58 PM

## 2018-10-21 NOTE — Progress Notes (Signed)
PROGRESS NOTE  Chelsea Meza:[id number] DOB: Mar 30, 1936 DOA: 10/17/2018 PCP: Iona Beard, MD  Brief History:  83 year old female with a history of hypertension, diabetes mellitus type 2, PSVT, CKD stage III, TIA, hyperlipidemia presenting with elevated CBGs. The patient states that she had been in her usual state of health without any complaints. She routinely checks her CBGs once or twice per day. On 10/17/2018, she noted her CBG to be reading "high". As result, the patient presented for further evaluation. Patient denies fevers, chills, headache, chest pain, dyspnea, nausea, vomiting, diarrhea, abdominal pain, dysuria, hematuria, hematochezia, and melena. Upon admission, the patient was noted to have serum glucose of 885 with anion gap of 12. The patient was started on IV fluids and IV insulin. In the evening of 10/17/2018, the patient developed SVT with heart rate in the 160s. She converted back to sinus with vagal maneuvers.Cardiology was consulted to assist with management.  The patient's verapamil dose was increased and consolidated to long-acting 240 mg daily.  The patient did still have infrequent short paroxysmal SVT episodes.  Metoprolol tartrate was added.  Assessment/Plan: Nonketotic hyperosmolar state -patient started on IV insulin with q 1 hour CBG check and q 4 hour BMPs -pt started on aggressive fluid resuscitation -Electrolytes were monitored and repleted -transitioned to [place] insulin once anion gap closed -diet was advanced once anion gap closed -HbA1C--14.1  PSVT -appreciate cardiology consult -6/19--discussed with Dr. [name] to long acting verapamil 240 mg -TSH 1.449 -still with intermittent short episodes SVT -change metoprolol to coreg to help improve BP  Acute on chronic renal failure--CKD stage III -Baseline creatinine 1.5-1.8 -Serum creatinine peaked 2.45 -Secondary to volume depletion -restart IVF due to increasing serum  creatinine  Elevated troponin -not ACS -no chest pain -demand ischemia -continue ASA -10/20/2018 echo EF 60-65%, impaired relaxation.  Normal RV.  No pericardial effusion.  Essential hypertension -Increase verapamil as discussed -Increasehydralazine100mg  tid -added metoprolol  Hyperlipidemia -Continue statin -Check lipid panel--LDL67  Diabetes mellitus type 2, uncontrolled with hyperglycemia -Follow-up hemoglobin A1c--14.1 -NovoLog sliding scale for now -Holding glimepiride -increase lantus to 30 units -increase to resistant sliding scale -add novolog 4 unts with meals  Hypokalemia -repleted -check mag--1.9    Disposition Plan: Home 6/23if stable Family Communication:NoFamily at bedside  Consultants:none  Code Status: FULL  DVT Prophylaxis: [place] Lovenox   Procedures: As Listed in Progress Note Above  Antibiotics: None      Subjective: Patient denies fevers, chills, headache, chest pain, dyspnea, nausea, vomiting, diarrhea, abdominal pain, dysuria, hematuria, hematochezia, and melena.   Objective: Vitals:   10/21/18 0855 10/21/18 1010 10/21/18 1326 10/21/18 1530  BP: (!) 167/92  140/76 124/74  Pulse: 66  63 (!) 58  Resp:   18   Temp:   98.3 F (36.8 C)   TempSrc:      SpO2:  100% 97%   Weight:      Height:        Intake/Output Summary (Last 24 hours) at 10/21/2018 1724 Last data filed at 10/21/2018 1300 Gross per 24 hour  Intake 480 ml  Output -  Net 480 ml   Weight change: 1.2 kg Exam:   General:  Pt is alert, follows commands appropriately, not in acute distress  HEENT: No icterus, No thrush, No neck mass, [place]/AT  Cardiovascular: RRR, S1/S2, no rubs, no gallops  Respiratory: CTA bilaterally, no wheezing, no crackles, no rhonchi  Abdomen: Soft/+BS, non tender, non distended, no  guarding  Extremities: No edema, No lymphangitis, No petechiae, No rashes, no synovitis   Data Reviewed: I have personally  reviewed following labs and imaging studies Basic Metabolic Panel: Recent Labs  Lab 10/17/18 2325 10/18/18 0352 10/18/18 0815 10/19/18 0710 10/20/18 0537 10/21/18 0550  NA 124* 136  --  131* 132* 134*  K 4.8 3.4*  --  4.6 4.4 4.1  CL 91* 104  --  103 104 105  CO2 21* 23  --  21* 20* 21*  GLUCOSE 885* 245*  --  367* 237* 86  BUN 56* 52*  --  40* 37* 41*  CREATININE 2.45* 2.21*  --  1.96* 1.85* 2.10*  CALCIUM 9.4 9.5  --  8.9 8.6* 8.9  MG  --   --  2.2 1.9  --  1.7   Liver Function Tests: Recent Labs  Lab 10/17/18 2325 10/18/18 0352  AST 15 15  ALT 18 16  ALKPHOS 104 94  BILITOT 0.9 0.7  PROT 7.1 6.9  ALBUMIN 3.8 3.7   No results for input(s): LIPASE, AMYLASE in the last 168 hours. No results for input(s): AMMONIA in the last 168 hours. Coagulation Profile: No results for input(s): INR, PROTIME in the last 168 hours. CBC: Recent Labs  Lab 10/17/18 2325 10/18/18 0352 10/19/18 0714  WBC 9.5 13.4* 8.8  NEUTROABS 6.7  --   --   HGB 11.9* 11.5* 11.1*  HCT 37.9 35.4* 35.3*  MCV 88.3 85.7 86.5  PLT 180 191 173   Cardiac Enzymes: Recent Labs  Lab 10/18/18 0815 10/18/18 1114 10/18/18 1645  TROPONINI 0.03* 0.03* 0.25*   BNP: Invalid input(s): POCBNP CBG: Recent Labs  Lab 10/20/18 2201 10/21/18 0725 10/21/18 0858 10/21/18 1136 10/21/18 1608  GLUCAP 99 76 97 209* 154*   HbA1C: No results for input(s): HGBA1C in the last 72 hours. Urine analysis:    Component Value Date/Time   COLORURINE STRAW (A) 10/18/2018 0015   APPEARANCEUR CLEAR 10/18/2018 0015   LABSPEC 1.015 10/18/2018 0015   PHURINE 5.0 10/18/2018 0015   GLUCOSEU >=500 (A) 10/18/2018 0015   HGBUR NEGATIVE 10/18/2018 0015   BILIRUBINUR NEGATIVE 10/18/2018 0015   KETONESUR NEGATIVE 10/18/2018 0015   PROTEINUR NEGATIVE 10/18/2018 0015   UROBILINOGEN 0.2 07/13/2012 1236   NITRITE NEGATIVE 10/18/2018 0015   LEUKOCYTESUR NEGATIVE 10/18/2018 0015   Sepsis  Labs: @LABRCNTIP (procalcitonin:4,lacticidven:4) ) Recent Results (from the past 240 hour(s))  Novel Coronavirus,NAA,(SEND-OUT TO REF LAB - [facility] 24-48 hrs); Hosp Order     Status: None   Collection Time: 10/18/18 12:40 AM   Specimen: Nasopharyngeal Swab; Respiratory  Result Value Ref Range Status   SARS-CoV-2, NAA NOT DETECTED NOT DETECTED Final    Comment: (NOTE) This test was developed and its performance characteristics determined by [employer]. This test has not been FDA cleared or approved. This test has been authorized by FDA under an Emergency Use Authorization (EUA). This test is only authorized for the duration of time the declaration that circumstances exist justifying the authorization of the emergency use of in vitro diagnostic tests for detection of SARS-CoV-2 virus and/or diagnosis of COVID-19 infection under section 564(b)(1) of the Act, 21 U.S.C. 185UDJ-4(H)(7), unless the authorization is terminated or revoked sooner. When diagnostic testing is negative, the possibility of a false negative result should be considered in the context of a patient's recent exposures and the presence of clinical signs and symptoms consistent with COVID-19. An individual without symptoms of COVID-19 and who is not shedding SARS-CoV-2 virus  would expect to have a negative (not detected) result in this assay. Performed  At: [facility] [address] [place], [place] [id number] Rush Farmer MD FM:[id number]    [place]  Final    Comment: Performed at [facility], [address] [place], [place] [address]  MRSA PCR Screening     Status: None   Collection Time: 10/18/18  3:20 AM   Specimen: Nasal Mucosa; Nasopharyngeal  Result Value Ref Range Status   MRSA by PCR NEGATIVE NEGATIVE Final    Comment:        The GeneXpert MRSA Assay (FDA approved for NASAL specimens only), is one component of a comprehensive MRSA colonization surveillance  program. It is not intended to diagnose MRSA infection nor to guide or monitor treatment for MRSA infections. Performed at [facility], [address] [place], [place] [address]      Scheduled Meds: . aspirin EC  81 mg Oral Daily  . atorvastatin  80 mg Oral Daily  . carvedilol  3.125 mg Oral BID WC  . enoxaparin (LOVENOX) injection  30 mg Subcutaneous Q24H  . hydrALAZINE  100 mg Oral TID  . insulin aspart  0-20 Units Subcutaneous TID WC  . insulin aspart  0-5 Units Subcutaneous QHS  . insulin aspart  4 Units Subcutaneous TID WC  . insulin glargine  30 Units Subcutaneous Daily  . verapamil  240 mg Oral Daily   Continuous Infusions: . 0.9 % NaCl with KCl 20 mEq / L      Procedures/Studies: No results found.  Orson Eva, DO  [facility] Pager [phone]  If 7PM-7AM, please contact night-coverage [email or web address] Password [email or web address] 10/21/2018, 5:24 PM   LOS: 3 days

## 2018-10-22 LAB — BASIC METABOLIC PANEL
Anion gap: 5 (ref 5–15)
BUN: 43 mg/dL — ABNORMAL HIGH (ref 8–23)
CO2: 20 mmol/L — ABNORMAL LOW (ref 22–32)
Calcium: 8.4 mg/dL — ABNORMAL LOW (ref 8.9–10.3)
Chloride: 109 mmol/L (ref 98–111)
Creatinine, Ser: 1.9 mg/dL — ABNORMAL HIGH (ref 0.44–1.00)
GFR calc Af Amer: 28 mL/min — ABNORMAL LOW (ref >60)
GFR calc non Af Amer: 24 mL/min — ABNORMAL LOW (ref >60)
Glucose, Bld: 159 mg/dL — ABNORMAL HIGH (ref 70–99)
Potassium: 4.3 mmol/L (ref 3.5–5.1)
Sodium: 134 mmol/L — ABNORMAL LOW (ref 135–145)

## 2018-10-22 LAB — GLUCOSE, CAPILLARY
Glucose-Capillary: 155 mg/dL — ABNORMAL HIGH (ref 70–99)
Glucose-Capillary: 168 mg/dL — ABNORMAL HIGH (ref 70–99)
Glucose-Capillary: 79 mg/dL (ref 70–99)

## 2018-10-22 LAB — MAGNESIUM: Magnesium: 2.1 mg/dL (ref 1.7–2.4)

## 2018-10-22 MED ORDER — VERAPAMIL HCL ER 240 MG PO TBCR: 240.0000 mg | EXTENDED_RELEASE_TABLET | Freq: Every day | ORAL | 1 refills | Status: DC

## 2018-10-22 MED ORDER — CARVEDILOL 6.25 MG PO TABS: 6.2500 mg | ORAL_TABLET | Freq: Two times a day (BID) | ORAL | 1 refills | Status: DC

## 2018-10-22 MED ORDER — HYDRALAZINE HCL 100 MG PO TABS: 100.0000 mg | ORAL_TABLET | Freq: Three times a day (TID) | ORAL | 1 refills | Status: DC

## 2018-10-22 MED ORDER — CARVEDILOL 3.125 MG PO TABS: 6.2500 mg | ORAL_TABLET | Freq: Two times a day (BID) | ORAL | Status: DC

## 2018-10-22 MED ORDER — INSULIN GLARGINE 100 UNIT/ML ~~LOC~~ SOLN
35.0000 [IU] | Freq: Every day | SUBCUTANEOUS | Status: DC
  Administered 2018-10-22: 35 [IU] via SUBCUTANEOUS
  Filled 2018-10-22 (×2): qty 0.35

## 2018-10-22 MED ORDER — INSULIN GLARGINE 100 UNITS/ML SOLOSTAR PEN: 35.0000 [IU] | PEN_INJECTOR | Freq: Every day | SUBCUTANEOUS | 1 refills | Status: DC

## 2018-10-22 MED ORDER — SITAGLIPTIN PHOSPHATE 50 MG PO TABS: 50.0000 mg | ORAL_TABLET | Freq: Every day | ORAL | 1 refills | Status: DC

## 2018-10-22 NOTE — Progress Notes (Signed)
Nsg Discharge Note  Admit Date:  10/17/2018 Discharge date: 10/22/2018   [name] to be D/C'd Home per MD order.  AVS completed.  Copy for chart, and copy for patient signed, and dated. Patient/caregiver able to verbalize understanding.  Discharge Medication: Allergies as of 10/22/2018   No Known Allergies     Medication List    STOP taking these medications   glimepiride 4 MG tablet Commonly known as: AMARYL   sitaGLIPtin-metformin 50-1000 MG tablet Commonly known as: JANUMET   spironolactone 25 MG tablet Commonly known as: ALDACTONE   verapamil 80 MG tablet Commonly known as: CALAN Replaced by: verapamil 240 MG CR tablet     TAKE these medications   aspirin EC 81 MG tablet Take 81-325 mg by mouth daily.   atorvastatin 80 MG tablet Commonly known as: LIPITOR Take 1 tablet (80 mg total) by mouth daily.   carvedilol 6.25 MG tablet Commonly known as: COREG Take 1 tablet (6.25 mg total) by mouth 2 (two) times daily with a meal.   hydrALAZINE 100 MG tablet Commonly known as: APRESOLINE Take 1 tablet (100 mg total) by mouth 3 (three) times daily. What changed:   medication strength  how much to take   insulin glargine 100 unit/mL Sopn Commonly known as: LANTUS Inject 0.35 mLs (35 Units total) into the skin daily.   ketorolac 0.5 % ophthalmic solution Commonly known as: ACULAR Place 1 drop into the left eye 4 (four) times daily.   ofloxacin 0.3 % ophthalmic solution Commonly known as: OCUFLOX Place 1 drop into the left eye 4 (four) times daily.   sitaGLIPtin 50 MG tablet Commonly known as: JANUVIA Take 1 tablet (50 mg total) by mouth daily.   verapamil 240 MG CR tablet Commonly known as: CALAN-SR Take 1 tablet (240 mg total) by mouth daily. Start taking on: October 23, 2018 Replaces: verapamil 80 MG tablet   VITAMIN B-12 CR PO Take 1 tablet by mouth daily.       Discharge Assessment: Vitals:   10/22/18 0941 10/22/18 1403  BP:  (!) 146/87   Pulse:  70  Resp:  18  Temp:  98.1 F (36.7 C)  SpO2: 96% 100%   Skin clean, dry and intact without evidence of skin break down, no evidence of skin tears noted. IV catheter discontinued intact. Site without signs and symptoms of complications - no redness or edema noted at insertion site, patient denies c/o pain - only slight tenderness at site.  Dressing with slight pressure applied.  D/c Instructions-Education: Discharge instructions given to patient/family with verbalized understanding. D/c education completed with patient/family including follow up instructions, medication list, d/c activities limitations if indicated, with other d/c instructions as indicated by MD - patient able to verbalize understanding, all questions fully answered. Patient instructed to return to ED, call 911, or call MD for any changes in condition.  Patient escorted via [place], and D/C home via private auto.  Loa Socks, RN 10/22/2018 3:04 PM

## 2018-10-22 NOTE — Discharge Summary (Signed)
Physician Discharge Summary  Chelsea Meza:[id number] DOB: 27-Jan-1936 DOA: 10/17/2018  PCP: Iona Beard, MD  Admit date: 10/17/2018 Discharge date: 10/22/2018  Admitted From: Home Disposition:  Home   Recommendations for Outpatient Follow-up:  1. Follow up with PCP in 1-2 weeks 2. Please obtain BMP/CBC in one week     Discharge Condition: Stable CODE STATUS: FULL Diet recommendation: Heart Healthy / Carb Modified   Brief/Interim Summary: 83 year old female with a history of hypertension, diabetes mellitus type 2, PSVT, CKD stage III, TIA, hyperlipidemia presenting with elevated CBGs. The patient states that she had been in her usual state of health without any complaints. She routinely checks her CBGs once or twice per day. On 10/17/2018, she noted her CBG to be reading "high". As result, the patient presented for further evaluation. Patient denies fevers, chills, headache, chest pain, dyspnea, nausea, vomiting, diarrhea, abdominal pain, dysuria, hematuria, hematochezia, and melena. Upon admission, the patient was noted to have serum glucose of 885 with anion gap of 12. The patient was started on IV fluids and IV insulin. In the evening of 10/17/2018, the patient developed SVT with heart rate in the 160s. She converted back to sinus with vagal maneuvers.Cardiology was consulted to assist with management.The patient's verapamil dose was increased and consolidated to long-acting 240 mg daily. The patient did still have infrequent short paroxysmal SVT episodes. Metoprolol tartrate was added.  This was changed to coreg to improve BP control.  Her SVT episodes remained asymptomatic and less frequent.  Discharge Diagnoses:  Nonketotic hyperosmolar state -patient started on IV insulin with q 1 hour CBG check and q 4 hour BMPs -pt started on aggressive fluid resuscitation -Electrolytes were monitored and repleted -transitioned to [place] insulin once anion gap closed -diet was  advanced once anion gap closed -HbA1C--14.1  PSVT -appreciate cardiology consult -6/19--discussed with Dr. [name] to long acting verapamil240 mg -TSH 1.449 -still with intermittent short episodes SVT -change metoprolol to coreg to help improve BP  Acute on chronic renal failure--CKD stage III -Baseline creatinine 1.5-1.8 -Serum creatinine peaked 2.45 -Secondary to volume depletion -restart IVF due to increasing serum creatinine  Elevated troponin -not ACS -no chest pain -demand ischemia -continue ASA -10/20/2018 echo EF 60-65%, impaired relaxation. Normal RV. No pericardial effusion.  Essential hypertension -Increase verapamil as discussed -Increasehydralazine100mg  tid -added metoprolol>>coreg  Hyperlipidemia -Continue statin -Check lipid panel--LDL67  Diabetes mellitus type 2, uncontrolled with hyperglycemia -Follow-up hemoglobin A1c--14.1 -NovoLog sliding scale for now -Holding glimepiride -increase lantus to35 units -increase to resistant sliding scale -add novolog 4 unts with meals during the hospitalization -d/c metformin due to renal function -restart sitagliptin after d/c  Hypokalemia -repleted -check mag--1.9    Discharge Instructions   Allergies as of 10/22/2018   No Known Allergies     Medication List    STOP taking these medications   glimepiride 4 MG tablet Commonly known as: AMARYL   sitaGLIPtin-metformin 50-1000 MG tablet Commonly known as: JANUMET   spironolactone 25 MG tablet Commonly known as: ALDACTONE   verapamil 80 MG tablet Commonly known as: CALAN Replaced by: verapamil 240 MG CR tablet     TAKE these medications   aspirin EC 81 MG tablet Take 81-325 mg by mouth daily.   atorvastatin 80 MG tablet Commonly known as: LIPITOR Take 1 tablet (80 mg total) by mouth daily.   carvedilol 6.25 MG tablet Commonly known as: COREG Take 1 tablet (6.25 mg total) by mouth 2 (two) times daily with a meal.  hydrALAZINE 100 MG tablet Commonly known as: APRESOLINE Take 1 tablet (100 mg total) by mouth 3 (three) times daily. What changed:   medication strength  how much to take   insulin glargine 100 unit/mL Sopn Commonly known as: LANTUS Inject 0.35 mLs (35 Units total) into the skin daily.   ketorolac 0.5 % ophthalmic solution Commonly known as: ACULAR Place 1 drop into the left eye 4 (four) times daily.   ofloxacin 0.3 % ophthalmic solution Commonly known as: OCUFLOX Place 1 drop into the left eye 4 (four) times daily.   sitaGLIPtin 50 MG tablet Commonly known as: JANUVIA Take 1 tablet (50 mg total) by mouth daily.   verapamil 240 MG CR tablet Commonly known as: CALAN-SR Take 1 tablet (240 mg total) by mouth daily. Start taking on: October 23, 2018 Replaces: verapamil 80 MG tablet   VITAMIN B-12 CR PO Take 1 tablet by mouth daily.       No Known Allergies  Consultations:  none   Procedures/Studies: No results found.      Discharge Exam: Vitals:   10/22/18 0831 10/22/18 0941  BP: (!) 174/88   Pulse:    Resp:    Temp:    SpO2:  96%   Vitals:   10/21/18 2126 10/22/18 0622 10/22/18 0831 10/22/18 0941  BP: (!) 153/83 (!) 174/88 (!) 174/88   Pulse: (!) 57 66    Resp: 20 20    Temp: 98.2 F (36.8 C)     TempSrc: Oral     SpO2: 100% 98%  96%  Weight:  86 kg    Height:        General: Pt is alert, awake, not in acute distress Cardiovascular: RRR, S1/S2 +, no rubs, no gallops Respiratory: CTA bilaterally, no wheezing, no rhonchi Abdominal: Soft, NT, ND, bowel sounds + Extremities: no edema, no cyanosis   The results of significant diagnostics from this hospitalization (including imaging, microbiology, ancillary and laboratory) are listed below for reference.    Significant Diagnostic Studies: No results found.   Microbiology: Recent Results (from the past 240 hour(s))  Novel Coronavirus,NAA,(SEND-OUT TO REF LAB - [facility] 24-48 hrs); Hosp Order      Status: None   Collection Time: 10/18/18 12:40 AM   Specimen: Nasopharyngeal Swab; Respiratory  Result Value Ref Range Status   SARS-CoV-2, NAA NOT DETECTED NOT DETECTED Final    Comment: (NOTE) This test was developed and its performance characteristics determined by [employer]. This test has not been FDA cleared or approved. This test has been authorized by FDA under an Emergency Use Authorization (EUA). This test is only authorized for the duration of time the declaration that circumstances exist justifying the authorization of the emergency use of in vitro diagnostic tests for detection of SARS-CoV-2 virus and/or diagnosis of COVID-19 infection under section 564(b)(1) of the Act, 21 U.S.C. 017BLT-9(Q)(3), unless the authorization is terminated or revoked sooner. When diagnostic testing is negative, the possibility of a false negative result should be considered in the context of a patient's recent exposures and the presence of clinical signs and symptoms consistent with COVID-19. An individual without symptoms of COVID-19 and who is not shedding SARS-CoV-2 virus would expect to have a negative (not detected) result in this assay. Performed  At: [facility] [address] [place], [place] [id number] Rush Farmer MD MA:[id number]    Page  Final    Comment: Performed at [facility], [address] [place], [place] [address]  MRSA  PCR Screening     Status: None   Collection Time: 10/18/18  3:20 AM   Specimen: Nasal Mucosa; Nasopharyngeal  Result Value Ref Range Status   MRSA by PCR NEGATIVE NEGATIVE Final    Comment:        The GeneXpert MRSA Assay (FDA approved for NASAL specimens only), is one component of a comprehensive MRSA colonization surveillance program. It is not intended to diagnose MRSA infection nor to guide or monitor treatment for MRSA infections. Performed at [facility], [address], [place], [place]  [address]      Labs: Basic Metabolic Panel: Recent Labs  Lab 10/18/18 0352 10/18/18 0815 10/19/18 0710 10/20/18 0537 10/21/18 0550 10/22/18 0526  NA 136  --  131* 132* 134* 134*  K 3.4*  --  4.6 4.4 4.1 4.3  CL 104  --  103 104 105 109  CO2 23  --  21* 20* 21* 20*  GLUCOSE 245*  --  367* 237* 86 159*  BUN 52*  --  40* 37* 41* 43*  CREATININE 2.21*  --  1.96* 1.85* 2.10* 1.90*  CALCIUM 9.5  --  8.9 8.6* 8.9 8.4*  MG  --  2.2 1.9  --  1.7 2.1   Liver Function Tests: Recent Labs  Lab 10/17/18 2325 10/18/18 0352  AST 15 15  ALT 18 16  ALKPHOS 104 94  BILITOT 0.9 0.7  PROT 7.1 6.9  ALBUMIN 3.8 3.7   No results for input(s): LIPASE, AMYLASE in the last 168 hours. No results for input(s): AMMONIA in the last 168 hours. CBC: Recent Labs  Lab 10/17/18 2325 10/18/18 0352 10/19/18 0714  WBC 9.5 13.4* 8.8  NEUTROABS 6.7  --   --   HGB 11.9* 11.5* 11.1*  HCT 37.9 35.4* 35.3*  MCV 88.3 85.7 86.5  PLT 180 191 173   Cardiac Enzymes: Recent Labs  Lab 10/18/18 0815 10/18/18 1114 10/18/18 1645  TROPONINI 0.03* 0.03* 0.25*   BNP: Invalid input(s): POCBNP CBG: Recent Labs  Lab 10/21/18 1136 10/21/18 1608 10/21/18 2159 10/22/18 0722 10/22/18 1057  GLUCAP 209* 154* 107* 155* 168*    Time coordinating discharge:  36 minutes  Signed:  Orson Eva, DO [facility] Pager: [phone] 10/22/2018, 11:04 AM

## 2018-11-19 DIAGNOSIS — H2511 Age-related nuclear cataract, right eye: Secondary | ICD-10-CM | POA: Diagnosis not present

## 2018-11-25 ENCOUNTER — Other Ambulatory Visit: Payer: Self-pay

## 2018-11-25 DIAGNOSIS — N183 Chronic kidney disease, stage 3 (moderate): Secondary | ICD-10-CM | POA: Diagnosis not present

## 2018-11-25 DIAGNOSIS — N390 Urinary tract infection, site not specified: Secondary | ICD-10-CM | POA: Diagnosis not present

## 2018-11-25 DIAGNOSIS — E1122 Type 2 diabetes mellitus with diabetic chronic kidney disease: Secondary | ICD-10-CM | POA: Diagnosis not present

## 2018-11-25 DIAGNOSIS — Z0000 Encounter for general adult medical examination without abnormal findings: Secondary | ICD-10-CM | POA: Diagnosis not present

## 2018-11-25 DIAGNOSIS — I10 Essential (primary) hypertension: Secondary | ICD-10-CM | POA: Diagnosis not present

## 2018-11-25 DIAGNOSIS — Z7189 Other specified counseling: Secondary | ICD-10-CM | POA: Diagnosis not present

## 2018-11-25 NOTE — Patient Outreach (Signed)
[facility] [facility]) Care Management  11/25/2018  ARIONNA HOGGARD Dec 28, 1935 [id number]   Medication Adherence call to Mrs. Mee Hives patient telephone number belongs to someone else patient is showing past due under [facility].   [place] Management Direct Dial [phone]  Fax [phone] [email or web address]

## 2019-06-23 ENCOUNTER — Other Ambulatory Visit: Payer: Self-pay

## 2019-06-23 NOTE — Patient Outreach (Signed)
[place] [facility]) Care Management  06/23/2019  Chelsea Meza 1936/02/04 [id number]   Medication Adherence call to Chelsea Meza  Patient did not answer patient is due on Atorvastatin 40 mg and Glimepiride 4 mg under [facility].   [place] Management Direct Dial [phone]  Fax [phone] [email or web address]

## 2019-09-22 DIAGNOSIS — R000 Tachycardia, unspecified: Secondary | ICD-10-CM | POA: Diagnosis not present

## 2019-09-22 DIAGNOSIS — I10 Essential (primary) hypertension: Secondary | ICD-10-CM | POA: Diagnosis not present

## 2019-09-22 DIAGNOSIS — N183 Chronic kidney disease, stage 3 unspecified: Secondary | ICD-10-CM | POA: Diagnosis not present

## 2019-09-22 DIAGNOSIS — E1169 Type 2 diabetes mellitus with other specified complication: Secondary | ICD-10-CM | POA: Diagnosis not present

## 2019-09-22 DIAGNOSIS — E1122 Type 2 diabetes mellitus with diabetic chronic kidney disease: Secondary | ICD-10-CM | POA: Diagnosis not present

## 2019-10-07 ENCOUNTER — Telehealth: Payer: Self-pay

## 2019-10-07 DIAGNOSIS — R000 Tachycardia, unspecified: Secondary | ICD-10-CM

## 2019-10-07 NOTE — Telephone Encounter (Addendum)
Received fax request From Dr.Gerald Hill for 48 hr holter for tachycardia. DOD today is Dr.McDowell. Order scanned to Media    Zio XT to be mailed to patient home.Attempt to reach patient at home number, left message to call back.

## 2019-10-13 DIAGNOSIS — E1165 Type 2 diabetes mellitus with hyperglycemia: Secondary | ICD-10-CM | POA: Diagnosis not present

## 2019-10-13 DIAGNOSIS — I10 Essential (primary) hypertension: Secondary | ICD-10-CM | POA: Diagnosis not present

## 2019-10-13 DIAGNOSIS — N184 Chronic kidney disease, stage 4 (severe): Secondary | ICD-10-CM | POA: Diagnosis not present

## 2019-11-10 DIAGNOSIS — I10 Essential (primary) hypertension: Secondary | ICD-10-CM | POA: Diagnosis not present

## 2019-11-10 DIAGNOSIS — E1165 Type 2 diabetes mellitus with hyperglycemia: Secondary | ICD-10-CM | POA: Diagnosis not present

## 2019-11-10 DIAGNOSIS — E1169 Type 2 diabetes mellitus with other specified complication: Secondary | ICD-10-CM | POA: Diagnosis not present

## 2019-11-10 DIAGNOSIS — N183 Chronic kidney disease, stage 3 unspecified: Secondary | ICD-10-CM | POA: Diagnosis not present

## 2019-11-10 DIAGNOSIS — N184 Chronic kidney disease, stage 4 (severe): Secondary | ICD-10-CM | POA: Diagnosis not present

## 2019-11-24 DIAGNOSIS — Z794 Long term (current) use of insulin: Secondary | ICD-10-CM | POA: Diagnosis not present

## 2019-11-24 DIAGNOSIS — I10 Essential (primary) hypertension: Secondary | ICD-10-CM | POA: Diagnosis not present

## 2019-11-24 DIAGNOSIS — N184 Chronic kidney disease, stage 4 (severe): Secondary | ICD-10-CM | POA: Diagnosis not present

## 2019-11-24 DIAGNOSIS — E1122 Type 2 diabetes mellitus with diabetic chronic kidney disease: Secondary | ICD-10-CM | POA: Diagnosis not present

## 2019-12-05 DIAGNOSIS — D638 Anemia in other chronic diseases classified elsewhere: Secondary | ICD-10-CM | POA: Diagnosis not present

## 2019-12-05 DIAGNOSIS — E876 Hypokalemia: Secondary | ICD-10-CM | POA: Diagnosis not present

## 2019-12-05 DIAGNOSIS — N189 Chronic kidney disease, unspecified: Secondary | ICD-10-CM | POA: Diagnosis not present

## 2019-12-05 DIAGNOSIS — I129 Hypertensive chronic kidney disease with stage 1 through stage 4 chronic kidney disease, or unspecified chronic kidney disease: Secondary | ICD-10-CM | POA: Diagnosis not present

## 2019-12-05 DIAGNOSIS — E871 Hypo-osmolality and hyponatremia: Secondary | ICD-10-CM | POA: Diagnosis not present

## 2019-12-06 ENCOUNTER — Other Ambulatory Visit (HOSPITAL_COMMUNITY): Payer: Self-pay | Admitting: Nephrology

## 2019-12-06 DIAGNOSIS — Z79899 Other long term (current) drug therapy: Secondary | ICD-10-CM

## 2019-12-06 DIAGNOSIS — E871 Hypo-osmolality and hyponatremia: Secondary | ICD-10-CM

## 2019-12-06 DIAGNOSIS — R809 Proteinuria, unspecified: Secondary | ICD-10-CM

## 2019-12-06 DIAGNOSIS — D638 Anemia in other chronic diseases classified elsewhere: Secondary | ICD-10-CM

## 2019-12-06 DIAGNOSIS — E1122 Type 2 diabetes mellitus with diabetic chronic kidney disease: Secondary | ICD-10-CM

## 2019-12-06 DIAGNOSIS — I5032 Chronic diastolic (congestive) heart failure: Secondary | ICD-10-CM

## 2019-12-06 DIAGNOSIS — I129 Hypertensive chronic kidney disease with stage 1 through stage 4 chronic kidney disease, or unspecified chronic kidney disease: Secondary | ICD-10-CM

## 2019-12-06 DIAGNOSIS — E876 Hypokalemia: Secondary | ICD-10-CM

## 2019-12-12 ENCOUNTER — Other Ambulatory Visit: Payer: Self-pay

## 2019-12-12 ENCOUNTER — Ambulatory Visit (HOSPITAL_COMMUNITY)
Admission: RE | Admit: 2019-12-12 | Discharge: 2019-12-12 | Disposition: A | Discharge status: Home / Self Care | Payer: Medicare Other | Source: Ambulatory Visit | Attending: Nephrology | Admitting: Nephrology

## 2019-12-12 DIAGNOSIS — E871 Hypo-osmolality and hyponatremia: Secondary | ICD-10-CM | POA: Insufficient documentation

## 2019-12-12 DIAGNOSIS — I5032 Chronic diastolic (congestive) heart failure: Secondary | ICD-10-CM | POA: Insufficient documentation

## 2019-12-12 DIAGNOSIS — D638 Anemia in other chronic diseases classified elsewhere: Secondary | ICD-10-CM | POA: Diagnosis not present

## 2019-12-12 DIAGNOSIS — N189 Chronic kidney disease, unspecified: Secondary | ICD-10-CM | POA: Diagnosis not present

## 2019-12-12 DIAGNOSIS — E1122 Type 2 diabetes mellitus with diabetic chronic kidney disease: Secondary | ICD-10-CM | POA: Diagnosis not present

## 2019-12-12 DIAGNOSIS — Z79899 Other long term (current) drug therapy: Secondary | ICD-10-CM | POA: Diagnosis not present

## 2019-12-12 DIAGNOSIS — R809 Proteinuria, unspecified: Secondary | ICD-10-CM | POA: Insufficient documentation

## 2019-12-12 DIAGNOSIS — I129 Hypertensive chronic kidney disease with stage 1 through stage 4 chronic kidney disease, or unspecified chronic kidney disease: Secondary | ICD-10-CM | POA: Diagnosis not present

## 2019-12-12 DIAGNOSIS — E876 Hypokalemia: Secondary | ICD-10-CM | POA: Diagnosis not present

## 2019-12-12 DIAGNOSIS — E1129 Type 2 diabetes mellitus with other diabetic kidney complication: Secondary | ICD-10-CM | POA: Diagnosis not present

## 2019-12-12 DIAGNOSIS — E559 Vitamin D deficiency, unspecified: Secondary | ICD-10-CM | POA: Diagnosis not present

## 2020-01-08 DIAGNOSIS — I5032 Chronic diastolic (congestive) heart failure: Secondary | ICD-10-CM | POA: Diagnosis not present

## 2020-01-08 DIAGNOSIS — E559 Vitamin D deficiency, unspecified: Secondary | ICD-10-CM | POA: Diagnosis not present

## 2020-01-08 DIAGNOSIS — N189 Chronic kidney disease, unspecified: Secondary | ICD-10-CM | POA: Diagnosis not present

## 2020-01-08 DIAGNOSIS — R809 Proteinuria, unspecified: Secondary | ICD-10-CM | POA: Diagnosis not present

## 2020-01-08 DIAGNOSIS — D638 Anemia in other chronic diseases classified elsewhere: Secondary | ICD-10-CM | POA: Diagnosis not present

## 2020-01-12 DIAGNOSIS — Z794 Long term (current) use of insulin: Secondary | ICD-10-CM | POA: Diagnosis not present

## 2020-01-12 DIAGNOSIS — E1122 Type 2 diabetes mellitus with diabetic chronic kidney disease: Secondary | ICD-10-CM | POA: Diagnosis not present

## 2020-01-12 DIAGNOSIS — I10 Essential (primary) hypertension: Secondary | ICD-10-CM | POA: Diagnosis not present

## 2020-01-12 DIAGNOSIS — N184 Chronic kidney disease, stage 4 (severe): Secondary | ICD-10-CM | POA: Diagnosis not present

## 2020-03-09 DIAGNOSIS — E1122 Type 2 diabetes mellitus with diabetic chronic kidney disease: Secondary | ICD-10-CM | POA: Diagnosis not present

## 2020-03-09 DIAGNOSIS — M184 Other bilateral secondary osteoarthritis of first carpometacarpal joints: Secondary | ICD-10-CM | POA: Diagnosis not present

## 2020-03-09 DIAGNOSIS — I10 Essential (primary) hypertension: Secondary | ICD-10-CM | POA: Diagnosis not present

## 2020-04-04 ENCOUNTER — Other Ambulatory Visit: Payer: Self-pay

## 2020-04-04 ENCOUNTER — Inpatient Hospital Stay (HOSPITAL_COMMUNITY)
Admission: EM | Admit: 2020-04-04 | Discharge: 2020-04-08 | DRG: 871 | Disposition: A | Discharge status: Home Health | Payer: Medicare Other | Attending: Internal Medicine | Admitting: Internal Medicine

## 2020-04-04 DIAGNOSIS — E785 Hyperlipidemia, unspecified: Secondary | ICD-10-CM | POA: Diagnosis not present

## 2020-04-04 DIAGNOSIS — Z79899 Other long term (current) drug therapy: Secondary | ICD-10-CM | POA: Diagnosis not present

## 2020-04-04 DIAGNOSIS — E1110 Type 2 diabetes mellitus with ketoacidosis without coma: Secondary | ICD-10-CM | POA: Diagnosis not present

## 2020-04-04 DIAGNOSIS — G459 Transient cerebral ischemic attack, unspecified: Secondary | ICD-10-CM

## 2020-04-04 DIAGNOSIS — I440 Atrioventricular block, first degree: Secondary | ICD-10-CM | POA: Diagnosis present

## 2020-04-04 DIAGNOSIS — Z9114 Patient's other noncompliance with medication regimen: Secondary | ICD-10-CM

## 2020-04-04 DIAGNOSIS — N184 Chronic kidney disease, stage 4 (severe): Secondary | ICD-10-CM | POA: Diagnosis present

## 2020-04-04 DIAGNOSIS — N179 Acute kidney failure, unspecified: Secondary | ICD-10-CM | POA: Diagnosis not present

## 2020-04-04 DIAGNOSIS — I462 Cardiac arrest due to underlying cardiac condition: Secondary | ICD-10-CM | POA: Diagnosis present

## 2020-04-04 DIAGNOSIS — I10 Essential (primary) hypertension: Secondary | ICD-10-CM | POA: Diagnosis not present

## 2020-04-04 DIAGNOSIS — E861 Hypovolemia: Secondary | ICD-10-CM | POA: Diagnosis present

## 2020-04-04 DIAGNOSIS — I471 Supraventricular tachycardia: Secondary | ICD-10-CM | POA: Diagnosis not present

## 2020-04-04 DIAGNOSIS — H409 Unspecified glaucoma: Secondary | ICD-10-CM | POA: Diagnosis present

## 2020-04-04 DIAGNOSIS — Z9049 Acquired absence of other specified parts of digestive tract: Secondary | ICD-10-CM

## 2020-04-04 DIAGNOSIS — E0821 Diabetes mellitus due to underlying condition with diabetic nephropathy: Secondary | ICD-10-CM

## 2020-04-04 DIAGNOSIS — E872 Acidosis, unspecified: Secondary | ICD-10-CM | POA: Diagnosis present

## 2020-04-04 DIAGNOSIS — E1122 Type 2 diabetes mellitus with diabetic chronic kidney disease: Secondary | ICD-10-CM | POA: Diagnosis present

## 2020-04-04 DIAGNOSIS — E1165 Type 2 diabetes mellitus with hyperglycemia: Secondary | ICD-10-CM | POA: Diagnosis not present

## 2020-04-04 DIAGNOSIS — N185 Chronic kidney disease, stage 5: Secondary | ICD-10-CM | POA: Diagnosis not present

## 2020-04-04 DIAGNOSIS — I160 Hypertensive urgency: Secondary | ICD-10-CM | POA: Diagnosis not present

## 2020-04-04 DIAGNOSIS — Z20822 Contact with and (suspected) exposure to covid-19: Secondary | ICD-10-CM | POA: Diagnosis present

## 2020-04-04 DIAGNOSIS — I129 Hypertensive chronic kidney disease with stage 1 through stage 4 chronic kidney disease, or unspecified chronic kidney disease: Secondary | ICD-10-CM | POA: Diagnosis present

## 2020-04-04 DIAGNOSIS — R571 Hypovolemic shock: Secondary | ICD-10-CM | POA: Diagnosis not present

## 2020-04-04 DIAGNOSIS — Z794 Long term (current) use of insulin: Secondary | ICD-10-CM | POA: Diagnosis not present

## 2020-04-04 DIAGNOSIS — Z823 Family history of stroke: Secondary | ICD-10-CM

## 2020-04-04 DIAGNOSIS — I469 Cardiac arrest, cause unspecified: Secondary | ICD-10-CM

## 2020-04-04 DIAGNOSIS — N189 Chronic kidney disease, unspecified: Secondary | ICD-10-CM | POA: Diagnosis not present

## 2020-04-04 DIAGNOSIS — E871 Hypo-osmolality and hyponatremia: Secondary | ICD-10-CM | POA: Diagnosis present

## 2020-04-04 DIAGNOSIS — J9601 Acute respiratory failure with hypoxia: Secondary | ICD-10-CM | POA: Diagnosis not present

## 2020-04-04 DIAGNOSIS — D631 Anemia in chronic kidney disease: Secondary | ICD-10-CM | POA: Diagnosis not present

## 2020-04-04 DIAGNOSIS — E1100 Type 2 diabetes mellitus with hyperosmolarity without nonketotic hyperglycemic-hyperosmolar coma (NKHHC): Secondary | ICD-10-CM | POA: Diagnosis present

## 2020-04-04 DIAGNOSIS — I313 Pericardial effusion (noninflammatory): Secondary | ICD-10-CM | POA: Diagnosis present

## 2020-04-04 DIAGNOSIS — I472 Ventricular tachycardia: Secondary | ICD-10-CM | POA: Diagnosis not present

## 2020-04-04 DIAGNOSIS — Z743 Need for continuous supervision: Secondary | ICD-10-CM | POA: Diagnosis not present

## 2020-04-04 DIAGNOSIS — I358 Other nonrheumatic aortic valve disorders: Secondary | ICD-10-CM | POA: Diagnosis present

## 2020-04-04 DIAGNOSIS — I161 Hypertensive emergency: Secondary | ICD-10-CM | POA: Diagnosis present

## 2020-04-04 DIAGNOSIS — R531 Weakness: Secondary | ICD-10-CM | POA: Diagnosis not present

## 2020-04-04 DIAGNOSIS — R6889 Other general symptoms and signs: Secondary | ICD-10-CM | POA: Diagnosis not present

## 2020-04-04 DIAGNOSIS — R739 Hyperglycemia, unspecified: Secondary | ICD-10-CM

## 2020-04-04 DIAGNOSIS — E782 Mixed hyperlipidemia: Secondary | ICD-10-CM | POA: Diagnosis not present

## 2020-04-04 DIAGNOSIS — R001 Bradycardia, unspecified: Secondary | ICD-10-CM | POA: Diagnosis not present

## 2020-04-04 DIAGNOSIS — R29898 Other symptoms and signs involving the musculoskeletal system: Secondary | ICD-10-CM

## 2020-04-04 DIAGNOSIS — E860 Dehydration: Secondary | ICD-10-CM | POA: Diagnosis present

## 2020-04-04 DIAGNOSIS — Z7982 Long term (current) use of aspirin: Secondary | ICD-10-CM

## 2020-04-04 DIAGNOSIS — Z9119 Patient's noncompliance with other medical treatment and regimen: Secondary | ICD-10-CM

## 2020-04-04 DIAGNOSIS — Z8673 Personal history of transient ischemic attack (TIA), and cerebral infarction without residual deficits: Secondary | ICD-10-CM | POA: Diagnosis not present

## 2020-04-04 DIAGNOSIS — I120 Hypertensive chronic kidney disease with stage 5 chronic kidney disease or end stage renal disease: Secondary | ICD-10-CM | POA: Diagnosis not present

## 2020-04-04 LAB — CBC WITH DIFFERENTIAL/PLATELET
Basophils Absolute: 0 10*3/uL (ref 0.0–0.1)
Basophils Relative: 0 %
Eosinophils Absolute: 0.1 10*3/uL (ref 0.0–0.5)
Eosinophils Relative: 2 %
HCT: 39.2 % (ref 36.0–46.0)
Hemoglobin: 12.8 g/dL (ref 12.0–15.0)
Lymphocytes Relative: 30 %
Lymphs Abs: 2.3 10*3/uL (ref 0.7–4.0)
MCHC: 32.7 g/dL (ref 30.0–36.0)
MCV: 86.2 fL (ref 80.0–100.0)
Monocytes Absolute: 0.4 10*3/uL (ref 0.1–1.0)
Monocytes Relative: 5 %
Neutro Abs: 5 10*3/uL (ref 1.7–7.7)
Neutrophils Relative %: 63 %
Platelets: 203 10*3/uL (ref 150–400)
RBC: 4.55 MIL/uL (ref 3.87–5.11)
RDW: 12.5 % (ref 11.5–15.5)
WBC: 8.3 10*3/uL (ref 4.0–10.5)

## 2020-04-04 LAB — RESP PANEL BY RT-PCR (FLU A&B, COVID) ARPGX2
Influenza A by PCR: NEGATIVE
Influenza B by PCR: NEGATIVE
SARS Coronavirus 2 by RT PCR: NEGATIVE

## 2020-04-04 LAB — COMPREHENSIVE METABOLIC PANEL
ALT: 15 U/L (ref 0–44)
AST: 23 U/L (ref 15–41)
Albumin: 4.3 g/dL (ref 3.5–5.0)
Alkaline Phosphatase: 115 U/L (ref 38–126)
Anion gap: 15 (ref 5–15)
BUN: 126 mg/dL — ABNORMAL HIGH (ref 8–23)
CO2: 24 mmol/L (ref 22–32)
Calcium: 9.8 mg/dL (ref 8.9–10.3)
Chloride: 80 mmol/L — ABNORMAL LOW (ref 98–111)
Creatinine, Ser: 5.19 mg/dL — ABNORMAL HIGH (ref 0.44–1.00)
GFR, Estimated: 8 mL/min — ABNORMAL LOW (ref >60)
Glucose, Bld: 802 mg/dL (ref 70–99)
Potassium: 4.7 mmol/L (ref 3.5–5.1)
Sodium: 119 mmol/L — CL (ref 135–145)
Total Bilirubin: 0.7 mg/dL (ref 0.3–1.2)
Total Protein: 8.2 g/dL — ABNORMAL HIGH (ref 6.5–8.1)

## 2020-04-04 LAB — CBG MONITORING, ED: Glucose-Capillary: >600 mg/dL (ref 70–99)

## 2020-04-04 MED ORDER — CARVEDILOL 12.5 MG PO TABS
12.5000 mg | ORAL_TABLET | Freq: Once | ORAL | Status: AC
  Administered 2020-04-05: 12.5 mg via ORAL
  Filled 2020-04-04: qty 1

## 2020-04-04 MED ORDER — HYDRALAZINE HCL 25 MG PO TABS
100.0000 mg | ORAL_TABLET | Freq: Once | ORAL | Status: AC
  Administered 2020-04-05: 100 mg via ORAL
  Filled 2020-04-04: qty 4

## 2020-04-04 MED ORDER — VERAPAMIL HCL 80 MG PO TABS
80.0000 mg | ORAL_TABLET | Freq: Once | ORAL | Status: AC
  Administered 2020-04-05: 80 mg via ORAL
  Filled 2020-04-04 (×2): qty 1

## 2020-04-04 NOTE — ED Notes (Signed)
Pt states her sugar has been elevated since thanksgiving. Said she had a lot of thanksgiving meals; including chocolate pie.  She also said that it is "normal" for her blood pressure to be as elevated as it is. States that they have trouble getting it down .

## 2020-04-04 NOTE — ED Provider Notes (Incomplete)
[facility] EMERGENCY DEPARTMENT Provider Note   CSN: [id number] Arrival date & time: 04/04/20  2212     History Chief Complaint  Patient presents with  . Hypertension  . Hyperglycemia    Chelsea Meza is a 84 y.o. female.  HPI     Past Medical History:  Diagnosis Date  . Anemia   . CKD (chronic kidney disease) stage 3, GFR 30-59 ml/min ([name])   . Diabetes mellitus without complication ([place])   . Hyperlipidemia   . Hypertension   . TIA (transient ischemic attack)     Patient Active Problem List   Diagnosis Date Noted  . Acute CHF (congestive heart failure) ([place]) 10/18/2018  . Hyperosmolar non-ketotic state in patient with type 2 diabetes mellitus ([place]) 10/18/2018  . TIA (transient ischemic attack) 11/05/2017  . Hypokalemia 11/05/2017  . Hyponatremia 03/20/2015  . AKI (acute kidney injury) ([place]) 03/20/2015  . CKD (chronic kidney disease) stage 3, GFR 30-59 ml/min ([name]) 03/20/2015  . Hyperglycemia 03/20/2015  . HLD (hyperlipidemia) 03/20/2015  . Diabetes mellitus due to underlying condition with diabetic nephropathy ([place]) 03/20/2015  . Diabetic hyperosmolar non-ketotic state ([place]) 07/14/2012  . Lactic acidosis 07/14/2012  . DKA, type 2 ([place]) 07/13/2012  . Acute renal failure superimposed on stage 3 chronic kidney disease ([place]) 07/13/2012  . PSVT (paroxysmal supraventricular tachycardia) ([place]) 07/13/2012  . Weakness of both legs 07/13/2012  . Paresthesia of left leg 07/13/2012  . Cough 07/13/2012  . Essential hypertension, benign 07/13/2012    Past Surgical History:  Procedure Laterality Date  . CHOLECYSTECTOMY       OB History   No obstetric history on file.     Family History  Problem Relation Age of Onset  . Stroke Mother   . Stroke Maternal Grandmother     Social History   Tobacco Use  . Smoking status: Never Smoker  . Smokeless tobacco: Never Used  Substance Use Topics  . Alcohol use: No  . Drug use: No    Home Medications Prior to  Admission medications   Medication Sig Start Date End Date Taking? Authorizing Provider  aspirin EC 81 MG tablet Take 81-325 mg by mouth daily.     [provider]  atorvastatin (LIPITOR) 80 MG tablet Take 1 tablet (80 mg total) by mouth daily. 11/07/17   Orson Eva, MD  carvedilol (COREG) 6.25 MG tablet Take 1 tablet (6.25 mg total) by mouth 2 (two) times daily with a meal. 10/22/18   Tat, Shanon Brow, MD  Cyanocobalamin (VITAMIN B-12 CR PO) Take 1 tablet by mouth daily.    [provider]  hydrALAZINE (APRESOLINE) 100 MG tablet Take 1 tablet (100 mg total) by mouth 3 (three) times daily. 10/22/18   Orson Eva, MD  insulin glargine (LANTUS) 100 unit/mL SOPN Inject 0.35 mLs (35 Units total) into the skin daily. 10/22/18   Orson Eva, MD  ketorolac (ACULAR) 0.5 % ophthalmic solution Place 1 drop into the left eye 4 (four) times daily.  09/17/18   [provider]  ofloxacin (OCUFLOX) 0.3 % ophthalmic solution Place 1 drop into the left eye 4 (four) times daily. 09/16/18   [provider]  sitaGLIPtin (JANUVIA) 50 MG tablet Take 1 tablet (50 mg total) by mouth daily. 10/22/18   Orson Eva, MD  verapamil (CALAN-SR) 240 MG CR tablet Take 1 tablet (240 mg total) by mouth daily. 10/23/18   Orson Eva, MD    Allergies    Patient has no known allergies.  Review of Systems   Review of Systems  Physical Exam Updated Vital Signs BP (!) 229/107   Pulse 71   Temp 98.6 F (37 C)   Resp (!) 22   Ht 5\' 9"  (1.753 m)   Wt 86 kg   SpO2 100%   BMI 28.00 kg/m   Physical Exam  ED Results / Procedures / Treatments   Labs (all labs ordered are listed, but only abnormal results are displayed) Labs Reviewed  CBG MONITORING, ED - Abnormal; Notable for the following components:      Result Value   Glucose-Capillary >600 (*)    All other components within normal limits  RESP PANEL BY RT-PCR (FLU A&B, COVID) ARPGX2  CBC WITH DIFFERENTIAL/PLATELET  COMPREHENSIVE METABOLIC PANEL     EKG EKG Interpretation  Date/Time:  Sunday April 04 2020 22:21:35 EST Ventricular Rate:  85 PR Interval:    QRS Duration: 111 QT Interval:  372 QTC Calculation: 424 R Axis:   11 Text Interpretation: Sinus rhythm Ventricular premature complex Short PR interval Left ventricular hypertrophy Anterior Q waves, possibly due to LVH baseline wander Confirmed by Rolland Porter [phone]) on 04/04/2020 11:03:05 PM   EKG Interpretation  Date/Time:  Sunday April 04 2020 22:27:18 EST Ventricular Rate:  77 PR Interval:    QRS Duration: 114 QT Interval:  375 QTC Calculation: 425 R Axis:   7 Text Interpretation: Sinus arrhythmia Prolonged PR interval Left ventricular hypertrophy Anterior Q waves, possibly due to LVH Since last tracing rate slower 16 Nov 2007 Confirmed by Rolland Porter [phone]) on 04/04/2020 11:04:16 PM       Radiology No results found.  Procedures Procedures (including critical care time)  Medications Ordered in ED Medications - No data to display  ED Course  I have reviewed the triage vital signs and the nursing notes.  Pertinent labs & imaging results that were available during my care of the patient were reviewed by me and considered in my medical decision making (see chart for details).    MDM Rules/Calculators/A&P                          *** Final Clinical Impression(s) / ED Diagnoses Final diagnoses:  None    Rx / DC Orders ED Discharge Orders    None

## 2020-04-04 NOTE — ED Provider Notes (Addendum)
[facility] EMERGENCY DEPARTMENT Provider Note   CSN: [id number] Arrival date & time: 04/04/20  2212   Time seen 11:19 PM  History Chief Complaint  Patient presents with  . Hypertension  . Hyperglycemia    Chelsea Meza is a 84 y.o. female.  HPI   Patient and daughter both agree that patient's blood sugars have been reading high since around Thanksgiving.  However yesterday she states her leg started getting weak.  She denies feeling thirsty, having a dry mouth, feeling short of breath, having nausea or vomiting.  She states however she has some mild increased urination.  When asked about her blood pressure patient states is normally over 200 however her daughter states it is normally 160/90.  Daughter also states patient ran out of her medication for about 2 weeks and there was a miscommunication between the pharmacy and her doctor's office however she did start taking it yesterday or the day before.  The daughter states she was getting samples of Toujeo however when they sent her new insulin it is a different brand.  Daughter feels like she is not getting enough insulin.  PCP Iona Beard, MD   Past Medical History:  Diagnosis Date  . Anemia   . CKD (chronic kidney disease) stage 3, GFR 30-59 ml/min ([name])   . Diabetes mellitus without complication ([place])   . Hyperlipidemia   . Hypertension   . TIA (transient ischemic attack)     Patient Active Problem List   Diagnosis Date Noted  . Acute CHF (congestive heart failure) ([place]) 10/18/2018  . Hyperosmolar non-ketotic state in patient with type 2 diabetes mellitus ([place]) 10/18/2018  . TIA (transient ischemic attack) 11/05/2017  . Hypokalemia 11/05/2017  . Hyponatremia 03/20/2015  . AKI (acute kidney injury) ([place]) 03/20/2015  . CKD (chronic kidney disease) stage 3, GFR 30-59 ml/min ([name]) 03/20/2015  . Hyperglycemia 03/20/2015  . HLD (hyperlipidemia) 03/20/2015  . Diabetes mellitus due to underlying condition with diabetic  nephropathy ([place]) 03/20/2015  . Diabetic hyperosmolar non-ketotic state ([place]) 07/14/2012  . Lactic acidosis 07/14/2012  . DKA, type 2 ([place]) 07/13/2012  . Acute renal failure superimposed on stage 3 chronic kidney disease ([place]) 07/13/2012  . PSVT (paroxysmal supraventricular tachycardia) ([place]) 07/13/2012  . Weakness of both legs 07/13/2012  . Paresthesia of left leg 07/13/2012  . Cough 07/13/2012  . Essential hypertension, benign 07/13/2012    Past Surgical History:  Procedure Laterality Date  . CHOLECYSTECTOMY       OB History   No obstetric history on file.     Family History  Problem Relation Age of Onset  . Stroke Mother   . Stroke Maternal Grandmother     Social History   Tobacco Use  . Smoking status: Never Smoker  . Smokeless tobacco: Never Used  Substance Use Topics  . Alcohol use: No  . Drug use: No    Home Medications Prior to Admission medications   Medication Sig Start Date End Date Taking? Authorizing Provider  aspirin EC 81 MG tablet Take 81-325 mg by mouth daily.     [provider]  atorvastatin (LIPITOR) 80 MG tablet Take 1 tablet (80 mg total) by mouth daily. 11/07/17   Orson Eva, MD  carvedilol (COREG) 6.25 MG tablet Take 1 tablet (6.25 mg total) by mouth 2 (two) times daily with a meal. 10/22/18   Tat, Shanon Brow, MD  Cyanocobalamin (VITAMIN B-12 CR PO) Take 1 tablet by mouth daily.    [provider]  hydrALAZINE (APRESOLINE) 100 MG tablet Take 1 tablet (100 mg total) by mouth 3 (three) times daily. 10/22/18   Orson Eva, MD  insulin glargine (LANTUS) 100 unit/mL SOPN Inject 0.35 mLs (35 Units total) into the skin daily. 10/22/18   Orson Eva, MD  ketorolac (ACULAR) 0.5 % ophthalmic solution Place 1 drop into the left eye 4 (four) times daily.  09/17/18   [provider]  ofloxacin (OCUFLOX) 0.3 % ophthalmic solution Place 1 drop into the left eye 4 (four) times daily. 09/16/18   [provider]  sitaGLIPtin (JANUVIA) 50  MG tablet Take 1 tablet (50 mg total) by mouth daily. 10/22/18   Orson Eva, MD  verapamil (CALAN-SR) 240 MG CR tablet Take 1 tablet (240 mg total) by mouth daily. 10/23/18   Orson Eva, MD  Patient takes the atorvastatin, verapamil 80 mg 3 times a day, hydralazine 100 mg 3 times a day, Carver Dyal 12.5 mg daily and spironolactone 25 mg daily.  She has a prescription of Lasix 40 mg tablets which her nephrologist has told her not to start yet.  She is now on the Lantus insulin.  She does not take Januvia.  Allergies    Patient has no known allergies.  Review of Systems   Review of Systems  All other systems reviewed and are negative.   Physical Exam Updated Vital Signs BP (!) 202/101   Pulse 72   Temp 98.6 F (37 C)   Resp 20   Ht 5\' 9"  (1.753 m)   Wt 86 kg   SpO2 100%   BMI 28.00 kg/m   Physical Exam Vitals and nursing note reviewed.  Constitutional:      General: She is not in acute distress.    Appearance: Normal appearance. She is normal weight.  HENT:     Head: Normocephalic and atraumatic.     Nose: Nose normal.     Mouth/Throat:     Mouth: Mucous membranes are dry.  Eyes:     Extraocular Movements: Extraocular movements intact.     Conjunctiva/sclera: Conjunctivae normal.     Pupils: Pupils are equal, round, and reactive to light.  Cardiovascular:     Rate and Rhythm: Normal rate and regular rhythm.     Pulses: Normal pulses.     Heart sounds: Normal heart sounds.  Pulmonary:     Effort: Pulmonary effort is normal. No respiratory distress.     Breath sounds: Normal breath sounds.  Abdominal:     General: Abdomen is flat. Bowel sounds are normal.     Palpations: Abdomen is soft.     Tenderness: There is no abdominal tenderness.  Musculoskeletal:        General: Normal range of motion.     Cervical back: Normal range of motion and neck supple.  Skin:    General: Skin is warm and dry.  Neurological:     General: No focal deficit present.     Mental Status:  She is alert and oriented to person, place, and time.     Cranial Nerves: No cranial nerve deficit.  Psychiatric:        Mood and Affect: Mood normal.        Behavior: Behavior normal.        Thought Content: Thought content normal.     ED Results / Procedures / Treatments   Labs (all labs ordered are listed, but only abnormal results are displayed) Results for orders placed or performed during the hospital  encounter of 04/04/20  Resp Panel by RT-PCR (Flu A&B, Covid) Nasopharyngeal Swab   Specimen: Nasopharyngeal Swab; Nasopharyngeal(NP) swabs in vial transport medium  Result Value Ref Range   SARS Coronavirus 2 by RT PCR NEGATIVE NEGATIVE   Influenza A by PCR NEGATIVE NEGATIVE   Influenza B by PCR NEGATIVE NEGATIVE  CBC with Differential  Result Value Ref Range   WBC 8.3 4.0 - 10.5 K/uL   RBC 4.55 3.87 - 5.11 MIL/uL   Hemoglobin 12.8 12.0 - 15.0 g/dL   HCT 39.2 36 - 46 %   MCV 86.2 80.0 - 100.0 fL   MCHC 32.7 30.0 - 36.0 g/dL   RDW 12.5 11.5 - 15.5 %   Platelets 203 150 - 400 K/uL   Neutrophils Relative % 63 %   Neutro Abs 5.0 1.7 - 7.7 K/uL   Lymphocytes Relative 30 %   Lymphs Abs 2.3 0.7 - 4.0 K/uL   Monocytes Relative 5 %   Monocytes Absolute 0.4 0.1 - 1.0 K/uL   Eosinophils Relative 2 %   Eosinophils Absolute 0.1 0.0 - 0.5 K/uL   Basophils Relative 0 %   Basophils Absolute 0.0 0.0 - 0.1 K/uL  Comprehensive metabolic panel  Result Value Ref Range   Sodium 119 (LL) 135 - 145 mmol/L   Potassium 4.7 3.5 - 5.1 mmol/L   Chloride 80 (L) 98 - 111 mmol/L   CO2 24 22 - 32 mmol/L   Glucose, Bld 802 (HH) 70 - 99 mg/dL   BUN 126 (H) 8 - 23 mg/dL   Creatinine, Ser 5.19 (H) 0.44 - 1.00 mg/dL   Calcium 9.8 8.9 - 10.3 mg/dL   Total Protein 8.2 (H) 6.5 - 8.1 g/dL   Albumin 4.3 3.5 - 5.0 g/dL   AST 23 15 - 41 U/L   ALT 15 0 - 44 U/L   Alkaline Phosphatase 115 38 - 126 U/L   Total Bilirubin 0.7 0.3 - 1.2 mg/dL   GFR, Estimated 8 (L) >60 mL/min   Anion gap 15 5 - 15  CBG  monitoring, ED  Result Value Ref Range   Glucose-Capillary >600 (HH) 70 - 99 mg/dL   Laboratory interpretation all normal except hyperglycemia without metabolic acidosis, acute on chronic renal failure worse than a year ago    EKG EKG Interpretation  Date/Time:  Sunday April 04 2020 22:27:18 EST Ventricular Rate:  77 PR Interval:    QRS Duration: 114 QT Interval:  375 QTC Calculation: 425 R Axis:   7 Text Interpretation: Sinus arrhythmia Prolonged PR interval Left ventricular hypertrophy Anterior Q waves, possibly due to LVH Since last tracing rate slower 16 Nov 2007 Confirmed by Rolland Porter [phone]) on 04/04/2020 11:04:16 PM   Radiology No results found.  Procedures .Critical Care Performed by: Rolland Porter, MD Authorized by: Rolland Porter, MD   Critical care provider statement:    Critical care time (minutes):  39   Critical care was necessary to treat or prevent imminent or life-threatening deterioration of the following conditions:  Endocrine crisis   Critical care was time spent personally by me on the following activities:  Discussions with consultants, examination of patient, obtaining history from patient or surrogate, ordering and review of laboratory studies, re-evaluation of patient's condition and review of old charts   (including critical care time)  Medications Ordered in ED Medications  verapamil (CALAN) tablet 80 mg (has no administration in time range)  insulin regular, human (MYXREDLIN) 100 units/ 100 mL infusion (11 Units/hr Intravenous  New Bag/Given 04/05/20 0030)  lactated ringers infusion (has no administration in time range)  dextrose 5 % in lactated ringers infusion (0 mLs Intravenous Hold 04/05/20 0002)  dextrose 50 % solution 0-50 mL (has no administration in time range)  potassium chloride 10 mEq in 100 mL IVPB (10 mEq Intravenous New Bag/Given (Non-Interop) 04/05/20 0033)  hydrALAZINE (APRESOLINE) tablet 100 mg (100 mg Oral Given 04/05/20 0028)   carvedilol (COREG) tablet 12.5 mg (12.5 mg Oral Given 04/05/20 0029)  lactated ringers bolus 1,000 mL (1,000 mLs Intravenous New Bag/Given (Non-Interop) 04/05/20 1017)    ED Course  I have reviewed the triage vital signs and the nursing notes.  Pertinent labs & imaging results that were available during my care of the patient were reviewed by me and considered in my medical decision making (see chart for details).    MDM Rules/Calculators/A&P                          At the time of my exam patient's blood pressure is over 200.  She was given her evening dose of her medications which she has not had had this evening according to her daughter, verapamil 80 mg orally, hydralazine 100 mg orally and Coreg 12.5 mg.  Daughter went out to the car and got her pill bottles to verify what medication she is on in her doses.  Patient sodium corrected for her hyperglycemia is actually 130.  Patient's labs have resulted and she has a blood glucose of 510 without metabolic acidosis.  Patient was given IV fluids and started on IV insulin.  I will talk to the hospitalist about admission.  12:39 AM Dr. Josephine Cables, hospitalist will admit  Final Clinical Impression(s) / ED Diagnoses Final diagnoses:  Primary hypertension  AKI (acute kidney injury) ([place])  Hyperglycemia without ketosis    Rx / DC Orders  Plan admission  Rolland Porter, MD, Barbette Or, MD 04/05/20 [place], [place], MD 04/05/20 [phone]

## 2020-04-04 NOTE — ED Triage Notes (Addendum)
Pt [facility] from [place]  Hyperglycemia and hypertension  >600 on  EMS glucometer

## 2020-04-04 NOTE — ED Notes (Signed)
Date and time results received: 04/04/20 2351 (use smartphrase ".now" to insert current time)  Test: Sodium Critical Value: 119  Glucose: 802  Name of Provider Notified: Tomi Bamberger, MD  Orders Received? Or Actions Taken?:

## 2020-04-05 ENCOUNTER — Inpatient Hospital Stay (HOSPITAL_COMMUNITY): Payer: Medicare Other

## 2020-04-05 ENCOUNTER — Observation Stay (HOSPITAL_COMMUNITY): Payer: Medicare Other

## 2020-04-05 DIAGNOSIS — I120 Hypertensive chronic kidney disease with stage 5 chronic kidney disease or end stage renal disease: Secondary | ICD-10-CM | POA: Diagnosis present

## 2020-04-05 DIAGNOSIS — I161 Hypertensive emergency: Secondary | ICD-10-CM

## 2020-04-05 DIAGNOSIS — Z823 Family history of stroke: Secondary | ICD-10-CM | POA: Diagnosis not present

## 2020-04-05 DIAGNOSIS — E1100 Type 2 diabetes mellitus with hyperosmolarity without nonketotic hyperglycemic-hyperosmolar coma (NKHHC): Secondary | ICD-10-CM | POA: Diagnosis present

## 2020-04-05 DIAGNOSIS — I129 Hypertensive chronic kidney disease with stage 1 through stage 4 chronic kidney disease, or unspecified chronic kidney disease: Secondary | ICD-10-CM | POA: Diagnosis present

## 2020-04-05 DIAGNOSIS — I469 Cardiac arrest, cause unspecified: Secondary | ICD-10-CM | POA: Diagnosis not present

## 2020-04-05 DIAGNOSIS — E782 Mixed hyperlipidemia: Secondary | ICD-10-CM | POA: Diagnosis not present

## 2020-04-05 DIAGNOSIS — Z9049 Acquired absence of other specified parts of digestive tract: Secondary | ICD-10-CM | POA: Diagnosis not present

## 2020-04-05 DIAGNOSIS — E861 Hypovolemia: Secondary | ICD-10-CM

## 2020-04-05 DIAGNOSIS — E785 Hyperlipidemia, unspecified: Secondary | ICD-10-CM | POA: Diagnosis present

## 2020-04-05 DIAGNOSIS — Z79899 Other long term (current) drug therapy: Secondary | ICD-10-CM | POA: Diagnosis not present

## 2020-04-05 DIAGNOSIS — E871 Hypo-osmolality and hyponatremia: Secondary | ICD-10-CM | POA: Diagnosis present

## 2020-04-05 DIAGNOSIS — I10 Essential (primary) hypertension: Secondary | ICD-10-CM

## 2020-04-05 DIAGNOSIS — R001 Bradycardia, unspecified: Secondary | ICD-10-CM | POA: Diagnosis present

## 2020-04-05 DIAGNOSIS — Z20822 Contact with and (suspected) exposure to covid-19: Secondary | ICD-10-CM | POA: Diagnosis present

## 2020-04-05 DIAGNOSIS — E1122 Type 2 diabetes mellitus with diabetic chronic kidney disease: Secondary | ICD-10-CM | POA: Diagnosis present

## 2020-04-05 DIAGNOSIS — Z794 Long term (current) use of insulin: Secondary | ICD-10-CM | POA: Diagnosis not present

## 2020-04-05 DIAGNOSIS — N185 Chronic kidney disease, stage 5: Secondary | ICD-10-CM | POA: Diagnosis present

## 2020-04-05 DIAGNOSIS — E1110 Type 2 diabetes mellitus with ketoacidosis without coma: Secondary | ICD-10-CM | POA: Diagnosis not present

## 2020-04-05 DIAGNOSIS — Z8673 Personal history of transient ischemic attack (TIA), and cerebral infarction without residual deficits: Secondary | ICD-10-CM | POA: Diagnosis not present

## 2020-04-05 DIAGNOSIS — R571 Hypovolemic shock: Secondary | ICD-10-CM | POA: Diagnosis not present

## 2020-04-05 DIAGNOSIS — E1165 Type 2 diabetes mellitus with hyperglycemia: Secondary | ICD-10-CM

## 2020-04-05 DIAGNOSIS — I160 Hypertensive urgency: Secondary | ICD-10-CM | POA: Diagnosis present

## 2020-04-05 DIAGNOSIS — I313 Pericardial effusion (noninflammatory): Secondary | ICD-10-CM | POA: Diagnosis present

## 2020-04-05 DIAGNOSIS — N179 Acute kidney failure, unspecified: Secondary | ICD-10-CM | POA: Diagnosis not present

## 2020-04-05 DIAGNOSIS — I471 Supraventricular tachycardia: Secondary | ICD-10-CM | POA: Diagnosis not present

## 2020-04-05 DIAGNOSIS — E872 Acidosis: Secondary | ICD-10-CM

## 2020-04-05 DIAGNOSIS — I462 Cardiac arrest due to underlying cardiac condition: Secondary | ICD-10-CM | POA: Diagnosis not present

## 2020-04-05 DIAGNOSIS — J9601 Acute respiratory failure with hypoxia: Secondary | ICD-10-CM | POA: Diagnosis not present

## 2020-04-05 DIAGNOSIS — D631 Anemia in chronic kidney disease: Secondary | ICD-10-CM | POA: Diagnosis present

## 2020-04-05 DIAGNOSIS — I472 Ventricular tachycardia: Secondary | ICD-10-CM | POA: Diagnosis present

## 2020-04-05 DIAGNOSIS — N184 Chronic kidney disease, stage 4 (severe): Secondary | ICD-10-CM | POA: Diagnosis present

## 2020-04-05 DIAGNOSIS — N189 Chronic kidney disease, unspecified: Secondary | ICD-10-CM

## 2020-04-05 LAB — COMPREHENSIVE METABOLIC PANEL
ALT: 22 U/L (ref 0–44)
AST: 37 U/L (ref 15–41)
Albumin: 3.7 g/dL (ref 3.5–5.0)
Alkaline Phosphatase: 103 U/L (ref 38–126)
Anion gap: 16 — ABNORMAL HIGH (ref 5–15)
BUN: 117 mg/dL — ABNORMAL HIGH (ref 8–23)
CO2: 19 mmol/L — ABNORMAL LOW (ref 22–32)
Calcium: 9.2 mg/dL (ref 8.9–10.3)
Chloride: 92 mmol/L — ABNORMAL LOW (ref 98–111)
Creatinine, Ser: 4.74 mg/dL — ABNORMAL HIGH (ref 0.44–1.00)
GFR, Estimated: 9 mL/min — ABNORMAL LOW (ref >60)
Glucose, Bld: 277 mg/dL — ABNORMAL HIGH (ref 70–99)
Potassium: 3.9 mmol/L (ref 3.5–5.1)
Sodium: 127 mmol/L — ABNORMAL LOW (ref 135–145)
Total Bilirubin: 0.8 mg/dL (ref 0.3–1.2)
Total Protein: 7.3 g/dL (ref 6.5–8.1)

## 2020-04-05 LAB — BASIC METABOLIC PANEL
Anion gap: 14 (ref 5–15)
Anion gap: 15 (ref 5–15)
Anion gap: 12 (ref 5–15)
BUN: 109 mg/dL — ABNORMAL HIGH (ref 8–23)
BUN: 114 mg/dL — ABNORMAL HIGH (ref 8–23)
BUN: 100 mg/dL — ABNORMAL HIGH (ref 8–23)
CO2: 21 mmol/L — ABNORMAL LOW (ref 22–32)
CO2: 18 mmol/L — ABNORMAL LOW (ref 22–32)
CO2: 22 mmol/L (ref 22–32)
Calcium: 9.6 mg/dL (ref 8.9–10.3)
Calcium: 8.9 mg/dL (ref 8.9–10.3)
Calcium: 9.1 mg/dL (ref 8.9–10.3)
Chloride: 85 mmol/L — ABNORMAL LOW (ref 98–111)
Chloride: 97 mmol/L — ABNORMAL LOW (ref 98–111)
Chloride: 99 mmol/L (ref 98–111)
Creatinine, Ser: 4.83 mg/dL — ABNORMAL HIGH (ref 0.44–1.00)
Creatinine, Ser: 4.69 mg/dL — ABNORMAL HIGH (ref 0.44–1.00)
Creatinine, Ser: 4.07 mg/dL — ABNORMAL HIGH (ref 0.44–1.00)
GFR, Estimated: 8 mL/min — ABNORMAL LOW (ref >60)
GFR, Estimated: 9 mL/min — ABNORMAL LOW (ref >60)
GFR, Estimated: 10 mL/min — ABNORMAL LOW (ref >60)
Glucose, Bld: 624 mg/dL (ref 70–99)
Glucose, Bld: 155 mg/dL — ABNORMAL HIGH (ref 70–99)
Glucose, Bld: 71 mg/dL (ref 70–99)
Potassium: 4.1 mmol/L (ref 3.5–5.1)
Potassium: 4.8 mmol/L (ref 3.5–5.1)
Potassium: 3.9 mmol/L (ref 3.5–5.1)
Sodium: 120 mmol/L — ABNORMAL LOW (ref 135–145)
Sodium: 130 mmol/L — ABNORMAL LOW (ref 135–145)
Sodium: 133 mmol/L — ABNORMAL LOW (ref 135–145)

## 2020-04-05 LAB — LACTIC ACID, PLASMA
Lactic Acid, Venous: 2.8 mmol/L (ref 0.5–1.9)
Lactic Acid, Venous: 3.5 mmol/L (ref 0.5–1.9)
Lactic Acid, Venous: 4.8 mmol/L (ref 0.5–1.9)

## 2020-04-05 LAB — OSMOLALITY: Osmolality: 347 mOsm/kg (ref 275–295)

## 2020-04-05 LAB — GLUCOSE, CAPILLARY
Glucose-Capillary: 338 mg/dL — ABNORMAL HIGH (ref 70–99)
Glucose-Capillary: 254 mg/dL — ABNORMAL HIGH (ref 70–99)
Glucose-Capillary: 232 mg/dL — ABNORMAL HIGH (ref 70–99)
Glucose-Capillary: 174 mg/dL — ABNORMAL HIGH (ref 70–99)
Glucose-Capillary: 154 mg/dL — ABNORMAL HIGH (ref 70–99)
Glucose-Capillary: 64 mg/dL — ABNORMAL LOW (ref 70–99)
Glucose-Capillary: 87 mg/dL (ref 70–99)
Glucose-Capillary: 49 mg/dL — ABNORMAL LOW (ref 70–99)
Glucose-Capillary: 65 mg/dL — ABNORMAL LOW (ref 70–99)

## 2020-04-05 LAB — MRSA PCR SCREENING: MRSA by PCR: NEGATIVE

## 2020-04-05 LAB — APTT: aPTT: 28 seconds (ref 24–36)

## 2020-04-05 LAB — ECHOCARDIOGRAM COMPLETE
AR max vel: 1.94 cm2
AV Area VTI: 2.08 cm2
AV Area mean vel: 2.01 cm2
AV Mean grad: 4.1 mmHg
AV Peak grad: 7.9 mmHg
Ao pk vel: 1.41 m/s
Area-P 1/2: 1.86 cm2
Height: 69 in
S' Lateral: 2.1 cm
Weight: 2447.99 oz

## 2020-04-05 LAB — CBC
HCT: 36.4 % (ref 36.0–46.0)
Hemoglobin: 11.7 g/dL — ABNORMAL LOW (ref 12.0–15.0)
MCH: 27.9 pg (ref 26.0–34.0)
MCHC: 32.1 g/dL (ref 30.0–36.0)
MCV: 86.9 fL (ref 80.0–100.0)
Platelets: 195 10*3/uL (ref 150–400)
RBC: 4.19 MIL/uL (ref 3.87–5.11)
RDW: 11.9 % (ref 11.5–15.5)
WBC: 11.8 10*3/uL — ABNORMAL HIGH (ref 4.0–10.5)
nRBC: 0 % (ref 0.0–0.2)

## 2020-04-05 LAB — CBG MONITORING, ED
Glucose-Capillary: 426 mg/dL — ABNORMAL HIGH (ref 70–99)
Glucose-Capillary: 534 mg/dL (ref 70–99)

## 2020-04-05 LAB — PROTIME-INR
INR: 1 (ref 0.8–1.2)
Prothrombin Time: 13.2 seconds (ref 11.4–15.2)

## 2020-04-05 LAB — PHOSPHORUS: Phosphorus: 3.7 mg/dL (ref 2.5–4.6)

## 2020-04-05 LAB — MAGNESIUM: Magnesium: 2 mg/dL (ref 1.7–2.4)

## 2020-04-05 LAB — TROPONIN I (HIGH SENSITIVITY): Troponin I (High Sensitivity): 43 ng/L — ABNORMAL HIGH (ref <18)

## 2020-04-05 MED ORDER — DEXTROSE IN LACTATED RINGERS 5 % IV SOLN: INTRAVENOUS | Status: DC

## 2020-04-05 MED ORDER — EPINEPHRINE PF 1 MG/ML IJ SOLN
INTRAMUSCULAR | Status: AC
  Filled 2020-04-05: qty 4

## 2020-04-05 MED ORDER — ASPIRIN EC 81 MG PO TBEC
81.0000 mg | DELAYED_RELEASE_TABLET | Freq: Every day | ORAL | Status: DC
  Administered 2020-04-05 – 2020-04-08 (×4): 81 mg via ORAL
  Filled 2020-04-05 (×4): qty 1

## 2020-04-05 MED ORDER — ATORVASTATIN CALCIUM 40 MG PO TABS
40.0000 mg | ORAL_TABLET | Freq: Every day | ORAL | Status: DC
  Administered 2020-04-05 – 2020-04-07 (×3): 40 mg via ORAL
  Filled 2020-04-05 (×3): qty 1

## 2020-04-05 MED ORDER — CHLORHEXIDINE GLUCONATE CLOTH 2 % EX PADS
6.0000 | MEDICATED_PAD | Freq: Every day | CUTANEOUS | Status: DC
  Administered 2020-04-05 – 2020-04-08 (×4): 6 via TOPICAL

## 2020-04-05 MED ORDER — OFLOXACIN 0.3 % OP SOLN
1.0000 [drp] | Freq: Four times a day (QID) | OPHTHALMIC | Status: DC
  Administered 2020-04-05 – 2020-04-08 (×12): 1 [drp] via OPHTHALMIC
  Filled 2020-04-05: qty 5

## 2020-04-05 MED ORDER — HEPARIN SODIUM (PORCINE) 5000 UNIT/ML IJ SOLN
5000.0000 [IU] | Freq: Three times a day (TID) | INTRAMUSCULAR | Status: DC
  Administered 2020-04-05: 5000 [IU] via SUBCUTANEOUS
  Filled 2020-04-05: qty 1

## 2020-04-05 MED ORDER — CARVEDILOL 3.125 MG PO TABS
6.2500 mg | ORAL_TABLET | Freq: Two times a day (BID) | ORAL | Status: DC
  Administered 2020-04-05 – 2020-04-08 (×6): 6.25 mg via ORAL
  Filled 2020-04-05 (×6): qty 2

## 2020-04-05 MED ORDER — LACTATED RINGERS IV BOLUS
1000.0000 mL | INTRAVENOUS | Status: AC
  Administered 2020-04-05: 1000 mL via INTRAVENOUS

## 2020-04-05 MED ORDER — INSULIN ASPART 100 UNIT/ML ~~LOC~~ SOLN
0.0000 [IU] | Freq: Three times a day (TID) | SUBCUTANEOUS | Status: DC
  Administered 2020-04-06: 3 [IU] via SUBCUTANEOUS
  Administered 2020-04-06: 7 [IU] via SUBCUTANEOUS

## 2020-04-05 MED ORDER — INSULIN REGULAR(HUMAN) IN NACL 100-0.9 UT/100ML-% IV SOLN
INTRAVENOUS | Status: DC
  Administered 2020-04-05: 11 [IU]/h via INTRAVENOUS
  Filled 2020-04-05: qty 100

## 2020-04-05 MED ORDER — DEXTROSE-NACL 5-0.9 % IV SOLN: INTRAVENOUS | Status: DC

## 2020-04-05 MED ORDER — INSULIN GLARGINE 100 UNIT/ML ~~LOC~~ SOLN
20.0000 [IU] | Freq: Every day | SUBCUTANEOUS | Status: DC
  Administered 2020-04-05 – 2020-04-06 (×2): 20 [IU] via SUBCUTANEOUS
  Filled 2020-04-05 (×3): qty 0.2

## 2020-04-05 MED ORDER — ATORVASTATIN CALCIUM 40 MG PO TABS: 80.0000 mg | ORAL_TABLET | Freq: Every day | ORAL | Status: DC

## 2020-04-05 MED ORDER — NOREPINEPHRINE 4 MG/250ML-% IV SOLN
INTRAVENOUS | Status: AC
  Administered 2020-04-05: 5 ug/kg/min
  Filled 2020-04-05: qty 250

## 2020-04-05 MED ORDER — FUROSEMIDE 40 MG PO TABS: 40.0000 mg | ORAL_TABLET | Freq: Every day | ORAL | Status: DC

## 2020-04-05 MED ORDER — INSULIN ASPART 100 UNIT/ML ~~LOC~~ SOLN
0.0000 [IU] | SUBCUTANEOUS | Status: DC
  Administered 2020-04-05 (×2): 2 [IU] via SUBCUTANEOUS
  Administered 2020-04-05: 3 [IU] via SUBCUTANEOUS

## 2020-04-05 MED ORDER — LACTATED RINGERS IV SOLN: INTRAVENOUS | Status: DC

## 2020-04-05 MED ORDER — SODIUM CHLORIDE 0.9 % IV BOLUS
1000.0000 mL | Freq: Once | INTRAVENOUS | Status: AC
  Administered 2020-04-05: 1000 mL via INTRAVENOUS

## 2020-04-05 MED ORDER — HYDRALAZINE HCL 25 MG PO TABS: 50.0000 mg | ORAL_TABLET | Freq: Three times a day (TID) | ORAL | Status: DC | PRN

## 2020-04-05 MED ORDER — POTASSIUM CHLORIDE 10 MEQ/100ML IV SOLN
10.0000 meq | INTRAVENOUS | Status: AC
  Administered 2020-04-05 (×2): 10 meq via INTRAVENOUS
  Filled 2020-04-05 (×2): qty 100

## 2020-04-05 MED ORDER — SODIUM CHLORIDE 0.9 % IV BOLUS
500.0000 mL | Freq: Once | INTRAVENOUS | Status: AC
  Administered 2020-04-05: 500 mL via INTRAVENOUS

## 2020-04-05 MED ORDER — INSULIN ASPART 100 UNIT/ML ~~LOC~~ SOLN
0.0000 [IU] | Freq: Every day | SUBCUTANEOUS | Status: DC
  Administered 2020-04-07: 3 [IU] via SUBCUTANEOUS

## 2020-04-05 MED ORDER — KETOROLAC TROMETHAMINE 0.5 % OP SOLN
1.0000 [drp] | Freq: Four times a day (QID) | OPHTHALMIC | Status: DC
  Administered 2020-04-05 – 2020-04-08 (×12): 1 [drp] via OPHTHALMIC
  Filled 2020-04-05: qty 3

## 2020-04-05 MED ORDER — SODIUM CHLORIDE 0.9 % IV SOLN: INTRAVENOUS | Status: DC

## 2020-04-05 MED ORDER — EPINEPHRINE HCL 5 MG/250ML IV SOLN IN NS
0.5000 ug/min | INTRAVENOUS | Status: DC
  Administered 2020-04-05: 1 ug/min via INTRAVENOUS
  Filled 2020-04-05: qty 250

## 2020-04-05 MED ORDER — DEXTROSE 50 % IV SOLN
0.0000 mL | INTRAVENOUS | Status: DC | PRN
  Administered 2020-04-05: 50 mL via INTRAVENOUS
  Filled 2020-04-05: qty 50

## 2020-04-05 NOTE — Progress Notes (Addendum)
CRITICAL VALUE ALERT  Critical Value:  Serum Osmolality 347  Date & Time Notied:  04/05/20 @ 1020.  Provider Notified: Loleta Books, MD.  Orders Received/Actions taken: Normal Saline Bolus.

## 2020-04-05 NOTE — Code Documentation (Addendum)
Pt presented to unit at approximately 0148. Patient was alert and oriented X4 and had no complaints of pain just tingling in lower extremities. At approximately 0203 patient had a recorded low BP of 67/44 and stated that she felt that she need to vomit. Patient became obtunded and slumped over and eyes rolled back in head. Patient read [facility] on monitor and became bradycardic. Pt had very thready pulse and eventually lost pulse. Assisting RN immediately started chest compressions for approximately 3-4 min. Called code and grabbed crash cart. Patient then awoke and was oriented and pressure read within normal limits. No code drugs were given at this time.

## 2020-04-05 NOTE — TOC Initial Note (Signed)
Transition of Care [facility]) - Initial/Assessment Note   Patient Details  Name: Chelsea Meza MRN: [id number] Date of Birth: 23-Aug-1935  Transition of Care [facility]) CM/SW Contact:    Sherie Don, LCSW Phone Number: 04/05/2020, 1:14 PM  Clinical Narrative: Patient is an 84 year old female who was admitted for hyperosmolar hyperglycemic state. Patient has a history of DKA, acute kidney injury superimposed on CKD, hypertension, lactic acidosis, hyponatremia, and HLD. CSW spoke with patient to complete assessment. Per patient, she lives alone. Her current DME includes a cane and shower chair. Patient is not currently active with [facility] services at this time. Patient reported her daughter transports her to appointments and she is able to afford her medications each month. Patient is mostly independent with ADLs. CSW discussed SNF vs. HH with patient and patient stated she would prefer [facility] services. TOC to follow.  Expected Discharge Plan: [place] Barriers to Discharge: Continued Medical Work up  Patient Goals and CMS Choice Patient states their goals for this hospitalization and ongoing recovery are:: Return home CMS Medicare.gov Compare Post Acute Care list provided to:: Patient Choice offered to / list presented to : Patient  Expected Discharge Plan and Services Expected Discharge Plan: [place] In-house Referral: Clinical Social Work Discharge Planning Services: NA Post Acute Care Choice: Home Health Living arrangements for the past 2 months: Single Family Home             DME Arranged: N/A DME Agency: NA  Prior Living Arrangements/Services Living arrangements for the past 2 months: Single Family Home Lives with:: Self Patient language and need for interpreter reviewed:: Yes Do you feel safe going back to the place where you live?: Yes      Need for Family Participation in Patient Care: No (Comment) Care giver support system in place?: Yes  (comment) Current home services: DME (Cane, shower chair) Criminal Activity/Legal Involvement Pertinent to Current Situation/Hospitalization: No - Comment as needed  Permission Sought/Granted Permission sought to share information with : Other (comment) Permission granted to share info w AGENCY: [place] agencies  Emotional Assessment Appearance:: Appears stated age Attitude/Demeanor/Rapport: Engaged Affect (typically observed): Accepting Orientation: : Oriented to Self, Oriented to Place, Oriented to  Time, Oriented to Situation Alcohol / Substance Use: Not Applicable Psych Involvement: No (comment)  Admission diagnosis:  Primary hypertension [I10] AKI (acute kidney injury) ([place]) [N17.9] Hyperglycemia without ketosis [R73.9] Hyperosmolar hyperglycemic state (HHS) ([place]) [E11.00, E11.65] Cardiac arrest [facility]) [I46.9] Patient Active Problem List   Diagnosis Date Noted  . Hyperosmolar hyperglycemic state (HHS) ([place]) 04/05/2020  . Hypertensive urgency 04/05/2020  . Cardiac arrest ([place]) 04/05/2020  . Acute CHF (congestive heart failure) ([place]) 10/18/2018  . Hyperosmolar non-ketotic state in patient with type 2 diabetes mellitus ([place]) 10/18/2018  . TIA (transient ischemic attack) 11/05/2017  . Hypokalemia 11/05/2017  . Hyponatremia 03/20/2015  . AKI (acute kidney injury) ([place]) 03/20/2015  . CKD (chronic kidney disease) stage 3, GFR 30-59 ml/min ([name]) 03/20/2015  . Hyperglycemia 03/20/2015  . HLD (hyperlipidemia) 03/20/2015  . Diabetes mellitus due to underlying condition with diabetic nephropathy ([place]) 03/20/2015  . Diabetic hyperosmolar non-ketotic state ([place]) 07/14/2012  . Lactic acidosis 07/14/2012  . DKA, type 2 ([place]) 07/13/2012  . Acute kidney injury superimposed on CKD ([place]) 07/13/2012  . PSVT (paroxysmal supraventricular tachycardia) ([place]) 07/13/2012  . Weakness of both legs 07/13/2012  . Paresthesia of left leg 07/13/2012  . Cough 07/13/2012  . Essential hypertension, benign  07/13/2012  PCP:  Iona Beard, MD Pharmacy:   [facility] [address] [place] [address] Phone: [phone] Fax: [phone]  Readmission Risk Interventions No flowsheet data found.

## 2020-04-05 NOTE — Code Documentation (Addendum)
Around 0245 patient became hypotensive and upon entrance into room patient was obtunded again and could not keep eyes open or hold attention. Patient became increasingly hypotensive- BP 58/31(31) with HR of 63. Patient did not lose a pulse so did not begin compressions. Pushed 1 mg atropine and 1 mg epinephrine. Patient immediately regained a stable HR and became more alert. Patient regained normal BP at 0306 but steadily dropped pressures over the next few minutes. Pt became hypoxic with Spo2 in 40's and started on NRB 15L  Patient received 500 NS bolus and started Norepinephrine and Epinephrine gtt. Pt has remained on gtt's throughout the night. Discontinued insulin gtt and started D5 0.9 NS for rapidly decreasing BG. Patient is currently on 15 L NRB. Will continue to monitor status.

## 2020-04-05 NOTE — Progress Notes (Signed)
Pt cbg 49. Has not eaten her supper. Food warmed and given to the patient. Pt is eating at this time. cbg recheck pending.

## 2020-04-05 NOTE — Progress Notes (Signed)
[facility] PROGRESS NOTE    [name]  VXB:[id number] DOB: 1936-04-24 DOA: 04/04/2020 PCP: Iona Beard, MD      Brief Narrative:  Chelsea Meza is a 84 y.o. F with CKD IV baseline Cr 1.9, DM, TIA, and HTN who presented with hyperglycemic and hypertensive crisis as well as renal failure.  Patient had had merely vague constitutional complains.  Evidently ran out of her medications several weeks ago and had not had them filled yet.  Since Thanksgiving, her BP and glucoses were up, her legs were swollen and had started to weep, she was peeing often, and finally just felt sick and malaise and so came to the ER.  In the ER, glucose >800, Cr >5, and BP was 229/107.  She was started on antihypertensives, insulin drip and fluids.  After administration of several BP meds at once, the patient lost a pulse and became unresponsive.  CODE BLUE was called x2 (first time she was given few minutes of CPR and patient awoke spontaneously, second time atropine and epinephrine were given and she improved).  Admitted to ICU on epinephrine drip, insulin drip and IV fluids.           Assessment & Plan:  Diabetes with hyperglycemia Diabetic crisis with hyperglycemia without ketosis Uncontrolled diabetes A1c pending.  Patient admitted with glucoses >800, suffered cardiac arrest, PEA arrest in the ER due to     Cardiac arrest -COnsult Cardiology -Obtain echo  Acute renal failure on CKD IV AKI on CKD in setting of hyperglycemia, dehydration, Lasix and spironolactone use. -Hold diuretics -Continue IV fluids -Trend Cr -Obtain UA -Obtain renal US  Hypertension Hypertensive crisis Patient appars to be quite sensitive to her home medicines, and likely even resumption of home meds resulted in PEA arrest today. -Resume carvedilol cautiously -Hold hydralazine except for severe range pressures -Hold verapamil due to bradycardia -Hold spironolactone due to renal  failure  Cerebrovascular disease, secondary prevention -Continue aspirin, atorvastatin  Glaucoma -Continue eye drops    Hyponatremia Mild.       Disposition: Status is: INPATIENT  The patient will require care spanning > 2 midnights and should be moved to inpatient because: Persistent severe electrolyte disturbances, IV treatments appropriate due to intensity of illness or inability to take PO and Inpatient level of care appropriate due to severity of illness  Dispo: The patient is from: Home              Anticipated d/c is to: SNF              Anticipated d/c date is: 3 days              Patient currently is not medically stable to d/c.              MDM: The patient is critically ill with multi-organ failure.  Critical care was necessary to treat or prevent imminent or life-threatening deterioration of cardiac failure, renal failure and was exclusive of separately billable procedures and treating other patients. Total critical care time spent by me: 45 minutes Time spent personally by me on obtaining history from patient or surrogate, evaluation of the patient, evaluation of patient's response to treatment, ordering and review of laboratory studies, ordering and review of radiographic studies, ordering and performing treatments and interventions, and re-evaluation of the patient's condition.    DVT prophylaxis: SCDs Start: 04/05/20 0106  Code Status: FULL Family Communication: Called to daughter, no answer    Consultants:  CCM        Subjective: Patient is weak.  No chest pain, fever, cough.  Objective: Vitals:   04/05/20 1200 04/05/20 1300 04/05/20 1400 04/05/20 1621  BP: 123/65 131/61 (!) 153/72   Pulse: (!) 58 65 (!) 57   Resp: 17 19 (!) 22   Temp:    (!) 97.1 F (36.2 C)  TempSrc:    Axillary  SpO2: 100% 96% 100%   Weight:      Height:        Intake/Output Summary (Last 24 hours) at 04/05/2020 1642 Last data filed at 04/05/2020  0804 Gross per 24 hour  Intake 65.25 ml  Output 100 ml  Net -34.75 ml   Filed Weights   04/04/20 2216 04/05/20 0203  Weight: 86 kg 69.4 kg    Examination: General appearance:  adult female, alert and in no acute distress.   HEENT: Anicteric, conjunctiva pink, lids and lashes normal. No nasal deformity, discharge, epistaxis.  Lips moist, edentulous, OP dry, no oral lesions.   Skin: Warm and dry.  no jaundice.  No suspicious rashes or lesions. Cardiac: RRR, nl S1-S2, no murmurs appreciated.  Capillary refill is brisk.  JVP not visible.  No LE edema.  Radial pulses 2+ and symmetric. Respiratory: Normal respiratory rate and rhythm.  CTAB without rales or wheezes. Abdomen: Abdomen soft.  no TTP. No ascites, distension, hepatosplenomegaly.   MSK: No deformities or effusions. Neuro: Awake and alert.  EOMI, moves all extremities. Speech fluent.    Psych: Sensorium intact and responding to questions, attention normal. Affect normal.  Judgment and insight appear nimaired.    Data Reviewed: I have personally reviewed following labs and imaging studies:  CBC: Recent Labs  Lab 04/04/20 2230 04/05/20 0430  WBC 8.3 11.8*  NEUTROABS 5.0  --   HGB 12.8 11.7*  HCT 39.2 36.4  MCV 86.2 86.9  PLT 203 093   Basic Metabolic Panel: Recent Labs  Lab 04/04/20 2230 04/05/20 0011 04/05/20 0430 04/05/20 1029  NA 119* 120* 127* 130*  K 4.7 4.1 3.9 4.8  CL 80* 85* 92* 97*  CO2 24 21* 19* 18*  GLUCOSE 802* 624* 277* 155*  BUN 126* 109* 117* 114*  CREATININE 5.19* 4.83* 4.74* 4.69*  CALCIUM 9.8 9.6 9.2 8.9  MG  --   --  2.0  --   PHOS  --   --  3.7  --    GFR: Estimated Creatinine Clearance: 9.7 mL/min (A) (by C-G formula based on SCr of 4.69 mg/dL (H)). Liver Function Tests: Recent Labs  Lab 04/04/20 2230 04/05/20 0430  AST 23 37  ALT 15 22  ALKPHOS 115 103  BILITOT 0.7 0.8  PROT 8.2* 7.3  ALBUMIN 4.3 3.7   No results for input(s): LIPASE, AMYLASE in the last 168 hours. No  results for input(s): AMMONIA in the last 168 hours. Coagulation Profile: Recent Labs  Lab 04/05/20 0430  INR 1.0   Cardiac Enzymes: No results for input(s): CKTOTAL, CKMB, CKMBINDEX, TROPONINI in the last 168 hours. BNP (last 3 results) No results for input(s): PROBNP in the last 8760 hours. HbA1C: No results for input(s): HGBA1C in the last 72 hours. CBG: Recent Labs  Lab 04/05/20 0341 04/05/20 0506 04/05/20 0755 04/05/20 1116 04/05/20 1623  GLUCAP 254* 232* 174* 154* 64*   Lipid Profile: No results for input(s): CHOL, HDL, LDLCALC, TRIG, CHOLHDL, LDLDIRECT in the last 72 hours. Thyroid Function Tests: No results for input(s): TSH, T4TOTAL, FREET4, T3FREE, THYROIDAB  in the last 72 hours. Anemia Panel: No results for input(s): VITAMINB12, FOLATE, FERRITIN, TIBC, IRON, RETICCTPCT in the last 72 hours. Urine analysis:    Component Value Date/Time   COLORURINE STRAW (A) 10/18/2018 0015   APPEARANCEUR CLEAR 10/18/2018 0015   LABSPEC 1.015 10/18/2018 0015   PHURINE 5.0 10/18/2018 0015   GLUCOSEU >=500 (A) 10/18/2018 0015   HGBUR NEGATIVE 10/18/2018 0015   BILIRUBINUR NEGATIVE 10/18/2018 0015   KETONESUR NEGATIVE 10/18/2018 0015   PROTEINUR NEGATIVE 10/18/2018 0015   UROBILINOGEN 0.2 07/13/2012 1236   NITRITE NEGATIVE 10/18/2018 0015   LEUKOCYTESUR NEGATIVE 10/18/2018 0015   Sepsis Labs: @LABRCNTIP (procalcitonin:4,lacticacidven:4)  ) Recent Results (from the past 240 hour(s))  Resp Panel by RT-PCR (Flu A&B, Covid) Nasopharyngeal Swab     Status: None   Collection Time: 04/04/20 10:30 PM   Specimen: Nasopharyngeal Swab; Nasopharyngeal(NP) swabs in vial transport medium  Result Value Ref Range Status   SARS Coronavirus 2 by RT PCR NEGATIVE NEGATIVE Final    Comment: (NOTE) SARS-CoV-2 target nucleic acids are NOT DETECTED.  The SARS-CoV-2 RNA is generally detectable in upper respiratory specimens during the acute phase of infection. The lowest concentration of  SARS-CoV-2 viral copies this assay can detect is 138 copies/mL. A negative result does not preclude SARS-Cov-2 infection and should not be used as the sole basis for treatment or other patient management decisions. A negative result may occur with  improper specimen collection/handling, submission of specimen other than nasopharyngeal swab, presence of viral mutation(s) within the areas targeted by this assay, and inadequate number of viral copies(<138 copies/mL). A negative result must be combined with clinical observations, patient history, and epidemiological information. The expected result is Negative.  Fact Sheet for Patients:  EntrepreneurPulse.com.au  Fact Sheet for Healthcare Providers:  IncredibleEmployment.be  This test is no t yet approved or cleared by the [place] FDA and  has been authorized for detection and/or diagnosis of SARS-CoV-2 by FDA under an Emergency Use Authorization (EUA). This EUA will remain  in effect (meaning this test can be used) for the duration of the COVID-19 declaration under Section 564(b)(1) of the Act, 21 U.S.C.section 360bbb-3(b)(1), unless the authorization is terminated  or revoked sooner.       Influenza A by PCR NEGATIVE NEGATIVE Final   Influenza B by PCR NEGATIVE NEGATIVE Final    Comment: (NOTE) The Xpert Xpress SARS-CoV-2/FLU/RSV plus assay is intended as an aid in the diagnosis of influenza from Nasopharyngeal swab specimens and should not be used as a sole basis for treatment. Nasal washings and aspirates are unacceptable for Xpert Xpress SARS-CoV-2/FLU/RSV testing.  Fact Sheet for Patients: EntrepreneurPulse.com.au  Fact Sheet for Healthcare Providers: IncredibleEmployment.be  This test is not yet approved or cleared by the [place] FDA and has been authorized for detection and/or diagnosis of SARS-CoV-2 by FDA under an Emergency Use  Authorization (EUA). This EUA will remain in effect (meaning this test can be used) for the duration of the COVID-19 declaration under Section 564(b)(1) of the Act, 21 U.S.C. section 360bbb-3(b)(1), unless the authorization is terminated or revoked.  Performed at [facility], [address] [place], [place] [address]   MRSA PCR Screening     Status: None   Collection Time: 04/05/20  1:55 AM   Specimen: Nasal Mucosa; Nasopharyngeal  Result Value Ref Range Status   MRSA by PCR NEGATIVE NEGATIVE Final    Comment:        The GeneXpert MRSA Assay (FDA approved for NASAL specimens  only), is one component of a comprehensive MRSA colonization surveillance program. It is not intended to diagnose MRSA infection nor to guide or monitor treatment for MRSA infections. Performed at [facility], [address], [place], [place] [address]          Radiology Studies: ECHOCARDIOGRAM COMPLETE  Result Date: 04/05/2020    ECHOCARDIOGRAM REPORT   Patient Name:   JONAE RENSHAW Date of Exam: 04/05/2020 Medical Rec #:  [id number]         Height:       69.0 in Accession #:    [id number]        Weight:       153.0 lb Date of Birth:  26-Apr-1936         BSA:          1.844 m Patient Age:    40 years          BP:           145/79 mmHg Patient Gender: F                 HR:           69 bpm. Exam Location:  [facility] Procedure: 2D Echo, Cardiac Doppler and Color Doppler Indications:    Cardiac arrest I46.9  History:        Patient has prior history of Echocardiogram examinations, most                 recent 10/20/2018. CHF, TIA; Risk Factors:Hypertension, Diabetes                 and Dyslipidemia. CKD (chronic kidney disease) stage 3, GFR                 30-59 ml/min.  Sonographer:    Alvino Chapel RCS Referring Phys: [id number] [place]  1. Left ventricular ejection fraction, by estimation, is 65 to 70%. The left ventricle has normal function. The left ventricle has no regional wall motion  abnormalities. There is severe left ventricular hypertrophy. Left ventricular diastolic parameters  are consistent with Grade I diastolic dysfunction (impaired relaxation). Elevated left atrial pressure.  2. Right ventricular systolic function is normal. The right ventricular size is normal.  3. Left atrial size was moderately dilated.  4. The pericardial effusion is circumferential.  5. The mitral valve is normal in structure. Trivial mitral valve regurgitation. No evidence of mitral stenosis.  6. The aortic valve is tricuspid. Aortic valve regurgitation is not visualized. No aortic stenosis is present.  7. The inferior vena cava is normal in size with greater than 50% respiratory variability, suggesting right atrial pressure of 3 mmHg. FINDINGS  Left Ventricle: Left ventricular ejection fraction, by estimation, is 65 to 70%. The left ventricle has normal function. The left ventricle has no regional wall motion abnormalities. The left ventricular internal cavity size was normal in size. There is  severe left ventricular hypertrophy. Left ventricular diastolic parameters are consistent with Grade I diastolic dysfunction (impaired relaxation). Elevated left atrial pressure. Right Ventricle: The right ventricular size is normal. No increase in right ventricular wall thickness. Right ventricular systolic function is normal. Left Atrium: Left atrial size was moderately dilated. Right Atrium: Right atrial size was not well visualized. Pericardium: Trivial pericardial effusion is present. The pericardial effusion is circumferential. Mitral Valve: The mitral valve is normal in structure. Trivial mitral valve regurgitation. No evidence of mitral valve stenosis. Tricuspid Valve: The tricuspid valve is normal  in structure. Tricuspid valve regurgitation is not demonstrated. No evidence of tricuspid stenosis. Aortic Valve: The aortic valve is tricuspid. Aortic valve regurgitation is not visualized. No aortic stenosis is  present. Aortic valve mean gradient measures 4.1 mmHg. Aortic valve peak gradient measures 7.9 mmHg. Aortic valve area, by VTI measures 2.08 cm. Pulmonic Valve: The pulmonic valve was not well visualized. Pulmonic valve regurgitation is mild. No evidence of pulmonic stenosis. Aorta: The aortic root is normal in size and structure. Pulmonary Artery: Indeterminant PASP, inadequate TR jet. Venous: The inferior vena cava is normal in size with greater than 50% respiratory variability, suggesting right atrial pressure of 3 mmHg. IAS/Shunts: The interatrial septum was not well visualized.  LEFT VENTRICLE PLAX 2D LVIDd:         3.60 cm  Diastology LVIDs:         2.10 cm  LV e' medial:    3.59 cm/s LV PW:         1.70 cm  LV E/e' medial:  17.1 LV IVS:        1.60 cm  LV e' lateral:   5.33 cm/s LVOT diam:     1.90 cm  LV E/e' lateral: 11.5 LV SV:         52 LV SV Index:   28 LVOT Area:     2.84 cm  RIGHT VENTRICLE RV S prime:     13.70 cm/s TAPSE (M-mode): 1.8 cm LEFT ATRIUM             Index       RIGHT ATRIUM           Index LA diam:        3.60 cm 1.95 cm/m  RA Area:     15.50 cm LA Vol (A2C):   55.2 ml 29.94 ml/m RA Volume:   39.00 ml  21.15 ml/m LA Vol (A4C):   66.7 ml 36.18 ml/m LA Biplane Vol: 63.5 ml 34.44 ml/m  AORTIC VALVE AV Area (Vmax):    1.94 cm AV Area (Vmean):   2.01 cm AV Area (VTI):     2.08 cm AV Vmax:           140.62 cm/s AV Vmean:          93.727 cm/s AV VTI:            0.248 m AV Peak Grad:      7.9 mmHg AV Mean Grad:      4.1 mmHg LVOT Vmax:         96.40 cm/s LVOT Vmean:        66.600 cm/s LVOT VTI:          0.182 m LVOT/AV VTI ratio: 0.74  AORTA Ao Root diam: 3.80 cm MITRAL VALVE MV Area (PHT): 1.86 cm    SHUNTS MV Decel Time: 408 msec    Systemic VTI:  0.18 m MV E velocity: 61.40 cm/s  Systemic Diam: 1.90 cm MV A velocity: 85.20 cm/s MV E/A ratio:  0.72 Carlyle Dolly MD Electronically signed by Carlyle Dolly MD Signature Date/Time: 04/05/2020/1:09:17 PM    Final          Scheduled Meds: . aspirin EC  81 mg Oral Daily  . atorvastatin  40 mg Oral QHS  . carvedilol  6.25 mg Oral BID WC  . Chlorhexidine Gluconate Cloth  6 each Topical Daily  . [START ON 04/06/2020] furosemide  40 mg Oral Daily  . insulin aspart  0-5 Units  Subcutaneous QHS  . insulin aspart  0-9 Units Subcutaneous TID WC  . insulin glargine  20 Units Subcutaneous Daily  . ketorolac  1 drop Left Eye QID  . ofloxacin  1 drop Left Eye QID   Continuous Infusions:    LOS: 0 days    Time spent: 45 minutes    Edwin Dada, MD [facility] 04/05/2020, 4:42 PM     Please page though [place] or Epic secure chat:  For [employer], Adult nurse

## 2020-04-05 NOTE — Consult Note (Addendum)
Cardiology Consultation:   Patient ID: Chelsea MUCHA MRN: [id number]; DOB: 22-Oct-1935  Admit date: 04/04/2020 Date of Consult: 04/05/2020  Primary Care Provider: Iona Beard, MD [facility] Cardiologist: Carlyle Dolly, MD  [facility] Electrophysiologist:  None   Called for consult regarding patient being admitted to [facility] post cardiac arrest.  All of the below information was obtained via discussion with primary team and chart review. Cardiology to follow up in the morning with additional recs.   Patient Profile:   73F with DM2, CKD3, HLD, HTN, anemia and prior TIA who presented to the [facility] on 12/05 for HTN and hyperglycemia found to have HHS now course c/b cardiac arrest with ROSC.   History of Present Illness:   Chelsea Meza was brought in by [facility] PM following evaluation at [facility] where she was found to be HTN and hyperglycemic (BG >600 on EMS check).  Chelsea Meza reported that her blood sugars have been elevated since Thanksgiving but she has had a more of sweets including chocolate since Thanksgiving.  She felt like her blood pressure was not significantly different from her baseline although it was 229/107 on presentation.  She had reported mild urinary frequency but not any dry mouth, shortness of breath, nausea or emesis.  Her daughter was with her in the emergency department and said that her blood pressure usually runs 160/90.  Her daughter said that she went out of her medications from 2 weeks ago after a miscommunication between her pharmacy and the physician's office.  She was started back on her verapamil 80 mg, hydral 100 mg, and coreg 12.5 mg.  Her glucose was around 800 on admission without any metabolic acidosis so she was given IV fluids and started on IV insulin.  Lab work was also notable for hyponatremia (Na 119) however corrected Na (130) in the setting of profound hyperglycemia with HHS. She also had a mild lactic acidosis (2.8).  She was  transferred to the ICU at [facility] and was AOx4 with only complaint of lower extremity tingling.  She unfortunately was found to be profoundly hypotensive at 0203 with a blood pressure of 67/44 and reported that she felt like she needed to throw up.  Repeat BP 58/31. Documented that she may have lost pulse transiently and CPR not initiated but atropine 1 mg IV given along with epi 1 mg IV. She then became acutely obtunded and slumped over prompting code with CPR for 3-4 minutes.  Per nursing documentation it is unclear whether she truly lost pulse or it was just thready. The monitor read VT followed by bradycardia.  No cardiac drugs were given during ACLS. Following chest compressions she regained normal blood pressure transiently but then became progressively more hypotensive.  She was given fluid bolus and started on Levophed and epinephrine.  Following this she became hypoxic with sats in the 40s and was placed on nonrebreather at 15 L/percent FiO2.  Insulin drip was discontinued and she was started on D5 normal saline. Post code ECG with concerning ST elevations in anteroseptal leads, has baseline LVH but this was very different from prior.   Past Medical History:  Diagnosis Date  . Anemia   . CKD (chronic kidney disease) stage 3, GFR 30-59 ml/min ([name])   . Diabetes mellitus without complication ([place])   . Hyperlipidemia   . Hypertension   . TIA (transient ischemic attack)     Past Surgical History:  Procedure Laterality Date  . CHOLECYSTECTOMY  Home Medications:  Prior to Admission medications   Medication Sig Start Date End Date Taking? Authorizing Provider  aspirin EC 81 MG tablet Take 81-325 mg by mouth daily.     [provider]  atorvastatin (LIPITOR) 80 MG tablet Take 1 tablet (80 mg total) by mouth daily. 11/07/17   Orson Eva, MD  carvedilol (COREG) 6.25 MG tablet Take 1 tablet (6.25 mg total) by mouth 2 (two) times daily with a meal. 10/22/18   Tat, Shanon Brow, MD   Cyanocobalamin (VITAMIN B-12 CR PO) Take 1 tablet by mouth daily.    [provider]  hydrALAZINE (APRESOLINE) 100 MG tablet Take 1 tablet (100 mg total) by mouth 3 (three) times daily. 10/22/18   Orson Eva, MD  insulin glargine (LANTUS) 100 unit/mL SOPN Inject 0.35 mLs (35 Units total) into the skin daily. 10/22/18   Orson Eva, MD  ketorolac (ACULAR) 0.5 % ophthalmic solution Place 1 drop into the left eye 4 (four) times daily.  09/17/18   [provider]  ofloxacin (OCUFLOX) 0.3 % ophthalmic solution Place 1 drop into the left eye 4 (four) times daily. 09/16/18   [provider]  sitaGLIPtin (JANUVIA) 50 MG tablet Take 1 tablet (50 mg total) by mouth daily. 10/22/18   Orson Eva, MD  verapamil (CALAN-SR) 240 MG CR tablet Take 1 tablet (240 mg total) by mouth daily. 10/23/18   Orson Eva, MD    Inpatient Medications: Scheduled Meds: . heparin  5,000 Units Subcutaneous Q8H  . insulin aspart  0-9 Units Subcutaneous Q4H   Continuous Infusions: . dextrose 5% lactated ringers Stopped (04/05/20 0002)  . dextrose 5 % and 0.9% NaCl 125 mL/hr at 04/05/20 0501  . epinephrine 3 mcg/min (04/05/20 0336)   PRN Meds: dextrose  Allergies:   No Known Allergies  Social History:   Social History   Socioeconomic History  . Marital status: Divorced    Spouse name: Not on file  . Number of children: Not on file  . Years of education: Not on file  . Highest education level: Not on file  Occupational History  . Not on file  Tobacco Use  . Smoking status: Never Smoker  . Smokeless tobacco: Never Used  Substance and Sexual Activity  . Alcohol use: No  . Drug use: No  . Sexual activity: Not on file  Other Topics Concern  . Not on file  Social History Narrative   Patient lives in [place] with her daughter.  1 level home.   Social Determinants of Health   Financial Resource Strain:   . Difficulty of Paying Living Expenses: Not on file  Food Insecurity:   . Worried  About Charity fundraiser in the Last Year: Not on file  . Ran Out of Food in the Last Year: Not on file  Transportation Needs:   . Lack of Transportation (Medical): Not on file  . Lack of Transportation (Non-Medical): Not on file  Physical Activity:   . Days of Exercise per Week: Not on file  . Minutes of Exercise per Session: Not on file  Stress:   . Feeling of Stress : Not on file  Social Connections:   . Frequency of Communication with Friends and Family: Not on file  . Frequency of Social Gatherings with Friends and Family: Not on file  . Attends Religious Services: Not on file  . Active Member of Clubs or Organizations: Not on file  . Attends Archivist Meetings: Not on file  .  Marital Status: Not on file  Intimate Partner Violence:   . Fear of Current or Ex-Partner: Not on file  . Emotionally Abused: Not on file  . Physically Abused: Not on file  . Sexually Abused: Not on file    Family History:    Family History  Problem Relation Age of Onset  . Stroke Mother   . Stroke Maternal Grandmother     ROS:  Review of Systems: [y] = yes, [ ]  = no       General: Weight gain [ ] ; Weight loss [ ] ; Anorexia [ ] ; Fatigue [ ] ; Fever [ ] ; Chills [ ] ; Weakness [ ]     Cardiac: Chest pain/pressure [ ] ; Resting SOB [ ] ; Exertional SOB [ ] ; Orthopnea [ ] ; Pedal Edema [ ] ; Palpitations [ ] ; Syncope [ ] ; Presyncope [ ] ; Paroxysmal nocturnal dyspnea [ ]     Pulmonary: Cough [ ] ; Wheezing [ ] ; Hemoptysis [ ] ; Sputum [ ] ; Snoring [ ]     GI: Vomiting [ ] ; Dysphagia [ ] ; Melena [ ] ; Hematochezia [ ] ; Heartburn [ ] ; Abdominal pain [ ] ; Constipation [ ] ; Diarrhea [ ] ; BRBPR [ ]     GU: Hematuria [ ] ; Dysuria [ ] ; Nocturia [ ]   Vascular: Pain in legs with walking [ ] ; Pain in feet with lying flat [ ] ; Non-healing sores [ ] ; Stroke [ ] ; TIA [ ] ; Slurred speech [ ] ;    Neuro: Headaches [ ] ; Vertigo [ ] ; Seizures [ ] ; Paresthesias [ ] ;Blurred vision [ ] ; Diplopia [ ] ; Vision changes [ ]      Ortho/Skin: Arthritis [ ] ; Joint pain [ ] ; Muscle pain [ ] ; Joint swelling [ ] ; Back Pain [ ] ; Rash [ ]     Psych: Depression [ ] ; Anxiety [ ]     Heme: Bleeding problems [ ] ; Clotting disorders [ ] ; Anemia [ ]     Endocrine: Diabetes [ ] ; Thyroid dysfunction [ ]    Physical Exam/Data:   Vitals:   04/05/20 0400 04/05/20 0402 04/05/20 0403 04/05/20 0415  BP: 126/73 113/73 114/72 107/67  Pulse: 67     Resp: 13 14 13 12   Temp: 98.3 F (36.8 C)     TempSrc: Oral     SpO2: 100%     Weight:      Height:        Intake/Output Summary (Last 24 hours) at 04/05/2020 0506 Last data filed at 04/05/2020 0400 Gross per 24 hour  Intake --  Output 100 ml  Net -100 ml   Last 3 Weights 04/05/2020 04/04/2020 10/22/2018  Weight (lbs) 153 lb 189 lb 9.5 oz 189 lb 9.5 oz  Weight (kg) 69.4 kg 86 kg 86 kg     Body mass index is 22.59 kg/m.   Physical exam not performed, remote review of records  EKG:  The EKG was personally reviewed and demonstrates: transient anteroseptal ST elevations with resolution on subsequent ECGs Telemetry:  Telemetry was personally reviewed and demonstrates: no VT/VF or heart block preceding code  Relevant CV Studies:  TTE  Result date: 10/20/18 1. The left ventricle has normal systolic function with an ejection  fraction of 60-65%. The cavity size was normal. There is moderately  increased left ventricular wall thickness. Left ventricular diastolic  Doppler parameters are consistent with impaired  relaxation. No evidence of left ventricular regional wall motion  abnormalities.  2. The right ventricle has normal systolic function. The cavity was  normal. There is no increase in right ventricular  wall thickness.  3. The pericardial effusion is circumferential.  4. Trivial pericardial effusion is present.  5. The aortic valve is tricuspid. Mild thickening of the aortic valve.  Mild calcification of the aortic valve.   Laboratory Data:  High Sensitivity  Troponin:  No results for input(s): TROPONINIHS in the last 720 hours.   Chemistry Recent Labs  Lab 04/04/20 2230 04/05/20 0011  NA 119* 120*  K 4.7 4.1  CL 80* 85*  CO2 24 21*  GLUCOSE 802* 624*  BUN 126* 109*  CREATININE 5.19* 4.83*  CALCIUM 9.8 9.6  GFRNONAA 8* 8*  ANIONGAP 15 14    Recent Labs  Lab 04/04/20 2230  PROT 8.2*  ALBUMIN 4.3  AST 23  ALT 15  ALKPHOS 115  BILITOT 0.7   Hematology Recent Labs  Lab 04/04/20 2230  WBC 8.3  RBC 4.55  HGB 12.8  HCT 39.2  MCV 86.2  MCHC 32.7  RDW 12.5  PLT 203   BNPNo results for input(s): BNP, PROBNP in the last 168 hours.  DDimer No results for input(s): DDIMER in the last 168 hours.  Radiology/Studies:  No results found. { Assessment and Plan:   55F with DM2, CKD3, HLD, HTN, anemia and prior TIA who presented to the [facility] on 12/05 for HTN and hyperglycemia found to have HHS now course c/b cardiac arrest with ROSC.  It is unclear what the main precipitating factor was for her acute onset abdominal symptoms and hypotension.  Her blood pressure was very elevated on admission and she was restarted on several of her antihypertensives at the same time which she had been off of for 2 weeks.  It may be that she is not taking all medications as prescribed prior to running out of medicine 2 weeks ago.  She did not seen to have acute onset chest pain, chest pressure, or diaphoresis preceding her hypotensive episode.  Reviewed some telemetry strips leading up to her CPR and did not see any VT/VF that would indicate primary ACS as etiology for her episode.  Her ECG post code was concerning for anterior septal ischemia although there are no reciprocal changes and she does have baseline LVH from prior.  Fortunately these changes did significantly resolve following stabilization postcode.  I would not currently treat this as an ACS event.  Per primary team she is alert and oriented and not complaining of any obvious anginal equivalent.  She  is on 2 vasopressors with significant O2 requirement.  Primary team is trying to wean down on vasopressor support and to.  Vital signs otherwise stable. Electrolytes since admission have been WNL other than pseudohypoNa in the setting of hyperglycemia. Mg in process, was hypoMg during prior hospitalization for similar.   Cardiology consult team to provide ongoing recommendations.   For questions or updates, please contact [facility] Please consult [email or web address] for contact info under   Signed, Dion Body, MD  04/05/2020 5:06 AM

## 2020-04-05 NOTE — Progress Notes (Signed)
BP's are stable this morning, when hospitalist rounded, gave the ok to stop both the pressors. Both pressors stopped around 0800. Bp's continuing to remainin stable. Will continue to monitor. Pt is also A&Ox4 this morning, with no complaints of anything that happened overnight.

## 2020-04-05 NOTE — Consult Note (Addendum)
Cardiology Consultation:   Patient ID: Chelsea Meza MRN: [id number]; DOB: Sep 05, 1935  Admit date: 04/04/2020 Date of Consult: 04/05/2020  Primary Care Provider: Iona Beard, MD [facility] Cardiologist: Carlyle Dolly, MD  [facility] Electrophysiologist:  None    Patient Profile:   Chelsea Meza is a 84 y.o. female with a hx of  with a hx of DM, HTN, HLD, CKD IV, TIA, PSVT  who is being seen today for the evaluation of cardia arrest at the request of Dr. Josephine Cables.  History of Present Illness:   Chelsea Meza  with a hx of DM, HTN, HLD, CKD IV, TIA, PSVT. Was seen by [place] in the hospital 09/2018 with SVT in setting of glucose over 800. She had compliance issues with Verapamil. Echo 10/20/19 LVEF 60-65% mod LV wall thickness, impaired relaxation, smallcircumferential pericardial effusion. Holter monitor ordered by PCP 09/2019 but never done.   Patient admitted 04/04/20 with HTN (229/107)and hyperglycemia with BG>600 on EMS 800 on admission treated with IV fluids and IV insulin. Both have been elevated since Thanksgiving. BP usually runs 160/90 per daughter but ran out of meds for 2 weeks b/c of miscommunication between pharmacy and Dr. Gabriel Carina. Admission Na 119 lactic acidosis 2.8.She became nauseated and  had cardiac arrest with ROSC.She became hypotensive with BP 67/44, lost of pulse tansiently treated with atropine 1 mg and epi 1 mg IV then became obtunded and CPR 3-4 min. Monitor showed VT followed by bradycardia with ROSC. She became hypotensive again treated with fluid bolus, levophed and epinephrine. EKG had ant ST elevation that resolved on EKG today.  Patient currently alert and oriented, off O2. She says she lives with her daughter and gets around pretty well. Legs were swollen yesterday and out of meds so BP and glucose high. Denies chest pain, palpitations, dyspnea, dizziness or presyncope.   Past Medical History:  Diagnosis Date  . Anemia   . CKD (chronic kidney disease)  stage 3, GFR 30-59 ml/min ([name])   . Diabetes mellitus without complication ([place])   . Hyperlipidemia   . Hypertension   . TIA (transient ischemic attack)     Past Surgical History:  Procedure Laterality Date  . CHOLECYSTECTOMY       Home Medications:  Prior to Admission medications   Medication Sig Start Date End Date Taking? Authorizing Provider  aspirin EC 81 MG tablet Take 81-325 mg by mouth daily.     [provider]  atorvastatin (LIPITOR) 80 MG tablet Take 1 tablet (80 mg total) by mouth daily. 11/07/17   Orson Eva, MD  carvedilol (COREG) 6.25 MG tablet Take 1 tablet (6.25 mg total) by mouth 2 (two) times daily with a meal. 10/22/18   Tat, Shanon Brow, MD  Cyanocobalamin (VITAMIN B-12 CR PO) Take 1 tablet by mouth daily.    [provider]  hydrALAZINE (APRESOLINE) 100 MG tablet Take 1 tablet (100 mg total) by mouth 3 (three) times daily. 10/22/18   Orson Eva, MD  insulin glargine (LANTUS) 100 unit/mL SOPN Inject 0.35 mLs (35 Units total) into the skin daily. 10/22/18   Orson Eva, MD  ketorolac (ACULAR) 0.5 % ophthalmic solution Place 1 drop into the left eye 4 (four) times daily.  09/17/18   [provider]  ofloxacin (OCUFLOX) 0.3 % ophthalmic solution Place 1 drop into the left eye 4 (four) times daily. 09/16/18   [provider]  sitaGLIPtin (JANUVIA) 50 MG tablet Take 1 tablet (50 mg total) by mouth daily. 10/22/18  Orson Eva, MD  verapamil (CALAN-SR) 240 MG CR tablet Take 1 tablet (240 mg total) by mouth daily. 10/23/18   Orson Eva, MD    Inpatient Medications: Scheduled Meds: . heparin  5,000 Units Subcutaneous Q8H  . insulin aspart  0-9 Units Subcutaneous Q4H   Continuous Infusions: . dextrose 5% lactated ringers Stopped (04/05/20 0002)  . dextrose 5 % and 0.9% NaCl 125 mL/hr at 04/05/20 0501  . epinephrine 3 mcg/min (04/05/20 0336)   PRN Meds: dextrose  Allergies:   No Known Allergies  Social History:   Social History    Socioeconomic History  . Marital status: Divorced    Spouse name: Not on file  . Number of children: Not on file  . Years of education: Not on file  . Highest education level: Not on file  Occupational History  . Not on file  Tobacco Use  . Smoking status: Never Smoker  . Smokeless tobacco: Never Used  Substance and Sexual Activity  . Alcohol use: No  . Drug use: No  . Sexual activity: Not on file  Other Topics Concern  . Not on file  Social History Narrative   Patient lives in [place] with her daughter.  1 level home.   Social Determinants of Health   Financial Resource Strain:   . Difficulty of Paying Living Expenses: Not on file  Food Insecurity:   . Worried About Charity fundraiser in the Last Year: Not on file  . Ran Out of Food in the Last Year: Not on file  Transportation Needs:   . Lack of Transportation (Medical): Not on file  . Lack of Transportation (Non-Medical): Not on file  Physical Activity:   . Days of Exercise per Week: Not on file  . Minutes of Exercise per Session: Not on file  Stress:   . Feeling of Stress : Not on file  Social Connections:   . Frequency of Communication with Friends and Family: Not on file  . Frequency of Social Gatherings with Friends and Family: Not on file  . Attends Religious Services: Not on file  . Active Member of Clubs or Organizations: Not on file  . Attends Archivist Meetings: Not on file  . Marital Status: Not on file  Intimate Partner Violence:   . Fear of Current or Ex-Partner: Not on file  . Emotionally Abused: Not on file  . Physically Abused: Not on file  . Sexually Abused: Not on file    Family History:     Family History  Problem Relation Age of Onset  . Stroke Mother   . Stroke Maternal Grandmother      ROS:  Please see the history of present illness.  Review of Systems  Constitutional: Negative.  HENT: Negative.   Eyes: Negative.   Cardiovascular: Positive for leg swelling.   Respiratory: Negative.   Hematologic/Lymphatic: Negative.   Musculoskeletal: Positive for arthritis. Negative for joint pain.  Gastrointestinal: Negative.   Genitourinary: Negative.   Neurological: Negative.     All other ROS reviewed and negative.     Physical Exam/Data:   Vitals:   04/05/20 0700 04/05/20 0715 04/05/20 0730 04/05/20 0745  BP: (!) 162/82 131/66 124/69 127/67  Pulse: 63 64 65 67  Resp: 11 20 15 18   Temp:      TempSrc:      SpO2: 100% 100% 100% 100%  Weight:      Height:        Intake/Output Summary (Last  24 hours) at 04/05/2020 0756 Last data filed at 04/05/2020 0400 Gross per 24 hour  Intake --  Output 100 ml  Net -100 ml   Last 3 Weights 04/05/2020 04/04/2020 10/22/2018  Weight (lbs) 153 lb 189 lb 9.5 oz 189 lb 9.5 oz  Weight (kg) 69.4 kg 86 kg 86 kg     Body mass index is 22.59 kg/m.  General:  Well nourished, well developed, in no acute distress HEENT: normal Lymph: no adenopathy Neck: no JVD Endocrine:  No thryomegaly Vascular: No carotid bruits; FA pulses 2+ bilaterally without bruits  Cardiac:  normal S1, S2; RRR; 1/6 systolic murmur LSB Lungs:  clear to auscultation bilaterally, no wheezing, rhonchi or rales  Abd: soft, nontender, no hepatomegaly  Ext: no edema Musculoskeletal:  No deformities, BUE and BLE strength normal and equal Skin: warm and dry  Neuro:  CNs 2-12 intact, no focal abnormalities noted Psych:  Normal affect   EKG:  The EKG was personally reviewed and demonstrates: Sinus tachycardia with first degree AV block, LVH, slight ST elevation ant, resolved on f/u EKG Telemetry:  Telemetry was personally reviewed and demonstrates:  NSR-ST, PAC's PVC. Tele readout says VT but looks like artifcat.  Relevant CV Studies: Echo 10/20/19 IMPRESSIONS     1. The left ventricle has normal systolic function with an ejection  fraction of 60-65%. The cavity size was normal. There is moderately  increased left ventricular wall thickness.  Left ventricular diastolic  Doppler parameters are consistent with impaired   relaxation. No evidence of left ventricular regional wall motion  abnormalities.   2. The right ventricle has normal systolic function. The cavity was  normal. There is no increase in right ventricular wall thickness.   3. The pericardial effusion is circumferential.   4. Trivial pericardial effusion is present.   5. The aortic valve is tricuspid. Mild thickening of the aortic valve.  Mild calcification of the aortic valve.    Laboratory Data:  High Sensitivity Troponin:  No results for input(s): TROPONINIHS in the last 720 hours.   Chemistry Recent Labs  Lab 04/04/20 2230 04/05/20 0011 04/05/20 0430  NA 119* 120* 127*  K 4.7 4.1 3.9  CL 80* 85* 92*  CO2 24 21* 19*  GLUCOSE 802* 624* 277*  BUN 126* 109* 117*  CREATININE 5.19* 4.83* 4.74*  CALCIUM 9.8 9.6 9.2  GFRNONAA 8* 8* 9*  ANIONGAP 15 14 16*    Recent Labs  Lab 04/04/20 2230 04/05/20 0430  PROT 8.2* 7.3  ALBUMIN 4.3 3.7  AST 23 37  ALT 15 22  ALKPHOS 115 103  BILITOT 0.7 0.8   Hematology Recent Labs  Lab 04/04/20 2230 04/05/20 0430  WBC 8.3 11.8*  RBC 4.55 4.19  HGB 12.8 11.7*  HCT 39.2 36.4  MCV 86.2 86.9  MCH  --  27.9  MCHC 32.7 32.1  RDW 12.5 11.9  PLT 203 195   BNPNo results for input(s): BNP, PROBNP in the last 168 hours.  DDimer No results for input(s): DDIMER in the last 168 hours.   Radiology/Studies:  No results found.   Assessment and Plan:   In hospital Cardiac arrest with return of ROSC in the setting of hypertensive emergency and hyperglycemia with glucose over 800.  Suddenly became hypotensive. EKG post CPR ST elevation ant leads but EKG today NSR without ST elevation. Check Echo. Currently on  IV fluids, (received hydralazine 100 mg yest, verapamil 80 mg once and coreg 12.5 mg once yest). Off Epi.  Continue to monitor and follow. No angina PTA, only leg swelling. Mg 2.0, K 3.9.  Hypertensive emergency  after running out of meds 2 weeks was on coreg 6.25 mg bid, hydralazine 100 mg tid, verapamil 240 mg daily.  Hyperglycemia with glucose >800, Na 127  History of SVT  DM with hyperosmolar hyperglycemic state.  CKD 3 Crt 4.74  Prior TIA      TIMI Risk Score for Unstable Angina or Non-ST Elevation MI:   The patient's TIMI risk score is  , which indicates a  % risk of all cause mortality, new or recurrent myocardial infarction or need for urgent revascularization in the next 14 days.           For questions or updates, please contact [place] Please consult [email or web address] for contact info under    Signed, Ermalinda Barrios, PA-C  04/05/2020 7:56 AM   Attending note Patient seen and discussed with PA Bonnell Public, I agree with her documentation. 84 yo female history of DM2, HTn, CKD 3, PSVT, TIA admitted with hyperglycemia and HTN after running out of meds 2 weeks ago. Admitted for management of hypertensive urgency and HONK  While in ICU patient with acute drop in bp's to 60s/40s. I reviewed telemetry during this time, sinus rhythm followed by artifarct from chest compressions. There was no VT as menteioned in nursing note. Fro note patient became obtuneded, never lost pulse according to not but very thready. Started chest compressions x 3-4 min. Received IV epi x 1, atropine 1mg  x 1, 3-4 min of CPR. Patient became responsive. Repeat transient episode at 0245 of hypotension, obtunded. From notes did lose pulse but quickly reagained with atropine and epi x 1. Hypoxic after episode, placed on 15 L NRB. Started on pressors.    ER vitals: 211/129 p 77 98% RA COVID neg WBC 8.3 Hgb 12.8 Plt 203 Na 119 Gluc 802 K 4.7 Cr 5.19 Lactric acid 2.8 -->4.8 Initial EKG NSR Post code EKG SR, Jpoint elevation/upsloping ST elevation anterior leads  BPs at 0100 195/94, at 0200 67/44. She received coreg 12.5 at 0029, hydralazine 100mg  oral at 0028, verapamil 80mg  at 0047.    Unclear etiology of her  arrest. At this time no tele evidence of VT or VF, this looks to qualify as a  PEA arrest. Post arrest EKG ST changes not consistent with ACS, trops are pending, echo is pending. Perhaps over rapid correction of her severe hypertension/hypertensive urgency led to transient cerebral and cardiac hypoperfusion as she received coreg, hydral, verpamil all within 1-2 hours of actue drop in bp and her event and has had a very rapid resolution of her hypertension this AM off all pressors. At this time do no see evidence of a primary cardiac cause, follow further workup today  Carlyle Dolly MD

## 2020-04-05 NOTE — Progress Notes (Signed)
After pt ate her meal she was showing a cbg of 65. At this time hypoglycemic protocol followed. D50 IV push given. Pt in no distress at this time.

## 2020-04-05 NOTE — Progress Notes (Signed)
*  PRELIMINARY RESULTS* Echocardiogram 2D Echocardiogram has been performed.  Chelsea Meza 04/05/2020, 12:36 PM

## 2020-04-05 NOTE — ED Notes (Signed)
Lactic acid of 2.8 reported to edp [facility]

## 2020-04-05 NOTE — H&P (Signed)
History and Physical  Chelsea Meza YCX:[id number] DOB: 03/09/1936 DOA: 04/04/2020  Referring physician: Rolland Porter, MD PCP: Iona Beard, MD  Patient coming from: Home   Chief Complaint: Elevated blood sugar level, high blood pressure  HPI: Chelsea Meza is a 84 y.o. female with medical history significant for  hypertension, diabetes mellitus type 2, PSVT, CKD stage III, TIA, hyperlipidemia who presents to the emergency department due to elevated blood glucose level and high blood pressure.  Patient states that blood glucose level has been elevated since around Thanksgiving, she noted weeping in the legs yesterday and she decided to go to the ED for further evaluation and management.  Considering patient's blood pressure, patient states that BP was always elevated above 200, however, patient's daughter states that the BP was usually around 160/90 (per ED physician).  Apparently, patient ran out of her medication for about 2 weeks and due to miscommunication between the doctor's office and pharmacy, medication was not refilled until 2 days prior to presenting to the ED.  Patient has been using [place] samples, unfortunately, on arrival of her insulin it was noted that it was a different brand.  She endorsed increased urination but denies short of breath, nausea, vomiting, dry mouth or increased thirst.  ED Course:  In the emergency department, she was hemodynamically stable except for intermittent tachypnea and elevated BP at 229/107.  Work-up in the ED showed normal CBC, hyponatremia, BUN to creatinine 126/5.19 (this was 1.90 on 10/22/2018), blood glucose level was 802. Patient was treated with Coreg 12.5 mg p.o., hydralazine 100 mg p.o., and verapamil 80 mg x 1, she was started on insulin drip per Endo tool.  Hospitalist was asked to admit patient for further evaluation and management.   Review of Systems: Constitutional: Negative for chills and fever.  HENT: Negative for ear pain and sore  throat.   Eyes: Negative for pain and visual disturbance.  Respiratory: Negative for cough, chest tightness and shortness of breath.   Cardiovascular: Positive for elevated blood pressure.  Negative for chest pain and palpitations.  Gastrointestinal: Negative for abdominal pain and vomiting.  Endocrine: Positive for elevated blood glucose level.  Negative for polyphagia and polyuria.  Genitourinary: Negative for decreased urine volume, dysuria, enuresis Musculoskeletal: Negative for arthralgias and back pain.  Skin: Negative for color change and rash.  Allergic/Immunologic: Negative for immunocompromised state.  Neurological: Negative for tremors, syncope, speech difficulty, weakness, light-headedness and headaches.  Hematological: Does not bruise/bleed easily.  All other systems reviewed and are negative    Past Medical History:  Diagnosis Date  . Anemia   . CKD (chronic kidney disease) stage 3, GFR 30-59 ml/min ([name])   . Diabetes mellitus without complication ([place])   . Hyperlipidemia   . Hypertension   . TIA (transient ischemic attack)    Past Surgical History:  Procedure Laterality Date  . CHOLECYSTECTOMY      Social History:  reports that she has never smoked. She has never used smokeless tobacco. She reports that she does not drink alcohol and does not use drugs.   No Known Allergies  Family History  Problem Relation Age of Onset  . Stroke Mother   . Stroke Maternal Grandmother      Prior to Admission medications   Medication Sig Start Date End Date Taking? Authorizing Provider  aspirin EC 81 MG tablet Take 81-325 mg by mouth daily.     [provider]  atorvastatin (LIPITOR) 80 MG tablet Take 1 tablet (80  mg total) by mouth daily. 11/07/17   Orson Eva, MD  carvedilol (COREG) 6.25 MG tablet Take 1 tablet (6.25 mg total) by mouth 2 (two) times daily with a meal. 10/22/18   Tat, Shanon Brow, MD  Cyanocobalamin (VITAMIN B-12 CR PO) Take 1 tablet by mouth daily.     [provider]  hydrALAZINE (APRESOLINE) 100 MG tablet Take 1 tablet (100 mg total) by mouth 3 (three) times daily. 10/22/18   Orson Eva, MD  insulin glargine (LANTUS) 100 unit/mL SOPN Inject 0.35 mLs (35 Units total) into the skin daily. 10/22/18   Orson Eva, MD  ketorolac (ACULAR) 0.5 % ophthalmic solution Place 1 drop into the left eye 4 (four) times daily.  09/17/18   [provider]  ofloxacin (OCUFLOX) 0.3 % ophthalmic solution Place 1 drop into the left eye 4 (four) times daily. 09/16/18   [provider]  sitaGLIPtin (JANUVIA) 50 MG tablet Take 1 tablet (50 mg total) by mouth daily. 10/22/18   Orson Eva, MD  verapamil (CALAN-SR) 240 MG CR tablet Take 1 tablet (240 mg total) by mouth daily. 10/23/18   Orson Eva, MD    Physical Exam: BP (!) 162/82   Pulse 63   Temp 98.3 F (36.8 C) (Oral)   Resp 11   Ht 5\' 9"  (1.753 m)   Wt 69.4 kg   SpO2 100%   BMI 22.59 kg/m   . General: 84 y.o. year-old female well developed well nourished in no acute distress.  Alert and oriented x3. Marland Kitchen HEENT: Dry mucous membrane.  NCAT, EOMI . Neck: Supple, trachea medial . Cardiovascular: Regular rate and rhythm with no rubs or gallops.  No thyromegaly or JVD noted.  No lower extremity edema. 2/4 pulses in all 4 extremities. Marland Kitchen Respiratory: Clear to auscultation with no wheezes or rales. Good inspiratory effort. . Abdomen: Soft, nontender nondistended with normal bowel sounds x4 quadrants. . Muskuloskeletal: No cyanosis, clubbing or edema noted bilaterally . Neuro: CN II-XII intact, strength, sensation, reflexes . Skin: No ulcerative lesions noted or rashes . Psychiatry: Judgement and insight appear normal. Mood is appropriate for condition and setting          Labs on Admission:  Basic Metabolic Panel: Recent Labs  Lab 04/04/20 2230 04/05/20 0011 04/05/20 0430  NA 119* 120* 127*  K 4.7 4.1 3.9  CL 80* 85* 92*  CO2 24 21* 19*  GLUCOSE 802* 624* 277*  BUN 126* 109* 117*   CREATININE 5.19* 4.83* 4.74*  CALCIUM 9.8 9.6 9.2  MG  --   --  2.0  PHOS  --   --  3.7   Liver Function Tests: Recent Labs  Lab 04/04/20 2230 04/05/20 0430  AST 23 37  ALT 15 22  ALKPHOS 115 103  BILITOT 0.7 0.8  PROT 8.2* 7.3  ALBUMIN 4.3 3.7   No results for input(s): LIPASE, AMYLASE in the last 168 hours. No results for input(s): AMMONIA in the last 168 hours. CBC: Recent Labs  Lab 04/04/20 2230 04/05/20 0430  WBC 8.3 11.8*  NEUTROABS 5.0  --   HGB 12.8 11.7*  HCT 39.2 36.4  MCV 86.2 86.9  PLT 203 195   Cardiac Enzymes: No results for input(s): CKTOTAL, CKMB, CKMBINDEX, TROPONINI in the last 168 hours.  BNP (last 3 results) No results for input(s): BNP in the last 8760 hours.  ProBNP (last 3 results) No results for input(s): PROBNP in the last 8760 hours.  CBG: Recent Labs  Lab 04/05/20  4008 04/05/20 0134 04/05/20 0216 04/05/20 0341 04/05/20 0506  GLUCAP 534* 426* 338* 254* 232*    Radiological Exams on Admission: No results found.  EKG: I independently viewed the EKG done and my findings are as followed: Sinus rhythm at rate of 77 bpm  Assessment/Plan Present on Admission: . Hyperosmolar hyperglycemic state (HHS) ([place]) . Hyponatremia . DKA, type 2 ([place]) . Essential hypertension, benign . HLD (hyperlipidemia) . Lactic acidosis  Principal Problem:   Hyperosmolar hyperglycemic state (HHS) ([place]) Active Problems:   DKA, type 2 ([employer])   Acute kidney injury superimposed on CKD ([place])   Essential hypertension, benign   Lactic acidosis   Hyponatremia   HLD (hyperlipidemia)   Hypertensive urgency   Cardiac arrest ([place])  Hyperosmolar hyperglycemic state (HHS) Hyperglycemia secondary to poorly controlled type 2 diabetes mellitus Continue insulin drip, IV NS with IV potassium per DKA protocol Transition IV NS to D5 1/2NS when serum glucose reaches 250mg /dL Continue serial BMP and VBG Continue NPO  Hypertensive urgency  Essential  hypertension BP on arrival to the ED was 229/107  She was treated with Coreg 12.5 mg, hydralazine 100 mg and verapamil 80 mg orally Hold BP meds at this time due to hypotension and cardiac arrest event as described below Continue to monitor BP  Reported Cardiac arrest with ROSC Patient was alert and oriented x4 after arrival at the ED and 1:48 AM, BP was noted to drop to 67/44 at 2:03 AM, this was repeated and it was 58/3, she felt like vomiting, pulse was decreased and thready.  Cardiac monitor right V. tach which was followed by bradycardia, IV epinephrine 1 mg IV x1 and IV atropine 1 mg x 1 was given.  CODE BLUE was called due to patient being obtunded and CPR was done for 3-4 minutes with spontaneous ROSC.  Patient momentarily after base became hypotensive again, and IV fluid bolus along with Levophed was started, patient was not responsive to Levophed and epinephrine was started with gradual withdrawal of Levophed.  Patient also became hypoxic with O2 sat dropping to 40s and she was started on supplemental oxygen via NRB at 15 L. EKG done status post CPR was read as ST elevation in anterior leads and first-degree AV block was also noted.  Repeated EKG personally reviewed showed sinus rhythm at rate of 66 bpm with first-degree AV block Cardiology (Dr. Renella Cunas) was consulted.  Consult was highly appreciated  Lactic acidosis Lactic acid was 2.8>4.8 Continue IV hydration and continue to trend lactic acid  Acute kidney injury superimposed on CKD stage V BUN to creatinine 126/5.19 (this was 1.90 on 10/22/2018) Renally adjust medications, avoid nephrotoxic agents/dehydration/hypotension   Pseudohyponatremia Na 119; corrected sodium level based on hyperglycemia was 130  Hyperlipidemia Continue Home statin  DVT prophylaxis: Heparin subcu  Code Status: Full code  Family Communication: None at bedside  Disposition Plan:  Patient is from:                        home Anticipated DC to:                    SNF or family members home Anticipated DC date:               2-3 days Anticipated DC barriers:         Patient is unstable to be discharged at this time due to HHS, lactic acidosis, cardiac arrest status post ROSC requiring inpatient management  Consults called: Cardiology  Admission status: Observation    Bernadette Hoit MD [facility]  04/05/2020, 7:36 AM

## 2020-04-05 NOTE — Consult Note (Signed)
NAME:  Chelsea Meza, MRN:  [id number], DOB:  01-05-1936, LOS: 0 ADMISSION DATE:  04/04/2020, CONSULTATION DATE:  04/05/2020 REFERRING MD:  Dr. Loleta Books, [facility], CHIEF COMPLAINT:  Low blood pressure  History   84 yo female from [facility] with hyperglycemia since Thanksgiving.  She ran out of her medications 2 weeks prior to admission and restarted hydralazine, verapamil, and coreg one day prior to admission.  She had blood pressure drop in ER (BP 67/44) with vomiting episode.  She lost consciousness and had CPR for about 3 to 4 minutes.  Reported to have episode of bradycardia, but never lost pulsed.  She regained consciousness, but then had recurrent episode required atropine and epinephrine.  She again regained consciousness.  She had SpO2 in 40's and started on supplemental oxygen.  She was also started on pressors.    Past Medical History  TIA, HTN, HLD, DM type 2, CKD 4, Anemia  Significant Hospital Events   12/05 Admit 12/06 off pressors  Consults:  Cardiology  Procedures:    Significant Diagnostic Tests:  Echo 12/06 >> EF 65 to 70%, grade 1 DD, severe LVH, mod LA dilation  Micro Data:  COVID/Flu 12/05 >> negative MRSA PCR 12/06 >> negative  Antimicrobials:     Interim history/subjective:  She feels better.  Off pressors.  Transitioned to room air.  Denies chest pain, nausea, dyspnea, abdominal pain.  Appetite is good - eating a sandwich.  Objective   Blood pressure 131/61, pulse 65, temperature (!) 97.4 F (36.3 C), temperature source Oral, resp. rate 19, height 5\' 9"  (1.753 m), weight 69.4 kg, SpO2 96 %.        Intake/Output Summary (Last 24 hours) at 04/05/2020 1339 Last data filed at 04/05/2020 0177 Gross per 24 hour  Intake 65.25 ml  Output 100 ml  Net -34.75 ml   Filed Weights   04/04/20 2216 04/05/20 0203  Weight: 86 kg 69.4 kg    Examination:  General - alert Eyes - pupils reactive ENT - no sinus tenderness, no stridor Cardiac -  regular rate/rhythm, no murmur Chest - equal breath sounds b/l, no wheezing or rales Abdomen - soft, non tender, + bowel sounds Extremities - no cyanosis, clubbing, or edema Skin - no rashes Neuro - normal strength, moves extremities, follows commands Psych - normal mood and behavior   Assessment & Plan:   Hypotension. - likely from hypovolemia in setting of hyperglycemia and resumption of outpt antihypertensive medications  - off pressors in PM 12/06  DM type 2 poorly controlled with non-ketotic hyperosmolar state. - continue SSI with lantus  Pseudohyponatremia. - secondary to hyperglycemia - f/u BMET  Bradycardic arrest. - likely from resumption of outpt medications and hypovolemia - monitor on telemetry  Hx of HTN, HLD. - per primary team and cardiology  AKI from hypovolemia. CKD 4. - baseline creatinine 2.48  from 12/12/19 - followed by Dr. Ulice Bold with nephrology as outpt - f/u BMET, monitor urine outpt  PCCM will sign off.  Please call if additional help needed while she is in hospital.   Labs   CBC: Recent Labs  Lab 04/04/20 2230 04/05/20 0430  WBC 8.3 11.8*  NEUTROABS 5.0  --   HGB 12.8 11.7*  HCT 39.2 36.4  MCV 86.2 86.9  PLT 203 939    Basic Metabolic Panel: Recent Labs  Lab 04/04/20 2230 04/05/20 0011 04/05/20 0430 04/05/20 1029  NA 119* 120* 127* 130*  K 4.7 4.1 3.9 4.8  CL 80* 85* 92* 97*  CO2 24 21* 19* 18*  GLUCOSE 802* 624* 277* 155*  BUN 126* 109* 117* 114*  CREATININE 5.19* 4.83* 4.74* 4.69*  CALCIUM 9.8 9.6 9.2 8.9  MG  --   --  2.0  --   PHOS  --   --  3.7  --    GFR: Estimated Creatinine Clearance: 9.7 mL/min (A) (by C-G formula based on SCr of 4.69 mg/dL (H)). Recent Labs  Lab 04/04/20 2230 04/05/20 0011 04/05/20 0430 04/05/20 1029  WBC 8.3  --  11.8*  --   LATICACIDVEN  --  2.8* 4.8* 3.5*    Liver Function Tests: Recent Labs  Lab 04/04/20 2230 04/05/20 0430  AST 23 37  ALT 15 22  ALKPHOS 115 103   BILITOT 0.7 0.8  PROT 8.2* 7.3  ALBUMIN 4.3 3.7   No results for input(s): LIPASE, AMYLASE in the last 168 hours. No results for input(s): AMMONIA in the last 168 hours.  ABG    Component Value Date/Time   HCO3 23.2 10/17/2018 2325   ACIDBASEDEF 1.3 10/17/2018 2325   O2SAT 90.6 10/17/2018 2325     Coagulation Profile: Recent Labs  Lab 04/05/20 0430  INR 1.0    Cardiac Enzymes: No results for input(s): CKTOTAL, CKMB, CKMBINDEX, TROPONINI in the last 168 hours.  HbA1C: Hgb A1c MFr Bld  Date/Time Value Ref Range Status  10/17/2018 11:25 PM 14.1 (H) 4.8 - 5.6 % Final    Comment:    (NOTE)         Prediabetes: 5.7 - 6.4         Diabetes: >6.4         Glycemic control for adults with diabetes: <7.0   11/06/2017 04:33 AM 6.8 (H) 4.8 - 5.6 % Final    Comment:    (NOTE)         Prediabetes: 5.7 - 6.4         Diabetes: >6.4         Glycemic control for adults with diabetes: <7.0     CBG: Recent Labs  Lab 04/05/20 0216 04/05/20 0341 04/05/20 0506 04/05/20 0755 04/05/20 1116  GLUCAP 338* 254* 232* 174* 154*    Review of Systems:   Reviewed and negative.  Past Medical History  She,  has a past medical history of Anemia, CKD (chronic kidney disease) stage 3, GFR 30-59 ml/min ([name]), Diabetes mellitus without complication ([facility]), Hyperlipidemia, Hypertension, and TIA (transient ischemic attack).   Surgical History    Past Surgical History:  Procedure Laterality Date  . CHOLECYSTECTOMY       Social History   reports that she has never smoked. She has never used smokeless tobacco. She reports that she does not drink alcohol and does not use drugs.   Family History   Her family history includes Stroke in her maternal grandmother and mother.   Allergies No Known Allergies   Home Medications  Prior to Admission medications   Medication Sig Start Date End Date Taking? Authorizing Provider  aspirin EC 81 MG tablet Take 81-325 mg by mouth daily.    Yes  [provider]  atorvastatin (LIPITOR) 40 MG tablet Take 40 mg by mouth at bedtime. 04/02/20  Yes [provider]  carvedilol (COREG) 6.25 MG tablet Take 1 tablet (6.25 mg total) by mouth 2 (two) times daily with a meal. Patient taking differently: Take 12.5 mg by mouth daily.  10/22/18  Yes TatShanon Brow, MD  Cyanocobalamin (  VITAMIN B-12 CR PO) Take 1 tablet by mouth daily.   Yes [provider]  furosemide (LASIX) 40 MG tablet Take 40 mg by mouth daily.  03/10/20  Yes [provider]  hydrALAZINE (APRESOLINE) 100 MG tablet Take 1 tablet (100 mg total) by mouth 3 (three) times daily. 10/22/18  Yes Tat, Shanon Brow, MD  insulin glargine (LANTUS) 100 unit/mL SOPN Inject 0.35 mLs (35 Units total) into the skin daily. 10/22/18  Yes Tat, Shanon Brow, MD  ketorolac (ACULAR) 0.5 % ophthalmic solution Place 1 drop into the left eye 4 (four) times daily.  09/17/18  Yes [provider]  ofloxacin (OCUFLOX) 0.3 % ophthalmic solution Place 1 drop into the left eye 4 (four) times daily. 09/16/18  Yes [provider]  spironolactone (ALDACTONE) 25 MG tablet Take 25 mg by mouth daily. 03/09/20  Yes [provider]  verapamil (CALAN-SR) 240 MG CR tablet Take 1 tablet (240 mg total) by mouth daily. Patient taking differently: Take 80 mg by mouth in the morning, at noon, and at bedtime.  10/23/18  Yes Tat, Shanon Brow, MD  Vitamin D, Ergocalciferol, (DRISDOL) 1.25 MG (50000 UNIT) CAPS capsule Take 50,000 Units by mouth once a week. 01/08/20  Yes [provider]  sitaGLIPtin (JANUVIA) 50 MG tablet Take 1 tablet (50 mg total) by mouth daily. Patient not taking: Reported on 04/05/2020 10/22/18   Orson Eva, MD     Signature:  Chesley Mires, MD [place] Pager - [phone] 04/05/2020, 2:04 PM

## 2020-04-06 LAB — URINALYSIS, ROUTINE W REFLEX MICROSCOPIC
Bilirubin Urine: NEGATIVE
Glucose, UA: 50 mg/dL — AB
Hgb urine dipstick: NEGATIVE
Ketones, ur: NEGATIVE mg/dL
Nitrite: NEGATIVE
Protein, ur: 30 mg/dL — AB
Specific Gravity, Urine: 1.012 (ref 1.005–1.030)
pH: 5 (ref 5.0–8.0)

## 2020-04-06 LAB — BASIC METABOLIC PANEL
Anion gap: 10 (ref 5–15)
BUN: 96 mg/dL — ABNORMAL HIGH (ref 8–23)
CO2: 21 mmol/L — ABNORMAL LOW (ref 22–32)
Calcium: 8.9 mg/dL (ref 8.9–10.3)
Chloride: 99 mmol/L (ref 98–111)
Creatinine, Ser: 3.53 mg/dL — ABNORMAL HIGH (ref 0.44–1.00)
GFR, Estimated: 12 mL/min — ABNORMAL LOW (ref >60)
Glucose, Bld: 170 mg/dL — ABNORMAL HIGH (ref 70–99)
Potassium: 3.7 mmol/L (ref 3.5–5.1)
Sodium: 130 mmol/L — ABNORMAL LOW (ref 135–145)

## 2020-04-06 LAB — CBC
HCT: 35.7 % — ABNORMAL LOW (ref 36.0–46.0)
Hemoglobin: 11.5 g/dL — ABNORMAL LOW (ref 12.0–15.0)
MCH: 28 pg (ref 26.0–34.0)
MCHC: 32.2 g/dL (ref 30.0–36.0)
MCV: 86.9 fL (ref 80.0–100.0)
Platelets: 183 10*3/uL (ref 150–400)
RBC: 4.11 MIL/uL (ref 3.87–5.11)
RDW: 12.4 % (ref 11.5–15.5)
WBC: 8.9 10*3/uL (ref 4.0–10.5)
nRBC: 0 % (ref 0.0–0.2)

## 2020-04-06 LAB — HEMOGLOBIN A1C
Hgb A1c MFr Bld: 13.6 % — ABNORMAL HIGH (ref 4.8–5.6)
Mean Plasma Glucose: 344 mg/dL

## 2020-04-06 LAB — GLUCOSE, CAPILLARY
Glucose-Capillary: 104 mg/dL — ABNORMAL HIGH (ref 70–99)
Glucose-Capillary: 176 mg/dL — ABNORMAL HIGH (ref 70–99)
Glucose-Capillary: 193 mg/dL — ABNORMAL HIGH (ref 70–99)
Glucose-Capillary: 200 mg/dL — ABNORMAL HIGH (ref 70–99)
Glucose-Capillary: 229 mg/dL — ABNORMAL HIGH (ref 70–99)
Glucose-Capillary: 344 mg/dL — ABNORMAL HIGH (ref 70–99)

## 2020-04-06 MED ORDER — HYDRALAZINE HCL 25 MG PO TABS
50.0000 mg | ORAL_TABLET | Freq: Three times a day (TID) | ORAL | Status: DC
  Administered 2020-04-06 – 2020-04-08 (×6): 50 mg via ORAL
  Filled 2020-04-06 (×6): qty 2

## 2020-04-06 MED ORDER — INSULIN GLARGINE 100 UNIT/ML ~~LOC~~ SOLN
35.0000 [IU] | Freq: Every day | SUBCUTANEOUS | Status: DC
  Filled 2020-04-06 (×2): qty 0.35

## 2020-04-06 MED ORDER — LINAGLIPTIN 5 MG PO TABS
5.0000 mg | ORAL_TABLET | Freq: Every day | ORAL | Status: DC
  Administered 2020-04-06 – 2020-04-08 (×3): 5 mg via ORAL
  Filled 2020-04-06 (×3): qty 1

## 2020-04-06 MED ORDER — SODIUM CHLORIDE 0.9 % IV SOLN: INTRAVENOUS | Status: DC

## 2020-04-06 NOTE — Progress Notes (Signed)
1400cc clear yellow urine emptied during in/out cath

## 2020-04-06 NOTE — Progress Notes (Signed)
   Progress Note  Patient Name: Chelsea Meza Date of Encounter: 04/06/2020  Primary Cardiologist: Carlyle Dolly, MD  Cardiology consultation from yesterday reviewed. Case discussed with nursing and chart reviewed.  Blood pressure has been stable, in fact hypertensive recently.  No significant bradycardia or ventricular arrhythmias by telemetry review.  Sinus rhythm with PVCs.  She is currently on aspirin, Coreg, and Lipitor.  Heart rate is in the 41D, systolic 408-144.  Pertinent lab work includes sodium 130, BUN 96, creatinine 3.53, hemoglobin 11.5.  High-sensitivity troponin I of 43.  Echocardiogram shows vigorous LVEF at 65 to 70% with severe LVH and mild diastolic dysfunction, normal RV contraction, trivial pericardial effusion.  We will continue to follow, no further cardiac intervention planned at this time.  Signed, Rozann Lesches, MD  04/06/2020, 10:51 AM

## 2020-04-06 NOTE — Evaluation (Signed)
Physical Therapy Evaluation Patient Details Name: Chelsea Meza MRN: [id number] DOB: May 29, 1935 Today's Date: 04/06/2020   History of Present Illness  84 y.o. female with PMH of HTN, diabetes mellitus type 2, PSVT, CKD stage III, TIA, hyperlipidemia who presents to the emergency department due to elevated blood glucose level and high blood pressure. Since hospitalized, code blue called x2.  Clinical Impression  Pt admitted with above diagnosis. Pt requires min assist to power up from elevated bed, mod assist to power up from low seated toilet. Pt initially slightly unsteady with steps, but improves with upright time. Pt tolerates remaining up in chair at EOS. Attempted to use toilet to void bladder and for bowel movement, but unable- notified nursing. Pt currently with functional limitations due to the deficits listed below (see PT Problem List). Pt will benefit from skilled PT to increase their independence and safety with mobility to allow discharge to the venue listed below.       Follow Up Recommendations Home health PT;Supervision for mobility/OOB;Supervision - Intermittent    Equipment Recommendations  None recommended by PT    Recommendations for Other Services       Precautions / Restrictions Precautions Precautions: Fall Restrictions Weight Bearing Restrictions: No      Mobility  Bed Mobility Overal bed mobility: Needs Assistance Bed Mobility: Supine to Sit;Sit to Supine  Supine to sit: Supervision     General bed mobility comments: increased time, use of bedrail to come to EOB    Transfers Overall transfer level: Needs assistance Equipment used: Rolling walker (2 wheeled) Transfers: Sit to/from Stand Sit to Stand: Min assist;From elevated surface;Mod assist    General transfer comment: rocking momentum, min assist to steady and power up from bed, mod assist to rise from low seated toilet  Ambulation/Gait Ambulation/Gait assistance: Min assist;Min  guard Gait Distance (Feet): 10 Feet Assistive device: Rolling walker (2 wheeled) Gait Pattern/deviations: Step-through pattern;Decreased stride length     General Gait Details: min assist with initial steps to steady improving to min guard assist, ambulates in room with good management of RW, no overt LOB or fall  Stairs            Wheelchair Mobility    Modified Rankin (Stroke Patients Only)       Balance Overall balance assessment: Needs assistance Sitting-balance support: Feet supported;No upper extremity supported Sitting balance-Leahy Scale: Good Sitting balance - Comments: seated EOB   Standing balance support: During functional activity;Bilateral upper extremity supported Standing balance-Leahy Scale: Poor Standing balance comment: reliant on UE support            Pertinent Vitals/Pain Pain Assessment: No/denies pain    Home Living Family/patient expects to be discharged to:: Private residence Living Arrangements: Children (2 daughters) Available Help at Discharge: Family;Available 24 hours/day Type of Home: Apartment Home Access: Level entry     Home Layout: One level Home Equipment: Walker - 2 wheels;Cane - single point;Bedside commode Additional Comments: BSC and RW from deceased client    Prior Function Level of Independence: Independent         Comments: household and community ambulator with [facility], independent with ADLs, denies falls, daughter provides transportation     Hand Dominance        Extremity/Trunk Assessment   Upper Extremity Assessment Upper Extremity Assessment: Overall WFL for tasks assessed    Lower Extremity Assessment Lower Extremity Assessment: Overall WFL for tasks assessed (AROM WNL, strength 4+/5 throughout)    Cervical / Trunk Assessment Cervical /  Trunk Assessment: Normal  Communication   Communication: No difficulties  Cognition Arousal/Alertness: Awake/alert Behavior During Therapy: WFL for tasks  assessed/performed Overall Cognitive Status: Within Functional Limits for tasks assessed  General Comments: pt a&o x4      General Comments      Exercises     Assessment/Plan    PT Assessment Patient needs continued PT services  PT Problem List Decreased activity tolerance;Decreased balance;Decreased knowledge of use of DME       PT Treatment Interventions DME instruction;Gait training;Functional mobility training;Therapeutic activities;Therapeutic exercise;Balance training;Patient/family education    PT Goals (Current goals can be found in the Care Plan section)  Acute Rehab PT Goals Patient Stated Goal: "home with 2 daughters" PT Goal Formulation: With patient Time For Goal Achievement: 04/20/20 Potential to Achieve Goals: Good    Frequency Min 3X/week   Barriers to discharge        Co-evaluation               AM-PAC PT "6 Clicks" Mobility  Outcome Measure Help needed turning from your back to your side while in a flat bed without using bedrails?: None Help needed moving from lying on your back to sitting on the side of a flat bed without using bedrails?: None Help needed moving to and from a bed to a chair (including a wheelchair)?: A Little Help needed standing up from a chair using your arms (e.g., wheelchair or bedside chair)?: A Little Help needed to walk in hospital room?: A Little Help needed climbing 3-5 steps with a railing? : A Lot 6 Click Score: 19    End of Session Equipment Utilized During Treatment: Gait belt Activity Tolerance: Patient tolerated treatment well Patient left: in chair;with call bell/phone within reach Nurse Communication: Mobility status PT Visit Diagnosis: Unsteadiness on feet (R26.81);Other abnormalities of gait and mobility (R26.89)    Time: 1359-1431 PT Time Calculation (min) (ACUTE ONLY): 32 min   Charges:   PT Evaluation $PT Eval Moderate Complexity: 1 Mod PT Treatments $Therapeutic Activity: 8-22 mins          Talbot Grumbling PT, DPT 04/06/20, 2:41 PM [phone]

## 2020-04-06 NOTE — Progress Notes (Signed)
Bladder scan on pt reveals >975ml in bladder. Pt is unable to urinate at this time. In/out cath done per protocol

## 2020-04-06 NOTE — Plan of Care (Signed)
  Problem: Acute Rehab PT Goals(only PT should resolve) Goal: Pt Will Go Supine/Side To Sit Outcome: Progressing Flowsheets (Taken 04/06/2020 1443) Pt will go Supine/Side to Sit: with modified independence Goal: Patient Will Transfer Sit To/From Stand Outcome: Progressing Flowsheets (Taken 04/06/2020 1443) Patient will transfer sit to/from stand: with modified independence Goal: Pt Will Transfer Bed To Chair/Chair To Bed Outcome: Progressing Flowsheets (Taken 04/06/2020 1443) Pt will Transfer Bed to Chair/Chair to Bed: with modified independence Goal: Pt Will Ambulate Outcome: Progressing Flowsheets (Taken 04/06/2020 1443) Pt will Ambulate:  > 125 feet  with min guard assist  with least restrictive assistive device Goal: Pt/caregiver will Perform Home Exercise Program Outcome: Progressing Flowsheets (Taken 04/06/2020 1443) Pt/caregiver will Perform Home Exercise Program:  For increased strengthening  For improved balance  Independently   Tori Jaren Kearn PT, DPT 04/06/20, 2:43 PM [phone]

## 2020-04-06 NOTE — Progress Notes (Signed)
[facility] PROGRESS NOTE    Chelsea Meza  DVV:[id number] DOB: [birth year] DOA: 04/04/2020 PCP: Iona Beard, MD      Brief Narrative:  Mrs. Chelsea Meza is a 84 y.o. F with CKD IV baseline Cr 1.9, DM, TIA, and HTN who presented with hyperglycemic and hypertensive crisis as well as renal failure.  Patient had had merely vague constitutional complains.  Evidently ran out of her medications several weeks ago and had not had them filled yet.  Since Thanksgiving, her BP and glucoses were up, her legs were swollen and had started to weep, she was peeing often, and finally just felt sick and malaise and so came to the ER.  In the ER, glucose >800, Cr >5, and BP was 229/107.  She was started on antihypertensives, insulin drip and fluids.  After administration of several BP meds at once, the patient lost a pulse and became unresponsive.  CODE BLUE was called x2 (first time she was given few minutes of CPR and patient awoke spontaneously, second time atropine and epinephrine were given and she improved).  Admitted to ICU on epinephrine drip, insulin drip and IV fluids.           Assessment & Plan:  Diabetes with hyperglycemia Diabetic crisis with hyperglycemia without ketosis Uncontrolled diabetes A1c >13%.  Patient admitted with glucoses >800, suffered cardiac arrest, PEA arrest in the ER due to resumption of her home medications as well as hypovolemia in setting of prolonged hyperglycemia.    Glucoses low yesterday but improving and somewhat elevated today -Continue Lantus, home dose -Continue sliding scale corrections -Resume DPP 4 inhibitor      Cardiac arrest, likely PEA in setting of hypovolemic shock Acute respiratory failure in setting of renal failure, cardiac arrest As above, these arrests occurred in the setting of hypotension/hypovolemic shock (from profound hyperglycemic diuresis and then home medication resumption).  At the time of her second cardiac  arrest, patient had respiratory arrest, hypoxia to 40s.    For her hypovolemic shock, she did require vasopressors, but these were weaned off within 24 hours.  No sepsis.  Echocardiogram shows normal EF, normal valves, no RWMA.  Cardiology plan no further work up.    Acute renal failure on CKD stage IV Baseline Cr 2.5 from last August, CKD IV.  AKI in setting of hyperglycemia, dehydration, Lasix and spironolactone use.  Cr peaked >5.  Now down to mid-3s.  UA bland, renal US unremarkable. -Hold diuretics -Continue IV fluids -Trend Cr  Hypertension Hypertensive crisis -Continue carvedilol -Resume hydralazine -Hold verapamil due to bradycardia -Hold spironolactone due to renal failure   Cerebrovascular disease, secondary prevention -Continue aspirin, atorvastatin  Glaucoma -Continue eye drops   Hyponatremia Mild.       Disposition: Status is: INPATIENT  The patient will require care spanning > 2 midnights and should be moved to inpatient because: Persistent severe electrolyte disturbances, IV treatments appropriate due to intensity of illness or inability to take PO and Inpatient level of care appropriate due to severity of illness  Dispo: The patient is from: Home              Anticipated d/c is to: Home              Anticipated d/c date is: 1 day              Patient currently is not medically stable to d/c.    Patient minute with PEA arrest, hypovolemic shock, all due to  severe hyperglycemia due to nonadherence to medications.  No resume most of her home medications, and her glucoses and blood pressure seem to be improving to normal ranges.  If her renal function continues to improve tomorrow, likely home with home health.          MDM: The patient is critically ill with multi-organ failure.  Critical care was necessary to treat or prevent imminent or life-threatening deterioration of cardiac failure, renal failure and was exclusive of separately billable  procedures and treating other patients. Total critical care time spent by me: 45 minutes Time spent personally by me on obtaining history from patient or surrogate, evaluation of the patient, evaluation of patient's response to treatment, ordering and review of laboratory studies, ordering and review of radiographic studies, ordering and performing treatments and interventions, and re-evaluation of the patient's condition.    DVT prophylaxis: SCDs Start: 04/05/20 0106  Code Status: FULL Family Communication: Called to daughter, no answer    Consultants:   CCM        Subjective: Patient is feeling much better.  No chest pain, dyspnea, cough, fever, sputum.  No confusion.  Objective: Vitals:   04/06/20 1000 04/06/20 1134 04/06/20 1500 04/06/20 1712  BP: (!) 160/82  126/73 (!) 168/82  Pulse: 94  79 80  Resp: 16     Temp:  98 F (36.7 C)    TempSrc:  Oral    SpO2: 98%  99%   Weight:      Height:        Intake/Output Summary (Last 24 hours) at 04/06/2020 1934 Last data filed at 04/06/2020 0800 Gross per 24 hour  Intake 861.67 ml  Output 1400 ml  Net -538.33 ml   Filed Weights   04/04/20 2216 04/05/20 0203  Weight: 86 kg 69.4 kg    Examination: General appearance: Elderly adult female, lying in bed, interactive, eating breakfast.     HEENT: Anicteric, conjunctival pink, lids and lashes normal.  No nasal deformity, discharge, or epistaxis.  Lips moist, dentures in place, oropharynx moist, no oral lesions, hearing diminished Skin: Skin warm and dry, no suspicious rashes or lesions. Cardiac: Regular rate and rhythm, no murmurs, JVP not visible, no lower extremity edema Respiratory: Normal respiratory rate and rhythm, lungs clear without rales or wheezes Abdomen: Abdomen soft no tenderness palpation or guarding, no ascites or distention MSK: Diffuse loss of subcutaneous muscle mass and fat. Neuro: Awake and alert, extraocular movement intact, moves all extremities  generalized weakness, symmetric strength.  Speech fluent Psych: Sensorium intact responding to questions, attention normal, affect normal, judgment insight normal    Data Reviewed: I have personally reviewed following labs and imaging studies:  CBC: Recent Labs  Lab 04/04/20 2230 04/05/20 0430 04/06/20 1031  WBC 8.3 11.8* 8.9  NEUTROABS 5.0  --   --   HGB 12.8 11.7* 11.5*  HCT 39.2 36.4 35.7*  MCV 86.2 86.9 86.9  PLT 203 195 212   Basic Metabolic Panel: Recent Labs  Lab 04/05/20 0011 04/05/20 0430 04/05/20 1029 04/05/20 1803 04/06/20 1031  NA 120* 127* 130* 133* 130*  K 4.1 3.9 4.8 3.9 3.7  CL 85* 92* 97* 99 99  CO2 21* 19* 18* 22 21*  GLUCOSE 624* 277* 155* 71 170*  BUN 109* 117* 114* 100* 96*  CREATININE 4.83* 4.74* 4.69* 4.07* 3.53*  CALCIUM 9.6 9.2 8.9 9.1 8.9  MG  --  2.0  --   --   --   PHOS  --  3.7  --   --   --    GFR: Estimated Creatinine Clearance: 12.8 mL/min (A) (by C-G formula based on SCr of 3.53 mg/dL (H)). Liver Function Tests: Recent Labs  Lab 04/04/20 2230 04/05/20 0430  AST 23 37  ALT 15 22  ALKPHOS 115 103  BILITOT 0.7 0.8  PROT 8.2* 7.3  ALBUMIN 4.3 3.7   No results for input(s): LIPASE, AMYLASE in the last 168 hours. No results for input(s): AMMONIA in the last 168 hours. Coagulation Profile: Recent Labs  Lab 04/05/20 0430  INR 1.0   Cardiac Enzymes: No results for input(s): CKTOTAL, CKMB, CKMBINDEX, TROPONINI in the last 168 hours. BNP (last 3 results) No results for input(s): PROBNP in the last 8760 hours. HbA1C: Recent Labs    04/05/20 0430  HGBA1C 13.6*   CBG: Recent Labs  Lab 04/06/20 0053 04/06/20 0418 04/06/20 0750 04/06/20 1133 04/06/20 1624  GLUCAP 200* 176* 104* 344* 229*   Lipid Profile: No results for input(s): CHOL, HDL, LDLCALC, TRIG, CHOLHDL, LDLDIRECT in the last 72 hours. Thyroid Function Tests: No results for input(s): TSH, T4TOTAL, FREET4, T3FREE, THYROIDAB in the last 72 hours. Anemia  Panel: No results for input(s): VITAMINB12, FOLATE, FERRITIN, TIBC, IRON, RETICCTPCT in the last 72 hours. Urine analysis:    Component Value Date/Time   COLORURINE YELLOW 04/06/2020 0550   APPEARANCEUR HAZY (A) 04/06/2020 0550   LABSPEC 1.012 04/06/2020 0550   PHURINE 5.0 04/06/2020 0550   GLUCOSEU 50 (A) 04/06/2020 0550   HGBUR NEGATIVE 04/06/2020 0550   BILIRUBINUR NEGATIVE 04/06/2020 0550   KETONESUR NEGATIVE 04/06/2020 0550   PROTEINUR 30 (A) 04/06/2020 0550   UROBILINOGEN 0.2 07/13/2012 1236   NITRITE NEGATIVE 04/06/2020 0550   LEUKOCYTESUR MODERATE (A) 04/06/2020 0550   Sepsis Labs: @LABRCNTIP (procalcitonin:4,lacticacidven:4)  ) Recent Results (from the past 240 hour(s))  Resp Panel by RT-PCR (Flu A&B, Covid) Nasopharyngeal Swab     Status: None   Collection Time: 04/04/20 10:30 PM   Specimen: Nasopharyngeal Swab; Nasopharyngeal(NP) swabs in vial transport medium  Result Value Ref Range Status   SARS Coronavirus 2 by RT PCR NEGATIVE NEGATIVE Final    Comment: (NOTE) SARS-CoV-2 target nucleic acids are NOT DETECTED.  The SARS-CoV-2 RNA is generally detectable in upper respiratory specimens during the acute phase of infection. The lowest concentration of SARS-CoV-2 viral copies this assay can detect is 138 copies/mL. A negative result does not preclude SARS-Cov-2 infection and should not be used as the sole basis for treatment or other patient management decisions. A negative result may occur with  improper specimen collection/handling, submission of specimen other than nasopharyngeal swab, presence of viral mutation(s) within the areas targeted by this assay, and inadequate number of viral copies(<138 copies/mL). A negative result must be combined with clinical observations, patient history, and epidemiological information. The expected result is Negative.  Fact Sheet for Patients:  EntrepreneurPulse.com.au  Fact Sheet for Healthcare Providers:   IncredibleEmployment.be  This test is no t yet approved or cleared by the [place] FDA and  has been authorized for detection and/or diagnosis of SARS-CoV-2 by FDA under an Emergency Use Authorization (EUA). This EUA will remain  in effect (meaning this test can be used) for the duration of the COVID-19 declaration under Section 564(b)(1) of the Act, 21 U.S.C.section 360bbb-3(b)(1), unless the authorization is terminated  or revoked sooner.       Influenza A by PCR NEGATIVE NEGATIVE Final   Influenza B by PCR NEGATIVE NEGATIVE Final  Comment: (NOTE) The Xpert Xpress SARS-CoV-2/FLU/RSV plus assay is intended as an aid in the diagnosis of influenza from Nasopharyngeal swab specimens and should not be used as a sole basis for treatment. Nasal washings and aspirates are unacceptable for Xpert Xpress SARS-CoV-2/FLU/RSV testing.  Fact Sheet for Patients: EntrepreneurPulse.com.au  Fact Sheet for Healthcare Providers: IncredibleEmployment.be  This test is not yet approved or cleared by the [place] FDA and has been authorized for detection and/or diagnosis of SARS-CoV-2 by FDA under an Emergency Use Authorization (EUA). This EUA will remain in effect (meaning this test can be used) for the duration of the COVID-19 declaration under Section 564(b)(1) of the Act, 21 U.S.C. section 360bbb-3(b)(1), unless the authorization is terminated or revoked.  Performed at [facility], [address] [place], [place] [address]   MRSA PCR Screening     Status: None   Collection Time: 04/05/20  1:55 AM   Specimen: Nasal Mucosa; Nasopharyngeal  Result Value Ref Range Status   MRSA by PCR NEGATIVE NEGATIVE Final    Comment:        The GeneXpert MRSA Assay (FDA approved for NASAL specimens only), is one component of a comprehensive MRSA colonization surveillance program. It is not intended to diagnose MRSA infection nor to guide  or monitor treatment for MRSA infections. Performed at [facility], [address] [place], [place] [address]          Radiology Studies: US RENAL  Result Date: 04/05/2020 CLINICAL DATA:  Acute kidney injury EXAM: RENAL / URINARY TRACT ULTRASOUND COMPLETE COMPARISON:  12/12/2019 FINDINGS: Right Kidney: Renal measurements: 10.6 x 4.1 x 5 cm = volume: 112 mL. Cortex is slightly echogenic. No hydronephrosis or mass Left Kidney: Renal measurements: 10.2 x 4.6 x 5.1 cm = volume: 125 mL. Cortex is slightly echogenic. No hydronephrosis or mass. Bladder: Appears normal for degree of bladder distention. Bladder volume of 473 cubic cm at the time of imaging Other: None. IMPRESSION: Echogenic kidneys consistent with medical renal disease. No hydronephrosis. Electronically Signed   By: Donavan Foil M.D.   On: 04/05/2020 19:49   ECHOCARDIOGRAM COMPLETE  Result Date: 04/05/2020    ECHOCARDIOGRAM REPORT   Patient Name:   TEXIE TUPOU Date of Exam: 04/05/2020 Medical Rec #:  [id number]         Height:       69.0 in Accession #:    [id number]        Weight:       153.0 lb Date of Birth:  07/15/1935         BSA:          1.844 m Patient Age:    62 years          BP:           145/79 mmHg Patient Gender: F                 HR:           69 bpm. Exam Location:  [facility] Procedure: 2D Echo, Cardiac Doppler and Color Doppler Indications:    Cardiac arrest I46.9  History:        Patient has prior history of Echocardiogram examinations, most                 recent 10/20/2018. CHF, TIA; Risk Factors:Hypertension, Diabetes                 and Dyslipidemia. CKD (chronic kidney disease) stage 3, GFR  30-59 ml/min.  Sonographer:    Alvino Chapel RCS Referring Phys: [id number] Rosalia  1. Left ventricular ejection fraction, by estimation, is 65 to 70%. The left ventricle has normal function. The left ventricle has no regional wall motion abnormalities. There is severe left ventricular  hypertrophy. Left ventricular diastolic parameters  are consistent with Grade I diastolic dysfunction (impaired relaxation). Elevated left atrial pressure.  2. Right ventricular systolic function is normal. The right ventricular size is normal.  3. Left atrial size was moderately dilated.  4. The pericardial effusion is circumferential.  5. The mitral valve is normal in structure. Trivial mitral valve regurgitation. No evidence of mitral stenosis.  6. The aortic valve is tricuspid. Aortic valve regurgitation is not visualized. No aortic stenosis is present.  7. The inferior vena cava is normal in size with greater than 50% respiratory variability, suggesting right atrial pressure of 3 mmHg. FINDINGS  Left Ventricle: Left ventricular ejection fraction, by estimation, is 65 to 70%. The left ventricle has normal function. The left ventricle has no regional wall motion abnormalities. The left ventricular internal cavity size was normal in size. There is  severe left ventricular hypertrophy. Left ventricular diastolic parameters are consistent with Grade I diastolic dysfunction (impaired relaxation). Elevated left atrial pressure. Right Ventricle: The right ventricular size is normal. No increase in right ventricular wall thickness. Right ventricular systolic function is normal. Left Atrium: Left atrial size was moderately dilated. Right Atrium: Right atrial size was not well visualized. Pericardium: Trivial pericardial effusion is present. The pericardial effusion is circumferential. Mitral Valve: The mitral valve is normal in structure. Trivial mitral valve regurgitation. No evidence of mitral valve stenosis. Tricuspid Valve: The tricuspid valve is normal in structure. Tricuspid valve regurgitation is not demonstrated. No evidence of tricuspid stenosis. Aortic Valve: The aortic valve is tricuspid. Aortic valve regurgitation is not visualized. No aortic stenosis is present. Aortic valve mean gradient measures 4.1 mmHg.  Aortic valve peak gradient measures 7.9 mmHg. Aortic valve area, by VTI measures 2.08 cm. Pulmonic Valve: The pulmonic valve was not well visualized. Pulmonic valve regurgitation is mild. No evidence of pulmonic stenosis. Aorta: The aortic root is normal in size and structure. Pulmonary Artery: Indeterminant PASP, inadequate TR jet. Venous: The inferior vena cava is normal in size with greater than 50% respiratory variability, suggesting right atrial pressure of 3 mmHg. IAS/Shunts: The interatrial septum was not well visualized.  LEFT VENTRICLE PLAX 2D LVIDd:         3.60 cm  Diastology LVIDs:         2.10 cm  LV e' medial:    3.59 cm/s LV PW:         1.70 cm  LV E/e' medial:  17.1 LV IVS:        1.60 cm  LV e' lateral:   5.33 cm/s LVOT diam:     1.90 cm  LV E/e' lateral: 11.5 LV SV:         52 LV SV Index:   28 LVOT Area:     2.84 cm  RIGHT VENTRICLE RV S prime:     13.70 cm/s TAPSE (M-mode): 1.8 cm LEFT ATRIUM             Index       RIGHT ATRIUM           Index LA diam:        3.60 cm 1.95 cm/m  RA Area:     15.50 cm  LA Vol (A2C):   55.2 ml 29.94 ml/m RA Volume:   39.00 ml  21.15 ml/m LA Vol (A4C):   66.7 ml 36.18 ml/m LA Biplane Vol: 63.5 ml 34.44 ml/m  AORTIC VALVE AV Area (Vmax):    1.94 cm AV Area (Vmean):   2.01 cm AV Area (VTI):     2.08 cm AV Vmax:           140.62 cm/s AV Vmean:          93.727 cm/s AV VTI:            0.248 m AV Peak Grad:      7.9 mmHg AV Mean Grad:      4.1 mmHg LVOT Vmax:         96.40 cm/s LVOT Vmean:        66.600 cm/s LVOT VTI:          0.182 m LVOT/AV VTI ratio: 0.74  AORTA Ao Root diam: 3.80 cm MITRAL VALVE MV Area (PHT): 1.86 cm    SHUNTS MV Decel Time: 408 msec    Systemic VTI:  0.18 m MV E velocity: 61.40 cm/s  Systemic Diam: 1.90 cm MV A velocity: 85.20 cm/s MV E/A ratio:  0.72 Carlyle Dolly MD Electronically signed by Carlyle Dolly MD Signature Date/Time: 04/05/2020/1:09:17 PM    Final         Scheduled Meds: . aspirin EC  81 mg Oral Daily  .  atorvastatin  40 mg Oral QHS  . carvedilol  6.25 mg Oral BID WC  . Chlorhexidine Gluconate Cloth  6 each Topical Daily  . hydrALAZINE  50 mg Oral Q8H  . insulin aspart  0-5 Units Subcutaneous QHS  . [START ON 04/07/2020] insulin glargine  35 Units Subcutaneous Daily  . ketorolac  1 drop Left Eye QID  . linagliptin  5 mg Oral Daily  . ofloxacin  1 drop Left Eye QID   Continuous Infusions: . sodium chloride       LOS: 1 day    Time spent: 45 minutes    Edwin Dada, MD [facility] 04/06/2020, 7:34 PM     Please page though [place] or Epic secure chat:  For [employer], Adult nurse

## 2020-04-06 NOTE — Progress Notes (Signed)
Inpatient Diabetes Program Recommendations  AACE/ADA: New Consensus Statement on Inpatient Glycemic Control   Target Ranges:  Prepandial:   less than 140 mg/dL      Peak postprandial:   less than 180 mg/dL (1-2 hours)      Critically ill patients:  140 - 180 mg/dL  Results for Chelsea Meza, Chelsea Meza (MRN [id number]) as of 04/06/2020 11:33  Ref. Range 04/05/2020 07:55 04/05/2020 11:16 04/05/2020 16:23 04/05/2020 17:31 04/05/2020 20:23 04/05/2020 21:26 04/06/2020 00:53 04/06/2020 04:18 04/06/2020 07:50 04/06/2020 11:33  Glucose-Capillary Latest Ref Range: 70 - 99 mg/dL 174 (H) 154 (H) 64 (L) 87 49 (L) 65 (L) 200 (H) 176 (H) 104 (H) 344 (H)   Results for Chelsea Meza, Chelsea Meza (MRN [id number]) as of 04/06/2020 11:33  Ref. Range 10/17/2018 23:25 04/05/2020 04:30  Hemoglobin A1C Latest Ref Range: 4.8 - 5.6 % 14.1 (H) 13.6 (H)   Review of Glycemic Control  Diabetes history: DM2 Outpatient Diabetes medications: Lantus 60 units daily, Januvia 50 mg daily Current orders for Inpatient glycemic control: Lantus 20 units daily, Novolog 0-9 units TID with meals, Novolog 0-5 units QHS  Inpatient Diabetes Program Recommendations:    Insulin: IF post prandial glucose remains consistently over 180 mg/dl, may want to consider ordering Novolog 3 units TID with meals for meal coverage if patient eats at least 50% of meals.  NOTE: Spoke with patient over the phone about diabetes and home regimen for diabetes control. Patient reports being followed by PCP for diabetes management. Patient states she had an appointment with PCP yesterday but she missed the appointment due to being at the hospital. Patient states that she is taking Lantus 60 units daily and Januvia 50 mg daily as an outpatient for diabetes control. Patient reports taking DM medications as prescribed and states that her Lantus dose was recently increased from 30 to 60 units daily. Patient confirms that she was out of DM medications for about 2 weeks but she notes that she  had medications refilled and she now has all needed DM medications and supplies.  Patient reports checking glucose 1 time per day and she notes that her glucose has been running higher since Thanksgiving and she notes that it has read "HI" on glucometer over the past week.  Discussed A1C results (13.6% on 04/05/20) and explained that current A1C indicates an average glucose of 344 mg/dl over the past 2-3 months.  Discussed importance of checking CBGs and maintaining good CBG control to prevent long-term and short-term complications. Patient states that she is planning to follow up with PCP as soon as she can and she plans to talk with him about DM control. Inquired about family going with her to appointments and she states that her daughter goes with her to appointments and that her daughter is a Marine scientist. Encouraged patient to check glucose 3-4 times per day and be sure to take her glucometer with her to appointments.  Explained how the doctor can use the glucose values to continue to make adjustments with DM medications if needed.  Patient verbalized understanding of information discussed and reports no further questions at this time related to diabetes.  Thanks, Barnie Alderman, RN, MSN, CDE Diabetes Coordinator Inpatient Diabetes Program [phone] (Team Pager)

## 2020-04-07 LAB — CBC
HCT: 31.4 % — ABNORMAL LOW (ref 36.0–46.0)
Hemoglobin: 10.2 g/dL — ABNORMAL LOW (ref 12.0–15.0)
MCH: 28.3 pg (ref 26.0–34.0)
MCHC: 32.5 g/dL (ref 30.0–36.0)
MCV: 87 fL (ref 80.0–100.0)
Platelets: 157 10*3/uL (ref 150–400)
RBC: 3.61 MIL/uL — ABNORMAL LOW (ref 3.87–5.11)
RDW: 12.5 % (ref 11.5–15.5)
WBC: 9.2 10*3/uL (ref 4.0–10.5)
nRBC: 0 % (ref 0.0–0.2)

## 2020-04-07 LAB — BASIC METABOLIC PANEL
Anion gap: 10 (ref 5–15)
BUN: 97 mg/dL — ABNORMAL HIGH (ref 8–23)
CO2: 18 mmol/L — ABNORMAL LOW (ref 22–32)
Calcium: 8.5 mg/dL — ABNORMAL LOW (ref 8.9–10.3)
Chloride: 100 mmol/L (ref 98–111)
Creatinine, Ser: 3.34 mg/dL — ABNORMAL HIGH (ref 0.44–1.00)
GFR, Estimated: 13 mL/min — ABNORMAL LOW (ref >60)
Glucose, Bld: 157 mg/dL — ABNORMAL HIGH (ref 70–99)
Potassium: 4.1 mmol/L (ref 3.5–5.1)
Sodium: 128 mmol/L — ABNORMAL LOW (ref 135–145)

## 2020-04-07 LAB — GLUCOSE, CAPILLARY
Glucose-Capillary: 156 mg/dL — ABNORMAL HIGH (ref 70–99)
Glucose-Capillary: 303 mg/dL — ABNORMAL HIGH (ref 70–99)
Glucose-Capillary: 256 mg/dL — ABNORMAL HIGH (ref 70–99)
Glucose-Capillary: 256 mg/dL — ABNORMAL HIGH (ref 70–99)

## 2020-04-07 MED ORDER — INSULIN ASPART 100 UNIT/ML ~~LOC~~ SOLN
3.0000 [IU] | Freq: Three times a day (TID) | SUBCUTANEOUS | Status: DC
  Administered 2020-04-07 – 2020-04-08 (×4): 3 [IU] via SUBCUTANEOUS

## 2020-04-07 MED ORDER — INSULIN GLARGINE 100 UNIT/ML ~~LOC~~ SOLN
20.0000 [IU] | Freq: Every day | SUBCUTANEOUS | Status: DC
  Administered 2020-04-07: 20 [IU] via SUBCUTANEOUS
  Filled 2020-04-07 (×2): qty 0.2

## 2020-04-07 NOTE — Progress Notes (Signed)
PROGRESS NOTE    Chelsea Meza  VPX:[id number] DOB: February 23, 1936 DOA: 04/04/2020 PCP: Iona Beard, MD    Brief Narrative:  84 year old female with history of CKD stage IV with baseline creatinine about 1.9, type 2 diabetes on insulin, TIA, hypertension presented from home with hyperglycemia and hypertensive crisis as well as renal failure.  Patient had not been taking her medications for at least 2 weeks and had recently started.  Since Thanksgiving, her blood pressure and glucose were up her legs were swollen, felt sick so came to ER.  At the emergency room, blood glucose more than 800, creatinine more than 5 and blood pressure was 229/107.  She was started on multiple antihypertensive medications insulin drip and fluids.  After administration of several blood pressure medicine at once she lost her pulse and became unresponsive.  She had a CODE BLUE called x2 with a spontaneous recovery.  2nd episode she was started on atropine and epinephrine transiently with a spontaneous improvement.   Assessment & Plan:   Principal Problem:   Hyperosmolar hyperglycemic state (HHS) ([place]) Active Problems:   DKA, type 2 ([place])   Acute kidney injury superimposed on CKD ([place])   Essential hypertension, benign   Lactic acidosis   Hyponatremia   HLD (hyperlipidemia)   Hypertensive urgency   Cardiac arrest ([place])  PEA arrest in the setting of hypovolemic shock, acute respiratory failure in the setting of renal failure and cardiac arrest: Spontaneously improved.  Initially treated with vasopressors and weaned off.  No evidence of sepsis.  Echocardiogram with normal ejection fraction, normal valves, no evidence of regional wall abnormalities. Followed by cardiology, they recommended no need for further cardiac invasive testing.  Diabetes with hyperglycemia, hyperglycemic crisis without DKA in a patient with uncontrolled diabetes: Recently not using insulin at home. Hemoglobin A1c more than 13.  Admitted  with blood sugars of more than 800.  Initially treated with insulin drip.  Now variable blood sugars. Currently remains on insulin Lantus 20 units once a day, NovoLog 3 units with meals.  We'll continue to titrate insulin doses in order to create an appropriate home regimen. Also on Januvia that she will continue on discharge.  Acute renal failure with history of CKD stage IV: Reported baseline creatinine about 1.9-2.5.  She does follow-up with Dr. Theador Hawthorne in the clinic.  Was on maintenance IV fluids. Discontinue IV fluid today, recheck levels tomorrow morning.  If continues to improve will resume low-dose Lasix and discharged home with follow-up with her nephrologist.  She is euvolemic today.  Essential hypertension with hypertensive crisis: Continue Coreg, hydralazine.  She had a bradycardic event so beta family is on hold.  Holding Aldactone for acute renal failure.  Blood sugars are acceptable today.  Continue to mobilize with therapies today.  Recheck her renal functions tomorrow morning.  If is stable, she will be able to go home with home health PT OT tomorrow.   DVT prophylaxis: SCDs Start: 04/05/20 0106   Code Status: Full code Family Communication: Called the number for daughter, unable to reach Disposition Plan: Status is: Inpatient  Remains inpatient appropriate because:Inpatient level of care appropriate due to severity of illness   Dispo: The patient is from: Home              Anticipated d/c is to: Home              Anticipated d/c date is: 2 days  Patient currently is not medically stable to d/c.         Consultants:   Cardiology  Procedures:   None  Antimicrobials:   None   Subjective: Patient was seen and examined.  No overnight events.  Blood sugars are inconsistent with some blood sugar readings less than 150.  She has good appetite.  Denies any chest pain or palpitations.  Telemetry without events.  Objective: Vitals:   04/06/20  1712 04/06/20 2016 04/07/20 0002 04/07/20 0401  BP: (!) 168/82 (!) 158/82 132/66 (!) 159/74  Pulse: 80 82 77 78  Resp:  18 20 20   Temp:  98.4 F (36.9 C) 97.6 F (36.4 C) 97.9 F (36.6 C)  TempSrc:  Oral Oral Oral  SpO2:  100% 99% 100%  Weight:      Height:        Intake/Output Summary (Last 24 hours) at 04/07/2020 1131 Last data filed at 04/07/2020 0900 Gross per 24 hour  Intake 1166.66 ml  Output --  Net 1166.66 ml   Filed Weights   04/04/20 2216 04/05/20 0203  Weight: 86 kg 69.4 kg    Examination:  General exam: Appears calm and comfortable  mostly on room air. Respiratory system: Clear to auscultation. Respiratory effort normal.  No added sounds. Cardiovascular system: S1 & S2 heard, RRR. No JVD, murmurs, rubs, gallops or clicks.  1+ bilateral pedal edema. Gastrointestinal system: Abdomen is nondistended, soft and nontender. No organomegaly or masses felt. Normal bowel sounds heard. Central nervous system: Alert and oriented. No focal neurological deficits. Extremities: Symmetric 5 x 5 power. Skin: No rashes, lesions or ulcers Psychiatry: Judgement and insight appear normal. Mood & affect appropriate.     Data Reviewed: I have personally reviewed following labs and imaging studies  CBC: Recent Labs  Lab 04/04/20 2230 04/05/20 0430 04/06/20 1031 04/07/20 0511  WBC 8.3 11.8* 8.9 9.2  NEUTROABS 5.0  --   --   --   HGB 12.8 11.7* 11.5* 10.2*  HCT 39.2 36.4 35.7* 31.4*  MCV 86.2 86.9 86.9 87.0  PLT 203 195 183 500   Basic Metabolic Panel: Recent Labs  Lab 04/05/20 0430 04/05/20 1029 04/05/20 1803 04/06/20 1031 04/07/20 0511  NA 127* 130* 133* 130* 128*  K 3.9 4.8 3.9 3.7 4.1  CL 92* 97* 99 99 100  CO2 19* 18* 22 21* 18*  GLUCOSE 277* 155* 71 170* 157*  BUN 117* 114* 100* 96* 97*  CREATININE 4.74* 4.69* 4.07* 3.53* 3.34*  CALCIUM 9.2 8.9 9.1 8.9 8.5*  MG 2.0  --   --   --   --   PHOS 3.7  --   --   --   --    GFR: Estimated Creatinine Clearance:  13.6 mL/min (A) (by C-G formula based on SCr of 3.34 mg/dL (H)). Liver Function Tests: Recent Labs  Lab 04/04/20 2230 04/05/20 0430  AST 23 37  ALT 15 22  ALKPHOS 115 103  BILITOT 0.7 0.8  PROT 8.2* 7.3  ALBUMIN 4.3 3.7   No results for input(s): LIPASE, AMYLASE in the last 168 hours. No results for input(s): AMMONIA in the last 168 hours. Coagulation Profile: Recent Labs  Lab 04/05/20 0430  INR 1.0   Cardiac Enzymes: No results for input(s): CKTOTAL, CKMB, CKMBINDEX, TROPONINI in the last 168 hours. BNP (last 3 results) No results for input(s): PROBNP in the last 8760 hours. HbA1C: Recent Labs    04/05/20 0430  HGBA1C 13.6*   CBG: Recent  Labs  Lab 04/06/20 1133 04/06/20 1624 04/06/20 2041 04/07/20 0733 04/07/20 1118  GLUCAP 344* 229* 193* 156* 303*   Lipid Profile: No results for input(s): CHOL, HDL, LDLCALC, TRIG, CHOLHDL, LDLDIRECT in the last 72 hours. Thyroid Function Tests: No results for input(s): TSH, T4TOTAL, FREET4, T3FREE, THYROIDAB in the last 72 hours. Anemia Panel: No results for input(s): VITAMINB12, FOLATE, FERRITIN, TIBC, IRON, RETICCTPCT in the last 72 hours. Sepsis Labs: Recent Labs  Lab 04/05/20 0011 04/05/20 0430 04/05/20 1029  LATICACIDVEN 2.8* 4.8* 3.5*    Recent Results (from the past 240 hour(s))  Resp Panel by RT-PCR (Flu A&B, Covid) Nasopharyngeal Swab     Status: None   Collection Time: 04/04/20 10:30 PM   Specimen: Nasopharyngeal Swab; Nasopharyngeal(NP) swabs in vial transport medium  Result Value Ref Range Status   SARS Coronavirus 2 by RT PCR NEGATIVE NEGATIVE Final    Comment: (NOTE) SARS-CoV-2 target nucleic acids are NOT DETECTED.  The SARS-CoV-2 RNA is generally detectable in upper respiratory specimens during the acute phase of infection. The lowest concentration of SARS-CoV-2 viral copies this assay can detect is 138 copies/mL. A negative result does not preclude SARS-Cov-2 infection and should not be used as  the sole basis for treatment or other patient management decisions. A negative result may occur with  improper specimen collection/handling, submission of specimen other than nasopharyngeal swab, presence of viral mutation(s) within the areas targeted by this assay, and inadequate number of viral copies(<138 copies/mL). A negative result must be combined with clinical observations, patient history, and epidemiological information. The expected result is Negative.  Fact Sheet for Patients:  EntrepreneurPulse.com.au  Fact Sheet for Healthcare Providers:  IncredibleEmployment.be  This test is no t yet approved or cleared by the [place] FDA and  has been authorized for detection and/or diagnosis of SARS-CoV-2 by FDA under an Emergency Use Authorization (EUA). This EUA will remain  in effect (meaning this test can be used) for the duration of the COVID-19 declaration under Section 564(b)(1) of the Act, 21 U.S.C.section 360bbb-3(b)(1), unless the authorization is terminated  or revoked sooner.       Influenza A by PCR NEGATIVE NEGATIVE Final   Influenza B by PCR NEGATIVE NEGATIVE Final    Comment: (NOTE) The Xpert Xpress SARS-CoV-2/FLU/RSV plus assay is intended as an aid in the diagnosis of influenza from Nasopharyngeal swab specimens and should not be used as a sole basis for treatment. Nasal washings and aspirates are unacceptable for Xpert Xpress SARS-CoV-2/FLU/RSV testing.  Fact Sheet for Patients: EntrepreneurPulse.com.au  Fact Sheet for Healthcare Providers: IncredibleEmployment.be  This test is not yet approved or cleared by the [place] FDA and has been authorized for detection and/or diagnosis of SARS-CoV-2 by FDA under an Emergency Use Authorization (EUA). This EUA will remain in effect (meaning this test can be used) for the duration of the COVID-19 declaration under Section 564(b)(1) of  the Act, 21 U.S.C. section 360bbb-3(b)(1), unless the authorization is terminated or revoked.  Performed at [facility], [address] [place], [place] [address]   MRSA PCR Screening     Status: None   Collection Time: 04/05/20  1:55 AM   Specimen: Nasal Mucosa; Nasopharyngeal  Result Value Ref Range Status   MRSA by PCR NEGATIVE NEGATIVE Final    Comment:        The GeneXpert MRSA Assay (FDA approved for NASAL specimens only), is one component of a comprehensive MRSA colonization surveillance program. It is not intended to diagnose MRSA  infection nor to guide or monitor treatment for MRSA infections. Performed at [facility], [address] [place], [place] [address]          Radiology Studies: US RENAL  Result Date: 04/05/2020 CLINICAL DATA:  Acute kidney injury EXAM: RENAL / URINARY TRACT ULTRASOUND COMPLETE COMPARISON:  12/12/2019 FINDINGS: Right Kidney: Renal measurements: 10.6 x 4.1 x 5 cm = volume: 112 mL. Cortex is slightly echogenic. No hydronephrosis or mass Left Kidney: Renal measurements: 10.2 x 4.6 x 5.1 cm = volume: 125 mL. Cortex is slightly echogenic. No hydronephrosis or mass. Bladder: Appears normal for degree of bladder distention. Bladder volume of 473 cubic cm at the time of imaging Other: None. IMPRESSION: Echogenic kidneys consistent with medical renal disease. No hydronephrosis. Electronically Signed   By: Donavan Foil M.D.   On: 04/05/2020 19:49   ECHOCARDIOGRAM COMPLETE  Result Date: 04/05/2020    ECHOCARDIOGRAM REPORT   Patient Name:   TALANA SLATTEN Date of Exam: 04/05/2020 Medical Rec #:  [id number]         Height:       69.0 in Accession #:    [id number]        Weight:       153.0 lb Date of Birth:  [birth year]         BSA:          1.844 m Patient Age:    46 years          BP:           145/79 mmHg Patient Gender: F                 HR:           69 bpm. Exam Location:  [facility] Procedure: 2D Echo, Cardiac Doppler and Color Doppler Indications:     Cardiac arrest I46.9  History:        Patient has prior history of Echocardiogram examinations, most                 recent 10/20/2018. CHF, TIA; Risk Factors:Hypertension, Diabetes                 and Dyslipidemia. CKD (chronic kidney disease) stage 3, GFR                 30-59 ml/min.  Sonographer:    Alvino Chapel RCS Referring Phys: [id number] [place]  1. Left ventricular ejection fraction, by estimation, is 65 to 70%. The left ventricle has normal function. The left ventricle has no regional wall motion abnormalities. There is severe left ventricular hypertrophy. Left ventricular diastolic parameters  are consistent with Grade I diastolic dysfunction (impaired relaxation). Elevated left atrial pressure.  2. Right ventricular systolic function is normal. The right ventricular size is normal.  3. Left atrial size was moderately dilated.  4. The pericardial effusion is circumferential.  5. The mitral valve is normal in structure. Trivial mitral valve regurgitation. No evidence of mitral stenosis.  6. The aortic valve is tricuspid. Aortic valve regurgitation is not visualized. No aortic stenosis is present.  7. The inferior vena cava is normal in size with greater than 50% respiratory variability, suggesting right atrial pressure of 3 mmHg. FINDINGS  Left Ventricle: Left ventricular ejection fraction, by estimation, is 65 to 70%. The left ventricle has normal function. The left ventricle has no regional wall motion abnormalities. The left ventricular internal cavity size was normal in size. There is  severe left ventricular hypertrophy. Left ventricular diastolic parameters are consistent with Grade I diastolic dysfunction (impaired relaxation). Elevated left atrial pressure. Right Ventricle: The right ventricular size is normal. No increase in right ventricular wall thickness. Right ventricular systolic function is normal. Left Atrium: Left atrial size was moderately dilated. Right Atrium:  Right atrial size was not well visualized. Pericardium: Trivial pericardial effusion is present. The pericardial effusion is circumferential. Mitral Valve: The mitral valve is normal in structure. Trivial mitral valve regurgitation. No evidence of mitral valve stenosis. Tricuspid Valve: The tricuspid valve is normal in structure. Tricuspid valve regurgitation is not demonstrated. No evidence of tricuspid stenosis. Aortic Valve: The aortic valve is tricuspid. Aortic valve regurgitation is not visualized. No aortic stenosis is present. Aortic valve mean gradient measures 4.1 mmHg. Aortic valve peak gradient measures 7.9 mmHg. Aortic valve area, by VTI measures 2.08 cm. Pulmonic Valve: The pulmonic valve was not well visualized. Pulmonic valve regurgitation is mild. No evidence of pulmonic stenosis. Aorta: The aortic root is normal in size and structure. Pulmonary Artery: Indeterminant PASP, inadequate TR jet. Venous: The inferior vena cava is normal in size with greater than 50% respiratory variability, suggesting right atrial pressure of 3 mmHg. IAS/Shunts: The interatrial septum was not well visualized.  LEFT VENTRICLE PLAX 2D LVIDd:         3.60 cm  Diastology LVIDs:         2.10 cm  LV e' medial:    3.59 cm/s LV PW:         1.70 cm  LV E/e' medial:  17.1 LV IVS:        1.60 cm  LV e' lateral:   5.33 cm/s LVOT diam:     1.90 cm  LV E/e' lateral: 11.5 LV SV:         52 LV SV Index:   28 LVOT Area:     2.84 cm  RIGHT VENTRICLE RV S prime:     13.70 cm/s TAPSE (M-mode): 1.8 cm LEFT ATRIUM             Index       RIGHT ATRIUM           Index LA diam:        3.60 cm 1.95 cm/m  RA Area:     15.50 cm LA Vol (A2C):   55.2 ml 29.94 ml/m RA Volume:   39.00 ml  21.15 ml/m LA Vol (A4C):   66.7 ml 36.18 ml/m LA Biplane Vol: 63.5 ml 34.44 ml/m  AORTIC VALVE AV Area (Vmax):    1.94 cm AV Area (Vmean):   2.01 cm AV Area (VTI):     2.08 cm AV Vmax:           140.62 cm/s AV Vmean:          93.727 cm/s AV VTI:             0.248 m AV Peak Grad:      7.9 mmHg AV Mean Grad:      4.1 mmHg LVOT Vmax:         96.40 cm/s LVOT Vmean:        66.600 cm/s LVOT VTI:          0.182 m LVOT/AV VTI ratio: 0.74  AORTA Ao Root diam: 3.80 cm MITRAL VALVE MV Area (PHT): 1.86 cm    SHUNTS MV Decel Time: 408 msec    Systemic VTI:  0.18 m MV E velocity: 61.40  cm/s  Systemic Diam: 1.90 cm MV A velocity: 85.20 cm/s MV E/A ratio:  0.72 Carlyle Dolly MD Electronically signed by Carlyle Dolly MD Signature Date/Time: 04/05/2020/1:09:17 PM    Final         Scheduled Meds: . aspirin EC  81 mg Oral Daily  . atorvastatin  40 mg Oral QHS  . carvedilol  6.25 mg Oral BID WC  . Chlorhexidine Gluconate Cloth  6 each Topical Daily  . hydrALAZINE  50 mg Oral Q8H  . insulin aspart  0-5 Units Subcutaneous QHS  . insulin aspart  3 Units Subcutaneous TID WC  . insulin glargine  20 Units Subcutaneous Daily  . ketorolac  1 drop Left Eye QID  . linagliptin  5 mg Oral Daily  . ofloxacin  1 drop Left Eye QID   Continuous Infusions:   LOS: 2 days    Time spent: 35 minutes    Barb Merino, MD [facility] Pager [phone]

## 2020-04-07 NOTE — Progress Notes (Signed)
No additional cardiology recs at this time, no evidence her event was a primary cardiac cause. We will sign off inpatient care.   Carlyle Dolly MD

## 2020-04-07 NOTE — Progress Notes (Signed)
Inpatient Diabetes Program Recommendations  AACE/ADA: New Consensus Statement on Inpatient Glycemic Control   Target Ranges:  Prepandial:   less than 140 mg/dL      Peak postprandial:   less than 180 mg/dL (1-2 hours)      Critically ill patients:  140 - 180 mg/dL   Results for Chelsea Meza, Chelsea Meza (MRN [id number]) as of 04/07/2020 08:04  Ref. Range 04/06/2020 07:50 04/06/2020 11:33 04/06/2020 16:24 04/06/2020 20:41 04/07/2020 07:33  Glucose-Capillary Latest Ref Range: 70 - 99 mg/dL 104 (H) 344 (H) 229 (H) 193 (H) 156 (H)   Review of Glycemic Control  Diabetes history: DM2 Outpatient Diabetes medications: Lantus 60 units daily, Januvia 50 mg daily Current orders for Inpatient glycemic control: Lantus 35 units daily,, Novolog 0-5 units QHS, Tradjenta 5 mg daily  Inpatient Diabetes Program Recommendations:    Insulin: Patient received Lantus 20 units on 04/06/20 and fasting glucose 156 mg/dl today. Post prandial consistently elevated.  Please decrease Lantus to 20 units daily, ordering Novolog 0-9 units TID with meals, and consider ordering Novolog 3 units TID with meals for meal coverage if patient eats at least 50% of meals.  Thanks, Barnie Alderman, RN, MSN, CDE Diabetes Coordinator Inpatient Diabetes Program [phone] (Team Pager from 8am to 5pm)

## 2020-04-07 NOTE — Progress Notes (Signed)
Physical Therapy Treatment Patient Details Name: Chelsea Meza MRN: [id number] DOB: December 12, 1935 Today's Date: 04/07/2020    History of Present Illness 84 y.o. female with PMH of HTN, diabetes mellitus type 2, PSVT, CKD stage III, TIA, hyperlipidemia who presents to the emergency department due to elevated blood glucose level and high blood pressure. Since hospitalized, code blue called x2.    PT Comments    Pt friendly and willing to participate with therapy today.  Pt making great improvements with less assistance required with bed mobility and transfer training.  Min cueing for proper hand use to assist with standing from bed.  Increased distance with gait training, use of RW with no LOB or fall.  Does present with decreased cadence and fatigue upon return to room.  EOS pt left in chair with call bell within reach.  RN aware of status.    Follow Up Recommendations  Home health PT;Supervision for mobility/OOB;Supervision - Intermittent     Equipment Recommendations  None recommended by PT    Recommendations for Other Services       Precautions / Restrictions Precautions Precautions: Fall Restrictions Weight Bearing Restrictions: No    Mobility  Bed Mobility Overal bed mobility: Modified Independent Bed Mobility: Supine to Sit     Supine to sit: Supervision Sit to supine: Supervision   General bed mobility comments: increased time, use of bedrail to come to EOB  Transfers Overall transfer level: Modified independent Equipment used: Rolling walker (2 wheeled) Transfers: Sit to/from Stand Sit to Stand: Supervision         General transfer comment: Cueing for hand placement to assist, rocking momentum, min A to steady upon standing  Ambulation/Gait Ambulation/Gait assistance: Min assist;Min guard Gait Distance (Feet): 80 Feet   Gait Pattern/deviations: Step-through pattern;Decreased stride length     General Gait Details: slow cadence, no LOB or  fall   Stairs             Wheelchair Mobility    Modified Rankin (Stroke Patients Only)       Balance                                            Cognition Arousal/Alertness: Awake/alert Behavior During Therapy: WFL for tasks assessed/performed Overall Cognitive Status: Within Functional Limits for tasks assessed                                        Exercises General Exercises - Lower Extremity Ankle Circles/Pumps: Both;10 reps;Seated Long Arc Quad: Both;10 reps;Seated Hip Flexion/Marching: Both;10 reps;Seated    General Comments        Pertinent Vitals/Pain Pain Assessment: No/denies pain    Home Living Family/patient expects to be discharged to:: Private residence Living Arrangements: Children (2 daughters) Available Help at Discharge: Family;Available 24 hours/day Type of Home: Apartment Home Access: Level entry   Home Layout: One level Home Equipment: Walker - 2 wheels;Cane - single point;Bedside commode      Prior Function Level of Independence: Independent      Comments: household and community ambulator with [facility], independent with ADLs, denies falls, daughter provides transportation   PT Goals (current goals can now be found in the care plan section)      Frequency    Min 3X/week  PT Plan Current plan remains appropriate    Co-evaluation              AM-PAC PT "6 Clicks" Mobility   Outcome Measure  Help needed turning from your back to your side while in a flat bed without using bedrails?: None Help needed moving from lying on your back to sitting on the side of a flat bed without using bedrails?: None Help needed moving to and from a bed to a chair (including a wheelchair)?: None Help needed standing up from a chair using your arms (e.g., wheelchair or bedside chair)?: A Little Help needed to walk in hospital room?: A Little Help needed climbing 3-5 steps with a railing? : A Lot 6  Click Score: 20    End of Session Equipment Utilized During Treatment: Gait belt Activity Tolerance: Patient tolerated treatment well Patient left: in chair;with call bell/phone within reach Nurse Communication: Mobility status PT Visit Diagnosis: Unsteadiness on feet (R26.81);Other abnormalities of gait and mobility (R26.89)     Time: 5396-7289 PT Time Calculation (min) (ACUTE ONLY): 18 min  Charges:  $Therapeutic Activity: 8-22 mins                     Ihor Austin, LPTA/CLT; CBIS [phone]   Chelsea Meza 04/07/2020, 10:36 AM

## 2020-04-07 NOTE — TOC Progression Note (Signed)
Transition of Care [facility]) - Progression Note   Patient Details  Name: Chelsea Meza MRN: [id number] Date of Birth: 14-Jan-1936  [facility] CM/SW [place], LCSW Phone Number: 04/07/2020, 12:54 PM  Clinical Narrative: CSW made [facility] referral to [facility] with [facility]. CSW scheduled follow up PCP appointment for 04/12/20 at 10:30am. [facility] and PCP appointment added to AVS. CSW updated patient. CSW attempted to call both patient's daughters, but was unable to reach either or leave a voicemail. TOC to follow.  Expected Discharge Plan: [place] Barriers to Discharge: Continued Medical Work up  Expected Discharge Plan and Services Expected Discharge Plan: [place] In-house Referral: Clinical Social Work Discharge Planning Services: NA Post Acute Care Choice: [place] arrangements for the past 2 months: Single Family Home             DME Arranged: N/A DME Agency: NA HH Arranged: RN, PT [place] Agency: [place] ([place]) Date [place]: 04/07/20 Time [place]: 1220 Representative spoke with at [place]: Vaughan Basta  Readmission Risk Interventions No flowsheet data found.

## 2020-04-07 NOTE — Evaluation (Signed)
Occupational Therapy Evaluation Patient Details Name: Chelsea Meza MRN: [id number] DOB: 1936-02-27 Today's Date: 04/07/2020    History of Present Illness 84 y.o. female with PMH of HTN, diabetes mellitus type 2, PSVT, CKD stage III, TIA, hyperlipidemia who presents to the emergency department due to elevated blood glucose level and high blood pressure. Since hospitalized, code blue called x2.   Clinical Impression   Pt agreeable to OT evaluation this am.  Pt performing ADL tasks at supervision level, functional mobility at supervision/min guard. Pt is close to baseline for ADL completion, recommend 24/7 supervision on discharge, no additional OT services required at this time.    Follow Up Recommendations  No OT follow up;Supervision/Assistance - 24 hour    Equipment Recommendations  None recommended by OT       Precautions / Restrictions Precautions Precautions: Fall Restrictions Weight Bearing Restrictions: No      Mobility Bed Mobility Overal bed mobility: Needs Assistance Bed Mobility: Supine to Sit;Sit to Supine     Supine to sit: Supervision Sit to supine: Supervision   General bed mobility comments: increased time, use of bedrail to come to EOB    Transfers Overall transfer level: Needs assistance Equipment used: Rolling walker (2 wheeled) Transfers: Sit to/from Stand Sit to Stand: Supervision                  ADL either performed or assessed with clinical judgement   ADL Overall ADL's : Needs assistance/impaired     Grooming: Wash/dry hands;Supervision/safety;Standing Grooming Details (indicate cue type and reason): standing at sink for grooming                 Toilet Transfer: Supervision/safety;Ambulation;Regular Toilet;RW   Toileting- Clothing Manipulation and Hygiene: Supervision/safety;Sitting/lateral lean;Sit to/from stand       Functional mobility during ADLs: Min guard;Rolling walker       Vision Baseline  Vision/History: No visual deficits Patient Visual Report: No change from baseline Vision Assessment?: No apparent visual deficits            Pertinent Vitals/Pain Pain Assessment: No/denies pain     Hand Dominance Right   Extremity/Trunk Assessment Upper Extremity Assessment Upper Extremity Assessment: Overall WFL for tasks assessed   Lower Extremity Assessment Lower Extremity Assessment: Defer to PT evaluation   Cervical / Trunk Assessment Cervical / Trunk Assessment: Normal   Communication Communication Communication: No difficulties   Cognition Arousal/Alertness: Awake/alert Behavior During Therapy: WFL for tasks assessed/performed Overall Cognitive Status: Within Functional Limits for tasks assessed                                                Home Living Family/patient expects to be discharged to:: Private residence Living Arrangements: Children (2 daughters) Available Help at Discharge: Family;Available 24 hours/day Type of Home: Apartment Home Access: Level entry     Home Layout: One level     Bathroom Shower/Tub: Teacher, early years/pre: Standard     Home Equipment: Environmental consultant - 2 wheels;Cane - single point;Bedside commode          Prior Functioning/Environment Level of Independence: Independent        Comments: household and community ambulator with SPC, independent with ADLs, denies falls, daughter provides transportation        OT Problem List: Decreased activity tolerance;Impaired balance (sitting and/or standing)  End of Session Equipment Utilized During Treatment: Gait belt;Rolling walker  Activity Tolerance: Patient tolerated treatment well Patient left: in bed;with call bell/phone within reach;with bed alarm set  OT Visit Diagnosis: Muscle weakness (generalized) (M62.81)                Time: 9987-2158 OT Time Calculation (min): 13 min Charges:  OT General Charges $OT Visit: 1 Visit OT  Evaluation $OT Eval Low Complexity: 1 Low   Guadelupe Sabin, OTR/L  [phone] 04/07/2020, 10:17 AM

## 2020-04-07 NOTE — Plan of Care (Signed)

## 2020-04-08 LAB — BASIC METABOLIC PANEL
Anion gap: 8 (ref 5–15)
BUN: 100 mg/dL — ABNORMAL HIGH (ref 8–23)
CO2: 19 mmol/L — ABNORMAL LOW (ref 22–32)
Calcium: 8.7 mg/dL — ABNORMAL LOW (ref 8.9–10.3)
Chloride: 100 mmol/L (ref 98–111)
Creatinine, Ser: 3.92 mg/dL — ABNORMAL HIGH (ref 0.44–1.00)
GFR, Estimated: 11 mL/min — ABNORMAL LOW (ref >60)
Glucose, Bld: 216 mg/dL — ABNORMAL HIGH (ref 70–99)
Potassium: 4.5 mmol/L (ref 3.5–5.1)
Sodium: 127 mmol/L — ABNORMAL LOW (ref 135–145)

## 2020-04-08 LAB — GLUCOSE, CAPILLARY
Glucose-Capillary: 162 mg/dL — ABNORMAL HIGH (ref 70–99)
Glucose-Capillary: 225 mg/dL — ABNORMAL HIGH (ref 70–99)

## 2020-04-08 MED ORDER — INSULIN GLARGINE 100 UNIT/ML ~~LOC~~ SOLN
25.0000 [IU] | Freq: Every day | SUBCUTANEOUS | Status: DC
  Administered 2020-04-08: 25 [IU] via SUBCUTANEOUS
  Filled 2020-04-08 (×2): qty 0.25

## 2020-04-08 MED ORDER — FUROSEMIDE 40 MG PO TABS
40.0000 mg | ORAL_TABLET | Freq: Every day | ORAL | Status: DC
  Administered 2020-04-08: 40 mg via ORAL
  Filled 2020-04-08: qty 1

## 2020-04-08 MED ORDER — INSULIN GLARGINE 100 UNITS/ML SOLOSTAR PEN: 25.0000 [IU] | PEN_INJECTOR | Freq: Every day | SUBCUTANEOUS | 1 refills | Status: DC

## 2020-04-08 MED ORDER — INSULIN ASPART 100 UNIT/ML FLEXPEN: 3.0000 [IU] | PEN_INJECTOR | Freq: Three times a day (TID) | SUBCUTANEOUS | 11 refills | Status: DC

## 2020-04-08 MED FILL — Medication: Qty: 1 | Status: AC

## 2020-04-08 NOTE — Progress Notes (Signed)
Inpatient Diabetes Program Recommendations  AACE/ADA: New Consensus Statement on Inpatient Glycemic Control   Target Ranges:  Prepandial:   less than 140 mg/dL      Peak postprandial:   less than 180 mg/dL (1-2 hours)      Critically ill patients:  140 - 180 mg/dL  Results for Chelsea Meza, Chelsea Meza (MRN [id number]) as of 04/08/2020 08:11  Ref. Range 04/07/2020 07:33 04/07/2020 11:18 04/07/2020 16:19 04/07/2020 20:57 04/08/2020 07:32  Glucose-Capillary Latest Ref Range: 70 - 99 mg/dL 156 (H) 303 (H) 256 (H) 256 (H) 162 (H)    Review of Glycemic Control  Diabetes history:DM2 Outpatient Diabetes medications:Lantus 60 units daily, Januvia 50 mg daily Current orders for Inpatient glycemic control:Lantus 25 units daily, Novolog 0-5 units QHS, Novolog 3 units TID with meals, Tradjenta 5 mg daily   Inpatient Diabetes Program Recommendations:    Insulin: Please consider ordering Novolog 0-9 units TID with meals for correction.  Thanks, Barnie Alderman, RN, MSN, CDE Diabetes Coordinator Inpatient Diabetes Program [phone] (Team Pager from 8am to 5pm)

## 2020-04-08 NOTE — Care Management Important Message (Signed)
Important Message  Patient Details  Name: Chelsea Meza MRN: [id number] Date of Birth: April 04, 1936   Medicare Important Message Given:  Yes (late entry)     Tommy Medal 04/08/2020, 1:59 PM

## 2020-04-08 NOTE — TOC Transition Note (Signed)
Transition of Care [facility]) - CM/SW Discharge Note  Patient Details  Name: Chelsea Meza MRN: [id number] Date of Birth: 1936-02-23  [facility]) CM/SW Contact:  Sherie Don, LCSW Phone Number: 04/08/2020, 10:54 AM  Clinical Narrative: Patient is ready for discharge and is currently staying at the Extended Stay in [place]. Per Vaughan Basta, she can no longer provide [facility] services. CSW placed OP PT orders. Patient updated. CSW attempted to call daughter, [name], but was unable to reach her or leave a voicemail. TOC signing off.  Final next level of care: OP Rehab Barriers to Discharge: Barriers Resolved  Patient Goals and CMS Choice Patient states their goals for this hospitalization and ongoing recovery are:: Return home CMS Medicare.gov Compare Post Acute Care list provided to:: Patient Choice offered to / list presented to : Patient  Discharge Plan and Services In-house Referral: Clinical Social Work Discharge Planning Services: NA Post Acute Care Choice: Home Health          DME Arranged: N/A DME Agency: NA HH Arranged: NA HH Agency: NA Date [place]: 04/08/20 Time [place]: 1220 Representative spoke with at [place]: Vaughan Basta  Readmission Risk Interventions No flowsheet data found.

## 2020-04-08 NOTE — Discharge Summary (Signed)
Physician Discharge Summary  Chelsea Meza GHW:[id number] DOB: 01-24-1936 DOA: 04/04/2020  PCP: Iona Beard, MD  Admit date: 04/04/2020 Discharge date: 04/08/2020  Admitted From: Home  Disposition: Home with home health  Recommendations for Outpatient Follow-up:  1. Follow up with PCP in 1-2 weeks 2. Please obtain BMP/CBC in one week 3. Schedule follow-up with nephrology.  Home Health: Physical therapy Equipment/Devices: Not needed  Discharge Condition: Fair CODE STATUS: Full code Diet recommendation: Low-salt, low-carb diet  Discharge summary: 84 year old female with history of CKD stage IV with baseline creatinine about 1.9-3, type 2 diabetes on insulin, TIA, hypertension presented from home with hyperglycemia and hypertensive crisis as well as renal failure.  Patient had not been taking her medications for at least 2 weeks and had recently started taking it.  Since Thanksgiving, her blood pressure and glucose were up, her legs were swollen, felt sick so came to ER.  At the emergency room, blood glucose more than 800, creatinine more than 5 and blood pressure was 229/107.  She was started on multiple antihypertensive medications insulin drip and fluids.  After administration of several blood pressure medicine at once she lost her pulse and became unresponsive.  She had a CODE BLUE called x2 with a spontaneous recovery.  2nd episode she was started on atropine and epinephrine transiently with a spontaneous improvement.   Assessment & Plan of care:   # PEA arrest in the setting of hypovolemic shock, acute respiratory failure in the setting of renal failure and cardiac arrest: Spontaneously improved.  Initially treated with vasopressors and weaned off.  No evidence of sepsis.  Echocardiogram with normal ejection fraction, normal valves, no evidence of regional wall abnormalities. Followed by cardiology, they recommended no need for further cardiac invasive testing. Discontinued  verapamil due to bradycardia, tolerating carvedilol.  # Diabetes with hyperglycemia, hyperglycemic crisis without DKA in a patient with uncontrolled diabetes: Recently not using insulin at home. Hemoglobin A1c more than 13.  Admitted with blood sugars of more than 800.  Initially treated with insulin drip.  Now variable blood sugars. Blood sugars fairly stabilized.  It will be difficult to achieve tight euglycemia at this time.   We will discharge patient on Lantus 25 units once a day, already has prescriptions at home.   We will add NovoLog 3 units with meals.  Discontinue all oral hypoglycemics.   Patient will more closely monitor her blood sugars, keep a logbook and bring it to doctor's office for follow-up.   # Acute renal failure with history of CKD stage IV: Reported baseline creatinine about 2-4.  She does follow-up with Dr. Theador Hawthorne in the clinic.  Patient has progressive kidney disease.  Renal ultrasound did not show any hydronephrosis.  It showed chronic kidney disease.  Creatinine is 3.39 today.  This may be her new baseline. I called and discussed with Dr. Theador Hawthorne, patient's nephrology.  They will schedule follow-up at the clinic, will need reevaluation within 1 week to ensure stabilization. Will resume Lasix 40 mg today.  # Essential hypertension with hypertensive crisis: Continue Coreg, hydralazine.  She had a bradycardic event so verapamil stopped. Coreg challenged and tolerated. Holding Aldactone for acute renal failure.  Blood sugars are acceptable today.  Patient is medically stabilized.  She is able to go home with her family.  She needs to follow-up with her primary care physician and nephrology.    Discharge Diagnoses:  Principal Problem:   Hyperosmolar hyperglycemic state (HHS) ([place]) Active Problems:   DKA, type  2 ([place])   Acute kidney injury superimposed on CKD ([place])   Essential hypertension, benign   Lactic acidosis   Hyponatremia   HLD (hyperlipidemia)    Hypertensive urgency   Cardiac arrest [facility])    Discharge Instructions  Discharge Instructions    Ambulatory referral to Physical Therapy   Complete by: As directed    Call MD for:  difficulty breathing, headache or visual disturbances   Complete by: As directed    Call MD for:  extreme fatigue   Complete by: As directed    Call MD for:  persistant dizziness or light-headedness   Complete by: As directed    Diet - low sodium heart healthy   Complete by: As directed    Diet Carb Modified   Complete by: As directed    Discharge instructions   Complete by: As directed    Check your blood sugars at home, keep a logbook and bring it to doctor's office on follow-up. Take newly prescribed insulin including 25 units of long-acting insulin and 3 units of short acting insulin with meals as prescribed. Schedule follow-up with your kidney doctor within 1 week, will need repeat blood test. There are some changes on your blood pressure medications, please take new medications.  Some medicines has been stopped.   Increase activity slowly   Complete by: As directed      Allergies as of 04/08/2020   No Known Allergies     Medication List    STOP taking these medications   sitaGLIPtin 50 MG tablet Commonly known as: JANUVIA   spironolactone 25 MG tablet Commonly known as: ALDACTONE   verapamil 240 MG CR tablet Commonly known as: CALAN-SR     TAKE these medications   aspirin EC 81 MG tablet Take 81-325 mg by mouth daily.   atorvastatin 40 MG tablet Commonly known as: LIPITOR Take 40 mg by mouth at bedtime.   carvedilol 6.25 MG tablet Commonly known as: COREG Take 1 tablet (6.25 mg total) by mouth 2 (two) times daily with a meal. What changed:   how much to take  when to take this   furosemide 40 MG tablet Commonly known as: LASIX Take 40 mg by mouth daily.   hydrALAZINE 100 MG tablet Commonly known as: APRESOLINE Take 1 tablet (100 mg total) by mouth 3 (three) times  daily.   insulin aspart 100 UNIT/ML FlexPen Commonly known as: NOVOLOG Inject 3 Units into the skin 3 (three) times daily with meals.   insulin glargine 100 unit/mL Sopn Commonly known as: LANTUS Inject 25 Units into the skin daily. What changed: how much to take   ketorolac 0.5 % ophthalmic solution Commonly known as: ACULAR Place 1 drop into the left eye 4 (four) times daily.   ofloxacin 0.3 % ophthalmic solution Commonly known as: OCUFLOX Place 1 drop into the left eye 4 (four) times daily.   VITAMIN B-12 CR PO Take 1 tablet by mouth daily.   Vitamin D (Ergocalciferol) 1.25 MG (50000 UNIT) Caps capsule Commonly known as: DRISDOL Take 50,000 Units by mouth once a week.       Follow-up Information    Iona Beard, MD Follow up on 04/12/2020.   Specialty: Family Medicine Why: Appointment is at 10:30am. Please if you need to change the time. Contact information: [address] [place] [address] [phone]        Arnoldo Lenis, MD .   Specialty: Cardiology Contact information: [address] [place]  [place] [address] [phone]        Liana Gerold, MD. Schedule an appointment as soon as possible for a visit in 1 week(s).   Specialty: Nephrology Contact information: [address] [address] [phone]              No Known Allergies  Consultations:  Cardiology   Procedures/Studies: US RENAL  Result Date: 04/05/2020 CLINICAL DATA:  Acute kidney injury EXAM: RENAL / URINARY TRACT ULTRASOUND COMPLETE COMPARISON:  12/12/2019 FINDINGS: Right Kidney: Renal measurements: 10.6 x 4.1 x 5 cm = volume: 112 mL. Cortex is slightly echogenic. No hydronephrosis or mass Left Kidney: Renal measurements: 10.2 x 4.6 x 5.1 cm = volume: 125 mL. Cortex is slightly echogenic. No hydronephrosis or mass. Bladder: Appears normal for degree of bladder distention. Bladder volume of 473 cubic cm at the time of imaging Other: None.  IMPRESSION: Echogenic kidneys consistent with medical renal disease. No hydronephrosis. Electronically Signed   By: Donavan Foil M.D.   On: 04/05/2020 19:49   ECHOCARDIOGRAM COMPLETE  Result Date: 04/05/2020    ECHOCARDIOGRAM REPORT   Patient Name:   Chelsea Meza Date of Exam: 04/05/2020 Medical Rec #:  [id number]         Height:       69.0 in Accession #:    [id number]        Weight:       153.0 lb Date of Birth:  1935-11-30         BSA:          1.844 m Patient Age:    11 years          BP:           145/79 mmHg Patient Gender: F                 HR:           69 bpm. Exam Location:  [facility] Procedure: 2D Echo, Cardiac Doppler and Color Doppler Indications:    Cardiac arrest I46.9  History:        Patient has prior history of Echocardiogram examinations, most                 recent 10/20/2018. CHF, TIA; Risk Factors:Hypertension, Diabetes                 and Dyslipidemia. CKD (chronic kidney disease) stage 3, GFR                 30-59 ml/min.  Sonographer:    Alvino Chapel RCS Referring Phys: [id number] [place]  1. Left ventricular ejection fraction, by estimation, is 65 to 70%. The left ventricle has normal function. The left ventricle has no regional wall motion abnormalities. There is severe left ventricular hypertrophy. Left ventricular diastolic parameters  are consistent with Grade I diastolic dysfunction (impaired relaxation). Elevated left atrial pressure.  2. Right ventricular systolic function is normal. The right ventricular size is normal.  3. Left atrial size was moderately dilated.  4. The pericardial effusion is circumferential.  5. The mitral valve is normal in structure. Trivial mitral valve regurgitation. No evidence of mitral stenosis.  6. The aortic valve is tricuspid. Aortic valve regurgitation is not visualized. No aortic stenosis is present.  7. The inferior vena cava is normal in size with greater than 50% respiratory variability, suggesting right atrial pressure  of 3 mmHg. FINDINGS  Left Ventricle: Left ventricular ejection  fraction, by estimation, is 65 to 70%. The left ventricle has normal function. The left ventricle has no regional wall motion abnormalities. The left ventricular internal cavity size was normal in size. There is  severe left ventricular hypertrophy. Left ventricular diastolic parameters are consistent with Grade I diastolic dysfunction (impaired relaxation). Elevated left atrial pressure. Right Ventricle: The right ventricular size is normal. No increase in right ventricular wall thickness. Right ventricular systolic function is normal. Left Atrium: Left atrial size was moderately dilated. Right Atrium: Right atrial size was not well visualized. Pericardium: Trivial pericardial effusion is present. The pericardial effusion is circumferential. Mitral Valve: The mitral valve is normal in structure. Trivial mitral valve regurgitation. No evidence of mitral valve stenosis. Tricuspid Valve: The tricuspid valve is normal in structure. Tricuspid valve regurgitation is not demonstrated. No evidence of tricuspid stenosis. Aortic Valve: The aortic valve is tricuspid. Aortic valve regurgitation is not visualized. No aortic stenosis is present. Aortic valve mean gradient measures 4.1 mmHg. Aortic valve peak gradient measures 7.9 mmHg. Aortic valve area, by VTI measures 2.08 cm. Pulmonic Valve: The pulmonic valve was not well visualized. Pulmonic valve regurgitation is mild. No evidence of pulmonic stenosis. Aorta: The aortic root is normal in size and structure. Pulmonary Artery: Indeterminant PASP, inadequate TR jet. Venous: The inferior vena cava is normal in size with greater than 50% respiratory variability, suggesting right atrial pressure of 3 mmHg. IAS/Shunts: The interatrial septum was not well visualized.  LEFT VENTRICLE PLAX 2D LVIDd:         3.60 cm  Diastology LVIDs:         2.10 cm  LV e' medial:    3.59 cm/s LV PW:         1.70 cm  LV E/e' medial:   17.1 LV IVS:        1.60 cm  LV e' lateral:   5.33 cm/s LVOT diam:     1.90 cm  LV E/e' lateral: 11.5 LV SV:         52 LV SV Index:   28 LVOT Area:     2.84 cm  RIGHT VENTRICLE RV S prime:     13.70 cm/s TAPSE (M-mode): 1.8 cm LEFT ATRIUM             Index       RIGHT ATRIUM           Index LA diam:        3.60 cm 1.95 cm/m  RA Area:     15.50 cm LA Vol (A2C):   55.2 ml 29.94 ml/m RA Volume:   39.00 ml  21.15 ml/m LA Vol (A4C):   66.7 ml 36.18 ml/m LA Biplane Vol: 63.5 ml 34.44 ml/m  AORTIC VALVE AV Area (Vmax):    1.94 cm AV Area (Vmean):   2.01 cm AV Area (VTI):     2.08 cm AV Vmax:           140.62 cm/s AV Vmean:          93.727 cm/s AV VTI:            0.248 m AV Peak Grad:      7.9 mmHg AV Mean Grad:      4.1 mmHg LVOT Vmax:         96.40 cm/s LVOT Vmean:        66.600 cm/s LVOT VTI:          0.182 m LVOT/AV VTI ratio: 0.74  AORTA Ao Root diam: 3.80 cm MITRAL VALVE MV Area (PHT): 1.86 cm    SHUNTS MV Decel Time: 408 msec    Systemic VTI:  0.18 m MV E velocity: 61.40 cm/s  Systemic Diam: 1.90 cm MV A velocity: 85.20 cm/s MV E/A ratio:  0.72 Carlyle Dolly MD Electronically signed by Carlyle Dolly MD Signature Date/Time: 04/05/2020/1:09:17 PM    Final    (Echo, Carotid, EGD, Colonoscopy, ERCP)    Subjective: Patient seen and examined.  No overnight events.  She is ready to go home.   Discharge Exam: Vitals:   04/07/20 2055 04/08/20 0528  BP: (!) 127/91 (!) 149/75  Pulse: 78 69  Resp: 18 17  Temp: 97.7 F (36.5 C) 97.9 F (36.6 C)  SpO2: 99% 99%   Vitals:   04/07/20 0401 04/07/20 1452 04/07/20 2055 04/08/20 0528  BP: (!) 159/74 138/66 (!) 127/91 (!) 149/75  Pulse: 78 75 78 69  Resp: 20  18 17   Temp: 97.9 F (36.6 C) 98.2 F (36.8 C) 97.7 F (36.5 C) 97.9 F (36.6 C)  TempSrc: Oral Oral    SpO2: 100% 100% 99% 99%  Weight:      Height:        General: Pt is alert, awake, not in acute distress, frail looking lady on room air. Cardiovascular: RRR, S1/S2 +, no rubs, no  gallops Respiratory: CTA bilaterally, no wheezing, no rhonchi Abdominal: Soft, NT, ND, bowel sounds + Extremities: Trace bilateral pedal edema.    The results of significant diagnostics from this hospitalization (including imaging, microbiology, ancillary and laboratory) are listed below for reference.     Microbiology: Recent Results (from the past 240 hour(s))  Resp Panel by RT-PCR (Flu A&B, Covid) Nasopharyngeal Swab     Status: None   Collection Time: 04/04/20 10:30 PM   Specimen: Nasopharyngeal Swab; Nasopharyngeal(NP) swabs in vial transport medium  Result Value Ref Range Status   SARS Coronavirus 2 by RT PCR NEGATIVE NEGATIVE Final    Comment: (NOTE) SARS-CoV-2 target nucleic acids are NOT DETECTED.  The SARS-CoV-2 RNA is generally detectable in upper respiratory specimens during the acute phase of infection. The lowest concentration of SARS-CoV-2 viral copies this assay can detect is 138 copies/mL. A negative result does not preclude SARS-Cov-2 infection and should not be used as the sole basis for treatment or other patient management decisions. A negative result may occur with  improper specimen collection/handling, submission of specimen other than nasopharyngeal swab, presence of viral mutation(s) within the areas targeted by this assay, and inadequate number of viral copies(<138 copies/mL). A negative result must be combined with clinical observations, patient history, and epidemiological information. The expected result is Negative.  Fact Sheet for Patients:  EntrepreneurPulse.com.au  Fact Sheet for Healthcare Providers:  IncredibleEmployment.be  This test is no t yet approved or cleared by the [place] FDA and  has been authorized for detection and/or diagnosis of SARS-CoV-2 by FDA under an Emergency Use Authorization (EUA). This EUA will remain  in effect (meaning this test can be used) for the duration of the COVID-19  declaration under Section 564(b)(1) of the Act, 21 U.S.C.section 360bbb-3(b)(1), unless the authorization is terminated  or revoked sooner.       Influenza A by PCR NEGATIVE NEGATIVE Final   Influenza B by PCR NEGATIVE NEGATIVE Final    Comment: (NOTE) The Xpert Xpress SARS-CoV-2/FLU/RSV plus assay is intended as an aid in the diagnosis of influenza from Nasopharyngeal swab specimens and should not be  used as a sole basis for treatment. Nasal washings and aspirates are unacceptable for Xpert Xpress SARS-CoV-2/FLU/RSV testing.  Fact Sheet for Patients: EntrepreneurPulse.com.au  Fact Sheet for Healthcare Providers: IncredibleEmployment.be  This test is not yet approved or cleared by the [place] FDA and has been authorized for detection and/or diagnosis of SARS-CoV-2 by FDA under an Emergency Use Authorization (EUA). This EUA will remain in effect (meaning this test can be used) for the duration of the COVID-19 declaration under Section 564(b)(1) of the Act, 21 U.S.C. section 360bbb-3(b)(1), unless the authorization is terminated or revoked.  Performed at [facility], [address], [place], [place] [address]   MRSA PCR Screening     Status: None   Collection Time: 04/05/20  1:55 AM   Specimen: Nasal Mucosa; Nasopharyngeal  Result Value Ref Range Status   MRSA by PCR NEGATIVE NEGATIVE Final    Comment:        The GeneXpert MRSA Assay (FDA approved for NASAL specimens only), is one component of a comprehensive MRSA colonization surveillance program. It is not intended to diagnose MRSA infection nor to guide or monitor treatment for MRSA infections. Performed at [facility], [address] [place], [place] [address]      Labs: BNP (last 3 results) No results for input(s): BNP in the last 8760 hours. Basic Metabolic Panel: Recent Labs  Lab 04/05/20 0430 04/05/20 1029 04/05/20 1803 04/06/20 1031 04/07/20 0511  04/08/20 0322  NA 127* 130* 133* 130* 128* 127*  K 3.9 4.8 3.9 3.7 4.1 4.5  CL 92* 97* 99 99 100 100  CO2 19* 18* 22 21* 18* 19*  GLUCOSE 277* 155* 71 170* 157* 216*  BUN 117* 114* 100* 96* 97* 100*  CREATININE 4.74* 4.69* 4.07* 3.53* 3.34* 3.92*  CALCIUM 9.2 8.9 9.1 8.9 8.5* 8.7*  MG 2.0  --   --   --   --   --   PHOS 3.7  --   --   --   --   --    Liver Function Tests: Recent Labs  Lab 04/04/20 2230 04/05/20 0430  AST 23 37  ALT 15 22  ALKPHOS 115 103  BILITOT 0.7 0.8  PROT 8.2* 7.3  ALBUMIN 4.3 3.7   No results for input(s): LIPASE, AMYLASE in the last 168 hours. No results for input(s): AMMONIA in the last 168 hours. CBC: Recent Labs  Lab 04/04/20 2230 04/05/20 0430 04/06/20 1031 04/07/20 0511  WBC 8.3 11.8* 8.9 9.2  NEUTROABS 5.0  --   --   --   HGB 12.8 11.7* 11.5* 10.2*  HCT 39.2 36.4 35.7* 31.4*  MCV 86.2 86.9 86.9 87.0  PLT 203 195 183 157   Cardiac Enzymes: No results for input(s): CKTOTAL, CKMB, CKMBINDEX, TROPONINI in the last 168 hours. BNP: Invalid input(s): POCBNP CBG: Recent Labs  Lab 04/07/20 1118 04/07/20 1619 04/07/20 2057 04/08/20 0732 04/08/20 1114  GLUCAP 303* 256* 256* 162* 225*   D-Dimer No results for input(s): DDIMER in the last 72 hours. Hgb A1c No results for input(s): HGBA1C in the last 72 hours. Lipid Profile No results for input(s): CHOL, HDL, LDLCALC, TRIG, CHOLHDL, LDLDIRECT in the last 72 hours. Thyroid function studies No results for input(s): TSH, T4TOTAL, T3FREE, THYROIDAB in the last 72 hours.  Invalid input(s): FREET3 Anemia work up No results for input(s): VITAMINB12, FOLATE, FERRITIN, TIBC, IRON, RETICCTPCT in the last 72 hours. Urinalysis    Component Value Date/Time   COLORURINE YELLOW 04/06/2020 0550  APPEARANCEUR HAZY (A) 04/06/2020 0550   LABSPEC 1.012 04/06/2020 0550   PHURINE 5.0 04/06/2020 0550   GLUCOSEU 50 (A) 04/06/2020 0550   HGBUR NEGATIVE 04/06/2020 0550   BILIRUBINUR NEGATIVE  04/06/2020 0550   KETONESUR NEGATIVE 04/06/2020 0550   PROTEINUR 30 (A) 04/06/2020 0550   UROBILINOGEN 0.2 07/13/2012 1236   NITRITE NEGATIVE 04/06/2020 0550   LEUKOCYTESUR MODERATE (A) 04/06/2020 0550   Sepsis Labs Invalid input(s): PROCALCITONIN,  WBC,  LACTICIDVEN Microbiology Recent Results (from the past 240 hour(s))  Resp Panel by RT-PCR (Flu A&B, Covid) Nasopharyngeal Swab     Status: None   Collection Time: 04/04/20 10:30 PM   Specimen: Nasopharyngeal Swab; Nasopharyngeal(NP) swabs in vial transport medium  Result Value Ref Range Status   SARS Coronavirus 2 by RT PCR NEGATIVE NEGATIVE Final    Comment: (NOTE) SARS-CoV-2 target nucleic acids are NOT DETECTED.  The SARS-CoV-2 RNA is generally detectable in upper respiratory specimens during the acute phase of infection. The lowest concentration of SARS-CoV-2 viral copies this assay can detect is 138 copies/mL. A negative result does not preclude SARS-Cov-2 infection and should not be used as the sole basis for treatment or other patient management decisions. A negative result may occur with  improper specimen collection/handling, submission of specimen other than nasopharyngeal swab, presence of viral mutation(s) within the areas targeted by this assay, and inadequate number of viral copies(<138 copies/mL). A negative result must be combined with clinical observations, patient history, and epidemiological information. The expected result is Negative.  Fact Sheet for Patients:  EntrepreneurPulse.com.au  Fact Sheet for Healthcare Providers:  IncredibleEmployment.be  This test is no t yet approved or cleared by the [place] FDA and  has been authorized for detection and/or diagnosis of SARS-CoV-2 by FDA under an Emergency Use Authorization (EUA). This EUA will remain  in effect (meaning this test can be used) for the duration of the COVID-19 declaration under Section 564(b)(1) of  the Act, 21 U.S.C.section 360bbb-3(b)(1), unless the authorization is terminated  or revoked sooner.       Influenza A by PCR NEGATIVE NEGATIVE Final   Influenza B by PCR NEGATIVE NEGATIVE Final    Comment: (NOTE) The Xpert Xpress SARS-CoV-2/FLU/RSV plus assay is intended as an aid in the diagnosis of influenza from Nasopharyngeal swab specimens and should not be used as a sole basis for treatment. Nasal washings and aspirates are unacceptable for Xpert Xpress SARS-CoV-2/FLU/RSV testing.  Fact Sheet for Patients: EntrepreneurPulse.com.au  Fact Sheet for Healthcare Providers: IncredibleEmployment.be  This test is not yet approved or cleared by the [place] FDA and has been authorized for detection and/or diagnosis of SARS-CoV-2 by FDA under an Emergency Use Authorization (EUA). This EUA will remain in effect (meaning this test can be used) for the duration of the COVID-19 declaration under Section 564(b)(1) of the Act, 21 U.S.C. section 360bbb-3(b)(1), unless the authorization is terminated or revoked.  Performed at [facility], [address], [place], [place] [address]   MRSA PCR Screening     Status: None   Collection Time: 04/05/20  1:55 AM   Specimen: Nasal Mucosa; Nasopharyngeal  Result Value Ref Range Status   MRSA by PCR NEGATIVE NEGATIVE Final    Comment:        The GeneXpert MRSA Assay (FDA approved for NASAL specimens only), is one component of a comprehensive MRSA colonization surveillance program. It is not intended to diagnose MRSA infection nor to guide or monitor treatment for MRSA infections. Performed at [facility], [address], [place], [place] [address]      Time coordinating discharge: 45 minutes  SIGNED:   Barb Merino, MD  [facility] 04/08/2020, 11:31 AM

## 2020-04-29 DIAGNOSIS — E1165 Type 2 diabetes mellitus with hyperglycemia: Secondary | ICD-10-CM | POA: Diagnosis not present

## 2020-04-29 DIAGNOSIS — N184 Chronic kidney disease, stage 4 (severe): Secondary | ICD-10-CM | POA: Diagnosis not present

## 2020-04-29 DIAGNOSIS — I10 Essential (primary) hypertension: Secondary | ICD-10-CM | POA: Diagnosis not present

## 2020-04-29 DIAGNOSIS — E1122 Type 2 diabetes mellitus with diabetic chronic kidney disease: Secondary | ICD-10-CM | POA: Diagnosis not present

## 2020-06-19 ENCOUNTER — Emergency Department (HOSPITAL_COMMUNITY)
Admission: EM | Admit: 2020-06-19 | Discharge: 2020-06-19 | Disposition: A | Discharge status: Home / Self Care | Payer: Medicare Other | Source: Home / Self Care | Attending: Emergency Medicine | Admitting: Emergency Medicine

## 2020-06-19 ENCOUNTER — Other Ambulatory Visit: Payer: Self-pay

## 2020-06-19 DIAGNOSIS — R680 Hypothermia, not associated with low environmental temperature: Secondary | ICD-10-CM | POA: Diagnosis not present

## 2020-06-19 DIAGNOSIS — E1122 Type 2 diabetes mellitus with diabetic chronic kidney disease: Secondary | ICD-10-CM | POA: Insufficient documentation

## 2020-06-19 DIAGNOSIS — I10 Essential (primary) hypertension: Secondary | ICD-10-CM

## 2020-06-19 DIAGNOSIS — Z79899 Other long term (current) drug therapy: Secondary | ICD-10-CM | POA: Diagnosis not present

## 2020-06-19 DIAGNOSIS — Z823 Family history of stroke: Secondary | ICD-10-CM | POA: Diagnosis not present

## 2020-06-19 DIAGNOSIS — Z8673 Personal history of transient ischemic attack (TIA), and cerebral infarction without residual deficits: Secondary | ICD-10-CM | POA: Diagnosis not present

## 2020-06-19 DIAGNOSIS — N183 Chronic kidney disease, stage 3 unspecified: Secondary | ICD-10-CM | POA: Insufficient documentation

## 2020-06-19 DIAGNOSIS — Z6821 Body mass index (BMI) 21.0-21.9, adult: Secondary | ICD-10-CM | POA: Diagnosis not present

## 2020-06-19 DIAGNOSIS — I499 Cardiac arrhythmia, unspecified: Secondary | ICD-10-CM | POA: Diagnosis not present

## 2020-06-19 DIAGNOSIS — Z7982 Long term (current) use of aspirin: Secondary | ICD-10-CM | POA: Insufficient documentation

## 2020-06-19 DIAGNOSIS — I130 Hypertensive heart and chronic kidney disease with heart failure and stage 1 through stage 4 chronic kidney disease, or unspecified chronic kidney disease: Secondary | ICD-10-CM | POA: Insufficient documentation

## 2020-06-19 DIAGNOSIS — E162 Hypoglycemia, unspecified: Secondary | ICD-10-CM | POA: Diagnosis not present

## 2020-06-19 DIAGNOSIS — E872 Acidosis: Secondary | ICD-10-CM | POA: Insufficient documentation

## 2020-06-19 DIAGNOSIS — I129 Hypertensive chronic kidney disease with stage 1 through stage 4 chronic kidney disease, or unspecified chronic kidney disease: Secondary | ICD-10-CM | POA: Diagnosis not present

## 2020-06-19 DIAGNOSIS — E1165 Type 2 diabetes mellitus with hyperglycemia: Secondary | ICD-10-CM | POA: Diagnosis not present

## 2020-06-19 DIAGNOSIS — I4891 Unspecified atrial fibrillation: Secondary | ICD-10-CM | POA: Diagnosis not present

## 2020-06-19 DIAGNOSIS — I509 Heart failure, unspecified: Secondary | ICD-10-CM | POA: Insufficient documentation

## 2020-06-19 DIAGNOSIS — E785 Hyperlipidemia, unspecified: Secondary | ICD-10-CM | POA: Diagnosis not present

## 2020-06-19 DIAGNOSIS — Z794 Long term (current) use of insulin: Secondary | ICD-10-CM | POA: Insufficient documentation

## 2020-06-19 DIAGNOSIS — Z743 Need for continuous supervision: Secondary | ICD-10-CM | POA: Diagnosis not present

## 2020-06-19 DIAGNOSIS — G9341 Metabolic encephalopathy: Secondary | ICD-10-CM | POA: Diagnosis not present

## 2020-06-19 DIAGNOSIS — E440 Moderate protein-calorie malnutrition: Secondary | ICD-10-CM | POA: Diagnosis not present

## 2020-06-19 DIAGNOSIS — R739 Hyperglycemia, unspecified: Secondary | ICD-10-CM

## 2020-06-19 DIAGNOSIS — R6889 Other general symptoms and signs: Secondary | ICD-10-CM | POA: Diagnosis not present

## 2020-06-19 DIAGNOSIS — T68XXXA Hypothermia, initial encounter: Secondary | ICD-10-CM | POA: Diagnosis not present

## 2020-06-19 DIAGNOSIS — R001 Bradycardia, unspecified: Secondary | ICD-10-CM | POA: Diagnosis not present

## 2020-06-19 DIAGNOSIS — E11649 Type 2 diabetes mellitus with hypoglycemia without coma: Secondary | ICD-10-CM | POA: Diagnosis not present

## 2020-06-19 DIAGNOSIS — N184 Chronic kidney disease, stage 4 (severe): Secondary | ICD-10-CM | POA: Diagnosis not present

## 2020-06-19 DIAGNOSIS — U071 COVID-19: Secondary | ICD-10-CM | POA: Diagnosis not present

## 2020-06-19 LAB — CBC
HCT: 40 % (ref 36.0–46.0)
Hemoglobin: 12.6 g/dL (ref 12.0–15.0)
MCH: 27.8 pg (ref 26.0–34.0)
MCHC: 31.5 g/dL (ref 30.0–36.0)
MCV: 88.1 fL (ref 80.0–100.0)
Platelets: 161 10*3/uL (ref 150–400)
RBC: 4.54 MIL/uL (ref 3.87–5.11)
RDW: 12.8 % (ref 11.5–15.5)
WBC: 7.9 10*3/uL (ref 4.0–10.5)
nRBC: 0 % (ref 0.0–0.2)

## 2020-06-19 LAB — URINALYSIS, ROUTINE W REFLEX MICROSCOPIC
Bilirubin Urine: NEGATIVE
Glucose, UA: >=500 mg/dL — AB
Ketones, ur: 5 mg/dL — AB
Nitrite: NEGATIVE
Protein, ur: 100 mg/dL — AB
Specific Gravity, Urine: 1.018 (ref 1.005–1.030)
pH: 5 (ref 5.0–8.0)

## 2020-06-19 LAB — BASIC METABOLIC PANEL
Anion gap: 17 — ABNORMAL HIGH (ref 5–15)
Anion gap: 11 (ref 5–15)
BUN: 59 mg/dL — ABNORMAL HIGH (ref 8–23)
BUN: 49 mg/dL — ABNORMAL HIGH (ref 8–23)
CO2: 21 mmol/L — ABNORMAL LOW (ref 22–32)
CO2: 19 mmol/L — ABNORMAL LOW (ref 22–32)
Calcium: 9.3 mg/dL (ref 8.9–10.3)
Calcium: 7.3 mg/dL — ABNORMAL LOW (ref 8.9–10.3)
Chloride: 102 mmol/L (ref 98–111)
Chloride: 115 mmol/L — ABNORMAL HIGH (ref 98–111)
Creatinine, Ser: 2.99 mg/dL — ABNORMAL HIGH (ref 0.44–1.00)
Creatinine, Ser: 2.33 mg/dL — ABNORMAL HIGH (ref 0.44–1.00)
GFR, Estimated: 15 mL/min — ABNORMAL LOW (ref >60)
GFR, Estimated: 20 mL/min — ABNORMAL LOW (ref >60)
Glucose, Bld: 507 mg/dL (ref 70–99)
Glucose, Bld: 387 mg/dL — ABNORMAL HIGH (ref 70–99)
Potassium: 4.5 mmol/L (ref 3.5–5.1)
Potassium: 3.4 mmol/L — ABNORMAL LOW (ref 3.5–5.1)
Sodium: 140 mmol/L (ref 135–145)
Sodium: 145 mmol/L (ref 135–145)

## 2020-06-19 LAB — BETA-HYDROXYBUTYRIC ACID: Beta-Hydroxybutyric Acid: 2.94 mmol/L — ABNORMAL HIGH (ref 0.05–0.27)

## 2020-06-19 LAB — CBG MONITORING, ED
Glucose-Capillary: 457 mg/dL — ABNORMAL HIGH (ref 70–99)
Glucose-Capillary: 444 mg/dL — ABNORMAL HIGH (ref 70–99)
Glucose-Capillary: 334 mg/dL — ABNORMAL HIGH (ref 70–99)

## 2020-06-19 MED ORDER — CARVEDILOL 3.125 MG PO TABS
12.5000 mg | ORAL_TABLET | Freq: Once | ORAL | Status: AC
  Administered 2020-06-19: 12.5 mg via ORAL
  Filled 2020-06-19: qty 4

## 2020-06-19 MED ORDER — INSULIN ASPART 100 UNIT/ML ~~LOC~~ SOLN
7.0000 [IU] | Freq: Once | SUBCUTANEOUS | Status: AC
  Administered 2020-06-19: 7 [IU] via INTRAVENOUS

## 2020-06-19 MED ORDER — INSULIN ASPART 100 UNIT/ML ~~LOC~~ SOLN
12.0000 [IU] | Freq: Once | SUBCUTANEOUS | Status: AC
  Administered 2020-06-19: 12 [IU] via SUBCUTANEOUS

## 2020-06-19 MED ORDER — HYDRALAZINE HCL 25 MG PO TABS
50.0000 mg | ORAL_TABLET | Freq: Once | ORAL | Status: AC
  Administered 2020-06-19: 50 mg via ORAL
  Filled 2020-06-19: qty 2

## 2020-06-19 MED ORDER — SODIUM CHLORIDE 0.9 % IV BOLUS
1000.0000 mL | Freq: Once | INTRAVENOUS | Status: AC
  Administered 2020-06-19: 1000 mL via INTRAVENOUS

## 2020-06-19 NOTE — ED Triage Notes (Signed)
EMS stated, has not took her medicine for over a week. CBG 589. 20g in left AC , 500 cc NS

## 2020-06-19 NOTE — Discharge Instructions (Addendum)
Continue your home medications.  Return to the ER if you have fevers, pain or any other concerns.  Check your blood sugars daily.  Return if they continue to be greater than 200 despite returning to your regular medications.

## 2020-06-19 NOTE — ED Notes (Signed)
Attempted to update pt daughter. VM is full.

## 2020-06-19 NOTE — ED Provider Notes (Addendum)
[facility] EMERGENCY DEPARTMENT Provider Note   CSN: [id number] Arrival date & time: 06/19/20  0847     History No chief complaint on file.   Chelsea Meza is a 85 y.o. female.  Patient presents with high blood pressure and high blood sugar.  She states she otherwise "feels fine."  She states that her daughter urged her to check her blood sugar as she has not been taking her medicines for the past week and so that was high in made her come to the ER.  Patient states that she just stopped taking the medications because she was tired of taking them.  She does have medicines at home.  She denies fevers or cough or vomiting or diarrhea no headache no chest pain no abdominal pain.        Past Medical History:  Diagnosis Date  . Anemia   . CKD (chronic kidney disease) stage 3, GFR 30-59 ml/min ([name])   . Diabetes mellitus without complication ([place])   . Hyperlipidemia   . Hypertension   . TIA (transient ischemic attack)     Patient Active Problem List   Diagnosis Date Noted  . Hyperosmolar hyperglycemic state (HHS) ([place]) 04/05/2020  . Hypertensive urgency 04/05/2020  . Cardiac arrest ([place]) 04/05/2020  . Acute CHF (congestive heart failure) ([place]) 10/18/2018  . Hyperosmolar non-ketotic state in patient with type 2 diabetes mellitus ([place]) 10/18/2018  . TIA (transient ischemic attack) 11/05/2017  . Hypokalemia 11/05/2017  . Hyponatremia 03/20/2015  . AKI (acute kidney injury) ([place]) 03/20/2015  . CKD (chronic kidney disease) stage 3, GFR 30-59 ml/min ([name]) 03/20/2015  . Hyperglycemia 03/20/2015  . HLD (hyperlipidemia) 03/20/2015  . Diabetes mellitus due to underlying condition with diabetic nephropathy ([place]) 03/20/2015  . Diabetic hyperosmolar non-ketotic state ([place]) 07/14/2012  . Lactic acidosis 07/14/2012  . DKA, type 2 ([place]) 07/13/2012  . Acute kidney injury superimposed on CKD ([place]) 07/13/2012  . PSVT (paroxysmal supraventricular tachycardia) ([place])  07/13/2012  . Weakness of both legs 07/13/2012  . Paresthesia of left leg 07/13/2012  . Cough 07/13/2012  . Essential hypertension, benign 07/13/2012    Past Surgical History:  Procedure Laterality Date  . CHOLECYSTECTOMY       OB History   No obstetric history on file.     Family History  Problem Relation Age of Onset  . Stroke Mother   . Stroke Maternal Grandmother     Social History   Tobacco Use  . Smoking status: Never Smoker  . Smokeless tobacco: Never Used  Substance Use Topics  . Alcohol use: No  . Drug use: No    Home Medications Prior to Admission medications   Medication Sig Start Date End Date Taking? Authorizing Provider  aspirin EC 81 MG tablet Take 81 mg by mouth daily.   Yes [provider]  atorvastatin (LIPITOR) 40 MG tablet Take 40 mg by mouth at bedtime. 04/02/20  Yes [provider]  carvedilol (COREG) 12.5 MG tablet Take 12.5 mg by mouth daily. 05/13/20  Yes [provider]  Cyanocobalamin (VITAMIN B-12 CR PO) Take 1 tablet by mouth daily.   Yes [provider]  furosemide (LASIX) 40 MG tablet Take 40 mg by mouth daily.  03/10/20  Yes [provider]  hydrALAZINE (APRESOLINE) 100 MG tablet Take 1 tablet (100 mg total) by mouth 3 (three) times daily. 10/22/18  Yes Tat, Shanon Brow, MD  insulin glargine (LANTUS) 100 unit/mL SOPN Inject 25 Units into the skin daily.  04/08/20  Yes Ghimire, Dante Gang, MD  insulin lispro (HUMALOG) 100 UNIT/ML KwikPen Inject 3 Units into the skin 3 (three) times daily with meals. 04/08/20  Yes [provider]  ketorolac (ACULAR) 0.5 % ophthalmic solution Place 1 drop into the left eye 4 (four) times daily.  09/17/18  Yes [provider]  ofloxacin (OCUFLOX) 0.3 % ophthalmic solution Place 1 drop into the left eye 4 (four) times daily. 09/16/18  Yes [provider]  Vitamin D, Ergocalciferol, (DRISDOL) 1.25 MG (50000 UNIT) CAPS capsule Take 50,000 Units by mouth once a  week. 01/08/20  Yes [provider]  carvedilol (COREG) 6.25 MG tablet Take 1 tablet (6.25 mg total) by mouth 2 (two) times daily with a meal. Patient not taking: Reported on 06/19/2020 10/22/18   Tat, Shanon Brow, MD  insulin aspart (NOVOLOG) 100 UNIT/ML FlexPen Inject 3 Units into the skin 3 (three) times daily with meals. Patient not taking: No sig reported 04/08/20   Barb Merino, MD    Allergies    Patient has no known allergies.  Review of Systems   Review of Systems  Constitutional: Negative for fever.  HENT: Negative for ear pain.   Eyes: Negative for pain.  Respiratory: Negative for cough.   Cardiovascular: Negative for chest pain.  Gastrointestinal: Negative for abdominal pain.  Genitourinary: Negative for flank pain.  Musculoskeletal: Negative for back pain.  Skin: Negative for rash.  Neurological: Negative for headaches.    Physical Exam Updated Vital Signs BP (!) 164/89   Pulse (!) 51   Temp 98.9 F (37.2 C)   Resp 18   SpO2 97%   Physical Exam  ED Results / Procedures / Treatments   Labs (all labs ordered are listed, but only abnormal results are displayed) Labs Reviewed  BASIC METABOLIC PANEL - Abnormal; Notable for the following components:      Result Value   CO2 21 (*)    Glucose, Bld 507 (*)    BUN 59 (*)    Creatinine, Ser 2.99 (*)    GFR, Estimated 15 (*)    Anion gap 17 (*)    All other components within normal limits  URINALYSIS, ROUTINE W REFLEX MICROSCOPIC - Abnormal; Notable for the following components:   APPearance CLOUDY (*)    Glucose, UA >=500 (*)    Hgb urine dipstick SMALL (*)    Ketones, ur 5 (*)    Protein, ur 100 (*)    Leukocytes,Ua LARGE (*)    Bacteria, UA RARE (*)    All other components within normal limits  BETA-HYDROXYBUTYRIC ACID - Abnormal; Notable for the following components:   Beta-Hydroxybutyric Acid 2.94 (*)    All other components within normal limits  BASIC METABOLIC PANEL - Abnormal; Notable for the  following components:   Potassium 3.4 (*)    Chloride 115 (*)    CO2 19 (*)    Glucose, Bld 387 (*)    BUN 49 (*)    Creatinine, Ser 2.33 (*)    Calcium 7.3 (*)    GFR, Estimated 20 (*)    All other components within normal limits  CBG MONITORING, ED - Abnormal; Notable for the following components:   Glucose-Capillary 457 (*)    All other components within normal limits  CBG MONITORING, ED - Abnormal; Notable for the following components:   Glucose-Capillary 444 (*)    All other components within normal limits  CBG MONITORING, ED - Abnormal; Notable for the following components:  Glucose-Capillary 334 (*)    All other components within normal limits  CBC    EKG None  Radiology No results found.  Procedures .Critical Care Performed by: Luna Fuse, MD Authorized by: Luna Fuse, MD   Critical care provider statement:    Critical care time (minutes):  30   Critical care time was exclusive of:  Separately billable procedures and treating other patients   Critical care was necessary to treat or prevent imminent or life-threatening deterioration of the following conditions:  Metabolic crisis and circulatory failure Comments:     Severe hyperglycemia requiring IV insulin, severe hypertension requiring multiple blood pressure medications and reassessments.     Medications Ordered in ED Medications  insulin aspart (novoLOG) injection 12 Units (has no administration in time range)  carvedilol (COREG) tablet 12.5 mg (12.5 mg Oral Given 06/19/20 1110)  sodium chloride 0.9 % bolus 1,000 mL (0 mLs Intravenous Stopped 06/19/20 1409)  insulin aspart (novoLOG) injection 7 Units (7 Units Intravenous Given 06/19/20 1109)  insulin aspart (novoLOG) injection 12 Units (12 Units Subcutaneous Given 06/19/20 1258)  hydrALAZINE (APRESOLINE) tablet 50 mg (50 mg Oral Given 06/19/20 1412)    ED Course  I have reviewed the triage vital signs and the nursing notes.  Pertinent labs &  imaging results that were available during my care of the patient were reviewed by me and considered in my medical decision making (see chart for details).    MDM Rules/Calculators/A&P                          Patient blood pressure in the 032Z systolic.  Given carvedilol with improvement of blood pressure but again starting to rise back.  Labs show blood glucose of greater than 500.  Patient given IV insulin and subsequent doses of subcu insulin with improvement of blood sugar.  Anion gap is just mildly elevated 17 bicarb 21.  Patient given IV fluid hydration and repeat metabolic panel appears improved.  Given a prescription of blood pressure medication, patient advised to follow-up with her doctor within 3 to 4 days.  Advised her to take her medications as written.  Advised immediate return if she has fevers vomiting pain discomfort trouble breathing or any additional concerns.  Also consulted the diabetes coordinator to help ensure this patient would get diabetes outpatient follow-up.  I tried to call the patient's listed family members her daughter's phone numbers, however both numbers are not active apparently.  Patient states that she has no complaints at this time and prefers to go home and be discharged home.   Final Clinical Impression(s) / ED Diagnoses Final diagnoses:  Hyperglycemia  Hypertension, unspecified type    Rx / DC Orders ED Discharge Orders    None       Luna Fuse, MD 06/19/20 1442    Luna Fuse, MD 06/19/20 1535

## 2020-06-19 NOTE — ED Notes (Signed)
Spoke to daughter who is to come pick pt up

## 2020-06-19 NOTE — ED Notes (Signed)
Discharge instructions discussed with pt. Pt verbalized understanding. Pt stable and ambulatory. No signature pad available. 

## 2020-06-19 NOTE — Progress Notes (Signed)
Inpatient Diabetes Program Recommendations  AACE/ADA: New Consensus Statement on Inpatient Glycemic Control (2015)  Target Ranges:  Prepandial:   less than 140 mg/dL      Peak postprandial:   less than 180 mg/dL (1-2 hours)      Critically ill patients:  140 - 180 mg/dL   Lab Results  Component Value Date   GLUCAP 334 (H) 06/19/2020   HGBA1C 13.6 (H) 04/05/2020    Review of Glycemic Control Results for MORRIGAN, WICKENS (MRN [id number]) as of 06/19/2020 15:43  Ref. Range 06/19/2020 08:55  Glucose Latest Ref Range: 70 - 99 mg/dL [facility])  Results for JAIMARIE, RAPOZO (MRN [id number]) as of 06/19/2020 15:43  Ref. Range 06/19/2020 08:55  Anion gap Latest Ref Range: 5 - 15  17 (H)  Results for RAEA, MAGALLON (MRN [id number]) as of 06/19/2020 15:43  Ref. Range 06/19/2020 08:55  CO2 Latest Ref Range: 22 - 32 mmol/L 21 (L)  Results for SUPRINA, MANDEVILLE (MRN [id number]) as of 06/19/2020 15:43  Ref. Range 06/19/2020 08:55  Beta-Hydroxybutyric Acid Latest Ref Range: 0.05 - 0.27 mmol/L 2.94 (H)   Diabetes history: DM2  Outpatient Diabetes medications: Lantus 25 units daily, Humalog 3 units TID  Current orders for Inpatient glycemic control: Received a total of 19 units from 1100-1300  Received page from Dr. Almyra Free.  Will be discharging from ED and wanted OP follow up for DM education.  Placed order for OP DM education.  Spoke with patient on the phone as this DM coordinator is working remotely.  She states she got confused and was not taking her insulin at home.  She lives with her daughter.  Reviewed patient's current A1c of 13.6% on 04/05/20.  Explained what a A1c is and what it measures. Also reviewed goal A1c with patient, importance of good glucose control @ home, and blood sugar goals.  HS estates she has plenty of insulin at home.  Asked her to check her blood sugar 3-4 times a day.  Explained someone from OP DM will be calling her for further education.    Will continue to follow while  inpatient.  Thank you, Reche Dixon, RN, BSN Diabetes Coordinator Inpatient Diabetes Program [phone] (team pager from 8a-5p)

## 2020-06-20 ENCOUNTER — Encounter (HOSPITAL_COMMUNITY): Payer: Self-pay | Admitting: Emergency Medicine

## 2020-06-20 ENCOUNTER — Emergency Department (HOSPITAL_COMMUNITY)
Admission: EM | Admit: 2020-06-20 | Discharge: 2020-06-21 | Disposition: A | Discharge status: Home / Self Care | Payer: Medicare Other | Source: Home / Self Care | Attending: Emergency Medicine | Admitting: Emergency Medicine

## 2020-06-20 DIAGNOSIS — R739 Hyperglycemia, unspecified: Secondary | ICD-10-CM

## 2020-06-20 DIAGNOSIS — I130 Hypertensive heart and chronic kidney disease with heart failure and stage 1 through stage 4 chronic kidney disease, or unspecified chronic kidney disease: Secondary | ICD-10-CM | POA: Insufficient documentation

## 2020-06-20 DIAGNOSIS — I509 Heart failure, unspecified: Secondary | ICD-10-CM | POA: Insufficient documentation

## 2020-06-20 DIAGNOSIS — N183 Chronic kidney disease, stage 3 unspecified: Secondary | ICD-10-CM | POA: Insufficient documentation

## 2020-06-20 DIAGNOSIS — Z7982 Long term (current) use of aspirin: Secondary | ICD-10-CM | POA: Insufficient documentation

## 2020-06-20 DIAGNOSIS — Z794 Long term (current) use of insulin: Secondary | ICD-10-CM | POA: Insufficient documentation

## 2020-06-20 DIAGNOSIS — E1122 Type 2 diabetes mellitus with diabetic chronic kidney disease: Secondary | ICD-10-CM | POA: Insufficient documentation

## 2020-06-20 DIAGNOSIS — E1165 Type 2 diabetes mellitus with hyperglycemia: Secondary | ICD-10-CM | POA: Insufficient documentation

## 2020-06-20 DIAGNOSIS — Z79899 Other long term (current) drug therapy: Secondary | ICD-10-CM | POA: Insufficient documentation

## 2020-06-20 LAB — COMPREHENSIVE METABOLIC PANEL
ALT: 10 U/L (ref 0–44)
AST: 21 U/L (ref 15–41)
Albumin: 2.8 g/dL — ABNORMAL LOW (ref 3.5–5.0)
Alkaline Phosphatase: 72 U/L (ref 38–126)
Anion gap: 11 (ref 5–15)
BUN: 72 mg/dL — ABNORMAL HIGH (ref 8–23)
CO2: 22 mmol/L (ref 22–32)
Calcium: 8.8 mg/dL — ABNORMAL LOW (ref 8.9–10.3)
Chloride: 100 mmol/L (ref 98–111)
Creatinine, Ser: 3.17 mg/dL — ABNORMAL HIGH (ref 0.44–1.00)
GFR, Estimated: 14 mL/min — ABNORMAL LOW (ref >60)
Glucose, Bld: 653 mg/dL (ref 70–99)
Potassium: 4.3 mmol/L (ref 3.5–5.1)
Sodium: 133 mmol/L — ABNORMAL LOW (ref 135–145)
Total Bilirubin: 0.7 mg/dL (ref 0.3–1.2)
Total Protein: 6.3 g/dL — ABNORMAL LOW (ref 6.5–8.1)

## 2020-06-20 LAB — CBC WITH DIFFERENTIAL/PLATELET
Abs Immature Granulocytes: 0.1 10*3/uL — ABNORMAL HIGH (ref 0.00–0.07)
Basophils Absolute: 0 10*3/uL (ref 0.0–0.1)
Basophils Relative: 0 %
Eosinophils Absolute: 0 10*3/uL (ref 0.0–0.5)
Eosinophils Relative: 0 %
HCT: 39 % (ref 36.0–46.0)
Hemoglobin: 11.8 g/dL — ABNORMAL LOW (ref 12.0–15.0)
Immature Granulocytes: 1 %
Lymphocytes Relative: 25 %
Lymphs Abs: 2.4 10*3/uL (ref 0.7–4.0)
MCH: 27.2 pg (ref 26.0–34.0)
MCHC: 30.3 g/dL (ref 30.0–36.0)
MCV: 89.9 fL (ref 80.0–100.0)
Monocytes Absolute: 0.5 10*3/uL (ref 0.1–1.0)
Monocytes Relative: 5 %
Neutro Abs: 6.6 10*3/uL (ref 1.7–7.7)
Neutrophils Relative %: 69 %
Platelets: 158 10*3/uL (ref 150–400)
RBC: 4.34 MIL/uL (ref 3.87–5.11)
RDW: 12.6 % (ref 11.5–15.5)
WBC: 9.7 10*3/uL (ref 4.0–10.5)
nRBC: 0 % (ref 0.0–0.2)

## 2020-06-20 LAB — I-STAT VENOUS BLOOD GAS, ED
Acid-base deficit: 2 mmol/L (ref 0.0–2.0)
Bicarbonate: 23.8 mmol/L (ref 20.0–28.0)
Calcium, Ion: 1.18 mmol/L (ref 1.15–1.40)
HCT: 37 % (ref 36.0–46.0)
Hemoglobin: 12.6 g/dL (ref 12.0–15.0)
O2 Saturation: 43 %
Potassium: 4.3 mmol/L (ref 3.5–5.1)
Sodium: 136 mmol/L (ref 135–145)
TCO2: 25 mmol/L (ref 22–32)
pCO2, Ven: 42.3 mmHg — ABNORMAL LOW (ref 44.0–60.0)
pH, Ven: 7.359 (ref 7.250–7.430)
pO2, Ven: 25 mmHg — CL (ref 32.0–45.0)

## 2020-06-20 LAB — CBG MONITORING, ED
Glucose-Capillary: 591 mg/dL (ref 70–99)
Glucose-Capillary: 327 mg/dL — ABNORMAL HIGH (ref 70–99)

## 2020-06-20 LAB — BETA-HYDROXYBUTYRIC ACID: Beta-Hydroxybutyric Acid: 0.12 mmol/L (ref 0.05–0.27)

## 2020-06-20 LAB — LIPASE, BLOOD: Lipase: 48 U/L (ref 11–51)

## 2020-06-20 MED ORDER — CARVEDILOL 12.5 MG PO TABS
6.2500 mg | ORAL_TABLET | Freq: Two times a day (BID) | ORAL | Status: DC
  Administered 2020-06-20: 6.25 mg via ORAL
  Filled 2020-06-20: qty 2

## 2020-06-20 MED ORDER — INSULIN ASPART 100 UNIT/ML ~~LOC~~ SOLN
10.0000 [IU] | Freq: Once | SUBCUTANEOUS | Status: AC
  Administered 2020-06-20: 10 [IU] via SUBCUTANEOUS

## 2020-06-20 MED ORDER — HYDRALAZINE HCL 25 MG PO TABS
100.0000 mg | ORAL_TABLET | Freq: Once | ORAL | Status: AC
  Administered 2020-06-20: 100 mg via ORAL
  Filled 2020-06-20: qty 4

## 2020-06-20 MED ORDER — SODIUM CHLORIDE 0.9 % IV BOLUS
1000.0000 mL | Freq: Once | INTRAVENOUS | Status: AC
  Administered 2020-06-20: 1000 mL via INTRAVENOUS

## 2020-06-20 NOTE — Discharge Instructions (Addendum)
Please schedule follow-up appointment with your primary care doctor.  Today you were given your nighttime dose of Coreg and hydralazine. Please continue to check your blood sugar at home every hour for the next 1 to 2 hours. If your symptoms worsen or you have concerns please seek additional medical care and evaluation. You did have some abnormalities in your blood work however these appear to be your normal or directly related to your elevated blood sugar.

## 2020-06-20 NOTE — ED Triage Notes (Signed)
Pt arrives to ED with [facility] from home after daughter called due to blood sugar reading high and hyperglycemia. Pt was just seen here yesterday for same and preferred to go home. Pt herself has no complaints

## 2020-06-20 NOTE — ED Notes (Signed)
Pts daughter notified patient is up for discharge. She is on the way to pick up the patient.

## 2020-06-20 NOTE — ED Provider Notes (Signed)
[facility] EMERGENCY DEPARTMENT Provider Note   CSN: [id number] Arrival date & time: 06/20/20  1824     History Chief Complaint  Patient presents with  . Hyperglycemia    Chelsea Meza is a 85 y.o. female with a past medical history of DM, CKD, hyperlipidemia, hypertension, who presents today for evaluation of hyperglycemia. Patient was seen in the emergency room yesterday for the same.  At that point she had been treated with insulin, IV fluids along with carvedilol and hydralazine for her hypertension and wanted to go home.  Patient's visitor reports that patient had been getting switched from toujeo (glargine) to [place] (degludec) for insulin and had been misplaced causing her to miss doses.  Patient says she has had poor appetite over the past few days however has otherwise been feeling okay.  No fevers, cough, shortness of breath, no chest or abdominal pain.  She states that her daughter has been checking blood sugar and blood pressure.    She states that over the past few days her blood sugar has been in the 400-500s.  She reports compliance with her antihypertensives.  Patient already has close outpatient follow-up with PCP according to daughter.  Patient and daughter do not wish for admission, rather wish for treatment of her hyperglycemia as they should be able to resume her home regimen tomorrow.  HPI     Past Medical History:  Diagnosis Date  . Anemia   . CKD (chronic kidney disease) stage 3, GFR 30-59 ml/min ([name])   . Diabetes mellitus without complication ([place])   . Hyperlipidemia   . Hypertension   . TIA (transient ischemic attack)     Patient Active Problem List   Diagnosis Date Noted  . Hyperosmolar hyperglycemic state (HHS) ([place]) 04/05/2020  . Hypertensive urgency 04/05/2020  . Cardiac arrest ([place]) 04/05/2020  . Acute CHF (congestive heart failure) ([place]) 10/18/2018  . Hyperosmolar non-ketotic state in patient with type 2 diabetes  mellitus ([place]) 10/18/2018  . TIA (transient ischemic attack) 11/05/2017  . Hypokalemia 11/05/2017  . Hyponatremia 03/20/2015  . AKI (acute kidney injury) ([place]) 03/20/2015  . CKD (chronic kidney disease) stage 3, GFR 30-59 ml/min ([name]) 03/20/2015  . Hyperglycemia 03/20/2015  . HLD (hyperlipidemia) 03/20/2015  . Diabetes mellitus due to underlying condition with diabetic nephropathy ([place]) 03/20/2015  . Diabetic hyperosmolar non-ketotic state ([place]) 07/14/2012  . Lactic acidosis 07/14/2012  . DKA, type 2 ([place]) 07/13/2012  . Acute kidney injury superimposed on CKD ([place]) 07/13/2012  . PSVT (paroxysmal supraventricular tachycardia) ([place]) 07/13/2012  . Weakness of both legs 07/13/2012  . Paresthesia of left leg 07/13/2012  . Cough 07/13/2012  . Essential hypertension, benign 07/13/2012    Past Surgical History:  Procedure Laterality Date  . CHOLECYSTECTOMY       OB History   No obstetric history on file.     Family History  Problem Relation Age of Onset  . Stroke Mother   . Stroke Maternal Grandmother     Social History   Tobacco Use  . Smoking status: Never Smoker  . Smokeless tobacco: Never Used  Substance Use Topics  . Alcohol use: No  . Drug use: No    Home Medications Prior to Admission medications   Medication Sig Start Date End Date Taking? Authorizing Provider  aspirin EC 81 MG tablet Take 81 mg by mouth daily.    [provider]  atorvastatin (LIPITOR) 40 MG tablet Take 40 mg by mouth at bedtime. 04/02/20  [provider]  carvedilol (COREG) 12.5 MG tablet Take 12.5 mg by mouth daily. 05/13/20   [provider]  carvedilol (COREG) 6.25 MG tablet Take 1 tablet (6.25 mg total) by mouth 2 (two) times daily with a meal. Patient not taking: Reported on 06/19/2020 10/22/18   Tat, Shanon Brow, MD  Cyanocobalamin (VITAMIN B-12 CR PO) Take 1 tablet by mouth daily.    [provider]  furosemide (LASIX) 40 MG tablet Take 40 mg by mouth daily.   03/10/20   [provider]  hydrALAZINE (APRESOLINE) 100 MG tablet Take 1 tablet (100 mg total) by mouth 3 (three) times daily. 10/22/18   Orson Eva, MD  insulin aspart (NOVOLOG) 100 UNIT/ML FlexPen Inject 3 Units into the skin 3 (three) times daily with meals. Patient not taking: No sig reported 04/08/20   Barb Merino, MD  insulin glargine (LANTUS) 100 unit/mL SOPN Inject 25 Units into the skin daily. 04/08/20   Barb Merino, MD  insulin lispro (HUMALOG) 100 UNIT/ML KwikPen Inject 3 Units into the skin 3 (three) times daily with meals. 04/08/20   [provider]  ketorolac (ACULAR) 0.5 % ophthalmic solution Place 1 drop into the left eye 4 (four) times daily.  09/17/18   [provider]  ofloxacin (OCUFLOX) 0.3 % ophthalmic solution Place 1 drop into the left eye 4 (four) times daily. 09/16/18   [provider]  Vitamin D, Ergocalciferol, (DRISDOL) 1.25 MG (50000 UNIT) CAPS capsule Take 50,000 Units by mouth once a week. 01/08/20   [provider]    Allergies    Patient has no known allergies.  Review of Systems   Review of Systems  Constitutional: Positive for appetite change. Negative for chills and fever.  Eyes: Negative for visual disturbance.  Respiratory: Negative for cough and shortness of breath.   Cardiovascular: Negative for chest pain.  Gastrointestinal: Negative for abdominal pain, diarrhea, nausea and vomiting.  Endocrine: Positive for polydipsia and polyuria.  Musculoskeletal: Negative for arthralgias and myalgias.  Neurological: Negative for weakness and headaches.  Psychiatric/Behavioral: Negative for confusion.  All other systems reviewed and are negative.   Physical Exam Updated Vital Signs BP 138/85   Pulse (!) 59   Temp 98.9 F (37.2 C) (Oral)   Resp 14   SpO2 98%   Physical Exam Vitals and nursing note reviewed.  Constitutional:      General: She is not in acute distress.    Appearance: She is not diaphoretic.   HENT:     Head: Normocephalic and atraumatic.     Mouth/Throat:     Mouth: Mucous membranes are moist.  Eyes:     General: No scleral icterus.       Right eye: No discharge.        Left eye: No discharge.     Conjunctiva/sclera: Conjunctivae normal.  Cardiovascular:     Rate and Rhythm: Normal rate and regular rhythm.     Pulses: Normal pulses.     Heart sounds: Normal heart sounds.  Pulmonary:     Effort: Pulmonary effort is normal. No respiratory distress.     Breath sounds: No stridor.  Abdominal:     General: There is no distension.     Tenderness: There is no abdominal tenderness. There is no guarding.  Musculoskeletal:        General: No deformity.     Cervical back: Normal range of motion and neck supple.     Right lower leg: No edema.  Left lower leg: No edema.  Skin:    General: Skin is warm and dry.  Neurological:     General: No focal deficit present.     Mental Status: She is alert. Mental status is at baseline.     Motor: No abnormal muscle tone.  Psychiatric:        Mood and Affect: Mood normal.        Behavior: Behavior normal.     ED Results / Procedures / Treatments   Labs (all labs ordered are listed, but only abnormal results are displayed) Labs Reviewed  COMPREHENSIVE METABOLIC PANEL - Abnormal; Notable for the following components:      Result Value   Sodium 133 (*)    Glucose, Bld 653 (*)    BUN 72 (*)    Creatinine, Ser 3.17 (*)    Calcium 8.8 (*)    Total Protein 6.3 (*)    Albumin 2.8 (*)    GFR, Estimated 14 (*)    All other components within normal limits  CBC WITH DIFFERENTIAL/PLATELET - Abnormal; Notable for the following components:   Hemoglobin 11.8 (*)    Abs Immature Granulocytes 0.10 (*)    All other components within normal limits  CBG MONITORING, ED - Abnormal; Notable for the following components:   Glucose-Capillary 591 (*)    All other components within normal limits  I-STAT VENOUS BLOOD GAS, ED - Abnormal; Notable  for the following components:   pCO2, Ven 42.3 (*)    pO2, Ven 25.0 (*)    All other components within normal limits  CBG MONITORING, ED - Abnormal; Notable for the following components:   Glucose-Capillary 327 (*)    All other components within normal limits  URINE CULTURE  LIPASE, BLOOD  BETA-HYDROXYBUTYRIC ACID  URINALYSIS, ROUTINE W REFLEX MICROSCOPIC    EKG EKG Interpretation  Date/Time:  Sunday June 20 2020 19:29:52 EST Ventricular Rate:  63 PR Interval:    QRS Duration: 91 QT Interval:  418 QTC Calculation: 428 R Axis:   -23 Text Interpretation: Sinus rhythm Left ventricular hypertrophy Anterior Q waves, possibly due to LVH since last tracing no significant change Confirmed by Malvin Johns [phone]) on 06/20/2020 10:12:20 PM   Radiology No results found.  Procedures Procedures   Medications Ordered in ED Medications  carvedilol (COREG) tablet 6.25 mg (6.25 mg Oral Given 06/20/20 2123)  sodium chloride 0.9 % bolus 1,000 mL (0 mLs Intravenous Stopped 06/20/20 2245)  insulin aspart (novoLOG) injection 10 Units (10 Units Subcutaneous Given 06/20/20 2109)  hydrALAZINE (APRESOLINE) tablet 100 mg (100 mg Oral Given 06/20/20 2124)    ED Course  I have reviewed the triage vital signs and the nursing notes.  Pertinent labs & imaging results that were available during my care of the patient were reviewed by me and considered in my medical decision making (see chart for details).  Clinical Course as of 06/20/20 2342  Nancy Fetter Jun 20, 2020  2243 Patient reevaluated, she states that she feels fine and is ready to go home.  Her blood sugar has improved from 653 down to 327 and her blood pressure has improved with her home p.o. medications. [EH]    Clinical Course User Index [EH] Ollen Gross   MDM Rules/Calculators/A&P                         Patient is an 85 year old woman who presents today for evaluation of hypertension and hyperglycemia.  She was seen  yesterday for the same thing after she reports her daughter sent her in and states that again her daughter sent her in due to the numbers.  Here patient is hyperglycemic with a blood sugar of 653.  VBG is obtained which does show both low PO2 and CO2 however given that she is running 97 to 98% on room air I do not think this is accurate.  Beta hydroxybutyric acid is not elevated.  Lipase is normal.  CMP in addition to hyperglycemia does show elevated creatinine at 3.17, this appears consistent with her prior.  CBC shows mild anemia with a hemoglobin of 11.8.  I offered UA however patient declined stating that she does not think she has an infection.  Patient was hypertensive while in the emergency room.  She was treated with her Home p.o. pain medications including p.o. Coreg and hydralazine after which her blood pressure improved significantly.  I discussed option for admission with patient.  She does not wish for admission and reportedly they will be able to resume her home diabetes management tomorrow.  Return precautions were discussed with patient who states their understanding.  At the time of discharge patient denied any unaddressed complaints or concerns.  Patient is agreeable for discharge home.  Note: Portions of this report may have been transcribed using voice recognition software. Every effort was made to ensure accuracy; however, inadvertent computerized transcription errors may be present  Final Clinical Impression(s) / ED Diagnoses Final diagnoses:  Hyperglycemia    Rx / DC Orders ED Discharge Orders    None       Lorin Glass, PA-C 06/20/20 2342    Malvin Johns, MD 06/23/20 1421

## 2020-06-21 ENCOUNTER — Other Ambulatory Visit: Payer: Self-pay

## 2020-06-21 ENCOUNTER — Encounter (HOSPITAL_COMMUNITY): Payer: Self-pay | Admitting: Emergency Medicine

## 2020-06-21 ENCOUNTER — Inpatient Hospital Stay (HOSPITAL_COMMUNITY)
Admission: EM | Admit: 2020-06-21 | Discharge: 2020-06-23 | DRG: 637 | Disposition: A | Discharge status: Home / Self Care | Payer: Medicare Other | Attending: Internal Medicine | Admitting: Internal Medicine

## 2020-06-21 DIAGNOSIS — R6889 Other general symptoms and signs: Secondary | ICD-10-CM | POA: Diagnosis not present

## 2020-06-21 DIAGNOSIS — R404 Transient alteration of awareness: Secondary | ICD-10-CM | POA: Diagnosis not present

## 2020-06-21 DIAGNOSIS — I4891 Unspecified atrial fibrillation: Secondary | ICD-10-CM | POA: Diagnosis not present

## 2020-06-21 DIAGNOSIS — N183 Chronic kidney disease, stage 3 unspecified: Secondary | ICD-10-CM | POA: Diagnosis present

## 2020-06-21 DIAGNOSIS — Z6821 Body mass index (BMI) 21.0-21.9, adult: Secondary | ICD-10-CM | POA: Diagnosis not present

## 2020-06-21 DIAGNOSIS — Z823 Family history of stroke: Secondary | ICD-10-CM

## 2020-06-21 DIAGNOSIS — Z7982 Long term (current) use of aspirin: Secondary | ICD-10-CM

## 2020-06-21 DIAGNOSIS — G9341 Metabolic encephalopathy: Secondary | ICD-10-CM | POA: Diagnosis present

## 2020-06-21 DIAGNOSIS — I129 Hypertensive chronic kidney disease with stage 1 through stage 4 chronic kidney disease, or unspecified chronic kidney disease: Secondary | ICD-10-CM | POA: Diagnosis present

## 2020-06-21 DIAGNOSIS — E162 Hypoglycemia, unspecified: Secondary | ICD-10-CM | POA: Diagnosis not present

## 2020-06-21 DIAGNOSIS — N184 Chronic kidney disease, stage 4 (severe): Secondary | ICD-10-CM | POA: Diagnosis present

## 2020-06-21 DIAGNOSIS — R680 Hypothermia, not associated with low environmental temperature: Secondary | ICD-10-CM | POA: Diagnosis present

## 2020-06-21 DIAGNOSIS — U071 COVID-19: Secondary | ICD-10-CM | POA: Diagnosis present

## 2020-06-21 DIAGNOSIS — Z743 Need for continuous supervision: Secondary | ICD-10-CM | POA: Diagnosis not present

## 2020-06-21 DIAGNOSIS — E11649 Type 2 diabetes mellitus with hypoglycemia without coma: Secondary | ICD-10-CM | POA: Diagnosis not present

## 2020-06-21 DIAGNOSIS — E1122 Type 2 diabetes mellitus with diabetic chronic kidney disease: Secondary | ICD-10-CM | POA: Diagnosis present

## 2020-06-21 DIAGNOSIS — Z8673 Personal history of transient ischemic attack (TIA), and cerebral infarction without residual deficits: Secondary | ICD-10-CM | POA: Diagnosis not present

## 2020-06-21 DIAGNOSIS — R001 Bradycardia, unspecified: Secondary | ICD-10-CM | POA: Diagnosis present

## 2020-06-21 DIAGNOSIS — Z79899 Other long term (current) drug therapy: Secondary | ICD-10-CM

## 2020-06-21 DIAGNOSIS — E785 Hyperlipidemia, unspecified: Secondary | ICD-10-CM | POA: Diagnosis present

## 2020-06-21 DIAGNOSIS — E0821 Diabetes mellitus due to underlying condition with diabetic nephropathy: Secondary | ICD-10-CM

## 2020-06-21 DIAGNOSIS — I499 Cardiac arrhythmia, unspecified: Secondary | ICD-10-CM | POA: Diagnosis not present

## 2020-06-21 DIAGNOSIS — E440 Moderate protein-calorie malnutrition: Secondary | ICD-10-CM | POA: Diagnosis present

## 2020-06-21 DIAGNOSIS — T68XXXA Hypothermia, initial encounter: Secondary | ICD-10-CM | POA: Diagnosis not present

## 2020-06-21 DIAGNOSIS — Z794 Long term (current) use of insulin: Secondary | ICD-10-CM

## 2020-06-21 DIAGNOSIS — E1165 Type 2 diabetes mellitus with hyperglycemia: Secondary | ICD-10-CM

## 2020-06-21 DIAGNOSIS — E1110 Type 2 diabetes mellitus with ketoacidosis without coma: Secondary | ICD-10-CM | POA: Diagnosis present

## 2020-06-21 DIAGNOSIS — E782 Mixed hyperlipidemia: Secondary | ICD-10-CM | POA: Diagnosis not present

## 2020-06-21 DIAGNOSIS — I10 Essential (primary) hypertension: Secondary | ICD-10-CM | POA: Diagnosis present

## 2020-06-21 LAB — URINALYSIS, ROUTINE W REFLEX MICROSCOPIC
Bilirubin Urine: NEGATIVE
Glucose, UA: >=500 mg/dL — AB
Hgb urine dipstick: NEGATIVE
Ketones, ur: NEGATIVE mg/dL
Leukocytes,Ua: NEGATIVE
Nitrite: NEGATIVE
Protein, ur: 100 mg/dL — AB
Specific Gravity, Urine: 1.014 (ref 1.005–1.030)
pH: 5 (ref 5.0–8.0)

## 2020-06-21 LAB — CBG MONITORING, ED
Glucose-Capillary: 46 mg/dL — ABNORMAL LOW (ref 70–99)
Glucose-Capillary: 98 mg/dL (ref 70–99)
Glucose-Capillary: 144 mg/dL — ABNORMAL HIGH (ref 70–99)
Glucose-Capillary: 57 mg/dL — ABNORMAL LOW (ref 70–99)
Glucose-Capillary: 112 mg/dL — ABNORMAL HIGH (ref 70–99)
Glucose-Capillary: 106 mg/dL — ABNORMAL HIGH (ref 70–99)

## 2020-06-21 LAB — COMPREHENSIVE METABOLIC PANEL
ALT: 11 U/L (ref 0–44)
AST: 29 U/L (ref 15–41)
Albumin: 3 g/dL — ABNORMAL LOW (ref 3.5–5.0)
Alkaline Phosphatase: 77 U/L (ref 38–126)
Anion gap: 13 (ref 5–15)
BUN: 68 mg/dL — ABNORMAL HIGH (ref 8–23)
CO2: 23 mmol/L (ref 22–32)
Calcium: 9.3 mg/dL (ref 8.9–10.3)
Chloride: 105 mmol/L (ref 98–111)
Creatinine, Ser: 2.75 mg/dL — ABNORMAL HIGH (ref 0.44–1.00)
GFR, Estimated: 17 mL/min — ABNORMAL LOW (ref >60)
Glucose, Bld: 57 mg/dL — ABNORMAL LOW (ref 70–99)
Potassium: 3.9 mmol/L (ref 3.5–5.1)
Sodium: 141 mmol/L (ref 135–145)
Total Bilirubin: 0.8 mg/dL (ref 0.3–1.2)
Total Protein: 6.9 g/dL (ref 6.5–8.1)

## 2020-06-21 LAB — CBC WITH DIFFERENTIAL/PLATELET
Abs Immature Granulocytes: 0.12 10*3/uL — ABNORMAL HIGH (ref 0.00–0.07)
Basophils Absolute: 0 10*3/uL (ref 0.0–0.1)
Basophils Relative: 0 %
Eosinophils Absolute: 0 10*3/uL (ref 0.0–0.5)
Eosinophils Relative: 0 %
HCT: 40.8 % (ref 36.0–46.0)
Hemoglobin: 12.6 g/dL (ref 12.0–15.0)
Immature Granulocytes: 1 %
Lymphocytes Relative: 22 %
Lymphs Abs: 2.1 10*3/uL (ref 0.7–4.0)
MCH: 27.4 pg (ref 26.0–34.0)
MCHC: 30.9 g/dL (ref 30.0–36.0)
MCV: 88.7 fL (ref 80.0–100.0)
Monocytes Absolute: 0.3 10*3/uL (ref 0.1–1.0)
Monocytes Relative: 3 %
Neutro Abs: 7 10*3/uL (ref 1.7–7.7)
Neutrophils Relative %: 74 %
Platelets: 188 10*3/uL (ref 150–400)
RBC: 4.6 MIL/uL (ref 3.87–5.11)
RDW: 12.8 % (ref 11.5–15.5)
WBC: 9.5 10*3/uL (ref 4.0–10.5)
nRBC: 0 % (ref 0.0–0.2)

## 2020-06-21 LAB — RESP PANEL BY RT-PCR (FLU A&B, COVID) ARPGX2
Influenza A by PCR: NEGATIVE
Influenza B by PCR: NEGATIVE
SARS Coronavirus 2 by RT PCR: POSITIVE — AB

## 2020-06-21 MED ORDER — ONDANSETRON HCL 4 MG PO TABS: 4.0000 mg | ORAL_TABLET | Freq: Four times a day (QID) | ORAL | Status: DC | PRN

## 2020-06-21 MED ORDER — ACETAMINOPHEN 325 MG PO TABS: 650.0000 mg | ORAL_TABLET | Freq: Four times a day (QID) | ORAL | Status: DC | PRN

## 2020-06-21 MED ORDER — ALBUTEROL SULFATE (2.5 MG/3ML) 0.083% IN NEBU: 2.5000 mg | INHALATION_SOLUTION | RESPIRATORY_TRACT | Status: DC | PRN

## 2020-06-21 MED ORDER — ASPIRIN EC 81 MG PO TBEC
81.0000 mg | DELAYED_RELEASE_TABLET | Freq: Every day | ORAL | Status: DC
  Administered 2020-06-22 – 2020-06-23 (×2): 81 mg via ORAL
  Filled 2020-06-21 (×2): qty 1

## 2020-06-21 MED ORDER — ACETAMINOPHEN 650 MG RE SUPP: 650.0000 mg | Freq: Four times a day (QID) | RECTAL | Status: DC | PRN

## 2020-06-21 MED ORDER — ONDANSETRON HCL 4 MG/2ML IJ SOLN: 4.0000 mg | Freq: Four times a day (QID) | INTRAMUSCULAR | Status: DC | PRN

## 2020-06-21 MED ORDER — DEXTROSE-NACL 5-0.9 % IV SOLN: INTRAVENOUS | Status: AC

## 2020-06-21 MED ORDER — HYDRALAZINE HCL 50 MG PO TABS
100.0000 mg | ORAL_TABLET | Freq: Three times a day (TID) | ORAL | Status: DC
Start: 2020-06-21 — End: 2020-06-23
  Administered 2020-06-21 – 2020-06-23 (×5): 100 mg via ORAL
  Filled 2020-06-21 (×5): qty 2

## 2020-06-21 MED ORDER — HEPARIN SODIUM (PORCINE) 5000 UNIT/ML IJ SOLN
5000.0000 [IU] | Freq: Three times a day (TID) | INTRAMUSCULAR | Status: DC
  Administered 2020-06-21 – 2020-06-23 (×5): 5000 [IU] via SUBCUTANEOUS
  Filled 2020-06-21 (×5): qty 1

## 2020-06-21 MED ORDER — ATORVASTATIN CALCIUM 40 MG PO TABS
40.0000 mg | ORAL_TABLET | Freq: Every day | ORAL | Status: DC
  Administered 2020-06-21 – 2020-06-22 (×2): 40 mg via ORAL
  Filled 2020-06-21 (×2): qty 1

## 2020-06-21 MED ORDER — INSULIN ASPART 100 UNIT/ML ~~LOC~~ SOLN
0.0000 [IU] | SUBCUTANEOUS | Status: DC
  Administered 2020-06-22 (×2): 1 [IU] via SUBCUTANEOUS
  Administered 2020-06-22: 9 [IU] via SUBCUTANEOUS
  Administered 2020-06-22: 2 [IU] via SUBCUTANEOUS

## 2020-06-21 MED ORDER — POLYETHYLENE GLYCOL 3350 17 G PO PACK: 17.0000 g | PACK | Freq: Every day | ORAL | Status: DC | PRN

## 2020-06-21 NOTE — ED Notes (Signed)
Pt CBG 46. Given 240 mL orange juice.

## 2020-06-21 NOTE — H&P (Signed)
History and Physical    Chelsea Meza LZJ:[id number] DOB: 1936-03-25 DOA: 06/21/2020  PCP: Iona Beard, MD   Patient coming from: Home  I have personally briefly reviewed patient's old medical records in [place]  Chief Complaint:  Low blood glucose  HPI: Chelsea Meza is a 85 y.o. female with medical history significant of hypertension, hyperlipidemia, diabetes mellitus type 2, insulin-dependent, poorly controlled with prior episode of HONK and DKA, chronic kidney disease stage III-IV (baseline serum creatinine between 2 and 3), history of transient ischemic attack who came to hospital for evaluation of change in mental status due to hypoglycemia.  Patient was seen in ED at [facility] 2 days in a row.  Yesterday she was evaluated for hyperglycemia, and treated with administration of insulin and IV fluids with improvement.  Reportedly her home regimen including insulin Toujeo 25 units in the morning as well as NovoLog 3 units with each meal.  Patient became hyperglycemic, as her insulin was misplaced and she was unable to find it and use it.  After management in the ED she was discharged home in stable condition.   Same night post discharge she was hyperglycemic and reportedly she reach out to her PCP, who recommended administration of additional dose of insulin.  Patient did have leftover of Tresiba, and was given total of 65 units of insulin.  This morning she was found to have change in mental status, and her blood glucose was found to be in the 30s.  She was brought to ED for reevaluation. Currently patient is doing well.  She has no specific complaints.  Denies having fever chills, shortness of breath, chest pain, nausea vomiting, abdominal pain, dysuria or diarrhea.   On arrival to ED patient was found to have glucose of 46 mg/dL, as well as she was hypothermic, temperature 92.1 F. She was placed on Bair hugger, with ultimate improvement in temperature to 99.1 F.  Blood  pressure was stable 133/80, heart she was slightly bradycardic down to 47/min, with also improved with rewarming to 63/min.  No hypoxia, 97% on room air.  Initial glucose has improved with administration of orange juice, however subsequently decreased again to 57.  CBC showed no leukocytosis or anemia. Chemistry revealed elevation BUN/creatinine, with BUN 68 and creatinine 2.7, with normal CO2 and anion gap. Urinalysis did not demonstrate evidence of infection. Given fluctuation in blood glucose control and temperature, patient will be admitted to hospital for further evaluation and treatment  Review of Systems: Review of Systems  Constitutional: Negative for chills and fever.  HENT: Negative for hearing loss.   Eyes: Negative for blurred vision.  Respiratory: Negative for cough, shortness of breath and wheezing.   Cardiovascular: Negative for chest pain and leg swelling.  Gastrointestinal: Negative for abdominal pain, diarrhea, nausea and vomiting.  Genitourinary: Negative for dysuria, frequency and urgency.  Musculoskeletal: Negative for back pain, joint pain, myalgias and neck pain.  Neurological: Negative for dizziness, sensory change, speech change, focal weakness and weakness.  Psychiatric/Behavioral: Negative for depression. The patient is not nervous/anxious.    As per HPI otherwise all other systems reviewed and are negative.  Past Medical History:  Diagnosis Date  . Anemia   . CKD (chronic kidney disease) stage 3, GFR 30-59 ml/min ([name])   . Diabetes mellitus without complication ([place])   . Hyperlipidemia   . Hypertension   . TIA (transient ischemic attack)     Past Surgical History:  Procedure Laterality Date  . CHOLECYSTECTOMY  Social History  reports that she has never smoked. She has never used smokeless tobacco. She reports that she does not drink alcohol and does not use drugs.  No Known Allergies  Family History  Problem Relation Age of Onset  . Stroke Mother    . Stroke Maternal Grandmother    Prior to Admission medications   Medication Sig Start Date End Date Taking? Authorizing Provider  aspirin EC 81 MG tablet Take 81 mg by mouth daily.   Yes [provider]  atorvastatin (LIPITOR) 40 MG tablet Take 40 mg by mouth at bedtime. 04/02/20  Yes [provider]  carvedilol (COREG) 12.5 MG tablet Take 12.5 mg by mouth daily. 05/13/20  Yes [provider]  Cyanocobalamin (VITAMIN B-12 CR PO) Take 1 tablet by mouth daily.   Yes [provider]  furosemide (LASIX) 40 MG tablet Take 40 mg by mouth daily.  03/10/20  Yes [provider]  hydrALAZINE (APRESOLINE) 100 MG tablet Take 1 tablet (100 mg total) by mouth 3 (three) times daily. 10/22/18  Yes Tat, Shanon Brow, MD  insulin glargine (LANTUS) 100 unit/mL SOPN Inject 25 Units into the skin daily. 04/08/20  Yes Barb Merino, MD  carvedilol (COREG) 6.25 MG tablet Take 1 tablet (6.25 mg total) by mouth 2 (two) times daily with a meal. Patient not taking: No sig reported 10/22/18   Tat, Shanon Brow, MD  insulin aspart (NOVOLOG) 100 UNIT/ML FlexPen Inject 3 Units into the skin 3 (three) times daily with meals. Patient not taking: No sig reported 04/08/20   Barb Merino, MD  Vitamin D, Ergocalciferol, (DRISDOL) 1.25 MG (50000 UNIT) CAPS capsule Take 50,000 Units by mouth once a week. Patient not taking: Reported on 06/21/2020 01/08/20   [provider]    Physical Exam: Vitals:   06/21/20 1345 06/21/20 1400 06/21/20 1415 06/21/20 1430  BP: 130/89 129/81 (!) 159/97 (!) 141/87  Pulse: (!) 56 61 63 (!) 59  Resp: (!) 23 16 13 16   Temp:      TempSrc:      SpO2: 99% 99% 99% 99%  Weight:      Height:        Constitutional: NAD, calm, comfortable Vitals:   06/21/20 1345 06/21/20 1400 06/21/20 1415 06/21/20 1430  BP: 130/89 129/81 (!) 159/97 (!) 141/87  Pulse: (!) 56 61 63 (!) 59  Resp: (!) 23 16 13 16   Temp:      TempSrc:      SpO2: 99% 99% 99% 99%  Weight:       Height:       Eyes: PERRL, lids and conjunctivae normal ENMT: Mucous membranes are moist. Posterior pharynx clear of any exudate or lesions.Normal dentition.  Neck: normal, supple, no masses, no thyromegaly Respiratory: clear to auscultation bilaterally, no wheezing, no crackles. Normal respiratory effort. No accessory muscle use.  Cardiovascular: Regular rate and rhythm, no murmurs / rubs / gallops. No extremity edema. 2+ pedal pulses. No carotid bruits.  Abdomen: no tenderness, no masses palpated. No hepatosplenomegaly. Bowel sounds positive.  Musculoskeletal: no clubbing / cyanosis. No joint deformity upper and lower extremities. Good ROM, no contractures. Normal muscle tone.  Skin: no rashes, lesions, ulcers. No induration Neurologic: CN 2-12 grossly intact. Sensation intact, DTR normal. Strength 5/5 in all 4.  Psychiatric: Normal judgment and insight. Alert and oriented x 3. Normal mood.   Labs on Admission: I have personally reviewed following labs and imaging studies  CBC: Recent Labs  Lab 06/19/20 0855 06/20/20 1935  06/20/20 1946 06/21/20 0821  WBC 7.9 9.7  --  9.5  NEUTROABS  --  6.6  --  7.0  HGB 12.6 11.8* 12.6 12.6  HCT 40.0 39.0 37.0 40.8  MCV 88.1 89.9  --  88.7  PLT 161 158  --  962    Basic Metabolic Panel: Recent Labs  Lab 06/19/20 0855 06/19/20 1417 06/20/20 1935 06/20/20 1946 06/21/20 0821  NA 140 145 133* 136 141  K 4.5 3.4* 4.3 4.3 3.9  CL 102 115* 100  --  105  CO2 21* 19* 22  --  23  GLUCOSE 507* 387* 653*  --  57*  BUN 59* 49* 72*  --  68*  CREATININE 2.99* 2.33* 3.17*  --  2.75*  CALCIUM 9.3 7.3* 8.8*  --  9.3    GFR: Estimated Creatinine Clearance: 16.5 mL/min (A) (by C-G formula based on SCr of 2.75 mg/dL (H)).  Liver Function Tests: Recent Labs  Lab 06/20/20 1935 06/21/20 0821  AST 21 29  ALT 10 11  ALKPHOS 72 77  BILITOT 0.7 0.8  PROT 6.3* 6.9  ALBUMIN 2.8* 3.0*    Urine analysis:    Component Value Date/Time    COLORURINE YELLOW 06/21/2020 0935   APPEARANCEUR HAZY (A) 06/21/2020 0935   LABSPEC 1.014 06/21/2020 0935   PHURINE 5.0 06/21/2020 0935   GLUCOSEU >=500 (A) 06/21/2020 0935   HGBUR NEGATIVE 06/21/2020 0935   BILIRUBINUR NEGATIVE 06/21/2020 0935   KETONESUR NEGATIVE 06/21/2020 0935   PROTEINUR 100 (A) 06/21/2020 0935   UROBILINOGEN 0.2 07/13/2012 1236   NITRITE NEGATIVE 06/21/2020 0935   LEUKOCYTESUR NEGATIVE 06/21/2020 0935    Radiological Exams on Admission: No results found.  EKG: Independently reviewed.    Assessment/Plan Principal Problem:   Poorly controlled diabetes mellitus ([place]) Active Problems:   Essential hypertension, benign   CKD (chronic kidney disease) stage 3, GFR 30-59 ml/min ([name])   HLD (hyperlipidemia)   Diabetes mellitus due to underlying condition with diabetic nephropathy ([name])   Diabetes mellitus type 2, insulin-dependent poorly controlled with hypoglycemia Acute metabolic encephalopathy due to hypoglycemia Hypothermia Patient presentation is likely due to hypoglycemia that led to bradycardia, hypothermia. Work-up in the ED negative for infectious process. Currently she is on [facility] and improving.  Her blood glucose is still labile. Continue to monitor in hospital, while holding insulin regimen.  Resume once her glucose is improved. Obtain diabetic education consultation for additional recommendation  Essential hypertension Blood pressure reasonably well controlled.  Continue hydralazine, but holding Coreg due to bradycardia.  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  Creatinine in the past varied between 2 and 4, although chronically appears to be somewhere between 2 and 3.  Continue gentle volume expansion.  Holding diuretics  DVT prophylaxis: Heparin Code Status:        Full code Family Communication:  Attempted to reach out to her daughter,  but without successful attempt Disposition Plan:   Patient is from:  Home  Anticipated DC to:  Home  Anticipated DC date:  06/22/2020  Anticipated DC barriers: None  Consults called:  Diabetic education Admission status:  Inpatient  Severity of Illness: The appropriate patient status for this patient is INPATIENT. Inpatient status is judged to be reasonable and necessary in order to provide the required intensity of service to ensure the patient's safety. The patient's presenting symptoms, physical exam findings, and initial radiographic and laboratory data in  the context of their chronic comorbidities is felt to place them at high risk for further clinical deterioration. Furthermore, it is not anticipated that the patient will be medically stable for discharge from the hospital within 2 midnights of admission. The following factors support the patient status of inpatient.   " The patient's presenting symptoms include , altered mental status with hypothermia and hypoglycemia. " The worrisome physical exam findings include.  Changes in mental status and hypothermia " The initial radiographic and laboratory data are worrisome because of significant hyperglycemia " The chronic co-morbidities include hypertension, diabetes mellitus type 2, hyperlipidemia and chronic kidney disease stage III-IV  * I certify that at the point of admission it is my clinical judgment that the patient will require inpatient hospital care spanning beyond 2 midnights from the point of admission due to high intensity of service, high risk for further deterioration and high frequency of surveillance required.Allie Dimmer MD [facility]  How to contact the [facility] Attending or Consulting provider [place] or covering provider during after hours [place], for this patient?   1. Check the care team in [facility] and look for a) attending/consulting [facility] provider listed and b) the [facility] team listed 2. Log into [email or web address]  and use [place]'s universal password to access. If you do not have the password, please contact the hospital operator. 3. Locate the [facility] provider you are looking for under [facility] and page to a number that you can be directly reached. 4. If you still have difficulty reaching the provider, please page the [facility] (Director on Call) for the Hospitalists listed on amion for assistance.  06/21/2020, 3:23 PM

## 2020-06-21 NOTE — ED Triage Notes (Signed)
Pt BIB [facility] from home where pt lives with daughter. EMS called out for hypoglycemia, initial CBG 30 with daughter. EMS arrived, initial CBG 67 with EMS. EMS administered 15g oral glucose and food, CBG then 96. Pt difficult to arouse, bradycardic on initial exam with HR in 40s.   EMS VS BP 200/100 HR 40 SpO2 98% RA RR 16

## 2020-06-21 NOTE — ED Notes (Signed)
Bair Hugger applied on high temp. Pt resting comfortably.

## 2020-06-21 NOTE — ED Notes (Signed)
Rectal temp taken on pt...resulted at 92.1...gave pt more warm blankets and will notify nurse.

## 2020-06-21 NOTE — ED Notes (Signed)
Notified Khatri PA about pt CBG of 57. Pt provided w/ OJ w/ sugar and peanut butter and crackers.

## 2020-06-21 NOTE — ED Provider Notes (Signed)
[facility] EMERGENCY DEPARTMENT Provider Note   CSN: [id number] Arrival date & time: 06/21/20  0747     History Chief Complaint  Patient presents with  . Hypoglycemia  . Altered Mental Status    Chelsea Meza is a 85 y.o. female with a past medical history of IDDM, hypertension, stage III CKD, anemia presenting to the ED with a chief complaint of hypoglycemia. Patient was seen and evaluated in the ER yesterday for hyperglycemia.  She was treated with insulin and IV fluids.  It appears that she took additional insulin when she went home.  Her daughter checked her blood sugar this morning was found to be in the 30s. Patient states that she has been compliant with her medications including this morning.   HPI     Past Medical History:  Diagnosis Date  . Anemia   . CKD (chronic kidney disease) stage 3, GFR 30-59 ml/min ([name])   . Diabetes mellitus without complication ([place])   . Hyperlipidemia   . Hypertension   . TIA (transient ischemic attack)     Patient Active Problem List   Diagnosis Date Noted  . Poorly controlled diabetes mellitus ([place]) 06/21/2020  . Hyperosmolar hyperglycemic state (HHS) ([place]) 04/05/2020  . Hypertensive urgency 04/05/2020  . Cardiac arrest ([place]) 04/05/2020  . Acute CHF (congestive heart failure) ([place]) 10/18/2018  . Hyperosmolar non-ketotic state in patient with type 2 diabetes mellitus ([place]) 10/18/2018  . TIA (transient ischemic attack) 11/05/2017  . Hypokalemia 11/05/2017  . Hyponatremia 03/20/2015  . AKI (acute kidney injury) ([place]) 03/20/2015  . CKD (chronic kidney disease) stage 3, GFR 30-59 ml/min ([name]) 03/20/2015  . Hyperglycemia 03/20/2015  . HLD (hyperlipidemia) 03/20/2015  . Diabetes mellitus due to underlying condition with diabetic nephropathy ([place]) 03/20/2015  . Diabetic hyperosmolar non-ketotic state ([place]) 07/14/2012  . Lactic acidosis 07/14/2012  . DKA, type 2 ([place]) 07/13/2012  . Acute kidney injury  superimposed on CKD ([place]) 07/13/2012  . PSVT (paroxysmal supraventricular tachycardia) ([place]) 07/13/2012  . Weakness of both legs 07/13/2012  . Paresthesia of left leg 07/13/2012  . Cough 07/13/2012  . Essential hypertension, benign 07/13/2012    Past Surgical History:  Procedure Laterality Date  . CHOLECYSTECTOMY       OB History   No obstetric history on file.     Family History  Problem Relation Age of Onset  . Stroke Mother   . Stroke Maternal Grandmother     Social History   Tobacco Use  . Smoking status: Never Smoker  . Smokeless tobacco: Never Used  Substance Use Topics  . Alcohol use: No  . Drug use: No    Home Medications Prior to Admission medications   Medication Sig Start Date End Date Taking? Authorizing Provider  aspirin EC 81 MG tablet Take 81 mg by mouth daily.   Yes [provider]  atorvastatin (LIPITOR) 40 MG tablet Take 40 mg by mouth at bedtime. 04/02/20  Yes [provider]  carvedilol (COREG) 12.5 MG tablet Take 12.5 mg by mouth daily. 05/13/20  Yes [provider]  Cyanocobalamin (VITAMIN B-12 CR PO) Take 1 tablet by mouth daily.   Yes [provider]  furosemide (LASIX) 40 MG tablet Take 40 mg by mouth daily.  03/10/20  Yes [provider]  hydrALAZINE (APRESOLINE) 100 MG tablet Take 1 tablet (100 mg total) by mouth 3 (three) times daily. 10/22/18  Yes Tat, Shanon Brow, MD  insulin glargine (LANTUS) 100 unit/mL SOPN Inject 25 Units  into the skin daily. 04/08/20  Yes Barb Merino, MD  carvedilol (COREG) 6.25 MG tablet Take 1 tablet (6.25 mg total) by mouth 2 (two) times daily with a meal. Patient not taking: No sig reported 10/22/18   Tat, Shanon Brow, MD  insulin aspart (NOVOLOG) 100 UNIT/ML FlexPen Inject 3 Units into the skin 3 (three) times daily with meals. Patient not taking: No sig reported 04/08/20   Barb Merino, MD  Vitamin D, Ergocalciferol, (DRISDOL) 1.25 MG (50000 UNIT) CAPS capsule Take 50,000 Units by  mouth once a week. Patient not taking: Reported on 06/21/2020 01/08/20   [provider]    Allergies    Patient has no known allergies.  Review of Systems   Review of Systems  Unable to perform ROS: Age  Respiratory: Negative for shortness of breath.   Cardiovascular: Negative for chest pain.  Gastrointestinal: Negative for abdominal pain, diarrhea and vomiting.    Physical Exam Updated Vital Signs BP (!) 141/87   Pulse (!) 59   Temp (!) 97 F (36.1 C) (Axillary)   Resp 16   Ht 5\' 9"  (1.753 m)   Wt 69.4 kg   SpO2 99%   BMI 22.59 kg/m   Physical Exam Vitals and nursing note reviewed.  Constitutional:      General: She is not in acute distress.    Appearance: She is well-developed and well-nourished.     Comments: Speaking complete sentences without difficulty.  HENT:     Head: Normocephalic and atraumatic.     Nose: Nose normal.  Eyes:     General: No scleral icterus.       Left eye: No discharge.     Extraocular Movements: EOM normal.     Conjunctiva/sclera: Conjunctivae normal.  Cardiovascular:     Rate and Rhythm: Normal rate and regular rhythm.     Pulses: Intact distal pulses.     Heart sounds: Normal heart sounds. No murmur heard. No friction rub. No gallop.   Pulmonary:     Effort: Pulmonary effort is normal. No respiratory distress.     Breath sounds: Normal breath sounds.  Abdominal:     General: Bowel sounds are normal. There is no distension.     Palpations: Abdomen is soft.     Tenderness: There is no abdominal tenderness. There is no guarding.     Comments: Abdomen is soft, nontender nondistended.  Musculoskeletal:        General: No edema. Normal range of motion.     Cervical back: Normal range of motion and neck supple.  Skin:    General: Skin is warm and dry.     Findings: No rash.  Neurological:     Mental Status: She is alert.     Motor: No abnormal muscle tone.     Coordination: Coordination normal.     Comments: Alert,  oriented to self and time.  She does not know where she is.  She is able to name her daughter.  No facial asymmetry noted.  Strength 5/5 in bilateral upper and lower extremities.  Psychiatric:        Mood and Affect: Mood and affect normal.     ED Results / Procedures / Treatments   Labs (all labs ordered are listed, but only abnormal results are displayed) Labs Reviewed  CBC WITH DIFFERENTIAL/PLATELET - Abnormal; Notable for the following components:      Result Value   Abs Immature Granulocytes 0.12 (*)    All other components within  normal limits  COMPREHENSIVE METABOLIC PANEL - Abnormal; Notable for the following components:   Glucose, Bld 57 (*)    BUN 68 (*)    Creatinine, Ser 2.75 (*)    Albumin 3.0 (*)    GFR, Estimated 17 (*)    All other components within normal limits  URINALYSIS, ROUTINE W REFLEX MICROSCOPIC - Abnormal; Notable for the following components:   APPearance HAZY (*)    Glucose, UA >=500 (*)    Protein, ur 100 (*)    Bacteria, UA RARE (*)    All other components within normal limits  CBG MONITORING, ED - Abnormal; Notable for the following components:   Glucose-Capillary 46 (*)    All other components within normal limits  CBG MONITORING, ED - Abnormal; Notable for the following components:   Glucose-Capillary 144 (*)    All other components within normal limits  CBG MONITORING, ED - Abnormal; Notable for the following components:   Glucose-Capillary 57 (*)    All other components within normal limits  CULTURE, BLOOD (ROUTINE X 2)  CULTURE, BLOOD (ROUTINE X 2)  URINE CULTURE  RESP PANEL BY RT-PCR (FLU A&B, COVID) [id number]  CBG MONITORING, ED    EKG EKG Interpretation  Date/Time:  Monday June 21 2020 09:21:58 EST Ventricular Rate:  49 PR Interval:    QRS Duration: 104 QT Interval:  486 QTC Calculation: 439 R Axis:   94 Text Interpretation: Right and left arm electrode reversal, interpretation assumes no reversal Junctional rhythm Anterior  infarct, old Borderline repolarization abnormality Borderline ST elevation, lateral leads Sinus rhythm, no STEMI Confirmed by Lavenia Atlas [phone]) on 06/21/2020 9:31:53 AM   Radiology No results found.  Procedures Procedures   Medications Ordered in ED Medications - No data to display  ED Course  I have reviewed the triage vital signs and the nursing notes.  Pertinent labs & imaging results that were available during my care of the patient were reviewed by me and considered in my medical decision making (see chart for details).  Clinical Course as of 06/21/20 1516  Mon Jun 21, 2020  0813 Glucose-Capillary(!): 46 Given orange juice. [HK]  0813 I attempted to call daughter, listed as emergency contact over the phone with no answer. [HK]  0820 Attempted to call daughter unsuccessful 3x. [HK]  0911 Glucose-Capillary: 98 [HK]  0917 Temp(!): 92.1 F (33.4 C) We will place her in bear hugger. [HK]  29 Daughter Chelsea Meza Spoke to patient's daughter, Chelsea Hammans over the phone.  States that she did not give her any medications specifically no insulin last night when she left the hospital.  States that this morning she woke up and was calling the daughter for help, was not acting right and she checked her blood sugars and found to be in the 30s.  She initially told me that she had misplaced her insulin at home but then told me prior to coming to the ER yesterday she had given her 35 units of [place]. States that she has otherwise been in her usual state of health.  No complaints of vomiting, diarrhea or UTI symptoms.  No fevers. [HK]  1052 Glucose-Capillary(!): 144 [HK]  1126 Creatinine(!): 2.75 Around baseline [HK]  1227 Nitrite: NEGATIVE [HK]  1227 Leukocytes,Ua: NEGATIVE [HK]  1235 Spoke to patient's daughter over the phone again.  Gave her an update about her work-up.  I feel that patient, daughter will benefit from speaking to the diabetes coordinator here at the  bedside to make sure they are taking appropriate medications and appropriate timing. [HK]  1235 Temp(!): 97 F (36.1 C) [HK]  1238 Glucose-Capillary(!): 57 Patient will require admission for ongoing hypoglycemia. Daughter is now telling me she was actually given 60 units of Tresiba yesterday. [HK]    Clinical Course User Index [HK] Delia Heady, PA-C   MDM Rules/Calculators/A&P                          85 year old female with past medical history of IDDM, hypertension, stage III CKD and anemia presenting to the ED with a chief complaint of hypoglycemia.  History provided by chart review, patient in conjunction with her daughter over the phone.  Patient was seen twice in the past 2 days for hyperglycemia.  It appears that she had misplaced her medications.  Her daughter then tells me that they did get more Tresiba from her PCP yesterday so her daughter gave her 35 units.  This is more than she typically gets and she did so because her PCP told her to take 35 units.  Her sugars were running in the 500s.  When her hyperglycemia did not improve she was sent to the ER.  She was given 10 units of NovoLog and a liter of IV fluids here and was discharged home with improvement in her blood sugar to the 300s.  She was offered admission at the time but she declined.  This morning patient called daughter for help and found to be hypoglycemic in the 67s.  Daughter states that she was not given additional insulin when she went home last night.  She did not take any insulin this morning either.  Found to be hypothermic here as well.  CMP with a creatinine of 2.75 which is around her baseline and improved since yesterday.  Initial CBG was 46 and patient was given juice.  CBC is unremarkable.  Urinalysis without signs of infection.  She was placed on a bear hugger with improvement in her body temperature.  Her CBG improved to 144. I spoke to daughter over the phone and I feel that they would benefit from consultation  with a diabetes coordinator at the bedside to ensure that patient is properly medicated to avoid future ED visits as she has been in the past three days.  Unfortunately I did not receive a call back from diabetes coordinator.  Dr. Dina Rich, my attending and I at the bedside spoke to patient's daughter who is now here.  She states that she was able to locate the patient's insulin and will be able to pick them up tonight after work.  We attempted to plan through the remainder of her insulin dosage for today and were planning on refilling them if we were able to discharge her.  Unfortunately on her recheck her CBG dropped to 57 despite p.o. intake.  Daughter then tells me that she was actually given 60 units of Tresiba yesterday. I am unsure exactly how much insulin she was given but I do feel that she will benefit from observation as she remains hyperglycemic here and will need to discuss this with diabetes coordinator to ensure that she has a proper regimen at home. Daughter and patient are agreeable to the plan. I have admitted patient to hospitalist service.   Portions of this note were generated with Lobbyist. Dictation errors may occur despite best attempts at proofreading.  Final Clinical Impression(s) / ED Diagnoses Final diagnoses:  Hypoglycemia  Hypothermia, initial encounter    Rx / DC Orders ED Discharge Orders    None       Delia Heady, PA-C 06/21/20 1516    Lorelle Gibbs, DO 06/21/20 1938

## 2020-06-21 NOTE — ED Notes (Signed)
Rechecked pt's CBG...resulted at 98. Still unable to get pt's temp...will try again in 15-20 minutes and if unsuccessful will get rectal temp.

## 2020-06-21 NOTE — ED Notes (Signed)
Bair Hugger removed from pt. Pt axillary temp 99.1

## 2020-06-22 ENCOUNTER — Encounter (HOSPITAL_COMMUNITY): Payer: Self-pay | Admitting: Internal Medicine

## 2020-06-22 DIAGNOSIS — E11649 Type 2 diabetes mellitus with hypoglycemia without coma: Principal | ICD-10-CM

## 2020-06-22 DIAGNOSIS — E1165 Type 2 diabetes mellitus with hyperglycemia: Secondary | ICD-10-CM

## 2020-06-22 DIAGNOSIS — E782 Mixed hyperlipidemia: Secondary | ICD-10-CM

## 2020-06-22 DIAGNOSIS — I10 Essential (primary) hypertension: Secondary | ICD-10-CM

## 2020-06-22 LAB — URINE CULTURE
Culture: NO GROWTH
Culture: NO GROWTH

## 2020-06-22 LAB — CBG MONITORING, ED: Glucose-Capillary: 152 mg/dL — ABNORMAL HIGH (ref 70–99)

## 2020-06-22 LAB — GLUCOSE, CAPILLARY
Glucose-Capillary: 128 mg/dL — ABNORMAL HIGH (ref 70–99)
Glucose-Capillary: 123 mg/dL — ABNORMAL HIGH (ref 70–99)
Glucose-Capillary: 354 mg/dL — ABNORMAL HIGH (ref 70–99)
Glucose-Capillary: 371 mg/dL — ABNORMAL HIGH (ref 70–99)
Glucose-Capillary: 239 mg/dL — ABNORMAL HIGH (ref 70–99)

## 2020-06-22 LAB — CBC
HCT: 37.3 % (ref 36.0–46.0)
Hemoglobin: 12.1 g/dL (ref 12.0–15.0)
MCH: 27.8 pg (ref 26.0–34.0)
MCHC: 32.4 g/dL (ref 30.0–36.0)
MCV: 85.7 fL (ref 80.0–100.0)
Platelets: 167 10*3/uL (ref 150–400)
RBC: 4.35 MIL/uL (ref 3.87–5.11)
RDW: 12.9 % (ref 11.5–15.5)
WBC: 11.3 10*3/uL — ABNORMAL HIGH (ref 4.0–10.5)
nRBC: 0 % (ref 0.0–0.2)

## 2020-06-22 LAB — BASIC METABOLIC PANEL
Anion gap: 10 (ref 5–15)
BUN: 58 mg/dL — ABNORMAL HIGH (ref 8–23)
CO2: 21 mmol/L — ABNORMAL LOW (ref 22–32)
Calcium: 8.9 mg/dL (ref 8.9–10.3)
Chloride: 107 mmol/L (ref 98–111)
Creatinine, Ser: 2.23 mg/dL — ABNORMAL HIGH (ref 0.44–1.00)
GFR, Estimated: 21 mL/min — ABNORMAL LOW (ref >60)
Glucose, Bld: 113 mg/dL — ABNORMAL HIGH (ref 70–99)
Potassium: 3.5 mmol/L (ref 3.5–5.1)
Sodium: 138 mmol/L (ref 135–145)

## 2020-06-22 MED ORDER — CARVEDILOL 6.25 MG PO TABS
6.2500 mg | ORAL_TABLET | Freq: Two times a day (BID) | ORAL | Status: DC
  Administered 2020-06-22 – 2020-06-23 (×2): 6.25 mg via ORAL
  Filled 2020-06-22 (×2): qty 1

## 2020-06-22 MED ORDER — ADULT MULTIVITAMIN W/MINERALS CH
1.0000 | ORAL_TABLET | Freq: Every day | ORAL | Status: DC
  Administered 2020-06-22 – 2020-06-23 (×2): 1 via ORAL
  Filled 2020-06-22 (×2): qty 1

## 2020-06-22 MED ORDER — FUROSEMIDE 40 MG PO TABS
40.0000 mg | ORAL_TABLET | Freq: Every day | ORAL | Status: DC
  Administered 2020-06-22 – 2020-06-23 (×2): 40 mg via ORAL
  Filled 2020-06-22 (×2): qty 1

## 2020-06-22 MED ORDER — SODIUM CHLORIDE 0.9 % IV SOLN
100.0000 mg | Freq: Every day | INTRAVENOUS | Status: DC
  Administered 2020-06-23: 100 mg via INTRAVENOUS
  Filled 2020-06-22: qty 20

## 2020-06-22 MED ORDER — ENSURE ENLIVE PO LIQD
237.0000 mL | Freq: Two times a day (BID) | ORAL | Status: DC
  Administered 2020-06-22 – 2020-06-23 (×3): 237 mL via ORAL

## 2020-06-22 MED ORDER — INFLUENZA VAC A&B SA ADJ QUAD 0.5 ML IM PRSY
0.5000 mL | PREFILLED_SYRINGE | INTRAMUSCULAR | Status: DC
  Filled 2020-06-22: qty 0.5

## 2020-06-22 MED ORDER — SODIUM CHLORIDE 0.9 % IV SOLN
200.0000 mg | Freq: Once | INTRAVENOUS | Status: AC
  Administered 2020-06-22: 200 mg via INTRAVENOUS
  Filled 2020-06-22: qty 40

## 2020-06-22 MED ORDER — INSULIN GLARGINE 100 UNIT/ML ~~LOC~~ SOLN
13.0000 [IU] | Freq: Every day | SUBCUTANEOUS | Status: DC
  Administered 2020-06-22: 13 [IU] via SUBCUTANEOUS
  Filled 2020-06-22 (×2): qty 0.13

## 2020-06-22 MED ORDER — INSULIN ASPART 100 UNIT/ML ~~LOC~~ SOLN
0.0000 [IU] | Freq: Three times a day (TID) | SUBCUTANEOUS | Status: DC
  Administered 2020-06-22: 9 [IU] via SUBCUTANEOUS
  Administered 2020-06-23: 3 [IU] via SUBCUTANEOUS
  Administered 2020-06-23: 2 [IU] via SUBCUTANEOUS

## 2020-06-22 NOTE — Progress Notes (Signed)
Unit nurse gave AD to patient and will page Chaplain if assistance is needed.  Will follow as needed.  Jaclynn Major, [place], [facility], Pager [phone]

## 2020-06-22 NOTE — Plan of Care (Signed)
  Problem: Nutrition Goal: Nutritional status is improving Description: Monitor and assess patient for malnutrition (ex- brittle hair, bruises, dry skin, pale skin and conjunctiva, muscle wasting, smooth red tongue, and disorientation). Collaborate with interdisciplinary team and initiate plan and interventions as ordered.  Monitor patient's weight and dietary intake as ordered or per policy. Utilize nutrition screening tool and intervene per policy. Determine patient's food preferences and provide high-protein, high-caloric foods as appropriate.  Outcome: Progressing   Problem: Education: Goal: Knowledge of General Education information will improve Description: Including pain rating scale, medication(s)/side effects and non-pharmacologic comfort measures Outcome: Progressing   Problem: Health Behavior/Discharge Planning: Goal: Ability to manage health-related needs will improve Outcome: Progressing   Problem: Clinical Measurements: Goal: Ability to maintain clinical measurements within normal limits will improve Outcome: Progressing Goal: Will remain free from infection Outcome: Progressing Goal: Diagnostic test results will improve Outcome: Progressing Goal: Respiratory complications will improve Outcome: Progressing Goal: Cardiovascular complication will be avoided Outcome: Progressing   Problem: Activity: Goal: Risk for activity intolerance will decrease Outcome: Progressing   Problem: Nutrition: Goal: Adequate nutrition will be maintained Outcome: Progressing   Problem: Coping: Goal: Level of anxiety will decrease Outcome: Progressing   Problem: Elimination: Goal: Will not experience complications related to bowel motility Outcome: Progressing Goal: Will not experience complications related to urinary retention Outcome: Progressing   Problem: Pain Managment: Goal: General experience of comfort will improve Outcome: Progressing   Problem: Safety: Goal: Ability to  remain free from injury will improve Outcome: Progressing   Problem: Skin Integrity: Goal: Risk for impaired skin integrity will decrease Outcome: Progressing   

## 2020-06-22 NOTE — Progress Notes (Signed)
Initial Nutrition Assessment  DOCUMENTATION CODES:  Non-severe (moderate) malnutrition in context of acute illness/injury  INTERVENTION:  Ensure Enlive po BID, each supplement provides 350 kcal and 20 grams of protein.   Add MVI daily.  NUTRITION DIAGNOSIS:  Moderate Malnutrition related to lethargy/confusion,acute illness,other (see comment) (d/t hyper and hypoglycemic episodes) as evidenced by moderate fat depletion,mild muscle depletion,per patient/family report.  GOAL:  Patient will meet greater than or equal to 90% of their needs  MONITOR:  PO intake,Supplement acceptance  REASON FOR ASSESSMENT:  Malnutrition Screening Tool   ASSESSMENT:  85 yo female with PMH of HTN, hyperlipidemia, T2DM, insulin-dependence, prior episode of HONK and DKA, CKD stage 3-4, and TIA who is admitted with change in mental status d/t hypoglyecmia. Dx with acute metabolic encephalopathy d/t hypoglycemia. 2/19 - ED visit for hyperglycemia 2/20 - ED visit for hyperglycemia 2/21 - admit to [facility] for encephalopathy due to hypoglycemia  Spoke with pt this afternoon while sitting in chair. Pt seemed a little confused and was hard of hearing. Pt reported not having much of an appetite recently and little improvement since admission. She reports her clothes are "falling off." She also reports no energy and just wanting to sleep all day, not wanting to eat or drink. Per Epic, there was a 3.2% wt change from 12/21 until today.  Encouraged intake of Ensure twice per day and eating what she can to meet her estimated needs.  Relevant Medications: Novolog  Labs: Glucose 113 HbA1c: 13.4% (03/2020)  NUTRITION - FOCUSED PHYSICAL EXAM: Flowsheet Row Most Recent Value  Orbital Region Mild depletion  Upper Arm Region Moderate depletion  Thoracic and Lumbar Region Moderate depletion  Buccal Region Mild depletion  Tem[place] Region No depletion  Clavicle Bone Region Mild depletion  Clavicle and Acromion Bone Region  Moderate depletion  Scapular Bone Region Moderate depletion  Dorsal Hand Mild depletion  Patellar Region Mild depletion  Anterior Thigh Region Mild depletion  Posterior Calf Region Moderate depletion  Edema (RD Assessment) None  Hair Reviewed  Eyes Reviewed  [pale conjunctiva]  Mouth Reviewed  Skin Reviewed  [dry, flaky]  Nails Reviewed  [pale nail beds]     Diet Order:   Diet Order            Diet Carb Modified Fluid consistency: Thin; Room service appropriate? Yes  Diet effective now                EDUCATION NEEDS:  Education needs have been addressed  Skin:  Skin Assessment: Reviewed RN Assessment  Last BM:  2/22  Height:  Ht Readings from Last 1 Encounters:  06/22/20 5\' 9"  (1.753 m)   Weight:  Wt Readings from Last 1 Encounters:  06/22/20 67.2 kg   Ideal Body Weight:  66 kg  BMI:  Body mass index is 21.88 kg/m.  Estimated Nutritional Needs:  Kcal:  1700-1900 Protein:  85-100 grams Fluid:  >1.5 L  Derrel Nip, RD, LDN Registered Dietitian After Hours/Weekend Pager # in [place]

## 2020-06-22 NOTE — Progress Notes (Addendum)
Inpatient Diabetes Program Recommendations  AACE/ADA: New Consensus Statement on Inpatient Glycemic Control (2015)  Target Ranges:  Prepandial:   less than 140 mg/dL      Peak postprandial:   less than 180 mg/dL (1-2 hours)      Critically ill patients:  140 - 180 mg/dL   Lab Results  Component Value Date   GLUCAP 354 (H) 06/22/2020   HGBA1C 13.6 (H) 04/05/2020    Review of Glycemic Control Results for Chelsea Meza, STAHLMAN (MRN [id number]) as of 06/22/2020 14:06  Ref. Range 06/22/2020 00:38 06/22/2020 04:02 06/22/2020 07:19 06/22/2020 12:40  Glucose-Capillary Latest Ref Range: 70 - 99 mg/dL 152 (H) 128 (H) 123 (H) 354 (H)  Results for Chelsea Meza, [name] (MRN [id number]) as of 06/22/2020 14:06  Ref. Range 07/13/2012 16:40 03/20/2015 12:51 11/06/2017 04:33 10/17/2018 23:25 04/05/2020 04:30  Hemoglobin A1C Latest Ref Range: 4.8 - 5.6 % 16.0 (H) 15.4 (H) 6.8 (H) 14.1 (H) 13.6 (H)   Diabetes history: DM 2 Outpatient Diabetes medications:  Lantus 25 units daily, Novolog 3 units tid with meals Current orders for Inpatient glycemic control:  Novolog 0-9 units q 4 hours Lantus 13 units daily Inpatient Diabetes Program Recommendations:    Agree with orders.  It looks like patient may need less basal insulin and the addition of Novolog meal coverage 2 units tid with meals. Please reduce Novolog correction to tid with meals.       Called and spoke with patient's daughter Chelsea Meza.  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  She accidentally left her insulin in [place] so they got a sample of insulin at Dr. Cathey Endow office of [facility].  She states that blood sugars were very high and she gave up too 50 units of [place].  By the next morning patient had mental status changes and blood sugars dropped too 30's.  Blood sugars did trend low for several hours and she also tested positive for COVID.  Daughter states that she threw the [place] away.  We discussed the difference between  [place], Lantus, and Novolog.  I told her that it appears her mother does need rapid acting insulin with meals- instead of increasing the basal due to the dangers of it being long acting.  She states that mother is independent most of the time with her care, however if she lays around or doesn't get up they know something is wrong.  She agreed to help her mother more.  We discussed that glucose goals are higher between 150-200 mg/dL, and that avoidance of low blood sugars is important.  I also told her I would call her mother to discuss further.  Patient's other daughter is currently in the hospital with a stroke on [place].        Called and also spoke with Chelsea Meza.  We discussed what led up too the low blood sugars and the difference between basal and bolus insulins.  She states that she is usually independent with her care and was so proud that she still works.  We discussed goal blood sugars, the importance of avoiding low blood sugars and need to f/u with Dr. Berdine Addison.  At d/c recommend Toujeo and Novolog Rx's using insulin pens.  She states that she does not have any further questions.   Thanks  Adah Perl, RN, BC-ADM Inpatient Diabetes Coordinator Pager [phone] (8a-5p)

## 2020-06-22 NOTE — Plan of Care (Signed)
  Problem: Nutrition Goal: Nutritional status is improving Description: Monitor and assess patient for malnutrition (ex- brittle hair, bruises, dry skin, pale skin and conjunctiva, muscle wasting, smooth red tongue, and disorientation). Collaborate with interdisciplinary team and initiate plan and interventions as ordered.  Monitor patient's weight and dietary intake as ordered or per policy. Utilize nutrition screening tool and intervene per policy. Determine patient's food preferences and provide high-protein, high-caloric foods as appropriate.  06/22/2020 0327 by Neta Mends, RN Outcome: Progressing 06/22/2020 0327 by Neta Mends, RN Outcome: Progressing   Problem: Education: Goal: Knowledge of General Education information will improve Description: Including pain rating scale, medication(s)/side effects and non-pharmacologic comfort measures Outcome: Progressing   Problem: Health Behavior/Discharge Planning: Goal: Ability to manage health-related needs will improve Outcome: Progressing   Problem: Clinical Measurements: Goal: Ability to maintain clinical measurements within normal limits will improve Outcome: Progressing Goal: Will remain free from infection Outcome: Progressing Goal: Diagnostic test results will improve Outcome: Progressing Goal: Respiratory complications will improve Outcome: Progressing Goal: Cardiovascular complication will be avoided Outcome: Progressing   Problem: Activity: Goal: Risk for activity intolerance will decrease Outcome: Progressing   Problem: Nutrition: Goal: Adequate nutrition will be maintained Outcome: Progressing   Problem: Coping: Goal: Level of anxiety will decrease Outcome: Progressing   Problem: Elimination: Goal: Will not experience complications related to bowel motility Outcome: Progressing Goal: Will not experience complications related to urinary retention Outcome: Progressing   Problem: Pain  Managment: Goal: General experience of comfort will improve Outcome: Progressing   Problem: Safety: Goal: Ability to remain free from injury will improve Outcome: Progressing   Problem: Skin Integrity: Goal: Risk for impaired skin integrity will decrease Outcome: Progressing

## 2020-06-22 NOTE — Progress Notes (Addendum)
PROGRESS NOTE                                                                                                                                                                                                             Patient Demographics:    Chelsea Meza, is a 85 y.o. female, DOB - 1936-04-01, LGX:[id number]  Outpatient Primary MD for the patient is Iona Beard, MD   Admit date - 06/21/2020   LOS - 1  Chief Complaint  Patient presents with  . Hypoglycemia  . Altered Mental Status       Brief Narrative: Patient is a 85 y.o. female with PMHx of insulin-dependent DM-2, HTN, HLD, CKD stage stage IV-presented to the hospital with acute metabolic encephalopathy in the setting of hypoglycemia.  Patient was admitted to the hospitalist service for further evaluation and treatment.  COVID-19 vaccinated status: Unvaccinated  Significant Events: 2/19>> ED visit for hyperglycemia 2/20>> ED visit for hyperglycemia 2/21>> admit to [facility] for encephalopathy due to hypoglycemia  Significant studies: None  COVID-19 medications: Remdesivir:2/22>>  Antibiotics: None  Microbiology data: 2/21 >>blood culture: No growth 2/21>> urine culture: Pending  Procedures: None  Consults: None  DVT prophylaxis: heparin injection 5,000 Units Start: 06/21/20 2200    Subjective:    Chelsea Meza today is completely awake and alert.  No shortness of breath or cough.  On room air.   Assessment  & Plan :   Acute metabolic encephalopathy: Due to hypoglycemia.  Encephalopathy has resolved-she is awake and alert this morning.  Hypothermia: Due to hypoglycemia-resolved  Hypoglycemia: Resolved.  Per history obtained from daughter over the phone-patient had 2 ED visits for hyperglycemia-has had numerous adjustments to her insulin regimen by EDMD and also from PCP.  Per history-patient took a total of 55 units of Tresiba on Sunday  along with NovoLog-and had severe hypoglycemia/encephalopathy Monday morning.   Insulin-dependent DM-2 (A1c 13.6 on 04/05/2020): Hypoglycemia has resolved-continue SSI-start low-dose Lantus-and see how she does.  Recent Labs    06/22/20 0038 06/22/20 0402 06/22/20 0719  GLUCAP 152* 128* 123*   HTN: BP on the higher side-continue hydralazine-resume Coreg and Lasix.  Follow and adjust.    HLD: Continue statin  CKD stage IV: Creatinine close to baseline-follow closely.  COVID-19 infection: Appears asymptomatic-unvaccinated-plan on Remdesivir x3 days.  Lab Results  Component Value Date  [place] (A) 06/21/2020   [name] NEGATIVE 04/04/2020   SARSCOV2NAA NOT DETECTED 10/18/2018    Non-severe (moderate) malnutrition in context of acute illness/injury  ABG:    Component Value Date/Time   HCO3 23.8 06/20/2020 1946   TCO2 25 06/20/2020 1946   ACIDBASEDEF 2.0 06/20/2020 1946   O2SAT 43.0 06/20/2020 1946    Vent Settings: N/A    Condition - Stable  Family Communication  :  Daughter-[name] updated over the phone on 2/22  Code Status :  Full Code  Diet :  Diet Order            Diet Carb Modified Fluid consistency: Thin; Room service appropriate? Yes  Diet effective now                  Disposition Plan  :   Status is: Inpatient  Remains inpatient appropriate because:Inpatient level of care appropriate due to severity of illness   Dispo: The patient is from: Home              Anticipated d/c is to: Home              Anticipated d/c date is: 1 day              Patient currently is not medically stable to d/c.   Difficult to place patient No    Barriers to discharge: Restarting insulin regimen-needs inpatient monitoring for at least 24 hours before consideration of discharge.  Antimicorbials  :    Anti-infectives (From admission, onward)   None      Inpatient Medications  Scheduled Meds: . aspirin EC  81 mg Oral Daily  .  atorvastatin  40 mg Oral QHS  . feeding supplement  237 mL Oral BID BM  . heparin  5,000 Units Subcutaneous Q8H  . hydrALAZINE  100 mg Oral TID  . [START ON 06/23/2020] influenza vaccine adjuvanted  0.5 mL Intramuscular Tomorrow-1000  . insulin aspart  0-9 Units Subcutaneous Q4H   Continuous Infusions: PRN Meds:.acetaminophen **OR** acetaminophen, albuterol, ondansetron **OR** ondansetron (ZOFRAN) IV, polyethylene glycol   Time Spent in minutes  25  See all Orders from today for further details   Oren Binet M.D on 06/22/2020 at 12:15 PM  To page go to [email or web address] - use universal password  [facility] -  Office  [phone]    Objective:   Vitals:   06/22/20 0210 06/22/20 0251 06/22/20 0414 06/22/20 0718  BP:  (!) 169/81 (!) 161/92 (!) 159/95  Pulse:  60 77 62  Resp:  16 20 18   Temp: 99 F (37.2 C) 98.5 F (36.9 C) 99.3 F (37.4 C) 98.5 F (36.9 C)  TempSrc: Oral Oral Oral Oral  SpO2:  97% 96% 98%  Weight:  67.2 kg    Height:  5\' 9"  (1.753 m)      Wt Readings from Last 3 Encounters:  06/22/20 67.2 kg  04/05/20 69.4 kg  10/22/18 86 kg     Intake/Output Summary (Last 24 hours) at 06/22/2020 1215 Last data filed at 06/22/2020 1028 Gross per 24 hour  Intake 120 ml  Output --  Net 120 ml     Physical Exam Gen Exam:Alert awake-not in any distress HEENT:atraumatic, normocephalic Chest: B/L clear to auscultation anteriorly CVS:S1S2 regular Abdomen:soft non tender, non distended Extremities:no edema Neurology: Non focal Skin: no rash   Data Review:    CBC Recent Labs  Lab 06/19/20 0855 06/20/20 1935 06/20/20 1946 06/21/20 6948 06/22/20 5462  WBC 7.9 9.7  --  9.5 11.3*  HGB 12.6 11.8* 12.6 12.6 12.1  HCT 40.0 39.0 37.0 40.8 37.3  PLT 161 158  --  188 167  MCV 88.1 89.9  --  88.7 85.7  MCH 27.8 27.2  --  27.4 27.8  MCHC 31.5 30.3  --  30.9 32.4  RDW 12.8 12.6  --  12.8 12.9  LYMPHSABS  --  2.4  --  2.1  --   MONOABS  --  0.5  --   0.3  --   EOSABS  --  0.0  --  0.0  --   BASOSABS  --  0.0  --  0.0  --     Chemistries  Recent Labs  Lab 06/19/20 0855 06/19/20 1417 06/20/20 1935 06/20/20 1946 06/21/20 0821 06/22/20 0623  NA 140 145 133* 136 141 138  K 4.5 3.4* 4.3 4.3 3.9 3.5  CL 102 115* 100  --  105 107  CO2 21* 19* 22  --  23 21*  GLUCOSE 507* 387* 653*  --  57* 113*  BUN 59* 49* 72*  --  68* 58*  CREATININE 2.99* 2.33* 3.17*  --  2.75* 2.23*  CALCIUM 9.3 7.3* 8.8*  --  9.3 8.9  AST  --   --  21  --  29  --   ALT  --   --  10  --  11  --   ALKPHOS  --   --  72  --  77  --   BILITOT  --   --  0.7  --  0.8  --    ------------------------------------------------------------------------------------------------------------------ No results for input(s): CHOL, HDL, LDLCALC, TRIG, CHOLHDL, LDLDIRECT in the last 72 hours.  Lab Results  Component Value Date   HGBA1C 13.6 (H) 04/05/2020   ------------------------------------------------------------------------------------------------------------------ No results for input(s): TSH, T4TOTAL, T3FREE, THYROIDAB in the last 72 hours.  Invalid input(s): FREET3 ------------------------------------------------------------------------------------------------------------------ No results for input(s): VITAMINB12, FOLATE, FERRITIN, TIBC, IRON, RETICCTPCT in the last 72 hours.  Coagulation profile No results for input(s): INR, PROTIME in the last 168 hours.  No results for input(s): DDIMER in the last 72 hours.  Cardiac Enzymes No results for input(s): CKMB, TROPONINI, MYOGLOBIN in the last 168 hours.  Invalid input(s): CK ------------------------------------------------------------------------------------------------------------------ No results found for: BNP  Micro Results Recent Results (from the past 240 hour(s))  Urine culture     Status: None   Collection Time: 06/21/20  9:35 AM   Specimen: Urine, Random  Result Value Ref Range Status   Specimen  Description URINE, RANDOM  Final   Special Requests NONE  Final   Culture   Final    NO GROWTH Performed at [facility], [address], [place], [place] [address]    Report Status 06/22/2020 FINAL  Final  Culture, blood (routine x 2)     Status: None (Preliminary result)   Collection Time: 06/21/20  9:47 AM   Specimen: BLOOD RIGHT HAND  Result Value Ref Range Status   Specimen Description BLOOD RIGHT HAND  Final   Special Requests   Final    BOTTLES DRAWN AEROBIC ONLY Blood Culture results may not be optimal due to an inadequate volume of blood received in culture bottles   Culture   Final    NO GROWTH < 24 HOURS Performed at [facility], [place] [address], [place], [place] [address]    Report Status PENDING  Incomplete  Culture, blood (routine x  2)     Status: None (Preliminary result)   Collection Time: 06/21/20  9:53 AM   Specimen: BLOOD  Result Value Ref Range Status   Specimen Description BLOOD LEFT ANTECUBITAL  Final   Special Requests   Final    BOTTLES DRAWN AEROBIC ONLY Blood Culture results may not be optimal due to an inadequate volume of blood received in culture bottles   Culture   Final    NO GROWTH < 24 HOURS Performed at [facility], [place] [address] [place], [place] [address]    Report Status PENDING  Incomplete  Urine culture     Status: None   Collection Time: 06/21/20 11:08 AM   Specimen: Urine, Random  Result Value Ref Range Status   Specimen Description URINE, RANDOM  Final   Special Requests NONE  Final   Culture   Final    NO GROWTH Performed at [facility], [place] [address] [place], [place] [address]    Report Status 06/22/2020 FINAL  Final  Resp Panel by RT-PCR (Flu A&B, Covid) Nasopharyngeal Swab     Status: Abnormal   Collection Time: 06/21/20  4:28 PM   Specimen: Nasopharyngeal Swab; Nasopharyngeal(NP) swabs in vial transport medium  Result Value Ref Range Status   SARS Coronavirus 2 by RT PCR POSITIVE (A) NEGATIVE  Final    Comment: RESULT CALLED TO, READ BACK BY AND VERIFIED WITH: E DIXON RN 1759 06/21/20 A BROWNING (NOTE) SARS-CoV-2 target nucleic acids are DETECTED.  The SARS-CoV-2 RNA is generally detectable in upper respiratory specimens during the acute phase of infection. Positive results are indicative of the presence of the identified virus, but do not rule out bacterial infection or co-infection with other pathogens not detected by the test. Clinical correlation with patient history and other diagnostic information is necessary to determine patient infection status. The expected result is Negative.  Fact Sheet for Patients: EntrepreneurPulse.com.au  Fact Sheet for Healthcare Providers: IncredibleEmployment.be  This test is not yet approved or cleared by the [place] FDA and  has been authorized for detection and/or diagnosis of SARS-CoV-2 by FDA under an Emergency Use Authorization (EUA).  This EUA will remain in effect (meaning this test can b e used) for the duration of  the COVID-19 declaration under Section 564(b)(1) of the Act, 21 U.S.C. section 360bbb-3(b)(1), unless the authorization is terminated or revoked sooner.     Influenza A by PCR NEGATIVE NEGATIVE Final   Influenza B by PCR NEGATIVE NEGATIVE Final    Comment: (NOTE) The Xpert Xpress SARS-CoV-2/FLU/RSV plus assay is intended as an aid in the diagnosis of influenza from Nasopharyngeal swab specimens and should not be used as a sole basis for treatment. Nasal washings and aspirates are unacceptable for Xpert Xpress SARS-CoV-2/FLU/RSV testing.  Fact Sheet for Patients: EntrepreneurPulse.com.au  Fact Sheet for Healthcare Providers: IncredibleEmployment.be  This test is not yet approved or cleared by the [place] FDA and has been authorized for detection and/or diagnosis of SARS-CoV-2 by FDA under an Emergency Use Authorization (EUA).  This EUA will remain in effect (meaning this test can be used) for the duration of the COVID-19 declaration under Section 564(b)(1) of the Act, 21 U.S.C. section 360bbb-3(b)(1), unless the authorization is terminated or revoked.  Performed at [facility], [place] [address], [place],  [address]     Radiology Reports No results found.

## 2020-06-23 ENCOUNTER — Other Ambulatory Visit: Payer: Self-pay | Admitting: Internal Medicine

## 2020-06-23 DIAGNOSIS — N183 Chronic kidney disease, stage 3 unspecified: Secondary | ICD-10-CM

## 2020-06-23 DIAGNOSIS — E440 Moderate protein-calorie malnutrition: Secondary | ICD-10-CM | POA: Insufficient documentation

## 2020-06-23 DIAGNOSIS — E162 Hypoglycemia, unspecified: Secondary | ICD-10-CM

## 2020-06-23 LAB — COMPREHENSIVE METABOLIC PANEL
ALT: 12 U/L (ref 0–44)
AST: 19 U/L (ref 15–41)
Albumin: 2.5 g/dL — ABNORMAL LOW (ref 3.5–5.0)
Alkaline Phosphatase: 71 U/L (ref 38–126)
Anion gap: 11 (ref 5–15)
BUN: 64 mg/dL — ABNORMAL HIGH (ref 8–23)
CO2: 20 mmol/L — ABNORMAL LOW (ref 22–32)
Calcium: 9 mg/dL (ref 8.9–10.3)
Chloride: 105 mmol/L (ref 98–111)
Creatinine, Ser: 2.13 mg/dL — ABNORMAL HIGH (ref 0.44–1.00)
GFR, Estimated: 23 mL/min — ABNORMAL LOW (ref >60)
Glucose, Bld: 196 mg/dL — ABNORMAL HIGH (ref 70–99)
Potassium: 3.5 mmol/L (ref 3.5–5.1)
Sodium: 136 mmol/L (ref 135–145)
Total Bilirubin: 0.4 mg/dL (ref 0.3–1.2)
Total Protein: 5.7 g/dL — ABNORMAL LOW (ref 6.5–8.1)

## 2020-06-23 LAB — HEMOGLOBIN A1C
Hgb A1c MFr Bld: 15.4 % — ABNORMAL HIGH (ref 4.8–5.6)
Mean Plasma Glucose: 395 mg/dL

## 2020-06-23 LAB — GLUCOSE, CAPILLARY
Glucose-Capillary: 200 mg/dL — ABNORMAL HIGH (ref 70–99)
Glucose-Capillary: 214 mg/dL — ABNORMAL HIGH (ref 70–99)

## 2020-06-23 MED ORDER — INSULIN GLARGINE 100 UNIT/ML SOLOSTAR PEN: 25.0000 [IU] | PEN_INJECTOR | Freq: Every day | SUBCUTANEOUS | 11 refills | Status: DC

## 2020-06-23 MED ORDER — HYDRALAZINE HCL 20 MG/ML IJ SOLN: 10.0000 mg | INTRAMUSCULAR | Status: DC | PRN

## 2020-06-23 MED ORDER — INSULIN LISPRO (1 UNIT DIAL) 100 UNIT/ML (KWIKPEN): 3.0000 [IU] | PEN_INJECTOR | Freq: Three times a day (TID) | SUBCUTANEOUS | 11 refills | Status: DC

## 2020-06-23 MED ORDER — INSULIN GLARGINE 100 UNITS/ML SOLOSTAR PEN: 25.0000 [IU] | PEN_INJECTOR | Freq: Every day | SUBCUTANEOUS | 0 refills | Status: DC

## 2020-06-23 MED ORDER — CARVEDILOL 12.5 MG PO TABS: 12.5000 mg | ORAL_TABLET | Freq: Two times a day (BID) | ORAL | Status: DC

## 2020-06-23 MED ORDER — INSULIN GLARGINE 100 UNIT/ML ~~LOC~~ SOLN
25.0000 [IU] | Freq: Every day | SUBCUTANEOUS | Status: DC
  Administered 2020-06-23: 25 [IU] via SUBCUTANEOUS
  Filled 2020-06-23: qty 0.25

## 2020-06-23 MED ORDER — CARVEDILOL 6.25 MG PO TABS: 6.2500 mg | ORAL_TABLET | Freq: Two times a day (BID) | ORAL | 0 refills | Status: DC

## 2020-06-23 MED ORDER — PEN NEEDLES 32G X 4 MM MISC: 11 refills | Status: DC

## 2020-06-23 MED FILL — PENTIPS 32G X 4 MM MISC: 32G X 4 MM | 25 days supply | Qty: 100 | Fill #0

## 2020-06-23 MED FILL — LANTUS SOLOSTAR 100 UNITS/M: 100 | 30 days supply | Qty: 9 | Fill #0

## 2020-06-23 MED FILL — CARVEDILOL 6.25 MG TABLET: 6.25 | 30 days supply | Qty: 60 | Fill #0

## 2020-06-23 NOTE — Progress Notes (Signed)
Patient was discharged home by MD order; discharged instructions review and give to patient with care notes; medications were delivered to patient's room by [facility] pharmacy; IV DIC; patient will be escorted to the car by nurse tech via wheelchair.

## 2020-06-23 NOTE — Consult Note (Signed)
   [facility] Inpatient Consult   06/23/2020  MISHEL SANS [birth year] [id number]   [facility] [ACO] Patient: Chelsea Meza  Patient screened for hospitalization with 2 ED visits in the past few days for unplanned readmission risk and  to assess for potential [facility] Management service needs for post hospital transition.  Review of patient's medical record reveals patient is for  transitioning home today.  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  Patient verbalizes her PCP is Dr. Iona Beard and she has an appointment tomorrow.  She states her daughter takes her to appointments.  Chart reveals COVID-19 positive and  Hyperglycemia with high Hgb A1C of 13.6 in December noted. Patient agrees to [place] Coordinator for complex disease management care needs.  Patient gives permission for [place] Coordinator to also speak with her daughters as needed.  Plan:  Will assign to a [place] for complex disease management as her PCP does the TOC follow up and calls.  For questions contact:   Natividad Brood, RN BSN [facility] Liaison  [phone] business mobile phone Toll free office [phone]  Fax number: [phone] [email or web address]

## 2020-06-23 NOTE — Discharge Summary (Signed)
PATIENT DETAILS Name: Chelsea Meza Age: 85 y.o. Sex: female Date of Birth: [birth year] MRN: [id number]. Admitting Physician: Allie Dimmer, MD GDJ:[id number], Berneta Sages, MD  Admit Date: 06/21/2020 Discharge date: 06/23/2020  Recommendations for Outpatient Follow-up:  1. Follow up with PCP in 1-2 weeks 2. Please obtain CMP/CBC in one week 3. PCP to continue to optimize glycemic regimen  Admitted From:  Home  Disposition: [place]: No  Equipment/Devices: None  Discharge Condition: Stable  CODE STATUS: FULL CODE  Diet recommendation:  Diet Order            Diet - low sodium heart healthy           Diet Carb Modified           Diet Carb Modified Fluid consistency: Thin; Room service appropriate? Yes  Diet effective now                  Brief Summary: Brief Narrative: Patient is a 85 y.o. female with PMHx of insulin-dependent DM-2, HTN, HLD, CKD stage stage IV-presented to the hospital with acute metabolic encephalopathy in the setting of hypoglycemia.  Patient was admitted to the hospitalist service for further evaluation and treatment.  COVID-19 vaccinated status: Unvaccinated  Significant Events: 2/19>> ED visit for hyperglycemia 2/20>> ED visit for hyperglycemia 2/21>> admit to [facility] for encephalopathy due to hypoglycemia  Significant studies: None  COVID-19 medications: Remdesivir:2/22>>  Antibiotics: None  Microbiology data: 2/21 >>blood culture: No growth 2/21>> urine culture: No growth  Procedures: None  Consults: None  Brief Hospital Course: Acute metabolic encephalopathy: Due to hypoglycemia.  Encephalopathy has resolved-she is awake and alert this morning.  Hypothermia: Due to hypoglycemia-resolved  Hypoglycemia: Resolved.  Per history obtained from daughter over the phone-patient had 2 ED visits for hyperglycemia-and has had numerous adjustments to her insulin regimen-Per history obtained-she took a total of 55  units of Tresiba on Sunday-she subsequently had severe hypoglycemia on Monday morning resulting in a hospitalization.   Insulin-dependent DM-2 (A1c 13.6 on 04/05/2020): Hypoglycemia has resolved-she was started on Lantus 13 units and SSI-no further episodes of hypoglycemia-in fact CBGs on the higher side-we will go ahead and place her back on 25 units of Lantus on discharge.  Per patient-her daughter is going to her house in [place] getting her supplies of Lantus and sliding scale insulin.    HTN: BP on the higher side-continue hydralazine, Lasix-increase Coreg to 12.5 mg twice daily on discharge.  Follow with PCP and adjust accordingly.    HLD: Continue statin  CKD stage IV: Creatinine close to baseline-follow closely.  COVID-19 infection: Appears asymptomatic-unvaccinated-received Remdesivir while she was hospitalized.  Have encouraged vaccination  COVID-19 Labs:  No results for input(s): DDIMER, FERRITIN, LDH, CRP in the last 72 hours.  Lab Results  Component Value Date   SARSCOV2NAA POSITIVE (A) 06/21/2020   [place] NEGATIVE 04/04/2020   [place] NOT DETECTED 10/18/2018    Nutrition Problem: Nutrition Problem: Moderate Malnutrition Etiology: lethargy/confusion,other (see comment),chronic illness (d/t hyper and hypoglycemic episodes from T2DM) Signs/Symptoms: moderate fat depletion,mild muscle depletion,per patient/family report,percent weight loss Percent weight loss: 3.2 % Interventions: Ensure Enlive (each supplement provides 350kcal and 20 grams of protein),MVI  Discharge Diagnoses:  Principal Problem:   Poorly controlled diabetes mellitus ([place]) Active Problems:   Essential hypertension, benign   CKD (chronic kidney disease) stage 3, GFR 30-59 ml/min ([name])   HLD (hyperlipidemia)   Diabetes mellitus due to underlying condition with diabetic nephropathy ([name])   Hypoglycemia  associated with diabetes ([place])   Malnutrition of moderate degree   Discharge  Instructions:    Person Under Monitoring Name: MCKENZI BUONOMO  Location: [address] [place] [address]   Infection Prevention Recommendations for Individuals Confirmed to have, or Being Evaluated for, 2019 Novel Coronavirus (COVID-19) Infection Who Receive Care at Home  Individuals who are confirmed to have, or are being evaluated for, COVID-19 should follow the prevention steps below until a healthcare provider or local or state health department says they can return to normal activities.  Stay home except to get medical care You should restrict activities outside your home, except for getting medical care. Do not go to work, school, or public areas, and do not use public transportation or taxis.  Call ahead before visiting your doctor Before your medical appointment, call the healthcare provider and tell them that you have, or are being evaluated for, COVID-19 infection. This will help the healthcare provider's office take steps to keep other people from getting infected. Ask your healthcare provider to call the local or state health department.  Monitor your symptoms Seek prompt medical attention if your illness is worsening (e.g., difficulty breathing). Before going to your medical appointment, call the healthcare provider and tell them that you have, or are being evaluated for, COVID-19 infection. Ask your healthcare provider to call the local or state health department.  Wear a facemask You should wear a facemask that covers your nose and mouth when you are in the same room with other people and when you visit a healthcare provider. People who live with or visit you should also wear a facemask while they are in the same room with you.  Separate yourself from other people in your home As much as possible, you should stay in a different room from other people in your home. Also, you should use a separate bathroom, if available.  Avoid sharing household  items You should not share dishes, drinking glasses, cups, eating utensils, towels, bedding, or other items with other people in your home. After using these items, you should wash them thoroughly with soap and water.  Cover your coughs and sneezes Cover your mouth and nose with a tissue when you cough or sneeze, or you can cough or sneeze into your sleeve. Throw used tissues in a lined trash can, and immediately wash your hands with soap and water for at least 20 seconds or use an alcohol-based hand rub.  Wash your [facility] your hands often and thoroughly with soap and water for at least 20 seconds. You can use an alcohol-based hand sanitizer if soap and water are not available and if your hands are not visibly dirty. Avoid touching your eyes, nose, and mouth with unwashed hands.   Prevention Steps for Caregivers and Household Members of Individuals Confirmed to have, or Being Evaluated for, COVID-19 Infection Being Cared for in the Home  If you live with, or provide care at home for, a person confirmed to have, or being evaluated for, COVID-19 infection please follow these guidelines to prevent infection:  Follow healthcare provider's instructions Make sure that you understand and can help the patient follow any healthcare provider instructions for all care.  Provide for the patient's basic needs You should help the patient with basic needs in the home and provide support for getting groceries, prescriptions, and other personal needs.  Monitor the patient's symptoms If they are getting sicker, call his or her medical provider and tell them  that the patient has, or is being evaluated for, COVID-19 infection. This will help the healthcare provider's office take steps to keep other people from getting infected. Ask the healthcare provider to call the local or state health department.  Limit the number of people who have contact with the patient  If possible, have only one  caregiver for the patient.  Other household members should stay in another home or place of residence. If this is not possible, they should stay  in another room, or be separated from the patient as much as possible. Use a separate bathroom, if available.  Restrict visitors who do not have an essential need to be in the home.  Keep older adults, very young children, and other sick people away from the patient Keep older adults, very young children, and those who have compromised immune systems or chronic health conditions away from the patient. This includes people with chronic heart, lung, or kidney conditions, diabetes, and cancer.  Ensure good ventilation Make sure that shared spaces in the home have good air flow, such as from an air conditioner or an opened window, weather permitting.  Wash your hands often  Wash your hands often and thoroughly with soap and water for at least 20 seconds. You can use an alcohol based hand sanitizer if soap and water are not available and if your hands are not visibly dirty.  Avoid touching your eyes, nose, and mouth with unwashed hands.  Use disposable paper towels to dry your hands. If not available, use dedicated cloth towels and replace them when they become wet.  Wear a facemask and gloves  Wear a disposable facemask at all times in the room and gloves when you touch or have contact with the patient's blood, body fluids, and/or secretions or excretions, such as sweat, saliva, sputum, nasal mucus, vomit, urine, or feces.  Ensure the mask fits over your nose and mouth tightly, and do not touch it during use.  Throw out disposable facemasks and gloves after using them. Do not reuse.  Wash your hands immediately after removing your facemask and gloves.  If your personal clothing becomes contaminated, carefully remove clothing and launder. Wash your hands after handling contaminated clothing.  Place all used disposable facemasks, gloves, and  other waste in a lined container before disposing them with other household waste.  Remove gloves and wash your hands immediately after handling these items.  Do not share dishes, glasses, or other household items with the patient  Avoid sharing household items. You should not share dishes, drinking glasses, cups, eating utensils, towels, bedding, or other items with a patient who is confirmed to have, or being evaluated for, COVID-19 infection.  After the person uses these items, you should wash them thoroughly with soap and water.  Wash laundry thoroughly  Immediately remove and wash clothes or bedding that have blood, body fluids, and/or secretions or excretions, such as sweat, saliva, sputum, nasal mucus, vomit, urine, or feces, on them.  Wear gloves when handling laundry from the patient.  Read and follow directions on labels of laundry or clothing items and detergent. In general, wash and dry with the warmest temperatures recommended on the label.  Clean all areas the individual has used often  Clean all touchable surfaces, such as counters, tabletops, doorknobs, bathroom fixtures, toilets, phones, keyboards, tablets, and bedside tables, every day. Also, clean any surfaces that may have blood, body fluids, and/or secretions or excretions on them.  Wear gloves when cleaning surfaces the  patient has come in contact with.  Use a diluted bleach solution (e.g., dilute bleach with 1 part bleach and 10 parts water) or a household disinfectant with a label that says EPA-registered for coronaviruses. To make a bleach solution at home, add 1 tablespoon of bleach to 1 quart (4 cups) of water. For a larger supply, add  cup of bleach to 1 gallon (16 cups) of water.  Read labels of cleaning products and follow recommendations provided on product labels. Labels contain instructions for safe and effective use of the cleaning product including precautions you should take when applying the product,  such as wearing gloves or eye protection and making sure you have good ventilation during use of the product.  Remove gloves and wash hands immediately after cleaning.  Monitor yourself for signs and symptoms of illness Caregivers and household members are considered close contacts, should monitor their health, and will be asked to limit movement outside of the home to the extent possible. Follow the monitoring steps for close contacts listed on the symptom monitoring form.   ? If you have additional questions, contact your local health department or call the epidemiologist on call at [phone] (available 24/7). ? This guidance is subject to change. For the most up-to-date guidance from CDC, please refer to their website: YouBlogs.pl    Activity:  As tolerated with Full fall precautions use walker/cane & assistance as needed  Discharge Instructions    Call MD for:   Complete by: As directed    If blood sugars are less than 80   Call MD for:  difficulty breathing, headache or visual disturbances   Complete by: As directed    Diet - low sodium heart healthy   Complete by: As directed    Diet Carb Modified   Complete by: As directed    Discharge instructions   Complete by: As directed    1.)  5 days of isolation  2.)  Keep a record of your blood sugars-and take it to your next appointment with your primary care practitioner   Follow with Primary MD  Iona Beard, MD in 1-2 weeks  Please get a complete blood count and chemistry panel checked by your Primary MD at your next visit, and again as instructed by your Primary MD.  Get Medicines reviewed and adjusted: Please take all your medications with you for your next visit with your Primary MD  Laboratory/radiological data: Please request your Primary MD to go over all hospital tests and procedure/radiological results at the follow up, please ask your Primary MD to  get all Hospital records sent to his/her office.  In some cases, they will be blood work, cultures and biopsy results pending at the time of your discharge. Please request that your primary care M.D. follows up on these results.  Also Note the following: If you experience worsening of your admission symptoms, develop shortness of breath, life threatening emergency, suicidal or homicidal thoughts you must seek medical attention immediately by calling 911 or calling your MD immediately  if symptoms less severe.  You must read complete instructions/literature along with all the possible adverse reactions/side effects for all the Medicines you take and that have been prescribed to you. Take any new Medicines after you have completely understood and accpet all the possible adverse reactions/side effects.   Do not drive when taking Pain medications or sleeping medications (Benzodaizepines)  Do not take more than prescribed Pain, Sleep and Anxiety Medications. It is not advisable to combine  anxiety,sleep and pain medications without talking with your primary care practitioner  Special Instructions: If you have smoked or chewed Tobacco  in the last 2 yrs please stop smoking, stop any regular Alcohol  and or any Recreational drug use.  Wear Seat belts while driving.  Please note: You were cared for by a hospitalist during your hospital stay. Once you are discharged, your primary care physician will handle any further medical issues. Please note that NO REFILLS for any discharge medications will be authorized once you are discharged, as it is imperative that you return to your primary care physician (or establish a relationship with a primary care physician if you do not have one) for your post hospital discharge needs so that they can reassess your need for medications and monitor your lab values.   Increase activity slowly   Complete by: As directed      Allergies as of 06/23/2020   No Known  Allergies     Medication List    STOP taking these medications   insulin aspart 100 UNIT/ML FlexPen Commonly known as: NOVOLOG Replaced by: insulin lispro 100 UNIT/ML KwikPen     TAKE these medications   aspirin EC 81 MG tablet Take 81 mg by mouth daily.   atorvastatin 40 MG tablet Commonly known as: LIPITOR Take 40 mg by mouth at bedtime.   carvedilol 12.5 MG tablet Commonly known as: COREG Take 12.5 mg by mouth daily. What changed: Another medication with the same name was removed. Continue taking this medication, and follow the directions you see here.   furosemide 40 MG tablet Commonly known as: LASIX Take 40 mg by mouth daily.   hydrALAZINE 100 MG tablet Commonly known as: APRESOLINE Take 1 tablet (100 mg total) by mouth 3 (three) times daily.   insulin glargine 100 unit/mL Sopn Commonly known as: LANTUS Inject 25 Units into the skin daily.   insulin lispro 100 UNIT/ML KwikPen Commonly known as: HumaLOG KwikPen Inject 3 Units into the skin 3 (three) times daily. Replaces: insulin aspart 100 UNIT/ML FlexPen   VITAMIN B-12 CR PO Take 1 tablet by mouth daily.   Vitamin D (Ergocalciferol) 1.25 MG (50000 UNIT) Caps capsule Commonly known as: DRISDOL Take 50,000 Units by mouth once a week.       Follow-up Information    Iona Beard, MD. Schedule an appointment as soon as possible for a visit in 1 week(s).   Specialty: Family Medicine Contact information: [address] [place] [address] [phone]        Arnoldo Lenis, MD Follow up in 2 week(s).   Specialty: Cardiology Contact information: [address] [place] [address] [phone]              No Known Allergies     Other Procedures/Studies: No results found.   TODAY-DAY OF DISCHARGE:  Subjective:   Mee Hives today has no headache,no chest abdominal pain,no new weakness tingling or numbness, feels much better wants to go home today.   Objective:    Blood pressure (!) 182/81, pulse 70, temperature 98.2 F (36.8 C), temperature source Oral, resp. rate 20, height 5\' 9"  (1.753 m), weight 67.2 kg, SpO2 97 %.  Intake/Output Summary (Last 24 hours) at 06/23/2020 1024 Last data filed at 06/23/2020 0900 Gross per 24 hour  Intake 570 ml  Output 1550 ml  Net -980 ml   Filed Weights   06/21/20 0804 06/22/20 0251  Weight: 69.4 kg 67.2 kg  Exam: Awake Alert, Oriented *3, No new F.N deficits, Normal affect [place].AT,PERRAL Supple Neck,No JVD, No cervical lymphadenopathy appriciated.  Symmetrical Chest wall movement, Good air movement bilaterally, CTAB RRR,No Gallops,Rubs or new Murmurs, No Parasternal Heave +ve B.Sounds, Abd Soft, Non tender, No organomegaly appriciated, No rebound -guarding or rigidity. No Cyanosis, Clubbing or edema, No new Rash or bruise   PERTINENT RADIOLOGIC STUDIES: No results found.   PERTINENT LAB RESULTS: CBC: Recent Labs    06/21/20 0821 06/22/20 0623  WBC 9.5 11.3*  HGB 12.6 12.1  HCT 40.8 37.3  PLT 188 167   CMET CMP     Component Value Date/Time   NA 136 06/23/2020 0051   K 3.5 06/23/2020 0051   CL 105 06/23/2020 0051   CO2 20 (L) 06/23/2020 0051   GLUCOSE 196 (H) 06/23/2020 0051   BUN 64 (H) 06/23/2020 0051   CREATININE 2.13 (H) 06/23/2020 0051   CALCIUM 9.0 06/23/2020 0051   PROT 5.7 (L) 06/23/2020 0051   ALBUMIN 2.5 (L) 06/23/2020 0051   AST 19 06/23/2020 0051   ALT 12 06/23/2020 0051   ALKPHOS 71 06/23/2020 0051   BILITOT 0.4 06/23/2020 0051   GFRNONAA 23 (L) 06/23/2020 0051   GFRAA 28 (L) 10/22/2018 0526    GFR Estimated Creatinine Clearance: 21.3 mL/min (A) (by C-G formula based on SCr of 2.13 mg/dL (H)). Recent Labs    06/20/20 1935  LIPASE 48   No results for input(s): CKTOTAL, CKMB, CKMBINDEX, TROPONINI in the last 72 hours. Invalid input(s): POCBNP No results for input(s): DDIMER in the last 72 hours. No results for input(s): HGBA1C in the last 72 hours. No  results for input(s): CHOL, HDL, LDLCALC, TRIG, CHOLHDL, LDLDIRECT in the last 72 hours. No results for input(s): TSH, T4TOTAL, T3FREE, THYROIDAB in the last 72 hours.  Invalid input(s): FREET3 No results for input(s): VITAMINB12, FOLATE, FERRITIN, TIBC, IRON, RETICCTPCT in the last 72 hours. Coags: No results for input(s): INR in the last 72 hours.  Invalid input(s): PT Microbiology: Recent Results (from the past 240 hour(s))  Urine culture     Status: None   Collection Time: 06/21/20  9:35 AM   Specimen: Urine, Random  Result Value Ref Range Status   Specimen Description URINE, RANDOM  Final   Special Requests NONE  Final   Culture   Final    NO GROWTH Performed at [facility], [address] [place], [place] [address]    Report Status 06/22/2020 FINAL  Final  Culture, blood (routine x 2)     Status: None (Preliminary result)   Collection Time: 06/21/20  9:47 AM   Specimen: BLOOD RIGHT HAND  Result Value Ref Range Status   Specimen Description BLOOD RIGHT HAND  Final   Special Requests   Final    BOTTLES DRAWN AEROBIC ONLY Blood Culture results may not be optimal due to an inadequate volume of blood received in culture bottles   Culture   Final    NO GROWTH 2 DAYS Performed at [facility], [place] [address] [place], [place] [address]    Report Status PENDING  Incomplete  Culture, blood (routine x 2)     Status: None (Preliminary result)   Collection Time: 06/21/20  9:53 AM   Specimen: BLOOD  Result Value Ref Range Status   Specimen Description BLOOD LEFT ANTECUBITAL  Final   Special Requests   Final    BOTTLES DRAWN AEROBIC ONLY Blood Culture results may not be  optimal due to an inadequate volume of blood received in culture bottles   Culture   Final    NO GROWTH 2 DAYS Performed at [facility], [place] [address], [place], [place] [address]    Report Status PENDING  Incomplete  Urine culture     Status: None   Collection Time: 06/21/20 11:08 AM    Specimen: Urine, Random  Result Value Ref Range Status   Specimen Description URINE, RANDOM  Final   Special Requests NONE  Final   Culture   Final    NO GROWTH Performed at [facility], [place] [address], [place], [place] [address]    Report Status 06/22/2020 FINAL  Final  Resp Panel by RT-PCR (Flu A&B, Covid) Nasopharyngeal Swab     Status: Abnormal   Collection Time: 06/21/20  4:28 PM   Specimen: Nasopharyngeal Swab; Nasopharyngeal(NP) swabs in vial transport medium  Result Value Ref Range Status   SARS Coronavirus 2 by RT PCR POSITIVE (A) NEGATIVE Final    Comment: RESULT CALLED TO, READ BACK BY AND VERIFIED WITH: E DIXON RN [id number] 06/21/20 A BROWNING (NOTE) SARS-CoV-2 target nucleic acids are DETECTED.  The SARS-CoV-2 RNA is generally detectable in upper respiratory specimens during the acute phase of infection. Positive results are indicative of the presence of the identified virus, but do not rule out bacterial infection or co-infection with other pathogens not detected by the test. Clinical correlation with patient history and other diagnostic information is necessary to determine patient infection status. The expected result is Negative.  Fact Sheet for Patients: EntrepreneurPulse.com.au  Fact Sheet for Healthcare Providers: IncredibleEmployment.be  This test is not yet approved or cleared by the [place] FDA and  has been authorized for detection and/or diagnosis of SARS-CoV-2 by FDA under an Emergency Use Authorization (EUA).  This EUA will remain in effect (meaning this test can b e used) for the duration of  the COVID-19 declaration under Section 564(b)(1) of the Act, 21 U.S.C. section 360bbb-3(b)(1), unless the authorization is terminated or revoked sooner.     Influenza A by PCR NEGATIVE NEGATIVE Final   Influenza B by PCR NEGATIVE NEGATIVE Final    Comment: (NOTE) The Xpert Xpress SARS-CoV-2/FLU/RSV plus assay is  intended as an aid in the diagnosis of influenza from Nasopharyngeal swab specimens and should not be used as a sole basis for treatment. Nasal washings and aspirates are unacceptable for Xpert Xpress SARS-CoV-2/FLU/RSV testing.  Fact Sheet for Patients: EntrepreneurPulse.com.au  Fact Sheet for Healthcare Providers: IncredibleEmployment.be  This test is not yet approved or cleared by the [place] FDA and has been authorized for detection and/or diagnosis of SARS-CoV-2 by FDA under an Emergency Use Authorization (EUA). This EUA will remain in effect (meaning this test can be used) for the duration of the COVID-19 declaration under Section 564(b)(1) of the Act, 21 U.S.C. section 360bbb-3(b)(1), unless the authorization is terminated or revoked.  Performed at [facility], [place] [address], [place],  [address]     FURTHER DISCHARGE INSTRUCTIONS:  Get Medicines reviewed and adjusted: Please take all your medications with you for your next visit with your Primary MD  Laboratory/radiological data: Please request your Primary MD to go over all hospital tests and procedure/radiological results at the follow up, please ask your Primary MD to get all Hospital records sent to his/her office.  In some cases, they will be blood work, cultures and biopsy results pending at the time of your discharge.  Please request that your primary care M.D. goes through all the records of your hospital data and follows up on these results.  Also Note the following: If you experience worsening of your admission symptoms, develop shortness of breath, life threatening emergency, suicidal or homicidal thoughts you must seek medical attention immediately by calling 911 or calling your MD immediately  if symptoms less severe.  You must read complete instructions/literature along with all the possible adverse reactions/side effects for all the Medicines you take  and that have been prescribed to you. Take any new Medicines after you have completely understood and accpet all the possible adverse reactions/side effects.   Do not drive when taking Pain medications or sleeping medications (Benzodaizepines)  Do not take more than prescribed Pain, Sleep and Anxiety Medications. It is not advisable to combine anxiety,sleep and pain medications without talking with your primary care practitioner  Special Instructions: If you have smoked or chewed Tobacco  in the last 2 yrs please stop smoking, stop any regular Alcohol  and or any Recreational drug use.  Wear Seat belts while driving.  Please note: You were cared for by a hospitalist during your hospital stay. Once you are discharged, your primary care physician will handle any further medical issues. Please note that NO REFILLS for any discharge medications will be authorized once you are discharged, as it is imperative that you return to your primary care physician (or establish a relationship with a primary care physician if you do not have one) for your post hospital discharge needs so that they can reassess your need for medications and monitor your lab values.  Total Time spent coordinating discharge including counseling, education and face to face time equals 35 minutes.  SignedOren Binet 06/23/2020 10:24 AM

## 2020-06-26 LAB — CULTURE, BLOOD (ROUTINE X 2)
Culture: NO GROWTH
Culture: NO GROWTH

## 2020-06-30 ENCOUNTER — Other Ambulatory Visit: Payer: Self-pay | Admitting: *Deleted

## 2020-06-30 NOTE — Patient Outreach (Signed)
[facility]) Care Management  06/30/2020  DAYRIN STALLONE 17-Oct-1935 [id number]   [facility] Unsuccessful outreach for [facility] Telephone Assessment/Screen for MDreferral  Referral Date: 06/24/20 Referral Source: [facility] hospital liaison Referral Reason: Reason for Referral: [facility] Disease Management Insurance: united healthcare medicare  Admission 06/21/20 to 5/61/53 Acute metabolic encephalopathy: & hypothermia Due to hypoglycemia- poorly controlled diabetes mellitus   Transition of care services noted to be completed by primary care MD office staff- [facility] ([facility] Dr Iona Beard Transition of Care will be completed by primary care provider office who will refer to [facility] care management if needed.  Outreach attempt #1 Outreach attempt to the listed home number  No answer. [facility] RN CM received a message indicating verizon call could not be completed related to call restriction  Plan: [facility] RN CM scheduled this patient for another call attempt within 4-7 business days Unsuccessful outreach on 06/30/20 Unable to reach you letter sent 06/30/20  [name], RN, BSN, [place] Coordinator Office number [phone] Mobile number [phone]  Main [facility] number [phone] Fax number [phone]

## 2020-07-05 ENCOUNTER — Other Ambulatory Visit: Payer: Self-pay | Admitting: *Deleted

## 2020-07-05 NOTE — Patient Outreach (Signed)
[facility]) Care Management  07/05/2020  Chelsea Meza [birth year] [id number]   [facility] incoming outreach from daughter of patient  Referral Date:06/24/20 Referral Source:[facility] hospital liaison Referral Reason:Reason for Referral: [facility] Disease Management  Insurance:united healthcare medicare/medicaid of [place]  Admission 06/21/20 to 3/30/07MAUQJ metabolic encephalopathy:& hypothermiaDue to hypoglycemia- poorly controlled diabetes mellitus  Outreach from patient daughter Chelsea Meza number [place] initially inquired if [facility] RN CM was calling about  "My sister"  Chelsea Meza was informed [facility] RN CM was outreaching related to Chelsea Meza is able to verify HIPAA ([place] and Accountability Act) identifiers Reviewed and addressed the purpose of the follow up call with the daughter  Consent: [facility] RN CM reviewed Jack with [employer]. She gave verbal consent for services.  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 Malnutrition of moderate degree 06/23/2020  . Poorly controlled diabetes mellitus ([place]) 06/21/2020  . Hypoglycemia associated with diabetes ([place]) 06/21/2020   . Hyperosmolar hyperglycemic state (HHS) ([place]) 04/05/2020  . Hypertensive urgency 04/05/2020  . Cardiac arrest ([place]) 04/05/2020  . Acute congestive heart failure ([place]) 10/18/2018  . Hyperosmolar non-ketotic state in patient with type 2 diabetes mellitus ([place]) 10/18/2018  . Transient cerebral ischemia 11/05/2017  . Hypokalemia 11/05/2017  . Hyponatremia 03/20/2015  . AKI (acute kidney injury) ([place]) 03/20/2015  . CKD (chronic kidney disease) stage 3, GFR 30-59 ml/min ([name]) 03/20/2015  . Hyperglycemia 03/20/2015  . Hyperlipidemia 03/20/2015  . Diabetic hyperosmolar non-ketotic state ([place]) 07/14/2012  . Lactic acidosis 07/14/2012  . DKA, type 2 ([place]) 07/13/2012  . Acute-on-chronic renal failure ([place]) 07/13/2012  . Paroxysmal supraventricular tachycardia ([place]) 07/13/2012  . Paraparesis ([place]) 07/13/2012  . Paresthesia of left leg 07/13/2012  . Cough 07/13/2012  . Benign essential hypertension 07/13/2012  . Type 2 diabetes mellitus with ketoacidosis without coma ([place]) 07/13/2012  . Paresthesia of skin 07/13/2012   Current Outpatient Medications on File Prior to Visit  Medication Sig Dispense Refill  . insulin glargine (LANTUS) 100 UNIT/ML Solostar Pen Inject 25 Units into the skin daily. 15 mL 11  . Insulin Pen Needle (PEN NEEDLES) 32G X 4 MM MISC Use as directed 100 each 11  . aspirin EC 81 MG tablet Take 81 mg by mouth daily.    Marland Kitchen atorvastatin (LIPITOR) 40 MG tablet Take 40 mg by mouth at bedtime.    . carvedilol (COREG) 12.5 MG tablet Take 12.5 mg by mouth daily.    . Cyanocobalamin (VITAMIN B-12 CR PO) Take 1 tablet by mouth daily.    . furosemide (LASIX) 40 MG tablet Take 40 mg by mouth daily.     . hydrALAZINE (APRESOLINE)  100 MG tablet Take 1 tablet (100 mg total) by mouth 3 (three) times daily. 90 tablet 1  . insulin lispro (HUMALOG KWIKPEN) 100 UNIT/ML KwikPen Inject 3 Units into the skin 3 (three) times daily. 15 mL 11  . Vitamin D, Ergocalciferol, (DRISDOL) 1.25 MG (50000  UNIT) CAPS capsule Take 50,000 Units by mouth once a week. (Patient not taking: Reported on 06/21/2020)     No current facility-administered medications on file prior to visit.     [facility] RN CM will outreach to daughter Chelsea Meza within the next 4-7 business days as discussed [facility] RN CM provided Chelsea Meza with [facility] RN CM office number  Chelsea Meza encouraged to return a call to [place] CM prn   Chelsea Meza L. Lavina Hamman, RN, BSN, [place] Coordinator Office number [phone] Main [facility] number [phone] Fax number [phone]

## 2020-07-05 NOTE — Patient Outreach (Signed)
[facility] [facility]) Care Management  07/05/2020  Chelsea Meza 1935-07-14 [id number]   [facility] Unsuccessful outreaches for [facility] Telephone Assessment/Screen for MDreferral  Referral Date: 06/24/20 Referral Source: [facility] hospital liaison Referral Reason: Reason for Referral: [facility] Disease Management Insurance: united healthcare medicare  Admission 06/21/20 to 1/44/31 Acute metabolic encephalopathy: & hypothermia Due to hypoglycemia- poorly controlled diabetes mellitus     Outreach attempt to the home number [phone] No answer.THN RN CM received a message indicating verizon call could not be completed related to call restriction  Outreach attempt to her daughter Chelsea Meza number [phone] Line busy  Outreach attempt to her daughter Chelsea Meza number [phone] No answer No mail box set up   Plan: [place] scheduled this patient for another call attempt within 4-7 business days Unsuccessful outreach on 06/30/20, 07/05/20 Unable to reach you letter sent 06/30/20  [name], RN, BSN, [place] Coordinator Office number [phone] Mobile number [phone]  Main [facility] number [phone] Fax number [phone]

## 2020-07-06 ENCOUNTER — Telehealth: Payer: Self-pay | Admitting: *Deleted

## 2020-07-06 ENCOUNTER — Other Ambulatory Visit: Payer: Self-pay | Admitting: *Deleted

## 2020-07-06 DIAGNOSIS — N179 Acute kidney failure, unspecified: Secondary | ICD-10-CM

## 2020-07-06 DIAGNOSIS — I10 Essential (primary) hypertension: Secondary | ICD-10-CM

## 2020-07-06 DIAGNOSIS — E1110 Type 2 diabetes mellitus with ketoacidosis without coma: Secondary | ICD-10-CM

## 2020-07-06 DIAGNOSIS — I471 Supraventricular tachycardia: Secondary | ICD-10-CM

## 2020-07-06 DIAGNOSIS — G8220 Paraplegia, unspecified: Secondary | ICD-10-CM

## 2020-07-06 NOTE — Patient Outreach (Addendum)
[facility] [facility]) Care Management  07/06/2020  Chelsea Meza 04-Feb-1936 [id number]  [facility] outreach to post hospital/complex care patient  Chelsea Meza was referred to [facility] on 06/24/20 by [facility] hospital liaison for [facility] Disease Management  Insurance:united healthcare medicare/medicaid of [place]  Admission 06/21/20 to 4/65/03TWSFK metabolic encephalopathy:& hypothermiaDue to hypoglycemia- poorly controlled diabetes mellitus  Outreach to patient's daughter [name] number [phone] She and Chelsea Terhaar are together cut completing errands Patient is able to verify HIPAA ([place] and [place]) identifiers Reviewed and addressed the purpose of the follow up call with the patient  Consent: [facility] RN CM reviewed [facility] services with patient. Patient gave verbal consent for services.    Follow up assessment [facility] RN CM reviewed with Patient and [name] that the patient was discharged from the hospital on Lispro 3 units tid vs Novolog 5 units tid and Lantus 25 mg at night  [name] has [facility] to see if the Lispro could be obtained from [facility] but was referred to [employer] reports an outreach to [employer] and was informed that Chelsea Mcgann would not be able to get her Lispro filled until 06/23/20 They live out of [place]'s medication delivery area  [name] shares that the primary care provider (PCP) is only available in the office on Mondays and Tuesdays PCP has provided insulin assistance for Lispro previously    [place] and Patient report the barrier with the home management is not having the ordered Lispro to administer as ordered   The CBG (capillary blood glucose) value this morning was 392 The patient reports eating an egg and sausage biscuit and diet sprite for dinner and Cheerios for breakfast   Housing Trying to move to [employer] near the [place]  area They are working with salvation Corporate treasurer and is on a Theatre manager is "getting rid of our house" and [place] works in [place] care guide discussed    [name] (daughter/sister) remains on ventilator and tube feedings per [employer] to [employer] [phone] Spoke with [place], placed on hold, spoke with [facility], placed on hold Transferred to pharmacy confirm call  04/08/21 lispro 3 unit tid Pens 140 day supply Need Rx for new order Thomasene Lot, Set designer,  who confirms a Rx for Lispro pens were filled in December 2021 for the patient. Beth was not aware that these pens are not at the home for home treatment  [facility] RN CM Outreach to PCP [phone]  Spoke with Bethena Roys, [place] [facility] with her and MD that pt does not have Lispro pens at the home since hospital discharge  Requested assistance with MD order to [place] for Lispro Rx to help with completion of loss medicine process  She has not come in for hospital follow up per RN Has not been seen since December 2021 Was scheduled for an MD office visit on 06/21/20 but had to be hospitalized.  THN RN CM to send pcp copies of hospital discharge summary and after visit summary sheets  [facility]N RN CM outreached to [place] to provide an update Patient is able to verify HIPAA ([place] and Accountability Act) identifiers Reviewed and addressed the purpose of the follow up call with the patient  Consent: [facility] ([place]) RN CM reviewed [facility] services with patient. Patient gave verbal consent for services. [name] shared she was on her way to work  and had to place pt in a [place] today as part of their plan to relocate from their rented [place] home. She was allowed to ventilate her feelings She was updated on the PCP hospital follow up appointment needed to assist with resolving the Lispro concern She agreed to have [facility] RN CM complete a conference call to PCP to  schedule an appointment per [name]'s schedule PCP office staff assisted to obtain to an appointment for pt to see Dr Leafy Kindle on 07/07/20 945 am [name] agrees to transport patient to this appointment She reports pt will not go to MD appointments with her nor via medicaid transportation  Housing Today relocated to a hotel in [place] "going back forth to court" with landlord  One of brother's friend to help put items in storage 07/07/20 Daughter states she feels "overwhelm" at times Allowed her to ventilate her feelings Has a small dog  encouragement provided to daughter    Care guide ordered for housing concerns  Plan [facility] RN CM will follow up with patient and [name] within the next 7-14 business days Pt encouraged to return a call to [facility] RN CM prn  Goals Addressed              This Visit's Progress     Patient Stated   .  [facility]) Find Help in My Community (pt-stated)        Timeframe:  Long-Range Goal Priority:  Medium Start Date:                     07/06/20        Expected End Date:                     08/27/20  Follow Up Date 07/09/20   - begin a notebook of services in my neighborhood or community - follow-up on any referrals for help I am given - think ahead to make sure my need does not become an emergency - have a back-up plan - make a list of family or friends that I can call     Notes: 07/06/20 daughter has relocated pt to a local hotel pending further housing options Referred to [facility] care guide for housing assistance     .  [facility]) Manage My Medicine (pt-stated)   Not on track     Timeframe:  Short-Term Goal Priority:  High Start Date:               07/05/20              Expected End Date:              08/27/20         Follow Up Date 07/09/20   _ Review 06/23/20 hospital discharge sheet with medicine listed - keep a list of all the medicines I take; vitamins and herbals too     Notes:  07/06/20 daughter has placed calls to hospital, pcp and pharmacy with attempt  to resolve Lispro concern. [facility] RN CM assisted with resolution for 07/07/20 0945 pcp appointment after reviewing the barrier with her. Will take pt to PCP appointment 07/05/20 reports CBG remain elevated in 300-400's taking Lantus and novolog       Jetaun Colbath L. Lavina Hamman, RN, BSN, [place] Coordinator Office number [phone] Main [facility] number [phone] Fax number [phone]

## 2020-07-07 ENCOUNTER — Telehealth: Payer: Self-pay | Admitting: *Deleted

## 2020-07-07 ENCOUNTER — Other Ambulatory Visit: Payer: Self-pay | Admitting: *Deleted

## 2020-07-07 NOTE — Telephone Encounter (Signed)
   Telephone encounter was:  Successful.  07/07/2020 Name: LINSEY ARTEAGA MRN: [id number] DOB: March 08, 1936  [name] is a 85 y.o. year old female who is a primary care patient of Iona Beard, MD . The community resource team was consulted for assistance with Financial Difficulties related to finding housing   Care guide performed the following interventions: Patient provided with information about care guide support team and interviewed to confirm resource needs Discussed resources to assist with housing need Follow up call placed to community resources to determine status of patients referral.  Follow Up Plan:  Care guide will follow up with patient by phone over the next day [place], Care Management  [phone] [address] , [place] [address] Email : [email or web address]

## 2020-07-07 NOTE — Telephone Encounter (Signed)
   Telephone encounter was:  Unsuccessful.  07/07/2020 Name: DANELLA PHILSON MRN: [id number] DOB: 14-Sep-1935  Unsuccessful outbound call made today to assist with:  Financial Difficulties related to moving and finding housing   Outreach Attempt:  1st Attempt   [place], Care Management  [phone] [address] , [place] [address] Email : [email or web address]

## 2020-07-07 NOTE — Patient Outreach (Signed)
[place] [facility]) Care Management  07/07/2020  Chelsea Meza [birth year] [id number]   [facility] outreach to post hospital/complex care patient  Chelsea Meza was [facility] on 06/24/20 by [facility] hospital liaison for [facility] Disease Management  Insurance:united healthcare medicare/medicaid of [place]  Admission 06/21/20 to 0/48/88BVQXI metabolic encephalopathy:& hypothermiaDue to hypoglycemia- poorly controlled diabetes mellitus  Outreach to patient's daughter Chelsea Meza number [phone] She and Chelsea Holtmeyer are together cut completing errands Patient is able to verify HIPAA ([place]) identifiers Reviewed and addressed the purpose of the follow up call with the patient  Consent: [facility]) RN CM reviewed [facility] services with patient. Patient gave verbal consent for services.    Follow up assessment [facility] RN CM received a call from Daughter Chelsea Meza  Patient is able to verify HIPAA ([place] and Accountability Act) identifiers Reviewed and addressed the purpose of the follow up call with the patient  Consent: [facility] RN CM reviewed [facility] services with patient. Patient gave verbal consent for services.  [name] called to state she was not able to take the pt to her 07/06/20 appointment as she did not have gas money related to hotel cost/housing on 07/06/20 She reports rescheduling the appointment to 07/07/20  [facility] RN CM discussed the referral to [facility] care guide for housing and financial concerns and noted an attempt of care guide to outreach to her on 07/07/20  [facility] RN CM provided [facility] care guide contact number and request she make a call to the care guide as soon as possible  She was encouraged to outreach to [place] if no contact is made today  Plan [facility] RN CM will follow up with patient and Chelsea Meza within the next 7-14 business days Pt encouraged to return a call to [place] CM  prn   Rohit Deloria L. Lavina Hamman, RN, BSN, [place] Coordinator Office number [phone] Main [facility] number [phone] Fax number [phone]

## 2020-07-08 ENCOUNTER — Telehealth: Payer: Self-pay | Admitting: *Deleted

## 2020-07-08 ENCOUNTER — Other Ambulatory Visit: Payer: Self-pay | Admitting: *Deleted

## 2020-07-08 NOTE — Patient Outreach (Signed)
[facility]) Care Management  07/08/2020  Chelsea Meza 12/06/1935 [id number]   [place] coordination- collaboration with [facility] care guide  Incoming outreach from [facility] care guide Penni Bombard to review the outreach with the patient's daughter on 07/07/20 [name], daughter is now reporting further financial issus and minimal [employer] assistance to care guide Unsure if pt has been to follow pcp appointment Care guide reviewed case with care guide supervisor   [facility] SW stat referral ordered for homelessness, financial assistance    Plan [facility] RN CM outreach to pt/daughter within 1-3 business days if daughter does not return call Goals Addressed              This Visit's Progress     Patient Stated   .  [facility]) Find Help in My Community (pt-stated)   Not on track     Timeframe:  Long-Range Goal Priority:  Medium Start Date:                     07/06/20        Expected End Date:                     08/27/20  Follow Up Date 07/09/20   - begin a notebook of services in my neighborhood or community - follow-up on any referrals for help I am given - think ahead to make sure my need does not become an emergency - have a back-up plan - make a list of family or friends that I can call     Notes: 07/08/20 referred to [place] after collaboration with [facility] care guide 07/07/20 pt not taken to 07/07/20 appointment, daughter to return a call to care guide  07/06/20 daughter has relocated pt to a local hotel pending further housing options Referred to [facility] care guide for housing assistance     .  [facility]) Manage My Medicine (pt-stated)   Not on track     Timeframe:  Short-Term Goal Priority:  High Start Date:               07/05/20              Expected End Date:              08/27/20         Follow Up Date 07/09/20   _ Review 06/23/20 hospital discharge sheet with medicine listed - keep a list of all the medicines I take; vitamins and herbals too     Notes:  07/07/20 pt not taken to  07/07/20 appointment, daughter to return a call to care guide  07/06/20 daughter has placed calls to hospital, pcp and pharmacy with attempt to resolve Lispro concern. [facility] RN CM assisted with resolution for 07/07/20 0945 pcp appointment after reviewing the barrier with her. Will take pt to PCP appointment 07/05/20 reports CBG remain elevated in 300-400's taking Lantus and novolog       Lashaun Krapf L. Lavina Hamman, RN, BSN, [place] Coordinator Office number [phone] Mobile number [phone]  Main [facility] number [phone] Fax number [phone]

## 2020-07-08 NOTE — Telephone Encounter (Signed)
   Telephone encounter was:  Successful.  07/08/2020 Name: REGANA KEMPLE MRN: [id number] DOB: 1935/09/08  [name] is a 85 y.o. year old female who is a primary care patient of Iona Beard, MD . The community resource team was consulted for assistance with Housing , Homeless  Care guide performed the following interventions: Patient provided with information about care guide support team and interviewed to confirm resource needs Discussed resources to assist with Housing and homeless.  Follow Up Plan:  No further follow up planned at this time. The patient has been provided with needed resources.  [place], Care Management  [phone] [address] , [place] [address] Email : [email or web address]

## 2020-07-08 NOTE — Patient Outreach (Signed)
[place] [facility] Meza) Care Management  07/08/2020  Chelsea Meza January 27, 1936 [id number]   Chelsea Meza Unsuccessful outreach Chelsea Meza was referred to [facility] on 06/24/20 by [facility] hospital liaison for [facility] Disease Management  Insurance:united healthcare medicare/medicaid of [place]  Admission 06/21/20 to 5/84/41NLWHK metabolic encephalopathy:& hypothermiaDue to hypoglycemia- poorly controlled diabetes mellitus   Outreach attempt to the home number  No answer. Unable to leave a voice message no mail box set up  Attempt to update pt/daugther on collaboration with [facility] care guide, [facility] SW referral and SW number to watch to answer for   Plan: [facility] RN CM scheduled this patient for another call attempt within 4-7 business days  Asier Desroches L. Lavina Hamman, RN, BSN, [place] Coordinator Office number [phone] Mobile number [phone]  Main [facility]N number [phone] Fax number [phone]   Outreach to patient's daughter Chelsea Meza number [phone] She and Chelsea Meza are together cut completing errands Patient is able to verify HIPAA ([place] and Accountability Act) identifiers Reviewed and addressed the purpose of the follow up call with the patient  Consent: [facility]) RN CM reviewed [facility] services with patient. Patient gave verbal consent for services.    Follow up assessment

## 2020-07-09 ENCOUNTER — Telehealth: Payer: Self-pay | Admitting: *Deleted

## 2020-07-09 ENCOUNTER — Other Ambulatory Visit: Payer: Self-pay | Admitting: *Deleted

## 2020-07-09 NOTE — Patient Outreach (Signed)
[place] [facility]) Care Management  07/09/2020  Chelsea Meza 1936/01/23 [id number]   CSW received urgent referral to assist pt with housing; "homelessness".  CSW was able to make contact with pt's daughter, [name], and confirmed pt's identity.  CSW introduced self, role and reason for call.  Per daughter, she and her mother(pt) had been residing together in a rental property until they were evicted; due to landlord planning to sell the property. Pt and daughter have not been able to find a rental home that they can afford as of yet and have been staying in temporary housing.  Pt's daughter shared that [place] has been helping them and she is working with their "Re-housing" assistance team to seek housing options for them. Pt and daughter have been living in [place], [place]and are looking at rental options throughout the area and surrounding counties.   Daughter works at a [employer] facility (3pm-11pm) and is able to take her mother with her on the days she works. She reports having a friend who has offered to let them stay with them beginning tomorrow so she is seeking assistance with one nights stay in the hotel for tonight.  CSW provided daughter with # to area agency resources to call to ask for emergency assistance while CSW does inquiring also.   CSW will update as progress and updates are obtained.   Eduard Clos, MSW, [place] Worker  [place] [phone]

## 2020-07-09 NOTE — Patient Outreach (Signed)
Chelsea Meza) Care Management  07/09/2020  Chelsea Meza Jan 23, 1936 [id number]  [facility] Meza incoming outreach from patient daughter Chelsea Meza was referred to [facility] on2/24/22by[facility]N Meza liaisonforTHN Disease Management  Insurance:united healthcare medicare/medicaid of [name]  Admission2/21/22 to 6/59/93TTSVX metabolic encephalopathy:& hypothermiaDue to hypoglycemia- poorly controlled diabetes mellitus    Follow up Chelsea Meza returned a call to [place] RN CM reviewed the collaboration with [facility] care guide, the [facility] [place] SW referral and call attempt to provide [facility] SW contact information on 07/08/20 She does not have to work today States they remain at the [place] ($60/night) They have food and clothes  Diabetes She reports Chelsea Meza is doing better Her CBG (capillary blood glucose) has decreased to the 200's  Chelsea Meza was able to get a sample of Lispro insulin Pt is scheduled to be seen again by Chelsea Meza on 07/12/20   [name] states she received an outreach from Chelsea Meza this morning to review needs She reports discussing checking for possible emergency funding to assist with hotel cost  [name] is following up with [facility] SW recommendations She reports she has left a message today with social services   Chelsea Meza reports her back up plan involves a co worker also assisting to find housing.  She also discusses an alternate plan. She returns to work on 07/11/19 and will take pt if needed with her if not able to obtain funds for hotel cost  Encouraged collaboration with [facility] SW  With assessment she clarifies that their housing concerns have been occurring over a year. The home lease ended, the lease cost increased and the home owner had not renewed it. The home owner per [name] offered various reasons to include giving the home to her son or selling the home.  Chelsea Meza hearings were attended. Chelsea Meza decided to leave the home this week.  Plan  Patient agrees to care plan and follow up within the next 1-3 business days Pt encouraged to return a call to [name] CM prn  Goals Addressed              This Visit's Progress     Patient Stated   .  [facility]) Find Help in My Community (pt-stated)   On track     Timeframe:  Long-Range Goal Priority:  Medium Start Date:                     07/06/20        Expected End Date:                     08/27/20  Follow Up Date 07/12/20   - begin a notebook of services in my neighborhood or community - follow-up on any referrals for help I am given - think ahead to make sure my need does not become an emergency - have a back-up plan - make a list of family or friends that I can call     Notes: 07/09/20 spoke with [facility] SW today. Has left message at [place], working with Chelsea Meza for also possible housing resource Remains at [name] 07/08/20 referred to [place] after collaboration with [facility] care guide 07/07/20 pt not taken to 07/07/20 appointment, daughter to return a call to care guide  07/06/20 daughter has relocated pt to a local hotel pending further housing options Referred to [facility] care guide for housing assistance     .  [facility]) Manage My  Medicine (pt-stated)   On track     Timeframe:  Short-Term Goal Priority:  High Start Date:               07/05/20              Expected End Date:              08/27/20         Follow Up Date 07/12/20   _ Review 06/23/20 Meza discharge sheet with medicine listed - keep a list of all the medicines I take; vitamins and herbals too     Notes:  07/09/20 has obtained a sample of lispro and to see pcp on 07/12/20 07/07/20 pt not taken to 07/07/20 appointment, daughter to return a call to care guide  07/06/20 daughter has placed calls to Meza, pcp and pharmacy with attempt to resolve Lispro concern. [facility] RN CM assisted with resolution for 07/07/20 0945 pcp appointment after reviewing the barrier with her. Will take pt to PCP appointment 07/05/20 reports CBG remain elevated in  300-400's taking Lantus and novolog        Chelsea Lizardo L. Lavina Hamman, RN, BSN, [place] Coordinator Office number [phone] [facility] [place] [facility] number [phone] Fax number [phone]

## 2020-07-09 NOTE — Patient Outreach (Signed)
[facility]) Care Management  07/09/2020  CHRISHA VOGEL 27-May-1935 [id number]   CSW spoke with pt's daughter again today who informs that her brother has paid for the hotel stay tonight. She is now "unsure" about the friend who had offered for them to come and stay there beginning on tomorrow. Daughter reports having been told in December they had to move out because of the property being sold.  She is frustrated with agencies websites "saying one thing for funding but turning [place] away". She is tired of "99 questions and getting no help". CSW assured her we are attempting to offer assistance and guidance in seeking housing options for them. CSW reminded her about the [facility] and winter shelter in [place]. CSW validated her feelings and offered support and will plan to touch base with her again Monday. Encouraged her to call/visit area churches and agencies previously discussed and offered by CSW and Care Guide.  CSW will plan to touch base Monday. Eduard Clos, MSW, [place] Worker  [place] [phone]

## 2020-07-09 NOTE — Patient Outreach (Signed)
[facility] [facility]) Care Management  07/09/2020  Chelsea Meza 09/28/1935 [id number]   [facility] Care coordination- collaboration with [facility] SW  [facility] SW outreached to [facility] RN Case information review See [facility] SW note for offered plan of care  Updated sister "Chelsea Meza" [name] information in [place] Patient agrees to care plan and follow up within the next 1-3 business days Pt encouraged to return a call to [facility] RN CM prn  [name], RN, BSN, [place] Coordinator Office number [phone] Mobile number [phone]  Main THN number [phone] Fax number [phone]

## 2020-07-10 ENCOUNTER — Other Ambulatory Visit: Payer: Self-pay

## 2020-07-10 ENCOUNTER — Emergency Department (HOSPITAL_COMMUNITY)
Admission: EM | Admit: 2020-07-10 | Discharge: 2020-07-11 | Disposition: A | Discharge status: Home / Self Care | Payer: Medicare Other | Attending: Emergency Medicine | Admitting: Emergency Medicine

## 2020-07-10 ENCOUNTER — Emergency Department (HOSPITAL_COMMUNITY): Payer: Medicare Other

## 2020-07-10 ENCOUNTER — Encounter (HOSPITAL_COMMUNITY): Payer: Self-pay | Admitting: Emergency Medicine

## 2020-07-10 DIAGNOSIS — Z794 Long term (current) use of insulin: Secondary | ICD-10-CM | POA: Insufficient documentation

## 2020-07-10 DIAGNOSIS — R000 Tachycardia, unspecified: Secondary | ICD-10-CM | POA: Insufficient documentation

## 2020-07-10 DIAGNOSIS — N183 Chronic kidney disease, stage 3 unspecified: Secondary | ICD-10-CM | POA: Diagnosis not present

## 2020-07-10 DIAGNOSIS — E1122 Type 2 diabetes mellitus with diabetic chronic kidney disease: Secondary | ICD-10-CM | POA: Insufficient documentation

## 2020-07-10 DIAGNOSIS — R0789 Other chest pain: Secondary | ICD-10-CM | POA: Insufficient documentation

## 2020-07-10 DIAGNOSIS — I517 Cardiomegaly: Secondary | ICD-10-CM | POA: Diagnosis not present

## 2020-07-10 DIAGNOSIS — Z8673 Personal history of transient ischemic attack (TIA), and cerebral infarction without residual deficits: Secondary | ICD-10-CM | POA: Insufficient documentation

## 2020-07-10 DIAGNOSIS — E1110 Type 2 diabetes mellitus with ketoacidosis without coma: Secondary | ICD-10-CM | POA: Diagnosis not present

## 2020-07-10 DIAGNOSIS — Z79899 Other long term (current) drug therapy: Secondary | ICD-10-CM | POA: Insufficient documentation

## 2020-07-10 DIAGNOSIS — R079 Chest pain, unspecified: Secondary | ICD-10-CM | POA: Diagnosis not present

## 2020-07-10 DIAGNOSIS — Z7982 Long term (current) use of aspirin: Secondary | ICD-10-CM | POA: Insufficient documentation

## 2020-07-10 DIAGNOSIS — I129 Hypertensive chronic kidney disease with stage 1 through stage 4 chronic kidney disease, or unspecified chronic kidney disease: Secondary | ICD-10-CM | POA: Diagnosis not present

## 2020-07-10 LAB — CBC WITH DIFFERENTIAL/PLATELET
Abs Immature Granulocytes: 0.03 10*3/uL (ref 0.00–0.07)
Basophils Absolute: 0 10*3/uL (ref 0.0–0.1)
Basophils Relative: 1 %
Eosinophils Absolute: 0.1 10*3/uL (ref 0.0–0.5)
Eosinophils Relative: 2 %
HCT: 33.3 % — ABNORMAL LOW (ref 36.0–46.0)
Hemoglobin: 10.2 g/dL — ABNORMAL LOW (ref 12.0–15.0)
Immature Granulocytes: 0 %
Lymphocytes Relative: 25 %
Lymphs Abs: 1.9 10*3/uL (ref 0.7–4.0)
MCH: 27.6 pg (ref 26.0–34.0)
MCHC: 30.6 g/dL (ref 30.0–36.0)
MCV: 90 fL (ref 80.0–100.0)
Monocytes Absolute: 0.6 10*3/uL (ref 0.1–1.0)
Monocytes Relative: 8 %
Neutro Abs: 4.9 10*3/uL (ref 1.7–7.7)
Neutrophils Relative %: 64 %
Platelets: 156 10*3/uL (ref 150–400)
RBC: 3.7 MIL/uL — ABNORMAL LOW (ref 3.87–5.11)
RDW: 13.6 % (ref 11.5–15.5)
WBC: 7.6 10*3/uL (ref 4.0–10.5)
nRBC: 0 % (ref 0.0–0.2)

## 2020-07-10 LAB — COMPREHENSIVE METABOLIC PANEL
ALT: 15 U/L (ref 0–44)
AST: 16 U/L (ref 15–41)
Albumin: 2.7 g/dL — ABNORMAL LOW (ref 3.5–5.0)
Alkaline Phosphatase: 71 U/L (ref 38–126)
Anion gap: 12 (ref 5–15)
BUN: 56 mg/dL — ABNORMAL HIGH (ref 8–23)
CO2: 26 mmol/L (ref 22–32)
Calcium: 8.5 mg/dL — ABNORMAL LOW (ref 8.9–10.3)
Chloride: 99 mmol/L (ref 98–111)
Creatinine, Ser: 2.72 mg/dL — ABNORMAL HIGH (ref 0.44–1.00)
GFR, Estimated: 17 mL/min — ABNORMAL LOW (ref >60)
Glucose, Bld: 344 mg/dL — ABNORMAL HIGH (ref 70–99)
Potassium: 4.7 mmol/L (ref 3.5–5.1)
Sodium: 137 mmol/L (ref 135–145)
Total Bilirubin: 0.8 mg/dL (ref 0.3–1.2)
Total Protein: 5.7 g/dL — ABNORMAL LOW (ref 6.5–8.1)

## 2020-07-10 LAB — MAGNESIUM: Magnesium: 1.9 mg/dL (ref 1.7–2.4)

## 2020-07-10 LAB — TROPONIN I (HIGH SENSITIVITY)
Troponin I (High Sensitivity): 10 ng/L (ref <18)
Troponin I (High Sensitivity): 9 ng/L (ref <18)

## 2020-07-10 LAB — CBG MONITORING, ED
Glucose-Capillary: 317 mg/dL — ABNORMAL HIGH (ref 70–99)
Glucose-Capillary: 247 mg/dL — ABNORMAL HIGH (ref 70–99)
Glucose-Capillary: 278 mg/dL — ABNORMAL HIGH (ref 70–99)

## 2020-07-10 LAB — BRAIN NATRIURETIC PEPTIDE: B Natriuretic Peptide: 748 pg/mL — ABNORMAL HIGH (ref 0.0–100.0)

## 2020-07-10 MED ORDER — SODIUM CHLORIDE 0.9 % IV BOLUS
1000.0000 mL | Freq: Once | INTRAVENOUS | Status: AC
  Administered 2020-07-10: 1000 mL via INTRAVENOUS

## 2020-07-10 MED ORDER — CARVEDILOL 3.125 MG PO TABS: 6.2500 mg | ORAL_TABLET | Freq: Two times a day (BID) | ORAL | Status: DC

## 2020-07-10 MED ORDER — CARVEDILOL 3.125 MG PO TABS
6.2500 mg | ORAL_TABLET | Freq: Two times a day (BID) | ORAL | Status: DC
  Administered 2020-07-10: 6.25 mg via ORAL
  Filled 2020-07-10: qty 2

## 2020-07-10 MED ORDER — INSULIN ASPART 100 UNIT/ML ~~LOC~~ SOLN
3.0000 [IU] | Freq: Once | SUBCUTANEOUS | Status: AC
  Administered 2020-07-10: 3 [IU] via SUBCUTANEOUS
  Filled 2020-07-10: qty 1

## 2020-07-10 NOTE — ED Provider Notes (Signed)
[place] Provider Note   CSN: [id number] Arrival date & time: 07/10/20  1238     History Chief Complaint  Patient presents with  . Chest Pain    Chelsea Meza is a 85 y.o. female.  HPI   85 year old female past medical history of HTN, HLD, DM, CKD, paroxysmal SVT presents the emergency department after an episode of chest pain.  Patient follows with Dr. Berdine Addison for cardiology.  Patient states that around 1130 this morning she had central chest heaviness that was nonradiating.  She had no associated palpitations or shortness of breath.  She states that the chest heaviness self resolved and she is currently chest pain-free.  She states she has otherwise been in her usual state of health, compliant with her medications.  No recent fever or acute illness.  Past Medical History:  Diagnosis Date  . Anemia   . CKD (chronic kidney disease) stage 3, GFR 30-59 ml/min ([name])   . Diabetes mellitus without complication ([place])   . Hyperlipidemia   . Hypertension   . TIA (transient ischemic attack)     Patient Active Problem List   Diagnosis Date Noted  . Malnutrition of moderate degree 06/23/2020  . Poorly controlled diabetes mellitus ([place]) 06/21/2020  . Hypoglycemia associated with diabetes ([place]) 06/21/2020  . Hyperosmolar hyperglycemic state (HHS) ([place]) 04/05/2020  . Hypertensive urgency 04/05/2020  . Cardiac arrest ([place]) 04/05/2020  . Acute congestive heart failure ([place]) 10/18/2018  . Hyperosmolar non-ketotic state in patient with type 2 diabetes mellitus ([place]) 10/18/2018  . Transient cerebral ischemia 11/05/2017  . Hypokalemia 11/05/2017  . Hyponatremia 03/20/2015  . AKI (acute kidney injury) ([place]) 03/20/2015  . CKD (chronic kidney disease) stage 3, GFR 30-59 ml/min ([name]) 03/20/2015  . Hyperglycemia 03/20/2015  . Hyperlipidemia 03/20/2015  . Diabetic hyperosmolar non-ketotic state ([place]) 07/14/2012  . Lactic acidosis 07/14/2012  . DKA, type 2 ([place])  07/13/2012  . Acute-on-chronic renal failure ([place]) 07/13/2012  . Paroxysmal supraventricular tachycardia ([place]) 07/13/2012  . Paraparesis ([place]) 07/13/2012  . Paresthesia of left leg 07/13/2012  . Cough 07/13/2012  . Benign essential hypertension 07/13/2012  . Type 2 diabetes mellitus with ketoacidosis without coma ([place]) 07/13/2012  . Paresthesia of skin 07/13/2012    Past Surgical History:  Procedure Laterality Date  . CHOLECYSTECTOMY       OB History   No obstetric history on file.     Family History  Problem Relation Age of Onset  . Stroke Mother   . Stroke Maternal Grandmother     Social History   Tobacco Use  . Smoking status: Never Smoker  . Smokeless tobacco: Never Used  Vaping Use  . Vaping Use: Never used  Substance Use Topics  . Alcohol use: No  . Drug use: No    Home Medications Prior to Admission medications   Medication Sig Start Date End Date Taking? Authorizing Provider  aspirin EC 81 MG tablet Take 81 mg by mouth daily.   Yes [provider]  atorvastatin (LIPITOR) 40 MG tablet Take 40 mg by mouth at bedtime. 04/02/20  Yes [provider]  carvedilol (COREG) 6.25 MG tablet Take 6.25 mg by mouth 2 (two) times daily with a meal. 05/13/20  Yes [provider]  Cyanocobalamin (VITAMIN B-12 CR PO) Take 1 tablet by mouth daily.   Yes [provider]  furosemide (LASIX) 40 MG tablet Take 40 mg by mouth daily.  03/10/20  Yes [provider]  hydrALAZINE (APRESOLINE) 100  MG tablet Take 1 tablet (100 mg total) by mouth 3 (three) times daily. 10/22/18  Yes Tat, Shanon Brow, MD  insulin glargine (LANTUS) 100 UNIT/ML Solostar Pen Inject 25 Units into the skin daily. 06/23/20  Yes Ghimire, Henreitta Leber, MD  insulin lispro (HUMALOG KWIKPEN) 100 UNIT/ML KwikPen Inject 3 Units into the skin 3 (three) times daily. 06/23/20  Yes Ghimire, Henreitta Leber, MD  ACCU-CHEK GUIDE test strip USE TO TEST TWICE DAILY.AL 07/05/20   [provider]   Insulin Pen Needle (PEN NEEDLES) 32G X 4 MM MISC Use as directed 06/23/20   Ghimire, Henreitta Leber, MD  Vitamin D, Ergocalciferol, (DRISDOL) 1.25 MG (50000 UNIT) CAPS capsule Take 50,000 Units by mouth once a week. Patient not taking: No sig reported 01/08/20   [provider]    Allergies    Patient has no known allergies.  Review of Systems   Review of Systems  Constitutional: Negative for chills and fever.  HENT: Negative for congestion.   Eyes: Negative for visual disturbance.  Respiratory: Negative for shortness of breath.   Cardiovascular: Positive for chest pain. Negative for palpitations and leg swelling.  Gastrointestinal: Negative for abdominal pain, diarrhea and vomiting.  Genitourinary: Negative for dysuria.  Skin: Negative for rash.  Neurological: Negative for headaches.    Physical Exam Updated Vital Signs BP 95/84   Pulse 83   Temp 98.6 F (37 C) (Oral)   Resp 13   Ht 5\' 9"  (1.753 m)   Wt 67.2 kg   SpO2 95%   BMI 21.88 kg/m   Physical Exam Vitals and nursing note reviewed.  Constitutional:      General: She is not in acute distress.    Appearance: Normal appearance. She is not ill-appearing or toxic-appearing.  HENT:     Head: Normocephalic.     Mouth/Throat:     Mouth: Mucous membranes are moist.  Cardiovascular:     Rate and Rhythm: Normal rate.  Pulmonary:     Effort: Pulmonary effort is normal. No respiratory distress.     Breath sounds: Examination of the right-lower field reveals decreased breath sounds. Examination of the left-lower field reveals decreased breath sounds. Decreased breath sounds present.  Abdominal:     Palpations: Abdomen is soft.     Tenderness: There is no abdominal tenderness.  Skin:    General: Skin is warm.  Neurological:     Mental Status: She is alert and oriented to person, place, and time. Mental status is at baseline.  Psychiatric:        Mood and Affect: Mood normal.     ED Results / Procedures /  Treatments   Labs (all labs ordered are listed, but only abnormal results are displayed) Labs Reviewed  CBC WITH DIFFERENTIAL/PLATELET - Abnormal; Notable for the following components:      Result Value   RBC 3.70 (*)    Hemoglobin 10.2 (*)    HCT 33.3 (*)    All other components within normal limits  COMPREHENSIVE METABOLIC PANEL - Abnormal; Notable for the following components:   Glucose, Bld 344 (*)    BUN 56 (*)    Creatinine, Ser 2.72 (*)    Calcium 8.5 (*)    Total Protein 5.7 (*)    Albumin 2.7 (*)    GFR, Estimated 17 (*)    All other components within normal limits  BRAIN NATRIURETIC PEPTIDE - Abnormal; Notable for the following components:   B Natriuretic Peptide 748.0 (*)  All other components within normal limits  CBG MONITORING, ED - Abnormal; Notable for the following components:   Glucose-Capillary 317 (*)    All other components within normal limits  MAGNESIUM  TROPONIN I (HIGH SENSITIVITY)    EKG EKG Interpretation  Date/Time:  Saturday July 10 2020 13:00:18 EST Ventricular Rate:  70 PR Interval:    QRS Duration: 98 QT Interval:  351 QTC Calculation: 379 R Axis:   -22 Text Interpretation: Atrial fibrillation Borderline left axis deviation Consider anterior infarct Nonspecific T abnormalities, lateral leads Appears sinus with sinus arrhythmia, no STEMI Confirmed by Lavenia Atlas [phone]) on 07/10/2020 1:58:48 PM   Radiology DG Chest Port 1 View  Result Date: 07/10/2020 CLINICAL DATA:  Chest pain EXAM: PORTABLE CHEST 1 VIEW COMPARISON:  November 05, 2017 FINDINGS: No edema or airspace opacity. There is cardiomegaly with pulmonary vascularity normal. Aorta is somewhat tortuous, stable. No adenopathy. No bone lesions. IMPRESSION: Cardiomegaly.  No edema or airspace opacity. Electronically Signed   By: Lowella Grip III M.D.   On: 07/10/2020 14:13    Procedures Procedures   Medications Ordered in ED Medications  sodium chloride 0.9 % bolus 1,000 mL (0  mLs Intravenous Stopped 07/10/20 1428)    ED Course  I have reviewed the triage vital signs and the nursing notes.  Pertinent labs & imaging results that were available during my care of the patient were reviewed by me and considered in my medical decision making (see chart for details).    MDM Rules/Calculators/A&P                          85 year old female presents the emergency department after an episode of chest heaviness.  On arrival she is tachycardic.  EKG shows what appears to be in SVT with a rate of 135, no STEMI.  Blood pressures with systolics in the 99I, patient was given IV hydration and she spontaneously converted to what appears to be a sinus rhythm with a rate in the 70s with PACs.  She is currently chest discomfort free.  Blood work thus far shows a baseline AKI, mild hypocalcemia.  Chest x-ray appears stable.  First troponin is negative.  Patient continues to have heart rates in the normal range, continues to appears sinus on the monitor.  Plan to repeat troponin and evaluate with cardiology consult.   Final Clinical Impression(s) / ED Diagnoses Final diagnoses:  None    Rx / DC Orders ED Discharge Orders    None       Lorelle Gibbs, DO 07/10/20 1553

## 2020-07-10 NOTE — ED Triage Notes (Signed)
Pt to the ED with non-radiating centralized chest pain since 1130 this morning.  She describes the pain as a little ache.

## 2020-07-10 NOTE — ED Notes (Signed)
Daughter updated on pt. Daughter able to pick up pt after work at 11 pm.

## 2020-07-10 NOTE — ED Provider Notes (Signed)
Care of the patient assumed at the change of shift. Here for chest pain episode this morning. Resolved on arrival. Initially tachycardic, narrow complex, rate around 130-140 and mildly hypotensive then. Given some IVF and BP improved, rate decreased to 70s appears to sinus arrhythmia.  Physical Exam  BP (!) 163/100   Pulse 94   Temp 98.6 F (37 C) (Oral)   Resp 20   Ht 5\' 9"  (1.753 m)   Wt 67.2 kg   SpO2 98%   BMI 21.88 kg/m   Physical Exam  ED Course/Procedures     Procedures  MDM  She is on carvedilol, she sees Dr. Berdine Addison in [facility] for PCP, but does not follow with a cardiologist. She is symptom free now, Trop is neg x 2. Will discuss medication adjustments with Cardiology on call.   5:32 PM Spoke with Dr. Ailene Ravel, on call for Cardiology, who has reviewed the patient's records and her EKGs from this ED visit and does not recommend any additional medications changes. He suggests that the patient follow up in the cardiology clinic to establish care and to be considered for a loop recorder. Patient indicates her daughter will not be off work until [employer], so she will need to stay in the ED until she can be discharged home. Monitor in the meantime.        Truddie Hidden, MD 07/10/20 [phone]

## 2020-07-10 NOTE — ED Notes (Addendum)
Patient denies pain and is resting comfortably. Reported daughter noticed earlier she was breathing fast.

## 2020-07-11 ENCOUNTER — Telehealth: Payer: Self-pay | Admitting: Student in an Organized Health Care Education/Training Program

## 2020-07-11 NOTE — Telephone Encounter (Signed)
Paged by Karen Chafe to discuss patient with ED provider. Presented with chest pain 03/12 AM and found to be in SVT 130-140. After IVF BP improved and SVT converted to sinus arrhythmia. She is on coreg 6.25 mg PO bid. She does not follow with cardiology. I asked if he would like me to coordinate cardiology f/u if she is planned for discharge. He said he would reach back out if that was needed but her daughter may already have someone they wanted to f/u with. Patient discharged home per chart review. No f/u requested.

## 2020-07-12 ENCOUNTER — Telehealth: Payer: Self-pay | Admitting: *Deleted

## 2020-07-12 ENCOUNTER — Other Ambulatory Visit: Payer: Self-pay | Admitting: *Deleted

## 2020-07-12 ENCOUNTER — Other Ambulatory Visit: Payer: Self-pay

## 2020-07-12 DIAGNOSIS — N184 Chronic kidney disease, stage 4 (severe): Secondary | ICD-10-CM | POA: Diagnosis not present

## 2020-07-12 DIAGNOSIS — I10 Essential (primary) hypertension: Secondary | ICD-10-CM | POA: Diagnosis not present

## 2020-07-12 DIAGNOSIS — Z0000 Encounter for general adult medical examination without abnormal findings: Secondary | ICD-10-CM | POA: Diagnosis not present

## 2020-07-12 DIAGNOSIS — Z7189 Other specified counseling: Secondary | ICD-10-CM | POA: Diagnosis not present

## 2020-07-12 DIAGNOSIS — R079 Chest pain, unspecified: Secondary | ICD-10-CM | POA: Diagnosis not present

## 2020-07-12 DIAGNOSIS — D631 Anemia in chronic kidney disease: Secondary | ICD-10-CM | POA: Diagnosis not present

## 2020-07-12 DIAGNOSIS — E1122 Type 2 diabetes mellitus with diabetic chronic kidney disease: Secondary | ICD-10-CM | POA: Diagnosis not present

## 2020-07-12 DIAGNOSIS — E1169 Type 2 diabetes mellitus with other specified complication: Secondary | ICD-10-CM | POA: Diagnosis not present

## 2020-07-12 NOTE — Patient Outreach (Signed)
[facility] [facility]) Care Management  07/12/2020  Chelsea Meza 1935-08-28 [id number]   CSW attempted to reach pt and daughter today by phone and was unable. Daughter's voicemail not set up. Noted pt was in ED over weekend.  CSW will attempt a follow up call again later today if no return call is received.   Eduard Clos, MSW, [place] Worker  [place] [phone]

## 2020-07-12 NOTE — Patient Outreach (Signed)
[place] [facility]) Care Management  07/12/2020  YANISA GOODGAME [birth year] [id number]   CSW made contact with pt's daughter who reports "we slept in the car" the last 2 nights. Her mother is currently spending time with pt's daughter at [facility] where she was moved over the weekend. "She can stay there with her while I'm at work 3-11".  Pt's daughter currently has no means to pay for a motel room tonight, but tomorrow after she gets paid- she says she can cover about 2 weeks at the motel.  Her mother's Social Security check will also be coming at the beginning of the month (about 2 weeks) and she is hoping to find permanent housing soon.  "I've got people looking for [place] but there's so much over-priced".  CSW reminded her to seek the assistance of the local agency's/resources provided by the Care Guide last week, as well CSW will email her some local resources too.  CSW working on emergency support for tonight and will follow up.   Eduard Clos, MSW, [place] Worker  [place] [phone]

## 2020-07-12 NOTE — Patient Outreach (Signed)
[place] [facility]) Care Management  07/12/2020  SHUNTA MCLAURIN 1935-08-14 [id number]   CSW contacted pt's daughter again to advise of no funds or options to offer for housing tonight.  Shelters are full and require arrival before she gets off work (11pm).  CSW encouraged daughter to call/go to [facility] and other agency resources tomorrow. CSW will also touch base tomorrow.   Eduard Clos, MSW, [place] Worker  [place] [phone]

## 2020-07-13 ENCOUNTER — Other Ambulatory Visit: Payer: Self-pay | Admitting: *Deleted

## 2020-07-13 ENCOUNTER — Telehealth: Payer: Self-pay | Admitting: *Deleted

## 2020-07-13 NOTE — Patient Outreach (Signed)
[place] [facility]) Care Management  07/13/2020  Chelsea Meza [birth year] [id number]  CSW spoke with pt's daughter and will write a letter on on [facility] letterhead for them to take to [facility] for housing assistance (wait list but hopeful options in the next few months). Per Anner Crete McKinney's advice, CSW also advised them to apply at [employer] and at [facility] for a Case Freight forwarder.  Daughter has already spoken with someone at [facility]) and is to let her know when/if she finds housing.  Daughter reports plans to pay up front today for motel for the next 2 weeks once she gets paid.   CSW will leave letterhead note for daughter to pickup at [facility] office today.   Eduard Clos, MSW, [place] Worker  [place] [phone]

## 2020-08-05 NOTE — Patient Outreach (Signed)
[place]) Care Management  08/05/2020  Chelsea Meza 1936/03/24 [id number]   Late entry for 3/14//22 1830   THN outreach to complex care patient  Outreach to pt's daughter to review pt status Pt doing fair  Pt visiting her daughter, "Chelsea Meza at [employer] provided Daughter voiced apprection  Plan  [facility] RN CM will follow up with pt/daughter within the next 30 business days Pt encouraged to return a call to [facility] RN CM prn   [name] L. Lavina Hamman, RN, BSN, [place] Coordinator Office number [phone] Main [facility] number [phone] Fax number [phone]

## 2020-08-06 ENCOUNTER — Other Ambulatory Visit: Payer: Self-pay

## 2020-08-06 ENCOUNTER — Other Ambulatory Visit: Payer: Self-pay | Admitting: *Deleted

## 2020-08-06 NOTE — Patient Outreach (Signed)
[facility]) Care Management  08/06/2020  Chelsea Meza 01-18-1936 [id number]  Chelsea Meza outreach for Chelsea Meza [place] Telephone Assessment/Screen for MD referral Referral Date: 06/24/20 Referral Source: Chelsea Meza Meza liaison Referral Reason: Reason for Referral: Chelsea Meza Disease Management  Insurance: united healthcare medicare  Admission 06/21/20 to 7/59/16 Acute metabolic encephalopathy: & hypothermia Due to hypoglycemia- poorly controlled diabetes mellitus   Chelsea Meza is able to verify HIPAA (Chelsea Meza) identifiers Reviewed and addressed the purpose of the follow up call with her  Consent: [facility] RN CM reviewed Chelsea Meza services with Chelsea Meza She gave verbal consent for services.   Follow up Prefers not to update Chelsea Meza address as Chelsea Meza reports that they return to get mail at [place] address Patient's daughter, Chelsea Meza, to be moved to rehab at the Chelsea Meza, Chelsea Meza - She is no longer on the ventilator but has al feeding tube, tracheostomy  Pt is doing better and is staying with her sister while her daughter works  Chelsea Meza stays with Art therapist in [place] during the week  Chelsea Meza has taken a home owner virtual class for rent to own home to eventually assist with housing Confirmed Chelsea Meza states a review of a [place] 2 studio apt side by side was offered Chelsea Meza reports the patient did not want to receive this offer as the cost would be $700 x 2 + electricity  Pt prefers to save money and get a home/apartment in which she and Chelsea Meza can stay together Pt walks with a cane but finds it difficult to go from sitting to standing on low items  Diabetes CBG  <200 last HgA1c taking insulin tid 25 units in the mornings and 29 units at intervals during day per Chelsea Meza reports a weight (wt) gain from 140 lbs to 150-160 lb now  Pt is reported by Chelsea Meza to be eating all day when  daughter works  Chelsea Meza dietitian called pt with daughter present but the outreach was not successful as pt questioned the purpose of the outreach Pt loves to eat potato,  diet coke,  zero sprite  Better realistic choices discussed like intake of any zero beverages, flavored waters  [place] discussed purchasing healthy choice meals vs ms marie Callender's meals She also will chose to offer Glucerna or premier drinks  Daughter will return a call back to the  Nutritionist as encouraged after discussion of the importance    Appointment Return to see primary care provider (PCP) next week Pcp only work Monday and Tuesday    Plan Follow up in 14-21 business days for further diabetes diet assessment  Chelsea Sommers L. Chelsea Hamman, RN, BSN, [place] Coordinator Office number [phone] Main [facility] number [phone] Fax number [phone]

## 2020-08-10 ENCOUNTER — Encounter: Payer: Self-pay | Admitting: *Deleted

## 2020-08-30 ENCOUNTER — Telehealth: Payer: Self-pay | Admitting: *Deleted

## 2020-08-30 NOTE — Patient Outreach (Signed)
[place] [facility]) Care Management  08/30/2020  KIELEE CARE 01/22/1936 [id number]   CSW attempted a follow up outreach call to pr/daughter today and was unable to reach- pt's daughter's # is no longer a working #. [place] will email daughter at email used in past to communicate and provide resources in an attempt to connect.   Eduard Clos, MSW, [place] Worker  [place] [phone]

## 2020-08-31 ENCOUNTER — Other Ambulatory Visit: Payer: Self-pay | Admitting: *Deleted

## 2020-08-31 NOTE — Patient Outreach (Signed)
[place] [facility]) Care Management  08/31/2020  ERVIN ROTHBAUER 18-Jun-1935 [id number]   [facility] Unsuccessful outreach  Referral Date:06/24/20 Referral Source:THN hospital liaison Referral Reason:Reason for Referral: [facility] Disease Management Insurance:united healthcare medicare Admission 06/21/20 to 6/15/37HKFEX metabolic encephalopathy:& hypothermiaDue to hypoglycemia- poorly controlled diabetes mellitus  Outreach attempt to preferred number listed in [place] number disconnected  Outreach attempt to the home number [phone]- number disconnected  Outreach attempt to [place] busy  Noted [facility] SW  Marcie Bal C also attempt to reach pt an daughter on 08/30/20 without success E-mail sent to listed address for daughter   Plan: [facility] RN CM scheduled this patient for another call attempt within 4-7 business days  [place] L. Lavina Hamman, RN, BSN, [place] Coordinator Office number [phone] Mobile number [phone]  Main THN number [phone] Fax number [phone]

## 2020-09-02 ENCOUNTER — Other Ambulatory Visit: Payer: Self-pay | Admitting: *Deleted

## 2020-09-02 NOTE — Patient Outreach (Signed)
[facility]) Care Management  09/02/2020  Chelsea Meza June 12, 1935 [id number]   [place] coordination- collaboration with primary care provider (PCP)   Outreach to Dr Berdine Addison office spoke with Len Attempted to get message to pt/daughter  Len notes various cancellations for pcp office visits   Discussed [facility] involvement for housing resources and disease management Reviewed and discussed all numbers in EPIC as invalid- These are the same numbers listed at the pcp office   Line at PCP office disconnected  [facility] RN CM returned the call  Spoke with Len and encouraged a note to be left to request pt/daughter outreach to [facility] staff as soon as possible if arrive to pcp office    Plan Pending possible return outreach from [employer] L. Lavina Hamman, RN, BSN, [place] Coordinator Office number [phone] Mobile number [phone]  Main THN number [phone] Fax number [phone]

## 2020-09-02 NOTE — Patient Outreach (Signed)
[facility] [facility]) Care Management  09/02/2020  Chelsea Meza [birth year] [id number]   [facility] Care coordination- collaboration with [place] with [facility] SW Pending outreach from pt or daughter after messages left by [facility] RN CM & SW Invalid numbers reached [facility] SW with possible new housing resource for pt/daughter  Left a text message for daughter    Plan Continue to attempt to reach pt/daughter  [name], RN, BSN, [place] Coordinator Office number [phone] Mobile number [phone]  Main THN number [phone] Fax number [phone]

## 2020-09-08 ENCOUNTER — Other Ambulatory Visit: Payer: Self-pay | Admitting: *Deleted

## 2020-09-08 NOTE — Patient Outreach (Addendum)
[place] [facility]) Care Management  09/08/2020  Chelsea Meza 1935-08-11 [id number]   [facility] Unsuccessful outreach  Referral Date:06/24/20 Referral Source:THN hospital liaison Referral Reason:Reason for Referral: [facility] Disease Management Insurance:united healthcare medicare Admission 06/21/20 to 1/49/70YOVZC metabolic encephalopathy:& hypothermiaDue to hypoglycemia- poorly controlled diabetes mellitus  Outreach attempt to preferred number listed in [place] number disconnected  Outreach attempt to the home number [phone]- number disconnected  Outreach attempt to [phone] and left a message with [name] at [place] for [employer], Daughter to return a call to [place]    Unsuccessful outreaches on 08/31/20, 09/08/20 Unsuccessful collaboration with [place] office on 09/02/20 Unsuccessful text and e mail on 08/31/20 & 09/02/20 [facility] unsuccessful letter sent 09/08/20 Last successful outreach on 08/06/20  Plan: [place] scheduled this patient for another call attempt within 4-7 business days pending THN case closure if no response   Seeley Hissong L. Lavina Hamman, RN, BSN, [place] Coordinator Office number [phone] Mobile number [phone]  Main THN number [phone] Fax number [phone]

## 2020-09-14 ENCOUNTER — Other Ambulatory Visit: Payer: Self-pay | Admitting: *Deleted

## 2020-09-14 ENCOUNTER — Ambulatory Visit: Payer: Self-pay | Admitting: *Deleted

## 2020-09-14 NOTE — Patient Outreach (Signed)
[place] [facility] [facility]) Care Management  09/14/2020  Chelsea Meza 1936-03-09 [id number]   [facility] Unsuccessful outreach  Referral Date:06/24/20 Referral Source:THN hospital liaison Referral Reason:Reason for Referral: [facility] Disease Management Insurance:united healthcare medicare Admission 06/21/20 to 5/75/05XGZFP metabolic encephalopathy:& hypothermiaDue to hypoglycemia- poorly controlled diabetes mellitus  Outreach attempt topreferred number listed in [place] number disconnected Outreach attempt to the home [phone]- number disconnected  Outreach attempt [phone] on 09/08/20 and left a message with [name] at [place] for [employer], Daughter to return a call to [place] on 08/31/20, 09/08/20, 09/14/20 Unsuccessful collaboration with [place] office on 09/02/20 Unsuccessful text and e mail on 08/31/20 & 09/02/20 [facility] unsuccessful letter sent 09/08/20 Last successful outreach on 08/06/20  Plan: [facility] RN CM scheduled this patient for case closure in designed [facility] workflow business days pending possible response from [employer] L. Lavina Hamman, RN, BSN, [place] Coordinator Office number [phone] Mobile number [phone]  Main THN number [phone] Fax number [phone]

## 2020-09-28 ENCOUNTER — Other Ambulatory Visit: Payer: Self-pay | Admitting: *Deleted

## 2020-09-28 NOTE — Patient Outreach (Signed)
[facility]) Care Management  09/28/2020  Chelsea Meza Oct 27, 1935 [id number]   [facility] Case closure   Referral Date:06/24/20 Referral Source:THN hospital liaison Referral Reason:Reason for Referral: [facility] Disease Management Insurance:united healthcare medicare Admission 06/21/20 to 4/09/81XBJYN metabolic encephalopathy:& hypothermiaDue to hypoglycemia- poorly controlled diabetes mellitus    Unsuccessful outreaches on 08/31/20, 09/08/20, 09/14/20 Unsuccessful collaboration with [place] office on 09/02/20 Unsuccessful text and e mail on 08/31/20 & 09/02/20 [facility] unsuccessful letter sent 09/08/20 without a response   Last successful outreach on 08/06/20   Plan [facility] RN CM will close case after no response from patient within 10+ business days. Unable to maintain contact Case closure letters sent to patient and MD  Goals Addressed              This Visit's Progress     Patient Stated   .  COMPLETED: [name]) Find Help in My Community (pt-stated)        Timeframe:  Long-Range Goal Priority:  Medium Start Date:                     07/06/20        Expected End Date:                     08/27/20  Barriers: Knowledge    - begin a notebook of services in my neighborhood or community - follow-up on any referrals for help I am given - think ahead to make sure my need does not become an emergency - have a back-up plan - make a list of family or friends that I can call     Notes:  09/28/20 case closure- unable to maintain contact [name] SW updated via in basket note and letter to PCP, letter to pt  Unsuccessful outreaches on 08/31/20, 09/08/20, 09/14/20 Unsuccessful collaboration with [place] office on 09/02/20 Unsuccessful text and e mail on 08/31/20 & 09/02/20 THN unsuccessful letter sent 09/08/20 without a response  Last successful outreach on 08/06/20 09/02/20 collaboration with [facility] SW, PCP staff with attempts to outreach to pt/daughter unsuccessfully 08/31/20 unsuccessful  outreach  07/09/20 spoke with [facility] SW today. Has left message at [place], working with Art therapist for also possible housing resource Remains at [name] 07/08/20 referred to [facility] SW after collaboration with [facility] care guide 07/07/20 pt not taken to 07/07/20 appointment, daughter to return a call to care guide  07/06/20 daughter has relocated pt to a local hotel pending further housing options Referred to [facility] care guide for housing assistance     .  COMPLETED: [name]) Manage My Medicine (pt-stated)        Timeframe:  Short-Term Goal Priority:  High Start Date:               07/05/20              Expected End Date:              08/27/20         Barriers: Knowledge    _ Review 06/23/20 hospital discharge sheet with medicine listed - keep a list of all the medicines I take; vitamins and herbals too     Notes:  09/28/20 case closure- unable to maintain contact [name] SW updated via in basket note and letter to PCP, letter to pt  Unsuccessful outreaches on 08/31/20, 09/08/20, 09/14/20 Unsuccessful collaboration with [place] office on 09/02/20 Unsuccessful text and e  mail on 08/31/20 & 09/02/20 THN unsuccessful letter sent 09/08/20 without a response  Last successful outreach on 08/06/20 07/09/20 has obtained a sample of lispro and to see pcp on 07/12/20 07/07/20 pt not taken to 07/07/20 appointment, daughter to return a call to care guide  07/06/20 daughter has placed calls to hospital, pcp and pharmacy with attempt to resolve Lispro concern. [facility] RN CM assisted with resolution for 07/07/20 0945 pcp appointment after reviewing the barrier with her. Will take pt to PCP appt 07/05/20 reports cbg remain elevated in 300-400's taking Lantus and novolog      Grier Vu L. Lavina Hamman, RN, BSN, [place] Coordinator Office number [phone] Mobile number [phone]  Main THN number [phone] Fax number [phone]

## 2020-10-11 DIAGNOSIS — H524 Presbyopia: Secondary | ICD-10-CM | POA: Diagnosis not present

## 2020-10-11 DIAGNOSIS — H2513 Age-related nuclear cataract, bilateral: Secondary | ICD-10-CM | POA: Diagnosis not present

## 2020-11-09 ENCOUNTER — Telehealth: Payer: Self-pay | Admitting: *Deleted

## 2020-11-09 NOTE — Telephone Encounter (Signed)
CSW has been unable to reach pt or family and will sign off at this time.   Eduard Clos, MSW, [place] Worker  [place] [phone]

## 2021-02-07 DIAGNOSIS — N184 Chronic kidney disease, stage 4 (severe): Secondary | ICD-10-CM | POA: Diagnosis not present

## 2021-02-07 DIAGNOSIS — E1122 Type 2 diabetes mellitus with diabetic chronic kidney disease: Secondary | ICD-10-CM | POA: Diagnosis not present

## 2021-02-07 DIAGNOSIS — E1165 Type 2 diabetes mellitus with hyperglycemia: Secondary | ICD-10-CM | POA: Diagnosis not present

## 2021-02-07 DIAGNOSIS — Z794 Long term (current) use of insulin: Secondary | ICD-10-CM | POA: Diagnosis not present

## 2021-02-07 DIAGNOSIS — I10 Essential (primary) hypertension: Secondary | ICD-10-CM | POA: Diagnosis not present

## 2021-02-07 DIAGNOSIS — I129 Hypertensive chronic kidney disease with stage 1 through stage 4 chronic kidney disease, or unspecified chronic kidney disease: Secondary | ICD-10-CM | POA: Diagnosis not present

## 2021-02-07 DIAGNOSIS — Z6820 Body mass index (BMI) 20.0-20.9, adult: Secondary | ICD-10-CM | POA: Diagnosis not present

## 2021-02-07 DIAGNOSIS — Z23 Encounter for immunization: Secondary | ICD-10-CM | POA: Diagnosis not present

## 2021-02-08 DIAGNOSIS — Z794 Long term (current) use of insulin: Secondary | ICD-10-CM | POA: Diagnosis not present

## 2021-02-08 DIAGNOSIS — E1165 Type 2 diabetes mellitus with hyperglycemia: Secondary | ICD-10-CM | POA: Diagnosis not present

## 2021-02-21 ENCOUNTER — Other Ambulatory Visit: Payer: Self-pay

## 2021-02-21 ENCOUNTER — Emergency Department (HOSPITAL_COMMUNITY): Payer: Medicare Other

## 2021-02-21 ENCOUNTER — Encounter (HOSPITAL_COMMUNITY): Payer: Self-pay

## 2021-02-21 ENCOUNTER — Emergency Department (HOSPITAL_COMMUNITY)
Admission: EM | Admit: 2021-02-21 | Discharge: 2021-02-22 | Disposition: A | Discharge status: Home / Self Care | Payer: Medicare Other | Source: Home / Self Care | Attending: Emergency Medicine | Admitting: Emergency Medicine

## 2021-02-21 DIAGNOSIS — R404 Transient alteration of awareness: Secondary | ICD-10-CM | POA: Diagnosis not present

## 2021-02-21 DIAGNOSIS — N390 Urinary tract infection, site not specified: Secondary | ICD-10-CM | POA: Diagnosis not present

## 2021-02-21 DIAGNOSIS — N179 Acute kidney failure, unspecified: Secondary | ICD-10-CM | POA: Diagnosis not present

## 2021-02-21 DIAGNOSIS — R0602 Shortness of breath: Secondary | ICD-10-CM | POA: Diagnosis not present

## 2021-02-21 DIAGNOSIS — N189 Chronic kidney disease, unspecified: Secondary | ICD-10-CM | POA: Diagnosis not present

## 2021-02-21 DIAGNOSIS — L89151 Pressure ulcer of sacral region, stage 1: Secondary | ICD-10-CM | POA: Diagnosis not present

## 2021-02-21 DIAGNOSIS — Z79899 Other long term (current) drug therapy: Secondary | ICD-10-CM | POA: Insufficient documentation

## 2021-02-21 DIAGNOSIS — E1110 Type 2 diabetes mellitus with ketoacidosis without coma: Secondary | ICD-10-CM | POA: Diagnosis not present

## 2021-02-21 DIAGNOSIS — R0789 Other chest pain: Secondary | ICD-10-CM | POA: Insufficient documentation

## 2021-02-21 DIAGNOSIS — R569 Unspecified convulsions: Secondary | ICD-10-CM | POA: Diagnosis not present

## 2021-02-21 DIAGNOSIS — R531 Weakness: Secondary | ICD-10-CM | POA: Diagnosis not present

## 2021-02-21 DIAGNOSIS — Z743 Need for continuous supervision: Secondary | ICD-10-CM | POA: Diagnosis not present

## 2021-02-21 DIAGNOSIS — Z794 Long term (current) use of insulin: Secondary | ICD-10-CM | POA: Insufficient documentation

## 2021-02-21 DIAGNOSIS — Z20822 Contact with and (suspected) exposure to covid-19: Secondary | ICD-10-CM | POA: Diagnosis not present

## 2021-02-21 DIAGNOSIS — N12 Tubulo-interstitial nephritis, not specified as acute or chronic: Secondary | ICD-10-CM | POA: Insufficient documentation

## 2021-02-21 DIAGNOSIS — I130 Hypertensive heart and chronic kidney disease with heart failure and stage 1 through stage 4 chronic kidney disease, or unspecified chronic kidney disease: Secondary | ICD-10-CM | POA: Insufficient documentation

## 2021-02-21 DIAGNOSIS — E871 Hypo-osmolality and hyponatremia: Secondary | ICD-10-CM | POA: Insufficient documentation

## 2021-02-21 DIAGNOSIS — R6889 Other general symptoms and signs: Secondary | ICD-10-CM | POA: Diagnosis not present

## 2021-02-21 DIAGNOSIS — Z8673 Personal history of transient ischemic attack (TIA), and cerebral infarction without residual deficits: Secondary | ICD-10-CM | POA: Diagnosis not present

## 2021-02-21 DIAGNOSIS — R4020 Unspecified coma: Secondary | ICD-10-CM | POA: Diagnosis not present

## 2021-02-21 DIAGNOSIS — Q433 Congenital malformations of intestinal fixation: Secondary | ICD-10-CM | POA: Diagnosis not present

## 2021-02-21 DIAGNOSIS — E1100 Type 2 diabetes mellitus with hyperosmolarity without nonketotic hyperglycemic-hyperosmolar coma (NKHHC): Secondary | ICD-10-CM | POA: Diagnosis not present

## 2021-02-21 DIAGNOSIS — Q639 Congenital malformation of kidney, unspecified: Secondary | ICD-10-CM | POA: Insufficient documentation

## 2021-02-21 DIAGNOSIS — E1122 Type 2 diabetes mellitus with diabetic chronic kidney disease: Secondary | ICD-10-CM | POA: Insufficient documentation

## 2021-02-21 DIAGNOSIS — Q519 Congenital malformation of uterus and cervix, unspecified: Secondary | ICD-10-CM

## 2021-02-21 DIAGNOSIS — Z7982 Long term (current) use of aspirin: Secondary | ICD-10-CM | POA: Insufficient documentation

## 2021-02-21 DIAGNOSIS — I160 Hypertensive urgency: Secondary | ICD-10-CM | POA: Diagnosis not present

## 2021-02-21 DIAGNOSIS — I10 Essential (primary) hypertension: Secondary | ICD-10-CM | POA: Diagnosis not present

## 2021-02-21 DIAGNOSIS — N184 Chronic kidney disease, stage 4 (severe): Secondary | ICD-10-CM | POA: Diagnosis not present

## 2021-02-21 DIAGNOSIS — E782 Mixed hyperlipidemia: Secondary | ICD-10-CM | POA: Diagnosis not present

## 2021-02-21 DIAGNOSIS — N183 Chronic kidney disease, stage 3 unspecified: Secondary | ICD-10-CM | POA: Insufficient documentation

## 2021-02-21 DIAGNOSIS — K838 Other specified diseases of biliary tract: Secondary | ICD-10-CM | POA: Diagnosis not present

## 2021-02-21 DIAGNOSIS — I509 Heart failure, unspecified: Secondary | ICD-10-CM | POA: Insufficient documentation

## 2021-02-21 DIAGNOSIS — R079 Chest pain, unspecified: Secondary | ICD-10-CM | POA: Diagnosis not present

## 2021-02-21 DIAGNOSIS — K6389 Other specified diseases of intestine: Secondary | ICD-10-CM | POA: Diagnosis not present

## 2021-02-21 DIAGNOSIS — G9341 Metabolic encephalopathy: Secondary | ICD-10-CM | POA: Diagnosis not present

## 2021-02-21 DIAGNOSIS — E1165 Type 2 diabetes mellitus with hyperglycemia: Secondary | ICD-10-CM | POA: Diagnosis not present

## 2021-02-21 DIAGNOSIS — R52 Pain, unspecified: Secondary | ICD-10-CM | POA: Diagnosis not present

## 2021-02-21 DIAGNOSIS — D631 Anemia in chronic kidney disease: Secondary | ICD-10-CM | POA: Diagnosis not present

## 2021-02-21 DIAGNOSIS — I129 Hypertensive chronic kidney disease with stage 1 through stage 4 chronic kidney disease, or unspecified chronic kidney disease: Secondary | ICD-10-CM | POA: Diagnosis not present

## 2021-02-21 DIAGNOSIS — I517 Cardiomegaly: Secondary | ICD-10-CM | POA: Diagnosis not present

## 2021-02-21 DIAGNOSIS — I499 Cardiac arrhythmia, unspecified: Secondary | ICD-10-CM | POA: Diagnosis not present

## 2021-02-21 DIAGNOSIS — E872 Acidosis, unspecified: Secondary | ICD-10-CM | POA: Diagnosis not present

## 2021-02-21 DIAGNOSIS — R778 Other specified abnormalities of plasma proteins: Secondary | ICD-10-CM | POA: Diagnosis not present

## 2021-02-21 DIAGNOSIS — Z823 Family history of stroke: Secondary | ICD-10-CM | POA: Diagnosis not present

## 2021-02-21 DIAGNOSIS — R109 Unspecified abdominal pain: Secondary | ICD-10-CM | POA: Diagnosis not present

## 2021-02-21 LAB — COMPREHENSIVE METABOLIC PANEL
ALT: 21 U/L (ref 0–44)
AST: 17 U/L (ref 15–41)
Albumin: 3.5 g/dL (ref 3.5–5.0)
Alkaline Phosphatase: 105 U/L (ref 38–126)
Anion gap: 8 (ref 5–15)
BUN: 40 mg/dL — ABNORMAL HIGH (ref 8–23)
CO2: 27 mmol/L (ref 22–32)
Calcium: 8.7 mg/dL — ABNORMAL LOW (ref 8.9–10.3)
Chloride: 92 mmol/L — ABNORMAL LOW (ref 98–111)
Creatinine, Ser: 2.85 mg/dL — ABNORMAL HIGH (ref 0.44–1.00)
GFR, Estimated: 16 mL/min — ABNORMAL LOW (ref >60)
Glucose, Bld: 455 mg/dL — ABNORMAL HIGH (ref 70–99)
Potassium: 3.6 mmol/L (ref 3.5–5.1)
Sodium: 127 mmol/L — ABNORMAL LOW (ref 135–145)
Total Bilirubin: 0.7 mg/dL (ref 0.3–1.2)
Total Protein: 6.9 g/dL (ref 6.5–8.1)

## 2021-02-21 LAB — URINALYSIS, ROUTINE W REFLEX MICROSCOPIC
Bilirubin Urine: NEGATIVE
Glucose, UA: >=500 mg/dL — AB
Ketones, ur: NEGATIVE mg/dL
Nitrite: NEGATIVE
Protein, ur: >=300 mg/dL — AB
Specific Gravity, Urine: 1.017 (ref 1.005–1.030)
pH: 6 (ref 5.0–8.0)

## 2021-02-21 LAB — TROPONIN I (HIGH SENSITIVITY)
Troponin I (High Sensitivity): 19 ng/L — ABNORMAL HIGH (ref <18)
Troponin I (High Sensitivity): 18 ng/L — ABNORMAL HIGH (ref <18)

## 2021-02-21 LAB — CBC
HCT: 36.2 % (ref 36.0–46.0)
Hemoglobin: 12.2 g/dL (ref 12.0–15.0)
MCH: 29.2 pg (ref 26.0–34.0)
MCHC: 33.7 g/dL (ref 30.0–36.0)
MCV: 86.6 fL (ref 80.0–100.0)
Platelets: 177 10*3/uL (ref 150–400)
RBC: 4.18 MIL/uL (ref 3.87–5.11)
RDW: 12.6 % (ref 11.5–15.5)
WBC: 4.4 10*3/uL (ref 4.0–10.5)
nRBC: 0 % (ref 0.0–0.2)

## 2021-02-21 MED ORDER — FENTANYL CITRATE PF 50 MCG/ML IJ SOSY
50.0000 ug | PREFILLED_SYRINGE | Freq: Once | INTRAMUSCULAR | Status: AC
Start: 2021-02-21 — End: 2021-02-21
  Administered 2021-02-21: 50 ug via INTRAVENOUS
  Filled 2021-02-21: qty 1

## 2021-02-21 MED ORDER — CEPHALEXIN 500 MG PO CAPS: 500.0000 mg | ORAL_CAPSULE | Freq: Two times a day (BID) | ORAL | 0 refills | Status: DC

## 2021-02-21 MED ORDER — SODIUM CHLORIDE 0.9 % IV SOLN
1.0000 g | Freq: Once | INTRAVENOUS | Status: AC
  Administered 2021-02-21: 1 g via INTRAVENOUS
  Filled 2021-02-21: qty 10

## 2021-02-21 MED ORDER — CARVEDILOL 3.125 MG PO TABS
6.2500 mg | ORAL_TABLET | Freq: Once | ORAL | Status: AC
  Administered 2021-02-21: 6.25 mg via ORAL
  Filled 2021-02-21: qty 2

## 2021-02-21 NOTE — ED Notes (Signed)
Patient transported to CT 

## 2021-02-21 NOTE — ED Triage Notes (Signed)
Pt arrived via EMS. Right sided mid axillary pain that radiates to posterior back. Pain started 10/22 and has progressively gotten worse.

## 2021-02-21 NOTE — ED Provider Notes (Signed)
[facility] EMERGENCY DEPARTMENT Provider Note   CSN: [id number] Arrival date & time: 02/21/21  1804     History Chief Complaint  Patient presents with   Flank Pain    Chelsea Meza is a 85 y.o. female.  She is here with a complaint of some right lateral chest and flank pain that is been going on for the last 2 days.  No trauma.  No abdominal pain.  No urinary symptoms.  No fevers cough shortness of breath.  No nausea vomiting diarrhea.  Has tried some over-the-counter pain medicine without improvement.  She rates the pain as 8 out of 10.  The history is provided by the patient.  Flank Pain This is a new problem. The current episode started 2 days ago. The problem occurs constantly. The problem has not changed since onset.Pertinent negatives include no chest pain, no abdominal pain, no headaches and no shortness of breath. Nothing aggravates the symptoms. Nothing relieves the symptoms. She has tried acetaminophen for the symptoms. The treatment provided no relief.      Past Medical History:  Diagnosis Date   Anemia    CKD (chronic kidney disease) stage 3, GFR 30-59 ml/min ([name])    Diabetes mellitus without complication ([place])    Hyperlipidemia    Hypertension    TIA (transient ischemic attack)     Patient Active Problem List   Diagnosis Date Noted   Malnutrition of moderate degree 06/23/2020   Poorly controlled diabetes mellitus ([place]) 06/21/2020   Hypoglycemia associated with diabetes ([place]) 06/21/2020   Hyperosmolar hyperglycemic state (HHS) ([place]) 04/05/2020   Hypertensive urgency 04/05/2020   Cardiac arrest ([place]) 04/05/2020   Acute congestive heart failure ([place]) 10/18/2018   Hyperosmolar non-ketotic state in patient with type 2 diabetes mellitus ([place]) 10/18/2018   Transient cerebral ischemia 11/05/2017   Hypokalemia 11/05/2017   Hyponatremia 03/20/2015   AKI (acute kidney injury) ([place]) 03/20/2015   CKD (chronic kidney disease) stage 3, GFR 30-59 ml/min ([name])  03/20/2015   Hyperglycemia 03/20/2015   Hyperlipidemia 03/20/2015   Diabetic hyperosmolar non-ketotic state ([place]) 07/14/2012   Lactic acidosis 07/14/2012   DKA, type 2 ([place]) 07/13/2012   Acute-on-chronic renal failure ([place]) 07/13/2012   Paroxysmal supraventricular tachycardia ([place]) 07/13/2012   Paraparesis ([place]) 07/13/2012   Paresthesia of left leg 07/13/2012   Cough 07/13/2012   Benign essential hypertension 07/13/2012   Type 2 diabetes mellitus with ketoacidosis without coma ([place]) 07/13/2012   Paresthesia of skin 07/13/2012    Past Surgical History:  Procedure Laterality Date   CHOLECYSTECTOMY       OB History   No obstetric history on file.     Family History  Problem Relation Age of Onset   Stroke Mother    Stroke Maternal Grandmother     Social History   Tobacco Use   Smoking status: Never   Smokeless tobacco: Never  Vaping Use   Vaping Use: Never used  Substance Use Topics   Alcohol use: No   Drug use: No    Home Medications Prior to Admission medications   Medication Sig Start Date End Date Taking? Authorizing Provider  Accu-Chek FastClix Lancets MISC  04/09/20   [provider]  ACCU-CHEK GUIDE test strip USE TO TEST TWICE DAILY.AL 07/05/20   [provider]  aspirin EC 81 MG tablet Take 81 mg by mouth daily.    [provider]  atorvastatin (LIPITOR) 40 MG tablet Take 40 mg by mouth at bedtime. 04/02/20   [provider]  Blood Glucose Monitoring Suppl (ACCU-CHEK GUIDE) w/Device KIT USE AS DIRECTED.I 09/03/20   [provider]  carvedilol (COREG) 6.25 MG tablet Take 6.25 mg by mouth 2 (two) times daily with a meal. 05/13/20   [provider]  carvedilol (COREG) 6.25 MG tablet TAKE 1 TABLET (6.25 MG TOTAL) BY MOUTH 2 (TWO) TIMES DAILY WITH A MEAL. 06/23/20 06/23/21  Ghimire, Henreitta Leber, MD  Cyanocobalamin (VITAMIN B-12 CR PO) Take 1 tablet by mouth daily.    [provider]  furosemide (LASIX) 40 MG  tablet Take 40 mg by mouth daily.  03/10/20   [provider]  hydrALAZINE (APRESOLINE) 100 MG tablet Take 1 tablet (100 mg total) by mouth 3 (three) times daily. 10/22/18   Orson Eva, MD  insulin glargine (LANTUS) 100 UNIT/ML Solostar Pen Inject 25 Units into the skin daily. 06/23/20   Ghimire, Henreitta Leber, MD  insulin glargine (LANTUS) 100 UNIT/ML Solostar Pen INJECT 25 UNITS INTO THE SKIN DAILY. 06/23/20 06/23/21  Jonetta Osgood, MD  insulin lispro (HUMALOG KWIKPEN) 100 UNIT/ML KwikPen Inject 3 Units into the skin 3 (three) times daily. 06/23/20   Ghimire, Henreitta Leber, MD  insulin lispro (HUMALOG) 100 UNIT/ML KwikPen INJECT 3 UNITS INTO THE SKIN 3 (THREE) TIMES DAILY. 06/23/20 06/23/21  Jonetta Osgood, MD  Insulin Pen Needle (PEN NEEDLES) 32G X 4 MM MISC Use as directed 06/23/20   Jonetta Osgood, MD  Insulin Pen Needle 32G X 4 MM MISC USE AS DIRECTED 06/23/20 06/23/21  Ghimire, Henreitta Leber, MD  Vitamin D, Ergocalciferol, (DRISDOL) 1.25 MG (50000 UNIT) CAPS capsule Take 50,000 Units by mouth once a week. Patient not taking: No sig reported 01/08/20   [provider]    Allergies    Patient has no allergy information on record.  Review of Systems   Review of Systems  Constitutional:  Negative for fever.  HENT:  Negative for sore throat.   Eyes:  Negative for visual disturbance.  Respiratory:  Negative for shortness of breath.   Cardiovascular:  Negative for chest pain.  Gastrointestinal:  Negative for abdominal pain.  Genitourinary:  Positive for flank pain. Negative for dysuria.  Musculoskeletal:  Positive for back pain.  Skin:  Negative for rash.  Neurological:  Negative for headaches.   Physical Exam Updated Vital Signs BP (!) 151/109 (BP Location: Left Arm)   Pulse (!) 59   Temp 99 F (37.2 C) (Oral)   Resp 20   Ht _0  (1.753 m)   SpO2 98%   BMI 21.88 kg/m   Physical Exam Vitals and nursing note reviewed.  Constitutional:      General: She is not in  acute distress.    Appearance: Normal appearance. She is well-developed.  HENT:     Head: Normocephalic and atraumatic.  Eyes:     Conjunctiva/sclera: Conjunctivae normal.  Cardiovascular:     Rate and Rhythm: Normal rate and regular rhythm.     Heart sounds: No murmur heard. Pulmonary:     Effort: Pulmonary effort is normal. No respiratory distress.     Breath sounds: Normal breath sounds.  Abdominal:     Palpations: Abdomen is soft.     Tenderness: There is no abdominal tenderness. There is no guarding or rebound.  Musculoskeletal:        General: No deformity or signs of injury. Normal range of motion.     Cervical back: Neck supple.     Comments: She has no  midline back tenderness.  She is tender left posterior ribs into left lateral abdomen  Skin:    General: Skin is warm and dry.  Neurological:     General: No focal deficit present.     Mental Status: She is alert.    ED Results / Procedures / Treatments   Labs (all labs ordered are listed, but only abnormal results are displayed) Labs Reviewed  COMPREHENSIVE METABOLIC PANEL - Abnormal; Notable for the following components:      Result Value   Sodium 127 (*)    Chloride 92 (*)    Glucose, Bld 455 (*)    BUN 40 (*)    Creatinine, Ser 2.85 (*)    Calcium 8.7 (*)    GFR, Estimated 16 (*)    All other components within normal limits  URINALYSIS, ROUTINE W REFLEX MICROSCOPIC - Abnormal; Notable for the following components:   APPearance HAZY (*)    Glucose, UA >=500 (*)    Hgb urine dipstick SMALL (*)    Protein, ur >=300 (*)    Leukocytes,Ua TRACE (*)    Bacteria, UA RARE (*)    All other components within normal limits  TROPONIN I (HIGH SENSITIVITY) - Abnormal; Notable for the following components:   Troponin I (High Sensitivity) 19 (*)    All other components within normal limits  TROPONIN I (HIGH SENSITIVITY) - Abnormal; Notable for the following components:   Troponin I (High Sensitivity) 18 (*)    All other  components within normal limits  URINE CULTURE  CBC    EKG EKG Interpretation  Date/Time:  Monday February 21 2021 21:51:03 EDT Ventricular Rate:  89 PR Interval:  249 QRS Duration: 98 QT Interval:  362 QTC Calculation: 441 R Axis:   -27 Text Interpretation: Sinus rhythm Atrial premature complex Prolonged PR interval Left ventricular hypertrophy Anterior infarct, old rate slower than prior today Confirmed by Aletta Edouard [phone]) on 02/21/2021 9:55:10 PM  Radiology DG Chest Port 1 View  Result Date: 02/21/2021 CLINICAL DATA:  Right side chest pain EXAM: PORTABLE CHEST 1 VIEW COMPARISON:  07/10/2020 FINDINGS: Cardiomegaly. Tortuous aorta. No confluent opacities or effusions. No acute bony abnormality. No overt edema. IMPRESSION: Cardiomegaly, tortuous aorta. No active disease. Electronically Signed   By: Rolm Baptise M.D.   On: 02/21/2021 20:16   CT Renal Stone Study  Result Date: 02/21/2021 CLINICAL DATA:  Right flank pain. EXAM: CT ABDOMEN AND PELVIS WITHOUT CONTRAST TECHNIQUE: Multidetector CT imaging of the abdomen and pelvis was performed following the standard protocol without IV contrast. COMPARISON:  Renal ultrasound 04/05/2020. FINDINGS: Lower chest: There is some scarring or atelectasis in the lung bases. The heart is enlarged. There is a small pericardial effusion. Hepatobiliary: No focal liver abnormality is seen. Status post cholecystectomy. Common bile duct is enlarged measuring 1 cm. Pancreas: Unremarkable. No pancreatic ductal dilatation or surrounding inflammatory changes. Spleen: Normal in size without focal abnormality. Adrenals/Urinary Tract: Adrenal glands are unremarkable. Kidneys are normal, without renal calculi, or hydronephrosis. There are 2 rounded mildly hyperdense areas in the right kidney, indeterminate. Bladder is unremarkable. Stomach/Bowel: The colon is markedly redundant. There is gaseous distention of the sigmoid colon. No definitive transition point  visualized. No definite twisting identified. The appendix is not seen. Small bowel and stomach are within normal limits. Vascular/Lymphatic: Aortic atherosclerosis. No enlarged abdominal or pelvic lymph nodes. Reproductive: There is a catheter visualized in the uterus, indeterminate. Ovary is not well delineated. Other: No abdominal wall hernia or abnormality.  No abdominopelvic ascites. Musculoskeletal: Degenerative changes affect the spine. IMPRESSION: 1. No evidence for urinary tract calculus or obstruction. 2. Markedly redundant colon. The sigmoid colon is distended with air. No definite twisting identified he has. If there is high clinical concern for volvulus consider repeat examination with oral contrast. 3. Catheter visualized in the uterus, indeterminate. Recommend clinical correlation and follow-up. 4. Common bile duct is enlarged status post cholecystectomy. Correlate with lab values to exclude distal biliary obstruction. 5. Cardiomegaly with small pericardial effusion. 6. There are 2 rounded mildly hyperdense lesions in the right kidney, indeterminate. These may represent proteinaceous cyst. These can be further characterized with ultrasound or MRI as clinically appropriate. 7.  Aortic Atherosclerosis (ICD10-I70.0). Electronically Signed   By: Ronney Asters M.D.   On: 02/21/2021 22:58    Procedures Procedures   Medications Ordered in ED Medications  fentaNYL (SUBLIMAZE) injection 50 mcg (50 mcg Intravenous Given 02/21/21 2015)  cefTRIAXone (ROCEPHIN) 1 g in sodium chloride 0.9 % 100 mL IVPB (0 g Intravenous Stopped 02/21/21 2319)  carvedilol (COREG) tablet 6.25 mg (6.25 mg Oral Given 02/21/21 2323)    ED Course  I have reviewed the triage vital signs and the nursing notes.  Pertinent labs & imaging results that were available during my care of the patient were reviewed by me and considered in my medical decision making (see chart for details).  Clinical Course as of 02/22/21 1053  Mon  Feb 21, 2021  2001 Chest x-ray interpreted by me as cardiomegaly no clear infiltrate.  Awaiting radiology reading [MB]  2309 Patient's blood pressure was elevated here.  She said she had not taken her medicines today.  Have ordered some [MB]    Clinical Course User Index [MB] Hayden Rasmussen, MD   MDM Rules/Calculators/A&P                          This patient complains of right lateral chest pain right flank pain; this involves an extensive number of treatment Options and is a complaint that carries with it a high risk of complications and Morbidity. The differential includes musculoskeletal pain, pneumonia, pneumothorax, ACS, PE, pyelonephritis, renal colic, musculoskeletal  I ordered, reviewed and interpreted labs, which included CBC with normal white count normal hemoglobin, chemistries with low sodium stable elevated BUN/creatinine elevated glucose, urinalysis with signs of infection I ordered medication IV pain medicine, oral blood pressure medication, IV antibiotics I ordered imaging studies which included chest x-ray and CT renal and I independently    visualized and interpreted imaging which showed no acute findings.  Radiology does comment upon a redundant colon and possible catheter in uterus Previous records obtained and reviewed in epic no recent admissions  After the interventions stated above, I reevaluated the patient and found patient's pain to be adequately controlled.  Currently no indications for admission.  Will discharge on oral antibiotics.  Awaiting family for transport.  Recommended close follow-up with primary care doctor.  Return instructions discussed   Final Clinical Impression(s) / ED Diagnoses Final diagnoses:  Right flank pain  Pyelonephritis  Uterine anomaly    Rx / DC Orders ED Discharge Orders          Ordered    cephALEXin (KEFLEX) 500 MG capsule  2 times daily        02/21/21 2306             Hayden Rasmussen, MD 02/22/21 1056

## 2021-02-21 NOTE — ED Notes (Signed)
Multiple attempts to call family to pick up patient. No answers from any number

## 2021-02-21 NOTE — Discharge Instructions (Addendum)
You were seen in the emergency department for right-sided flank pain.  You had blood work CAT scan and urinalysis.  Your urine is showing signs of infection and this is possibly a cause of your pain.  You were given an IV dose of antibiotics and a prescription for a weeks worth of antibiotics.  On your CAT scan they commented upon a catheter in your uterus.  This will need to be followed up with your primary care doctor and you may end up needing to see a gynecologist.  Return to the emergency department if any worsening or concerning symptoms.

## 2021-02-22 ENCOUNTER — Other Ambulatory Visit: Payer: Self-pay

## 2021-02-22 ENCOUNTER — Encounter (HOSPITAL_COMMUNITY): Payer: Self-pay | Admitting: Emergency Medicine

## 2021-02-22 ENCOUNTER — Inpatient Hospital Stay (HOSPITAL_COMMUNITY)
Admission: EM | Admit: 2021-02-22 | Discharge: 2021-02-24 | DRG: 637 | Disposition: A | Discharge status: Home Health | Payer: Medicare Other | Attending: Family Medicine | Admitting: Family Medicine

## 2021-02-22 ENCOUNTER — Emergency Department (HOSPITAL_COMMUNITY): Payer: Medicare Other

## 2021-02-22 DIAGNOSIS — N189 Chronic kidney disease, unspecified: Secondary | ICD-10-CM

## 2021-02-22 DIAGNOSIS — E785 Hyperlipidemia, unspecified: Secondary | ICD-10-CM | POA: Diagnosis present

## 2021-02-22 DIAGNOSIS — L8990 Pressure ulcer of unspecified site, unspecified stage: Secondary | ICD-10-CM | POA: Insufficient documentation

## 2021-02-22 DIAGNOSIS — D631 Anemia in chronic kidney disease: Secondary | ICD-10-CM | POA: Diagnosis present

## 2021-02-22 DIAGNOSIS — Z79899 Other long term (current) drug therapy: Secondary | ICD-10-CM

## 2021-02-22 DIAGNOSIS — Z823 Family history of stroke: Secondary | ICD-10-CM

## 2021-02-22 DIAGNOSIS — E871 Hypo-osmolality and hyponatremia: Secondary | ICD-10-CM | POA: Diagnosis present

## 2021-02-22 DIAGNOSIS — E1165 Type 2 diabetes mellitus with hyperglycemia: Secondary | ICD-10-CM

## 2021-02-22 DIAGNOSIS — I10 Essential (primary) hypertension: Secondary | ICD-10-CM

## 2021-02-22 DIAGNOSIS — L89151 Pressure ulcer of sacral region, stage 1: Secondary | ICD-10-CM | POA: Diagnosis present

## 2021-02-22 DIAGNOSIS — E872 Acidosis, unspecified: Secondary | ICD-10-CM | POA: Diagnosis present

## 2021-02-22 DIAGNOSIS — N390 Urinary tract infection, site not specified: Secondary | ICD-10-CM

## 2021-02-22 DIAGNOSIS — N184 Chronic kidney disease, stage 4 (severe): Secondary | ICD-10-CM | POA: Diagnosis present

## 2021-02-22 DIAGNOSIS — R569 Unspecified convulsions: Secondary | ICD-10-CM

## 2021-02-22 DIAGNOSIS — G9341 Metabolic encephalopathy: Secondary | ICD-10-CM

## 2021-02-22 DIAGNOSIS — Z794 Long term (current) use of insulin: Secondary | ICD-10-CM

## 2021-02-22 DIAGNOSIS — R7989 Other specified abnormal findings of blood chemistry: Secondary | ICD-10-CM

## 2021-02-22 DIAGNOSIS — E782 Mixed hyperlipidemia: Secondary | ICD-10-CM | POA: Diagnosis present

## 2021-02-22 DIAGNOSIS — N12 Tubulo-interstitial nephritis, not specified as acute or chronic: Secondary | ICD-10-CM | POA: Diagnosis present

## 2021-02-22 DIAGNOSIS — R778 Other specified abnormalities of plasma proteins: Secondary | ICD-10-CM

## 2021-02-22 DIAGNOSIS — Z20822 Contact with and (suspected) exposure to covid-19: Secondary | ICD-10-CM | POA: Diagnosis present

## 2021-02-22 DIAGNOSIS — Z8673 Personal history of transient ischemic attack (TIA), and cerebral infarction without residual deficits: Secondary | ICD-10-CM

## 2021-02-22 DIAGNOSIS — E1110 Type 2 diabetes mellitus with ketoacidosis without coma: Principal | ICD-10-CM | POA: Diagnosis present

## 2021-02-22 DIAGNOSIS — N179 Acute kidney failure, unspecified: Secondary | ICD-10-CM | POA: Diagnosis present

## 2021-02-22 DIAGNOSIS — E1122 Type 2 diabetes mellitus with diabetic chronic kidney disease: Secondary | ICD-10-CM | POA: Diagnosis present

## 2021-02-22 DIAGNOSIS — I160 Hypertensive urgency: Secondary | ICD-10-CM | POA: Diagnosis present

## 2021-02-22 DIAGNOSIS — R202 Paresthesia of skin: Secondary | ICD-10-CM

## 2021-02-22 DIAGNOSIS — Z7982 Long term (current) use of aspirin: Secondary | ICD-10-CM

## 2021-02-22 DIAGNOSIS — E1100 Type 2 diabetes mellitus with hyperosmolarity without nonketotic hyperglycemic-hyperosmolar coma (NKHHC): Secondary | ICD-10-CM | POA: Diagnosis present

## 2021-02-22 DIAGNOSIS — I129 Hypertensive chronic kidney disease with stage 1 through stage 4 chronic kidney disease, or unspecified chronic kidney disease: Secondary | ICD-10-CM | POA: Diagnosis present

## 2021-02-22 LAB — BLOOD GAS, VENOUS
Acid-Base Excess: 0.4 mmol/L (ref 0.0–2.0)
Bicarbonate: 24.5 mmol/L (ref 20.0–28.0)
FIO2: 21
O2 Saturation: 84.2 %
Patient temperature: 37.4
pCO2, Ven: 41.2 mmHg — ABNORMAL LOW (ref 44.0–60.0)
pH, Ven: 7.396 (ref 7.250–7.430)
pO2, Ven: 55 mmHg — ABNORMAL HIGH (ref 32.0–45.0)

## 2021-02-22 LAB — CBC WITH DIFFERENTIAL/PLATELET
Abs Immature Granulocytes: 0.05 10*3/uL (ref 0.00–0.07)
Basophils Absolute: 0.1 10*3/uL (ref 0.0–0.1)
Basophils Relative: 1 %
Eosinophils Absolute: 0 10*3/uL (ref 0.0–0.5)
Eosinophils Relative: 0 %
HCT: 40.4 % (ref 36.0–46.0)
Hemoglobin: 13.1 g/dL (ref 12.0–15.0)
Immature Granulocytes: 1 %
Lymphocytes Relative: 30 %
Lymphs Abs: 3 10*3/uL (ref 0.7–4.0)
MCH: 28.9 pg (ref 26.0–34.0)
MCHC: 32.4 g/dL (ref 30.0–36.0)
MCV: 89.2 fL (ref 80.0–100.0)
Monocytes Absolute: 0.5 10*3/uL (ref 0.1–1.0)
Monocytes Relative: 5 %
Neutro Abs: 6.2 10*3/uL (ref 1.7–7.7)
Neutrophils Relative %: 63 %
Platelets: 208 10*3/uL (ref 150–400)
RBC: 4.53 MIL/uL (ref 3.87–5.11)
RDW: 12.6 % (ref 11.5–15.5)
WBC: 9.9 10*3/uL (ref 4.0–10.5)
nRBC: 0 % (ref 0.0–0.2)

## 2021-02-22 LAB — COMPREHENSIVE METABOLIC PANEL
ALT: 22 U/L (ref 0–44)
AST: 27 U/L (ref 15–41)
Albumin: 3.8 g/dL (ref 3.5–5.0)
Alkaline Phosphatase: 118 U/L (ref 38–126)
Anion gap: 19 — ABNORMAL HIGH (ref 5–15)
BUN: 51 mg/dL — ABNORMAL HIGH (ref 8–23)
CO2: 21 mmol/L — ABNORMAL LOW (ref 22–32)
Calcium: 9 mg/dL (ref 8.9–10.3)
Chloride: 88 mmol/L — ABNORMAL LOW (ref 98–111)
Creatinine, Ser: 3.23 mg/dL — ABNORMAL HIGH (ref 0.44–1.00)
GFR, Estimated: 14 mL/min — ABNORMAL LOW (ref >60)
Glucose, Bld: 753 mg/dL (ref 70–99)
Potassium: 3.6 mmol/L (ref 3.5–5.1)
Sodium: 128 mmol/L — ABNORMAL LOW (ref 135–145)
Total Bilirubin: 0.8 mg/dL (ref 0.3–1.2)
Total Protein: 7.6 g/dL (ref 6.5–8.1)

## 2021-02-22 LAB — I-STAT CHEM 8, ED
BUN: 46 mg/dL — ABNORMAL HIGH (ref 8–23)
Calcium, Ion: 1.06 mmol/L — ABNORMAL LOW (ref 1.15–1.40)
Chloride: 91 mmol/L — ABNORMAL LOW (ref 98–111)
Creatinine, Ser: 3.1 mg/dL — ABNORMAL HIGH (ref 0.44–1.00)
Glucose, Bld: >700 mg/dL (ref 70–99)
HCT: 41 % (ref 36.0–46.0)
Hemoglobin: 13.9 g/dL (ref 12.0–15.0)
Potassium: 4.1 mmol/L (ref 3.5–5.1)
Sodium: 128 mmol/L — ABNORMAL LOW (ref 135–145)
TCO2: 22 mmol/L (ref 22–32)

## 2021-02-22 LAB — CBG MONITORING, ED
Glucose-Capillary: >600 mg/dL (ref 70–99)
Glucose-Capillary: >600 mg/dL (ref 70–99)
Glucose-Capillary: >600 mg/dL (ref 70–99)

## 2021-02-22 LAB — TROPONIN I (HIGH SENSITIVITY): Troponin I (High Sensitivity): 25 ng/L — ABNORMAL HIGH (ref <18)

## 2021-02-22 LAB — LACTIC ACID, PLASMA: Lactic Acid, Venous: 7.4 mmol/L (ref 0.5–1.9)

## 2021-02-22 MED ORDER — LACTATED RINGERS IV SOLN: INTRAVENOUS | Status: DC

## 2021-02-22 MED ORDER — LEVETIRACETAM IN NACL 1000 MG/100ML IV SOLN
1000.0000 mg | INTRAVENOUS | Status: AC
Start: 2021-02-22 — End: 2021-02-22
  Administered 2021-02-22 (×2): 1000 mg via INTRAVENOUS
  Filled 2021-02-22 (×2): qty 100

## 2021-02-22 MED ORDER — SODIUM CHLORIDE 0.9 % IV BOLUS
1000.0000 mL | Freq: Once | INTRAVENOUS | Status: AC
  Administered 2021-02-22: 1000 mL via INTRAVENOUS

## 2021-02-22 MED ORDER — INSULIN REGULAR(HUMAN) IN NACL 100-0.9 UT/100ML-% IV SOLN
INTRAVENOUS | Status: AC
  Administered 2021-02-22: 8.5 [IU]/h via INTRAVENOUS
  Filled 2021-02-22: qty 100

## 2021-02-22 MED ORDER — SODIUM CHLORIDE 0.9 % IV SOLN
2000.0000 mg | Freq: Once | INTRAVENOUS | Status: DC
  Filled 2021-02-22: qty 20

## 2021-02-22 MED ORDER — DEXTROSE IN LACTATED RINGERS 5 % IV SOLN: INTRAVENOUS | Status: AC

## 2021-02-22 MED ORDER — DEXTROSE 50 % IV SOLN: 0.0000 mL | INTRAVENOUS | Status: DC | PRN

## 2021-02-22 MED ORDER — POTASSIUM CHLORIDE 10 MEQ/100ML IV SOLN
10.0000 meq | INTRAVENOUS | Status: AC
  Administered 2021-02-22 – 2021-02-23 (×2): 10 meq via INTRAVENOUS
  Filled 2021-02-22 (×2): qty 100

## 2021-02-22 NOTE — ED Triage Notes (Signed)
Per ems they were called out for unresponsiveness and recently diagnosed with uti. When pt was being registered ems states she had seizure activity. Pt is not responding at this time.

## 2021-02-22 NOTE — ED Provider Notes (Signed)
[facility] EMERGENCY DEPARTMENT Provider Note   CSN: [id number] Arrival date & time: 02/22/21  2203     History Chief Complaint  Patient presents with   Seizures    Chelsea Meza is a 85 y.o. female.  Patient with at home in the bathroom confused.  When paramedics got there she was talking to them the hallway to the emergency department.  When she was taken to triage she had a seizure.  When I arrived on the scene her seizure had stopped  The history is provided by a relative and the EMS personnel. No language interpreter was used.  Seizures Seizure activity on arrival: yes   Seizure type:  Grand mal Preceding symptoms: no sensation of an aura present   Initial focality:  Diffuse Episode characteristics: abnormal movements   Postictal symptoms: confusion   Return to baseline: no   Severity:  Moderate Timing: Probably 2 seizures. Progression:  Resolved Context: not alcohol withdrawal       Past Medical History:  Diagnosis Date   Anemia    CKD (chronic kidney disease) stage 3, GFR 30-59 ml/min ([name])    Diabetes mellitus without complication ([name])    Hyperlipidemia    Hypertension    TIA (transient ischemic attack)     Patient Active Problem List   Diagnosis Date Noted   Malnutrition of moderate degree 06/23/2020   Poorly controlled diabetes mellitus ([place]) 06/21/2020   Hypoglycemia associated with diabetes ([place]) 06/21/2020   Hyperosmolar hyperglycemic state (HHS) ([place]) 04/05/2020   Hypertensive urgency 04/05/2020   Cardiac arrest ([place]) 04/05/2020   Acute congestive heart failure ([place]) 10/18/2018   Hyperosmolar non-ketotic state in patient with type 2 diabetes mellitus ([place]) 10/18/2018   Transient cerebral ischemia 11/05/2017   Hypokalemia 11/05/2017   Hyponatremia 03/20/2015   AKI (acute kidney injury) ([place]) 03/20/2015   CKD (chronic kidney disease) stage 3, GFR 30-59 ml/min ([name]) 03/20/2015   Hyperglycemia 03/20/2015   Hyperlipidemia 03/20/2015    Diabetic hyperosmolar non-ketotic state ([place]) 07/14/2012   Lactic acidosis 07/14/2012   DKA, type 2 ([place]) 07/13/2012   Acute-on-chronic renal failure ([place]) 07/13/2012   Paroxysmal supraventricular tachycardia ([place]) 07/13/2012   Paraparesis ([place]) 07/13/2012   Paresthesia of left leg 07/13/2012   Cough 07/13/2012   Benign essential hypertension 07/13/2012   Type 2 diabetes mellitus with ketoacidosis without coma ([place]) 07/13/2012   Paresthesia of skin 07/13/2012    Past Surgical History:  Procedure Laterality Date   CHOLECYSTECTOMY       OB History   No obstetric history on file.     Family History  Problem Relation Age of Onset   Stroke Mother    Stroke Maternal Grandmother     Social History   Tobacco Use   Smoking status: Never   Smokeless tobacco: Never  Vaping Use   Vaping Use: Never used  Substance Use Topics   Alcohol use: No   Drug use: No    Home Medications Prior to Admission medications   Medication Sig Start Date End Date Taking? Authorizing Provider  Accu-Chek FastClix Lancets MISC  04/09/20   [provider]  ACCU-CHEK GUIDE test strip USE TO TEST TWICE DAILY.AL 07/05/20   [provider]  aspirin EC 81 MG tablet Take 81 mg by mouth daily.    [provider]  atorvastatin (LIPITOR) 40 MG tablet Take 40 mg by mouth at bedtime. 04/02/20   [provider]  Blood Glucose Monitoring Suppl (ACCU-CHEK GUIDE) w/Device KIT USE AS  DIRECTED.I 09/03/20   [provider]  carvedilol (COREG) 6.25 MG tablet Take 6.25 mg by mouth 2 (two) times daily with a meal. 05/13/20   [provider]  carvedilol (COREG) 6.25 MG tablet TAKE 1 TABLET (6.25 MG TOTAL) BY MOUTH 2 (TWO) TIMES DAILY WITH A MEAL. 06/23/20 06/23/21  Ghimire, Henreitta Leber, MD  cephALEXin (KEFLEX) 500 MG capsule Take 1 capsule (500 mg total) by mouth 2 (two) times daily. 02/21/21   Hayden Rasmussen, MD  Cyanocobalamin (VITAMIN B-12 CR PO) Take 1 tablet by mouth  daily.    [provider]  furosemide (LASIX) 40 MG tablet Take 40 mg by mouth daily.  03/10/20   [provider]  hydrALAZINE (APRESOLINE) 100 MG tablet Take 1 tablet (100 mg total) by mouth 3 (three) times daily. 10/22/18   Orson Eva, MD  insulin glargine (LANTUS) 100 UNIT/ML Solostar Pen Inject 25 Units into the skin daily. 06/23/20   Ghimire, Henreitta Leber, MD  insulin glargine (LANTUS) 100 UNIT/ML Solostar Pen INJECT 25 UNITS INTO THE SKIN DAILY. 06/23/20 06/23/21  Jonetta Osgood, MD  insulin lispro (HUMALOG KWIKPEN) 100 UNIT/ML KwikPen Inject 3 Units into the skin 3 (three) times daily. 06/23/20   Ghimire, Henreitta Leber, MD  insulin lispro (HUMALOG) 100 UNIT/ML KwikPen INJECT 3 UNITS INTO THE SKIN 3 (THREE) TIMES DAILY. 06/23/20 06/23/21  Jonetta Osgood, MD  Insulin Pen Needle (PEN NEEDLES) 32G X 4 MM MISC Use as directed 06/23/20   Jonetta Osgood, MD  Insulin Pen Needle 32G X 4 MM MISC USE AS DIRECTED 06/23/20 06/23/21  Ghimire, Henreitta Leber, MD  Vitamin D, Ergocalciferol, (DRISDOL) 1.25 MG (50000 UNIT) CAPS capsule Take 50,000 Units by mouth once a week. Patient not taking: No sig reported 01/08/20   [provider]    Allergies    Patient has no allergy information on record.  Review of Systems   Review of Systems  Unable to perform ROS: Mental status change  Constitutional:  Negative for appetite change and fatigue.  HENT:  Negative for congestion, ear discharge and sinus pressure.   Eyes:  Negative for discharge.  Respiratory:  Negative for cough.   Cardiovascular:  Negative for chest pain.  Gastrointestinal:  Negative for abdominal pain and diarrhea.  Genitourinary:  Negative for frequency and hematuria.  Musculoskeletal:  Negative for back pain.  Skin:  Negative for rash.  Neurological:  Positive for seizures. Negative for headaches.  Psychiatric/Behavioral:  Negative for hallucinations.    Physical Exam Updated Vital Signs BP (!) 193/127   Pulse (!)  109   Temp 99.3 F (37.4 C) (Oral)   Resp (!) 25   Ht _0  (1.753 m)   Wt 67 kg   SpO2 93%   BMI 21.81 kg/m   Physical Exam Vitals and nursing note reviewed.  Constitutional:      Appearance: She is well-developed.  HENT:     Head: Normocephalic.     Nose: Nose normal.  Eyes:     General: No scleral icterus.    Conjunctiva/sclera: Conjunctivae normal.  Neck:     Thyroid: No thyromegaly.  Cardiovascular:     Rate and Rhythm: Normal rate and regular rhythm.     Heart sounds: No murmur heard.   No friction rub. No gallop.  Pulmonary:     Breath sounds: No stridor. No wheezing or rales.  Chest:     Chest wall: No tenderness.  Abdominal:     General: There  is no distension.     Tenderness: There is no abdominal tenderness. There is no rebound.  Musculoskeletal:        General: Normal range of motion.     Cervical back: Neck supple.  Lymphadenopathy:     Cervical: No cervical adenopathy.  Skin:    Findings: No erythema or rash.  Neurological:     Motor: No abnormal muscle tone.     Coordination: Coordination normal.     Comments: Patient alert and oriented to person only  Psychiatric:        Behavior: Behavior normal.    ED Results / Procedures / Treatments   Labs (all labs ordered are listed, but only abnormal results are displayed) Labs Reviewed  COMPREHENSIVE METABOLIC PANEL - Abnormal; Notable for the following components:      Result Value   Sodium 128 (*)    Chloride 88 (*)    CO2 21 (*)    Glucose, Bld 753 (*)    BUN 51 (*)    Creatinine, Ser 3.23 (*)    GFR, Estimated 14 (*)    Anion gap 19 (*)    All other components within normal limits  LACTIC ACID, PLASMA - Abnormal; Notable for the following components:   Lactic Acid, Venous 7.4 (*)    All other components within normal limits  BLOOD GAS, VENOUS - Abnormal; Notable for the following components:   pCO2, Ven 41.2 (*)    pO2, Ven 55.0 (*)    All other components within normal limits  I-STAT  CHEM 8, ED - Abnormal; Notable for the following components:   Sodium 128 (*)    Chloride 91 (*)    BUN 46 (*)    Creatinine, Ser 3.10 (*)    Glucose, Bld >700 (*)    Calcium, Ion 1.06 (*)    All other components within normal limits  CBG MONITORING, ED - Abnormal; Notable for the following components:   Glucose-Capillary >600 (*)    All other components within normal limits  CBG MONITORING, ED - Abnormal; Notable for the following components:   Glucose-Capillary >600 (*)    All other components within normal limits  TROPONIN I (HIGH SENSITIVITY) - Abnormal; Notable for the following components:   Troponin I (High Sensitivity) 25 (*)    All other components within normal limits  RESP PANEL BY RT-PCR (FLU A&B, COVID) ARPGX2  CBC WITH DIFFERENTIAL/PLATELET  OSMOLALITY  URINALYSIS, ROUTINE W REFLEX MICROSCOPIC    EKG EKG Interpretation  Date/Time:  Tuesday February 22 2021 22:15:10 EDT Ventricular Rate:  105 PR Interval:  144 QRS Duration: 109 QT Interval:  375 QTC Calculation: 496 R Axis:   -37 Text Interpretation: Sinus or ectopic atrial tachycardia LVH with secondary repolarization abnormality Inferior infarct, old Anterior infarct, old Confirmed by Gerlene Fee [phone]) on 02/22/2021 11:28:57 PM  Radiology DG Chest Port 1 View  Result Date: 02/21/2021 CLINICAL DATA:  Right side chest pain EXAM: PORTABLE CHEST 1 VIEW COMPARISON:  07/10/2020 FINDINGS: Cardiomegaly. Tortuous aorta. No confluent opacities or effusions. No acute bony abnormality. No overt edema. IMPRESSION: Cardiomegaly, tortuous aorta. No active disease. Electronically Signed   By: Rolm Baptise M.D.   On: 02/21/2021 20:16   CT Renal Stone Study  Result Date: 02/21/2021 CLINICAL DATA:  Right flank pain. EXAM: CT ABDOMEN AND PELVIS WITHOUT CONTRAST TECHNIQUE: Multidetector CT imaging of the abdomen and pelvis was performed following the standard protocol without IV contrast. COMPARISON:  Renal ultrasound  04/05/2020. FINDINGS:  Lower chest: There is some scarring or atelectasis in the lung bases. The heart is enlarged. There is a small pericardial effusion. Hepatobiliary: No focal liver abnormality is seen. Status post cholecystectomy. Common bile duct is enlarged measuring 1 cm. Pancreas: Unremarkable. No pancreatic ductal dilatation or surrounding inflammatory changes. Spleen: Normal in size without focal abnormality. Adrenals/Urinary Tract: Adrenal glands are unremarkable. Kidneys are normal, without renal calculi, or hydronephrosis. There are 2 rounded mildly hyperdense areas in the right kidney, indeterminate. Bladder is unremarkable. Stomach/Bowel: The colon is markedly redundant. There is gaseous distention of the sigmoid colon. No definitive transition point visualized. No definite twisting identified. The appendix is not seen. Small bowel and stomach are within normal limits. Vascular/Lymphatic: Aortic atherosclerosis. No enlarged abdominal or pelvic lymph nodes. Reproductive: There is a catheter visualized in the uterus, indeterminate. Ovary is not well delineated. Other: No abdominal wall hernia or abnormality. No abdominopelvic ascites. Musculoskeletal: Degenerative changes affect the spine. IMPRESSION: 1. No evidence for urinary tract calculus or obstruction. 2. Markedly redundant colon. The sigmoid colon is distended with air. No definite twisting identified he has. If there is high clinical concern for volvulus consider repeat examination with oral contrast. 3. Catheter visualized in the uterus, indeterminate. Recommend clinical correlation and follow-up. 4. Common bile duct is enlarged status post cholecystectomy. Correlate with lab values to exclude distal biliary obstruction. 5. Cardiomegaly with small pericardial effusion. 6. There are 2 rounded mildly hyperdense lesions in the right kidney, indeterminate. These may represent proteinaceous cyst. These can be further characterized with ultrasound or  MRI as clinically appropriate. 7.  Aortic Atherosclerosis (ICD10-I70.0). Electronically Signed   By: Ronney Asters M.D.   On: 02/21/2021 22:58    Procedures Procedures   Medications Ordered in ED Medications  insulin regular, human (MYXREDLIN) 100 units/ 100 mL infusion (8.5 Units/hr Intravenous New Bag/Given 02/22/21 2316)  lactated ringers infusion ( Intravenous New Bag/Given 02/22/21 2317)  dextrose 5 % in lactated ringers infusion (has no administration in time range)  dextrose 50 % solution 0-50 mL (has no administration in time range)  potassium chloride 10 mEq in 100 mL IVPB (10 mEq Intravenous New Bag/Given 02/22/21 2317)  sodium chloride 0.9 % bolus 1,000 mL (has no administration in time range)  levETIRAcetam (KEPPRA) IVPB 1000 mg/100 mL premix (0 mg Intravenous Stopped 02/22/21 2258)  sodium chloride 0.9 % bolus 1,000 mL (1,000 mLs Intravenous New Bag/Given 02/22/21 2252)    ED Course  I have reviewed the triage vital signs and the nursing notes.  Pertinent labs & imaging results that were available during my care of the patient were reviewed by me and considered in my medical decision making (see chart for details).   CRITICAL CARE Performed by: Milton Ferguson Total critical care time: 45 minutes Critical care time was exclusive of separately billable procedures and treating other patients. Critical care was necessary to treat or prevent imminent or life-threatening deterioration. Critical care was time spent personally by me on the following activities: development of treatment plan with patient and/or surrogate as well as nursing, discussions with consultants, evaluation of patient's response to treatment, examination of patient, obtaining history from patient or surrogate, ordering and performing treatments and interventions, ordering and review of laboratory studies, ordering and review of radiographic studies, pulse oximetry and re-evaluation of patient's condition.  MDM  Rules/Calculators/A&P                           Patient with  probably 2 seizures.  Patient also has severely elevated glucose and is started on an insulin drip.  CT head pending. Final Clinical Impression(s) / ED Diagnoses Final diagnoses:  None    Rx / DC Orders ED Discharge Orders     None        Milton Ferguson, MD 02/26/21 1310

## 2021-02-22 NOTE — ED Notes (Signed)
Family called; otw to get pt

## 2021-02-22 NOTE — ED Provider Notes (Signed)
  Provider Note MRN:  [id number]  Arrival date & time: 02/24/21    ED Course and Medical Decision Making  Assumed care from Dr. Roderic Palau at shift change.  Recent treatment for UTI, here with altered mental status, seizure, hyperglycemia.  Awaiting CT head, will need admission.  .Critical Care Performed by: Maudie Flakes, MD Authorized by: Maudie Flakes, MD   Critical care provider statement:    Critical care time (minutes):  32   Critical care time was exclusive of:  Separately billable procedures and treating other patients   Critical care was necessary to treat or prevent imminent or life-threatening deterioration of the following conditions:  Metabolic crisis   Critical care was time spent personally by me on the following activities:  Ordering and performing treatments and interventions, ordering and review of laboratory studies, ordering and review of radiographic studies, re-evaluation of patient's condition and discussions with consultants   I assumed direction of critical care for this patient from another provider in my specialty: yes     Care discussed with: admitting provider    Final Clinical Impressions(s) / ED Diagnoses     ICD-10-CM   1. Hyperosmolar hyperglycemic state (HHS) [facility])  E11.00       ED Discharge Orders     None       Discharge Instructions   None     Barth Kirks. [place] Small, [place] [email or web address]    Maudie Flakes, MD 02/24/21 [id number]

## 2021-02-23 ENCOUNTER — Inpatient Hospital Stay (HOSPITAL_COMMUNITY)
Admit: 2021-02-23 | Discharge: 2021-02-23 | Disposition: A | Discharge status: Home / Self Care | Payer: Medicare Other | Attending: Internal Medicine | Admitting: Internal Medicine

## 2021-02-23 DIAGNOSIS — I7389 Other specified peripheral vascular diseases: Secondary | ICD-10-CM | POA: Insufficient documentation

## 2021-02-23 DIAGNOSIS — E1100 Type 2 diabetes mellitus with hyperosmolarity without nonketotic hyperglycemic-hyperosmolar coma (NKHHC): Secondary | ICD-10-CM

## 2021-02-23 DIAGNOSIS — N12 Tubulo-interstitial nephritis, not specified as acute or chronic: Secondary | ICD-10-CM | POA: Diagnosis present

## 2021-02-23 DIAGNOSIS — I10 Essential (primary) hypertension: Secondary | ICD-10-CM | POA: Diagnosis not present

## 2021-02-23 DIAGNOSIS — R109 Unspecified abdominal pain: Secondary | ICD-10-CM | POA: Diagnosis present

## 2021-02-23 DIAGNOSIS — E1110 Type 2 diabetes mellitus with ketoacidosis without coma: Secondary | ICD-10-CM | POA: Diagnosis not present

## 2021-02-23 DIAGNOSIS — I129 Hypertensive chronic kidney disease with stage 1 through stage 4 chronic kidney disease, or unspecified chronic kidney disease: Secondary | ICD-10-CM | POA: Diagnosis present

## 2021-02-23 DIAGNOSIS — N189 Chronic kidney disease, unspecified: Secondary | ICD-10-CM | POA: Diagnosis not present

## 2021-02-23 DIAGNOSIS — Z823 Family history of stroke: Secondary | ICD-10-CM | POA: Diagnosis not present

## 2021-02-23 DIAGNOSIS — E782 Mixed hyperlipidemia: Secondary | ICD-10-CM | POA: Diagnosis not present

## 2021-02-23 DIAGNOSIS — N390 Urinary tract infection, site not specified: Secondary | ICD-10-CM | POA: Diagnosis not present

## 2021-02-23 DIAGNOSIS — Z79899 Other long term (current) drug therapy: Secondary | ICD-10-CM | POA: Diagnosis not present

## 2021-02-23 DIAGNOSIS — Z20822 Contact with and (suspected) exposure to covid-19: Secondary | ICD-10-CM | POA: Diagnosis present

## 2021-02-23 DIAGNOSIS — D631 Anemia in chronic kidney disease: Secondary | ICD-10-CM | POA: Diagnosis present

## 2021-02-23 DIAGNOSIS — R778 Other specified abnormalities of plasma proteins: Secondary | ICD-10-CM | POA: Diagnosis not present

## 2021-02-23 DIAGNOSIS — E1165 Type 2 diabetes mellitus with hyperglycemia: Secondary | ICD-10-CM

## 2021-02-23 DIAGNOSIS — R569 Unspecified convulsions: Secondary | ICD-10-CM | POA: Diagnosis not present

## 2021-02-23 DIAGNOSIS — I160 Hypertensive urgency: Secondary | ICD-10-CM | POA: Diagnosis not present

## 2021-02-23 DIAGNOSIS — E872 Acidosis, unspecified: Secondary | ICD-10-CM | POA: Diagnosis not present

## 2021-02-23 DIAGNOSIS — E871 Hypo-osmolality and hyponatremia: Secondary | ICD-10-CM | POA: Diagnosis not present

## 2021-02-23 DIAGNOSIS — N179 Acute kidney failure, unspecified: Secondary | ICD-10-CM

## 2021-02-23 DIAGNOSIS — L89151 Pressure ulcer of sacral region, stage 1: Secondary | ICD-10-CM | POA: Diagnosis present

## 2021-02-23 DIAGNOSIS — E1122 Type 2 diabetes mellitus with diabetic chronic kidney disease: Secondary | ICD-10-CM | POA: Diagnosis present

## 2021-02-23 DIAGNOSIS — R7989 Other specified abnormal findings of blood chemistry: Secondary | ICD-10-CM

## 2021-02-23 DIAGNOSIS — Z8673 Personal history of transient ischemic attack (TIA), and cerebral infarction without residual deficits: Secondary | ICD-10-CM | POA: Diagnosis not present

## 2021-02-23 DIAGNOSIS — N184 Chronic kidney disease, stage 4 (severe): Secondary | ICD-10-CM | POA: Diagnosis present

## 2021-02-23 DIAGNOSIS — Z7982 Long term (current) use of aspirin: Secondary | ICD-10-CM | POA: Diagnosis not present

## 2021-02-23 DIAGNOSIS — G9341 Metabolic encephalopathy: Secondary | ICD-10-CM

## 2021-02-23 DIAGNOSIS — Z794 Long term (current) use of insulin: Secondary | ICD-10-CM | POA: Diagnosis not present

## 2021-02-23 LAB — CBC
HCT: 35.6 % — ABNORMAL LOW (ref 36.0–46.0)
Hemoglobin: 11.8 g/dL — ABNORMAL LOW (ref 12.0–15.0)
MCH: 29.1 pg (ref 26.0–34.0)
MCHC: 33.1 g/dL (ref 30.0–36.0)
MCV: 87.9 fL (ref 80.0–100.0)
Platelets: 165 10*3/uL (ref 150–400)
RBC: 4.05 MIL/uL (ref 3.87–5.11)
RDW: 12.7 % (ref 11.5–15.5)
WBC: 8.1 10*3/uL (ref 4.0–10.5)
nRBC: 0 % (ref 0.0–0.2)

## 2021-02-23 LAB — BETA-HYDROXYBUTYRIC ACID
Beta-Hydroxybutyric Acid: 0.91 mmol/L — ABNORMAL HIGH (ref 0.05–0.27)
Beta-Hydroxybutyric Acid: 0.09 mmol/L (ref 0.05–0.27)

## 2021-02-23 LAB — CBG MONITORING, ED
Glucose-Capillary: 109 mg/dL — ABNORMAL HIGH (ref 70–99)
Glucose-Capillary: 128 mg/dL — ABNORMAL HIGH (ref 70–99)
Glucose-Capillary: 148 mg/dL — ABNORMAL HIGH (ref 70–99)
Glucose-Capillary: 165 mg/dL — ABNORMAL HIGH (ref 70–99)
Glucose-Capillary: 184 mg/dL — ABNORMAL HIGH (ref 70–99)
Glucose-Capillary: 235 mg/dL — ABNORMAL HIGH (ref 70–99)
Glucose-Capillary: 237 mg/dL — ABNORMAL HIGH (ref 70–99)
Glucose-Capillary: 263 mg/dL — ABNORMAL HIGH (ref 70–99)
Glucose-Capillary: 358 mg/dL — ABNORMAL HIGH (ref 70–99)
Glucose-Capillary: 458 mg/dL — ABNORMAL HIGH (ref 70–99)
Glucose-Capillary: 492 mg/dL — ABNORMAL HIGH (ref 70–99)
Glucose-Capillary: 496 mg/dL — ABNORMAL HIGH (ref 70–99)
Glucose-Capillary: 74 mg/dL (ref 70–99)

## 2021-02-23 LAB — COMPREHENSIVE METABOLIC PANEL
ALT: 18 U/L (ref 0–44)
AST: 19 U/L (ref 15–41)
Albumin: 3.2 g/dL — ABNORMAL LOW (ref 3.5–5.0)
Alkaline Phosphatase: 94 U/L (ref 38–126)
Anion gap: 11 (ref 5–15)
BUN: 45 mg/dL — ABNORMAL HIGH (ref 8–23)
CO2: 25 mmol/L (ref 22–32)
Calcium: 8.7 mg/dL — ABNORMAL LOW (ref 8.9–10.3)
Chloride: 97 mmol/L — ABNORMAL LOW (ref 98–111)
Creatinine, Ser: 2.65 mg/dL — ABNORMAL HIGH (ref 0.44–1.00)
GFR, Estimated: 17 mL/min — ABNORMAL LOW (ref >60)
Glucose, Bld: 225 mg/dL — ABNORMAL HIGH (ref 70–99)
Potassium: 3.6 mmol/L (ref 3.5–5.1)
Sodium: 133 mmol/L — ABNORMAL LOW (ref 135–145)
Total Bilirubin: 0.4 mg/dL (ref 0.3–1.2)
Total Protein: 6.2 g/dL — ABNORMAL LOW (ref 6.5–8.1)

## 2021-02-23 LAB — URINALYSIS, ROUTINE W REFLEX MICROSCOPIC
Bacteria, UA: NONE SEEN
Bilirubin Urine: NEGATIVE
Glucose, UA: >=500 mg/dL — AB
Ketones, ur: NEGATIVE mg/dL
Leukocytes,Ua: NEGATIVE
Nitrite: NEGATIVE
Protein, ur: >=300 mg/dL — AB
Specific Gravity, Urine: 1.02 (ref 1.005–1.030)
pH: 5 (ref 5.0–8.0)

## 2021-02-23 LAB — URINE CULTURE: Culture: 10000 — AB

## 2021-02-23 LAB — LACTIC ACID, PLASMA
Lactic Acid, Venous: 2.2 mmol/L (ref 0.5–1.9)
Lactic Acid, Venous: 2 mmol/L (ref 0.5–1.9)

## 2021-02-23 LAB — TROPONIN I (HIGH SENSITIVITY): Troponin I (High Sensitivity): 22 ng/L — ABNORMAL HIGH (ref <18)

## 2021-02-23 LAB — PROTIME-INR
INR: 1 (ref 0.8–1.2)
Prothrombin Time: 12.7 seconds (ref 11.4–15.2)

## 2021-02-23 LAB — BLOOD GAS, VENOUS
Acid-Base Excess: 3.1 mmol/L — ABNORMAL HIGH (ref 0.0–2.0)
Bicarbonate: 27.6 mmol/L (ref 20.0–28.0)
FIO2: 21
O2 Saturation: 98.4 %
Patient temperature: 37.4
pCO2, Ven: 35 mmHg — ABNORMAL LOW (ref 44.0–60.0)
pH, Ven: 7.49 — ABNORMAL HIGH (ref 7.250–7.430)
pO2, Ven: 113 mmHg — ABNORMAL HIGH (ref 32.0–45.0)

## 2021-02-23 LAB — RESP PANEL BY RT-PCR (FLU A&B, COVID) ARPGX2
Influenza A by PCR: NEGATIVE
Influenza B by PCR: NEGATIVE
SARS Coronavirus 2 by RT PCR: NEGATIVE

## 2021-02-23 LAB — APTT: aPTT: 26 seconds (ref 24–36)

## 2021-02-23 LAB — PHOSPHORUS: Phosphorus: 2.5 mg/dL (ref 2.5–4.6)

## 2021-02-23 LAB — MAGNESIUM: Magnesium: 1.9 mg/dL (ref 1.7–2.4)

## 2021-02-23 LAB — OSMOLALITY: Osmolality: 333 mOsm/kg (ref 275–295)

## 2021-02-23 MED ORDER — ENOXAPARIN SODIUM 30 MG/0.3ML IJ SOSY
30.0000 mg | PREFILLED_SYRINGE | INTRAMUSCULAR | Status: DC
  Administered 2021-02-23 – 2021-02-24 (×2): 30 mg via SUBCUTANEOUS
  Filled 2021-02-23 (×2): qty 0.3

## 2021-02-23 MED ORDER — HYDRALAZINE HCL 25 MG PO TABS
100.0000 mg | ORAL_TABLET | Freq: Two times a day (BID) | ORAL | Status: DC
  Administered 2021-02-23 – 2021-02-24 (×3): 100 mg via ORAL
  Filled 2021-02-23 (×3): qty 4

## 2021-02-23 MED ORDER — HYDRALAZINE HCL 20 MG/ML IJ SOLN
10.0000 mg | Freq: Four times a day (QID) | INTRAMUSCULAR | Status: DC | PRN
  Administered 2021-02-23 (×3): 10 mg via INTRAVENOUS
  Filled 2021-02-23 (×3): qty 1

## 2021-02-23 MED ORDER — INSULIN GLARGINE-YFGN 100 UNIT/ML ~~LOC~~ SOLN
25.0000 [IU] | SUBCUTANEOUS | Status: DC
  Administered 2021-02-24: 25 [IU] via SUBCUTANEOUS
  Filled 2021-02-23 (×2): qty 0.25

## 2021-02-23 MED ORDER — LACTATED RINGERS IV SOLN: INTRAVENOUS | Status: DC

## 2021-02-23 MED ORDER — HYDRALAZINE HCL 20 MG/ML IJ SOLN
10.0000 mg | Freq: Once | INTRAMUSCULAR | Status: AC
  Administered 2021-02-23: 10 mg via INTRAVENOUS
  Filled 2021-02-23: qty 1

## 2021-02-23 MED ORDER — INSULIN ASPART 100 UNIT/ML IJ SOLN
6.0000 [IU] | Freq: Three times a day (TID) | INTRAMUSCULAR | Status: DC
  Administered 2021-02-23 – 2021-02-24 (×2): 6 [IU] via SUBCUTANEOUS
  Filled 2021-02-23: qty 1

## 2021-02-23 MED ORDER — CHLORHEXIDINE GLUCONATE CLOTH 2 % EX PADS
6.0000 | MEDICATED_PAD | Freq: Every day | CUTANEOUS | Status: DC
  Administered 2021-02-24: 6 via TOPICAL

## 2021-02-23 MED ORDER — SODIUM CHLORIDE 0.9 % IV SOLN
1.0000 g | Freq: Once | INTRAVENOUS | Status: AC
  Administered 2021-02-23: 1 g via INTRAVENOUS
  Filled 2021-02-23: qty 10

## 2021-02-23 MED ORDER — INSULIN GLARGINE-YFGN 100 UNIT/ML ~~LOC~~ SOLN
25.0000 [IU] | Freq: Once | SUBCUTANEOUS | Status: AC
  Administered 2021-02-23: 25 [IU] via SUBCUTANEOUS
  Filled 2021-02-23: qty 0.25

## 2021-02-23 MED ORDER — INSULIN ASPART 100 UNIT/ML IJ SOLN
0.0000 [IU] | Freq: Every day | INTRAMUSCULAR | Status: DC
Start: 2021-02-23 — End: 2021-02-24

## 2021-02-23 MED ORDER — ASPIRIN EC 81 MG PO TBEC
81.0000 mg | DELAYED_RELEASE_TABLET | Freq: Every day | ORAL | Status: DC
  Administered 2021-02-23 – 2021-02-24 (×2): 81 mg via ORAL
  Filled 2021-02-23 (×2): qty 1

## 2021-02-23 MED ORDER — METOPROLOL TARTRATE 25 MG PO TABS
12.5000 mg | ORAL_TABLET | Freq: Two times a day (BID) | ORAL | Status: DC
  Administered 2021-02-23 – 2021-02-24 (×2): 12.5 mg via ORAL
  Filled 2021-02-23 (×3): qty 1

## 2021-02-23 MED ORDER — SODIUM CHLORIDE 0.9 % IV SOLN
1.0000 g | INTRAVENOUS | Status: DC
  Administered 2021-02-24: 1 g via INTRAVENOUS
  Filled 2021-02-23: qty 10

## 2021-02-23 MED ORDER — INSULIN ASPART 100 UNIT/ML IJ SOLN
0.0000 [IU] | Freq: Three times a day (TID) | INTRAMUSCULAR | Status: DC
Start: 2021-02-23 — End: 2021-02-24
  Administered 2021-02-23: 1 [IU] via SUBCUTANEOUS
  Administered 2021-02-23: 2 [IU] via SUBCUTANEOUS
  Administered 2021-02-24: 5 [IU] via SUBCUTANEOUS
  Filled 2021-02-23 (×2): qty 1

## 2021-02-23 MED ORDER — ATORVASTATIN CALCIUM 40 MG PO TABS
80.0000 mg | ORAL_TABLET | Freq: Every day | ORAL | Status: DC
  Administered 2021-02-24: 80 mg via ORAL
  Filled 2021-02-23: qty 2

## 2021-02-23 NOTE — ED Notes (Signed)
BP trending super high. Hospitalist made aware and to give orders.

## 2021-02-23 NOTE — Consult Note (Signed)
Chelsea [name], MD     [email or web address]          AHLAYA ENDE is an 85 y.o. female.   ASSESSMENT/PLAN: METABOLIC ENCEPHALOPATHY DUE TO HYPERGLYCEMIA HYPERGLYCEMIA INDUCED  SEIZURE:  Long-term anti epileptic medications are not recommended.      This 85 year old who presents with altered mental status and confusion. During the evaluation emergency room, she had convulsive generalized tonoclonic seizure. The patient was worked up and during the evaluation she was noted to few blood sugars greater than 700. Distally, she had lactic acidosis and elevated creatinine of 3. She has been treated vigorously and has improved. She has however been  amnestic as to why she is in the hospital. She is unable to provide a history but reports she is tired and wants to sleep. She does not report focal numbness or weakness or headaches.  She does not report oral trauma.  No reports of dyspnea, chest pain or or palpitation. The review systems is unrevealing.      HPI: Chelsea Meza is a 85 y.o. female with medical history significant for type 2 diabetes mellitus, hypertension, hyperlipidemia, CKD stage IV who presents to the emergency department due to confusion.  Patient was unable to provide history, history was obtained from ED physician and ED medical record.  Per report, patient was reported to be confused while in the bathroom at home, EMS was activated and she was taken to the ED for further evaluation, while at triage, she presented with a witnessed seizure.  Patient was seen in the ED on 10/24 due to back pain and flank pain.  She was treated with pain medication, IV ceftriaxone due to pyelonephritis and patient was discharged home.  There was no report of fever, chills, chest pain, shortness of breath, nausea or vomiting     NEURO 2019 1.  Acute visual disturbance involving both eyes of unclear etiology.  It is unlikely that the patient has had a primary  neurological event to explain her symptoms.  Other possibilities includes primary ophthalmic problems such as glaucoma, hypertension and other metabolic issues.  It is possible psychosomatic disorders could be a possibility but this would have to be a diagnosis of exclusion.  Agree with the current work-up pending.  Follow-up MRI of the brain.  Agree with aspirin 81 mg.  I also suggest that she sees a ophthalmologist for further evaluation.    The patient is an 85 year old black female who was apparently waiting for relative to pick her up.  She developed acute onset of complete whitening of her vision bilaterally and involving both eyes.  The entire episode lasted for about 30 minutes.  There is some suggestion that the left side may have been more symptomatic.  She denies other associated symptoms such as focal numbness, weakness, dysarthria or dysphasia.  She denies chest pain or shortness of breath.  There is no loss of consciousness or alteration of consciousness.  She thinks she has returned to baseline.  There is no previous episodes of such events.  The review of systems otherwise negative.     Blood pressure (!) 190/107, pulse (!) 55, temperature 99.3 F (37.4 C), temperature source Oral, resp. rate 10, height '5\' 9"'  (1.753 m), weight 67 kg, SpO2 99 %.   GENERAL:   She is resting in bed but easily arousable.  HEENT:  No trauma noted; neck is supple  ABDOMEN: soft  EXTREMITIES: No edema   BACK: normal  SKIN: Normal  by inspection.    MENTAL STATUS:  She lays in bed with eyes closed but opens her eyes to verbal commands. She is oriented to year, month and hospital. Speech, language and cognition are generally intact. Judgment and insight normal.   CRANIAL NERVES: Pupils are equal, round and reactive to light and accomodation; extra ocular movements are full, there is no significant nystagmus; visual fields are full; upper and lower facial muscles are normal in strength and symmetric,  there is no flattening of the nasolabial folds; tongue is midline; uvula is midline; shoulder elevation is normal.  MOTOR: Normal tone, bulk and strength; no pronator drift.  COORDINATION: Left finger to nose is normal, right finger to nose is normal, No rest tremor; no intention tremor; no postural tremor; no bradykinesia.  REFLEXES: Deep tendon reflexes are symmetrical and normal.   SENSATION: Normal to light touch, temperature, and pain.    Past Medical History:  Diagnosis Date   Anemia    CKD (chronic kidney disease) stage 3, GFR 30-59 ml/min ([name])    Diabetes mellitus without complication ([name])    Hyperlipidemia    Hypertension    TIA (transient ischemic attack)     Past Surgical History:  Procedure Laterality Date   CHOLECYSTECTOMY      Family History  Problem Relation Age of Onset   Stroke Mother    Stroke Maternal Grandmother     Social History:  reports that she has never smoked. She has never used smokeless tobacco. She reports that she does not drink alcohol and does not use drugs.  Allergies: Not on File  Medications: Prior to Admission medications   Medication Sig Start Date End Date Taking? Authorizing Provider  aspirin EC 81 MG tablet Take 81 mg by mouth daily.   Yes [provider]  atorvastatin (LIPITOR) 80 MG tablet Take 80 mg by mouth at bedtime. 12/18/20  Yes [provider]  cephALEXin (KEFLEX) 500 MG capsule Take 1 capsule (500 mg total) by mouth 2 (two) times daily. 02/21/21  Yes Hayden Rasmussen, MD  Cyanocobalamin (VITAMIN B-12 CR PO) Take 1 tablet by mouth daily.   Yes [provider]  furosemide (LASIX) 40 MG tablet Take 40 mg by mouth daily.  03/10/20  Yes [provider]  GVOKE PFS 0.5 MG/0.1ML SOSY Inject 0.5 mg into the skin as needed (low bs). 02/08/21  Yes [provider]  hydrALAZINE (APRESOLINE) 100 MG tablet Take 1 tablet (100 mg total) by mouth 3 (three) times daily. Patient taking  differently: Take 100 mg by mouth 2 (two) times daily. 10/22/18  Yes Tat, Shanon Brow, MD  insulin glargine (LANTUS) 100 UNIT/ML Solostar Pen INJECT 25 UNITS INTO THE SKIN DAILY. Patient taking differently: Inject 25 Units into the skin daily. 06/23/20 06/23/21 Yes Ghimire, Henreitta Leber, MD  insulin lispro (HUMALOG) 100 UNIT/ML KwikPen INJECT 3 UNITS INTO THE SKIN 3 (THREE) TIMES DAILY. Patient taking differently: Inject 3 Units into the skin 3 (three) times daily. 06/23/20 06/23/21 Yes Ghimire, Henreitta Leber, MD  Vitamin D, Ergocalciferol, (DRISDOL) 1.25 MG (50000 UNIT) CAPS capsule Take 50,000 Units by mouth once a week. 01/08/20  Yes [provider]  Accu-Chek FastClix Lancets MISC  04/09/20   [provider]  ACCU-CHEK GUIDE test strip USE TO TEST TWICE DAILY.AL 07/05/20   [provider]  Blood Glucose Monitoring Suppl (ACCU-CHEK GUIDE) w/Device KIT USE AS DIRECTED.I 09/03/20   [provider]  carvedilol (COREG) 6.25 MG tablet TAKE 1 TABLET (6.25  MG TOTAL) BY MOUTH 2 (TWO) TIMES DAILY WITH A MEAL. Patient not taking: No sig reported 06/23/20 06/23/21  Jonetta Osgood, MD  Insulin Pen Needle (PEN NEEDLES) 32G X 4 MM MISC Use as directed 06/23/20   Jonetta Osgood, MD  Insulin Pen Needle 32G X 4 MM MISC USE AS DIRECTED 06/23/20 06/23/21  Ghimire, Henreitta Leber, MD    Scheduled Meds:  aspirin EC  81 mg Oral Daily   atorvastatin  80 mg Oral QHS   enoxaparin (LOVENOX) injection  30 mg Subcutaneous Q24H   hydrALAZINE  100 mg Oral BID   insulin aspart  0-5 Units Subcutaneous QHS   insulin aspart  0-9 Units Subcutaneous TID WC   insulin aspart  6 Units Subcutaneous TID WC   [START ON 02/24/2021] insulin glargine-yfgn  25 Units Subcutaneous Q24H   metoprolol tartrate  12.5 mg Oral BID   Continuous Infusions:  [START ON 02/24/2021] cefTRIAXone (ROCEPHIN)  IV     lactated ringers 70 mL/hr at 02/23/21 0758   PRN Meds:.dextrose, hydrALAZINE     Results for orders placed or  performed during the hospital encounter of 02/22/21 (from the past 48 hour(s))  CBC with Differential/Platelet     Status: None   Collection Time: 02/22/21 10:15 PM  Result Value Ref Range   WBC 9.9 4.0 - 10.5 K/uL   RBC 4.53 3.87 - 5.11 MIL/uL   Hemoglobin 13.1 12.0 - 15.0 g/dL   HCT 40.4 36.0 - 46.0 %   MCV 89.2 80.0 - 100.0 fL   MCH 28.9 26.0 - 34.0 pg   MCHC 32.4 30.0 - 36.0 g/dL   RDW 12.6 11.5 - 15.5 %   Platelets 208 150 - 400 K/uL   nRBC 0.0 0.0 - 0.2 %   Neutrophils Relative % 63 %   Neutro Abs 6.2 1.7 - 7.7 K/uL   Lymphocytes Relative 30 %   Lymphs Abs 3.0 0.7 - 4.0 K/uL   Monocytes Relative 5 %   Monocytes Absolute 0.5 0.1 - 1.0 K/uL   Eosinophils Relative 0 %   Eosinophils Absolute 0.0 0.0 - 0.5 K/uL   Basophils Relative 1 %   Basophils Absolute 0.1 0.0 - 0.1 K/uL   Immature Granulocytes 1 %   Abs Immature Granulocytes 0.05 0.00 - 0.07 K/uL    Comment: Performed at [facility], [address] [place],  [address]  Comprehensive metabolic panel     Status: Abnormal   Collection Time: 02/22/21 10:15 PM  Result Value Ref Range   Sodium 128 (L) 135 - 145 mmol/L   Potassium 3.6 3.5 - 5.1 mmol/L   Chloride 88 (L) 98 - 111 mmol/L   CO2 21 (L) 22 - 32 mmol/L   Glucose, Bld 753 (HH) 70 - 99 mg/dL    Comment: Glucose reference range applies only to samples taken after fasting for at least 8 hours. CRITICAL RESULT CALLED TO, READ BACK BY AND VERIFIED WITH: BELTON,K ON 02/22/21 AT 2310 BY LOY,C    BUN 51 (H) 8 - 23 mg/dL   Creatinine, Ser 3.23 (H) 0.44 - 1.00 mg/dL   Calcium 9.0 8.9 - 10.3 mg/dL   Total Protein 7.6 6.5 - 8.1 g/dL   Albumin 3.8 3.5 - 5.0 g/dL   AST 27 15 - 41 U/L   ALT 22 0 - 44 U/L   Alkaline Phosphatase 118 38 - 126 U/L   Total Bilirubin 0.8 0.3 - 1.2 mg/dL   GFR, Estimated 14 (L) >  60 mL/min    Comment: (NOTE) Calculated using the CKD-EPI Creatinine Equation (2021)    Anion gap 19 (H) 5 - 15    Comment: Performed at [facility],  [address], [place], [place] [address]  Lactic acid, plasma     Status: Abnormal   Collection Time: 02/22/21 10:15 PM  Result Value Ref Range   Lactic Acid, Venous 7.4 (HH) 0.5 - 1.9 mmol/L    Comment: CRITICAL RESULT CALLED TO, READ BACK BY AND VERIFIED WITH: BELTON, K ON 02/22/21 AT 2310 BY LOY,C Performed at [facility], [address] [place], [place] [address]   Troponin I (High Sensitivity)     Status: Abnormal   Collection Time: 02/22/21 10:15 PM  Result Value Ref Range   Troponin I (High Sensitivity) 25 (H) <18 ng/L    Comment: (NOTE) Elevated high sensitivity troponin I (hsTnI) values and significant  changes across serial measurements may suggest ACS but many other  chronic and acute conditions are known to elevate hsTnI results.  Refer to the Links section for chest pain algorithms and additional  guidance. Performed at [facility], [address] [place], [place] [address]   CBG monitoring, ED     Status: Abnormal   Collection Time: 02/22/21 10:23 PM  Result Value Ref Range   Glucose-Capillary >600 (HH) 70 - 99 mg/dL    Comment: Glucose reference range applies only to samples taken after fasting for at least 8 hours.  I-stat chem 8, ED (not at [facility] or [facility])     Status: Abnormal   Collection Time: 02/22/21 10:42 PM  Result Value Ref Range   Sodium 128 (L) 135 - 145 mmol/L   Potassium 4.1 3.5 - 5.1 mmol/L   Chloride 91 (L) 98 - 111 mmol/L   BUN 46 (H) 8 - 23 mg/dL   Creatinine, Ser 3.10 (H) 0.44 - 1.00 mg/dL   Glucose, Bld >700 (HH) 70 - 99 mg/dL    Comment: Glucose reference range applies only to samples taken after fasting for at least 8 hours.   Calcium, Ion 1.06 (L) 1.15 - 1.40 mmol/L   TCO2 22 22 - 32 mmol/L   Hemoglobin 13.9 12.0 - 15.0 g/dL   HCT 41.0 36.0 - 46.0 %  Urinalysis, Routine w reflex microscopic Urine, Catheterized     Status: Abnormal   Collection Time: 02/22/21 10:53 PM  Result Value Ref Range   Color, Urine YELLOW YELLOW   APPearance HAZY (A)  CLEAR   Specific Gravity, Urine 1.020 1.005 - 1.030   pH 5.0 5.0 - 8.0   Glucose, UA >=500 (A) NEGATIVE mg/dL   Hgb urine dipstick SMALL (A) NEGATIVE   Bilirubin Urine NEGATIVE NEGATIVE   Ketones, ur NEGATIVE NEGATIVE mg/dL   Protein, ur >=300 (A) NEGATIVE mg/dL   Nitrite NEGATIVE NEGATIVE   Leukocytes,Ua NEGATIVE NEGATIVE   RBC / HPF 0-5 0 - 5 RBC/hpf   WBC, UA 0-5 0 - 5 WBC/hpf   Bacteria, UA NONE SEEN NONE SEEN   Mucus PRESENT     Comment: Performed at [facility], [address] [place],  [address]  Resp Panel by RT-PCR (Flu A&B, Covid) Nasopharyngeal Swab     Status: None   Collection Time: 02/22/21 10:56 PM   Specimen: Nasopharyngeal Swab; Nasopharyngeal(NP) swabs in vial transport medium  Result Value Ref Range   SARS Coronavirus 2 by RT PCR NEGATIVE NEGATIVE    Comment: (NOTE) SARS-CoV-2 target nucleic acids are NOT DETECTED.  The  SARS-CoV-2 RNA is generally detectable in upper respiratory specimens during the acute phase of infection. The lowest concentration of SARS-CoV-2 viral copies this assay can detect is 138 copies/mL. A negative result does not preclude SARS-Cov-2 infection and should not be used as the sole basis for treatment or other patient management decisions. A negative result may occur with  improper specimen collection/handling, submission of specimen other than nasopharyngeal swab, presence of viral mutation(s) within the areas targeted by this assay, and inadequate number of viral copies(<138 copies/mL). A negative result must be combined with clinical observations, patient history, and epidemiological information. The expected result is Negative.  Fact Sheet for Patients:  EntrepreneurPulse.com.au  Fact Sheet for Healthcare Providers:  IncredibleEmployment.be  This test is no t yet approved or cleared by the [place] FDA and  has been authorized for detection and/or diagnosis of SARS-CoV-2 by FDA  under an Emergency Use Authorization (EUA). This EUA will remain  in effect (meaning this test can be used) for the duration of the COVID-19 declaration under Section 564(b)(1) of the Act, 21 U.S.C.section 360bbb-3(b)(1), unless the authorization is terminated  or revoked sooner.       Influenza A by PCR NEGATIVE NEGATIVE   Influenza B by PCR NEGATIVE NEGATIVE    Comment: (NOTE) The Xpert Xpress SARS-CoV-2/FLU/RSV plus assay is intended as an aid in the diagnosis of influenza from Nasopharyngeal swab specimens and should not be used as a sole basis for treatment. Nasal washings and aspirates are unacceptable for Xpert Xpress SARS-CoV-2/FLU/RSV testing.  Fact Sheet for Patients: EntrepreneurPulse.com.au  Fact Sheet for Healthcare Providers: IncredibleEmployment.be  This test is not yet approved or cleared by the [place] FDA and has been authorized for detection and/or diagnosis of SARS-CoV-2 by FDA under an Emergency Use Authorization (EUA). This EUA will remain in effect (meaning this test can be used) for the duration of the COVID-19 declaration under Section 564(b)(1) of the Act, 21 U.S.C. section 360bbb-3(b)(1), unless the authorization is terminated or revoked.  Performed at [facility], [address] [place], [place] [address]   Osmolality     Status: Abnormal   Collection Time: 02/22/21 11:12 PM  Result Value Ref Range   Osmolality 333 (HH) 275 - 295 mOsm/kg    Comment: CRITICAL RESULT CALLED TO, READ BACK BY AND VERIFIED WITH: S.SELF RN '@1420'  ON 02/23/2021 BY ZUNIGA,M MT CRITICAL RESULT CALLED TO, READ BACK BY AND VERIFIED WITH: RN ROBERT WARD RN,02/23/2021,1438,SELF S. Performed at [facility], [address], [place], [place] [address] CORRECTED ON 10/26 AT 1442: PREVIOUSLY REPORTED AS 333 CRITICAL RESULT CALLED TO, READ BACK BY AND VERIFIED WITH: S.SELF RN '@1420'  ON 02/23/2021 BY ZUNIGA,M MT   CBG monitoring, ED      Status: Abnormal   Collection Time: 02/22/21 11:14 PM  Result Value Ref Range   Glucose-Capillary >600 (HH) 70 - 99 mg/dL    Comment: Glucose reference range applies only to samples taken after fasting for at least 8 hours.  Blood gas, venous     Status: Abnormal   Collection Time: 02/22/21 11:14 PM  Result Value Ref Range   FIO2 21.00    pH, Ven 7.396 7.250 - 7.430   pCO2, Ven 41.2 (L) 44.0 - 60.0 mmHg   pO2, Ven 55.0 (H) 32.0 - 45.0 mmHg   Bicarbonate 24.5 20.0 - 28.0 mmol/L   Acid-Base Excess 0.4 0.0 - 2.0 mmol/L   O2 Saturation 84.2 %   Patient temperature 37.4     Comment:  Performed at [facility], [address], [place], [place] [address]  CBG monitoring, ED     Status: Abnormal   Collection Time: 02/22/21 11:47 PM  Result Value Ref Range   Glucose-Capillary >600 (HH) 70 - 99 mg/dL    Comment: Glucose reference range applies only to samples taken after fasting for at least 8 hours.  Troponin I (High Sensitivity)     Status: Abnormal   Collection Time: 02/23/21 12:21 AM  Result Value Ref Range   Troponin I (High Sensitivity) 22 (H) <18 ng/L    Comment: (NOTE) Elevated high sensitivity troponin I (hsTnI) values and significant  changes across serial measurements may suggest ACS but many other  chronic and acute conditions are known to elevate hsTnI results.  Refer to the "Links" section for chest pain algorithms and additional  guidance. Performed at [facility], [address] [place], [place] [address]   Beta-hydroxybutyric acid     Status: Abnormal   Collection Time: 02/23/21 12:21 AM  Result Value Ref Range   Beta-Hydroxybutyric Acid 0.91 (H) 0.05 - 0.27 mmol/L    Comment: Performed at [facility], [address], [place], [place] [address]  CBG monitoring, ED     Status: Abnormal   Collection Time: 02/23/21 12:24 AM  Result Value Ref Range   Glucose-Capillary 496 (H) 70 - 99 mg/dL    Comment: Glucose reference range applies only to samples taken after fasting for  at least 8 hours.  CBG monitoring, ED     Status: Abnormal   Collection Time: 02/23/21 12:52 AM  Result Value Ref Range   Glucose-Capillary 492 (H) 70 - 99 mg/dL    Comment: Glucose reference range applies only to samples taken after fasting for at least 8 hours.  CBG monitoring, ED     Status: Abnormal   Collection Time: 02/23/21  1:22 AM  Result Value Ref Range   Glucose-Capillary 458 (H) 70 - 99 mg/dL    Comment: Glucose reference range applies only to samples taken after fasting for at least 8 hours.  CBG monitoring, ED     Status: Abnormal   Collection Time: 02/23/21  1:52 AM  Result Value Ref Range   Glucose-Capillary 358 (H) 70 - 99 mg/dL    Comment: Glucose reference range applies only to samples taken after fasting for at least 8 hours.  CBG monitoring, ED     Status: Abnormal   Collection Time: 02/23/21  2:52 AM  Result Value Ref Range   Glucose-Capillary 263 (H) 70 - 99 mg/dL    Comment: Glucose reference range applies only to samples taken after fasting for at least 8 hours.  CBG monitoring, ED     Status: Abnormal   Collection Time: 02/23/21  3:52 AM  Result Value Ref Range   Glucose-Capillary 237 (H) 70 - 99 mg/dL    Comment: Glucose reference range applies only to samples taken after fasting for at least 8 hours.  CBG monitoring, ED     Status: Abnormal   Collection Time: 02/23/21  4:45 AM  Result Value Ref Range   Glucose-Capillary 235 (H) 70 - 99 mg/dL    Comment: Glucose reference range applies only to samples taken after fasting for at least 8 hours.  CBC     Status: Abnormal   Collection Time: 02/23/21  5:24 AM  Result Value Ref Range   WBC 8.1 4.0 - 10.5 K/uL   RBC 4.05 3.87 - 5.11 MIL/uL   Hemoglobin 11.8 (L)  12.0 - 15.0 g/dL   HCT 35.6 (L) 36.0 - 46.0 %   MCV 87.9 80.0 - 100.0 fL   MCH 29.1 26.0 - 34.0 pg   MCHC 33.1 30.0 - 36.0 g/dL   RDW 12.7 11.5 - 15.5 %   Platelets 165 150 - 400 K/uL   nRBC 0.0 0.0 - 0.2 %    Comment: Performed at [facility], [address] [place], [place] [address]  Comprehensive metabolic panel     Status: Abnormal   Collection Time: 02/23/21  5:24 AM  Result Value Ref Range   Sodium 133 (L) 135 - 145 mmol/L   Potassium 3.6 3.5 - 5.1 mmol/L   Chloride 97 (L) 98 - 111 mmol/L   CO2 25 22 - 32 mmol/L   Glucose, Bld 225 (H) 70 - 99 mg/dL    Comment: Glucose reference range applies only to samples taken after fasting for at least 8 hours.   BUN 45 (H) 8 - 23 mg/dL   Creatinine, Ser 2.65 (H) 0.44 - 1.00 mg/dL   Calcium 8.7 (L) 8.9 - 10.3 mg/dL   Total Protein 6.2 (L) 6.5 - 8.1 g/dL   Albumin 3.2 (L) 3.5 - 5.0 g/dL   AST 19 15 - 41 U/L   ALT 18 0 - 44 U/L   Alkaline Phosphatase 94 38 - 126 U/L   Total Bilirubin 0.4 0.3 - 1.2 mg/dL   GFR, Estimated 17 (L) >60 mL/min    Comment: (NOTE) Calculated using the CKD-EPI Creatinine Equation (2021)    Anion gap 11 5 - 15    Comment: Performed at [facility], [address] [place], [place] [address]  Protime-INR     Status: None   Collection Time: 02/23/21  5:24 AM  Result Value Ref Range   Prothrombin Time 12.7 11.4 - 15.2 seconds   INR 1.0 0.8 - 1.2    Comment: (NOTE) INR goal varies based on device and disease states. Performed at [facility], [address] [place], [place] [address]   APTT     Status: None   Collection Time: 02/23/21  5:24 AM  Result Value Ref Range   aPTT 26 24 - 36 seconds    Comment: Performed at [facility], [address], [place], [place] [address]  Magnesium     Status: None   Collection Time: 02/23/21  5:24 AM  Result Value Ref Range   Magnesium 1.9 1.7 - 2.4 mg/dL    Comment: Performed at [facility], [address] [place], [place] [address]  Phosphorus     Status: None   Collection Time: 02/23/21  5:24 AM  Result Value Ref Range   Phosphorus 2.5 2.5 - 4.6 mg/dL    Comment: Performed at [facility], [address] [place], [place] [address]  Lactic acid, plasma     Status: Abnormal   Collection Time: 02/23/21   5:24 AM  Result Value Ref Range   Lactic Acid, Venous 2.2 (HH) 0.5 - 1.9 mmol/L    Comment: CRITICAL VALUE NOTED.  VALUE IS CONSISTENT WITH PREVIOUSLY REPORTED AND CALLED VALUE. Performed at [facility], [address], [place], [place] [address]   Blood gas, venous     Status: Abnormal   Collection Time: 02/23/21  5:25 AM  Result Value Ref Range   FIO2 21.00    pH, Ven 7.490 (H) 7.250 - 7.430   pCO2, Ven 35.0 (L) 44.0 - 60.0 mmHg   pO2, Ven 113.0 (H)  32.0 - 45.0 mmHg   Bicarbonate 27.6 20.0 - 28.0 mmol/L   Acid-Base Excess 3.1 (H) 0.0 - 2.0 mmol/L   O2 Saturation 98.4 %   Patient temperature 37.4     Comment: Performed at [facility], [address], [place], [place] [address]  CBG monitoring, ED     Status: Abnormal   Collection Time: 02/23/21  5:58 AM  Result Value Ref Range   Glucose-Capillary 184 (H) 70 - 99 mg/dL    Comment: Glucose reference range applies only to samples taken after fasting for at least 8 hours.  Beta-hydroxybutyric acid     Status: None   Collection Time: 02/23/21  6:25 AM  Result Value Ref Range   Beta-Hydroxybutyric Acid 0.09 0.05 - 0.27 mmol/L    Comment: Performed at [facility], [address] [place], [place] [address]  Lactic acid, plasma     Status: Abnormal   Collection Time: 02/23/21  6:59 AM  Result Value Ref Range   Lactic Acid, Venous 2.0 (HH) 0.5 - 1.9 mmol/L    Comment: CRITICAL VALUE NOTED.  VALUE IS CONSISTENT WITH PREVIOUSLY REPORTED AND CALLED VALUE. Performed at [facility], [address] [place],  [address]   CBG monitoring, ED     Status: Abnormal   Collection Time: 02/23/21  7:48 AM  Result Value Ref Range   Glucose-Capillary 165 (H) 70 - 99 mg/dL    Comment: Glucose reference range applies only to samples taken after fasting for at least 8 hours.  CBG monitoring, ED     Status: Abnormal   Collection Time: 02/23/21  9:05 AM  Result Value Ref Range   Glucose-Capillary 148 (H) 70 - 99 mg/dL    Comment: Glucose  reference range applies only to samples taken after fasting for at least 8 hours.  CBG monitoring, ED     Status: None   Collection Time: 02/23/21 12:15 PM  Result Value Ref Range   Glucose-Capillary 74 70 - 99 mg/dL    Comment: Glucose reference range applies only to samples taken after fasting for at least 8 hours.  CBG monitoring, ED     Status: Abnormal   Collection Time: 02/23/21  5:24 PM  Result Value Ref Range   Glucose-Capillary 128 (H) 70 - 99 mg/dL    Comment: Glucose reference range applies only to samples taken after fasting for at least 8 hours.    Studies/Results:  EEG  IMPRESSION: This study is within normal limits. No seizures or epileptiform discharges were seen throughout the recording.    HEAD CT FINDINGS: Brain: Mild age-related atrophy and chronic microvascular ischemic changes. There is no acute intracranial hemorrhage. No mass effect or midline shift. No extra-axial fluid collection.   Vascular: No hyperdense vessel or unexpected calcification.   Skull: Normal. Negative for fracture or focal lesion.   Sinuses/Orbits: No acute finding.   Other: None   IMPRESSION: 1. No acute intracranial pathology. 2. Mild age-related atrophy and chronic microvascular ischemic Changes     The head CT is reviewed in person and shows no acute changes. There is moderate global atrophy. No focal encephalomalacia is noted. No hemorrhage.   EEG 2019 -  NORMAL   Radin Raptis A. Merlene Meza, M.D.  Diplomate, Tax adviser of Psychiatry and Neurology ( Neurology). 02/23/2021, 7:15 PM

## 2021-02-23 NOTE — ED Notes (Signed)
Notified Dr. Wynetta Emery that BP's remain increased.

## 2021-02-23 NOTE — H&P (Signed)
History and Physical  Chelsea Meza UDT:[id number] DOB: 01/09/1936 DOA: 02/22/2021  Referring physician: Milton Ferguson, MD PCP: Iona Beard, MD  Patient coming from: Home  Chief Complaint: Confusion  HPI: Chelsea Meza is a 85 y.o. female with medical history significant for type 2 diabetes mellitus, hypertension, hyperlipidemia, CKD stage IV who presents to the emergency department due to confusion.  Patient was unable to provide history, history was obtained from ED physician and ED medical record.  Per report, patient was reported to be confused while in the bathroom at home, EMS was activated and she was taken to the ED for further evaluation, while at triage, she presented with a witnessed seizure.  Patient was seen in the ED on 10/24 due to back pain and flank pain.  She was treated with pain medication, IV ceftriaxone due to pyelonephritis and patient was discharged home.  There was no report of fever, chills, chest pain, shortness of breath, nausea or vomiting  ED Course:  In the emergency department, she was intermittently tachypneic and tachycardic.  BP was 193/127.  Work-up in the ED showed normal CBC, hyponatremia, elevated troponin, urinalysis was positive for glycosuria, proteinuria, trace leukocytes and rare bacteria.  Lactic acid 7.4, BUN to creatinine 51/3.23 (baseline creatinine at 2.1-2.9), beta hydroxybutyric acid 0.91.  Influenza A, B, SARS coronavirus 2 was negative. Chest x-ray showed cardiomegaly with no active disease CT head without contrast showed no acute intracranial pathology CT abdomen and pelvis without contrast showed no evidence for urinary tract calculus or obstruction Patient was started on ceftriaxone, Keppra was given due to seizures, IV hydration was provided and patient was started on IV insulin drip per Endo tool  Review of Systems: This cannot be obtained at this time due to patient's altered mental status.   Past Medical History:  Diagnosis  Date   Anemia    CKD (chronic kidney disease) stage 3, GFR 30-59 ml/min ([name])    Diabetes mellitus without complication ([name])    Hyperlipidemia    Hypertension    TIA (transient ischemic attack)    Past Surgical History:  Procedure Laterality Date   CHOLECYSTECTOMY      Social History:  reports that she has never smoked. She has never used smokeless tobacco. She reports that she does not drink alcohol and does not use drugs.   Not on File  Family History  Problem Relation Age of Onset   Stroke Mother    Stroke Maternal Grandmother      Prior to Admission medications   Medication Sig Start Date End Date Taking? Authorizing Provider  Accu-Chek FastClix Lancets MISC  04/09/20   [provider]  ACCU-CHEK GUIDE test strip USE TO TEST TWICE DAILY.AL 07/05/20   [provider]  aspirin EC 81 MG tablet Take 81 mg by mouth daily.    [provider]  atorvastatin (LIPITOR) 40 MG tablet Take 40 mg by mouth at bedtime. 04/02/20   [provider]  Blood Glucose Monitoring Suppl (ACCU-CHEK GUIDE) w/Device KIT USE AS DIRECTED.I 09/03/20   [provider]  carvedilol (COREG) 6.25 MG tablet Take 6.25 mg by mouth 2 (two) times daily with a meal. 05/13/20   [provider]  carvedilol (COREG) 6.25 MG tablet TAKE 1 TABLET (6.25 MG TOTAL) BY MOUTH 2 (TWO) TIMES DAILY WITH A MEAL. 06/23/20 06/23/21  Ghimire, Henreitta Leber, MD  cephALEXin (KEFLEX) 500 MG capsule Take 1 capsule (500 mg total) by mouth 2 (two) times daily. 02/21/21  Hayden Rasmussen, MD  Cyanocobalamin (VITAMIN B-12 CR PO) Take 1 tablet by mouth daily.    [provider]  furosemide (LASIX) 40 MG tablet Take 40 mg by mouth daily.  03/10/20   [provider]  hydrALAZINE (APRESOLINE) 100 MG tablet Take 1 tablet (100 mg total) by mouth 3 (three) times daily. 10/22/18   Orson Eva, MD  insulin glargine (LANTUS) 100 UNIT/ML Solostar Pen Inject 25 Units into the skin daily. 06/23/20    Ghimire, Henreitta Leber, MD  insulin glargine (LANTUS) 100 UNIT/ML Solostar Pen INJECT 25 UNITS INTO THE SKIN DAILY. 06/23/20 06/23/21  Jonetta Osgood, MD  insulin lispro (HUMALOG KWIKPEN) 100 UNIT/ML KwikPen Inject 3 Units into the skin 3 (three) times daily. 06/23/20   Ghimire, Henreitta Leber, MD  insulin lispro (HUMALOG) 100 UNIT/ML KwikPen INJECT 3 UNITS INTO THE SKIN 3 (THREE) TIMES DAILY. 06/23/20 06/23/21  Jonetta Osgood, MD  Insulin Pen Needle (PEN NEEDLES) 32G X 4 MM MISC Use as directed 06/23/20   Jonetta Osgood, MD  Insulin Pen Needle 32G X 4 MM MISC USE AS DIRECTED 06/23/20 06/23/21  Ghimire, Henreitta Leber, MD  Vitamin D, Ergocalciferol, (DRISDOL) 1.25 MG (50000 UNIT) CAPS capsule Take 50,000 Units by mouth once a week. Patient not taking: No sig reported 01/08/20   [provider]    Physical Exam: BP (!) 187/116   Pulse (!) 103   Temp 99.3 F (37.4 C) (Oral)   Resp 20   Ht '5\' 9"'  (1.753 m)   Wt 67 kg   SpO2 100%   BMI 21.81 kg/m   General: 85 y.o. year-old female well developed well nourished in no acute distress.  Alert and oriented x1 (person only). HEENT: NCAT, EOMI Neck: Supple, trachea medial Cardiovascular: Regular rate and rhythm with no rubs or gallops.  No thyromegaly or JVD noted.  No lower extremity edema. 2/4 pulses in all 4 extremities. Respiratory: Clear to auscultation with no wheezes or rales. Good inspiratory effort. Abdomen: Soft, nontender nondistended with normal bowel sounds x4 quadrants. Muskuloskeletal: No cyanosis, clubbing or edema noted bilaterally Neuro: CN II-XII intact, strength 5/5 x 4, sensation, reflexes intact Skin: No ulcerative lesions noted or rashes Psychiatry: Judgement and insight appear normal. Mood is appropriate for condition and setting          Labs on Admission:  Basic Metabolic Panel: Recent Labs  Lab 02/21/21 1917 02/22/21 2215 02/22/21 2242  NA 127* 128* 128*  K 3.6 3.6 4.1  CL 92* 88* 91*  CO2 27 21*  --    GLUCOSE 455* 753* >700*  BUN 40* 51* 46*  CREATININE 2.85* 3.23* 3.10*  CALCIUM 8.7* 9.0  --    Liver Function Tests: Recent Labs  Lab 02/21/21 1917 02/22/21 2215  AST 17 27  ALT 21 22  ALKPHOS 105 118  BILITOT 0.7 0.8  PROT 6.9 7.6  ALBUMIN 3.5 3.8   No results for input(s): LIPASE, AMYLASE in the last 168 hours. No results for input(s): AMMONIA in the last 168 hours. CBC: Recent Labs  Lab 02/21/21 1917 02/22/21 2215 02/22/21 2242  WBC 4.4 9.9  --   NEUTROABS  --  6.2  --   HGB 12.2 13.1 13.9  HCT 36.2 40.4 41.0  MCV 86.6 89.2  --   PLT 177 208  --    Cardiac Enzymes: No results for input(s): CKTOTAL, CKMB, CKMBINDEX, TROPONINI in the last 168 hours.  BNP (last 3 results) Recent Labs  07/10/20 1351  BNP 748.0*    ProBNP (last 3 results) No results for input(s): PROBNP in the last 8760 hours.  CBG: Recent Labs  Lab 02/23/21 0122 02/23/21 0152 02/23/21 0252 02/23/21 0352 02/23/21 0445  GLUCAP 458* 358* 263* 237* 235*    Radiological Exams on Admission: CT Head Wo Contrast  Result Date: 02/22/2021 CLINICAL DATA:  Unresponsiveness. EXAM: CT HEAD WITHOUT CONTRAST TECHNIQUE: Contiguous axial images were obtained from the base of the skull through the vertex without intravenous contrast. COMPARISON:  Head CT dated 11/05/2017. FINDINGS: Brain: Mild age-related atrophy and chronic microvascular ischemic changes. There is no acute intracranial hemorrhage. No mass effect or midline shift. No extra-axial fluid collection. Vascular: No hyperdense vessel or unexpected calcification. Skull: Normal. Negative for fracture or focal lesion. Sinuses/Orbits: No acute finding. Other: None IMPRESSION: 1. No acute intracranial pathology. 2. Mild age-related atrophy and chronic microvascular ischemic changes. Electronically Signed   By: Anner Crete M.D.   On: 02/22/2021 23:45   DG Chest Port 1 View  Result Date: 02/22/2021 CLINICAL DATA:  Shortness of breath EXAM:  PORTABLE CHEST 1 VIEW COMPARISON:  02/21/2021 FINDINGS: Cardiomegaly. No focal airspace opacity, effusion or edema. No acute bony abnormality. IMPRESSION: Cardiomegaly.  No active disease. Electronically Signed   By: Rolm Baptise M.D.   On: 02/22/2021 23:51   DG Chest Port 1 View  Result Date: 02/21/2021 CLINICAL DATA:  Right side chest pain EXAM: PORTABLE CHEST 1 VIEW COMPARISON:  07/10/2020 FINDINGS: Cardiomegaly. Tortuous aorta. No confluent opacities or effusions. No acute bony abnormality. No overt edema. IMPRESSION: Cardiomegaly, tortuous aorta. No active disease. Electronically Signed   By: Rolm Baptise M.D.   On: 02/21/2021 20:16   CT Renal Stone Study  Result Date: 02/21/2021 CLINICAL DATA:  Right flank pain. EXAM: CT ABDOMEN AND PELVIS WITHOUT CONTRAST TECHNIQUE: Multidetector CT imaging of the abdomen and pelvis was performed following the standard protocol without IV contrast. COMPARISON:  Renal ultrasound 04/05/2020. FINDINGS: Lower chest: There is some scarring or atelectasis in the lung bases. The heart is enlarged. There is a small pericardial effusion. Hepatobiliary: No focal liver abnormality is seen. Status post cholecystectomy. Common bile duct is enlarged measuring 1 cm. Pancreas: Unremarkable. No pancreatic ductal dilatation or surrounding inflammatory changes. Spleen: Normal in size without focal abnormality. Adrenals/Urinary Tract: Adrenal glands are unremarkable. Kidneys are normal, without renal calculi, or hydronephrosis. There are 2 rounded mildly hyperdense areas in the right kidney, indeterminate. Bladder is unremarkable. Stomach/Bowel: The colon is markedly redundant. There is gaseous distention of the sigmoid colon. No definitive transition point visualized. No definite twisting identified. The appendix is not seen. Small bowel and stomach are within normal limits. Vascular/Lymphatic: Aortic atherosclerosis. No enlarged abdominal or pelvic lymph nodes. Reproductive: There is  a catheter visualized in the uterus, indeterminate. Ovary is not well delineated. Other: No abdominal wall hernia or abnormality. No abdominopelvic ascites. Musculoskeletal: Degenerative changes affect the spine. IMPRESSION: 1. No evidence for urinary tract calculus or obstruction. 2. Markedly redundant colon. The sigmoid colon is distended with air. No definite twisting identified he has. If there is high clinical concern for volvulus consider repeat examination with oral contrast. 3. Catheter visualized in the uterus, indeterminate. Recommend clinical correlation and follow-up. 4. Common bile duct is enlarged status post cholecystectomy. Correlate with lab values to exclude distal biliary obstruction. 5. Cardiomegaly with small pericardial effusion. 6. There are 2 rounded mildly hyperdense lesions in the right kidney, indeterminate. These may represent proteinaceous cyst. These can be  further characterized with ultrasound or MRI as clinically appropriate. 7.  Aortic Atherosclerosis (ICD10-I70.0). Electronically Signed   By: Ronney Asters M.D.   On: 02/21/2021 22:58    EKG: I independently viewed the EKG done and my findings are as followed: Sinus or ectopic atrial tachycardia at a rate of 105 bpm  Assessment/Plan Present on Admission:  DKA, type 2 ([name])  Hyperosmolar hyperglycemic state (HHS) ([place])  Hypertensive urgency  Lactic acidosis  Hyponatremia  Active Problems:   DKA, type 2 ([place])   Acute kidney injury superimposed on CKD ([name])   Lactic acidosis   Hyponatremia   Hyperglycemia due to diabetes mellitus ([name])   Hyperosmolar hyperglycemic state (HHS) ([place])   Hypertensive urgency   Seizures ([name])   Acute metabolic encephalopathy   Acute lower UTI   Elevated troponin  Acute metabolic encephalopathy secondary to multifactorial Patient was alert and oriented x3 at baseline, she was only oriented to person at this time This may be due to patient's hyperglycemic state, UTI or  seizures Continue telemetry Continue fall precaution and neurochecks  Anion gap metabolic acidosis secondary to HHS/DKA Hyperglycemia secondary to poorly controlled type 2 diabetes mellitus Anion gap 19; beta hydroxybutyric acid 0.91 Continue insulin drip, IV LR with IV potassium per DKA protocol Transition IV LR to D5 LR when serum glucose reaches 264m/dL Continue serial BMP and VBG Continue to monitor for anion gap closure prior to transitioning patient to subcu insulin Continue NPO  New onset seizures 1000 mg of IV Keppra was given Continue fall precaution, neurochecks, seizure precaution, aspiration precaution EEG will be done in the morning Neurology will be consulted and we shall await further recommendations  Lactic acidosis possibly secondary to above Lactic acid 7.5; continue to trend lactic acid  Hypertensive urgency-resolved Essential hypertension Continue IV hydralazine 10 mg every 6 hours as needed and plan to transition to patient's home meds when she resumes oral intake  UTI POA Patient was started on IV ceftriaxone, we shall continue with same at this time Urine culture pending   Elevated troponin possibly secondary to type II demand ischemia Troponin 19 > 18 > 25; patient denies chest pain EKG personally reviewed shows sinus or ectopic atrial tachycardia at a rate of 105 bpm  Mixed hyperlipidemia Continue home Lipitor when patient resumes oral intake   DVT prophylaxis: Continue Lovenox  Code Status: Full code  Family Communication: None at bedside  Disposition Plan:  Patient is from:                        home Anticipated DC to:                   SNF or family members home Anticipated DC date:               2-3 days Anticipated DC barriers:         Patient requires inpatient management due to altered mental status, HHS, new onset seizures requiring further seizures and pending neurology consult.  Consults called: Neurology  Admission status:  Observation   OBernadette HoitMD [facility]  02/23/2021, 4:50 AM

## 2021-02-23 NOTE — ED Notes (Signed)
Was instructed to monitor. Hospitalist made aware that pt BP keeps rising.  Orders to be given.

## 2021-02-23 NOTE — Procedures (Signed)
Patient Name: CAPRINA WUSSOW  MRN: [id number]  Epilepsy Attending: Lora Havens  Referring Physician/Provider: Bernadette Hoit, DO Date: 02/23/2021 Duration: 24.17 mins  Patient history: 85 year old female with new onset seizure and altered mental status.  EEG for seizure.  Level of alertness: Awake  AEDs during EEG study: LEV  Technical aspects: This EEG study was done with scalp electrodes positioned according to the 10-20 International system of electrode placement. Electrical activity was acquired at a sampling rate of 500Hz  and reviewed with a high frequency filter of 70Hz  and a low frequency filter of 1Hz . EEG data were recorded continuously and digitally stored.   Description: The posterior dominant rhythm consists of 8 Hz activity of moderate voltage (25-35 uV) seen predominantly in posterior head regions, symmetric and reactive to eye opening and eye closing. Hyperventilation and photic stimulation were not performed.     IMPRESSION: This study is within normal limits. No seizures or epileptiform discharges were seen throughout the recording.  Zohan Shiflet Barbra Sarks

## 2021-02-23 NOTE — Progress Notes (Signed)
PROGRESS NOTE   CRESTA RIDEN  OBS:[id number] DOB: 1935-09-17 DOA: 02/22/2021 PCP: Iona Beard, MD   Chief Complaint  Patient presents with   Seizures   Level of care: Telemetry  Brief Admission History:   85 y.o. female with medical history significant for type 2 diabetes mellitus, hypertension, hyperlipidemia, CKD stage IV who presents to the emergency department due to confusion.  Patient was unable to provide history, history was obtained from ED physician and ED medical record.  Per report, patient was reported to be confused while in the bathroom at home, EMS was activated and she was taken to the ED for further evaluation, while at triage, she presented with a witnessed seizure.  Patient was seen in the ED on 10/24 due to back pain and flank pain.  She was treated with pain medication, IV ceftriaxone due to pyelonephritis and patient was discharged home.  There was no report of fever, chills, chest pain, shortness of breath, nausea or vomiting.    Assessment & Plan:   Active Problems:   DKA, type 2 ([place])   Acute kidney injury superimposed on CKD ([place])   Essential hypertension   Lactic acidosis   Hyponatremia   Hyperglycemia due to diabetes mellitus ([name])   Hyperlipidemia   Hyperosmolar hyperglycemic state (HHS) ([place])   Hypertensive urgency   Seizures ([place])   Acute metabolic encephalopathy   Acute lower UTI   Elevated troponin   DKA (diabetic ketoacidosis) ([name])  Hyperosmolar Hyperglycemia  - Pt has responded favorably to IV insulin infusion - Transitioned this morning to subcutaneous insulin  - monitor CBG closely  - restarted diet / carb modified  Ambulate / Out of bed and PT evaluation ordered  New Onset Seizure Activity - this was witnessed in the ED  - no known cause  - loaded with keppra in ED - EEG ordered - Inpatient neurology consult ordered   Lactic acidosis  - treated with IV fluids and has come down to 2.0  Hypertensive urgency  - BP remains  elevated but slowly trending down - no need to rapidly lower BP now.  - added metoprolol 12.5 mg BID - resumed home hydralazine 100 mg BID  - follow  UTI - continue ceftriaxone pending urine culture and sensitivities  Metabolic encephalopathy  - improved with treatment and supportive measures - possibly patient has been post-ictal from seizure activity  Mixed hyperlipidemia  - resumed home atorvastatin   DVT prophylaxis: enoxaparin  Code Status: Full  Family Communication: patient updated  Disposition: anticipating DC home  Status is: Inpatient  Remains inpatient appropriate because: Pt being worked up for new onset seizures needing inpatient neurology consultation    Consultants:  Neurology   Procedures:  EEG   Antimicrobials:  Ceftriaxone 10/16>>   Subjective: Pt reports no headache, no chest pain, no vision change, wants to eat breakfast.   Objective: Vitals:   02/23/21 1245 02/23/21 1300 02/23/21 1315 02/23/21 1330  BP: (!) 169/116 (!) 183/103 (!) 144/108 (!) 171/107  Pulse: 90 94 (!) 118 75  Resp: (!) 26 18 (!) 27 (!) 26  Temp:      TempSrc:      SpO2: 99% 100% (!) 87% 99%  Weight:      Height:        Intake/Output Summary (Last 24 hours) at 02/23/2021 1430 Last data filed at 02/23/2021 0355 Gross per 24 hour  Intake 2874.41 ml  Output --  Net 2874.41 ml   [employer]  02/22/21 2214  Weight: 67 kg    Examination:  General exam: elderly, frail, female, awake, alert, Appears calm and comfortable  Respiratory system: Clear to auscultation. Respiratory effort normal. Cardiovascular system: normal S1 & S2 heard. No JVD, murmurs, rubs, gallops or clicks. No pedal edema. Gastrointestinal system: Abdomen is nondistended, soft and nontender. No organomegaly or masses felt. Normal bowel sounds heard. Central nervous system: Alert and oriented. No focal neurological deficits. Extremities: Symmetric 5 x 5 power. Skin: No rashes, lesions or  ulcers Psychiatry: Judgement and insight appear normal. Mood & affect appropriate.   Data Reviewed: I have personally reviewed following labs and imaging studies  CBC: Recent Labs  Lab 02/21/21 1917 02/22/21 2215 02/22/21 2242 02/23/21 0524  WBC 4.4 9.9  --  8.1  NEUTROABS  --  6.2  --   --   HGB 12.2 13.1 13.9 11.8*  HCT 36.2 40.4 41.0 35.6*  MCV 86.6 89.2  --  87.9  PLT 177 208  --  774    Basic Metabolic Panel: Recent Labs  Lab 02/21/21 1917 02/22/21 2215 02/22/21 2242 02/23/21 0524  NA 127* 128* 128* 133*  K 3.6 3.6 4.1 3.6  CL 92* 88* 91* 97*  CO2 27 21*  --  25  GLUCOSE 455* 753* >700* 225*  BUN 40* 51* 46* 45*  CREATININE 2.85* 3.23* 3.10* 2.65*  CALCIUM 8.7* 9.0  --  8.7*  MG  --   --   --  1.9  PHOS  --   --   --  2.5    GFR: Estimated Creatinine Clearance: 16.8 mL/min (A) (by C-G formula based on SCr of 2.65 mg/dL (H)).  Liver Function Tests: Recent Labs  Lab 02/21/21 1917 02/22/21 2215 02/23/21 0524  AST 17 27 19   ALT 21 22 18   ALKPHOS 105 118 94  BILITOT 0.7 0.8 0.4  PROT 6.9 7.6 6.2*  ALBUMIN 3.5 3.8 3.2*    CBG: Recent Labs  Lab 02/23/21 0445 02/23/21 0558 02/23/21 0748 02/23/21 0905 02/23/21 1215  GLUCAP 235* 184* 165* 148* 74    Recent Results (from the past 240 hour(s))  Urine Culture     Status: Abnormal   Collection Time: 02/21/21  8:12 PM   Specimen: Urine, Clean Catch  Result Value Ref Range Status   Specimen Description   Final    URINE, CLEAN CATCH Performed at [facility], [address] [place], [place] [address]    Special Requests   Final    NONE Performed at [facility], [address] [place], [place] [address]    Culture (A)  Final    10,000 COLONIES/mL MULTIPLE SPECIES PRESENT, SUGGEST RECOLLECTION   Report Status 02/23/2021 FINAL  Final  Resp Panel by RT-PCR (Flu A&B, Covid) Nasopharyngeal Swab     Status: None   Collection Time: 02/22/21 10:56 PM   Specimen: Nasopharyngeal Swab; Nasopharyngeal(NP)  swabs in vial transport medium  Result Value Ref Range Status   SARS Coronavirus 2 by RT PCR NEGATIVE NEGATIVE Final    Comment: (NOTE) SARS-CoV-2 target nucleic acids are NOT DETECTED.  The SARS-CoV-2 RNA is generally detectable in upper respiratory specimens during the acute phase of infection. The lowest concentration of SARS-CoV-2 viral copies this assay can detect is 138 copies/mL. A negative result does not preclude SARS-Cov-2 infection and should not be used as the sole basis for treatment or other patient management decisions. A negative result may occur with  improper specimen collection/handling, submission of specimen other  than nasopharyngeal swab, presence of viral mutation(s) within the areas targeted by this assay, and inadequate number of viral copies(<138 copies/mL). A negative result must be combined with clinical observations, patient history, and epidemiological information. The expected result is Negative.  Fact Sheet for Patients:  EntrepreneurPulse.com.au  Fact Sheet for Healthcare Providers:  IncredibleEmployment.be  This test is no t yet approved or cleared by the [place] FDA and  has been authorized for detection and/or diagnosis of SARS-CoV-2 by FDA under an Emergency Use Authorization (EUA). This EUA will remain  in effect (meaning this test can be used) for the duration of the COVID-19 declaration under Section 564(b)(1) of the Act, 21 U.S.C.section 360bbb-3(b)(1), unless the authorization is terminated  or revoked sooner.       Influenza A by PCR NEGATIVE NEGATIVE Final   Influenza B by PCR NEGATIVE NEGATIVE Final    Comment: (NOTE) The Xpert Xpress SARS-CoV-2/FLU/RSV plus assay is intended as an aid in the diagnosis of influenza from Nasopharyngeal swab specimens and should not be used as a sole basis for treatment. Nasal washings and aspirates are unacceptable for Xpert Xpress  SARS-CoV-2/FLU/RSV testing.  Fact Sheet for Patients: EntrepreneurPulse.com.au  Fact Sheet for Healthcare Providers: IncredibleEmployment.be  This test is not yet approved or cleared by the [place] FDA and has been authorized for detection and/or diagnosis of SARS-CoV-2 by FDA under an Emergency Use Authorization (EUA). This EUA will remain in effect (meaning this test can be used) for the duration of the COVID-19 declaration under Section 564(b)(1) of the Act, 21 U.S.C. section 360bbb-3(b)(1), unless the authorization is terminated or revoked.  Performed at [facility], [address] [place], [place] [address]      Radiology Studies: CT Head Wo Contrast  Result Date: 02/22/2021 CLINICAL DATA:  Unresponsiveness. EXAM: CT HEAD WITHOUT CONTRAST TECHNIQUE: Contiguous axial images were obtained from the base of the skull through the vertex without intravenous contrast. COMPARISON:  Head CT dated 11/05/2017. FINDINGS: Brain: Mild age-related atrophy and chronic microvascular ischemic changes. There is no acute intracranial hemorrhage. No mass effect or midline shift. No extra-axial fluid collection. Vascular: No hyperdense vessel or unexpected calcification. Skull: Normal. Negative for fracture or focal lesion. Sinuses/Orbits: No acute finding. Other: None IMPRESSION: 1. No acute intracranial pathology. 2. Mild age-related atrophy and chronic microvascular ischemic changes. Electronically Signed   By: Anner Crete M.D.   On: 02/22/2021 23:45   DG Chest Port 1 View  Result Date: 02/22/2021 CLINICAL DATA:  Shortness of breath EXAM: PORTABLE CHEST 1 VIEW COMPARISON:  02/21/2021 FINDINGS: Cardiomegaly. No focal airspace opacity, effusion or edema. No acute bony abnormality. IMPRESSION: Cardiomegaly.  No active disease. Electronically Signed   By: Rolm Baptise M.D.   On: 02/22/2021 23:51   DG Chest Port 1 View  Result Date: 02/21/2021 CLINICAL  DATA:  Right side chest pain EXAM: PORTABLE CHEST 1 VIEW COMPARISON:  07/10/2020 FINDINGS: Cardiomegaly. Tortuous aorta. No confluent opacities or effusions. No acute bony abnormality. No overt edema. IMPRESSION: Cardiomegaly, tortuous aorta. No active disease. Electronically Signed   By: Rolm Baptise M.D.   On: 02/21/2021 20:16   CT Renal Stone Study  Result Date: 02/21/2021 CLINICAL DATA:  Right flank pain. EXAM: CT ABDOMEN AND PELVIS WITHOUT CONTRAST TECHNIQUE: Multidetector CT imaging of the abdomen and pelvis was performed following the standard protocol without IV contrast. COMPARISON:  Renal ultrasound 04/05/2020. FINDINGS: Lower chest: There is some scarring or atelectasis in the lung bases. The heart is enlarged. There  is a small pericardial effusion. Hepatobiliary: No focal liver abnormality is seen. Status post cholecystectomy. Common bile duct is enlarged measuring 1 cm. Pancreas: Unremarkable. No pancreatic ductal dilatation or surrounding inflammatory changes. Spleen: Normal in size without focal abnormality. Adrenals/Urinary Tract: Adrenal glands are unremarkable. Kidneys are normal, without renal calculi, or hydronephrosis. There are 2 rounded mildly hyperdense areas in the right kidney, indeterminate. Bladder is unremarkable. Stomach/Bowel: The colon is markedly redundant. There is gaseous distention of the sigmoid colon. No definitive transition point visualized. No definite twisting identified. The appendix is not seen. Small bowel and stomach are within normal limits. Vascular/Lymphatic: Aortic atherosclerosis. No enlarged abdominal or pelvic lymph nodes. Reproductive: There is a catheter visualized in the uterus, indeterminate. Ovary is not well delineated. Other: No abdominal wall hernia or abnormality. No abdominopelvic ascites. Musculoskeletal: Degenerative changes affect the spine. IMPRESSION: 1. No evidence for urinary tract calculus or obstruction. 2. Markedly redundant colon. The  sigmoid colon is distended with air. No definite twisting identified he has. If there is high clinical concern for volvulus consider repeat examination with oral contrast. 3. Catheter visualized in the uterus, indeterminate. Recommend clinical correlation and follow-up. 4. Common bile duct is enlarged status post cholecystectomy. Correlate with lab values to exclude distal biliary obstruction. 5. Cardiomegaly with small pericardial effusion. 6. There are 2 rounded mildly hyperdense lesions in the right kidney, indeterminate. These may represent proteinaceous cyst. These can be further characterized with ultrasound or MRI as clinically appropriate. 7.  Aortic Atherosclerosis (ICD10-I70.0). Electronically Signed   By: Ronney Asters M.D.   On: 02/21/2021 22:58    Scheduled Meds:  aspirin EC  81 mg Oral Daily   atorvastatin  80 mg Oral QHS   enoxaparin (LOVENOX) injection  30 mg Subcutaneous Q24H   hydrALAZINE  100 mg Oral BID   insulin aspart  0-5 Units Subcutaneous QHS   insulin aspart  0-9 Units Subcutaneous TID WC   insulin aspart  6 Units Subcutaneous TID WC   [START ON 02/24/2021] insulin glargine-yfgn  25 Units Subcutaneous Q24H   metoprolol tartrate  12.5 mg Oral BID   Continuous Infusions:  [START ON 02/24/2021] cefTRIAXone (ROCEPHIN)  IV     lactated ringers 70 mL/hr at 02/23/21 0758     LOS: 0 days   Time spent: 35 mins   Aariana Shankland Wynetta Emery, MD How to contact the [facility] Attending or Consulting provider [place] or covering provider during after hours [place], for this patient?  Check the care team in [facility] and look for a) attending/consulting [facility] provider listed and b) the [facility] team listed Log into [email or web address] and use [place]'s universal password to access. If you do not have the password, please contact the hospital operator. Locate the [facility] provider you are looking for under [facility] and page to a number that you can be directly reached. If you still have difficulty reaching  the provider, please page the [facility] (Director on Call) for the Hospitalists listed on amion for assistance.  02/23/2021, 2:30 PM

## 2021-02-23 NOTE — Progress Notes (Signed)
EEG complete - results pending 

## 2021-02-23 NOTE — ED Notes (Signed)
MD made aware of increased BP's overnight and this AM. Informed him that PRN Hydralazine had been ineffective overnight, MD advised to give again. Will cont to monitor.

## 2021-02-23 NOTE — ED Notes (Addendum)
Pt blood glucose is 125. Endotool displayed to contact physician. Hopitalist said to wait for next BMP and beta hydrox

## 2021-02-23 NOTE — ED Notes (Signed)
Snack given for blood sugar 74

## 2021-02-24 ENCOUNTER — Encounter (HOSPITAL_COMMUNITY): Payer: Self-pay | Admitting: Family Medicine

## 2021-02-24 DIAGNOSIS — G9341 Metabolic encephalopathy: Secondary | ICD-10-CM | POA: Diagnosis not present

## 2021-02-24 DIAGNOSIS — N179 Acute kidney failure, unspecified: Secondary | ICD-10-CM | POA: Diagnosis not present

## 2021-02-24 DIAGNOSIS — L8990 Pressure ulcer of unspecified site, unspecified stage: Secondary | ICD-10-CM | POA: Insufficient documentation

## 2021-02-24 DIAGNOSIS — E1110 Type 2 diabetes mellitus with ketoacidosis without coma: Secondary | ICD-10-CM | POA: Diagnosis not present

## 2021-02-24 DIAGNOSIS — N390 Urinary tract infection, site not specified: Secondary | ICD-10-CM | POA: Diagnosis not present

## 2021-02-24 LAB — BASIC METABOLIC PANEL
Anion gap: 9 (ref 5–15)
BUN: 43 mg/dL — ABNORMAL HIGH (ref 8–23)
CO2: 26 mmol/L (ref 22–32)
Calcium: 8.8 mg/dL — ABNORMAL LOW (ref 8.9–10.3)
Chloride: 100 mmol/L (ref 98–111)
Creatinine, Ser: 2.45 mg/dL — ABNORMAL HIGH (ref 0.44–1.00)
GFR, Estimated: 19 mL/min — ABNORMAL LOW (ref >60)
Glucose, Bld: 109 mg/dL — ABNORMAL HIGH (ref 70–99)
Potassium: 3.4 mmol/L — ABNORMAL LOW (ref 3.5–5.1)
Sodium: 135 mmol/L (ref 135–145)

## 2021-02-24 LAB — GLUCOSE, CAPILLARY
Glucose-Capillary: 84 mg/dL (ref 70–99)
Glucose-Capillary: 103 mg/dL — ABNORMAL HIGH (ref 70–99)
Glucose-Capillary: 294 mg/dL — ABNORMAL HIGH (ref 70–99)

## 2021-02-24 MED ORDER — FUROSEMIDE 40 MG PO TABS: 40.0000 mg | ORAL_TABLET | Freq: Every day | ORAL | Status: DC

## 2021-02-24 MED ORDER — POTASSIUM CHLORIDE CRYS ER 20 MEQ PO TBCR
40.0000 meq | EXTENDED_RELEASE_TABLET | Freq: Once | ORAL | Status: AC
  Administered 2021-02-24: 40 meq via ORAL
  Filled 2021-02-24: qty 2

## 2021-02-24 MED ORDER — CEFDINIR 300 MG PO CAPS: 300.0000 mg | ORAL_CAPSULE | Freq: Every day | ORAL | 0 refills | Status: AC

## 2021-02-24 MED ORDER — METOPROLOL TARTRATE 25 MG PO TABS
25.0000 mg | ORAL_TABLET | Freq: Two times a day (BID) | ORAL | Status: DC
  Administered 2021-02-24: 25 mg via ORAL

## 2021-02-24 MED ORDER — METOPROLOL TARTRATE 25 MG PO TABS: 25.0000 mg | ORAL_TABLET | Freq: Two times a day (BID) | ORAL | 1 refills | Status: DC

## 2021-02-24 MED ORDER — INSULIN ASPART 100 UNIT/ML FLEXPEN: 6.0000 [IU] | PEN_INJECTOR | Freq: Three times a day (TID) | SUBCUTANEOUS | 3 refills | Status: DC

## 2021-02-24 NOTE — Progress Notes (Signed)
Pt was taken downstairs via wheelchair by staff where pt's daughter was awaiting for transport.

## 2021-02-24 NOTE — Progress Notes (Signed)
02/24/2021 12:59 PM  I reached out to [facility] and was told leave INP and no need to change to OBV or do code 44.    Murvin Natal, MD

## 2021-02-24 NOTE — Evaluation (Addendum)
Physical Therapy Evaluation Patient Details Name: Chelsea Meza MRN: [id number] DOB: 1936-01-27 Today's Date: 02/24/2021  History of Present Illness  Chelsea Meza is a 85 y.o. female with medical history significant for type 2 diabetes mellitus, hypertension, hyperlipidemia, CKD stage IV who presents to the emergency department due to confusion.  Patient was unable to provide history, history was obtained from ED physician and ED medical record.  Per report, patient was reported to be confused while in the bathroom at home, EMS was activated and she was taken to the ED for further evaluation, while at triage, she presented with a witnessed seizure.  Patient was seen in the ED on 10/24 due to back pain and flank pain.  She was treated with pain medication, IV ceftriaxone due to pyelonephritis and patient was discharged home.  There was no report of fever, chills, chest pain, shortness of breath, nausea or vomiting   Clinical Impression  Patient in bed awake, alert, and agreeable for therapy. Patient required min assist/supervision for  bed mobility and transfers. Slight unsteadiness during ambulation using cane, using the opposite hand to reach for the wall and other objects to hold onto for stability, no loss of balance, slow cadence, but safer using RW. Patient demonstrated occasional mild unsteadiness using RW, no loss of balance, slow cadence and encouraged to use her walker at home. PLAN:  Patient to be discharged home today and discharged from acute physical therapy with recommendations stated below. Patient discharged to care of nursing for ambulation daily as tolerated for length of stay.    Recommendations for follow up therapy are one component of a multi-disciplinary discharge planning process, led by the attending physician.  Recommendations may be updated based on patient status, additional functional criteria and insurance authorization.  Follow Up Recommendations Home health  PT    Assistance Recommended at Discharge Intermittent Supervision/Assistance  Functional Status Assessment    Equipment Recommendations  Rolling walker (2 wheels)    Recommendations for Other Services       Precautions / Restrictions Precautions Precautions: Fall Restrictions Weight Bearing Restrictions: No      Mobility  Bed Mobility Overal bed mobility: Needs Assistance Bed Mobility: Supine to Sit;Rolling Rolling: Modified independent (Device/Increase time) Sidelying to sit: Modified independent (Device/Increase time) Supine to sit: Modified independent (Device/Increase time)     General bed mobility comments: increased time, HOB flat    Transfers Overall transfer level: Needs assistance Equipment used: Straight cane Transfers: Sit to/from [facility] Sit to Stand: Modified independent (Device/Increase time);Supervision Stand pivot transfers: Supervision;Modified independent (Device/Increase time)         General transfer comment: increased time, occasional slight unsteadiness    Ambulation/Gait Ambulation/Gait assistance: Supervision;Min guard Gait Distance (Feet): 55 Feet Assistive device: Rolling walker (2 wheels) Gait Pattern/deviations: Decreased step length - right;Decreased step length - left;Decreased stride length;Narrow base of support Gait velocity: Decreased   General Gait Details: slow cadence, slight unsteadiness w/ gait using cane patient reaching for wall and other objects for stability, no loss of balance; Slow cadence, occasional slight unsteadiness w/ gait using RW  Stairs            Wheelchair Mobility    Modified Rankin (Stroke Patients Only)       Balance Overall balance assessment: Needs assistance Sitting-balance support: No upper extremity supported;Feet supported Sitting balance-Leahy Scale: Good Sitting balance - Comments: at EOB   Standing balance support: Bilateral upper extremity supported;During  functional activity Standing balance-Leahy Scale: [place] Standing  balance comment: fair/good using RW                             Pertinent Vitals/Pain      Home Living Family/patient expects to be discharged to:: Private residence Living Arrangements: Children Available Help at Discharge: Family;Available PRN/intermittently Type of Home: House Home Access: Stairs to enter Entrance Stairs-Rails: Right Entrance Stairs-Number of Steps: 2   Home Layout: One level Home Equipment: Cane - single point;BSC;Grab bars - tub/shower Additional Comments: Information per patient, may be poor historian, inconsistant w/ answers provided    Prior Function Prior Level of Function : Needs assist             Mobility Comments: Patient reports using a SPC during household and community ambulation, does not drive ADLs Comments: assist from daughter as needed     Hand Dominance        Extremity/Trunk Assessment   Upper Extremity Assessment Upper Extremity Assessment: Overall WFL for tasks assessed    Lower Extremity Assessment Lower Extremity Assessment: Generalized weakness       Communication   Communication: No difficulties  Cognition Arousal/Alertness: Awake/alert Behavior During Therapy: WFL for tasks assessed/performed Overall Cognitive Status: Within Functional Limits for tasks assessed                                 General Comments: Some answers to questions were inconsistent with previous answers provided        General Comments      Exercises     Assessment/Plan    PT Assessment All further PT needs can be met in the next venue of care  PT Problem List Decreased strength;Decreased mobility;Decreased safety awareness;Decreased range of motion;Decreased coordination;Decreased activity tolerance;Decreased balance;Decreased knowledge of use of DME       PT Treatment Interventions DME instruction;Therapeutic exercise;Gait  training;Balance training;Stair training;Functional mobility training;Therapeutic activities;Patient/family education    PT Goals (Current goals can be found in the Care Plan section)  Acute Rehab PT Goals Patient Stated Goal: Return home w/ daughter PT Goal Formulation: With patient Time For Goal Achievement: 02/24/21 Potential to Achieve Goals: Good       Barriers to discharge        Co-evaluation               AM-PAC PT "6 Clicks" Mobility  Outcome Measure Help needed turning from your back to your side while in a flat bed without using bedrails?: None Help needed moving from lying on your back to sitting on the side of a flat bed without using bedrails?: None Help needed moving to and from a bed to a chair (including a wheelchair)?: A Little Help needed standing up from a chair using your arms (e.g., wheelchair or bedside chair)?: A Little Help needed to walk in hospital room?: A Little Help needed climbing 3-5 steps with a railing? : A Lot 6 Click Score: 19    End of Session Equipment Utilized During Treatment: Gait belt Activity Tolerance: Patient tolerated treatment well;Patient limited by fatigue Patient left: in chair;with call bell/phone within reach;with chair alarm set Nurse Communication: Mobility status PT Visit Diagnosis: Unsteadiness on feet (R26.81);Other abnormalities of gait and mobility (R26.89);Muscle weakness (generalized) (M62.81)    Time: 6314-9702 PT Time Calculation (min) (ACUTE ONLY): 22 min   Charges:   PT Evaluation $PT Eval Moderate Complexity: 1 Mod PT  Treatments $Therapeutic Activity: 8-22 mins        Cassie Jones, SPT   During this treatment session, the therapist was present, participating in and directing the treatment.  2:53 PM, 02/24/21 [name], MPT Physical Therapist with [facility] [phone] office [phone] mobile phone

## 2021-02-24 NOTE — Discharge Instructions (Signed)
IMPORTANT INFORMATION: PAY CLOSE ATTENTION  ? ?PHYSICIAN DISCHARGE INSTRUCTIONS ? ?Follow with Primary care provider  Hill, Gerald, MD  and other consultants as instructed by your Hospitalist Physician ? ?SEEK MEDICAL CARE OR RETURN TO EMERGENCY ROOM IF SYMPTOMS COME BACK, WORSEN OR NEW PROBLEM DEVELOPS  ? ?Please note: ?You were cared for by a hospitalist during your hospital stay. Every effort will be made to forward records to your primary care provider.  You can request that your primary care provider send for your hospital records if they have not received them.  Once you are discharged, your primary care physician will handle any further medical issues. Please note that NO REFILLS for any discharge medications will be authorized once you are discharged, as it is imperative that you return to your primary care physician (or establish a relationship with a primary care physician if you do not have one) for your post hospital discharge needs so that they can reassess your need for medications and monitor your lab values. ? ?Please get a complete blood count and chemistry panel checked by your Primary MD at your next visit, and again as instructed by your Primary MD. ? ?Get Medicines reviewed and adjusted: ?Please take all your medications with you for your next visit with your Primary MD ? ?Laboratory/radiological data: ?Please request your Primary MD to go over all hospital tests and procedure/radiological results at the follow up, please ask your primary care provider to get all Hospital records sent to his/her office. ? ?In some cases, they will be blood work, cultures and biopsy results pending at the time of your discharge. Please request that your primary care provider follow up on these results. ? ?If you are diabetic, please bring your blood sugar readings with you to your follow up appointment with primary care.   ? ?Please call and make your follow up appointments as soon as possible.   ? ?Also Note the  following: ?If you experience worsening of your admission symptoms, develop shortness of breath, life threatening emergency, suicidal or homicidal thoughts you must seek medical attention immediately by calling 911 or calling your MD immediately  if symptoms less severe. ? ?You must read complete instructions/literature along with all the possible adverse reactions/side effects for all the Medicines you take and that have been prescribed to you. Take any new Medicines after you have completely understood and accpet all the possible adverse reactions/side effects.  ? ?Do not drive when taking Pain medications or sleeping medications (Benzodiazepines) ? ?Do not take more than prescribed Pain, Sleep and Anxiety Medications. It is not advisable to combine anxiety,sleep and pain medications without talking with your primary care practitioner ? ?Special Instructions: If you have smoked or chewed Tobacco  in the last 2 yrs please stop smoking, stop any regular Alcohol  and or any Recreational drug use. ? ?Wear Seat belts while driving.  Do not drive if taking any narcotic, mind altering or controlled substances or recreational drugs or alcohol.  ? ? ? ? ? ?

## 2021-02-24 NOTE — Evaluation (Signed)
Clinical/Bedside Swallow Evaluation Patient Details  Name: Chelsea Meza MRN: [id number] Date of Birth: April 30, 1936  Today's Date: 02/24/2021 Time: SLP Start Time (ACUTE ONLY): 1330 SLP Stop Time (ACUTE ONLY): 1353 SLP Time Calculation (min) (ACUTE ONLY): 23 min  Past Medical History:  Past Medical History:  Diagnosis Date   Anemia    CKD (chronic kidney disease) stage 3, GFR 30-59 ml/min ([name])    Diabetes mellitus without complication ([name])    Hyperlipidemia    Hypertension    TIA (transient ischemic attack)    Past Surgical History:  Past Surgical History:  Procedure Laterality Date   CHOLECYSTECTOMY     HPI:  85 y.o. female with medical history significant for type 2 diabetes mellitus, hypertension, hyperlipidemia, CKD stage IV who presents to the emergency department due to confusion.  Patient was unable to provide history, history was obtained from ED physician and ED medical record.  Per report, patient was reported to be confused while in the bathroom at home, EMS was activated and she was taken to the ED for further evaluation, while at triage, she presented with a witnessed seizure.  Patient was seen in the ED on 10/24 due to back pain and flank pain.  She was treated with pain medication, IV ceftriaxone due to pyelonephritis and patient was discharged home.  There was no report of fever, chills, chest pain, shortness of breath, nausea or vomiting.    Assessment / Plan / Recommendation  Clinical Impression  Clincial swallow evaluation completed in room with Pt sitting up in chair. Oral motor examination is WNL, Pt with U/L dentures. Pt assessed with thin water via straw sips, puree, and mech soft textures. Pt without signs or symptoms of aspiration and no reports of globus. She indicates occasional trouble swallowing all of her pills at once, and splits them up. Continue diet as ordered and SLP to sign off. SLP Visit Diagnosis: Dysphagia, unspecified (R13.10)    Aspiration  Risk  No limitations    Diet Recommendation Dysphagia 3 (Mech soft);Regular;Thin liquid   Liquid Administration via: Cup;Straw Medication Administration: Whole meds with liquid Supervision: Patient able to self feed Postural Changes: Seated upright at 90 degrees;Remain upright for at least 30 minutes after po intake    Other  Recommendations Oral Care Recommendations: Oral care BID Other Recommendations: Clarify dietary restrictions    Recommendations for follow up therapy are one component of a multi-disciplinary discharge planning process, led by the attending physician.  Recommendations may be updated based on patient status, additional functional criteria and insurance authorization.  Follow up Recommendations None      Frequency and Duration            Prognosis Prognosis for Safe Diet Advancement: Good      Swallow Study   General Date of Onset: 02/22/21 HPI: 85 y.o. female with medical history significant for type 2 diabetes mellitus, hypertension, hyperlipidemia, CKD stage IV who presents to the emergency department due to confusion.  Patient was unable to provide history, history was obtained from ED physician and ED medical record.  Per report, patient was reported to be confused while in the bathroom at home, EMS was activated and she was taken to the ED for further evaluation, while at triage, she presented with a witnessed seizure.  Patient was seen in the ED on 10/24 due to back pain and flank pain.  She was treated with pain medication, IV ceftriaxone due to pyelonephritis and patient was discharged home.  There was  no report of fever, chills, chest pain, shortness of breath, nausea or vomiting. Type of Study: Bedside Swallow Evaluation Diet Prior to this Study: Dysphagia 3 (soft);Thin liquids Temperature Spikes Noted: No Respiratory Status: Room air History of Recent Intubation: No Behavior/Cognition: Alert;Cooperative;Pleasant mood Oral Cavity Assessment: Within  Functional Limits Oral Care Completed by SLP: No Oral Cavity - Dentition: Dentures, top;Dentures, bottom Vision: Functional for self-feeding Self-Feeding Abilities: Able to feed self Patient Positioning: Upright in chair Baseline Vocal Quality: Normal Volitional Cough: Strong Volitional Swallow: Able to elicit    Oral/Motor/Sensory Function Overall Oral Motor/Sensory Function: Within functional limits   Ice Chips Ice chips: Within functional limits Presentation: Spoon   Thin Liquid Thin Liquid: Within functional limits Presentation: Cup;Straw;Self Fed    Nectar Thick Nectar Thick Liquid: Not tested   Honey Thick Honey Thick Liquid: Not tested   Puree Puree: Within functional limits Presentation: Spoon   Solid     Solid: Within functional limits Presentation: Self Fed     Thank you,  Chelsea Meza, Chelsea Meza  Chelsea Meza 02/24/2021,1:57 PM

## 2021-02-24 NOTE — Discharge Summary (Signed)
Physician Discharge Summary  ALEIA LAROCCA PYK:[id number] DOB: 01/09/1936 DOA: 02/22/2021  PCP: Iona Beard, MD  Admit date: 02/22/2021 Discharge date: 02/24/2021  Admitted From:  HOME  Disposition:  HOME  Recommendations for Outpatient Follow-up:  Follow up with PCP in 1 weeks  Discharge Condition: STABLE   CODE STATUS: FULL  DIET: Dysphagia 3 soft , thin liquid, heart healthy carb modified recommended    Brief Hospitalization Summary: Please see all hospital notes, images, labs for full details of the hospitalization. ADMISSION HPI: Chelsea Meza is a 85 y.o. female with medical history significant for type 2 diabetes mellitus, hypertension, hyperlipidemia, CKD stage IV who presents to the emergency department due to confusion.  Patient was unable to provide history, history was obtained from ED physician and ED medical record.  Per report, patient was reported to be confused while in the bathroom at home, EMS was activated and she was taken to the ED for further evaluation, while at triage, she presented with a witnessed seizure.  Patient was seen in the ED on 10/24 due to back pain and flank pain.  She was treated with pain medication, IV ceftriaxone due to pyelonephritis and patient was discharged home.  There was no report of fever, chills, chest pain, shortness of breath, nausea or vomiting   ED Course:  In the emergency department, she was intermittently tachypneic and tachycardic.  BP was 193/127.  Work-up in the ED showed normal CBC, hyponatremia, elevated troponin, urinalysis was positive for glycosuria, proteinuria, trace leukocytes and rare bacteria.  Lactic acid 7.4, BUN to creatinine 51/3.23 (baseline creatinine at 2.1-2.9), beta hydroxybutyric acid 0.91.  Influenza A, B, SARS coronavirus 2 was negative.  Chest x-ray showed cardiomegaly with no active disease. CT head without contrast showed no acute intracranial pathology.  CT abdomen and pelvis without contrast showed  no evidence for urinary tract calculus or obstruction.  Patient was started on ceftriaxone, Keppra was given due to seizures, IV hydration was provided and patient was started on IV insulin drip per Endo tool.    HOSPITAL COURSE BY PROBLEM LIST    Hyperosmolar Hyperglycemia  - TREATED AND RESOLVED - Pt has responded favorably to IV insulin infusion - Transitioned to subcutaneous insulin  - monitored CBG closely  - restarted diet / carb modified   Ambulate / Out of bed and PT evaluation ordered- Pt agreeable to outpatient PT which is ordered.    New Onset Seizure Activity - thought secondary to hypoglycemia per neurologist - this was witnessed in the ED  - EEG unrevealing - Inpatient neurology consult recommended no further medication     Lactic acidosis  - treated with IV fluids and has come down to 2.0   Hypertensive urgency  - BP remains elevated but better controlled - added metoprolol 25 mg BID - resumed home hydralazine 100 mg BID  - follow up and titrate meds outpatient    UTI - treated   Metabolic encephalopathy - RESOLVED  - improved with treatment and supportive measures - possibly patient has been post-ictal from seizure activity - EEG has been   Mixed hyperlipidemia  - resumed home atorvastatin    DVT prophylaxis: enoxaparin  Code Status: Full  Family Communication: daughter updated at bedside 10/27 Disposition: DC home  Status is: Inpatient  Discharge Diagnoses:  Active Problems:   DKA, type 2 ([place])   Acute kidney injury superimposed on CKD ([place])   Essential hypertension   Lactic acidosis   Hyponatremia   Hyperglycemia  due to diabetes mellitus ([place])   Hyperlipidemia   Hyperosmolar hyperglycemic state (HHS) ([place])   Hypertensive urgency   Seizures ([place])   Acute metabolic encephalopathy   Acute lower UTI   Elevated troponin   DKA (diabetic ketoacidosis) ([place])   Pressure injury of skin   Discharge Instructions: Discharge Instructions      Ambulatory referral to Physical Therapy   Complete by: As directed       Allergies as of 02/24/2021   Not on File      Medication List     STOP taking these medications    carvedilol 6.25 MG tablet Commonly known as: COREG   cephALEXin 500 MG capsule Commonly known as: KEFLEX   insulin lispro 100 UNIT/ML KwikPen Commonly known as: HUMALOG       TAKE these medications    Accu-Chek FastClix Lancets Misc   Accu-Chek Guide test strip Generic drug: glucose blood USE TO TEST TWICE DAILY.AL   Accu-Chek Guide w/Device Kit USE AS DIRECTED.I   aspirin EC 81 MG tablet Take 81 mg by mouth daily.   atorvastatin 80 MG tablet Commonly known as: LIPITOR Take 80 mg by mouth at bedtime.   cefdinir 300 MG capsule Commonly known as: OMNICEF Take 1 capsule (300 mg total) by mouth daily for 2 days. Start taking on: February 25, 2021   furosemide 40 MG tablet Commonly known as: LASIX Take 1 tablet (40 mg total) by mouth daily. Start taking on: February 28, 2021 What changed: These instructions start on February 28, 2021. If you are unsure what to do until then, ask your doctor or other care provider.   Gvoke PFS 0.5 MG/0.1ML Sosy Generic drug: Glucagon Inject 0.5 mg into the skin as needed (low bs).   hydrALAZINE 100 MG tablet Commonly known as: APRESOLINE Take 1 tablet (100 mg total) by mouth 3 (three) times daily. What changed: when to take this   insulin aspart 100 UNIT/ML FlexPen Commonly known as: NOVOLOG Inject 6 Units into the skin 3 (three) times daily with meals. If eats 50% or more of meal.   Lantus SoloStar 100 UNIT/ML Solostar Pen Generic drug: insulin glargine INJECT 25 UNITS INTO THE SKIN DAILY. What changed:  how much to take how to take this when to take this   metoprolol tartrate 25 MG tablet Commonly known as: LOPRESSOR Take 1 tablet (25 mg total) by mouth 2 (two) times daily.   Pen Needles 32G X 4 MM Misc Use as directed   PenTips 32G X 4  MM Misc Generic drug: Insulin Pen Needle USE AS DIRECTED   VITAMIN B-12 CR PO Take 1 tablet by mouth daily.   Vitamin D (Ergocalciferol) 1.25 MG (50000 UNIT) Caps capsule Commonly known as: DRISDOL Take 50,000 Units by mouth once a week.        Follow-up Information     Iona Beard, MD. Schedule an appointment as soon as possible for a visit in 1 week(s).   Specialty: Family Medicine Why: Hospital Follow Up Contact information: [address] [place] [address] [phone]         Arnoldo Lenis, MD .   Specialty: Cardiology Contact information: [address] [place] [address] [phone]                Not on File Allergies as of 02/24/2021   Not on File      Medication List     STOP taking these medications  carvedilol 6.25 MG tablet Commonly known as: COREG   cephALEXin 500 MG capsule Commonly known as: KEFLEX   insulin lispro 100 UNIT/ML KwikPen Commonly known as: HUMALOG       TAKE these medications    Accu-Chek FastClix Lancets Misc   Accu-Chek Guide test strip Generic drug: glucose blood USE TO TEST TWICE DAILY.AL   Accu-Chek Guide w/Device Kit USE AS DIRECTED.I   aspirin EC 81 MG tablet Take 81 mg by mouth daily.   atorvastatin 80 MG tablet Commonly known as: LIPITOR Take 80 mg by mouth at bedtime.   cefdinir 300 MG capsule Commonly known as: OMNICEF Take 1 capsule (300 mg total) by mouth daily for 2 days. Start taking on: February 25, 2021   furosemide 40 MG tablet Commonly known as: LASIX Take 1 tablet (40 mg total) by mouth daily. Start taking on: February 28, 2021 What changed: These instructions start on February 28, 2021. If you are unsure what to do until then, ask your doctor or other care provider.   Gvoke PFS 0.5 MG/0.1ML Sosy Generic drug: Glucagon Inject 0.5 mg into the skin as needed (low bs).   hydrALAZINE 100 MG tablet Commonly known as: APRESOLINE Take 1 tablet (100 mg  total) by mouth 3 (three) times daily. What changed: when to take this   insulin aspart 100 UNIT/ML FlexPen Commonly known as: NOVOLOG Inject 6 Units into the skin 3 (three) times daily with meals. If eats 50% or more of meal.   Lantus SoloStar 100 UNIT/ML Solostar Pen Generic drug: insulin glargine INJECT 25 UNITS INTO THE SKIN DAILY. What changed:  how much to take how to take this when to take this   metoprolol tartrate 25 MG tablet Commonly known as: LOPRESSOR Take 1 tablet (25 mg total) by mouth 2 (two) times daily.   Pen Needles 32G X 4 MM Misc Use as directed   PenTips 32G X 4 MM Misc Generic drug: Insulin Pen Needle USE AS DIRECTED   VITAMIN B-12 CR PO Take 1 tablet by mouth daily.   Vitamin D (Ergocalciferol) 1.25 MG (50000 UNIT) Caps capsule Commonly known as: DRISDOL Take 50,000 Units by mouth once a week.        Procedures/Studies: CT Head Wo Contrast  Result Date: 02/22/2021 CLINICAL DATA:  Unresponsiveness. EXAM: CT HEAD WITHOUT CONTRAST TECHNIQUE: Contiguous axial images were obtained from the base of the skull through the vertex without intravenous contrast. COMPARISON:  Head CT dated 11/05/2017. FINDINGS: Brain: Mild age-related atrophy and chronic microvascular ischemic changes. There is no acute intracranial hemorrhage. No mass effect or midline shift. No extra-axial fluid collection. Vascular: No hyperdense vessel or unexpected calcification. Skull: Normal. Negative for fracture or focal lesion. Sinuses/Orbits: No acute finding. Other: None IMPRESSION: 1. No acute intracranial pathology. 2. Mild age-related atrophy and chronic microvascular ischemic changes. Electronically Signed   By: Anner Crete M.D.   On: 02/22/2021 23:45   DG Chest Port 1 View  Result Date: 02/22/2021 CLINICAL DATA:  Shortness of breath EXAM: PORTABLE CHEST 1 VIEW COMPARISON:  02/21/2021 FINDINGS: Cardiomegaly. No focal airspace opacity, effusion or edema. No acute bony  abnormality. IMPRESSION: Cardiomegaly.  No active disease. Electronically Signed   By: Rolm Baptise M.D.   On: 02/22/2021 23:51   DG Chest Port 1 View  Result Date: 02/21/2021 CLINICAL DATA:  Right side chest pain EXAM: PORTABLE CHEST 1 VIEW COMPARISON:  07/10/2020 FINDINGS: Cardiomegaly. Tortuous aorta. No confluent opacities or effusions. No acute bony abnormality. No  overt edema. IMPRESSION: Cardiomegaly, tortuous aorta. No active disease. Electronically Signed   By: Rolm Baptise M.D.   On: 02/21/2021 20:16   EEG adult  Result Date: 02/23/2021 Lora Havens, MD     02/23/2021  3:58 PM Patient Name: AVY BARLETT MRN: [id number] Epilepsy Attending: Lora Havens Referring Physician/Provider: Bernadette Hoit, DO Date: 02/23/2021 Duration: 24.17 mins Patient history: 85 year old female with new onset seizure and altered mental status.  EEG for seizure. Level of alertness: Awake AEDs during EEG study: LEV Technical aspects: This EEG study was done with scalp electrodes positioned according to the 10-20 International system of electrode placement. Electrical activity was acquired at a sampling rate of _0  and reviewed with a high frequency filter of _1  and a low frequency filter of _2 . EEG data were recorded continuously and digitally stored. Description: The posterior dominant rhythm consists of 8 Hz activity of moderate voltage (25-35 uV) seen predominantly in posterior head regions, symmetric and reactive to eye opening and eye closing. Hyperventilation and photic stimulation were not performed.   IMPRESSION: This study is within normal limits. No seizures or epileptiform discharges were seen throughout the recording. Lora Havens   CT Renal Stone Study  Result Date: 02/21/2021 CLINICAL DATA:  Right flank pain. EXAM: CT ABDOMEN AND PELVIS WITHOUT CONTRAST TECHNIQUE: Multidetector CT imaging of the abdomen and pelvis was performed following the standard protocol without IV  contrast. COMPARISON:  Renal ultrasound 04/05/2020. FINDINGS: Lower chest: There is some scarring or atelectasis in the lung bases. The heart is enlarged. There is a small pericardial effusion. Hepatobiliary: No focal liver abnormality is seen. Status post cholecystectomy. Common bile duct is enlarged measuring 1 cm. Pancreas: Unremarkable. No pancreatic ductal dilatation or surrounding inflammatory changes. Spleen: Normal in size without focal abnormality. Adrenals/Urinary Tract: Adrenal glands are unremarkable. Kidneys are normal, without renal calculi, or hydronephrosis. There are 2 rounded mildly hyperdense areas in the right kidney, indeterminate. Bladder is unremarkable. Stomach/Bowel: The colon is markedly redundant. There is gaseous distention of the sigmoid colon. No definitive transition point visualized. No definite twisting identified. The appendix is not seen. Small bowel and stomach are within normal limits. Vascular/Lymphatic: Aortic atherosclerosis. No enlarged abdominal or pelvic lymph nodes. Reproductive: There is a catheter visualized in the uterus, indeterminate. Ovary is not well delineated. Other: No abdominal wall hernia or abnormality. No abdominopelvic ascites. Musculoskeletal: Degenerative changes affect the spine. IMPRESSION: 1. No evidence for urinary tract calculus or obstruction. 2. Markedly redundant colon. The sigmoid colon is distended with air. No definite twisting identified he has. If there is high clinical concern for volvulus consider repeat examination with oral contrast. 3. Catheter visualized in the uterus, indeterminate. Recommend clinical correlation and follow-up. 4. Common bile duct is enlarged status post cholecystectomy. Correlate with lab values to exclude distal biliary obstruction. 5. Cardiomegaly with small pericardial effusion. 6. There are 2 rounded mildly hyperdense lesions in the right kidney, indeterminate. These may represent proteinaceous cyst. These can be  further characterized with ultrasound or MRI as clinically appropriate. 7.  Aortic Atherosclerosis (ICD10-I70.0). Electronically Signed   By: Ronney Asters M.D.   On: 02/21/2021 22:58     Subjective: Pt is reporting that she is feeling much better and wants to go home.    Discharge Exam: Vitals:   02/24/21 0832 02/24/21 1144  BP: (!) 182/97 (!) 151/100  Pulse:  62  Resp:  16  Temp:  98.2 F (36.8 C)  SpO2:  99%   Vitals:  02/24/21 0318 02/24/21 0726 02/24/21 0832 02/24/21 1144  BP: (!) 160/96 (!) 168/96 (!) 182/97 (!) 151/100  Pulse: 65 70  62  Resp: _0 Temp: 98.2 F (36.8 C) 97.9 F (36.6 C)  98.2 F (36.8 C)  TempSrc:      SpO2: 98% 99%  99%  Weight:      Height:       General: Pt is alert, awake, not in acute distress Cardiovascular: normal S1/S2 +, no rubs, no gallops Respiratory: CTA bilaterally, no wheezing, no rhonchi Abdominal: Soft, NT, ND, bowel sounds + Extremities: no edema, no cyanosis   The results of significant diagnostics from this hospitalization (including imaging, microbiology, ancillary and laboratory) are listed below for reference.     Microbiology: Recent Results (from the past 240 hour(s))  Urine Culture     Status: Abnormal   Collection Time: 02/21/21  8:12 PM   Specimen: Urine, Clean Catch  Result Value Ref Range Status   Specimen Description   Final    URINE, CLEAN CATCH Performed at [facility], [address] [place], [place] [address]    Special Requests   Final    NONE Performed at [facility], [address] [place], [place] [address]    Culture (A)  Final    10,000 COLONIES/mL MULTIPLE SPECIES PRESENT, SUGGEST RECOLLECTION   Report Status 02/23/2021 FINAL  Final  Resp Panel by RT-PCR (Flu A&B, Covid) Nasopharyngeal Swab     Status: None   Collection Time: 02/22/21 10:56 PM   Specimen: Nasopharyngeal Swab; Nasopharyngeal(NP) swabs in vial transport medium  Result Value Ref Range Status   SARS Coronavirus 2 by RT  PCR NEGATIVE NEGATIVE Final    Comment: (NOTE) SARS-CoV-2 target nucleic acids are NOT DETECTED.  The SARS-CoV-2 RNA is generally detectable in upper respiratory specimens during the acute phase of infection. The lowest concentration of SARS-CoV-2 viral copies this assay can detect is 138 copies/mL. A negative result does not preclude SARS-Cov-2 infection and should not be used as the sole basis for treatment or other patient management decisions. A negative result may occur with  improper specimen collection/handling, submission of specimen other than nasopharyngeal swab, presence of viral mutation(s) within the areas targeted by this assay, and inadequate number of viral copies(<138 copies/mL). A negative result must be combined with clinical observations, patient history, and epidemiological information. The expected result is Negative.  Fact Sheet for Patients:  EntrepreneurPulse.com.au  Fact Sheet for Healthcare Providers:  IncredibleEmployment.be  This test is no t yet approved or cleared by the [place] FDA and  has been authorized for detection and/or diagnosis of SARS-CoV-2 by FDA under an Emergency Use Authorization (EUA). This EUA will remain  in effect (meaning this test can be used) for the duration of the COVID-19 declaration under Section 564(b)(1) of the Act, 21 U.S.C.section 360bbb-3(b)(1), unless the authorization is terminated  or revoked sooner.       Influenza A by PCR NEGATIVE NEGATIVE Final   Influenza B by PCR NEGATIVE NEGATIVE Final    Comment: (NOTE) The Xpert Xpress SARS-CoV-2/FLU/RSV plus assay is intended as an aid in the diagnosis of influenza from Nasopharyngeal swab specimens and should not be used as a sole basis for treatment. Nasal washings and aspirates are unacceptable for Xpert Xpress SARS-CoV-2/FLU/RSV testing.  Fact Sheet for Patients: EntrepreneurPulse.com.au  Fact Sheet for  Healthcare Providers: IncredibleEmployment.be  This test is not yet approved or cleared by the [place] FDA and has  been authorized for detection and/or diagnosis of SARS-CoV-2 by FDA under an Emergency Use Authorization (EUA). This EUA will remain in effect (meaning this test can be used) for the duration of the COVID-19 declaration under Section 564(b)(1) of the Act, 21 U.S.C. section 360bbb-3(b)(1), unless the authorization is terminated or revoked.  Performed at [facility], [address], [place], [place] [address]      Labs: BNP (last 3 results) Recent Labs    07/10/20 1351  BNP 947.6*   Basic Metabolic Panel: Recent Labs  Lab 02/21/21 1917 02/22/21 2215 02/22/21 2242 02/23/21 0524 02/24/21 0604  NA 127* 128* 128* 133* 135  K 3.6 3.6 4.1 3.6 3.4*  CL 92* 88* 91* 97* 100  CO2 27 21*  --  25 26  GLUCOSE 455* 753* >700* 225* 109*  BUN 40* 51* 46* 45* 43*  CREATININE 2.85* 3.23* 3.10* 2.65* 2.45*  CALCIUM 8.7* 9.0  --  8.7* 8.8*  MG  --   --   --  1.9  --   PHOS  --   --   --  2.5  --    Liver Function Tests: Recent Labs  Lab 02/21/21 1917 02/22/21 2215 02/23/21 0524  AST _0 ALT _1 ALKPHOS 105 118 94  BILITOT 0.7 0.8 0.4  PROT 6.9 7.6 6.2*  ALBUMIN 3.5 3.8 3.2*   No results for input(s): LIPASE, AMYLASE in the last 168 hours. No results for input(s): AMMONIA in the last 168 hours. CBC: Recent Labs  Lab 02/21/21 1917 02/22/21 2215 02/22/21 2242 02/23/21 0524  WBC 4.4 9.9  --  8.1  NEUTROABS  --  6.2  --   --   HGB 12.2 13.1 13.9 11.8*  HCT 36.2 40.4 41.0 35.6*  MCV 86.6 89.2  --  87.9  PLT 177 208  --  165   Cardiac Enzymes: No results for input(s): CKTOTAL, CKMB, CKMBINDEX, TROPONINI in the last 168 hours. BNP: Invalid input(s): POCBNP CBG: Recent Labs  Lab 02/23/21 1724 02/23/21 2142 02/24/21 0316 02/24/21 0722 02/24/21 1116  GLUCAP 128* 109* 84 103* 294*   D-Dimer No results for input(s):  DDIMER in the last 72 hours. Hgb A1c No results for input(s): HGBA1C in the last 72 hours. Lipid Profile No results for input(s): CHOL, HDL, LDLCALC, TRIG, CHOLHDL, LDLDIRECT in the last 72 hours. Thyroid function studies No results for input(s): TSH, T4TOTAL, T3FREE, THYROIDAB in the last 72 hours.  Invalid input(s): FREET3 Anemia work up No results for input(s): VITAMINB12, FOLATE, FERRITIN, TIBC, IRON, RETICCTPCT in the last 72 hours. Urinalysis    Component Value Date/Time   COLORURINE YELLOW 02/22/2021 2253   APPEARANCEUR HAZY (A) 02/22/2021 2253   LABSPEC 1.020 02/22/2021 2253   PHURINE 5.0 02/22/2021 2253   GLUCOSEU >=500 (A) 02/22/2021 2253   HGBUR SMALL (A) 02/22/2021 2253   BILIRUBINUR NEGATIVE 02/22/2021 2253   KETONESUR NEGATIVE 02/22/2021 2253   PROTEINUR >=300 (A) 02/22/2021 2253   UROBILINOGEN 0.2 07/13/2012 1236   NITRITE NEGATIVE 02/22/2021 2253   LEUKOCYTESUR NEGATIVE 02/22/2021 2253   Sepsis Labs Invalid input(s): PROCALCITONIN,  WBC,  LACTICIDVEN Microbiology Recent Results (from the past 240 hour(s))  Urine Culture     Status: Abnormal   Collection Time: 02/21/21  8:12 PM   Specimen: Urine, Clean Catch  Result Value Ref Range Status   Specimen Description   Final    URINE, CLEAN CATCH Performed at [facility], [address], [place], [place]  [address]    Special Requests   Final    NONE Performed at [facility], [address] [place], [place] [address]    Culture (A)  Final    10,000 COLONIES/mL MULTIPLE SPECIES PRESENT, SUGGEST RECOLLECTION   Report Status 02/23/2021 FINAL  Final  Resp Panel by RT-PCR (Flu A&B, Covid) Nasopharyngeal Swab     Status: None   Collection Time: 02/22/21 10:56 PM   Specimen: Nasopharyngeal Swab; Nasopharyngeal(NP) swabs in vial transport medium  Result Value Ref Range Status   SARS Coronavirus 2 by RT PCR NEGATIVE NEGATIVE Final    Comment: (NOTE) SARS-CoV-2 target nucleic acids are NOT DETECTED.  The  SARS-CoV-2 RNA is generally detectable in upper respiratory specimens during the acute phase of infection. The lowest concentration of SARS-CoV-2 viral copies this assay can detect is 138 copies/mL. A negative result does not preclude SARS-Cov-2 infection and should not be used as the sole basis for treatment or other patient management decisions. A negative result may occur with  improper specimen collection/handling, submission of specimen other than nasopharyngeal swab, presence of viral mutation(s) within the areas targeted by this assay, and inadequate number of viral copies(<138 copies/mL). A negative result must be combined with clinical observations, patient history, and epidemiological information. The expected result is Negative.  Fact Sheet for Patients:  EntrepreneurPulse.com.au  Fact Sheet for Healthcare Providers:  IncredibleEmployment.be  This test is no t yet approved or cleared by the [place] FDA and  has been authorized for detection and/or diagnosis of SARS-CoV-2 by FDA under an Emergency Use Authorization (EUA). This EUA will remain  in effect (meaning this test can be used) for the duration of the COVID-19 declaration under Section 564(b)(1) of the Act, 21 U.S.C.section 360bbb-3(b)(1), unless the authorization is terminated  or revoked sooner.       Influenza A by PCR NEGATIVE NEGATIVE Final   Influenza B by PCR NEGATIVE NEGATIVE Final    Comment: (NOTE) The Xpert Xpress SARS-CoV-2/FLU/RSV plus assay is intended as an aid in the diagnosis of influenza from Nasopharyngeal swab specimens and should not be used as a sole basis for treatment. Nasal washings and aspirates are unacceptable for Xpert Xpress SARS-CoV-2/FLU/RSV testing.  Fact Sheet for Patients: EntrepreneurPulse.com.au  Fact Sheet for Healthcare Providers: IncredibleEmployment.be  This test is not yet approved or  cleared by the [place] FDA and has been authorized for detection and/or diagnosis of SARS-CoV-2 by FDA under an Emergency Use Authorization (EUA). This EUA will remain in effect (meaning this test can be used) for the duration of the COVID-19 declaration under Section 564(b)(1) of the Act, 21 U.S.C. section 360bbb-3(b)(1), unless the authorization is terminated or revoked.  Performed at [facility], [address], [place],  [address]    Time coordinating discharge: 39 minutes   SIGNED:  Irwin Brakeman, MD  [facility] 02/24/2021, 1:13 PM How to contact the [facility] Attending or Consulting provider [place] or covering provider during after hours [place], for this patient?  Check the care team in [facility] and look for a) attending/consulting [facility] provider listed and b) the [facility] team listed Log into [email or web address] and use [place]'s universal password to access. If you do not have the password, please contact the hospital operator. Locate the [facility] provider you are looking for under [facility] and page to a number that you can be directly reached. If you still have difficulty reaching the provider, please page the [facility] (Director on Call) for the Hospitalists  listed on amion for assistance.

## 2021-02-24 NOTE — TOC Transition Note (Signed)
Transition of Care [facility]) - CM/SW Discharge Note   Patient Details  Name: Chelsea Meza MRN: [id number] Date of Birth: 22-Jan-1936  Transition of Care [facility]) CM/SW Contact:  Shade Flood, LCSW Phone Number: 02/24/2021, 12:40 PM   Clinical Narrative:     Pt stable for dc per MD. PT recommended HH for dc. Spoke with pt and her daughter to review dc planning. Pt's daughter states that they are staying with a friend at dc and they don't think they should have [place] come out since it is not their home. Offered referral for outpatient PT and daughter is agreeable to this option.  Referral sent to outpatient PT and they will follow up to arrange visits.  There are no other TOC needs for dc.  Expected Discharge Plan: Home/Self Care Barriers to Discharge: Barriers Resolved   Patient Goals and CMS Choice Patient states their goals for this hospitalization and ongoing recovery are:: go home CMS Medicare.gov Compare Post Acute Care list provided to:: Patient Choice offered to / list presented to : Patient  Expected Discharge Plan and Services Expected Discharge Plan: Home/Self Care In-house Referral: Clinical Social Work   Post Acute Care Choice:  (outpatient PT) Living arrangements for the past 2 months: Single Family Home                                      Prior Living Arrangements/Services Living arrangements for the past 2 months: Single Family Home Lives with:: Adult Children Patient language and need for interpreter reviewed:: Yes Do you feel safe going back to the place where you live?: Yes      Need for Family Participation in Patient Care: Yes (Comment) Care giver support system in place?: Yes (comment) Current home services: DME Criminal Activity/Legal Involvement Pertinent to Current Situation/Hospitalization: No - Comment as needed  Activities of Daily Living Home Assistive Devices/Equipment: Cane (specify quad or straight), CBG Meter ADL Screening  (condition at time of admission) Patient's cognitive ability adequate to safely complete daily activities?: Yes Is the patient deaf or have difficulty hearing?: No Does the patient have difficulty seeing, even when wearing glasses/contacts?: No Does the patient have difficulty concentrating, remembering, or making decisions?: No Patient able to express need for assistance with ADLs?: Yes Does the patient have difficulty dressing or bathing?: Yes Independently performs ADLs?: No Communication: Independent Dressing (OT): Needs assistance Is this a change from baseline?: Pre-admission baseline Grooming: Needs assistance Is this a change from baseline?: Pre-admission baseline Feeding: Needs assistance Is this a change from baseline?: Pre-admission baseline Bathing: Needs assistance Is this a change from baseline?: Pre-admission baseline Toileting: Needs assistance Is this a change from baseline?: Pre-admission baseline In/Out Bed: Needs assistance Is this a change from baseline?: Pre-admission baseline Walks in Home: Needs assistance Is this a change from baseline?: Pre-admission baseline Does the patient have difficulty walking or climbing stairs?: Yes Weakness of Legs: None Weakness of Arms/Hands: None  Permission Sought/Granted Permission sought to share information with : Chartered certified accountant granted to share information with : Yes, Verbal Permission Granted     Permission granted to share info w AGENCY: outpatient PT        Emotional Assessment       Orientation: : Oriented to Self, Oriented to Place, Oriented to  Time, Oriented to Situation Alcohol / Substance Use: Not Applicable Psych Involvement: No (comment)  Admission diagnosis:  Seizures ([place]) [  R56.9] DKA (diabetic ketoacidosis) ([place]) [E11.10] Patient Active Problem List   Diagnosis Date Noted   Pressure injury of skin 02/24/2021   Seizures ([place]) 41/96/2229   Acute metabolic encephalopathy  79/89/2119   Acute lower UTI 02/23/2021   Elevated troponin 02/23/2021   DKA (diabetic ketoacidosis) ([place]) 02/23/2021   Malnutrition of moderate degree 06/23/2020   Poorly controlled diabetes mellitus ([place]) 06/21/2020   Hypoglycemia associated with diabetes ([place]) 06/21/2020   Hyperosmolar hyperglycemic state (HHS) ([place]) 04/05/2020   Hypertensive urgency 04/05/2020   Cardiac arrest ([place]) 04/05/2020   Acute congestive heart failure ([place]) 10/18/2018   Hyperosmolar non-ketotic state in patient with type 2 diabetes mellitus ([place]) 10/18/2018   Transient cerebral ischemia 11/05/2017   Hypokalemia 11/05/2017   Hyponatremia 03/20/2015   AKI (acute kidney injury) ([place]) 03/20/2015   CKD (chronic kidney disease) stage 3, GFR 30-59 ml/min ([name]) 03/20/2015   Hyperglycemia due to diabetes mellitus ([place]) 03/20/2015   Hyperlipidemia 03/20/2015   Diabetic hyperosmolar non-ketotic state ([place]) 07/14/2012   Lactic acidosis 07/14/2012   DKA, type 2 ([place]) 07/13/2012   Acute kidney injury superimposed on CKD ([place]) 07/13/2012   Paroxysmal supraventricular tachycardia ([place]) 07/13/2012   Paraparesis ([place]) 07/13/2012   Paresthesia of left leg 07/13/2012   Cough 07/13/2012   Essential hypertension 07/13/2012   Type 2 diabetes mellitus with ketoacidosis without coma ([place]) 07/13/2012   Paresthesia of skin 07/13/2012   PCP:  Iona Beard, MD Pharmacy:   [place], [address] [place] [address] Phone: [phone] Fax: [phone]  [facility] [address] [place] [address] Phone: [phone] Fax: [phone]     Social Determinants of Health (SDOH) Interventions    Readmission Risk Interventions Readmission Risk Prevention Plan 02/24/2021  Transportation Screening Complete  HRI or Home Care Consult Patient refused  Social Work Consult for [place] Planning/Counseling Complete  Palliative Care Screening  Not Applicable  Medication Review Press photographer) Complete  Some recent data might be hidden     Final next level of care: Home/Self Care Barriers to Discharge: Barriers Resolved   Patient Goals and CMS Choice Patient states their goals for this hospitalization and ongoing recovery are:: go home CMS Medicare.gov Compare Post Acute Care list provided to:: Patient Choice offered to / list presented to : Patient  Discharge Placement                       Discharge Plan and Services In-house Referral: Clinical Social Work   Post Acute Care Choice:  (outpatient PT)                               Social Determinants of Health (SDOH) Interventions     Readmission Risk Interventions Readmission Risk Prevention Plan 02/24/2021  Transportation Screening Complete  HRI or Home Care Consult Patient refused  Social Work Consult for [place] Planning/Counseling Complete  Palliative Care Screening Not Applicable  Medication Review Press photographer) Complete  Some recent data might be hidden

## 2021-03-02 ENCOUNTER — Other Ambulatory Visit: Payer: Self-pay | Admitting: *Deleted

## 2021-03-02 NOTE — Patient Outreach (Signed)
[facility]) Care Management  03/02/2021  Chelsea Meza 1935/10/17 [id number]  Initial telephone outreach for post acute care referral. Called and left a message on [phone] to return call other number had full mail box or was disconnected. Will call back again tomorrow.  [name]. Myrtie Neither, MSN, [facility] Gerontological Nurse Practitioner [facility] Care Management [phone]

## 2021-03-03 ENCOUNTER — Other Ambulatory Visit: Payer: Self-pay | Admitting: *Deleted

## 2021-03-03 NOTE — Patient Outreach (Signed)
[facility]) Care Management  03/03/2021  JAZMENE RACZ 1936/01/23 [id number]  Second outreach for [place] Management servcies. Talked with pt's daughter, Maizy Davanzo, who states they don't have a home at present and are hoping from one family members home to another. This is putting on a lot of stress to her mother and she. Ms. Charette does work. She is off today and she is going to call the [employer] today.  Advised will put in a social work referral to see if our staff member may have some ideas for them.  Will send [facility] information.  [name]. Myrtie Neither, MSN, [facility] Gerontological Nurse Practitioner [facility] Care Management [phone]

## 2021-03-07 ENCOUNTER — Other Ambulatory Visit: Payer: Self-pay

## 2021-03-07 DIAGNOSIS — I10 Essential (primary) hypertension: Secondary | ICD-10-CM

## 2021-03-21 ENCOUNTER — Other Ambulatory Visit: Payer: Self-pay | Admitting: *Deleted

## 2021-03-21 NOTE — Patient Outreach (Signed)
[facility]) Care Management  03/21/2021  Chelsea Meza 1935-07-02 [id number]  Unsuccessful outreach. Called all listed numbers for pt and for daughter. Was able to leave a message on [phone]. And requested a return call. Will call again after Thanksgiving holiday.  [name]. Myrtie Neither, MSN, [facility] Gerontological Nurse Practitioner [facility] Care Management [phone]

## 2021-03-28 ENCOUNTER — Telehealth: Payer: Self-pay | Admitting: *Deleted

## 2021-03-28 NOTE — Telephone Encounter (Signed)
   Telephone encounter was:  Unsuccessful.  03/28/2021 Name: Chelsea Meza MRN: [id number] DOB: 02-Jan-1936  Unsuccessful outbound call made today to assist with:   housing  Outreach Attempt:  1st Attempt  No answer or voicemail able to be left at 3 different numbers  [place], Care Management  [phone] [address] , [place] [address] Email : [email or web address]

## 2021-03-30 ENCOUNTER — Other Ambulatory Visit: Payer: Self-pay | Admitting: *Deleted

## 2021-03-31 ENCOUNTER — Other Ambulatory Visit: Payer: Self-pay | Admitting: *Deleted

## 2021-03-31 NOTE — Patient Outreach (Signed)
  Care Management   Follow Up Note   03/31/2021 Name: Chelsea Meza MRN: [id number] DOB: 06-26-1935   Referred by: Iona Beard, MD Reason for referral : Care Coordination   An unsuccessful telephone outreach was attempted today. The patient was referred to the case management team for assistance with care management and care coordination. LCSW made attempts to try and contact patient, as well as her two daughter's, without success.  HIPAA compliant messages were left on voicemail, including contact information, encouraging a return call to LCSW at their earliest convenience.  LCSW will make a second initial telephone outreach call within the next 5-7 business days, if a return call is not received in the meantime.  Follow-Up Plan:  04/08/2021 at 11:00 am  Nat Christen, BSW, MSW, [place]  Licensed Clinical Social Worker  [place]  Mailing [place] [address] [place], [place] [address] Physical Address-[address], [place], [place] [address] Toll Free Main # [phone] Fax # [phone] Cell # [phone]  [email or web address]

## 2021-04-01 ENCOUNTER — Other Ambulatory Visit: Payer: Self-pay | Admitting: *Deleted

## 2021-04-01 ENCOUNTER — Telehealth: Payer: Self-pay | Admitting: *Deleted

## 2021-04-01 NOTE — Telephone Encounter (Signed)
   Telephone encounter was:  Unsuccessful.  04/01/2021 Name: Chelsea Meza MRN: [id number] DOB: Dec 07, 1935  Unsuccessful outbound call made today to assist with:  Food Insecurity  Outreach Attempt:  2nd Attempt The daughters mailbox is full and you cannot leave a message  [place], Care Management  [phone] [address] , [place] [address] Email : [email or web address]

## 2021-04-01 NOTE — Patient Outreach (Signed)
[facility]) Care Management  04/01/2021  CYBIL SENEGAL [birth year] [id number]  Last telephone outreach to reconnect to pt's daughter, Velmer Broadfoot, to follow up on her request for assistance with housing. There is not answer and the voicemail box is full. Many on the [facility] team have tried to outreach without success. Today NP is closing the case and send an unsuccessful letter. NP has advised they are welcome to call, text or email [place] but also that we need to be able to reach them in return.  [name]. Myrtie Neither, MSN, [facility] Gerontological Nurse Practitioner [facility] Care Management [phone]

## 2021-04-06 ENCOUNTER — Telehealth: Payer: Self-pay | Admitting: *Deleted

## 2021-04-06 NOTE — Telephone Encounter (Signed)
   Telephone encounter was:  Unsuccessful.  04/06/2021 Name: Chelsea Meza MRN: [id number] DOB: 06-06-1935  Unsuccessful outbound call made today to assist with:  Tried again all the numbers provided and there was no voicemails that were working or not full so you could not leave a voicemail for daughter etc. Will close due to 3x called no response, but happy to assist if patient family makes them selves available   Outreach Attempt:    Tried again all the numbers provided and there was no voicemails that were working or not full so you could not leave a voicemail for daughter etc. Will close due to 3x called no response, but happy to assist if patient family makes them selves available   [place], Care Management  [phone] [address] , [place] [address] Email : [email or web address]

## 2021-04-08 ENCOUNTER — Ambulatory Visit: Payer: Medicare Other | Admitting: *Deleted

## 2021-04-16 ENCOUNTER — Emergency Department (HOSPITAL_COMMUNITY)
Admission: EM | Admit: 2021-04-16 | Discharge: 2021-04-16 | Disposition: A | Discharge status: Home / Self Care | Payer: Medicare Other | Attending: Emergency Medicine | Admitting: Emergency Medicine

## 2021-04-16 ENCOUNTER — Other Ambulatory Visit: Payer: Self-pay

## 2021-04-16 ENCOUNTER — Emergency Department (HOSPITAL_COMMUNITY): Payer: Medicare Other

## 2021-04-16 ENCOUNTER — Encounter (HOSPITAL_COMMUNITY): Payer: Self-pay

## 2021-04-16 DIAGNOSIS — Z7982 Long term (current) use of aspirin: Secondary | ICD-10-CM | POA: Insufficient documentation

## 2021-04-16 DIAGNOSIS — I129 Hypertensive chronic kidney disease with stage 1 through stage 4 chronic kidney disease, or unspecified chronic kidney disease: Secondary | ICD-10-CM | POA: Insufficient documentation

## 2021-04-16 DIAGNOSIS — N183 Chronic kidney disease, stage 3 unspecified: Secondary | ICD-10-CM | POA: Diagnosis not present

## 2021-04-16 DIAGNOSIS — M545 Low back pain, unspecified: Secondary | ICD-10-CM | POA: Insufficient documentation

## 2021-04-16 DIAGNOSIS — M5135 Other intervertebral disc degeneration, thoracolumbar region: Secondary | ICD-10-CM | POA: Insufficient documentation

## 2021-04-16 DIAGNOSIS — E1122 Type 2 diabetes mellitus with diabetic chronic kidney disease: Secondary | ICD-10-CM | POA: Insufficient documentation

## 2021-04-16 DIAGNOSIS — M40204 Unspecified kyphosis, thoracic region: Secondary | ICD-10-CM | POA: Diagnosis not present

## 2021-04-16 DIAGNOSIS — M546 Pain in thoracic spine: Secondary | ICD-10-CM | POA: Diagnosis not present

## 2021-04-16 DIAGNOSIS — Z794 Long term (current) use of insulin: Secondary | ICD-10-CM | POA: Insufficient documentation

## 2021-04-16 DIAGNOSIS — M5136 Other intervertebral disc degeneration, lumbar region: Secondary | ICD-10-CM | POA: Diagnosis not present

## 2021-04-16 LAB — URINALYSIS, ROUTINE W REFLEX MICROSCOPIC
Bilirubin Urine: NEGATIVE
Glucose, UA: 50 mg/dL — AB
Hgb urine dipstick: NEGATIVE
Ketones, ur: NEGATIVE mg/dL
Nitrite: NEGATIVE
Protein, ur: >=300 mg/dL — AB
Specific Gravity, Urine: 1.012 (ref 1.005–1.030)
pH: 6 (ref 5.0–8.0)

## 2021-04-16 LAB — CBG MONITORING, ED: Glucose-Capillary: 166 mg/dL — ABNORMAL HIGH (ref 70–99)

## 2021-04-16 MED ORDER — FLUCONAZOLE 150 MG PO TABS
150.0000 mg | ORAL_TABLET | Freq: Once | ORAL | Status: AC
  Administered 2021-04-16: 150 mg via ORAL
  Filled 2021-04-16: qty 1

## 2021-04-16 MED ORDER — METOPROLOL TARTRATE 25 MG PO TABS
25.0000 mg | ORAL_TABLET | Freq: Once | ORAL | Status: AC
  Administered 2021-04-16: 25 mg via ORAL
  Filled 2021-04-16: qty 1

## 2021-04-16 MED ORDER — LIDOCAINE 5 % EX PTCH
1.0000 | MEDICATED_PATCH | CUTANEOUS | Status: DC
  Administered 2021-04-16: 1 via TRANSDERMAL
  Filled 2021-04-16: qty 1

## 2021-04-16 MED ORDER — HYDRALAZINE HCL 25 MG PO TABS
100.0000 mg | ORAL_TABLET | Freq: Once | ORAL | Status: AC
  Administered 2021-04-16: 100 mg via ORAL
  Filled 2021-04-16: qty 4

## 2021-04-16 MED ORDER — LIDOCAINE 5 % EX PTCH: 1.0000 | MEDICATED_PATCH | CUTANEOUS | 0 refills | Status: DC

## 2021-04-16 NOTE — ED Triage Notes (Addendum)
Pt reports Lower back pain that started 1 month ago after a fall.  Pt is alert and oriented.  Able to transfer from wc to bed.  Resp even and unlabored.  Skin warm and dry.  Nad.  Pt states that she did not take any of her medications this morning.

## 2021-04-16 NOTE — ED Provider Notes (Signed)
[facility] Provider Note   CSN: [id number] Arrival date & time: 04/16/21  7829     History Chief Complaint  Patient presents with   Back Pain    Chelsea Meza is a 85 y.o. female with a history including type 2 diabetes, chronic kidney disease stage III, hypertension, hyperlipidemia and history of TIA presenting with a 1 month history of midline back pain after she fell.  She states that she was in the bathroom when she fell and actually landed inside the tub.  She states that she was seen here the day of this injury, but review of chart does not reveal a visit for a fall recently.  That being said, she denies any other injuries but reports persistent midline pain which is worsened with movement but is also present at rest.  She denies urinary or fecal incontinence or retention and denies weakness or numbness in her legs.  She has been taking an OTC "ache in the back pain" reliever which does provide moderate benefit.  The history is provided by the patient.      Past Medical History:  Diagnosis Date   Anemia    CKD (chronic kidney disease) stage 3, GFR 30-59 ml/min ([name])    Diabetes mellitus without complication ([place])    Hyperlipidemia    Hypertension    TIA (transient ischemic attack)     Patient Active Problem List   Diagnosis Date Noted   Pressure injury of skin 02/24/2021   Seizures ([place]) 56/21/3086   Acute metabolic encephalopathy 57/84/6962   Acute lower UTI 02/23/2021   Elevated troponin 02/23/2021   DKA (diabetic ketoacidosis) ([place]) 02/23/2021   Malnutrition of moderate degree 06/23/2020   Poorly controlled diabetes mellitus ([place]) 06/21/2020   Hypoglycemia associated with diabetes ([place]) 06/21/2020   Hyperosmolar hyperglycemic state (HHS) ([place]) 04/05/2020   Hypertensive urgency 04/05/2020   Cardiac arrest ([place]) 04/05/2020   Acute congestive heart failure ([place]) 10/18/2018   Hyperosmolar non-ketotic state in patient with type 2 diabetes  mellitus ([place]) 10/18/2018   Transient cerebral ischemia 11/05/2017   Hypokalemia 11/05/2017   Hyponatremia 03/20/2015   AKI (acute kidney injury) ([place]) 03/20/2015   CKD (chronic kidney disease) stage 3, GFR 30-59 ml/min ([name]) 03/20/2015   Hyperglycemia due to diabetes mellitus ([place]) 03/20/2015   Hyperlipidemia 03/20/2015   Diabetic hyperosmolar non-ketotic state ([place]) 07/14/2012   Lactic acidosis 07/14/2012   DKA, type 2 ([place]) 07/13/2012   Acute kidney injury superimposed on CKD ([place]) 07/13/2012   Paroxysmal supraventricular tachycardia ([place]) 07/13/2012   Paraparesis ([place]) 07/13/2012   Paresthesia of left leg 07/13/2012   Cough 07/13/2012   Essential hypertension 07/13/2012   Type 2 diabetes mellitus with ketoacidosis without coma ([place]) 07/13/2012   Paresthesia of skin 07/13/2012    Past Surgical History:  Procedure Laterality Date   CHOLECYSTECTOMY       OB History   No obstetric history on file.     Family History  Problem Relation Age of Onset   Stroke Mother    Stroke Maternal Grandmother     Social History   Tobacco Use   Smoking status: Never   Smokeless tobacco: Never  Vaping Use   Vaping Use: Never used  Substance Use Topics   Alcohol use: No   Drug use: No    Home Medications Prior to Admission medications   Medication Sig Start Date End Date Taking? Authorizing Provider  lidocaine (LIDODERM) 5 % Place 1 patch onto the skin daily. Remove &  Discard patch within 12 hours or as directed by MD 04/16/21  Yes Enrico Eaddy, Almyra Free, PA-C  Accu-Chek FastClix Lancets MISC  04/09/20   [provider]  ACCU-CHEK GUIDE test strip USE TO TEST TWICE DAILY.AL 07/05/20   [provider]  aspirin EC 81 MG tablet Take 81 mg by mouth daily.    [provider]  atorvastatin (LIPITOR) 80 MG tablet Take 80 mg by mouth at bedtime. 12/18/20   [provider]  Blood Glucose Monitoring Suppl (ACCU-CHEK GUIDE) w/Device KIT USE AS DIRECTED.I 09/03/20    [provider]  Cyanocobalamin (VITAMIN B-12 CR PO) Take 1 tablet by mouth daily.    [provider]  furosemide (LASIX) 40 MG tablet Take 1 tablet (40 mg total) by mouth daily. 02/28/21   Johnson, Clanford L, MD  GVOKE PFS 0.5 MG/0.1ML SOSY Inject 0.5 mg into the skin as needed (low bs). 02/08/21   [provider]  hydrALAZINE (APRESOLINE) 100 MG tablet Take 1 tablet (100 mg total) by mouth 3 (three) times daily. Patient taking differently: Take 100 mg by mouth 2 (two) times daily. 10/22/18   Orson Eva, MD  insulin aspart (NOVOLOG) 100 UNIT/ML FlexPen Inject 6 Units into the skin 3 (three) times daily with meals. If eats 50% or more of meal. 02/24/21   Johnson, Clanford L, MD  insulin glargine (LANTUS) 100 UNIT/ML Solostar Pen INJECT 25 UNITS INTO THE SKIN DAILY. Patient taking differently: Inject 25 Units into the skin daily. 06/23/20 06/23/21  Jonetta Osgood, MD  Insulin Pen Needle (PEN NEEDLES) 32G X 4 MM MISC Use as directed 06/23/20   Jonetta Osgood, MD  Insulin Pen Needle 32G X 4 MM MISC USE AS DIRECTED 06/23/20 06/23/21  Ghimire, Henreitta Leber, MD  metoprolol tartrate (LOPRESSOR) 25 MG tablet Take 1 tablet (25 mg total) by mouth 2 (two) times daily. 02/24/21   Johnson, Clanford L, MD  Vitamin D, Ergocalciferol, (DRISDOL) 1.25 MG (50000 UNIT) CAPS capsule Take 50,000 Units by mouth once a week. 01/08/20   [provider]    Allergies    Patient has no known allergies.  Review of Systems   Review of Systems  Constitutional:  Negative for chills and fever.  HENT:  Negative for congestion and sore throat.   Eyes: Negative.   Respiratory:  Negative for chest tightness and shortness of breath.   Cardiovascular:  Negative for chest pain.  Gastrointestinal:  Negative for abdominal pain, nausea and vomiting.  Genitourinary:  Positive for frequency. Negative for dysuria, enuresis and urgency.  Musculoskeletal:  Positive for back pain. Negative for  arthralgias, joint swelling and neck pain.  Skin: Negative.  Negative for rash and wound.  Neurological:  Negative for dizziness, weakness, light-headedness, numbness and headaches.  Psychiatric/Behavioral: Negative.    All other systems reviewed and are negative.  Physical Exam Updated Vital Signs BP (!) 152/120    Pulse 89    Temp 98.7 F (37.1 C)    Resp 20    Ht '5\' 10"'  (1.778 m)    Wt 59 kg    SpO2 100%    BMI 18.65 kg/m   Physical Exam Vitals and nursing note reviewed.  Constitutional:      Appearance: She is well-developed.  HENT:     Head: Normocephalic.  Eyes:     Conjunctiva/sclera: Conjunctivae normal.  Cardiovascular:     Rate and Rhythm: Normal rate.     Pulses: Normal pulses.     Comments: Pedal  pulses normal. Pulmonary:     Effort: Pulmonary effort is normal.  Abdominal:     General: Bowel sounds are normal. There is no distension.     Palpations: Abdomen is soft. There is no mass.  Musculoskeletal:        General: Normal range of motion.     Cervical back: Normal range of motion and neck supple.     Lumbar back: Bony tenderness present. No swelling, edema or spasms. Negative right straight leg raise test and negative left straight leg raise test.     Comments: There is bony tenderness midline lower thoracic and upper lumbar regions.  There is no palpable deformities or edema noted.  Skin:    General: Skin is warm and dry.  Neurological:     Mental Status: She is alert and oriented to person, place, and time.     Sensory: No sensory deficit.     Motor: No tremor or atrophy.     Gait: Gait normal.     Deep Tendon Reflexes:     Reflex Scores:      Patellar reflexes are 2+ on the right side and 2+ on the left side.    Comments: No strength deficit noted in hip and knee flexor and extensor muscle groups.  Ankle flexion and extension intact.    ED Results / Procedures / Treatments   Labs (all labs ordered are listed, but only abnormal results are  displayed) Labs Reviewed  URINALYSIS, ROUTINE W REFLEX MICROSCOPIC - Abnormal; Notable for the following components:      Result Value   APPearance HAZY (*)    Glucose, UA 50 (*)    Protein, ur >=300 (*)    Leukocytes,Ua MODERATE (*)    Bacteria, UA RARE (*)    All other components within normal limits  CBG MONITORING, ED - Abnormal; Notable for the following components:   Glucose-Capillary 166 (*)    All other components within normal limits    EKG None  Radiology DG Thoracic Spine 2 View  Result Date: 04/16/2021 CLINICAL DATA:  Fall 1 month ago with persistent back pain. EXAM: THORACIC SPINE 2 VIEWS COMPARISON:  Lumbar spine radiographs-earlier same day; CT abdomen pelvis-02/21/2021 Chest radiograph-03/20/2015 FINDINGS: Evaluation of the superior aspect of the thoracic spine as well as the cervicothoracic junction is degraded secondary to overlying osseous and soft tissue structures. Accentuated thoracic kyphosis. No definite anterolisthesis or retrolisthesis. No significant scoliotic curvature of the thoracic spine. Thoracic vertebral body heights appear preserved. Stigmata of dish within the midthoracic spine. Thoracic intervertebral disc space heights appear preserved. Limited visualization adjacent thorax demonstrates enlargement of the cardiac silhouette. There is rightward deviation of the tracheal air column at the level of the thoracic aorta, similar to remote chest radiograph performed 03/2015. IMPRESSION: No definite acute findings. Electronically Signed   By: Sandi Mariscal M.D.   On: 04/16/2021 10:42   DG Lumbar Spine Complete  Result Date: 04/16/2021 CLINICAL DATA:  Post fall 1 month ago with persistent low back pain. EXAM: LUMBAR SPINE - COMPLETE 4+ VIEW COMPARISON:  CT abdomen pelvis-02/21/2021 FINDINGS: There are 5 non rib-bearing lumbar type vertebral bodies. Mild scoliotic curvature of the thoracolumbar spine with dominant caudal component convex the left measuring  approximately 10 degrees (as measured from the superior endplate of L1 to the inferior endplate of L4). No definite anterolisthesis or retrolisthesis given obliquity. Lumbar vertebral body heights appear preserved given obliquity. Mild to moderate multilevel lumbar spine DDD, worse at T12-L1 and  L5-S1 with disc space height loss, endplate irregularity and sclerosis. Limited visualization of the bilateral SI joints is normal. Dystrophic calcification at the level of the pancreatic head as better demonstrated on recent noncontrast abdominal CT. Scattered vascular calcifications. Phleboliths overlie the lower pelvis bilaterally. IMPRESSION: 1. No acute findings. 2. Mild-to-moderate multilevel lumbar spine DDD, worse at T12-L1 and L5-S1. 3. Mild scoliotic curvature of the thoracolumbar spine, presumably degenerative in etiology. Electronically Signed   By: Sandi Mariscal M.D.   On: 04/16/2021 10:31    Procedures Procedures   Medications Ordered in ED Medications  lidocaine (LIDODERM) 5 % 1 patch (has no administration in time range)  fluconazole (DIFLUCAN) tablet 150 mg (has no administration in time range)  hydrALAZINE (APRESOLINE) tablet 100 mg (100 mg Oral Given 04/16/21 0936)  metoprolol tartrate (LOPRESSOR) tablet 25 mg (25 mg Oral Given 04/16/21 4782)    ED Course  I have reviewed the triage vital signs and the nursing notes.  Pertinent labs & imaging results that were available during my care of the patient were reviewed by me and considered in my medical decision making (see chart for details).    MDM Rules/Calculators/A&P                         Labs and imaging reviewed and discussed with patient.  She does have some degenerative changes but no acute injury from her fall.  Her urine is unremarkable except there are some evidence for a yeast infection, I suspect that this is vaginal source and not a urinary yeast infection.  She was given a dose of Diflucan here.  Her abdomen is soft, no  pulsatile mass, recent noncontrast CT from October was reviewed with no obvious AAA.  Her exam is consistent with musculoskeletal source.  She was given a Lidoderm patch with a prescription for the same if this is helpful.  She is also given referral to [place] [facility] for further evaluation of her chronic findings if her pain does not improve with today's treatment plan.    Final Clinical Impression(s) / ED Diagnoses Final diagnoses:  Acute midline low back pain without sciatica  DDD (degenerative disc disease), thoracolumbar    Rx / DC Orders ED Discharge Orders          Ordered    lidocaine (LIDODERM) 5 %  Every 24 hours        04/16/21 1135             Evalee Jefferson, PA-C 04/16/21 1139    Long, Wonda Old[facility], MD 04/17/21 [phone]

## 2021-04-16 NOTE — Discharge Instructions (Addendum)
Your spine x-rays today show that you do have some wear and tear of your thoracic and lumbar spine but no specific injuries from your fall.  You have been prescribed medication in patch form to see if this will help your pain symptoms.  If it is not helpful you may benefit from seeing a spine specialist which you have been referred.  You do not have a urinary tract infection, but there was evidence of yeast obtained during the collection of your urine.  You have been given a Diflucan tablet here, usually a one-time dose of this medication will resolve a yeast vaginitis which is the suspected source of this abnormal lab finding.  Your blood pressure was elevated today, it is very important that you take all of your home prescribed medications.

## 2021-04-25 ENCOUNTER — Other Ambulatory Visit: Payer: Self-pay

## 2021-04-25 ENCOUNTER — Encounter (HOSPITAL_COMMUNITY): Payer: Self-pay

## 2021-04-25 ENCOUNTER — Emergency Department (HOSPITAL_COMMUNITY)
Admission: EM | Admit: 2021-04-25 | Discharge: 2021-04-25 | Disposition: A | Discharge status: Home / Self Care | Payer: Medicare Other | Attending: Emergency Medicine | Admitting: Emergency Medicine

## 2021-04-25 ENCOUNTER — Emergency Department (HOSPITAL_COMMUNITY): Payer: Medicare Other

## 2021-04-25 DIAGNOSIS — M545 Low back pain, unspecified: Secondary | ICD-10-CM | POA: Insufficient documentation

## 2021-04-25 DIAGNOSIS — Z7982 Long term (current) use of aspirin: Secondary | ICD-10-CM | POA: Diagnosis not present

## 2021-04-25 DIAGNOSIS — I130 Hypertensive heart and chronic kidney disease with heart failure and stage 1 through stage 4 chronic kidney disease, or unspecified chronic kidney disease: Secondary | ICD-10-CM | POA: Insufficient documentation

## 2021-04-25 DIAGNOSIS — R6889 Other general symptoms and signs: Secondary | ICD-10-CM | POA: Diagnosis not present

## 2021-04-25 DIAGNOSIS — N183 Chronic kidney disease, stage 3 unspecified: Secondary | ICD-10-CM | POA: Diagnosis not present

## 2021-04-25 DIAGNOSIS — Z794 Long term (current) use of insulin: Secondary | ICD-10-CM | POA: Diagnosis not present

## 2021-04-25 DIAGNOSIS — R0789 Other chest pain: Secondary | ICD-10-CM | POA: Diagnosis not present

## 2021-04-25 DIAGNOSIS — Z7984 Long term (current) use of oral hypoglycemic drugs: Secondary | ICD-10-CM | POA: Insufficient documentation

## 2021-04-25 DIAGNOSIS — Z743 Need for continuous supervision: Secondary | ICD-10-CM | POA: Diagnosis not present

## 2021-04-25 DIAGNOSIS — E1122 Type 2 diabetes mellitus with diabetic chronic kidney disease: Secondary | ICD-10-CM | POA: Diagnosis not present

## 2021-04-25 DIAGNOSIS — Z79899 Other long term (current) drug therapy: Secondary | ICD-10-CM | POA: Insufficient documentation

## 2021-04-25 DIAGNOSIS — R079 Chest pain, unspecified: Secondary | ICD-10-CM | POA: Diagnosis not present

## 2021-04-25 DIAGNOSIS — I509 Heart failure, unspecified: Secondary | ICD-10-CM | POA: Diagnosis not present

## 2021-04-25 DIAGNOSIS — I499 Cardiac arrhythmia, unspecified: Secondary | ICD-10-CM | POA: Diagnosis not present

## 2021-04-25 LAB — CBC WITH DIFFERENTIAL/PLATELET
Abs Immature Granulocytes: 0.02 10*3/uL (ref 0.00–0.07)
Basophils Absolute: 0.1 10*3/uL (ref 0.0–0.1)
Basophils Relative: 1 %
Eosinophils Absolute: 0.1 10*3/uL (ref 0.0–0.5)
Eosinophils Relative: 1 %
HCT: 35.8 % — ABNORMAL LOW (ref 36.0–46.0)
Hemoglobin: 11.2 g/dL — ABNORMAL LOW (ref 12.0–15.0)
Immature Granulocytes: 0 %
Lymphocytes Relative: 36 %
Lymphs Abs: 2.3 10*3/uL (ref 0.7–4.0)
MCH: 28.4 pg (ref 26.0–34.0)
MCHC: 31.3 g/dL (ref 30.0–36.0)
MCV: 90.6 fL (ref 80.0–100.0)
Monocytes Absolute: 0.4 10*3/uL (ref 0.1–1.0)
Monocytes Relative: 6 %
Neutro Abs: 3.5 10*3/uL (ref 1.7–7.7)
Neutrophils Relative %: 56 %
Platelets: 230 10*3/uL (ref 150–400)
RBC: 3.95 MIL/uL (ref 3.87–5.11)
RDW: 14.3 % (ref 11.5–15.5)
WBC: 6.3 10*3/uL (ref 4.0–10.5)
nRBC: 0 % (ref 0.0–0.2)

## 2021-04-25 LAB — BASIC METABOLIC PANEL
Anion gap: 9 (ref 5–15)
BUN: 33 mg/dL — ABNORMAL HIGH (ref 8–23)
CO2: 31 mmol/L (ref 22–32)
Calcium: 9.2 mg/dL (ref 8.9–10.3)
Chloride: 103 mmol/L (ref 98–111)
Creatinine, Ser: 2.4 mg/dL — ABNORMAL HIGH (ref 0.44–1.00)
GFR, Estimated: 20 mL/min — ABNORMAL LOW (ref >60)
Glucose, Bld: 190 mg/dL — ABNORMAL HIGH (ref 70–99)
Potassium: 3.5 mmol/L (ref 3.5–5.1)
Sodium: 143 mmol/L (ref 135–145)

## 2021-04-25 LAB — TROPONIN I (HIGH SENSITIVITY)
Troponin I (High Sensitivity): 16 ng/L (ref <18)
Troponin I (High Sensitivity): 15 ng/L (ref <18)

## 2021-04-25 MED ORDER — ACETAMINOPHEN 325 MG PO TABS
650.0000 mg | ORAL_TABLET | Freq: Once | ORAL | Status: AC
  Administered 2021-04-25: 14:00:00 650 mg via ORAL
  Filled 2021-04-25: qty 2

## 2021-04-25 MED ORDER — HYDRALAZINE HCL 25 MG PO TABS
100.0000 mg | ORAL_TABLET | Freq: Three times a day (TID) | ORAL | Status: DC
  Administered 2021-04-25: 14:00:00 100 mg via ORAL
  Filled 2021-04-25: qty 4

## 2021-04-25 MED ORDER — INSULIN GLARGINE 100 UNIT/ML ~~LOC~~ SOLN
25.0000 [IU] | Freq: Every day | SUBCUTANEOUS | Status: DC
  Filled 2021-04-25 (×2): qty 0.25

## 2021-04-25 MED ORDER — INSULIN GLARGINE-YFGN 100 UNIT/ML ~~LOC~~ SOLN
25.0000 [IU] | Freq: Every day | SUBCUTANEOUS | Status: DC
  Administered 2021-04-25: 17:00:00 25 [IU] via SUBCUTANEOUS
  Filled 2021-04-25 (×2): qty 0.25

## 2021-04-25 MED ORDER — METOPROLOL TARTRATE 25 MG PO TABS
25.0000 mg | ORAL_TABLET | Freq: Two times a day (BID) | ORAL | Status: DC
  Administered 2021-04-25: 14:00:00 25 mg via ORAL
  Filled 2021-04-25: qty 1

## 2021-04-25 MED ORDER — FUROSEMIDE 40 MG PO TABS
40.0000 mg | ORAL_TABLET | Freq: Every day | ORAL | Status: DC
  Administered 2021-04-25: 14:00:00 40 mg via ORAL
  Filled 2021-04-25: qty 1

## 2021-04-25 NOTE — ED Notes (Signed)
Pt provided with meal tray with snacks and drinks for waiting room.

## 2021-04-25 NOTE — ED Triage Notes (Signed)
Pt bib ems for cp that started last night.  Afib on monitor by ems with hx of same.  Resp even and unlabored.  Sen approx 2 months ago for same.

## 2021-04-25 NOTE — ED Provider Notes (Signed)
[facility] Provider Note   CSN: [id number] Arrival date & time: 04/25/21  1053     History Chief Complaint  Patient presents with   Chest Pain    Chelsea Meza is a 85 y.o. female.  HPI She is here for evaluation of chest pain which started during the night last night.  She presents by EMS.  She has history of prior evaluations for chest pain.  She is here for evaluation of low back pain for 3 months.  She is also noticed some chest pain, on and off since yesterday.  She did not take any of her morning medicines including blood pressure medicines and insulin.  She states she has been feeling "tired," recently.  There are no other known active modifying factors.    Past Medical History:  Diagnosis Date   Anemia    CKD (chronic kidney disease) stage 3, GFR 30-59 ml/min ([name])    Diabetes mellitus without complication ([place])    Hyperlipidemia    Hypertension    TIA (transient ischemic attack)     Patient Active Problem List   Diagnosis Date Noted   Pressure injury of skin 02/24/2021   Seizures ([place]) 03/03/1593   Acute metabolic encephalopathy 58/59/2924   Acute lower UTI 02/23/2021   Elevated troponin 02/23/2021   DKA (diabetic ketoacidosis) ([place]) 02/23/2021   Malnutrition of moderate degree 06/23/2020   Poorly controlled diabetes mellitus ([place]) 06/21/2020   Hypoglycemia associated with diabetes ([place]) 06/21/2020   Hyperosmolar hyperglycemic state (HHS) ([place]) 04/05/2020   Hypertensive urgency 04/05/2020   Cardiac arrest ([place]) 04/05/2020   Acute congestive heart failure ([place]) 10/18/2018   Hyperosmolar non-ketotic state in patient with type 2 diabetes mellitus ([place]) 10/18/2018   Transient cerebral ischemia 11/05/2017   Hypokalemia 11/05/2017   Hyponatremia 03/20/2015   AKI (acute kidney injury) ([place]) 03/20/2015   CKD (chronic kidney disease) stage 3, GFR 30-59 ml/min ([name]) 03/20/2015   Hyperglycemia due to diabetes mellitus ([place]) 03/20/2015    Hyperlipidemia 03/20/2015   Diabetic hyperosmolar non-ketotic state ([place]) 07/14/2012   Lactic acidosis 07/14/2012   DKA, type 2 ([place]) 07/13/2012   Acute kidney injury superimposed on CKD ([place]) 07/13/2012   Paroxysmal supraventricular tachycardia ([place]) 07/13/2012   Paraparesis ([place]) 07/13/2012   Paresthesia of left leg 07/13/2012   Cough 07/13/2012   Essential hypertension 07/13/2012   Type 2 diabetes mellitus with ketoacidosis without coma ([place]) 07/13/2012   Paresthesia of skin 07/13/2012    Past Surgical History:  Procedure Laterality Date   CHOLECYSTECTOMY       OB History   No obstetric history on file.     Family History  Problem Relation Age of Onset   Stroke Mother    Stroke Maternal Grandmother     Social History   Tobacco Use   Smoking status: Never   Smokeless tobacco: Never  Vaping Use   Vaping Use: Never used  Substance Use Topics   Alcohol use: No   Drug use: No    Home Medications Prior to Admission medications   Medication Sig Start Date End Date Taking? Authorizing Provider  Accu-Chek FastClix Lancets MISC  04/09/20   [provider]  ACCU-CHEK GUIDE test strip USE TO TEST TWICE DAILY.AL 07/05/20   [provider]  aspirin EC 81 MG tablet Take 81 mg by mouth daily.    [provider]  atorvastatin (LIPITOR) 80 MG tablet Take 80 mg by mouth at bedtime. 12/18/20   [provider]  Blood Glucose Monitoring Suppl (ACCU-CHEK GUIDE) w/Device KIT USE AS DIRECTED.I 09/03/20   [provider]  Cyanocobalamin (VITAMIN B-12 CR PO) Take 1 tablet by mouth daily.    [provider]  furosemide (LASIX) 40 MG tablet Take 1 tablet (40 mg total) by mouth daily. 02/28/21   Johnson, Clanford L, MD  GVOKE PFS 0.5 MG/0.1ML SOSY Inject 0.5 mg into the skin as needed (low bs). 02/08/21   [provider]  hydrALAZINE (APRESOLINE) 100 MG tablet Take 1 tablet (100 mg total) by mouth 3 (three) times daily. Patient  taking differently: Take 100 mg by mouth 2 (two) times daily. 10/22/18   Orson Eva, MD  insulin aspart (NOVOLOG) 100 UNIT/ML FlexPen Inject 6 Units into the skin 3 (three) times daily with meals. If eats 50% or more of meal. 02/24/21   Johnson, Clanford L, MD  insulin glargine (LANTUS) 100 UNIT/ML Solostar Pen INJECT 25 UNITS INTO THE SKIN DAILY. Patient taking differently: Inject 25 Units into the skin daily. 06/23/20 06/23/21  Jonetta Osgood, MD  Insulin Pen Needle (PEN NEEDLES) 32G X 4 MM MISC Use as directed 06/23/20   Jonetta Osgood, MD  Insulin Pen Needle 32G X 4 MM MISC USE AS DIRECTED 06/23/20 06/23/21  Ghimire, Henreitta Leber, MD  lidocaine (LIDODERM) 5 % Place 1 patch onto the skin daily. Remove & Discard patch within 12 hours or as directed by MD 04/16/21   Evalee Jefferson, PA-C  metoprolol tartrate (LOPRESSOR) 25 MG tablet Take 1 tablet (25 mg total) by mouth 2 (two) times daily. 02/24/21   Johnson, Clanford L, MD  Vitamin D, Ergocalciferol, (DRISDOL) 1.25 MG (50000 UNIT) CAPS capsule Take 50,000 Units by mouth once a week. 01/08/20   [provider]    Allergies    Patient has no known allergies.  Review of Systems   Review of Systems  All other systems reviewed and are negative.  Physical Exam Updated Vital Signs BP (!) 175/106    Pulse 65    Temp 97.7 F (36.5 C) (Oral)    Resp (!) 24    Ht _0  (1.778 m)    Wt 54.4 kg    SpO2 99%    BMI 17.22 kg/m   Physical Exam Vitals and nursing note reviewed.  Constitutional:      General: She is not in acute distress.    Appearance: She is well-developed. She is not ill-appearing, toxic-appearing or diaphoretic.  HENT:     Head: Normocephalic and atraumatic.     Right Ear: External ear normal.     Left Ear: External ear normal.  Eyes:     Conjunctiva/sclera: Conjunctivae normal.     Pupils: Pupils are equal, round, and reactive to light.  Neck:     Trachea: Phonation normal.  Cardiovascular:     Rate and Rhythm: Normal  rate and regular rhythm.     Heart sounds: Normal heart sounds.  Pulmonary:     Effort: Pulmonary effort is normal.     Breath sounds: Normal breath sounds.  Abdominal:     General: There is no distension.     Palpations: Abdomen is soft.     Tenderness: There is no abdominal tenderness.  Musculoskeletal:        General: Normal range of motion.     Cervical back: Normal range of motion and neck supple.  Skin:    General: Skin is warm and dry.  Neurological:     Mental Status:  She is alert and oriented to person, place, and time.     Cranial Nerves: No cranial nerve deficit.     Sensory: No sensory deficit.     Motor: No abnormal muscle tone.     Coordination: Coordination normal.  Psychiatric:        Mood and Affect: Mood normal.        Behavior: Behavior normal.        Thought Content: Thought content normal.        Judgment: Judgment normal.    ED Results / Procedures / Treatments   Labs (all labs ordered are listed, but only abnormal results are displayed) Labs Reviewed  BASIC METABOLIC PANEL - Abnormal; Notable for the following components:      Result Value   Glucose, Bld 190 (*)    BUN 33 (*)    Creatinine, Ser 2.40 (*)    GFR, Estimated 20 (*)    All other components within normal limits  CBC WITH DIFFERENTIAL/PLATELET - Abnormal; Notable for the following components:   Hemoglobin 11.2 (*)    HCT 35.8 (*)    All other components within normal limits  TROPONIN I (HIGH SENSITIVITY)  TROPONIN I (HIGH SENSITIVITY)    EKG EKG Interpretation  Date/Time:  Monday April 25 2021 11:13:04 EST Ventricular Rate:  85 PR Interval:    QRS Duration: 107 QT Interval:  383 QTC Calculation: 456 R Axis:   -24 Text Interpretation: Atrial fibrillation LVH with secondary repolarization abnormality Inferior infarct, old Anterior infarct, old since last tracing no significant change Confirmed by Daleen Bo [phone]) on 04/25/2021 11:20:42 AM  Radiology DG Chest Port 1  View  Result Date: 04/25/2021 CLINICAL DATA:  Chest pain.  Diabetic EXAM: PORTABLE CHEST 1 VIEW COMPARISON:  None. FINDINGS: Stable enlarged cardiac silhouette. Ectatic aorta. No effusion, infiltrate or pneumothorax. No acute osseous abnormality. IMPRESSION: No acute cardiopulmonary findings. Electronically Signed   By: Suzy Bouchard M.D.   On: 04/25/2021 14:02    Procedures Procedures   Medications Ordered in ED Medications  furosemide (LASIX) tablet 40 mg (40 mg Oral Given 04/25/21 1416)  hydrALAZINE (APRESOLINE) tablet 100 mg (100 mg Oral Given 04/25/21 1416)  metoprolol tartrate (LOPRESSOR) tablet 25 mg (25 mg Oral Given 04/25/21 1416)  insulin glargine-yfgn (SEMGLEE) injection 25 Units (25 Units Subcutaneous Given 04/25/21 1720)  acetaminophen (TYLENOL) tablet 650 mg (650 mg Oral Given 04/25/21 1416)    ED Course  I have reviewed the triage vital signs and the nursing notes.  Pertinent labs & imaging results that were available during my care of the patient were reviewed by me and considered in my medical decision making (see chart for details).    MDM Rules/Calculators/A&P                          Patient Vitals for the past 24 hrs:  BP Temp Temp src Pulse Resp SpO2 Height Weight  04/25/21 1830 (!) 175/106 -- -- 65 (!) 24 99 % -- --  04/25/21 1800 (!) 191/109 -- -- 69 20 100 % -- --  04/25/21 1730 (!) 188/103 -- -- 71 17 99 % -- --  04/25/21 1700 (!) 192/111 -- -- 69 (!) 26 98 % -- --  04/25/21 1630 (!) 152/123 -- -- 73 15 100 % -- --  04/25/21 1600 (!) 167/104 -- -- 68 (!) 26 100 % -- --  04/25/21 1530 (!) 159/93 -- -- 72 14 100 % -- --  04/25/21 1515 -- -- -- 75 20 99 % -- --  04/25/21 1500 (!) 163/124 -- -- (!) 107 (!) 0 99 % -- --  04/25/21 1445 -- -- -- 91 (!) 30 99 % -- --  04/25/21 1430 (!) 229/133 -- -- (!) 112 (!) 21 99 % -- --  04/25/21 1415 -- -- -- (!) 130 (!) 22 100 % -- --  04/25/21 1400 (!) 208/105 -- -- 84 (!) 26 100 % -- --  04/25/21 1345 -- -- -- 92  (!) 31 100 % -- --  04/25/21 1330 (!) 190/123 -- -- 78 (!) 21 100 % -- --  04/25/21 1315 -- -- -- 68 (!) 33 99 % -- --  04/25/21 1300 (!) 179/154 -- -- 71 (!) 22 100 % -- --  04/25/21 1245 -- -- -- 69 14 99 % -- --  04/25/21 1230 (!) 204/108 -- -- 93 16 98 % -- --  04/25/21 1215 -- -- -- 67 12 98 % -- --  04/25/21 1200 (!) 184/110 -- -- (!) 105 17 100 % -- --  04/25/21 1145 -- -- -- 68 (!) 25 98 % -- --  04/25/21 1130 (!) 195/136 -- -- (!) 115 (!) 8 98 % -- --  04/25/21 1115 -- -- -- 88 20 100 % -- --  04/25/21 1111 -- -- -- 93 13 98 % -- --  04/25/21 1110 -- 97.7 F (36.5 C) Oral -- -- -- -- --  04/25/21 1108 (!) 162/96 -- -- 88 -- -- -- --  04/25/21 1101 -- -- -- -- -- -- _0  (1.778 m) 54.4 kg    At the time of discharge -reevaluation with update and discussion. After initial assessment and treatment, an updated evaluation reveals no further complaints.  Findings discussed and questions answered. Daleen Bo   Medical Decision Making:  This patient is presenting for evaluation of nonspecific symptoms, which does require a range of treatment options, and is a complaint that involves a moderate risk of morbidity and mortality. The differential diagnoses include acute on chronic back injury, cardiac disorder, pulmonary disorder, malaise. I decided to review old records, and in summary elderly female presenting with low back pain for 3 months and onset of chest pain over the last couple of days.  No other pertinent history..  I did not require additional historical information from anyone.  Clinical Laboratory Tests Ordered, included CBC, Metabolic panel, and troponin . Review indicates normal except hemoglobin slightly low, glucose high, BUN high, creatinine high, GFR low. Radiologic Tests Ordered, included chest x-ray.  I independently Visualized: Radiographic images, which show no acute abnormalities  \  Critical Interventions-clinical evaluation, laboratory testing,  radiography, observation and reassessment  After These Interventions, the Patient was reevaluated and was found stable for discharge.  Doubt worsening chronic back pain.  No evidence for ACS, PE or pneumonia.  No indication for further ED evaluation or hospitalization at this time.  Note that after patient was dispositioned by me, the patient wanted to talk to Education officer, museum.  I asked the nurse to consult social work and have them call the patient tomorrow  CRITICAL CARE- no Performed by: Daleen Bo  Nursing Notes Reviewed/ Care Coordinated Applicable Imaging Reviewed Interpretation of Laboratory Data incorporated into ED treatment  The patient appears reasonably screened and/or stabilized for discharge and I doubt any other medical condition or other [facility] requiring further screening, evaluation, or treatment in the ED at this time prior to discharge.  Plan: Home Medications-continue usual, use Tylenol for pain; Home Treatments-rest, gradual advance activity; return here if the recommended treatment, does not improve the symptoms; Recommended follow up-PCP follow-up as needed        Final Clinical Impression(s) / ED Diagnoses Final diagnoses:  Nonspecific chest pain  Low back pain without sciatica, unspecified back pain laterality, unspecified chronicity    Rx / DC Orders ED Discharge Orders     None        Daleen Bo, MD 04/25/21 1939

## 2021-04-25 NOTE — ED Notes (Signed)
Crackers and ginger ale given to pt 

## 2021-04-25 NOTE — Discharge Instructions (Signed)
The testing today does not show any serious problems.  Take Tylenol for pain.  Take all of your medicines as regularly prescribed.  Call your PCP for follow-up appointment in 1 or 2 weeks.

## 2021-08-08 DIAGNOSIS — N184 Chronic kidney disease, stage 4 (severe): Secondary | ICD-10-CM | POA: Diagnosis not present

## 2021-08-08 DIAGNOSIS — R000 Tachycardia, unspecified: Secondary | ICD-10-CM | POA: Diagnosis not present

## 2021-08-08 DIAGNOSIS — R5383 Other fatigue: Secondary | ICD-10-CM | POA: Diagnosis not present

## 2021-08-08 DIAGNOSIS — E1169 Type 2 diabetes mellitus with other specified complication: Secondary | ICD-10-CM | POA: Diagnosis not present

## 2021-08-08 DIAGNOSIS — I479 Paroxysmal tachycardia, unspecified: Secondary | ICD-10-CM | POA: Diagnosis not present

## 2021-08-08 DIAGNOSIS — I10 Essential (primary) hypertension: Secondary | ICD-10-CM | POA: Diagnosis not present

## 2021-10-26 ENCOUNTER — Other Ambulatory Visit: Payer: Self-pay

## 2021-10-26 ENCOUNTER — Encounter (HOSPITAL_COMMUNITY): Payer: Self-pay

## 2021-10-26 ENCOUNTER — Emergency Department (HOSPITAL_COMMUNITY): Payer: Medicare Other

## 2021-10-26 ENCOUNTER — Inpatient Hospital Stay (HOSPITAL_COMMUNITY)
Admission: EM | Admit: 2021-10-26 | Discharge: 2021-10-28 | DRG: 682 | Disposition: A | Discharge status: Home / Self Care | Payer: Medicare Other | Attending: Internal Medicine | Admitting: Internal Medicine

## 2021-10-26 DIAGNOSIS — N184 Chronic kidney disease, stage 4 (severe): Secondary | ICD-10-CM | POA: Diagnosis not present

## 2021-10-26 DIAGNOSIS — N179 Acute kidney failure, unspecified: Secondary | ICD-10-CM | POA: Diagnosis not present

## 2021-10-26 DIAGNOSIS — E1122 Type 2 diabetes mellitus with diabetic chronic kidney disease: Secondary | ICD-10-CM | POA: Diagnosis present

## 2021-10-26 DIAGNOSIS — I129 Hypertensive chronic kidney disease with stage 1 through stage 4 chronic kidney disease, or unspecified chronic kidney disease: Secondary | ICD-10-CM | POA: Diagnosis present

## 2021-10-26 DIAGNOSIS — E785 Hyperlipidemia, unspecified: Secondary | ICD-10-CM | POA: Diagnosis not present

## 2021-10-26 DIAGNOSIS — E860 Dehydration: Secondary | ICD-10-CM | POA: Diagnosis not present

## 2021-10-26 DIAGNOSIS — K649 Unspecified hemorrhoids: Secondary | ICD-10-CM | POA: Diagnosis not present

## 2021-10-26 DIAGNOSIS — Z87440 Personal history of urinary (tract) infections: Secondary | ICD-10-CM | POA: Diagnosis not present

## 2021-10-26 DIAGNOSIS — D649 Anemia, unspecified: Secondary | ICD-10-CM | POA: Diagnosis present

## 2021-10-26 DIAGNOSIS — R946 Abnormal results of thyroid function studies: Secondary | ICD-10-CM | POA: Diagnosis present

## 2021-10-26 DIAGNOSIS — Z79899 Other long term (current) drug therapy: Secondary | ICD-10-CM | POA: Diagnosis not present

## 2021-10-26 DIAGNOSIS — Z794 Long term (current) use of insulin: Secondary | ICD-10-CM | POA: Diagnosis not present

## 2021-10-26 DIAGNOSIS — G9341 Metabolic encephalopathy: Secondary | ICD-10-CM | POA: Diagnosis present

## 2021-10-26 DIAGNOSIS — Z823 Family history of stroke: Secondary | ICD-10-CM | POA: Diagnosis not present

## 2021-10-26 DIAGNOSIS — E1165 Type 2 diabetes mellitus with hyperglycemia: Secondary | ICD-10-CM | POA: Diagnosis present

## 2021-10-26 DIAGNOSIS — I4891 Unspecified atrial fibrillation: Secondary | ICD-10-CM | POA: Diagnosis present

## 2021-10-26 DIAGNOSIS — Z743 Need for continuous supervision: Secondary | ICD-10-CM | POA: Diagnosis not present

## 2021-10-26 DIAGNOSIS — N183 Chronic kidney disease, stage 3 unspecified: Secondary | ICD-10-CM | POA: Diagnosis present

## 2021-10-26 DIAGNOSIS — Z8673 Personal history of transient ischemic attack (TIA), and cerebral infarction without residual deficits: Secondary | ICD-10-CM

## 2021-10-26 DIAGNOSIS — E0590 Thyrotoxicosis, unspecified without thyrotoxic crisis or storm: Secondary | ICD-10-CM

## 2021-10-26 DIAGNOSIS — Z6821 Body mass index (BMI) 21.0-21.9, adult: Secondary | ICD-10-CM | POA: Diagnosis not present

## 2021-10-26 DIAGNOSIS — E1110 Type 2 diabetes mellitus with ketoacidosis without coma: Secondary | ICD-10-CM | POA: Diagnosis present

## 2021-10-26 DIAGNOSIS — E440 Moderate protein-calorie malnutrition: Secondary | ICD-10-CM | POA: Diagnosis present

## 2021-10-26 DIAGNOSIS — I10 Essential (primary) hypertension: Secondary | ICD-10-CM | POA: Diagnosis present

## 2021-10-26 DIAGNOSIS — I482 Chronic atrial fibrillation, unspecified: Secondary | ICD-10-CM

## 2021-10-26 DIAGNOSIS — R9431 Abnormal electrocardiogram [ECG] [EKG]: Secondary | ICD-10-CM | POA: Diagnosis not present

## 2021-10-26 DIAGNOSIS — R531 Weakness: Secondary | ICD-10-CM | POA: Diagnosis not present

## 2021-10-26 DIAGNOSIS — Z7982 Long term (current) use of aspirin: Secondary | ICD-10-CM

## 2021-10-26 DIAGNOSIS — W19XXXA Unspecified fall, initial encounter: Secondary | ICD-10-CM | POA: Diagnosis not present

## 2021-10-26 LAB — URINALYSIS, ROUTINE W REFLEX MICROSCOPIC
Bilirubin Urine: NEGATIVE
Glucose, UA: 50 mg/dL — AB
Hgb urine dipstick: NEGATIVE
Ketones, ur: NEGATIVE mg/dL
Nitrite: NEGATIVE
Protein, ur: >=300 mg/dL — AB
Specific Gravity, Urine: 1.012 (ref 1.005–1.030)
pH: 5 (ref 5.0–8.0)

## 2021-10-26 LAB — GLUCOSE, CAPILLARY
Glucose-Capillary: 169 mg/dL — ABNORMAL HIGH (ref 70–99)
Glucose-Capillary: 215 mg/dL — ABNORMAL HIGH (ref 70–99)

## 2021-10-26 LAB — CBC WITH DIFFERENTIAL/PLATELET
Abs Immature Granulocytes: 0.02 10*3/uL (ref 0.00–0.07)
Basophils Absolute: 0 10*3/uL (ref 0.0–0.1)
Basophils Relative: 1 %
Eosinophils Absolute: 0.2 10*3/uL (ref 0.0–0.5)
Eosinophils Relative: 2 %
HCT: 31.4 % — ABNORMAL LOW (ref 36.0–46.0)
Hemoglobin: 10.1 g/dL — ABNORMAL LOW (ref 12.0–15.0)
Immature Granulocytes: 0 %
Lymphocytes Relative: 33 %
Lymphs Abs: 2.2 10*3/uL (ref 0.7–4.0)
MCH: 29.1 pg (ref 26.0–34.0)
MCHC: 32.2 g/dL (ref 30.0–36.0)
MCV: 90.5 fL (ref 80.0–100.0)
Monocytes Absolute: 0.3 10*3/uL (ref 0.1–1.0)
Monocytes Relative: 4 %
Neutro Abs: 4 10*3/uL (ref 1.7–7.7)
Neutrophils Relative %: 60 %
Platelets: 216 10*3/uL (ref 150–400)
RBC: 3.47 MIL/uL — ABNORMAL LOW (ref 3.87–5.11)
RDW: 14.1 % (ref 11.5–15.5)
WBC: 6.7 10*3/uL (ref 4.0–10.5)
nRBC: 0 % (ref 0.0–0.2)

## 2021-10-26 LAB — TROPONIN I (HIGH SENSITIVITY)
Troponin I (High Sensitivity): 19 ng/L — ABNORMAL HIGH (ref <18)
Troponin I (High Sensitivity): 17 ng/L (ref <18)

## 2021-10-26 LAB — HEMOGLOBIN A1C
Hgb A1c MFr Bld: 6.7 % — ABNORMAL HIGH (ref 4.8–5.6)
Mean Plasma Glucose: 145.59 mg/dL

## 2021-10-26 LAB — TSH: TSH: 5.384 u[IU]/mL — ABNORMAL HIGH (ref 0.350–4.500)

## 2021-10-26 LAB — COMPREHENSIVE METABOLIC PANEL
ALT: 15 U/L (ref 0–44)
AST: 18 U/L (ref 15–41)
Albumin: 3.4 g/dL — ABNORMAL LOW (ref 3.5–5.0)
Alkaline Phosphatase: 45 U/L (ref 38–126)
Anion gap: 9 (ref 5–15)
BUN: 59 mg/dL — ABNORMAL HIGH (ref 8–23)
CO2: 25 mmol/L (ref 22–32)
Calcium: 9 mg/dL (ref 8.9–10.3)
Chloride: 106 mmol/L (ref 98–111)
Creatinine, Ser: 3.47 mg/dL — ABNORMAL HIGH (ref 0.44–1.00)
GFR, Estimated: 12 mL/min — ABNORMAL LOW (ref >60)
Glucose, Bld: 181 mg/dL — ABNORMAL HIGH (ref 70–99)
Potassium: 3.8 mmol/L (ref 3.5–5.1)
Sodium: 140 mmol/L (ref 135–145)
Total Bilirubin: 0.8 mg/dL (ref 0.3–1.2)
Total Protein: 6.6 g/dL (ref 6.5–8.1)

## 2021-10-26 LAB — I-STAT CHEM 8, ED
BUN: 51 mg/dL — ABNORMAL HIGH (ref 8–23)
Calcium, Ion: 1.18 mmol/L (ref 1.15–1.40)
Chloride: 108 mmol/L (ref 98–111)
Creatinine, Ser: 3.8 mg/dL — ABNORMAL HIGH (ref 0.44–1.00)
Glucose, Bld: 172 mg/dL — ABNORMAL HIGH (ref 70–99)
HCT: 31 % — ABNORMAL LOW (ref 36.0–46.0)
Hemoglobin: 10.5 g/dL — ABNORMAL LOW (ref 12.0–15.0)
Potassium: 3.9 mmol/L (ref 3.5–5.1)
Sodium: 141 mmol/L (ref 135–145)
TCO2: 24 mmol/L (ref 22–32)

## 2021-10-26 MED ORDER — HYDRALAZINE HCL 25 MG PO TABS
100.0000 mg | ORAL_TABLET | Freq: Two times a day (BID) | ORAL | Status: DC
Start: 2021-10-26 — End: 2021-10-26

## 2021-10-26 MED ORDER — ONDANSETRON HCL 4 MG PO TABS: 4.0000 mg | ORAL_TABLET | Freq: Four times a day (QID) | ORAL | Status: DC | PRN

## 2021-10-26 MED ORDER — HYDRALAZINE HCL 20 MG/ML IJ SOLN
10.0000 mg | Freq: Once | INTRAMUSCULAR | Status: AC
  Administered 2021-10-26: 10 mg via INTRAVENOUS
  Filled 2021-10-26: qty 1

## 2021-10-26 MED ORDER — ATORVASTATIN CALCIUM 40 MG PO TABS
40.0000 mg | ORAL_TABLET | Freq: Every day | ORAL | Status: DC
  Administered 2021-10-26 – 2021-10-27 (×2): 40 mg via ORAL
  Filled 2021-10-26 (×2): qty 1

## 2021-10-26 MED ORDER — ACETAMINOPHEN 650 MG RE SUPP: 650.0000 mg | Freq: Four times a day (QID) | RECTAL | Status: DC | PRN

## 2021-10-26 MED ORDER — APIXABAN 2.5 MG PO TABS
2.5000 mg | ORAL_TABLET | Freq: Two times a day (BID) | ORAL | Status: DC
  Administered 2021-10-26: 2.5 mg via ORAL
  Filled 2021-10-26: qty 1

## 2021-10-26 MED ORDER — INSULIN ASPART 100 UNIT/ML IJ SOLN
0.0000 [IU] | Freq: Three times a day (TID) | INTRAMUSCULAR | Status: DC
  Administered 2021-10-26 – 2021-10-27 (×2): 2 [IU] via SUBCUTANEOUS
  Administered 2021-10-27: 3 [IU] via SUBCUTANEOUS
  Administered 2021-10-28: 2 [IU] via SUBCUTANEOUS

## 2021-10-26 MED ORDER — FLUCONAZOLE 150 MG PO TABS
150.0000 mg | ORAL_TABLET | Freq: Once | ORAL | Status: DC
  Filled 2021-10-26: qty 1

## 2021-10-26 MED ORDER — SODIUM CHLORIDE 0.9 % IV BOLUS
1000.0000 mL | Freq: Once | INTRAVENOUS | Status: AC
  Administered 2021-10-26: 1000 mL via INTRAVENOUS

## 2021-10-26 MED ORDER — HYDRALAZINE HCL 25 MG PO TABS
100.0000 mg | ORAL_TABLET | Freq: Three times a day (TID) | ORAL | Status: DC
  Administered 2021-10-26 – 2021-10-28 (×7): 100 mg via ORAL
  Filled 2021-10-26 (×7): qty 4

## 2021-10-26 MED ORDER — FLUCONAZOLE 100 MG PO TABS
100.0000 mg | ORAL_TABLET | Freq: Every day | ORAL | Status: DC
  Administered 2021-10-26 – 2021-10-28 (×3): 100 mg via ORAL
  Filled 2021-10-26 (×3): qty 1

## 2021-10-26 MED ORDER — LACTATED RINGERS IV SOLN: INTRAVENOUS | Status: AC

## 2021-10-26 MED ORDER — LABETALOL HCL 5 MG/ML IV SOLN
10.0000 mg | Freq: Once | INTRAVENOUS | Status: AC
  Administered 2021-10-26: 10 mg via INTRAVENOUS
  Filled 2021-10-26: qty 4

## 2021-10-26 MED ORDER — HYDRALAZINE HCL 20 MG/ML IJ SOLN
10.0000 mg | INTRAMUSCULAR | Status: DC | PRN
  Administered 2021-10-26 – 2021-10-27 (×2): 10 mg via INTRAVENOUS
  Filled 2021-10-26 (×2): qty 1

## 2021-10-26 MED ORDER — INSULIN GLARGINE-YFGN 100 UNIT/ML ~~LOC~~ SOLN
15.0000 [IU] | Freq: Every day | SUBCUTANEOUS | Status: DC
  Administered 2021-10-26 – 2021-10-27 (×2): 15 [IU] via SUBCUTANEOUS
  Filled 2021-10-26 (×3): qty 0.15

## 2021-10-26 MED ORDER — ONDANSETRON HCL 4 MG/2ML IJ SOLN: 4.0000 mg | Freq: Four times a day (QID) | INTRAMUSCULAR | Status: DC | PRN

## 2021-10-26 MED ORDER — ACETAMINOPHEN 325 MG PO TABS
650.0000 mg | ORAL_TABLET | Freq: Four times a day (QID) | ORAL | Status: DC | PRN
  Administered 2021-10-26: 650 mg via ORAL
  Filled 2021-10-26: qty 2

## 2021-10-26 MED ORDER — INSULIN ASPART 100 UNIT/ML IJ SOLN
0.0000 [IU] | Freq: Every day | INTRAMUSCULAR | Status: DC
  Administered 2021-10-26: 2 [IU] via SUBCUTANEOUS

## 2021-10-26 MED ORDER — ASPIRIN 81 MG PO TBEC
81.0000 mg | DELAYED_RELEASE_TABLET | Freq: Every day | ORAL | Status: DC
  Administered 2021-10-26 – 2021-10-27 (×2): 81 mg via ORAL
  Filled 2021-10-26 (×2): qty 1

## 2021-10-26 NOTE — ED Triage Notes (Signed)
Patient via EMS for weakness and altered mental status that started the previous night after a fall. States that she just sat down in the floor. Patient states she did not even want to come to the ED but her daughter made her. Patient is alert and oriented to person, place, and time.

## 2021-10-26 NOTE — H&P (Signed)
History and Physical    DAVETTA OLLIFF HYW:[id number] DOB: 09/01/1935 DOA: 10/26/2021  PCP: Iona Beard, MD   Patient coming from: Home  Chief Complaint: Weakness/confusion  HPI: Chelsea Meza is a 86 y.o. female with medical history significant for CKD stage IV, type 2 diabetes, dyslipidemia, and hypertension, who presented to the ED via her daughter on account of some generalized weakness and confusion.  Her weakness is worsened over the last few days and her urine has been more dark and pungent which made her suspicious for possible UTI.  She denies any diarrhea, nausea, vomiting, abdominal pain, chest pain, or shortness of breath.  She is noted to have some poor p.o. intake over the past 24-48 hours.   ED Course: Vital signs stable and patient afebrile.  EKG showing atrial fibrillation which appears to be new.  Chest x-ray with no acute findings.  Creatinine elevated to 3.8 and baseline near 3.  Hemoglobin 10.5 and BUN 51.  She has been given some Diflucan on account of suspicion for yeast UTI.  Review of Systems: Reviewed as noted above, otherwise negative.  Past Medical History:  Diagnosis Date   Anemia    CKD (chronic kidney disease) stage 3, GFR 30-59 ml/min ([name])    Diabetes mellitus without complication ([name])    Hyperlipidemia    Hypertension    TIA (transient ischemic attack)     Past Surgical History:  Procedure Laterality Date   CHOLECYSTECTOMY       reports that she has never smoked. She has never used smokeless tobacco. She reports that she does not drink alcohol and does not use drugs.  No Known Allergies  Family History  Problem Relation Age of Onset   Stroke Mother    Stroke Maternal Grandmother     Prior to Admission medications   Medication Sig Start Date End Date Taking? Authorizing Provider  aspirin EC 81 MG tablet Take 81 mg by mouth daily.   Yes [provider]  atorvastatin (LIPITOR) 40 MG tablet Take 40 mg by mouth at bedtime.  10/19/21  Yes [provider]  atorvastatin (LIPITOR) 80 MG tablet Take 80 mg by mouth at bedtime. 12/18/20  Yes [provider]  Cyanocobalamin (VITAMIN B-12 CR PO) Take 1 tablet by mouth daily.   Yes [provider]  furosemide (LASIX) 40 MG tablet Take 1 tablet (40 mg total) by mouth daily. 02/28/21  Yes Johnson, Clanford L, MD  GVOKE PFS 0.5 MG/0.1ML SOSY Inject 0.5 mg into the skin as needed (low bs). 02/08/21  Yes [provider]  hydrALAZINE (APRESOLINE) 100 MG tablet Take 1 tablet (100 mg total) by mouth 3 (three) times daily. Patient taking differently: Take 100 mg by mouth 2 (two) times daily. 10/22/18  Yes Tat, Shanon Brow, MD  insulin glargine (LANTUS) 100 UNIT/ML Solostar Pen INJECT 25 UNITS INTO THE SKIN DAILY. Patient taking differently: Inject 25 Units into the skin daily. 06/23/20 10/26/21 Yes Ghimire, Henreitta Leber, MD  insulin lispro (HUMALOG) 100 UNIT/ML KwikPen Inject 6 Units into the skin in the morning, at noon, and at bedtime. 08/13/21  Yes [provider]  lidocaine (LIDODERM) 5 % Place 1 patch onto the skin daily. Remove & Discard patch within 12 hours or as directed by MD 04/16/21  Yes Idol, Almyra Free, PA-C  Accu-Chek FastClix Lancets MISC  04/09/20   [provider]  ACCU-CHEK GUIDE test strip USE TO TEST TWICE DAILY.AL 07/05/20   [provider]  Blood Glucose Monitoring  Suppl (ACCU-CHEK GUIDE) w/Device KIT USE AS DIRECTED.I 09/03/20   [provider]  Insulin Pen Needle (PEN NEEDLES) 32G X 4 MM MISC Use as directed 06/23/20   Jonetta Osgood, MD    Physical Exam: Vitals:   10/26/21 1300 10/26/21 1330 10/26/21 1345 10/26/21 1400  BP: (!) 191/92     Pulse: 62 62 67 71  Resp: '14 19 18 ' (!) 22  Temp:      TempSrc:      SpO2: 99% 99% 99% 99%  Weight:      Height:        Constitutional: NAD, calm, comfortable Vitals:   10/26/21 1300 10/26/21 1330 10/26/21 1345 10/26/21 1400  BP: (!) 191/92     Pulse: 62 62 67  71  Resp: '14 19 18 ' (!) 22  Temp:      TempSrc:      SpO2: 99% 99% 99% 99%  Weight:      Height:       Eyes: lids and conjunctivae normal Neck: normal, supple Respiratory: clear to auscultation bilaterally. Normal respiratory effort. No accessory muscle use.  Cardiovascular: Regular rate and rhythm, no murmurs. Abdomen: no tenderness, no distention. Bowel sounds positive.  Musculoskeletal:  No edema. Skin: no rashes, lesions, ulcers.  Psychiatric: Flat affect  Labs on Admission: I have personally reviewed following labs and imaging studies  CBC: Recent Labs  Lab 10/26/21 1001 10/26/21 1037  WBC 6.7  --   NEUTROABS 4.0  --   HGB 10.1* 10.5*  HCT 31.4* 31.0*  MCV 90.5  --   PLT 216  --    Basic Metabolic Panel: Recent Labs  Lab 10/26/21 1001 10/26/21 1037  NA 140 141  K 3.8 3.9  CL 106 108  CO2 25  --   GLUCOSE 181* 172*  BUN 59* 51*  CREATININE 3.47* 3.80*  CALCIUM 9.0  --    GFR: Estimated Creatinine Clearance: 9.5 mL/min (A) (by C-G formula based on SCr of 3.8 mg/dL (H)). Liver Function Tests: Recent Labs  Lab 10/26/21 1001  AST 18  ALT 15  ALKPHOS 45  BILITOT 0.8  PROT 6.6  ALBUMIN 3.4*   No results for input(s): "LIPASE", "AMYLASE" in the last 168 hours. No results for input(s): "AMMONIA" in the last 168 hours. Coagulation Profile: No results for input(s): "INR", "PROTIME" in the last 168 hours. Cardiac Enzymes: No results for input(s): "CKTOTAL", "CKMB", "CKMBINDEX", "TROPONINI" in the last 168 hours. BNP (last 3 results) No results for input(s): "PROBNP" in the last 8760 hours. HbA1C: No results for input(s): "HGBA1C" in the last 72 hours. CBG: No results for input(s): "GLUCAP" in the last 168 hours. Lipid Profile: No results for input(s): "CHOL", "HDL", "LDLCALC", "TRIG", "CHOLHDL", "LDLDIRECT" in the last 72 hours. Thyroid Function Tests: No results for input(s): "TSH", "T4TOTAL", "FREET4", "T3FREE", "THYROIDAB" in the last 72  hours. Anemia Panel: No results for input(s): "VITAMINB12", "FOLATE", "FERRITIN", "TIBC", "IRON", "RETICCTPCT" in the last 72 hours. Urine analysis:    Component Value Date/Time   COLORURINE YELLOW 10/26/2021 1005   APPEARANCEUR CLOUDY (A) 10/26/2021 1005   LABSPEC 1.012 10/26/2021 1005   PHURINE 5.0 10/26/2021 1005   GLUCOSEU 50 (A) 10/26/2021 1005   HGBUR NEGATIVE 10/26/2021 1005   BILIRUBINUR NEGATIVE 10/26/2021 1005   KETONESUR NEGATIVE 10/26/2021 1005   PROTEINUR >=300 (A) 10/26/2021 1005   UROBILINOGEN 0.2 07/13/2012 1236   NITRITE NEGATIVE 10/26/2021 1005   LEUKOCYTESUR LARGE (A) 10/26/2021 1005    Radiological Exams  on Admission: DG Chest Portable 1 View  Result Date: 10/26/2021 CLINICAL DATA:  Weakness EXAM: PORTABLE CHEST 1 VIEW COMPARISON:  04/25/2021 FINDINGS: Cardiac enlargement unchanged. Normal vascularity. Lungs remain clear without infiltrate or effusion. IMPRESSION: No active disease. Electronically Signed   By: Franchot Gallo M.D.   On: 10/26/2021 11:56    EKG: Independently reviewed. Afib 70bpm.  Assessment/Plan Principal Problem:   AKI (acute kidney injury) ([place]) Active Problems:   DKA, type 2 ([name])   Essential hypertension   CKD (chronic kidney disease) stage 3, GFR 30-59 ml/min ([name])   Hyperlipidemia   Malnutrition of moderate degree    AKI on CKD IV -Likely prerenal from dehydration -Continue IV hydration -Avoid nephrotoxic agents -Monitor strict I's and O's -Follow a.m. labs  Acute metabolic encephalopathy -Likely related to above -Continue to monitor -Appears to be improving near baseline  New onset atrial fibrillation -Check TSH -Check 2D echocardiogram -Monitor on telemetry -CHA2DS2-VASc score of 6 or greater and will start on anticoagulation  Possible yeast UTI -Given Diflucan x1 dose in ED -Continue Diflucan while admitted  Type 2 DM with mild hyperglycemia -SSI  Hypertension -Continue to monitor and hold home Lasix -IV  hydralazine for significant elevations  Dyslipidemia -Continue statin  Moderate malnutrition -Dietitian consultation  DVT prophylaxis: Eliquis Code Status: Full Family Communication: None at bedside Disposition Plan:Admit for hydration/AKI Consults called:None Admission status: Inpatient, Tele  Severity of Illness: The appropriate patient status for this patient is INPATIENT. Inpatient status is judged to be reasonable and necessary in order to provide the required intensity of service to ensure the patient's safety. The patient's presenting symptoms, physical exam findings, and initial radiographic and laboratory data in the context of their chronic comorbidities is felt to place them at high risk for further clinical deterioration. Furthermore, it is not anticipated that the patient will be medically stable for discharge from the hospital within 2 midnights of admission.   * I certify that at the point of admission it is my clinical judgment that the patient will require inpatient hospital care spanning beyond 2 midnights from the point of admission due to high intensity of service, high risk for further deterioration and high frequency of surveillance required.*   Tysin Salada D Justa Hatchell DO [facility]  If 7PM-7AM, please contact night-coverage [email or web address]  10/26/2021, 2:42 PM

## 2021-10-26 NOTE — ED Provider Notes (Signed)
[facility] Provider Note   CSN: [id number] Arrival date & time: 10/26/21  0947     History  Chief Complaint  Patient presents with   Altered Mental Status    Chelsea Meza is a 86 y.o. female with a history including type 2 diabetes, history of DKA, chronic kidney disease, PSVT, hypertension, CHF, history of seizures, history of metabolic encephalopathy, history of UTIs presenting for evaluation of generalized weakness and altered mental status per daughter's report.  She states that her mother has had generalized weakness which has been worse over the past several days along with a dark and pungent urine making her suspicious for possible UTI which she has had in the past.  She states that yesterday she was using her walker when she became weak and simply sat down on the floor and did not want to get up.  She denies injury or fall.  She has had reduced p.o. intake over the past 24 hours.  Denies abdominal pain, chest pain, shortness of breath denies headache.  Denies diarrhea, endorses has some chronic constipation.  She normally eats breakfast or at least has a protein shake, she did not have any p.o. intake prior to arrival today.  The history is provided by the patient and a relative (daughter, primary caregiver at bedside).       Home Medications Prior to Admission medications   Medication Sig Start Date End Date Taking? Authorizing Provider  Accu-Chek FastClix Lancets MISC  04/09/20   [provider]  ACCU-CHEK GUIDE test strip USE TO TEST TWICE DAILY.AL 07/05/20   [provider]  aspirin EC 81 MG tablet Take 81 mg by mouth daily.    [provider]  atorvastatin (LIPITOR) 80 MG tablet Take 80 mg by mouth at bedtime. 12/18/20   [provider]  Blood Glucose Monitoring Suppl (ACCU-CHEK GUIDE) w/Device KIT USE AS DIRECTED.I 09/03/20   [provider]  Cyanocobalamin (VITAMIN B-12 CR PO) Take 1 tablet by mouth daily.     [provider]  furosemide (LASIX) 40 MG tablet Take 1 tablet (40 mg total) by mouth daily. 02/28/21   Johnson, Clanford L, MD  GVOKE PFS 0.5 MG/0.1ML SOSY Inject 0.5 mg into the skin as needed (low bs). 02/08/21   [provider]  hydrALAZINE (APRESOLINE) 100 MG tablet Take 1 tablet (100 mg total) by mouth 3 (three) times daily. Patient taking differently: Take 100 mg by mouth 2 (two) times daily. 10/22/18   Orson Eva, MD  insulin aspart (NOVOLOG) 100 UNIT/ML FlexPen Inject 6 Units into the skin 3 (three) times daily with meals. If eats 50% or more of meal. 02/24/21   Johnson, Clanford L, MD  insulin glargine (LANTUS) 100 UNIT/ML Solostar Pen INJECT 25 UNITS INTO THE SKIN DAILY. Patient taking differently: Inject 25 Units into the skin daily. 06/23/20 06/23/21  Jonetta Osgood, MD  Insulin Pen Needle (PEN NEEDLES) 32G X 4 MM MISC Use as directed 06/23/20   Ghimire, Henreitta Leber, MD  lidocaine (LIDODERM) 5 % Place 1 patch onto the skin daily. Remove & Discard patch within 12 hours or as directed by MD 04/16/21   Evalee Jefferson, PA-C  metoprolol tartrate (LOPRESSOR) 25 MG tablet Take 1 tablet (25 mg total) by mouth 2 (two) times daily. 02/24/21   Johnson, Clanford L, MD  Vitamin D, Ergocalciferol, (DRISDOL) 1.25 MG (50000 UNIT) CAPS capsule Take 50,000 Units by mouth once a week. 01/08/20   [provider]  Allergies    Patient has no known allergies.    Review of Systems   Review of Systems  Constitutional:  Positive for appetite change and fatigue. Negative for fever.  HENT:  Negative for congestion.   Eyes: Negative.   Respiratory:  Negative for chest tightness and shortness of breath.   Cardiovascular:  Negative for chest pain.  Gastrointestinal:  Negative for abdominal pain and nausea.  Genitourinary:  Negative for dysuria.  Musculoskeletal:  Negative for arthralgias, joint swelling and neck pain.  Skin: Negative.  Negative for rash and wound.  Neurological:   Positive for weakness. Negative for dizziness, light-headedness, numbness and headaches.  Psychiatric/Behavioral: Negative.      Physical Exam Updated Vital Signs BP (!) 191/92   Pulse 62   Temp 97.7 F (36.5 C) (Oral)   Resp 19   Ht _0  (1.778 m)   Wt 54.4 kg   SpO2 99%   BMI 17.22 kg/m  Physical Exam Vitals and nursing note reviewed.  Constitutional:      Appearance: She is well-developed.  HENT:     Head: Normocephalic and atraumatic.     Mouth/Throat:     Mouth: Mucous membranes are dry.  Eyes:     Conjunctiva/sclera: Conjunctivae normal.  Cardiovascular:     Rate and Rhythm: Normal rate and regular rhythm.     Heart sounds: Normal heart sounds.  Pulmonary:     Effort: Pulmonary effort is normal.     Breath sounds: Normal breath sounds. No wheezing.  Abdominal:     General: Bowel sounds are normal.     Palpations: Abdomen is soft.     Tenderness: There is no abdominal tenderness. There is no guarding.  Musculoskeletal:        General: Normal range of motion.     Cervical back: Normal range of motion.     Right lower leg: Edema present.     Left lower leg: Edema present.     Comments: Bilateral ankle edema, L>R, chronic.  Skin:    General: Skin is warm and dry.  Neurological:     Mental Status: She is alert.     ED Results / Procedures / Treatments   Labs (all labs ordered are listed, but only abnormal results are displayed) Labs Reviewed  CBC WITH DIFFERENTIAL/PLATELET - Abnormal; Notable for the following components:      Result Value   RBC 3.47 (*)    Hemoglobin 10.1 (*)    HCT 31.4 (*)    All other components within normal limits  COMPREHENSIVE METABOLIC PANEL - Abnormal; Notable for the following components:   Glucose, Bld 181 (*)    BUN 59 (*)    Creatinine, Ser 3.47 (*)    Albumin 3.4 (*)    GFR, Estimated 12 (*)    All other components within normal limits  URINALYSIS, ROUTINE W REFLEX MICROSCOPIC - Abnormal; Notable for the following  components:   APPearance CLOUDY (*)    Glucose, UA 50 (*)    Protein, ur >=300 (*)    Leukocytes,Ua LARGE (*)    Bacteria, UA RARE (*)    All other components within normal limits  I-STAT CHEM 8, ED - Abnormal; Notable for the following components:   BUN 51 (*)    Creatinine, Ser 3.80 (*)    Glucose, Bld 172 (*)    Hemoglobin 10.5 (*)    HCT 31.0 (*)    All other components within normal limits  TROPONIN I (  HIGH SENSITIVITY) - Abnormal; Notable for the following components:   Troponin I (High Sensitivity) 19 (*)    All other components within normal limits  URINE CULTURE  TROPONIN I (HIGH SENSITIVITY)    EKG None  Radiology DG Chest Portable 1 View  Result Date: 10/26/2021 CLINICAL DATA:  Weakness EXAM: PORTABLE CHEST 1 VIEW COMPARISON:  04/25/2021 FINDINGS: Cardiac enlargement unchanged. Normal vascularity. Lungs remain clear without infiltrate or effusion. IMPRESSION: No active disease. Electronically Signed   By: Franchot Gallo M.D.   On: 10/26/2021 11:56    Procedures Procedures    Medications Ordered in ED Medications  fluconazole (DIFLUCAN) tablet 150 mg (has no administration in time range)  sodium chloride 0.9 % bolus 1,000 mL (0 mLs Intravenous Stopped 10/26/21 1240)  labetalol (NORMODYNE) injection 10 mg (10 mg Intravenous Given 10/26/21 1240)    ED Course/ Medical Decision Making/ A&P                           Medical Decision Making Patient with a history of type 2 diabetes, chronic kidney disease, hypertension, CHF presenting with generalized weakness and has acute renal failure.  Her creatinine today is 3.80 with a BUN of 51, baseline creatinine 2.4.  She and her daughter at bedside endorse that she has not been drinking fluids, states she is doing well just to drink a cup of water daily, all of her other fluid intake is Coca-Cola.  She also has a hemoglobin of 10.1, however this hemoglobin is stable in comparison to prior work-ups.  Daughter's concern was  for possible UTI given dark pungent urine, however patient denies dysuria.  Her urine today does show a large amount of leukocytes, rare bacteria, she is nitrite negative.  She does have yeast and uric acid crystals present.  She denies flank pain.  She also denies vaginal discharge or irritation, it is possible she has a yeast cystitis, although this is not a clean catch specimen as she has 11-20 squamous cells present, this is more likely of vaginal contamination.  She was given a dose of Diflucan here.  She will need admission for rehydration and to follow her kidney function.  Amount and/or Complexity of Data Reviewed Labs: ordered.    Details: Details per above. Radiology: ordered.    Details: Chest x-ray is negative for acute infection. ECG/medicine tests: ordered. Discussion of management or test interpretation with external provider(s): Call placed to the hospitalist service for admission. Discussed with Dr. Manuella Ghazi who will admit  Risk Prescription drug management. Decision regarding hospitalization.           Final Clinical Impression(s) / ED Diagnoses Final diagnoses:  Acute renal failure, unspecified acute renal failure type [facility])    Rx / DC Orders ED Discharge Orders     None         Landis Martins 10/26/21 1416    Milton Ferguson, MD 10/26/21 [phone]

## 2021-10-26 NOTE — Progress Notes (Signed)
   10/26/21 1747  Vitals  BP (!) 186/112  MAP (mmHg) 136  BP Method Automatic  Pulse Rate 66  Pulse Rate Source Monitor  MEWS COLOR  MEWS Score Color Green  Oxygen Therapy  SpO2 100 %  MEWS Score  MEWS Temp 0  MEWS Systolic 0  MEWS Pulse 0  MEWS RR 0  MEWS LOC 0  MEWS Score 0

## 2021-10-27 ENCOUNTER — Inpatient Hospital Stay (HOSPITAL_COMMUNITY): Payer: Medicare Other

## 2021-10-27 DIAGNOSIS — I4891 Unspecified atrial fibrillation: Secondary | ICD-10-CM | POA: Diagnosis not present

## 2021-10-27 DIAGNOSIS — N179 Acute kidney failure, unspecified: Secondary | ICD-10-CM | POA: Diagnosis not present

## 2021-10-27 LAB — BASIC METABOLIC PANEL
Anion gap: 7 (ref 5–15)
BUN: 55 mg/dL — ABNORMAL HIGH (ref 8–23)
CO2: 23 mmol/L (ref 22–32)
Calcium: 8.7 mg/dL — ABNORMAL LOW (ref 8.9–10.3)
Chloride: 110 mmol/L (ref 98–111)
Creatinine, Ser: 3.26 mg/dL — ABNORMAL HIGH (ref 0.44–1.00)
GFR, Estimated: 13 mL/min — ABNORMAL LOW (ref >60)
Glucose, Bld: 118 mg/dL — ABNORMAL HIGH (ref 70–99)
Potassium: 3.7 mmol/L (ref 3.5–5.1)
Sodium: 140 mmol/L (ref 135–145)

## 2021-10-27 LAB — GLUCOSE, CAPILLARY
Glucose-Capillary: 87 mg/dL (ref 70–99)
Glucose-Capillary: 170 mg/dL — ABNORMAL HIGH (ref 70–99)
Glucose-Capillary: 221 mg/dL — ABNORMAL HIGH (ref 70–99)
Glucose-Capillary: 177 mg/dL — ABNORMAL HIGH (ref 70–99)

## 2021-10-27 LAB — FOLATE: Folate: 25.3 ng/mL (ref >5.9)

## 2021-10-27 LAB — CBC
HCT: 28.8 % — ABNORMAL LOW (ref 36.0–46.0)
Hemoglobin: 9.1 g/dL — ABNORMAL LOW (ref 12.0–15.0)
MCH: 29.7 pg (ref 26.0–34.0)
MCHC: 31.6 g/dL (ref 30.0–36.0)
MCV: 94.1 fL (ref 80.0–100.0)
Platelets: 197 10*3/uL (ref 150–400)
RBC: 3.06 MIL/uL — ABNORMAL LOW (ref 3.87–5.11)
RDW: 14.1 % (ref 11.5–15.5)
WBC: 6 10*3/uL (ref 4.0–10.5)
nRBC: 0 % (ref 0.0–0.2)

## 2021-10-27 LAB — MAGNESIUM: Magnesium: 2.6 mg/dL — ABNORMAL HIGH (ref 1.7–2.4)

## 2021-10-27 LAB — RETICULOCYTES
Immature Retic Fract: 8.8 % (ref 2.3–15.9)
RBC.: 3.11 MIL/uL — ABNORMAL LOW (ref 3.87–5.11)
Retic Count, Absolute: 46 10*3/uL (ref 19.0–186.0)
Retic Ct Pct: 1.5 % (ref 0.4–3.1)

## 2021-10-27 LAB — ECHOCARDIOGRAM COMPLETE
AR max vel: 2.06 cm2
AV Area VTI: 1.98 cm2
AV Area mean vel: 1.85 cm2
AV Mean grad: 5 mmHg
AV Peak grad: 8.4 mmHg
Ao pk vel: 1.45 m/s
Area-P 1/2: 5.93 cm2
Calc EF: 59.2 %
Height: 70 in
MV VTI: 2.33 cm2
S' Lateral: 3.4 cm
Single Plane A2C EF: 56.7 %
Single Plane A4C EF: 59 %
Weight: 1920 oz

## 2021-10-27 LAB — IRON AND TIBC
Iron: 36 ug/dL (ref 28–170)
Saturation Ratios: 16 % (ref 10.4–31.8)
TIBC: 226 ug/dL — ABNORMAL LOW (ref 250–450)
UIBC: 190 ug/dL

## 2021-10-27 LAB — VITAMIN B12: Vitamin B-12: 2895 pg/mL — ABNORMAL HIGH (ref 180–914)

## 2021-10-27 LAB — T4, FREE: Free T4: 1.37 ng/dL — ABNORMAL HIGH (ref 0.61–1.12)

## 2021-10-27 LAB — FERRITIN: Ferritin: 105 ng/mL (ref 11–307)

## 2021-10-27 MED ORDER — APIXABAN 2.5 MG PO TABS
2.5000 mg | ORAL_TABLET | Freq: Two times a day (BID) | ORAL | Status: DC
  Administered 2021-10-27 – 2021-10-28 (×3): 2.5 mg via ORAL
  Filled 2021-10-27 (×3): qty 1

## 2021-10-27 MED ORDER — AMLODIPINE BESYLATE 5 MG PO TABS: 5.0000 mg | ORAL_TABLET | Freq: Every day | ORAL | Status: DC

## 2021-10-27 MED ORDER — LACTATED RINGERS IV SOLN: INTRAVENOUS | Status: AC

## 2021-10-27 MED ORDER — ENSURE ENLIVE PO LIQD
237.0000 mL | Freq: Two times a day (BID) | ORAL | Status: DC
  Administered 2021-10-27 – 2021-10-28 (×3): 237 mL via ORAL

## 2021-10-27 MED ORDER — CARVEDILOL 3.125 MG PO TABS
3.1250 mg | ORAL_TABLET | Freq: Two times a day (BID) | ORAL | Status: DC
Start: 2021-10-27 — End: 2021-10-28
  Administered 2021-10-27 – 2021-10-28 (×2): 3.125 mg via ORAL
  Filled 2021-10-27 (×2): qty 1

## 2021-10-27 MED ORDER — ADULT MULTIVITAMIN W/MINERALS CH
1.0000 | ORAL_TABLET | Freq: Every day | ORAL | Status: DC
Start: 2021-10-27 — End: 2021-10-28
  Administered 2021-10-27 – 2021-10-28 (×2): 1 via ORAL
  Filled 2021-10-27 (×2): qty 1

## 2021-10-27 MED ORDER — AMLODIPINE BESYLATE 5 MG PO TABS
10.0000 mg | ORAL_TABLET | Freq: Every day | ORAL | Status: DC
  Administered 2021-10-27 – 2021-10-28 (×2): 10 mg via ORAL
  Filled 2021-10-27 (×2): qty 2

## 2021-10-27 NOTE — Progress Notes (Signed)
*  PRELIMINARY RESULTS* Echocardiogram 2D Echocardiogram has been performed.  Chelsea Meza 10/27/2021, 12:44 PM

## 2021-10-27 NOTE — Progress Notes (Addendum)
Initial Nutrition Assessment  DOCUMENTATION CODES:   Non-severe (moderate) malnutrition in context of chronic illness  INTERVENTION:  Ensure Enlive po BID, each supplement provides 350 kcal and 20 grams of protein.   Multivitamin daily  Education handouts attached to AVS summary. (Malnutrition and dehydration)  NUTRITION DIAGNOSIS:   Moderate Malnutrition related to chronic illness as evidenced by per patient/family report, severe fat depletion, moderate fat depletion, mild fat depletion, mild muscle depletion, moderate muscle depletion, severe muscle depletion.   GOAL:  Patient will meet greater than or equal to 90% of their needs  MONITOR:  PO intake, Supplement acceptance, Labs, Weight trends  REASON FOR ASSESSMENT:   Consult Assessment of nutrition requirement/status  ASSESSMENT: Patient is an 86 yo female with DM2, HTN, CHF, Anemia, CKD4 and TIA. She presents with altered mental status, weakness, AKI and a fall at home.   Patient ate- 75% of dinner last night. PO 100% of breakfast and lunch. Per chart review pt has not been eating the well the past 1-2 days PTA. No family bedside.   Patient daughter is a Marine scientist in [place] and they live together. She eats eggs at breakfast, frozen meals for lunch and daughter provides dinner. Patient favorite beverage pepsi or coke. Encouraged her to drink water or sugar free beverages vs pepsi or coke. Patient also likes nutrition shakes Boost /Ensure and drinks 1-2 daily (vanilla or chocolate). Emphasized the importance of adequate hydration, energy and  protein.  Patient has distant history of weight loss. June of 2020- wt. 86 kg (189 lb)  04/25/21- wt 54.4 kg 10/26/21-admission wt 54.4 kg appeared from previous encounter.  Re-weight obtained- 68.5 kg (150.9 lb) by nursing.   Intake/Output Summary (Last 24 hours) at 10/27/2021 1422 Last data filed at 10/27/2021 0500 Gross per 24 hour  Intake 720 ml  Output 200 ml  Net 520 ml     Medications: lipitor, insulin.   CBG (last 3)  Recent Labs    10/26/21 2138 10/27/21 0740 10/27/21 1107  GLUCAP 215* 87 170*    Labs reviewed:  BUN 55, Cr. 3.26, hgb 9.1   NUTRITION - FOCUSED PHYSICAL EXAM:  Flowsheet Row Most Recent Value  Orbital Region No depletion  Upper Arm Region Severe depletion  Thoracic and Lumbar Region Mild depletion  Buccal Region Moderate depletion  Temple Region Moderate depletion  Clavicle Bone Region Mild depletion  Clavicle and Acromion Bone Region Severe depletion  Scapular Bone Region Unable to assess  Dorsal Hand Moderate depletion  Patellar Region Mild depletion  Posterior Calf Region --  [scd's- calves not assessed]  Edema (RD Assessment) Moderate  [BLE which is new per patient]  Hair Unable to assess  [n/a]  Eyes Reviewed  Mouth Reviewed  Skin Reviewed  Nails Reviewed      Diet Order:   Diet Order             Diet heart healthy/carb modified Room service appropriate? Yes; Fluid consistency: Thin  Diet effective now                   EDUCATION NEEDS:  Education needs have been addressed  Skin:  Skin Assessment: Reviewed RN Assessment  Last BM:  PTA  Height:   Ht Readings from Last 1 Encounters:  10/26/21 '5\' 10"'$  (1.778 m)    Weight:   Wt Readings from Last 1 Encounters:  10/27/21 68.4 kg    Ideal Body Weight:   68 kg   BMI:  Body mass index  is 21.65 kg/m.  Estimated Nutritional Needs:   Kcal:  1600-1700  Protein:  55-60 gr  Fluid:  1600 ml daily   Colman Cater MS,RD,CSG,LDN Contact: Shea Evans

## 2021-10-27 NOTE — TOC Progression Note (Signed)
  Transition of Care (TOC) Screening Note   Patient Details  Name: Chelsea Meza Date of Birth: March 15, 1936   Transition of Care [facility]) CM/SW Contact:    Boneta Lucks, RN Phone Number: 10/27/2021, 10:22 AM    Transition of Care Department [facility]) has reviewed patient and no TOC needs have been identified at this time. We will continue to monitor patient advancement through interdisciplinary progression rounds. If new patient transition needs arise, please place a TOC consult.     Expected Discharge Plan: Home/Self Care Barriers to Discharge: Continued Medical Work up  Expected Discharge Plan and Services Expected Discharge Plan: Home/Self Care

## 2021-10-27 NOTE — Progress Notes (Signed)
Patient blood pressure has been elevated this shift,but did decrease after nighttime dose.  Patient did void this shift, on the [facility] with help from staff. Patient has been alert and oriented, with no complaints this shift.

## 2021-10-27 NOTE — Progress Notes (Signed)
PROGRESS NOTE    [name]  SWF:[id number] DOB: 30-Aug-1935 DOA: 10/26/2021 PCP: Iona Beard, MD   Brief Narrative:     Chelsea Meza is a 86 y.o. female with medical history significant for CKD stage IV, type 2 diabetes, dyslipidemia, and hypertension, who presented to the ED via her daughter on account of some generalized weakness and confusion.  Patient was admitted for AKI on CKD stage IV along with acute metabolic encephalopathy.  She is noted to have new onset atrial fibrillation and continues to have malignant hypertension requiring further blood pressure control.  Assessment & Plan:   Principal Problem:   AKI (acute kidney injury) ([place]) Active Problems:   DKA, type 2 ([place])   Essential hypertension   CKD (chronic kidney disease) stage 3, GFR 30-59 ml/min ([name])   Hyperlipidemia   Malnutrition of moderate degree  Assessment and Plan:  AKI on CKD IV-improving -Likely prerenal from dehydration -Continue IV hydration -Avoid nephrotoxic agents -Monitor strict I's and O's -Follow a.m. labs   Acute metabolic encephalopathy-resolved -Likely related to above  Malignant hypertension -Still requiring IV hydralazine pushes -Started on amlodipine 10 mg earlier today -Added Coreg 3.125 mg twice daily -Holding home Lasix   Atrial fibrillation -TSH elevated, but free T4 within normal limits -Check 2D echocardiogram pending -Monitor on telemetry -CHA2DS2-VASc score of 6 or greater and will start on anticoagulation with Eliquis -Discontinue aspirin   Possible yeast UTI -Given Diflucan x1 dose in ED -Continue Diflucan while admitted   Type 2 DM with mild hyperglycemia -SSI -A1c 6.7%   Dyslipidemia -Continue statin   Moderate malnutrition -Dietitian consultation  Hemorrhoids -No further bleeding noted today -Continue to monitor  Anemia -Continue monitor CBC -Anemia panel within normal limits   DVT prophylaxis: Eliquis Code Status: Full Family  Communication: Discussed with daughter on phone 6/29 Disposition Plan:  Status is: Inpatient Remains inpatient appropriate because: Continues to require IV fluids   Consultants:  None  Procedures:  None  Antimicrobials:  None   Subjective: Patient seen and evaluated today with no new acute complaints or concerns. No acute concerns or events noted overnight.  Objective: Vitals:   10/27/21 0111 10/27/21 0349 10/27/21 1400 10/27/21 1416  BP: (!) 156/85 (!) 196/115 (!) 187/106 (!) 187/106  Pulse: 78 72  88  Resp: '17 20  18  '$ Temp: 97.8 F (36.6 C) 99 F (37.2 C)  98.6 F (37 C)  TempSrc:      SpO2: 100% 100%  100%  Weight:   68.4 kg   Height:        Intake/Output Summary (Last 24 hours) at 10/27/2021 1418 Last data filed at 10/27/2021 0500 Gross per 24 hour  Intake 720 ml  Output 200 ml  Net 520 ml   Filed Weights   10/26/21 1008 10/27/21 1400  Weight: 54.4 kg 68.4 kg    Examination:  General exam: Appears calm and comfortable  Respiratory system: Clear to auscultation. Respiratory effort normal. Cardiovascular system: S1 & S2 heard, irregular rate.  Gastrointestinal system: Abdomen is soft Central nervous system: Alert and awake Extremities: No edema Skin: No significant lesions noted Psychiatry: Flat affect.    Data Reviewed: I have personally reviewed following labs and imaging studies  CBC: Recent Labs  Lab 10/26/21 1001 10/26/21 1037 10/27/21 0510  WBC 6.7  --  6.0  NEUTROABS 4.0  --   --   HGB 10.1* 10.5* 9.1*  HCT 31.4* 31.0* 28.8*  MCV 90.5  --  94.1  PLT 216  --  350   Basic Metabolic Panel: Recent Labs  Lab 10/26/21 1001 10/26/21 1037 10/27/21 0510  NA 140 141 140  K 3.8 3.9 3.7  CL 106 108 110  CO2 25  --  23  GLUCOSE 181* 172* 118*  BUN 59* 51* 55*  CREATININE 3.47* 3.80* 3.26*  CALCIUM 9.0  --  8.7*  MG  --   --  2.6*   GFR: Estimated Creatinine Clearance: 13.9 mL/min (A) (by C-G formula based on SCr of 3.26 mg/dL  (H)). Liver Function Tests: Recent Labs  Lab 10/26/21 1001  AST 18  ALT 15  ALKPHOS 45  BILITOT 0.8  PROT 6.6  ALBUMIN 3.4*   No results for input(s): "LIPASE", "AMYLASE" in the last 168 hours. No results for input(s): "AMMONIA" in the last 168 hours. Coagulation Profile: No results for input(s): "INR", "PROTIME" in the last 168 hours. Cardiac Enzymes: No results for input(s): "CKTOTAL", "CKMB", "CKMBINDEX", "TROPONINI" in the last 168 hours. BNP (last 3 results) No results for input(s): "PROBNP" in the last 8760 hours. HbA1C: Recent Labs    10/26/21 1600  HGBA1C 6.7*   CBG: Recent Labs  Lab 10/26/21 1733 10/26/21 2138 10/27/21 0740 10/27/21 1107  GLUCAP 169* 215* 87 170*   Lipid Profile: No results for input(s): "CHOL", "HDL", "LDLCALC", "TRIG", "CHOLHDL", "LDLDIRECT" in the last 72 hours. Thyroid Function Tests: Recent Labs    10/26/21 1600  TSH 5.384*   Anemia Panel: Recent Labs    10/26/21 1001 10/27/21 0510  VITAMINB12 2,895*  --   FOLATE 25.3  --   FERRITIN 105  --   TIBC 226*  --   IRON 36  --   RETICCTPCT  --  1.5   Sepsis Labs: No results for input(s): "PROCALCITON", "LATICACIDVEN" in the last 168 hours.  No results found for this or any previous visit (from the past 240 hour(s)).       Radiology Studies: DG Chest Portable 1 View  Result Date: 10/26/2021 CLINICAL DATA:  Weakness EXAM: PORTABLE CHEST 1 VIEW COMPARISON:  04/25/2021 FINDINGS: Cardiac enlargement unchanged. Normal vascularity. Lungs remain clear without infiltrate or effusion. IMPRESSION: No active disease. Electronically Signed   By: Franchot Gallo M.D.   On: 10/26/2021 11:56        Scheduled Meds:  amLODipine  10 mg Oral Daily   apixaban  2.5 mg Oral BID   atorvastatin  40 mg Oral QHS   carvedilol  3.125 mg Oral BID WC   fluconazole  100 mg Oral Daily   hydrALAZINE  100 mg Oral Q8H   insulin aspart  0-5 Units Subcutaneous QHS   insulin aspart  0-9 Units  Subcutaneous TID WC   insulin glargine-yfgn  15 Units Subcutaneous QHS   Continuous Infusions:  lactated ringers       LOS: 1 day    Time spent: 35 minutes    Makiah Clauson Darleen Crocker, DO [facility]  If 7PM-7AM, please contact night-coverage [email or web address] 10/27/2021, 2:18 PM

## 2021-10-28 DIAGNOSIS — N179 Acute kidney failure, unspecified: Secondary | ICD-10-CM | POA: Diagnosis not present

## 2021-10-28 LAB — CBC
HCT: 28.4 % — ABNORMAL LOW (ref 36.0–46.0)
Hemoglobin: 8.7 g/dL — ABNORMAL LOW (ref 12.0–15.0)
MCH: 28.6 pg (ref 26.0–34.0)
MCHC: 30.6 g/dL (ref 30.0–36.0)
MCV: 93.4 fL (ref 80.0–100.0)
Platelets: 212 10*3/uL (ref 150–400)
RBC: 3.04 MIL/uL — ABNORMAL LOW (ref 3.87–5.11)
RDW: 14.1 % (ref 11.5–15.5)
WBC: 7.4 10*3/uL (ref 4.0–10.5)
nRBC: 0 % (ref 0.0–0.2)

## 2021-10-28 LAB — GLUCOSE, CAPILLARY
Glucose-Capillary: 37 mg/dL — CL (ref 70–99)
Glucose-Capillary: 42 mg/dL — CL (ref 70–99)
Glucose-Capillary: 93 mg/dL (ref 70–99)
Glucose-Capillary: 165 mg/dL — ABNORMAL HIGH (ref 70–99)
Glucose-Capillary: 136 mg/dL — ABNORMAL HIGH (ref 70–99)

## 2021-10-28 LAB — BASIC METABOLIC PANEL
Anion gap: 6 (ref 5–15)
BUN: 57 mg/dL — ABNORMAL HIGH (ref 8–23)
CO2: 24 mmol/L (ref 22–32)
Calcium: 8.8 mg/dL — ABNORMAL LOW (ref 8.9–10.3)
Chloride: 109 mmol/L (ref 98–111)
Creatinine, Ser: 3.21 mg/dL — ABNORMAL HIGH (ref 0.44–1.00)
GFR, Estimated: 14 mL/min — ABNORMAL LOW (ref >60)
Glucose, Bld: 53 mg/dL — ABNORMAL LOW (ref 70–99)
Potassium: 4 mmol/L (ref 3.5–5.1)
Sodium: 139 mmol/L (ref 135–145)

## 2021-10-28 LAB — URINE CULTURE

## 2021-10-28 LAB — MAGNESIUM: Magnesium: 2.4 mg/dL (ref 1.7–2.4)

## 2021-10-28 MED ORDER — ADULT MULTIVITAMIN W/MINERALS CH
1.0000 | ORAL_TABLET | Freq: Every day | ORAL | 0 refills | Status: AC
Start: 2021-10-28 — End: 2021-11-27

## 2021-10-28 MED ORDER — AMLODIPINE BESYLATE 10 MG PO TABS
10.0000 mg | ORAL_TABLET | Freq: Every day | ORAL | 1 refills | Status: DC
Start: 2021-10-28 — End: 2022-01-08

## 2021-10-28 MED ORDER — ENSURE ENLIVE PO LIQD: 237.0000 mL | Freq: Two times a day (BID) | ORAL | 12 refills | Status: DC

## 2021-10-28 MED ORDER — FLUCONAZOLE 100 MG PO TABS: 100.0000 mg | ORAL_TABLET | Freq: Every day | ORAL | 0 refills | Status: AC

## 2021-10-28 MED ORDER — CARVEDILOL 3.125 MG PO TABS: 3.1250 mg | ORAL_TABLET | Freq: Two times a day (BID) | ORAL | 1 refills | Status: DC

## 2021-10-28 MED ORDER — APIXABAN 2.5 MG PO TABS: 2.5000 mg | ORAL_TABLET | Freq: Two times a day (BID) | ORAL | 0 refills | Status: DC

## 2021-10-28 MED ORDER — DEXTROSE 50 % IV SOLN
INTRAVENOUS | Status: AC
  Filled 2021-10-28: qty 50

## 2021-10-28 NOTE — Discharge Summary (Signed)
Physician Discharge Summary  Chelsea Meza OZH:[id number] DOB: 04/17/1936 DOA: 10/26/2021  PCP: Iona Beard, MD  Admit date: 10/26/2021  Discharge date: 10/28/2021  Admitted From:Home  Disposition:  Home  Recommendations for Outpatient Follow-up:  Follow up with PCP in 1-2 weeks Follow-up BMP in 1 week to ensure that creatinine levels remained stable while resuming home Lasix Follow-up with cardiology outpatient with referral sent regarding new findings of atrial fibrillation with initiation of Eliquis as well as Coreg Follow-up with endocrinology with referral sent given findings of elevated TSH and free T4, for further evaluation and work-up Continue amlodipine and Coreg for improved blood pressure control and resume home Lasix Continue Eliquis 2.5 mg twice daily for anticoagulation given atrial fibrillation  Home Health: None  Equipment/Devices: None  Discharge Condition:Stable  CODE STATUS: Full  Diet recommendation: Heart Healthy/carb modified  Brief/Interim Summary:  Chelsea Meza is a 86 y.o. female with medical history significant for CKD stage IV, type 2 diabetes, dyslipidemia, and hypertension, who presented to the ED via her daughter on account of some generalized weakness and confusion.  Patient was admitted for AKI on CKD stage IV along with acute metabolic encephalopathy.  She is noted to have new onset atrial fibrillation and some malignant hypertension that required better blood pressure control with initiation of Coreg and amlodipine.  She will resume Lasix at home as her creatinine levels appear to have stabilized and improved at approximately 3.2.  She may have some progression of her CKD as well.  She has been started on Eliquis twice daily per cardiology recommendations and they will follow-up outpatient.  No other acute events noted throughout the course of the stay and she is stable for discharge.  Discharge Diagnoses:  Principal Problem:   AKI (acute  kidney injury) ([employer]) Active Problems:   DKA, type 2 ([place])   Essential hypertension   CKD (chronic kidney disease) stage 3, GFR 30-59 ml/min ([name])   Hyperlipidemia   Malnutrition of moderate degree  Principal discharge diagnosis: Acute metabolic encephalopathy secondary to AKI on CKD stage IV.  New onset atrial fibrillation.  Malignant hypertension.  Discharge Instructions  Discharge Instructions     Ambulatory referral to Cardiology   Complete by: As directed    Ambulatory referral to Endocrinology   Complete by: As directed    Diet - low sodium heart healthy   Complete by: As directed    Increase activity slowly   Complete by: As directed       Allergies as of 10/28/2021   No Known Allergies      Medication List     STOP taking these medications    aspirin EC 81 MG tablet       TAKE these medications    Accu-Chek FastClix Lancets Misc   Accu-Chek Guide test strip Generic drug: glucose blood USE TO TEST TWICE DAILY.AL   Accu-Chek Guide w/Device Kit USE AS DIRECTED.I   amLODipine 10 MG tablet Commonly known as: NORVASC Take 1 tablet (10 mg total) by mouth daily.   apixaban 2.5 MG Tabs tablet Commonly known as: ELIQUIS Take 1 tablet (2.5 mg total) by mouth 2 (two) times daily.   atorvastatin 40 MG tablet Commonly known as: LIPITOR Take 40 mg by mouth at bedtime. What changed: Another medication with the same name was removed. Continue taking this medication, and follow the directions you see here.   carvedilol 3.125 MG tablet Commonly known as: COREG Take 1 tablet (3.125 mg total) by mouth 2 (  two) times daily with a meal.   feeding supplement Liqd Take 237 mLs by mouth 2 (two) times daily between meals.   fluconazole 100 MG tablet Commonly known as: DIFLUCAN Take 1 tablet (100 mg total) by mouth daily for 5 days.   furosemide 40 MG tablet Commonly known as: LASIX Take 1 tablet (40 mg total) by mouth daily.   Gvoke PFS 0.5 MG/0.1ML  Sosy Generic drug: Glucagon Inject 0.5 mg into the skin as needed (low bs).   hydrALAZINE 100 MG tablet Commonly known as: APRESOLINE Take 1 tablet (100 mg total) by mouth 3 (three) times daily. What changed: when to take this   insulin lispro 100 UNIT/ML KwikPen Commonly known as: HUMALOG Inject 6 Units into the skin in the morning, at noon, and at bedtime.   Lantus SoloStar 100 UNIT/ML Solostar Pen Generic drug: insulin glargine INJECT 25 UNITS INTO THE SKIN DAILY. What changed:  how much to take how to take this when to take this   lidocaine 5 % Commonly known as: Lidoderm Place 1 patch onto the skin daily. Remove & Discard patch within 12 hours or as directed by MD   multivitamin with minerals Tabs tablet Take 1 tablet by mouth daily.   Pen Needles 32G X 4 MM Misc Use as directed   VITAMIN B-12 CR PO Take 1 tablet by mouth daily.        Follow-up Information     Iona Beard, MD. Schedule an appointment as soon as possible for a visit in 1 week(s).   Specialty: Family Medicine Contact information: [address] [place] [address] [phone]         Arnoldo Lenis, MD. Go to.   Specialty: Cardiology Contact information: [address] [place] [address] [phone]         [facility]. Go to.   Specialty: Endocrinology Contact information: [address] [place] [phone]               No Known Allergies  Consultations: None   Procedures/Studies: ECHOCARDIOGRAM COMPLETE  Result Date: 10/27/2021    ECHOCARDIOGRAM REPORT   Patient Name:   Chelsea Meza Date of Exam: 10/27/2021 Medical Rec #:  [id number]         Height:       70.0 in Accession #:    [id number]        Weight:       120.0 lb Date of Birth:  06/12/1935         BSA:          1.680 m Patient Age:    46 years          BP:           196/115 mmHg Patient Gender: F                 HR:            77 bpm. Exam Location:  [facility] Procedure: 2D Echo, Cardiac Doppler and Color Doppler Indications:    Atrial fibrillation  History:        Patient has prior history of Echocardiogram examinations, most                 recent 04/05/2020. CHF, Previous Myocardial Infarction, TIA,                 Arrythmias:Atrial Fibrillation and Tachycardia; Risk  Factors:Hypertension, Diabetes and Dyslipidemia.  Sonographer:    Wenda Low Referring Phys: [id number] [place] D Laie  1. Echocardiographic features concerning for infiltrative disease such as amyloidosis. Left ventricular ejection fraction, by estimation, is 60 to 65%. The left ventricle has normal function. The left ventricle has no regional wall motion abnormalities.  There is severe left ventricular hypertrophy. Left ventricular diastolic function could not be evaluated.  2. Right ventricular systolic function is normal. The right ventricular size is normal. Moderately increased right ventricular wall thickness. There is mildly elevated pulmonary artery systolic pressure. The estimated right ventricular systolic pressure  is 51.7 mmHg.  3. Left atrial size was moderately dilated.  4. A small pericardial effusion is present. The pericardial effusion is localized near the right atrium. There is no evidence of cardiac tamponade.  5. The mitral valve is abnormal. Trivial mitral valve regurgitation.  6. The aortic valve is tricuspid. Aortic valve regurgitation is trivial. Aortic valve sclerosis is present, with no evidence of aortic valve stenosis.  7. Aortic dilatation noted. There is mild dilatation of the aortic root, measuring 40 mm.  8. The inferior vena cava is normal in size with <50% respiratory variability, suggesting right atrial pressure of 8 mmHg. Comparison(s): Changes from prior study are noted. 04/05/2020: LVEF 65-70%, severe LVH, pericardial effusion. Conclusion(s)/Recommendation(s): Would consider evaluation for possible  cardiac amyloidosis with PYP scan and/or cMRI. FINDINGS  Left Ventricle: Echocardiographic features concerning for infiltrative disease such as amyloidosis. Left ventricular ejection fraction, by estimation, is 60 to 65%. The left ventricle has normal function. The left ventricle has no regional wall motion abnormalities. The left ventricular internal cavity size was normal in size. There is severe left ventricular hypertrophy. Left ventricular diastolic function could not be evaluated due to atrial fibrillation. Left ventricular diastolic function could not be evaluated. Right Ventricle: The right ventricular size is normal. Moderately increased right ventricular wall thickness. Right ventricular systolic function is normal. There is mildly elevated pulmonary artery systolic pressure. The tricuspid regurgitant velocity is 2.70 m/s, and with an assumed right atrial pressure of 8 mmHg, the estimated right ventricular systolic pressure is 61.6 mmHg. Left Atrium: Left atrial size was moderately dilated. Right Atrium: Right atrial size was normal in size. Pericardium: A small pericardial effusion is present. The pericardial effusion is localized near the right atrium. There is no evidence of cardiac tamponade. Mitral Valve: The mitral valve is abnormal. There is mild thickening of the anterior and posterior mitral valve leaflet(s). Trivial mitral valve regurgitation. MV peak gradient, 6.6 mmHg. The mean mitral valve gradient is 3.0 mmHg. Tricuspid Valve: The tricuspid valve is grossly normal. Tricuspid valve regurgitation is trivial. Aortic Valve: The aortic valve is tricuspid. Aortic valve regurgitation is trivial. Aortic valve sclerosis is present, with no evidence of aortic valve stenosis. Aortic valve mean gradient measures 5.0 mmHg. Aortic valve peak gradient measures 8.4 mmHg. Aortic valve area, by VTI measures 1.98 cm. Pulmonic Valve: The pulmonic valve was normal in structure. Pulmonic valve regurgitation is  not visualized. Aorta: Aortic dilatation noted. There is mild dilatation of the aortic root, measuring 40 mm. Venous: The inferior vena cava is normal in size with less than 50% respiratory variability, suggesting right atrial pressure of 8 mmHg. IAS/Shunts: No atrial level shunt detected by color flow Doppler.  LEFT VENTRICLE PLAX 2D LVIDd:         4.70 cm     Diastology LVIDs:         3.40 cm  LV e' medial:    4.90 cm/s LV PW:         1.80 cm     LV E/e' medial:  18.5 LV IVS:        1.70 cm     LV e' lateral:   6.53 cm/s LVOT diam:     2.00 cm     LV E/e' lateral: 13.9 LV SV:         63 LV SV Index:   37 LVOT Area:     3.14 cm  LV Volumes (MOD) LV vol d, MOD A2C: 52.9 ml LV vol d, MOD A4C: 78.5 ml LV vol s, MOD A2C: 22.9 ml LV vol s, MOD A4C: 32.2 ml LV SV MOD A2C:     30.0 ml LV SV MOD A4C:     78.5 ml LV SV MOD BP:      40.7 ml RIGHT VENTRICLE RV Basal diam:  2.70 cm RV Mid diam:    2.60 cm RV S prime:     10.30 cm/s TAPSE (M-mode): 3.4 cm LEFT ATRIUM              Index        RIGHT ATRIUM           Index LA diam:        4.70 cm  2.80 cm/m   RA Area:     18.90 cm LA Vol (A2C):   121.0 ml 72.02 ml/m  RA Volume:   50.40 ml  30.00 ml/m LA Vol (A4C):   78.4 ml  46.67 ml/m LA Biplane Vol: 97.4 ml  57.98 ml/m  AORTIC VALVE                    PULMONIC VALVE AV Area (Vmax):    2.06 cm     PV Vmax:       0.77 m/s AV Area (Vmean):   1.85 cm     PV Peak grad:  2.4 mmHg AV Area (VTI):     1.98 cm AV Vmax:           145.00 cm/s AV Vmean:          99.000 cm/s AV VTI:            0.317 m AV Peak Grad:      8.4 mmHg AV Mean Grad:      5.0 mmHg LVOT Vmax:         95.30 cm/s LVOT Vmean:        58.300 cm/s LVOT VTI:          0.200 m LVOT/AV VTI ratio: 0.63  AORTA Ao Root diam: 4.00 cm Ao Asc diam:  4.00 cm MITRAL VALVE                TRICUSPID VALVE MV Area (PHT): 5.93 cm     TR Peak grad:   29.2 mmHg MV Area VTI:   2.33 cm     TR Vmax:        270.00 cm/s MV Peak grad:  6.6 mmHg MV Mean grad:  3.0 mmHg     SHUNTS MV  Vmax:       1.28 m/s     Systemic VTI:  0.20 m MV Vmean:      76.6 cm/s    Systemic Diam: 2.00 cm MV Decel Time: 128 msec MV E velocity: 90.80 cm/s MV A velocity: 110.00 cm/s MV E/A ratio:  0.83 [employer]  MD Electronically signed by Lyman Bishop MD Signature Date/Time: 10/27/2021/5:30:06 PM    Final    DG Chest Portable 1 View  Result Date: 10/26/2021 CLINICAL DATA:  Weakness EXAM: PORTABLE CHEST 1 VIEW COMPARISON:  04/25/2021 FINDINGS: Cardiac enlargement unchanged. Normal vascularity. Lungs remain clear without infiltrate or effusion. IMPRESSION: No active disease. Electronically Signed   By: Franchot Gallo M.D.   On: 10/26/2021 11:56     Discharge Exam: Vitals:   10/27/21 2056 10/28/21 0401  BP: (!) 170/88 (!) 148/88  Pulse: 68 71  Resp: 20 14  Temp: 98.3 F (36.8 C) 98.1 F (36.7 C)  SpO2: 100% 100%   Vitals:   10/27/21 1416 10/27/21 1519 10/27/21 2056 10/28/21 0401  BP: (!) 187/106 (!) 156/89 (!) 170/88 (!) 148/88  Pulse: 88 81 68 71  Resp: '18  20 14  ' Temp: 98.6 F (37 C)  98.3 F (36.8 C) 98.1 F (36.7 C)  TempSrc:      SpO2: 100%  100% 100%  Weight:      Height:        General: Pt is alert, awake, not in acute distress Cardiovascular: RRR, S1/S2 +, no rubs, no gallops Respiratory: CTA bilaterally, no wheezing, no rhonchi Abdominal: Soft, NT, ND, bowel sounds + Extremities: no edema, no cyanosis    The results of significant diagnostics from this hospitalization (including imaging, microbiology, ancillary and laboratory) are listed below for reference.     Microbiology: No results found for this or any previous visit (from the past 240 hour(s)).   Labs: BNP (last 3 results) No results for input(s): "BNP" in the last 8760 hours. Basic Metabolic Panel: Recent Labs  Lab 10/26/21 1001 10/26/21 1037 10/27/21 0510 10/28/21 0511  NA 140 141 140 139  K 3.8 3.9 3.7 4.0  CL 106 108 110 109  CO2 25  --  23 24  GLUCOSE 181* 172* 118* 53*  BUN 59* 51* 55*  57*  CREATININE 3.47* 3.80* 3.26* 3.21*  CALCIUM 9.0  --  8.7* 8.8*  MG  --   --  2.6* 2.4   Liver Function Tests: Recent Labs  Lab 10/26/21 1001  AST 18  ALT 15  ALKPHOS 45  BILITOT 0.8  PROT 6.6  ALBUMIN 3.4*   No results for input(s): "LIPASE", "AMYLASE" in the last 168 hours. No results for input(s): "AMMONIA" in the last 168 hours. CBC: Recent Labs  Lab 10/26/21 1001 10/26/21 1037 10/27/21 0510 10/28/21 0511  WBC 6.7  --  6.0 7.4  NEUTROABS 4.0  --   --   --   HGB 10.1* 10.5* 9.1* 8.7*  HCT 31.4* 31.0* 28.8* 28.4*  MCV 90.5  --  94.1 93.4  PLT 216  --  197 212   Cardiac Enzymes: No results for input(s): "CKTOTAL", "CKMB", "CKMBINDEX", "TROPONINI" in the last 168 hours. BNP: Invalid input(s): "POCBNP" CBG: Recent Labs  Lab 10/27/21 1611 10/27/21 2054 10/28/21 0703 10/28/21 0720 10/28/21 0746  GLUCAP 221* 177* 37* 42* 93   D-Dimer No results for input(s): "DDIMER" in the last 72 hours. Hgb A1c Recent Labs    10/26/21 1600  HGBA1C 6.7*   Lipid Profile No results for input(s): "CHOL", "HDL", "LDLCALC", "TRIG", "CHOLHDL", "LDLDIRECT" in the last 72 hours. Thyroid function studies Recent Labs    10/26/21 1600  TSH 5.384*   Anemia work up Recent Labs    10/26/21 1001 10/27/21 0510  VITAMINB12 2,895*  --   FOLATE 25.3  --  FERRITIN 105  --   TIBC 226*  --   IRON 36  --   RETICCTPCT  --  1.5   Urinalysis    Component Value Date/Time   COLORURINE YELLOW 10/26/2021 1005   APPEARANCEUR CLOUDY (A) 10/26/2021 1005   LABSPEC 1.012 10/26/2021 1005   PHURINE 5.0 10/26/2021 1005   GLUCOSEU 50 (A) 10/26/2021 1005   HGBUR NEGATIVE 10/26/2021 1005   BILIRUBINUR NEGATIVE 10/26/2021 1005   KETONESUR NEGATIVE 10/26/2021 1005   PROTEINUR >=300 (A) 10/26/2021 1005   UROBILINOGEN 0.2 07/13/2012 1236   NITRITE NEGATIVE 10/26/2021 1005   LEUKOCYTESUR LARGE (A) 10/26/2021 1005   Sepsis Labs Recent Labs  Lab 10/26/21 1001 10/27/21 0510  10/28/21 0511  WBC 6.7 6.0 7.4   Microbiology No results found for this or any previous visit (from the past 240 hour(s)).   Time coordinating discharge: 35 minutes  SIGNED:   Rodena Goldmann, DO [facility] 10/28/2021, 9:19 AM  If 7PM-7AM, please contact night-coverage [email or web address]

## 2021-11-10 ENCOUNTER — Emergency Department (HOSPITAL_COMMUNITY): Payer: Medicare Other

## 2021-11-10 ENCOUNTER — Emergency Department (HOSPITAL_COMMUNITY)
Admission: EM | Admit: 2021-11-10 | Discharge: 2021-11-11 | Disposition: A | Discharge status: Home / Self Care | Payer: Medicare Other | Attending: Emergency Medicine | Admitting: Emergency Medicine

## 2021-11-10 ENCOUNTER — Encounter (HOSPITAL_COMMUNITY): Payer: Self-pay

## 2021-11-10 ENCOUNTER — Other Ambulatory Visit: Payer: Self-pay

## 2021-11-10 DIAGNOSIS — R2689 Other abnormalities of gait and mobility: Secondary | ICD-10-CM | POA: Insufficient documentation

## 2021-11-10 DIAGNOSIS — Z79899 Other long term (current) drug therapy: Secondary | ICD-10-CM | POA: Insufficient documentation

## 2021-11-10 DIAGNOSIS — Z794 Long term (current) use of insulin: Secondary | ICD-10-CM | POA: Diagnosis not present

## 2021-11-10 DIAGNOSIS — I130 Hypertensive heart and chronic kidney disease with heart failure and stage 1 through stage 4 chronic kidney disease, or unspecified chronic kidney disease: Secondary | ICD-10-CM | POA: Diagnosis not present

## 2021-11-10 DIAGNOSIS — M6281 Muscle weakness (generalized): Secondary | ICD-10-CM | POA: Diagnosis not present

## 2021-11-10 DIAGNOSIS — I509 Heart failure, unspecified: Secondary | ICD-10-CM | POA: Insufficient documentation

## 2021-11-10 DIAGNOSIS — R079 Chest pain, unspecified: Secondary | ICD-10-CM | POA: Diagnosis not present

## 2021-11-10 DIAGNOSIS — Z7901 Long term (current) use of anticoagulants: Secondary | ICD-10-CM | POA: Diagnosis not present

## 2021-11-10 DIAGNOSIS — R1032 Left lower quadrant pain: Secondary | ICD-10-CM | POA: Diagnosis present

## 2021-11-10 DIAGNOSIS — K5641 Fecal impaction: Secondary | ICD-10-CM | POA: Insufficient documentation

## 2021-11-10 DIAGNOSIS — R6889 Other general symptoms and signs: Secondary | ICD-10-CM | POA: Diagnosis not present

## 2021-11-10 DIAGNOSIS — R109 Unspecified abdominal pain: Secondary | ICD-10-CM | POA: Diagnosis not present

## 2021-11-10 DIAGNOSIS — J90 Pleural effusion, not elsewhere classified: Secondary | ICD-10-CM | POA: Diagnosis not present

## 2021-11-10 DIAGNOSIS — E1122 Type 2 diabetes mellitus with diabetic chronic kidney disease: Secondary | ICD-10-CM | POA: Insufficient documentation

## 2021-11-10 DIAGNOSIS — R2681 Unsteadiness on feet: Secondary | ICD-10-CM | POA: Insufficient documentation

## 2021-11-10 DIAGNOSIS — N189 Chronic kidney disease, unspecified: Secondary | ICD-10-CM | POA: Insufficient documentation

## 2021-11-10 DIAGNOSIS — Z743 Need for continuous supervision: Secondary | ICD-10-CM | POA: Diagnosis not present

## 2021-11-10 LAB — CBC WITH DIFFERENTIAL/PLATELET
Abs Immature Granulocytes: 0.02 10*3/uL (ref 0.00–0.07)
Basophils Absolute: 0.1 10*3/uL (ref 0.0–0.1)
Basophils Relative: 1 %
Eosinophils Absolute: 0.1 10*3/uL (ref 0.0–0.5)
Eosinophils Relative: 2 %
HCT: 31.6 % — ABNORMAL LOW (ref 36.0–46.0)
Hemoglobin: 10.3 g/dL — ABNORMAL LOW (ref 12.0–15.0)
Immature Granulocytes: 0 %
Lymphocytes Relative: 29 %
Lymphs Abs: 1.8 10*3/uL (ref 0.7–4.0)
MCH: 29.9 pg (ref 26.0–34.0)
MCHC: 32.6 g/dL (ref 30.0–36.0)
MCV: 91.6 fL (ref 80.0–100.0)
Monocytes Absolute: 0.3 10*3/uL (ref 0.1–1.0)
Monocytes Relative: 4 %
Neutro Abs: 3.9 10*3/uL (ref 1.7–7.7)
Neutrophils Relative %: 64 %
Platelets: 260 10*3/uL (ref 150–400)
RBC: 3.45 MIL/uL — ABNORMAL LOW (ref 3.87–5.11)
RDW: 13.7 % (ref 11.5–15.5)
WBC: 6 10*3/uL (ref 4.0–10.5)
nRBC: 0 % (ref 0.0–0.2)

## 2021-11-10 LAB — COMPREHENSIVE METABOLIC PANEL
ALT: 16 U/L (ref 0–44)
AST: 21 U/L (ref 15–41)
Albumin: 3.3 g/dL — ABNORMAL LOW (ref 3.5–5.0)
Alkaline Phosphatase: 45 U/L (ref 38–126)
Anion gap: 6 (ref 5–15)
BUN: 59 mg/dL — ABNORMAL HIGH (ref 8–23)
CO2: 27 mmol/L (ref 22–32)
Calcium: 8.9 mg/dL (ref 8.9–10.3)
Chloride: 106 mmol/L (ref 98–111)
Creatinine, Ser: 3.07 mg/dL — ABNORMAL HIGH (ref 0.44–1.00)
GFR, Estimated: 14 mL/min — ABNORMAL LOW (ref >60)
Glucose, Bld: 191 mg/dL — ABNORMAL HIGH (ref 70–99)
Potassium: 4.5 mmol/L (ref 3.5–5.1)
Sodium: 139 mmol/L (ref 135–145)
Total Bilirubin: 0.6 mg/dL (ref 0.3–1.2)
Total Protein: 6.5 g/dL (ref 6.5–8.1)

## 2021-11-10 LAB — LIPASE, BLOOD: Lipase: 33 U/L (ref 11–51)

## 2021-11-10 MED ORDER — ATORVASTATIN CALCIUM 40 MG PO TABS
40.0000 mg | ORAL_TABLET | Freq: Every day | ORAL | Status: DC
  Administered 2021-11-11: 40 mg via ORAL
  Filled 2021-11-10: qty 1

## 2021-11-10 MED ORDER — APIXABAN 2.5 MG PO TABS
2.5000 mg | ORAL_TABLET | Freq: Two times a day (BID) | ORAL | Status: DC
  Administered 2021-11-10 – 2021-11-11 (×2): 2.5 mg via ORAL
  Filled 2021-11-10 (×2): qty 1

## 2021-11-10 MED ORDER — FLEET ENEMA 7-19 GM/118ML RE ENEM
1.0000 | ENEMA | Freq: Once | RECTAL | Status: AC
  Administered 2021-11-10: 1 via RECTAL

## 2021-11-10 MED ORDER — AMLODIPINE BESYLATE 5 MG PO TABS
10.0000 mg | ORAL_TABLET | Freq: Once | ORAL | Status: AC
  Administered 2021-11-11: 10 mg via ORAL
  Filled 2021-11-10: qty 2

## 2021-11-10 MED ORDER — ENSURE ENLIVE PO LIQD
237.0000 mL | Freq: Two times a day (BID) | ORAL | Status: DC
  Filled 2021-11-10 (×4): qty 237

## 2021-11-10 MED ORDER — INSULIN GLARGINE-YFGN 100 UNIT/ML ~~LOC~~ SOLN
25.0000 [IU] | Freq: Every day | SUBCUTANEOUS | Status: DC
  Administered 2021-11-11: 25 [IU] via SUBCUTANEOUS
  Filled 2021-11-10 (×2): qty 0.25

## 2021-11-10 MED ORDER — HYDRALAZINE HCL 25 MG PO TABS
100.0000 mg | ORAL_TABLET | Freq: Two times a day (BID) | ORAL | Status: DC
  Administered 2021-11-10 – 2021-11-11 (×2): 100 mg via ORAL
  Filled 2021-11-10 (×2): qty 4

## 2021-11-10 MED ORDER — CARVEDILOL 3.125 MG PO TABS
3.1250 mg | ORAL_TABLET | Freq: Two times a day (BID) | ORAL | Status: DC
  Administered 2021-11-11 (×2): 3.125 mg via ORAL
  Filled 2021-11-10 (×2): qty 1

## 2021-11-10 MED ORDER — FUROSEMIDE 40 MG PO TABS
40.0000 mg | ORAL_TABLET | Freq: Once | ORAL | Status: AC
  Administered 2021-11-11: 40 mg via ORAL
  Filled 2021-11-10: qty 1

## 2021-11-10 NOTE — ED Triage Notes (Signed)
Pt presents to ED via [facility] for mid abdominal pain since yesterday. CBG 233. Pt reports mid abdominal pain and constipation x 2-3 days.

## 2021-11-10 NOTE — ED Provider Notes (Signed)
[facility] EMERGENCY DEPARTMENT Provider Note   CSN: [id number] Arrival date & time: 11/10/21  1330     History  Chief Complaint  Patient presents with   Abdominal Pain    Chelsea Meza is a 86 y.o. female with history of diabetes type 2, hypertension, CKD, CHF who presents to the ED for evaluation of lower abdominal pain extending from umbilical to left lower quadrant.  Patient states pain got worse starting yesterday.  She states that she has not had a bowel movement in 3 weeks.  She often needs to use digital insertion to break up some of the stool to have small amounts of bowel movement.  She states that she has been taking over-the-counter laxatives without improvement in symptoms.  She denies dysuria, hematuria nausea, vomiting, chest pain, shortness of breath, fevers and chills.   Abdominal Pain Associated symptoms: constipation   Associated symptoms: no chest pain, no diarrhea, no fever, no nausea, no shortness of breath and no vomiting        Home Medications Prior to Admission medications   Medication Sig Start Date End Date Taking? Authorizing Provider  Accu-Chek FastClix Lancets MISC  04/09/20   [provider]  ACCU-CHEK GUIDE test strip USE TO TEST TWICE DAILY.AL 07/05/20   [provider]  amLODipine (NORVASC) 10 MG tablet Take 1 tablet (10 mg total) by mouth daily. 10/28/21 12/27/21  Manuella Ghazi, Pratik D, DO  apixaban (ELIQUIS) 2.5 MG TABS tablet Take 1 tablet (2.5 mg total) by mouth 2 (two) times daily. 10/28/21 01/26/22  Manuella Ghazi, Pratik D, DO  atorvastatin (LIPITOR) 40 MG tablet Take 40 mg by mouth at bedtime. 10/19/21   [provider]  Blood Glucose Monitoring Suppl (ACCU-CHEK GUIDE) w/Device KIT USE AS DIRECTED.I 09/03/20   [provider]  carvedilol (COREG) 3.125 MG tablet Take 1 tablet (3.125 mg total) by mouth 2 (two) times daily with a meal. 10/28/21 12/27/21  Manuella Ghazi, Pratik D, DO  Cyanocobalamin (VITAMIN B-12 CR PO) Take 1 tablet by  mouth daily.    [provider]  feeding supplement (ENSURE ENLIVE / ENSURE PLUS) LIQD Take 237 mLs by mouth 2 (two) times daily between meals. 10/28/21   Manuella Ghazi, Pratik D, DO  furosemide (LASIX) 40 MG tablet Take 1 tablet (40 mg total) by mouth daily. 02/28/21   Johnson, Clanford L, MD  GVOKE PFS 0.5 MG/0.1ML SOSY Inject 0.5 mg into the skin as needed (low bs). 02/08/21   [provider]  hydrALAZINE (APRESOLINE) 100 MG tablet Take 1 tablet (100 mg total) by mouth 3 (three) times daily. Patient taking differently: Take 100 mg by mouth 2 (two) times daily. 10/22/18   Orson Eva, MD  insulin glargine (LANTUS) 100 UNIT/ML Solostar Pen INJECT 25 UNITS INTO THE SKIN DAILY. Patient taking differently: Inject 25 Units into the skin daily. 06/23/20 10/26/21  Ghimire, Henreitta Leber, MD  insulin lispro (HUMALOG) 100 UNIT/ML KwikPen Inject 6 Units into the skin in the morning, at noon, and at bedtime. 08/13/21   [provider]  Insulin Pen Needle (PEN NEEDLES) 32G X 4 MM MISC Use as directed 06/23/20   Ghimire, Henreitta Leber, MD  lidocaine (LIDODERM) 5 % Place 1 patch onto the skin daily. Remove & Discard patch within 12 hours or as directed by MD 04/16/21   Evalee Jefferson, PA-C  Multiple Vitamin (MULTIVITAMIN WITH MINERALS) TABS tablet Take 1 tablet by mouth daily. 10/28/21 11/27/21  Heath Lark D, DO      Allergies  Patient has no known allergies.    Review of Systems   Review of Systems  Constitutional:  Negative for fever.  Respiratory:  Negative for shortness of breath.   Cardiovascular:  Negative for chest pain.  Gastrointestinal:  Positive for abdominal pain and constipation. Negative for diarrhea, nausea and vomiting.    Physical Exam Updated Vital Signs BP (!) 197/96   Pulse 71   Temp 97.6 F (36.4 C) (Oral)   Resp 16   Ht '5\' 9"'  (1.753 m)   Wt 63.5 kg   SpO2 100%   BMI 20.67 kg/m  Physical Exam Vitals and nursing note reviewed.  Constitutional:      General: She is  not in acute distress.    Appearance: She is not ill-appearing.     Comments: Chronically ill-appearing  HENT:     Head: Atraumatic.  Eyes:     Conjunctiva/sclera: Conjunctivae normal.  Cardiovascular:     Rate and Rhythm: Normal rate and regular rhythm.     Pulses: Normal pulses.     Heart sounds: No murmur heard. Pulmonary:     Effort: Pulmonary effort is normal. No respiratory distress.     Breath sounds: Normal breath sounds.  Abdominal:     General: Abdomen is flat. There is no distension.     Palpations: Abdomen is soft.     Tenderness: There is no abdominal tenderness.     Comments: Abdomen is soft, nondistended.  She is mildly tender to palpation at suprapubic region and into the left lower quadrant  Genitourinary:    Comments: Impacted stool noted on DRE Musculoskeletal:        General: Normal range of motion.     Cervical back: Normal range of motion.  Skin:    General: Skin is warm and dry.     Capillary Refill: Capillary refill takes less than 2 seconds.  Neurological:     General: No focal deficit present.     Mental Status: She is alert.  Psychiatric:        Mood and Affect: Mood normal.     ED Results / Procedures / Treatments   Labs (all labs ordered are listed, but only abnormal results are displayed) Labs Reviewed  COMPREHENSIVE METABOLIC PANEL - Abnormal; Notable for the following components:      Result Value   Glucose, Bld 191 (*)    BUN 59 (*)    Creatinine, Ser 3.07 (*)    Albumin 3.3 (*)    GFR, Estimated 14 (*)    All other components within normal limits  CBC WITH DIFFERENTIAL/PLATELET - Abnormal; Notable for the following components:   RBC 3.45 (*)    Hemoglobin 10.3 (*)    HCT 31.6 (*)    All other components within normal limits  LIPASE, BLOOD  URINALYSIS, ROUTINE W REFLEX MICROSCOPIC    EKG None  Radiology DG Chest Port 1 View  Result Date: 11/10/2021 CLINICAL DATA:  UPPER abdominal pain since yesterday. EXAM: PORTABLE CHEST  1 VIEW COMPARISON:  10/26/2021 FINDINGS: Heart is enlarged, stable in configuration. The aorta is tortuous. Lungs are free of focal consolidations and pleural effusions. No pulmonary edema. IMPRESSION: Stable cardiomegaly. Electronically Signed   By: Nolon Nations M.D.   On: 11/10/2021 14:51    Procedures Procedures    Medications Ordered in ED Medications  sodium phosphate (FLEET) 7-19 GM/118ML enema 1 enema (1 enema Rectal Given 11/10/21 1642)    ED Course/ Medical Decision Making/ A&P  Medical Decision Making Risk OTC drugs.   Social determinants of health:  Social History   Socioeconomic History   Marital status: Divorced    Spouse name: Not on file   Number of children: Not on file   Years of education: Not on file   Highest education level: Not on file  Occupational History   Not on file  Tobacco Use   Smoking status: Never   Smokeless tobacco: Never  Vaping Use   Vaping Use: Never used  Substance and Sexual Activity   Alcohol use: No   Drug use: No   Sexual activity: Not on file  Other Topics Concern   Not on file  Social History Narrative   Patient lives in [place] with her daughter.  1 level home.   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   Differentials include diverticulitis,  gastritis, constipation, kidney stone, UTI, SBO.   Comorbidities affecting care:  Per HPI  Additional history obtained: Daughter  Lab Tests  I Ordered, reviewed, and interpreted labs and EKG.  The pertinent results include:  CMP, CBC and lipase unremarkable   Medicines ordered and prescription drug management:  I ordered medication including: Fleet enema Reevaluation of the patient after these medicines showed that the patient resolved I have reviewed the patients home medicines and have made adjustments as needed    ED Course/Re-evaluation: Presents in no acute distress and is nontoxic-appearing.  She appears chronically ill.  On exam abdomen is soft, nondistended with mild tenderness to palpation of the left lower quadrant and suprapubic region.  She is satting well on room air and vitals are otherwise without significant abnormality.  On DRE, there is notable impacted stool.  I did fecal disimpaction with digital removal and removed large amounts of firm stool.  Patient was also given Fleet enema and on reevaluation reports feeling much improved.  I did consider obtaining CT abdomen, however given location of pain, overall clinical findings and her 3-week history of constipation with notable impacted stool, I did not think it was clinically indicated.  Her labs are otherwise reassuring. As I discussed at discharge with patient, she informs me that she does not have a place to go after being discharged.  She states that she has been staying at a hotel with her eldest daughter and they were put out of the hotel earlier today.  When asked if she had family members that she could stay with, patient states no.  I put in a consult to [facility] and social work will be able to speak to her in the morning.  I will order her home meds in the emergency department and patient can be discharged after [facility] placement tomorrow.  Disposition:  After consideration of the diagnostic results, physical exam,  history and  the patients response to treatment feel that the patent would benefit from discharge.   Fecal impaction: Plan and management as described above. Discharged home in good condition. Final Clinical Impression(s) / ED Diagnoses Final diagnoses:  Fecal impaction in rectum [facility])    Rx / DC Orders ED Discharge Orders     None         Tonye Pearson, [place] 11/10/21 1945    Sherwood Gambler, MD 11/14/21 [phone]

## 2021-11-10 NOTE — ED Notes (Signed)
Pt daughter called wanting to know if the patient was going to be d/c home. I discussed with her that the current plan was a SW consult in the morning for resources/placement. She then says that was not supposed to happen. I asked her if she had somewhere for the patient stay, she says that she would be bringing her to the aunts house tonight, and that they would be staying in another hotel tomorrow until the end of the month when they could secure housing. I went and talked to the provider, and agreed she could be d/c if there was confirmation of a place for her to stay. I then called the daughter [name] back and she says that she talked to the aunt, and its too late, the patient cannot stay there tonight. She said she would try to go to the credit union in the morning to get money for a hotel until the end of the month. Provider made aware of the same.

## 2021-11-10 NOTE — Discharge Instructions (Addendum)
We were able to pull large amounts of stool out today and I am glad that you are feeling much better.  Please continue to take MiraLAX over-the-counter at home to help soften your stools to prevent reimpaction.  Return if you have worsening symptoms.

## 2021-11-10 NOTE — Progress Notes (Signed)
CSW spoke with the PA, Pt will stay overnight APS is now involved as well. Pt can not be discharged with Pt  current health condition and nowhere to go. TOC will follow up in am.

## 2021-11-10 NOTE — ED Notes (Addendum)
APS in triage to speak to patient.

## 2021-11-10 NOTE — ED Notes (Signed)
APS remains at bedside. Pt has been living in a hotel and unable to return. Pt unsure where she will go.

## 2021-11-11 DIAGNOSIS — K5641 Fecal impaction: Secondary | ICD-10-CM | POA: Diagnosis not present

## 2021-11-11 LAB — URINALYSIS, ROUTINE W REFLEX MICROSCOPIC
Bilirubin Urine: NEGATIVE
Glucose, UA: 50 mg/dL — AB
Hgb urine dipstick: NEGATIVE
Ketones, ur: NEGATIVE mg/dL
Leukocytes,Ua: NEGATIVE
Nitrite: NEGATIVE
Protein, ur: >=300 mg/dL — AB
Specific Gravity, Urine: 1.012 (ref 1.005–1.030)
pH: 6 (ref 5.0–8.0)

## 2021-11-11 NOTE — Plan of Care (Signed)
  Problem: Acute Rehab PT Goals(only PT should resolve) Goal: Pt Will Go Supine/Side To Sit Outcome: Progressing Flowsheets (Taken 11/11/2021 1408) Pt will go Supine/Side to Sit:  with supervision  with modified independence Goal: Patient Will Transfer Sit To/From Stand Outcome: Progressing Flowsheets (Taken 11/11/2021 1408) Patient will transfer sit to/from stand:  with supervision  with min guard assist Goal: Pt Will Transfer Bed To Chair/Chair To Bed Outcome: Progressing Flowsheets (Taken 11/11/2021 1408) Pt will Transfer Bed to Chair/Chair to Bed:  with supervision  min guard assist Goal: Pt Will Perform Standing Balance Or Pre-Gait Outcome: Progressing Flowsheets (Taken 11/11/2021 1408) Pt will perform standing balance or pre-gait:  3- 5 min  with bilateral UE support  with Supervision  with Modified Independent Goal: Pt Will Ambulate Outcome: Progressing Flowsheets (Taken 11/11/2021 1408) Pt will Ambulate:  75 feet  with min guard assist  with supervision  with rolling walker   2:09 PM, 11/11/21 Lestine Box, S/PT

## 2021-11-11 NOTE — Evaluation (Signed)
Physical Therapy Evaluation Patient Details Name: Chelsea Meza MRN: [id number] DOB: 1935/08/05 Today's Date: 11/11/2021  History of Present Illness  Chelsea Meza is a 86 y.o. female with history of diabetes type 2, hypertension, CKD, CHF who presents to the ED for evaluation of lower abdominal pain extending from umbilical to left lower quadrant.  Patient states pain got worse starting yesterday.  She states that she has not had a bowel movement in 3 weeks.  She often needs to use digital insertion to break up some of the stool to have small amounts of bowel movement.  She states that she has been taking over-the-counter laxatives without improvement in symptoms.  She denies dysuria, hematuria nausea, vomiting, chest pain, shortness of breath, fevers and chills.   Clinical Impression  Patient presents supine in bed and consents to PT evaluation. Patient is modified independent with bed mobility demonstrating fair trunk control but needs extended time to complete task. Slow mild labored movement observed. Patient is supervision assist with transfers and use of RW. Able to perform transfers without RW, but very unsteady with reaching for objects due to balance deficits and difficulty reaching upright position. Improved balance and coordination with RW use. Patient able to ambulate 45 feet in hallway with min guard and use of RW. Decreased gait speed and difficulty turning due to balance impairments. Mild gait deviations with forward trunk lean impacting base of support but able to maintain appropriate gait pattern. Patient struggles with LE movement when performing sit to supine bed mobility. Patient left in bed with nursing staff notified of mobility status. Patient will benefit from continued skilled physical therapy in hospital and recommended venue below to increase strength, balance, endurance for safe ADLs and gait.      Recommendations for follow up therapy are one component of a  multi-disciplinary discharge planning process, led by the attending physician.  Recommendations may be updated based on patient status, additional functional criteria and insurance authorization.  Follow Up Recommendations Skilled nursing-short term rehab (<3 hours/day) Can patient physically be transported by private vehicle: Yes    Assistance Recommended at Discharge Set up Supervision/Assistance  Patient can return home with the following  A little help with walking and/or transfers;Help with stairs or ramp for entrance;Assistance with cooking/housework    Equipment Recommendations None recommended by PT  Recommendations for Other Services       Functional Status Assessment Patient has had a recent decline in their functional status and demonstrates the ability to make significant improvements in function in a reasonable and predictable amount of time.     Precautions / Restrictions Precautions Precautions: Fall Restrictions Weight Bearing Restrictions: No      Mobility  Bed Mobility Overal bed mobility: Modified Independent             General bed mobility comments: Modified independent with bed mobility. Fair trunk control but needs extended time to complete task. Demonstrates slow mild labored movement.    Transfers Overall transfer level: Needs assistance Equipment used: Rolling walker (2 wheels) Transfers: Sit to/from Stand, Bed to chair/wheelchair/BSC Sit to Stand: Supervision   Step pivot transfers: Supervision       General transfer comment: Supervision assist with transfers with RW. Able to complete transfers without RW but very unsteady with difficulty reaching upright position. Fair coordination with balance deficits observed.    Ambulation/Gait Ambulation/Gait assistance: Min guard Gait Distance (Feet): 45 Feet Assistive device: Rolling walker (2 wheels) Gait Pattern/deviations: Decreased step length - right, Decreased  step length - left, Decreased  stride length, Narrow base of support, Trunk flexed Gait velocity: decreased     General Gait Details: Able to ambulate 45 feet in hallway with min guard and RW. Decreased gait speed due to balance impairments. Mild gait deviations with forward trunk lean impacting base of support but able to maintain appropriate gait pattern.  Stairs            Wheelchair Mobility    Modified Rankin (Stroke Patients Only)       Balance Overall balance assessment: Needs assistance Sitting-balance support: Bilateral upper extremity supported, Feet supported Sitting balance-Leahy Scale: Good Sitting balance - Comments: seated EOB with UE support   Standing balance support: Bilateral upper extremity supported, Reliant on assistive device for balance, During functional activity Standing balance-Leahy Scale: Fair Standing balance comment: Fair with RW                             Pertinent Vitals/Pain Pain Assessment Pain Assessment: No/denies pain    Home Living Family/patient expects to be discharged to:: Private residence Living Arrangements: Children Available Help at Discharge: Family;Available PRN/intermittently Type of Home: House Home Access: Stairs to enter Entrance Stairs-Rails: Right Entrance Stairs-Number of Steps: 2   Home Layout: One level Home Equipment: Conservation officer, nature (2 wheels);Cane - single point      Prior Function Prior Level of Function : Needs assist             Mobility Comments: Patient reports ambulating at home independently with RW and intermittent SPC use. Not a community ambulator and currently not driving. ADLs Comments: Patient reports being independent with ADL's with assist from daughter prn.     Hand Dominance   Dominant Hand: Right    Extremity/Trunk Assessment   Upper Extremity Assessment Upper Extremity Assessment: Overall WFL for tasks assessed    Lower Extremity Assessment Lower Extremity Assessment: Generalized  weakness    Cervical / Trunk Assessment Cervical / Trunk Assessment: Normal  Communication   Communication: No difficulties  Cognition Arousal/Alertness: Awake/alert Behavior During Therapy: WFL for tasks assessed/performed Overall Cognitive Status: Within Functional Limits for tasks assessed                                          General Comments      Exercises     Assessment/Plan    PT Assessment Patient needs continued PT services;All further PT needs can be met in the next venue of care  PT Problem List Decreased strength;Decreased activity tolerance;Decreased balance;Decreased mobility;Decreased coordination       PT Treatment Interventions DME instruction;Gait training;Functional mobility training;Therapeutic activities;Therapeutic exercise;Balance training    PT Goals (Current goals can be found in the Care Plan section)  Acute Rehab PT Goals Patient Stated Goal: return home PT Goal Formulation: With patient Time For Goal Achievement: 11/25/21 Potential to Achieve Goals: Good    Frequency Min 3X/week     Co-evaluation               AM-PAC PT "6 Clicks" Mobility  Outcome Measure Help needed turning from your back to your side while in a flat bed without using bedrails?: A Little Help needed moving from lying on your back to sitting on the side of a flat bed without using bedrails?: A Little Help needed moving to and from  a bed to a chair (including a wheelchair)?: A Little Help needed standing up from a chair using your arms (e.g., wheelchair or bedside chair)?: A Little Help needed to walk in hospital room?: A Little Help needed climbing 3-5 steps with a railing? : A Lot 6 Click Score: 17    End of Session   Activity Tolerance: Patient tolerated treatment well;No increased pain;Patient limited by fatigue Patient left: in bed;with call bell/phone within reach Nurse Communication: Mobility status PT Visit Diagnosis: Unsteadiness on  feet (R26.81);Other abnormalities of gait and mobility (R26.89);Muscle weakness (generalized) (M62.81)    Time: 1207-1228 PT Time Calculation (min) (ACUTE ONLY): 21 min   Charges:   PT Evaluation $PT Eval Moderate Complexity: 1 Mod PT Treatments $Therapeutic Activity: 8-22 mins        2:07 PM, 11/11/21 Lestine Box, S/PT

## 2021-11-11 NOTE — Progress Notes (Signed)
TOC CSW spoke with pt via phone, she reported not having anywhere to go. She reported she was staying at a hotel up until yesterday. Pt requested CSW to speak with her daughter. Pt provided contact number for Carlis Stable [phone])  CSW spoke with pt's daughter Carlis Stable, she stated she and the pt were kicked out of their hotel room yesterday "for no reason." Pt's daughter stated they have been approved for housing in [place] and are in the process of acquiring funds for another hotel until they can move in.  Pt's daughter stated she has spoken with [employer] for assistance, however, they are unable to assist. Pt's daughter stated she spoke with a shelter and they reported they cannot hold beds. The pt's daughter was concerned with her mother's care at the shelter as she works 3rd shift and there is no one there to help her mother while she is at work. CSW provided Social Services and [employer] as resources that may be able to help. Pt's daughter stated she is waiting to hear back from her job to see if they are able to assist with funds for a hotel. PT eval has been order to see if pt would qualify for SNF TOC to follow.  Arlie Solomons.Masud Holub, MSW, [place]  Transitions of Care Clinical Social Worker I Direct Dial: [phone]  Fax: [phone] [email or web address]

## 2021-11-11 NOTE — Progress Notes (Signed)
CSW spoke to daughter and the daughter found housing. CSW also spoke to Pt with the option to go to SNF Pt refused, Pt wants to go with the daughter. Pt will be discharged.

## 2021-11-30 ENCOUNTER — Ambulatory Visit: Payer: Self-pay

## 2021-11-30 NOTE — Patient Outreach (Signed)
  Care Coordination   11/30/2021 Name: Chelsea Meza MRN: [id number] DOB: December 11, 1935   Care Coordination Outreach Attempts:  An unsuccessful telephone outreach was attempted today to offer the patient information about available care coordination services as a benefit of their health plan.   Follow Up Plan:  Additional outreach attempts will be made to offer the patient care coordination information and services.   Encounter Outcome:  No Answer  Care Coordination Interventions Activated:  No   Care Coordination Interventions:  No, not indicated    Daneen Schick, BSW, CDP Social Worker, Certified Dementia Practitioner Care Coordination [phone]

## 2021-12-03 DIAGNOSIS — T148XXA Other injury of unspecified body region, initial encounter: Secondary | ICD-10-CM | POA: Diagnosis not present

## 2021-12-03 DIAGNOSIS — W19XXXA Unspecified fall, initial encounter: Secondary | ICD-10-CM | POA: Diagnosis not present

## 2021-12-05 ENCOUNTER — Ambulatory Visit: Payer: Self-pay

## 2021-12-05 NOTE — Patient Outreach (Signed)
  Care Coordination   12/05/2021 Name: Chelsea Meza MRN: [id number] DOB: Jul 29, 1935   Care Coordination Outreach Attempts:  A second unsuccessful outreach was attempted today to offer the patient with information about available care coordination services as a benefit of their health plan.     Follow Up Plan:  Additional outreach attempts will be made to offer the patient care coordination information and services.   Encounter Outcome:  No Answer  Care Coordination Interventions Activated:  No   Care Coordination Interventions:  No, not indicated    Daneen Schick, BSW, CDP Social Worker, Certified Dementia Practitioner Care Coordination [phone]

## 2021-12-16 ENCOUNTER — Emergency Department (HOSPITAL_COMMUNITY): Payer: Medicare Other

## 2021-12-16 ENCOUNTER — Encounter (HOSPITAL_COMMUNITY): Payer: Self-pay | Admitting: *Deleted

## 2021-12-16 ENCOUNTER — Observation Stay (HOSPITAL_COMMUNITY)
Admission: EM | Admit: 2021-12-16 | Discharge: 2021-12-17 | Disposition: A | Discharge status: Skilled Nursing Facility | Payer: Medicare Other | Attending: Family Medicine | Admitting: Family Medicine

## 2021-12-16 ENCOUNTER — Other Ambulatory Visit: Payer: Self-pay

## 2021-12-16 DIAGNOSIS — Z7901 Long term (current) use of anticoagulants: Secondary | ICD-10-CM | POA: Insufficient documentation

## 2021-12-16 DIAGNOSIS — E079 Disorder of thyroid, unspecified: Secondary | ICD-10-CM | POA: Diagnosis present

## 2021-12-16 DIAGNOSIS — I4891 Unspecified atrial fibrillation: Secondary | ICD-10-CM | POA: Diagnosis not present

## 2021-12-16 DIAGNOSIS — E785 Hyperlipidemia, unspecified: Secondary | ICD-10-CM | POA: Diagnosis present

## 2021-12-16 DIAGNOSIS — N184 Chronic kidney disease, stage 4 (severe): Secondary | ICD-10-CM | POA: Insufficient documentation

## 2021-12-16 DIAGNOSIS — Z743 Need for continuous supervision: Secondary | ICD-10-CM | POA: Diagnosis not present

## 2021-12-16 DIAGNOSIS — N189 Chronic kidney disease, unspecified: Secondary | ICD-10-CM | POA: Diagnosis not present

## 2021-12-16 DIAGNOSIS — I10 Essential (primary) hypertension: Secondary | ICD-10-CM | POA: Diagnosis present

## 2021-12-16 DIAGNOSIS — R609 Edema, unspecified: Secondary | ICD-10-CM | POA: Diagnosis not present

## 2021-12-16 DIAGNOSIS — Z79899 Other long term (current) drug therapy: Secondary | ICD-10-CM | POA: Diagnosis not present

## 2021-12-16 DIAGNOSIS — R5381 Other malaise: Secondary | ICD-10-CM | POA: Diagnosis not present

## 2021-12-16 DIAGNOSIS — Z794 Long term (current) use of insulin: Secondary | ICD-10-CM | POA: Diagnosis not present

## 2021-12-16 DIAGNOSIS — I130 Hypertensive heart and chronic kidney disease with heart failure and stage 1 through stage 4 chronic kidney disease, or unspecified chronic kidney disease: Secondary | ICD-10-CM | POA: Diagnosis not present

## 2021-12-16 DIAGNOSIS — Z8673 Personal history of transient ischemic attack (TIA), and cerebral infarction without residual deficits: Secondary | ICD-10-CM | POA: Diagnosis not present

## 2021-12-16 DIAGNOSIS — R569 Unspecified convulsions: Secondary | ICD-10-CM

## 2021-12-16 DIAGNOSIS — E1122 Type 2 diabetes mellitus with diabetic chronic kidney disease: Secondary | ICD-10-CM | POA: Diagnosis not present

## 2021-12-16 DIAGNOSIS — R600 Localized edema: Secondary | ICD-10-CM | POA: Diagnosis not present

## 2021-12-16 DIAGNOSIS — E1165 Type 2 diabetes mellitus with hyperglycemia: Secondary | ICD-10-CM | POA: Diagnosis not present

## 2021-12-16 DIAGNOSIS — R918 Other nonspecific abnormal finding of lung field: Secondary | ICD-10-CM | POA: Diagnosis not present

## 2021-12-16 DIAGNOSIS — R531 Weakness: Principal | ICD-10-CM

## 2021-12-16 DIAGNOSIS — E1100 Type 2 diabetes mellitus with hyperosmolarity without nonketotic hyperglycemic-hyperosmolar coma (NKHHC): Secondary | ICD-10-CM | POA: Diagnosis present

## 2021-12-16 DIAGNOSIS — I509 Heart failure, unspecified: Secondary | ICD-10-CM | POA: Diagnosis not present

## 2021-12-16 DIAGNOSIS — N179 Acute kidney failure, unspecified: Principal | ICD-10-CM | POA: Insufficient documentation

## 2021-12-16 DIAGNOSIS — N183 Chronic kidney disease, stage 3 unspecified: Secondary | ICD-10-CM | POA: Diagnosis present

## 2021-12-16 DIAGNOSIS — R6889 Other general symptoms and signs: Secondary | ICD-10-CM | POA: Diagnosis not present

## 2021-12-16 DIAGNOSIS — I129 Hypertensive chronic kidney disease with stage 1 through stage 4 chronic kidney disease, or unspecified chronic kidney disease: Secondary | ICD-10-CM | POA: Diagnosis not present

## 2021-12-16 DIAGNOSIS — I160 Hypertensive urgency: Secondary | ICD-10-CM | POA: Diagnosis not present

## 2021-12-16 DIAGNOSIS — I517 Cardiomegaly: Secondary | ICD-10-CM | POA: Diagnosis not present

## 2021-12-16 LAB — CBC WITH DIFFERENTIAL/PLATELET
Abs Immature Granulocytes: 0.02 10*3/uL (ref 0.00–0.07)
Basophils Absolute: 0 10*3/uL (ref 0.0–0.1)
Basophils Relative: 0 %
Eosinophils Absolute: 0.2 10*3/uL (ref 0.0–0.5)
Eosinophils Relative: 3 %
HCT: 29.1 % — ABNORMAL LOW (ref 36.0–46.0)
Hemoglobin: 9.3 g/dL — ABNORMAL LOW (ref 12.0–15.0)
Immature Granulocytes: 0 %
Lymphocytes Relative: 30 %
Lymphs Abs: 1.9 10*3/uL (ref 0.7–4.0)
MCH: 29.5 pg (ref 26.0–34.0)
MCHC: 32 g/dL (ref 30.0–36.0)
MCV: 92.4 fL (ref 80.0–100.0)
Monocytes Absolute: 0.3 10*3/uL (ref 0.1–1.0)
Monocytes Relative: 5 %
Neutro Abs: 4.1 10*3/uL (ref 1.7–7.7)
Neutrophils Relative %: 62 %
Platelets: 186 10*3/uL (ref 150–400)
RBC: 3.15 MIL/uL — ABNORMAL LOW (ref 3.87–5.11)
RDW: 13.4 % (ref 11.5–15.5)
WBC: 6.6 10*3/uL (ref 4.0–10.5)
nRBC: 0 % (ref 0.0–0.2)

## 2021-12-16 LAB — GLUCOSE, CAPILLARY
Glucose-Capillary: 209 mg/dL — ABNORMAL HIGH (ref 70–99)
Glucose-Capillary: 263 mg/dL — ABNORMAL HIGH (ref 70–99)

## 2021-12-16 LAB — BRAIN NATRIURETIC PEPTIDE: B Natriuretic Peptide: 870 pg/mL — ABNORMAL HIGH (ref 0.0–100.0)

## 2021-12-16 LAB — URINALYSIS, ROUTINE W REFLEX MICROSCOPIC
Bilirubin Urine: NEGATIVE
Glucose, UA: 150 mg/dL — AB
Hgb urine dipstick: NEGATIVE
Ketones, ur: NEGATIVE mg/dL
Nitrite: NEGATIVE
Protein, ur: >=300 mg/dL — AB
Specific Gravity, Urine: 1.013 (ref 1.005–1.030)
pH: 5 (ref 5.0–8.0)

## 2021-12-16 LAB — BASIC METABOLIC PANEL
Anion gap: 8 (ref 5–15)
BUN: 74 mg/dL — ABNORMAL HIGH (ref 8–23)
CO2: 23 mmol/L (ref 22–32)
Calcium: 8.7 mg/dL — ABNORMAL LOW (ref 8.9–10.3)
Chloride: 109 mmol/L (ref 98–111)
Creatinine, Ser: 3.32 mg/dL — ABNORMAL HIGH (ref 0.44–1.00)
GFR, Estimated: 13 mL/min — ABNORMAL LOW (ref >60)
Glucose, Bld: 208 mg/dL — ABNORMAL HIGH (ref 70–99)
Potassium: 3.8 mmol/L (ref 3.5–5.1)
Sodium: 140 mmol/L (ref 135–145)

## 2021-12-16 LAB — CBG MONITORING, ED: Glucose-Capillary: 196 mg/dL — ABNORMAL HIGH (ref 70–99)

## 2021-12-16 LAB — T4, FREE: Free T4: 1.22 ng/dL — ABNORMAL HIGH (ref 0.61–1.12)

## 2021-12-16 LAB — CK TOTAL AND CKMB (NOT AT ARMC)
CK, MB: 2.4 ng/mL (ref 0.5–5.0)
Relative Index: INVALID (ref 0.0–2.5)
Total CK: 83 U/L (ref 38–234)

## 2021-12-16 LAB — MAGNESIUM: Magnesium: 2.2 mg/dL (ref 1.7–2.4)

## 2021-12-16 LAB — PHOSPHORUS: Phosphorus: 4.3 mg/dL (ref 2.5–4.6)

## 2021-12-16 LAB — TSH: TSH: 6.574 u[IU]/mL — ABNORMAL HIGH (ref 0.350–4.500)

## 2021-12-16 LAB — VITAMIN B12: Vitamin B-12: 2721 pg/mL — ABNORMAL HIGH (ref 180–914)

## 2021-12-16 LAB — PROTIME-INR
INR: 1.2 (ref 0.8–1.2)
Prothrombin Time: 14.8 seconds (ref 11.4–15.2)

## 2021-12-16 LAB — PREALBUMIN: Prealbumin: 21 mg/dL (ref 18–38)

## 2021-12-16 MED ORDER — ACETAMINOPHEN 325 MG PO TABS: 650.0000 mg | ORAL_TABLET | Freq: Four times a day (QID) | ORAL | Status: DC | PRN

## 2021-12-16 MED ORDER — ACETAMINOPHEN 650 MG RE SUPP: 650.0000 mg | Freq: Four times a day (QID) | RECTAL | Status: DC | PRN

## 2021-12-16 MED ORDER — HEPARIN SODIUM (PORCINE) 5000 UNIT/ML IJ SOLN: 5000.0000 [IU] | Freq: Three times a day (TID) | INTRAMUSCULAR | Status: DC

## 2021-12-16 MED ORDER — SODIUM CHLORIDE 0.9 % IV SOLN: INTRAVENOUS | Status: AC

## 2021-12-16 MED ORDER — HYDRALAZINE HCL 20 MG/ML IJ SOLN
10.0000 mg | INTRAMUSCULAR | Status: DC | PRN
  Administered 2021-12-17: 10 mg via INTRAVENOUS
  Filled 2021-12-16: qty 1

## 2021-12-16 MED ORDER — ONDANSETRON HCL 4 MG PO TABS: 4.0000 mg | ORAL_TABLET | Freq: Four times a day (QID) | ORAL | Status: DC | PRN

## 2021-12-16 MED ORDER — SODIUM CHLORIDE 0.9 % IV SOLN: INTRAVENOUS | Status: DC

## 2021-12-16 MED ORDER — IPRATROPIUM BROMIDE 0.02 % IN SOLN: 0.5000 mg | Freq: Four times a day (QID) | RESPIRATORY_TRACT | Status: DC | PRN

## 2021-12-16 MED ORDER — AMLODIPINE BESYLATE 5 MG PO TABS
10.0000 mg | ORAL_TABLET | Freq: Every day | ORAL | Status: DC
  Administered 2021-12-17: 10 mg via ORAL
  Filled 2021-12-16 (×2): qty 2

## 2021-12-16 MED ORDER — VITAMIN B-12 1000 MCG PO TABS
1500.0000 ug | ORAL_TABLET | Freq: Every day | ORAL | Status: DC
  Administered 2021-12-16 – 2021-12-17 (×2): 1500 ug via ORAL
  Filled 2021-12-16 (×2): qty 2

## 2021-12-16 MED ORDER — AMLODIPINE BESYLATE 5 MG PO TABS
10.0000 mg | ORAL_TABLET | Freq: Once | ORAL | Status: AC
  Administered 2021-12-16: 10 mg via ORAL
  Filled 2021-12-16: qty 2

## 2021-12-16 MED ORDER — HYDRALAZINE HCL 25 MG PO TABS
100.0000 mg | ORAL_TABLET | Freq: Once | ORAL | Status: AC
  Administered 2021-12-16: 100 mg via ORAL
  Filled 2021-12-16: qty 4

## 2021-12-16 MED ORDER — INSULIN GLARGINE-YFGN 100 UNIT/ML ~~LOC~~ SOLN
25.0000 [IU] | Freq: Every day | SUBCUTANEOUS | Status: DC
  Administered 2021-12-16: 25 [IU] via SUBCUTANEOUS
  Filled 2021-12-16 (×2): qty 0.25

## 2021-12-16 MED ORDER — CARVEDILOL 3.125 MG PO TABS
3.1250 mg | ORAL_TABLET | Freq: Two times a day (BID) | ORAL | Status: DC
Start: 2021-12-16 — End: 2021-12-16
  Filled 2021-12-16: qty 1

## 2021-12-16 MED ORDER — HYDROMORPHONE HCL 1 MG/ML IJ SOLN: 0.5000 mg | INTRAMUSCULAR | Status: DC | PRN

## 2021-12-16 MED ORDER — SENNOSIDES-DOCUSATE SODIUM 8.6-50 MG PO TABS: 1.0000 | ORAL_TABLET | Freq: Every evening | ORAL | Status: DC | PRN

## 2021-12-16 MED ORDER — SODIUM CHLORIDE 0.9% FLUSH: 3.0000 mL | Freq: Two times a day (BID) | INTRAVENOUS | Status: DC

## 2021-12-16 MED ORDER — FLEET ENEMA 7-19 GM/118ML RE ENEM: 1.0000 | ENEMA | Freq: Once | RECTAL | Status: DC | PRN

## 2021-12-16 MED ORDER — APIXABAN 2.5 MG PO TABS: 2.5000 mg | ORAL_TABLET | Freq: Two times a day (BID) | ORAL | Status: DC

## 2021-12-16 MED ORDER — ONDANSETRON HCL 4 MG/2ML IJ SOLN: 4.0000 mg | Freq: Four times a day (QID) | INTRAMUSCULAR | Status: DC | PRN

## 2021-12-16 MED ORDER — SODIUM CHLORIDE 0.9 % IV SOLN: 250.0000 mL | INTRAVENOUS | Status: DC | PRN

## 2021-12-16 MED ORDER — HYDRALAZINE HCL 25 MG PO TABS
100.0000 mg | ORAL_TABLET | Freq: Two times a day (BID) | ORAL | Status: DC
  Administered 2021-12-16 – 2021-12-17 (×2): 100 mg via ORAL
  Filled 2021-12-16 (×3): qty 4

## 2021-12-16 MED ORDER — CARVEDILOL 3.125 MG PO TABS
3.1250 mg | ORAL_TABLET | Freq: Two times a day (BID) | ORAL | Status: DC
  Administered 2021-12-16 – 2021-12-17 (×2): 3.125 mg via ORAL
  Filled 2021-12-16 (×2): qty 1

## 2021-12-16 MED ORDER — TRAZODONE HCL 50 MG PO TABS: 25.0000 mg | ORAL_TABLET | Freq: Every evening | ORAL | Status: DC | PRN

## 2021-12-16 MED ORDER — BISACODYL 5 MG PO TBEC: 5.0000 mg | DELAYED_RELEASE_TABLET | Freq: Every day | ORAL | Status: DC | PRN

## 2021-12-16 MED ORDER — ATORVASTATIN CALCIUM 40 MG PO TABS: 40.0000 mg | ORAL_TABLET | Freq: Every day | ORAL | Status: DC

## 2021-12-16 MED ORDER — CARVEDILOL 3.125 MG PO TABS
3.1250 mg | ORAL_TABLET | Freq: Once | ORAL | Status: AC
  Administered 2021-12-16: 3.125 mg via ORAL
  Filled 2021-12-16: qty 1

## 2021-12-16 MED ORDER — LEVALBUTEROL HCL 0.63 MG/3ML IN NEBU: 0.6300 mg | INHALATION_SOLUTION | Freq: Four times a day (QID) | RESPIRATORY_TRACT | Status: DC | PRN

## 2021-12-16 MED ORDER — APIXABAN 2.5 MG PO TABS
2.5000 mg | ORAL_TABLET | Freq: Two times a day (BID) | ORAL | Status: DC
  Administered 2021-12-16 – 2021-12-17 (×3): 2.5 mg via ORAL
  Filled 2021-12-16 (×3): qty 1

## 2021-12-16 MED ORDER — ATORVASTATIN CALCIUM 40 MG PO TABS
40.0000 mg | ORAL_TABLET | Freq: Every day | ORAL | Status: DC
Start: 2021-12-16 — End: 2021-12-16

## 2021-12-16 MED ORDER — SODIUM CHLORIDE 0.9% FLUSH: 3.0000 mL | INTRAVENOUS | Status: DC | PRN

## 2021-12-16 MED ORDER — OXYCODONE HCL 5 MG PO TABS: 5.0000 mg | ORAL_TABLET | ORAL | Status: DC | PRN

## 2021-12-16 MED ORDER — INSULIN ASPART 100 UNIT/ML IJ SOLN
0.0000 [IU] | Freq: Three times a day (TID) | INTRAMUSCULAR | Status: DC
  Administered 2021-12-16: 2 [IU] via SUBCUTANEOUS

## 2021-12-16 NOTE — Evaluation (Signed)
Physical Therapy Evaluation Patient Details Name: Chelsea Meza MRN: [id number] DOB: [birth year] Today's Date: 12/16/2021  History of Present Illness  Jenilyn H. Dyches is a 86 y/o female with c/oPt states she can usually get around using her walker but for the last 2 days pt has been unable to get to standing position to use her walker   Clinical Impression  Patient demonstrates slow labored movement for sitting up at bedside with most difficulty moving BLE due weakness, slightly unsteady on feet using RW, at risk for falls and limited for ambulation mostly due to c/o fatigue and generalized weakness.  Patient required Min assist to move legs when put back to bed due to weakness. Patient will benefit from continued skilled physical therapy in hospital and recommended venue below to increase strength, balance, endurance for safe ADLs and gait.         Recommendations for follow up therapy are one component of a multi-disciplinary discharge planning process, led by the attending physician.  Recommendations may be updated based on patient status, additional functional criteria and insurance authorization.  Follow Up Recommendations Skilled nursing-short term rehab (<3 hours/day) Can patient physically be transported by private vehicle: Yes    Assistance Recommended at Discharge Intermittent Supervision/Assistance  Patient can return home with the following  A lot of help with walking and/or transfers;A little help with bathing/dressing/bathroom;Assistance with cooking/housework;Help with stairs or ramp for entrance    Equipment Recommendations None recommended by PT  Recommendations for Other Services       Functional Status Assessment Patient has had a recent decline in their functional status and demonstrates the ability to make significant improvements in function in a reasonable and predictable amount of time.     Precautions / Restrictions Precautions Precautions:  Fall Restrictions Weight Bearing Restrictions: No      Mobility  Bed Mobility Overal bed mobility: Needs Assistance Bed Mobility: Supine to Sit, Sit to Supine     Supine to sit: Min guard, Min assist Sit to supine: Min assist   General bed mobility comments: slow labored movment with most difficulty lifting legs onto bed during sit to supine    Transfers Overall transfer level: Needs assistance Equipment used: Rolling walker (2 wheels) Transfers: Sit to/from Stand, Bed to chair/wheelchair/BSC Sit to Stand: Min guard, Min assist   Step pivot transfers: Min assist       General transfer comment: slow labored movement    Ambulation/Gait Ambulation/Gait assistance: Min assist Gait Distance (Feet): 30 Feet Assistive device: Rolling walker (2 wheels) Gait Pattern/deviations: Decreased step length - right, Decreased step length - left, Decreased stride length Gait velocity: decreased     General Gait Details: slow labored slightly unsteady cadence and limited mostly due to fatigue/generalized weakness  Stairs            Wheelchair Mobility    Modified Rankin (Stroke Patients Only)       Balance Overall balance assessment: Needs assistance Sitting-balance support: Feet supported, Bilateral upper extremity supported Sitting balance-Leahy Scale: Fair Sitting balance - Comments: fair/good seated at EOB   Standing balance support: Reliant on assistive device for balance, During functional activity, Bilateral upper extremity supported Standing balance-Leahy Scale: Fair Standing balance comment: using RW                             Pertinent Vitals/Pain Pain Assessment Pain Assessment: No/denies pain    Home Living Family/patient expects to be discharged  to:: Private residence Living Arrangements: Children Available Help at Discharge: Family;Available PRN/intermittently Type of Home: House Home Access: Stairs to enter Entrance Stairs-Rails:  Right Entrance Stairs-Number of Steps: 2   Home Layout: One level Home Equipment: Conservation officer, nature (2 wheels);Cane - single point      Prior Function Prior Level of Function : Needs assist             Mobility Comments: Patient reports ambulating at home independently with RW and intermittent SPC use. Not a community ambulator and currently not driving. ADLs Comments: Patient reports being independent with ADL's with assist from daughter prn.     Hand Dominance   Dominant Hand: Right    Extremity/Trunk Assessment   Upper Extremity Assessment Upper Extremity Assessment: Generalized weakness    Lower Extremity Assessment Lower Extremity Assessment: Generalized weakness    Cervical / Trunk Assessment Cervical / Trunk Assessment: Kyphotic  Communication   Communication: No difficulties  Cognition Arousal/Alertness: Awake/alert Behavior During Therapy: WFL for tasks assessed/performed Overall Cognitive Status: Within Functional Limits for tasks assessed                                          General Comments      Exercises     Assessment/Plan    PT Assessment Patient needs continued PT services  PT Problem List Decreased strength;Decreased activity tolerance;Decreased balance;Decreased mobility       PT Treatment Interventions DME instruction;Gait training;Stair training;Functional mobility training;Therapeutic activities;Therapeutic exercise;Balance training;Patient/family education    PT Goals (Current goals can be found in the Care Plan section)  Acute Rehab PT Goals Patient Stated Goal: return home after rehab PT Goal Formulation: With patient/family Time For Goal Achievement: 12/30/21 Potential to Achieve Goals: Good    Frequency Min 2X/week     Co-evaluation               AM-PAC PT "6 Clicks" Mobility  Outcome Measure Help needed turning from your back to your side while in a flat bed without using bedrails?: A  Little Help needed moving from lying on your back to sitting on the side of a flat bed without using bedrails?: A Little Help needed moving to and from a bed to a chair (including a wheelchair)?: A Little Help needed standing up from a chair using your arms (e.g., wheelchair or bedside chair)?: A Little Help needed to walk in hospital room?: A Lot Help needed climbing 3-5 steps with a railing? : A Lot 6 Click Score: 16    End of Session   Activity Tolerance: Patient tolerated treatment well;Patient limited by fatigue Patient left: in bed;with call bell/phone within reach;with family/visitor present Nurse Communication: Mobility status PT Visit Diagnosis: Unsteadiness on feet (R26.81);Other abnormalities of gait and mobility (R26.89);Muscle weakness (generalized) (M62.81)    Time: 9629-5284 PT Time Calculation (min) (ACUTE ONLY): 24 min   Charges:   PT Evaluation $PT Eval Moderate Complexity: 1 Mod PT Treatments $Therapeutic Activity: 23-37 mins        11:58 AM, 12/16/21 [name], MPT Physical Therapist with [facility] [phone] office [phone] mobile phone

## 2021-12-16 NOTE — Progress Notes (Signed)
Pt brought to room 323 via stretcher from ED by staff. Pt moved from stretcher to bed by staff x4, pt states she is unable to stand due to leg weakness and swelling. Pt cleaned of incontinent urine and soft brown stool. Sacral foam applied to prevent skin breakdown. Pure wick applied per pt request. B/P elevated, pt due scheduled b/p meds at this time. Other VSS. IVF infusing without difficulty, site WDP.  Pt alert and oriented x4, denies c/o pain. Oriented to room and safety procedures, both pt and daughter state understanding. Bed alarm on and call bell in reach for safety.

## 2021-12-16 NOTE — Assessment & Plan Note (Signed)
-   In hypertensive state -Resuming home medications: Including Norvasc, Coreg, hydralazine -Holding Lasix due to dehydration, a on CKD

## 2021-12-16 NOTE — Assessment & Plan Note (Addendum)
-   Last A1c 10/26/2021: 6.7 -Continue long-acting insulin, SSI coverage We will titrate insulin for tighter glycemic control

## 2021-12-16 NOTE — ED Triage Notes (Signed)
Pt states she can usually get around using her walker but for the last 2 days pt has been unable to get to standing position to use her walker  Pt denies any pain Cbg 241  Ems reports daughter called and told ems pt has been getting weaker and the family would like pt placed in rehab

## 2021-12-16 NOTE — Assessment & Plan Note (Signed)
-   Severe progressive debility generalized -Progressive getting worse -Needing assist with all ADLs now -Fall precautions -PT OT consulted for evaluation recommended- SNF -TOC consult for possible SNF placement

## 2021-12-16 NOTE — ED Notes (Signed)
This RN attempted x2 for IV access without success. 

## 2021-12-16 NOTE — ED Notes (Signed)
PT seen getting pt up and walking around in the room

## 2021-12-16 NOTE — Hospital Course (Signed)
Chelsea Meza 86 year old female with history of HTN, HLD, DM 2, CKD IV, CHF, A-fib Chronically anticoagulated on Eliquis Presenting with progressive severe generalized weaknesses at baseline she walks around with a walker now she needs assist with all ADLs including ambulation. For past 2 days unable to stand.  Patient has had recent hospitalization for dehydration and generalized weakness, new onset A-fib..  But upon discharge was able to ambulate with assist and walker.  Patient reports of global generalized weaknesses denies having asymmetric weaknesses.  Denies of having any falls denies any back pain or numbness. History from the chart also reports chronic paraparesis and prior known paresthesia on her left leg.   ED: Blood pressure (!) 187/93, pulse 72, temperature 97.8 F (36.6 C), temperature source Oral, resp. rate 15, height '5\' 9"'$  (1.753 m), weight 65.8 kg, SpO2 100 %.    Latest Ref Rng & Units 12/16/2021    9:46 AM 11/10/2021    3:27 PM 10/28/2021    5:11 AM  BMP  Glucose 70 - 99 mg/dL 208  191  53   BUN 8 - 23 mg/dL 74  59  57   Creatinine 0.44 - 1.00 mg/dL 3.32  3.07  3.21   Sodium 135 - 145 mmol/L 140  139  139   Potassium 3.5 - 5.1 mmol/L 3.8  4.5  4.0   Chloride 98 - 111 mmol/L 109  106  109   CO2 22 - 32 mmol/L '23  27  24   '$ Calcium 8.9 - 10.3 mg/dL 8.7  8.9  8.8    Upon evaluation patient was unable to stand, but was moving all 4 extremities,  Requested patient to be admitted for dehydration acute on chronic CKD, severe debility,

## 2021-12-16 NOTE — Assessment & Plan Note (Addendum)
-   Continue Coreg and Eliquis  -2D echocardiogram from 6/29/203 reviewed: IMPRESSIONS Echocardiographic features concerning for infiltrative disease such as amyloidosis. EJF:  60 to 65%. Severe LVH  Left and Right ventricular diastolic function is normal.

## 2021-12-16 NOTE — Plan of Care (Signed)
  Problem: Acute Rehab PT Goals(only PT should resolve) Goal: Pt Will Go Supine/Side To Sit Outcome: Progressing Flowsheets (Taken 12/16/2021 1200) Pt will go Supine/Side to Sit:  with supervision  with min guard assist Goal: Patient Will Transfer Sit To/From Stand Outcome: Progressing Flowsheets (Taken 12/16/2021 1200) Patient will transfer sit to/from stand:  with supervision  with min guard assist Goal: Pt Will Transfer Bed To Chair/Chair To Bed Outcome: Progressing Flowsheets (Taken 12/16/2021 1200) Pt will Transfer Bed to Chair/Chair to Bed:  min guard assist  with min assist Goal: Pt Will Ambulate Outcome: Progressing Flowsheets (Taken 12/16/2021 1200) Pt will Ambulate:  50 feet  with min guard assist  with rolling walker  with minimal assist   12:00 PM, 12/16/21 [name], MPT Physical Therapist with [facility] [phone] office [phone] mobile phone

## 2021-12-16 NOTE — Assessment & Plan Note (Signed)
-   As needed hydralazine, resuming home medication No focal neurological findings

## 2021-12-16 NOTE — Assessment & Plan Note (Signed)
Continue atorvastatin

## 2021-12-16 NOTE — Assessment & Plan Note (Signed)
-   Resuming Lasix now  -Bilateral compression stockings

## 2021-12-16 NOTE — Assessment & Plan Note (Signed)
-   Based on electronic record seizures listed -Patient denies, currently not on any medications

## 2021-12-16 NOTE — Assessment & Plan Note (Signed)
-   Resuming her long-acting insulin at 25 units nightly - CBG QA [facility] with SSI coverage

## 2021-12-16 NOTE — NC FL2 (Signed)
[place] MEDICAID FL2 LEVEL OF CARE SCREENING TOOL     IDENTIFICATION  Patient Name: Chelsea Meza Birthdate: [birth year] Sex: female Admission Date (Current Location): 12/16/2021  [facility] and [place] Number:  [employer] and Address:  [place] [address] [place]      Provider Number: [id number]  Attending Physician Name and Address:  Varney Biles, MD  Relative Name and Phone Number:  Karcyn Menn - [phone]    Current Level of Care: Hospital Recommended Level of Care: [place] Prior Approval Number:    Date Approved/Denied:   PASRR Number: [id number]  Discharge Plan: SNF    Current Diagnoses: Patient Active Problem List   Diagnosis Date Noted   Pressure injury of skin 02/24/2021   Seizures ([place]) 27/51/7001   Acute metabolic encephalopathy 74/94/4967   Acute lower UTI 02/23/2021   Elevated troponin 02/23/2021   DKA (diabetic ketoacidosis) ([place]) 02/23/2021   Malnutrition of moderate degree 06/23/2020   Poorly controlled diabetes mellitus ([place]) 06/21/2020   Hypoglycemia associated with diabetes ([employer]) 06/21/2020   Hyperosmolar hyperglycemic state (HHS) ([place]) 04/05/2020   Hypertensive urgency 04/05/2020   Cardiac arrest ([place]) 04/05/2020   Acute congestive heart failure ([place]) 10/18/2018   Hyperosmolar non-ketotic state in patient with type 2 diabetes mellitus ([place]) 10/18/2018   Transient cerebral ischemia 11/05/2017   Hypokalemia 11/05/2017   Hyponatremia 03/20/2015   AKI (acute kidney injury) ([place]) 03/20/2015   CKD (chronic kidney disease) stage 3, GFR 30-59 ml/min ([name]) 03/20/2015   Hyperglycemia due to diabetes mellitus ([place]) 03/20/2015   Hyperlipidemia 03/20/2015   Diabetic hyperosmolar non-ketotic state ([place]) 07/14/2012   Lactic acidosis 07/14/2012   DKA, type 2 ([place]) 07/13/2012   Acute kidney injury superimposed on CKD ([place]) 07/13/2012   Paroxysmal supraventricular tachycardia ([place])  07/13/2012   Paraparesis ([place]) 07/13/2012   Paresthesia of left leg 07/13/2012   Cough 07/13/2012   Essential hypertension 07/13/2012   Type 2 diabetes mellitus with ketoacidosis without coma ([place]) 07/13/2012   Paresthesia of skin 07/13/2012    Orientation RESPIRATION BLADDER Height & Weight     Self, Time, Situation, Place  Normal Continent Weight: 145 lb (65.8 kg) Height:  5' 9" (175.3 cm)  BEHAVIORAL SYMPTOMS/MOOD NEUROLOGICAL BOWEL NUTRITION STATUS      Continent Diet (Regular)  AMBULATORY STATUS COMMUNICATION OF NEEDS Skin   Limited Assist Verbally Normal                       Personal Care Assistance Level of Assistance  Bathing, Feeding, Dressing Bathing Assistance: Limited assistance Feeding assistance: Independent Dressing Assistance: Limited assistance     Functional Limitations Info  Sight, Hearing, Speech Sight Info: Adequate Hearing Info: Adequate Speech Info: Adequate    SPECIAL CARE FACTORS FREQUENCY  PT (By licensed PT), OT (By licensed OT)     PT Frequency: x5/week OT Frequency: x5/week            Contractures Contractures Info: Not present    Additional Factors Info  Code Status, Allergies, Psychotropic Code Status Info: Full Allergies Info: None Psychotropic Info: None         Current Medications (12/16/2021):  This is the current hospital active medication list Current Facility-Administered Medications  Medication Dose Route Frequency Provider Last Rate Last Admin   apixaban (ELIQUIS) tablet 2.5 mg  2.5 mg Oral BID Evalee Jefferson, PA-C   2.5 mg at 12/16/21 1152   Current Outpatient Medications  Medication Sig  Dispense Refill   amLODipine (NORVASC) 10 MG tablet Take 1 tablet (10 mg total) by mouth daily. 30 tablet 1   apixaban (ELIQUIS) 2.5 MG TABS tablet Take 1 tablet (2.5 mg total) by mouth 2 (two) times daily. 180 tablet 0   atorvastatin (LIPITOR) 40 MG tablet Take 40 mg by mouth at bedtime.     carvedilol (COREG) 3.125 MG tablet  Take 1 tablet (3.125 mg total) by mouth 2 (two) times daily with a meal. 60 tablet 1   Cyanocobalamin (VITAMIN B-12 CR PO) Take 1 tablet by mouth daily.     feeding supplement (ENSURE ENLIVE / ENSURE PLUS) LIQD Take 237 mLs by mouth 2 (two) times daily between meals. 237 mL 12   furosemide (LASIX) 40 MG tablet Take 1 tablet (40 mg total) by mouth daily. 30 tablet    hydrALAZINE (APRESOLINE) 100 MG tablet Take 1 tablet (100 mg total) by mouth 3 (three) times daily. (Patient taking differently: Take 100 mg by mouth 2 (two) times daily.) 90 tablet 1   insulin glargine (LANTUS) 100 UNIT/ML Solostar Pen INJECT 25 UNITS INTO THE SKIN DAILY. (Patient taking differently: Inject 25 Units into the skin daily.) 15 mL 11   Accu-Chek FastClix Lancets MISC      ACCU-CHEK GUIDE test strip USE TO TEST TWICE DAILY.AL     Blood Glucose Monitoring Suppl (ACCU-CHEK GUIDE) w/Device KIT USE AS DIRECTED.I     GVOKE PFS 0.5 MG/0.1ML SOSY Inject 0.5 mg into the skin as needed (low bs).     insulin lispro (HUMALOG) 100 UNIT/ML KwikPen Inject 6 Units into the skin in the morning, at noon, and at bedtime. (Patient not taking: Reported on 12/16/2021)     Insulin Pen Needle (PEN NEEDLES) 32G X 4 MM MISC Use as directed 100 each 11   lidocaine (LIDODERM) 5 % Place 1 patch onto the skin daily. Remove & Discard patch within 12 hours or as directed by MD (Patient not taking: Reported on 12/16/2021) 30 patch 0     Discharge Medications: Please see discharge summary for a list of discharge medications.  Relevant Imaging Results:  Relevant Lab Results:   Additional Information [id number]  Kimber Relic, LCSW

## 2021-12-16 NOTE — ED Provider Notes (Signed)
[facility] EMERGENCY DEPARTMENT Provider Note   CSN: [id number] Arrival date & time: 12/16/21  0254     History  Chief Complaint  Patient presents with   Weakness    Chelsea Meza is a 86 y.o. female with a history including type 2 diabetes, including DKA, chronic kidney disease, hypertension, CHF, history of metabolic encephalopathy, recent admission for dehydration and new onset atrial fibrillation presenting for evaluation of generalized weakness.  Patient states that over the past several days she has had increasing problems trying to weight-bear and ambulate.  She lives at home with a daughter, and usually is able to ambulate using a walker.  Over the past several days she has had increased weakness and yesterday evening she could not get out of a chair with the assistance of her daughter in order to get to her bed and had to call a cousin to help get her into bed.  She has been unable to weight-bear upon waking this morning as well.  She does endorse aching pain in her bilateral legs from the knees down and has had increased swelling in her legs.  She denies any new falls, denies low back pain, urinary incontinence or retention.  She has a documented history per chart of paraparesis and prior known paresthesia of the left leg.  She and family endorse need for placement in rehab.  Of note, patient's blood pressure is elevated upon presentation at 221/112.  She has not had any of her morning medications prior to arrival.  She denies headache, focal weakness.  Her a.m. blood pressure medications have been ordered.    The history is provided by the patient and medical records.       Home Medications Prior to Admission medications   Medication Sig Start Date End Date Taking? Authorizing Provider  amLODipine (NORVASC) 10 MG tablet Take 1 tablet (10 mg total) by mouth daily. 10/28/21 12/27/21 Yes Shah, Pratik D, DO  apixaban (ELIQUIS) 2.5 MG TABS tablet Take 1 tablet (2.5 mg total) by  mouth 2 (two) times daily. 10/28/21 01/26/22 Yes Shah, Pratik D, DO  atorvastatin (LIPITOR) 40 MG tablet Take 40 mg by mouth at bedtime. 10/19/21  Yes [provider]  carvedilol (COREG) 3.125 MG tablet Take 1 tablet (3.125 mg total) by mouth 2 (two) times daily with a meal. 10/28/21 12/27/21 Yes Shah, Pratik D, DO  Cyanocobalamin (VITAMIN B-12 CR PO) Take 1 tablet by mouth daily.   Yes [provider]  feeding supplement (ENSURE ENLIVE / ENSURE PLUS) LIQD Take 237 mLs by mouth 2 (two) times daily between meals. 10/28/21  Yes Shah, Pratik D, DO  furosemide (LASIX) 40 MG tablet Take 1 tablet (40 mg total) by mouth daily. 02/28/21  Yes Johnson, Clanford L, MD  hydrALAZINE (APRESOLINE) 100 MG tablet Take 1 tablet (100 mg total) by mouth 3 (three) times daily. Patient taking differently: Take 100 mg by mouth 2 (two) times daily. 10/22/18  Yes Tat, Shanon Brow, MD  insulin glargine (LANTUS) 100 UNIT/ML Solostar Pen INJECT 25 UNITS INTO THE SKIN DAILY. Patient taking differently: Inject 25 Units into the skin daily. 06/23/20 12/16/21 Yes Ghimire, Henreitta Leber, MD  Accu-Chek FastClix Lancets MISC  04/09/20   [provider]  ACCU-CHEK GUIDE test strip USE TO TEST TWICE DAILY.AL 07/05/20   [provider]  Blood Glucose Monitoring Suppl (ACCU-CHEK GUIDE) w/Device KIT USE AS DIRECTED.I 09/03/20   [provider]  GVOKE PFS 0.5 MG/0.1ML SOSY Inject 0.5 mg into the  skin as needed (low bs). 02/08/21   [provider]  insulin lispro (HUMALOG) 100 UNIT/ML KwikPen Inject 6 Units into the skin in the morning, at noon, and at bedtime. Patient not taking: Reported on 12/16/2021 08/13/21   [provider]  Insulin Pen Needle (PEN NEEDLES) 32G X 4 MM MISC Use as directed 06/23/20   Ghimire, Henreitta Leber, MD  lidocaine (LIDODERM) 5 % Place 1 patch onto the skin daily. Remove & Discard patch within 12 hours or as directed by MD Patient not taking: Reported on 12/16/2021 04/16/21    Evalee Jefferson, PA-C      Allergies    Patient has no known allergies.    Review of Systems   Review of Systems  Constitutional:  Negative for fever.  HENT:  Negative for congestion and sore throat.   Eyes: Negative.   Respiratory:  Negative for chest tightness and shortness of breath.   Cardiovascular:  Positive for leg swelling. Negative for chest pain and palpitations.  Gastrointestinal:  Negative for abdominal pain, nausea and vomiting.  Genitourinary: Negative.  Negative for dysuria.  Musculoskeletal:  Positive for arthralgias and gait problem. Negative for neck pain.  Skin: Negative.  Negative for rash and wound.  Neurological:  Positive for weakness. Negative for dizziness, light-headedness, numbness and headaches.  Psychiatric/Behavioral: Negative.      Physical Exam Updated Vital Signs BP (!) 187/93   Pulse 72   Temp 97.8 F (36.6 C) (Oral)   Resp 15   Ht '5\' 9"'  (1.753 m)   Wt 65.8 kg   SpO2 100%   BMI 21.41 kg/m  Physical Exam Vitals and nursing note reviewed.  Constitutional:      Appearance: She is well-developed.  HENT:     Head: Normocephalic and atraumatic.  Eyes:     Conjunctiva/sclera: Conjunctivae normal.  Cardiovascular:     Rate and Rhythm: Normal rate. Rhythm irregular.     Heart sounds: Normal heart sounds.  Pulmonary:     Effort: Pulmonary effort is normal.     Breath sounds: Normal breath sounds. No wheezing.  Abdominal:     General: Bowel sounds are normal.     Palpations: Abdomen is soft.     Tenderness: There is no abdominal tenderness.  Musculoskeletal:        General: Swelling present. Normal range of motion.     Cervical back: Normal range of motion.     Comments: Bilateral lower extremity edema.  Skin:    General: Skin is warm and dry.  Neurological:     Mental Status: She is alert and oriented to person, place, and time.     Cranial Nerves: Cranial nerves 2-12 are intact. No cranial nerve deficit.     Sensory: No sensory deficit.      Motor: Weakness present.     Comments: Equal grip strength, negative pronator drift.  3 out of 5 strength bilateral lower extremities, can lift against gravity only.  Ankle flexion extension intact.     ED Results / Procedures / Treatments   Labs (all labs ordered are listed, but only abnormal results are displayed) Labs Reviewed  BASIC METABOLIC PANEL - Abnormal; Notable for the following components:      Result Value   Glucose, Bld 208 (*)    BUN 74 (*)    Creatinine, Ser 3.32 (*)    Calcium 8.7 (*)    GFR, Estimated 13 (*)    All other components within normal limits  URINALYSIS,  ROUTINE W REFLEX MICROSCOPIC - Abnormal; Notable for the following components:   APPearance HAZY (*)    Glucose, UA 150 (*)    Protein, ur >=300 (*)    Leukocytes,Ua SMALL (*)    Bacteria, UA RARE (*)    All other components within normal limits  CBC WITH DIFFERENTIAL/PLATELET - Abnormal; Notable for the following components:   RBC 3.15 (*)    Hemoglobin 9.3 (*)    HCT 29.1 (*)    All other components within normal limits  CBG MONITORING, ED - Abnormal; Notable for the following components:   Glucose-Capillary 196 (*)    All other components within normal limits  URINE CULTURE  EXPECTORATED SPUTUM ASSESSMENT W GRAM STAIN, RFLX TO RESP C  CBC  CREATININE, SERUM  MAGNESIUM  PHOSPHORUS  PROTIME-INR    EKG EKG Interpretation  Date/Time:  Friday December 16 2021 09:37:18 EDT Ventricular Rate:  64 PR Interval:    QRS Duration: 96 QT Interval:  422 QTC Calculation: 436 R Axis:   -12 Text Interpretation: Atrial fibrillation Inferior infarct, old Anterior infarct, old No acute changes No significant change since last tracing Confirmed by Varney Biles [phone]) on 12/16/2021 12:35:15 PM  Radiology DG Chest Portable 1 View  Result Date: 12/16/2021 CLINICAL DATA:  Difficult the rising to standing position. EXAM: PORTABLE CHEST 1 VIEW COMPARISON:  11/10/2021 FINDINGS: Lungs are hypoinflated  and otherwise clear. Mild stable cardiomegaly. Remainder of the exam is unchanged. IMPRESSION: Hypoinflation without acute cardiopulmonary disease. Electronically Signed   By: Marin Olp M.D.   On: 12/16/2021 13:15    Procedures Procedures    Medications Ordered in ED Medications  apixaban (ELIQUIS) tablet 2.5 mg (2.5 mg Oral Given 12/16/21 1152)  sodium chloride flush (NS) 0.9 % injection 3 mL (has no administration in time range)  0.9 %  sodium chloride infusion (has no administration in time range)  sodium chloride flush (NS) 0.9 % injection 3 mL (has no administration in time range)  sodium chloride flush (NS) 0.9 % injection 3 mL (has no administration in time range)  0.9 %  sodium chloride infusion (has no administration in time range)  acetaminophen (TYLENOL) tablet 650 mg (has no administration in time range)    Or  acetaminophen (TYLENOL) suppository 650 mg (has no administration in time range)  oxyCODONE (Oxy IR/ROXICODONE) immediate release tablet 5 mg (has no administration in time range)  HYDROmorphone (DILAUDID) injection 0.5-1 mg (has no administration in time range)  traZODone (DESYREL) tablet 25 mg (has no administration in time range)  senna-docusate (Senokot-S) tablet 1 tablet (has no administration in time range)  bisacodyl (DULCOLAX) EC tablet 5 mg (has no administration in time range)  ondansetron (ZOFRAN) tablet 4 mg (has no administration in time range)    Or  ondansetron (ZOFRAN) injection 4 mg (has no administration in time range)  ipratropium (ATROVENT) nebulizer solution 0.5 mg (has no administration in time range)  levalbuterol (XOPENEX) nebulizer solution 0.63 mg (has no administration in time range)  hydrALAZINE (APRESOLINE) injection 10 mg (has no administration in time range)  amLODipine (NORVASC) tablet 10 mg (10 mg Oral Given 12/16/21 1019)  carvedilol (COREG) tablet 3.125 mg (3.125 mg Oral Given 12/16/21 1019)  hydrALAZINE (APRESOLINE) tablet 100 mg  (100 mg Oral Given 12/16/21 1017)    ED Course/ Medical Decision Making/ A&P  Medical Decision Making Patient presenting with increasing weakness and decreased ability to ambulate without significant assistance.  She has significant lab findings today including acute on chronic renal insufficiency, she has a BUN of 74 today, and a creatinine of 3.32.  She is also anemic, although this does appear to be stable, hemoglobin 9.3, normocytic, suggesting anemia of chronic disease.  A PT evaluation has been ordered, per daughter at bedside physical therapist has stated she would benefit from formal PT, awaiting report at this time.  86 year old with increasing weakness, acute on chronic renal failure, no complaint of chest pain or shortness of breath, however with increased peripheral edema, chest x-ray will be obtained to rule out acute CHF.  Patient will benefit from admission secondary to increasing weakness, difficulty ambulation, will probably need rehab for physical therapy.  Amount and/or Complexity of Data Reviewed Labs: ordered. Decision-making details documented in ED Course.    Details: Labs per above. Radiology: ordered.    Details: Chest x-ray, no evidence for fluid overload. ECG/medicine tests: ordered and independent interpretation performed.    Details: Atrial fibrillation, this is not new. Discussion of management or test interpretation with external provider(s): Discussed with Dr. Roger Shelter of the hospitalist service who accepts patient for admission.    Risk Prescription drug management.           Final Clinical Impression(s) / ED Diagnoses Final diagnoses:  Weakness  Acute renal failure superimposed on chronic kidney disease, unspecified CKD stage, unspecified acute renal failure type [facility])    Rx / DC Orders ED Discharge Orders     None         Landis Martins 12/16/21 Nash, [place], MD 12/17/21 [phone]

## 2021-12-16 NOTE — H&P (Signed)
History and Physical   Patient: Chelsea Meza                            PCP: Iona Beard, MD                    DOB: 09-08-1935            DOA: 12/16/2021 NHA:[id number]             DOS: 12/16/2021, 2:06 PM  Iona Beard, MD  Patient coming from:   HOME  I have personally reviewed patient's medical records, in electronic medical records, including:  [place] link, and care everywhere.    Chief Complaint:   Chief Complaint  Patient presents with   Weakness    History of present illness:    Chelsea Meza 86 year old female with history of HTN, HLD, DM 2, CKD IV, CHF, A-fib Chronically anticoagulated on Eliquis Presenting with progressive severe generalized weaknesses at baseline she walks around with a walker now she needs assist with all ADLs including ambulation. For past 2 days unable to stand.  Patient has had recent hospitalization for dehydration and generalized weakness, new onset A-fib..  But upon discharge was able to ambulate with assist and walker.  Patient reports of global generalized weaknesses denies having asymmetric weaknesses.  Denies of having any falls denies any back pain or numbness. History from the chart also reports chronic paraparesis and prior known paresthesia on her left leg.   ED: Blood pressure (!) 187/93, pulse 72, temperature 97.8 F (36.6 C), temperature source Oral, resp. rate 15, height '5\' 9"'  (1.753 m), weight 65.8 kg, SpO2 100 %.    Latest Ref Rng & Units 12/16/2021    9:46 AM 11/10/2021    3:27 PM 10/28/2021    5:11 AM  BMP  Glucose 70 - 99 mg/dL 208  191  53   BUN 8 - 23 mg/dL 74  59  57   Creatinine 0.44 - 1.00 mg/dL 3.32  3.07  3.21   Sodium 135 - 145 mmol/L 140  139  139   Potassium 3.5 - 5.1 mmol/L 3.8  4.5  4.0   Chloride 98 - 111 mmol/L 109  106  109   CO2 22 - 32 mmol/L '23  27  24   ' Calcium 8.9 - 10.3 mg/dL 8.7  8.9  8.8    Upon evaluation patient was unable to stand, but was moving all 4 extremities,  Requested  patient to be admitted for dehydration acute on chronic CKD, severe debility,    Patient Denies having: Fever, Chills, Cough, SOB, Chest Pain, Abd pain, N/V/D, headache, dizziness, lightheadedness,  Dysuria, Joint pain, rash, open wounds, complaining of severe global generalized weakness especially in lower extremities.    Review of Systems: As per HPI, otherwise 10 point review of systems were negative.   ----------------------------------------------------------------------------------------------------------------------  No Known Allergies  Home MEDs:  Prior to Admission medications   Medication Sig Start Date End Date Taking? Authorizing Provider  amLODipine (NORVASC) 10 MG tablet Take 1 tablet (10 mg total) by mouth daily. 10/28/21 12/27/21 Yes Shah, Pratik D, DO  apixaban (ELIQUIS) 2.5 MG TABS tablet Take 1 tablet (2.5 mg total) by mouth 2 (two) times daily. 10/28/21 01/26/22 Yes Shah, Pratik D, DO  atorvastatin (LIPITOR) 40 MG tablet Take 40 mg by mouth at bedtime. 10/19/21  Yes [provider]  carvedilol (COREG) 3.125 MG tablet Take  1 tablet (3.125 mg total) by mouth 2 (two) times daily with a meal. 10/28/21 12/27/21 Yes Shah, Pratik D, DO  Cyanocobalamin (VITAMIN B-12 CR PO) Take 1 tablet by mouth daily.   Yes [provider]  feeding supplement (ENSURE ENLIVE / ENSURE PLUS) LIQD Take 237 mLs by mouth 2 (two) times daily between meals. 10/28/21  Yes Shah, Pratik D, DO  furosemide (LASIX) 40 MG tablet Take 1 tablet (40 mg total) by mouth daily. 02/28/21  Yes Johnson, Clanford L, MD  hydrALAZINE (APRESOLINE) 100 MG tablet Take 1 tablet (100 mg total) by mouth 3 (three) times daily. Patient taking differently: Take 100 mg by mouth 2 (two) times daily. 10/22/18  Yes Tat, Shanon Brow, MD  insulin glargine (LANTUS) 100 UNIT/ML Solostar Pen INJECT 25 UNITS INTO THE SKIN DAILY. Patient taking differently: Inject 25 Units into the skin daily. 06/23/20 12/16/21 Yes Ghimire, Henreitta Leber, MD   Accu-Chek FastClix Lancets MISC  04/09/20   [provider]  ACCU-CHEK GUIDE test strip USE TO TEST TWICE DAILY.AL 07/05/20   [provider]  Blood Glucose Monitoring Suppl (ACCU-CHEK GUIDE) w/Device KIT USE AS DIRECTED.I 09/03/20   [provider]  GVOKE PFS 0.5 MG/0.1ML SOSY Inject 0.5 mg into the skin as needed (low bs). 02/08/21   [provider]  insulin lispro (HUMALOG) 100 UNIT/ML KwikPen Inject 6 Units into the skin in the morning, at noon, and at bedtime. Patient not taking: Reported on 12/16/2021 08/13/21   [provider]  Insulin Pen Needle (PEN NEEDLES) 32G X 4 MM MISC Use as directed 06/23/20   Ghimire, Henreitta Leber, MD  lidocaine (LIDODERM) 5 % Place 1 patch onto the skin daily. Remove & Discard patch within 12 hours or as directed by MD Patient not taking: Reported on 12/16/2021 04/16/21   Evalee Jefferson, PA-C    PRN MEDs: sodium chloride, acetaminophen **OR** acetaminophen, bisacodyl, hydrALAZINE, HYDROmorphone (DILAUDID) injection, ipratropium, levalbuterol, ondansetron **OR** ondansetron (ZOFRAN) IV, oxyCODONE, senna-docusate, sodium chloride flush, traZODone  Past Medical History:  Diagnosis Date   Anemia    CKD (chronic kidney disease) stage 3, GFR 30-59 ml/min ([name])    Diabetes mellitus without complication ([place])    Hyperlipidemia    Hypertension    TIA (transient ischemic attack)     Past Surgical History:  Procedure Laterality Date   CHOLECYSTECTOMY       reports that she has never smoked. She has never used smokeless tobacco. She reports that she does not drink alcohol and does not use drugs.   Family History  Problem Relation Age of Onset   Stroke Mother    Stroke Maternal Grandmother     Physical Exam:   Vitals:   12/16/21 1230 12/16/21 1300 12/16/21 1315 12/16/21 1345  BP: (!) 166/109 (!) 184/94 (!) 187/93 (!) 165/93  Pulse: 77 72 72 76  Resp: '16 20 15 ' (!) 23  Temp:      TempSrc:      SpO2: 100% 99% 100% 100%   Weight:      Height:       Constitutional: NAD, calm, comfortable Psychiatric: Normal judgment and insight. Alert and oriented x 3. Normal mood.  Eyes: PERRL, lids and conjunctivae normal ENMT: Mucous membranes are moist. Posterior pharynx clear of any exudate or lesions.Normal dentition.  Neck: normal, supple, no masses, no thyromegaly Respiratory: clear to auscultation bilaterally, no wheezing, no crackles. Normal respiratory effort. No accessory muscle use.  Cardiovascular: Regular rate and rhythm, no murmurs / rubs /  gallops. No extremity edema. 2+ pedal pulses. No carotid bruits.  Abdomen: no tenderness, no masses palpated. No hepatosplenomegaly. Bowel sounds positive.  Musculoskeletal: Severe global generalized weaknesses, difficulty ambulating  Moving all 4 extremities in bed  no clubbing / cyanosis. No joint deformity upper and lower extremities. no contractures.  Neurologic: CN II-XII grossly intact. Sensation intact, DTR normal. Strength 3/5 lower extremities, 5 out of 5 in upper extremities Skin: no rashes, lesions, ulcers. No induration Decubitus/ulcers:  Wounds: per nursing documentation         Labs on admission:    I have personally reviewed following labs and imaging studies  CBC: Recent Labs  Lab 12/16/21 0946  WBC 6.6  NEUTROABS 4.1  HGB 9.3*  HCT 29.1*  MCV 92.4  PLT 820   Basic Metabolic Panel: Recent Labs  Lab 12/16/21 0946  NA 140  K 3.8  CL 109  CO2 23  GLUCOSE 208*  BUN 74*  CREATININE 3.32*  CALCIUM 8.7*  MG 2.2  PHOS 4.3    Coagulation Profile: Recent Labs  Lab 12/16/21 0946  INR 1.2    CBG: Recent Labs  Lab 12/16/21 1024  GLUCAP 196*    Urine analysis:    Component Value Date/Time   COLORURINE YELLOW 12/16/2021 1121   APPEARANCEUR HAZY (A) 12/16/2021 1121   LABSPEC 1.013 12/16/2021 1121   PHURINE 5.0 12/16/2021 1121   GLUCOSEU 150 (A) 12/16/2021 1121   HGBUR NEGATIVE 12/16/2021 1121   BILIRUBINUR NEGATIVE  12/16/2021 1121   KETONESUR NEGATIVE 12/16/2021 1121   PROTEINUR >=300 (A) 12/16/2021 1121   UROBILINOGEN 0.2 07/13/2012 1236   NITRITE NEGATIVE 12/16/2021 1121   LEUKOCYTESUR SMALL (A) 12/16/2021 1121    Last A1C:  Lab Results  Component Value Date   HGBA1C 6.7 (H) 10/26/2021     Radiologic Exams on Admission:   DG Chest Portable 1 View  Result Date: 12/16/2021 CLINICAL DATA:  Difficult the rising to standing position. EXAM: PORTABLE CHEST 1 VIEW COMPARISON:  11/10/2021 FINDINGS: Lungs are hypoinflated and otherwise clear. Mild stable cardiomegaly. Remainder of the exam is unchanged. IMPRESSION: Hypoinflation without acute cardiopulmonary disease. Electronically Signed   By: Marin Olp M.D.   On: 12/16/2021 13:15    EKG:   Independently reviewed.  Orders placed or performed during the hospital encounter of 12/16/21   ED EKG   ED EKG   EKG 12-Lead   EKG 12-Lead   EKG 12-Lead   ---------------------------------------------------------------------------------------------------------------------------------------    Assessment / Plan:   Principal Problem:   Acute kidney injury superimposed on CKD [facility]) Active Problems:   Debility   Essential hypertension   A-fib ([name])   Hyperglycemia due to diabetes mellitus ([place])   Hyperlipidemia   Hypertensive urgency   Poorly controlled diabetes mellitus ([place])   Seizures ([place])   Lower extremity edema   Assessment and Plan: * Acute kidney injury superimposed on CKD ([place]) CKD stage IV Lab Results  Component Value Date   CREATININE 3.32 (H) 12/16/2021   CREATININE 3.07 (H) 11/10/2021   CREATININE 3.21 (H) 10/28/2021   -Creatinine range since 06/20/2020 == 2.13-3.80 Last creatinine 11/10/2021. Cr  3.07  -Monitor kidney function closely, continue IV fluid hydration -Avoiding nephrotoxins, holding Lasix,  Debility - Severe progressive debility generalized -Progressive getting worse -Needing assist with all ADLs  now -Fall precautions -PT OT consult for evaluation recommendation -TOC consult for possible SNF placement   A-fib ([place]) - Continue Coreg and Eliquis  -2D echocardiogram from 6/29/203 reviewed: IMPRESSIONS Echocardiographic  features concerning for infiltrative disease such as amyloidosis. EJF:  60 to 65%. Severe LVH  Left and Right ventricular diastolic function is normal.   Essential hypertension - In hypertensive state -Resuming home medications: Including Norvasc, Coreg, hydralazine -Holding Lasix due to dehydration, a on CKD  Lower extremity edema - Monitoring, holding Lasix for now -Bilateral compression stockings  Seizures ([name]) - Based on electronic record seizures listed -Patient denies, currently not on any medications  Poorly controlled diabetes mellitus ([place]) - Last A1c 10/26/2021: 6.7 -Continue long-acting insulin, SSI coverage We will titrate insulin for tighter glycemic control  Hypertensive urgency - As needed hydralazine, resuming home medication No focal neurological findings  Hyperlipidemia - Continue atorvastatin   Hyperglycemia due to diabetes mellitus ([place]) - Resuming her long-acting insulin at 25 units nightly -  will check CBG QA CHS with SSI coverage     Consults called:  None -------------------------------------------------------------------------------------------------------------------------------------------- DVT prophylaxis:  TED hose Start: 12/16/21 1324 SCDs Start: 12/16/21 1324 apixaban (ELIQUIS) tablet 2.5 mg Start: 12/16/21 1145 apixaban (ELIQUIS) tablet 2.5 mg   Code Status:   Code Status: Full Code   Admission status: Patient will be admitted as Inpatient, with a greater than 2 midnight length of stay. Level of care: Med-Surg   Family Communication:  none at bedside  (The above findings and plan of care has been discussed with patient in detail, the patient expressed understanding and agreement of above plan)   --------------------------------------------------------------------------------------------------------------------------------------------------  Disposition Plan:  Anticipated 1-2 days Status is: Inpatient Remains inpatient appropriate because: IV fluid hydration, monitoring heart and kidney function closely PT OT evaluation and placement     ----------------------------------------------------------------------------------------------------------------------------------------------------  Time spent: > than  67  Min.   SIGNED: Deatra James, MD, FHM. [facility],  Pager (Please use [email or web address] to page to text)  If 7PM-7AM, please contact night-coverage [email or web address],  12/16/2021, 2:06 PM

## 2021-12-16 NOTE — Assessment & Plan Note (Addendum)
Lab Results  Component Value Date   CREATININE 3.18 (H) 12/17/2021   CREATININE 3.32 (H) 12/16/2021   CREATININE 3.07 (H) 11/10/2021      -Creatinine range since 06/20/2020 == 2.13-3.80 Last creatinine 11/10/2021. Cr  3.07  -Monitor kidney function closely, continue IV fluid hydration -Avoiding nephrotoxins, holding Lasix,

## 2021-12-17 DIAGNOSIS — E11649 Type 2 diabetes mellitus with hypoglycemia without coma: Secondary | ICD-10-CM | POA: Diagnosis not present

## 2021-12-17 DIAGNOSIS — T68XXXA Hypothermia, initial encounter: Secondary | ICD-10-CM | POA: Diagnosis not present

## 2021-12-17 DIAGNOSIS — Z7901 Long term (current) use of anticoagulants: Secondary | ICD-10-CM | POA: Diagnosis not present

## 2021-12-17 DIAGNOSIS — N189 Chronic kidney disease, unspecified: Secondary | ICD-10-CM

## 2021-12-17 DIAGNOSIS — R2689 Other abnormalities of gait and mobility: Secondary | ICD-10-CM | POA: Diagnosis not present

## 2021-12-17 DIAGNOSIS — N179 Acute kidney failure, unspecified: Secondary | ICD-10-CM

## 2021-12-17 DIAGNOSIS — R680 Hypothermia, not associated with low environmental temperature: Secondary | ICD-10-CM | POA: Diagnosis not present

## 2021-12-17 DIAGNOSIS — E876 Hypokalemia: Secondary | ICD-10-CM | POA: Diagnosis not present

## 2021-12-17 DIAGNOSIS — E1122 Type 2 diabetes mellitus with diabetic chronic kidney disease: Secondary | ICD-10-CM | POA: Diagnosis not present

## 2021-12-17 DIAGNOSIS — R600 Localized edema: Secondary | ICD-10-CM | POA: Diagnosis not present

## 2021-12-17 DIAGNOSIS — Z8673 Personal history of transient ischemic attack (TIA), and cerebral infarction without residual deficits: Secondary | ICD-10-CM | POA: Diagnosis not present

## 2021-12-17 DIAGNOSIS — D472 Monoclonal gammopathy: Secondary | ICD-10-CM | POA: Diagnosis not present

## 2021-12-17 DIAGNOSIS — R569 Unspecified convulsions: Secondary | ICD-10-CM | POA: Diagnosis not present

## 2021-12-17 DIAGNOSIS — R609 Edema, unspecified: Secondary | ICD-10-CM | POA: Diagnosis not present

## 2021-12-17 DIAGNOSIS — E0789 Other specified disorders of thyroid: Secondary | ICD-10-CM | POA: Diagnosis not present

## 2021-12-17 DIAGNOSIS — Z79899 Other long term (current) drug therapy: Secondary | ICD-10-CM | POA: Diagnosis not present

## 2021-12-17 DIAGNOSIS — R262 Difficulty in walking, not elsewhere classified: Secondary | ICD-10-CM | POA: Diagnosis not present

## 2021-12-17 DIAGNOSIS — Z7401 Bed confinement status: Secondary | ICD-10-CM | POA: Diagnosis not present

## 2021-12-17 DIAGNOSIS — E079 Disorder of thyroid, unspecified: Secondary | ICD-10-CM | POA: Diagnosis not present

## 2021-12-17 DIAGNOSIS — E161 Other hypoglycemia: Secondary | ICD-10-CM | POA: Diagnosis not present

## 2021-12-17 DIAGNOSIS — Z823 Family history of stroke: Secondary | ICD-10-CM | POA: Diagnosis not present

## 2021-12-17 DIAGNOSIS — S1083XA Contusion of other specified part of neck, initial encounter: Secondary | ICD-10-CM | POA: Diagnosis not present

## 2021-12-17 DIAGNOSIS — T68XXXD Hypothermia, subsequent encounter: Secondary | ICD-10-CM | POA: Diagnosis not present

## 2021-12-17 DIAGNOSIS — I10 Essential (primary) hypertension: Secondary | ICD-10-CM | POA: Diagnosis not present

## 2021-12-17 DIAGNOSIS — R627 Adult failure to thrive: Secondary | ICD-10-CM | POA: Diagnosis not present

## 2021-12-17 DIAGNOSIS — Z992 Dependence on renal dialysis: Secondary | ICD-10-CM | POA: Diagnosis not present

## 2021-12-17 DIAGNOSIS — D631 Anemia in chronic kidney disease: Secondary | ICD-10-CM | POA: Diagnosis not present

## 2021-12-17 DIAGNOSIS — I4891 Unspecified atrial fibrillation: Secondary | ICD-10-CM | POA: Diagnosis not present

## 2021-12-17 DIAGNOSIS — E875 Hyperkalemia: Secondary | ICD-10-CM | POA: Diagnosis not present

## 2021-12-17 DIAGNOSIS — I132 Hypertensive heart and chronic kidney disease with heart failure and with stage 5 chronic kidney disease, or end stage renal disease: Secondary | ICD-10-CM | POA: Diagnosis not present

## 2021-12-17 DIAGNOSIS — Z794 Long term (current) use of insulin: Secondary | ICD-10-CM | POA: Diagnosis not present

## 2021-12-17 DIAGNOSIS — I130 Hypertensive heart and chronic kidney disease with heart failure and stage 1 through stage 4 chronic kidney disease, or unspecified chronic kidney disease: Secondary | ICD-10-CM | POA: Diagnosis not present

## 2021-12-17 DIAGNOSIS — E162 Hypoglycemia, unspecified: Secondary | ICD-10-CM | POA: Diagnosis not present

## 2021-12-17 DIAGNOSIS — I160 Hypertensive urgency: Secondary | ICD-10-CM | POA: Diagnosis not present

## 2021-12-17 DIAGNOSIS — I482 Chronic atrial fibrillation, unspecified: Secondary | ICD-10-CM | POA: Diagnosis not present

## 2021-12-17 DIAGNOSIS — M6281 Muscle weakness (generalized): Secondary | ICD-10-CM | POA: Diagnosis not present

## 2021-12-17 DIAGNOSIS — R6889 Other general symptoms and signs: Secondary | ICD-10-CM | POA: Diagnosis not present

## 2021-12-17 DIAGNOSIS — Z66 Do not resuscitate: Secondary | ICD-10-CM | POA: Diagnosis not present

## 2021-12-17 DIAGNOSIS — I480 Paroxysmal atrial fibrillation: Secondary | ICD-10-CM | POA: Diagnosis not present

## 2021-12-17 DIAGNOSIS — N170 Acute kidney failure with tubular necrosis: Secondary | ICD-10-CM | POA: Diagnosis not present

## 2021-12-17 DIAGNOSIS — E1165 Type 2 diabetes mellitus with hyperglycemia: Secondary | ICD-10-CM | POA: Diagnosis not present

## 2021-12-17 DIAGNOSIS — Z7189 Other specified counseling: Secondary | ICD-10-CM | POA: Diagnosis not present

## 2021-12-17 DIAGNOSIS — E871 Hypo-osmolality and hyponatremia: Secondary | ICD-10-CM | POA: Diagnosis not present

## 2021-12-17 DIAGNOSIS — Y838 Other surgical procedures as the cause of abnormal reaction of the patient, or of later complication, without mention of misadventure at the time of the procedure: Secondary | ICD-10-CM | POA: Diagnosis not present

## 2021-12-17 DIAGNOSIS — E785 Hyperlipidemia, unspecified: Secondary | ICD-10-CM | POA: Diagnosis not present

## 2021-12-17 DIAGNOSIS — R9431 Abnormal electrocardiogram [ECG] [EKG]: Secondary | ICD-10-CM | POA: Diagnosis not present

## 2021-12-17 DIAGNOSIS — R5381 Other malaise: Secondary | ICD-10-CM | POA: Diagnosis not present

## 2021-12-17 DIAGNOSIS — I5032 Chronic diastolic (congestive) heart failure: Secondary | ICD-10-CM | POA: Diagnosis not present

## 2021-12-17 DIAGNOSIS — I82429 Acute embolism and thrombosis of unspecified iliac vein: Secondary | ICD-10-CM | POA: Diagnosis not present

## 2021-12-17 DIAGNOSIS — E869 Volume depletion, unspecified: Secondary | ICD-10-CM | POA: Diagnosis not present

## 2021-12-17 DIAGNOSIS — N186 End stage renal disease: Secondary | ICD-10-CM | POA: Diagnosis not present

## 2021-12-17 DIAGNOSIS — N184 Chronic kidney disease, stage 4 (severe): Secondary | ICD-10-CM | POA: Diagnosis not present

## 2021-12-17 DIAGNOSIS — I509 Heart failure, unspecified: Secondary | ICD-10-CM | POA: Diagnosis not present

## 2021-12-17 DIAGNOSIS — Z743 Need for continuous supervision: Secondary | ICD-10-CM | POA: Diagnosis not present

## 2021-12-17 LAB — URINE CULTURE
Culture: <10000 — AB
Special Requests: NORMAL

## 2021-12-17 LAB — CBC
HCT: 26.6 % — ABNORMAL LOW (ref 36.0–46.0)
Hemoglobin: 8.8 g/dL — ABNORMAL LOW (ref 12.0–15.0)
MCH: 29.6 pg (ref 26.0–34.0)
MCHC: 33.1 g/dL (ref 30.0–36.0)
MCV: 89.6 fL (ref 80.0–100.0)
Platelets: 181 10*3/uL (ref 150–400)
RBC: 2.97 MIL/uL — ABNORMAL LOW (ref 3.87–5.11)
RDW: 13.5 % (ref 11.5–15.5)
WBC: 6.2 10*3/uL (ref 4.0–10.5)
nRBC: 0 % (ref 0.0–0.2)

## 2021-12-17 LAB — BASIC METABOLIC PANEL
Anion gap: 6 (ref 5–15)
BUN: 73 mg/dL — ABNORMAL HIGH (ref 8–23)
CO2: 24 mmol/L (ref 22–32)
Calcium: 8.5 mg/dL — ABNORMAL LOW (ref 8.9–10.3)
Chloride: 110 mmol/L (ref 98–111)
Creatinine, Ser: 3.18 mg/dL — ABNORMAL HIGH (ref 0.44–1.00)
GFR, Estimated: 14 mL/min — ABNORMAL LOW (ref >60)
Glucose, Bld: 152 mg/dL — ABNORMAL HIGH (ref 70–99)
Potassium: 3.7 mmol/L (ref 3.5–5.1)
Sodium: 140 mmol/L (ref 135–145)

## 2021-12-17 LAB — APTT: aPTT: 29 seconds (ref 24–36)

## 2021-12-17 LAB — T3, FREE: T3, Free: 1.4 pg/mL — ABNORMAL LOW (ref 2.0–4.4)

## 2021-12-17 LAB — GLUCOSE, CAPILLARY: Glucose-Capillary: 115 mg/dL — ABNORMAL HIGH (ref 70–99)

## 2021-12-17 NOTE — TOC Transition Note (Signed)
Transition of Care [facility]) - CM/SW Discharge Note   Patient Details  Name: Chelsea Meza MRN: [id number] Date of Birth: 11-Oct-1935  Transition of Care [facility]) CM/SW Contact:  Ihor Gully, LCSW Phone Number: 12/17/2021, 10:23 AM   Clinical Narrative:    Discharge clinicals sent to facility. Daughter notified of discharge. RN to call report. [facility] to provide transport.    Final next level of care: Skilled Nursing Facility Barriers to Discharge: No Barriers Identified   Patient Goals and CMS Choice Patient states their goals for this hospitalization and ongoing recovery are:: rehab      Discharge Placement              Patient chooses bed at: Other - please specify in the comment section below: [facility]) Patient to be transferred to facility by: [place] Name of family member notified: daughter, [name] Patient and family notified of of transfer: 12/17/21  Discharge Plan and Services                                     Social Determinants of Health (SDOH) Interventions     Readmission Risk Interventions    02/24/2021   12:38 PM  Readmission Risk Prevention Plan  Transportation Screening Complete  HRI or Home Care Consult Patient refused  Social Work Consult for [place] Planning/Counseling Complete  Palliative Care Screening Not Applicable  Medication Review [employer]) Complete

## 2021-12-17 NOTE — Care Management Obs Status (Signed)
[place] NOTIFICATION   Patient Details  Name: Chelsea Meza MRN: [id number] Date of Birth: Jun 05, 1935   Medicare Observation Status Notification Given:  Yes    Ihor Gully, LCSW 12/17/2021, 9:27 AM

## 2021-12-17 NOTE — Assessment & Plan Note (Signed)
-   TSH: 6.574 - Free T4: 1.22

## 2021-12-17 NOTE — Care Management CC44 (Signed)
Condition Code 44 Documentation Completed  Patient Details  Name: Chelsea Meza MRN: [id number] Date of Birth: 07-29-1935   Condition Code 44 given:  Yes Patient signature on Condition Code 44 notice:  Yes Documentation of 2 MD's agreement:  Yes Code 44 added to claim:  Yes    Ihor Gully, LCSW 12/17/2021, 9:28 AM

## 2021-12-17 NOTE — Discharge Summary (Addendum)
Physician Discharge Summary [facility]    Patient: Chelsea Meza                   Admit date: 12/16/2021   DOB: Oct 20, 1935             Discharge date:12/17/2021/7:08 AM [id number]                          PCP: Iona Beard, MD  Disposition:     SNF Recommendations for Outpatient Follow-up:   Follow up: with PCP in 1-2 weeks  Follow up: with Cardiology in 1-2 weeks   Discharge Condition: Stable   Code Status:   Code Status: Full Code  Diet recommendation: Diabetic diet   Discharge Diagnoses:    Principal Problem:   Acute kidney injury superimposed on CKD ([place]) Active Problems:   Debility   Essential hypertension   A-fib ([place])   Thyroid dysfunction   Hyperglycemia due to diabetes mellitus ([place])   Hyperlipidemia   Hypertensive urgency   Poorly controlled diabetes mellitus ([name])   Seizures ([name])   Lower extremity edema   History of Present Illness/ Hospital Course Chelsea Meza Summary:    Chelsea Meza 86 year old female with history of HTN, HLD, DM 2, CKD IV, CHF, A-fib Chronically anticoagulated on Eliquis Presenting with progressive severe generalized weaknesses at baseline she walks around with a walker now she needs assist with all ADLs including ambulation. For past 2 days unable to stand.  Patient has had recent hospitalization for dehydration and generalized weakness, new onset A-fib..  But upon discharge was able to ambulate with assist and walker.  Patient reports of global generalized weaknesses denies having asymmetric weaknesses.  Denies of having any falls denies any back pain or numbness. History from the chart also reports chronic paraparesis and prior known paresthesia on her left leg.   ED: Blood pressure (!) 187/93, pulse 72, temperature 97.8 F (36.6 C), temperature source Oral, resp. rate 15, height '5\' 9"'  (1.753 m), weight 65.8 kg, SpO2 100 %.    Latest Ref Rng & Units 12/16/2021    9:46 AM 11/10/2021    3:27 PM 10/28/2021     5:11 AM  BMP  Glucose 70 - 99 mg/dL 208  191  53   BUN 8 - 23 mg/dL 74  59  57   Creatinine 0.44 - 1.00 mg/dL 3.32  3.07  3.21   Sodium 135 - 145 mmol/L 140  139  139   Potassium 3.5 - 5.1 mmol/L 3.8  4.5  4.0   Chloride 98 - 111 mmol/L 109  106  109   CO2 22 - 32 mmol/L '23  27  24   ' Calcium 8.9 - 10.3 mg/dL 8.7  8.9  8.8    Upon evaluation patient was unable to stand, but was moving all 4 extremities,  Requested patient to be admitted for dehydration acute on chronic CKD, severe debility,  Assessment and Plan: * Acute kidney injury superimposed on CKD ([name]) Lab Results  Component Value Date   CREATININE 3.18 (H) 12/17/2021   CREATININE 3.32 (H) 12/16/2021   CREATININE 3.07 (H) 11/10/2021      -Creatinine range since 06/20/2020 == 2.13-3.80 Last creatinine 11/10/2021. Cr  3.07  -Monitor kidney function closely, continue IV fluid hydration -Avoiding nephrotoxins, holding Lasix,  Debility - Severe progressive debility generalized -Progressive getting worse -Needing assist with all ADLs now -Fall precautions -PT OT consulted for evaluation recommended-  SNF -TOC consult for possible SNF placement   Thyroid dysfunction - TSH: 6.574 - Free T4: 1.22   A-fib ([name]) - Continue Coreg and Eliquis  -2D echocardiogram from 6/29/203 reviewed: IMPRESSIONS Echocardiographic features concerning for infiltrative disease such as amyloidosis. EJF:  60 to 65%. Severe LVH  Left and Right ventricular diastolic function is normal.   Essential hypertension - Improved  -Resuming home medications: Including Norvasc, Coreg, hydralazine - Was Holding Lasix due to dehydration, A on CKD - Resuming her Lasix now   Lower extremity edema - Resuming Lasix now  -Bilateral compression stockings  Seizures ([name]) - Based on electronic record seizures listed -Patient denies, currently not on any medications  Poorly controlled diabetes mellitus ([name]) - Last A1c 10/26/2021: 6.7 -Continue  long-acting insulin, SSI coverage -  titrate insulin for tighter glycemic control  Hypertensive urgency - As needed hydralazine, resuming home medication No focal neurological findings  Hyperlipidemia - Continue atorvastatin   Hyperglycemia due to diabetes mellitus ([name]) - Resuming her long-acting insulin at 25 units nightly - CBG QA CHS with SSI coverage      Discharge Instructions:   Discharge Instructions     ([name]) Call MD:  Anytime you have any of the following symptoms: 1) 3 pound weight gain in 24 hours or 5 pounds in 1 week 2) shortness of breath, with or without a dry hacking cough 3) swelling in the hands, feet or stomach 4) if you have to sleep on extra pillows at night in order to breathe.   Complete by: As directed    Activity as tolerated - No restrictions   Complete by: As directed    Call MD for:  difficulty breathing, headache or visual disturbances   Complete by: As directed    Diet - low sodium heart healthy   Complete by: As directed    Discharge instructions   Complete by: As directed    F/UP WITH PCP and cardiology in 1-2 weeks   Increase activity slowly   Complete by: As directed       Allergies as of 12/17/2021   No Known Allergies      Medication List     TAKE these medications    Accu-Chek FastClix Lancets Misc   Accu-Chek Guide test strip Generic drug: glucose blood USE TO TEST TWICE DAILY.AL   Accu-Chek Guide w/Device Kit USE AS DIRECTED.I   amLODipine 10 MG tablet Commonly known as: NORVASC Take 1 tablet (10 mg total) by mouth daily.   apixaban 2.5 MG Tabs tablet Commonly known as: ELIQUIS Take 1 tablet (2.5 mg total) by mouth 2 (two) times daily.   atorvastatin 40 MG tablet Commonly known as: LIPITOR Take 40 mg by mouth at bedtime.   carvedilol 3.125 MG tablet Commonly known as: COREG Take 1 tablet (3.125 mg total) by mouth 2 (two) times daily with a meal.   feeding supplement Liqd Take 237 mLs  by mouth 2 (two) times daily between meals.   furosemide 40 MG tablet Commonly known as: LASIX Take 1 tablet (40 mg total) by mouth daily.   Gvoke PFS 0.5 MG/0.1ML Sosy Generic drug: Glucagon Inject 0.5 mg into the skin as needed (low bs).   hydrALAZINE 100 MG tablet Commonly known as: APRESOLINE Take 1 tablet (100 mg total) by mouth 3 (three) times daily. What changed: when to take this   insulin lispro 100 UNIT/ML KwikPen Commonly known as: HUMALOG Inject 6 Units into the skin in the morning, at  noon, and at bedtime.   Lantus SoloStar 100 UNIT/ML Solostar Pen Generic drug: insulin glargine INJECT 25 UNITS INTO THE SKIN DAILY. What changed:  how much to take how to take this when to take this   lidocaine 5 % Commonly known as: Lidoderm Place 1 patch onto the skin daily. Remove & Discard patch within 12 hours or as directed by MD   Pen Needles 32G X 4 MM Misc Use as directed   VITAMIN B-12 CR PO Take 1 tablet by mouth daily.         No Known Allergies   Quit smoking:  It is highly recommended that all people especially deals with diabetes to quit smoking or stay away from smoking, avoid secondhand smoking.   Vaccines:  Also highly recommended update your vaccines requirement, including SARS-CoV-2 , yearly  flu vaccine and pneumonia vaccine at least every 5 years.      Exercise: If you are able: 30 -60 minutes a day, 4 days a week, or 150 minutes a week.  The longer the better.  Combine stretch, strength, and aerobic activities.  If you were told in the past that you have high risk for cardiovascular diseases, you may seek evaluation by your heart doctor prior to initiating moderate to intense exercise programs.    One other important lifestyle recommendation is to ensure adequate sleep - at least 6-7 hours of uninterrupted sleep at night.  Procedures /Studies:   DG Chest Portable 1 View  Result Date: 12/16/2021 CLINICAL DATA:  Difficult the rising to standing  position. EXAM: PORTABLE CHEST 1 VIEW COMPARISON:  11/10/2021 FINDINGS: Lungs are hypoinflated and otherwise clear. Mild stable cardiomegaly. Remainder of the exam is unchanged. IMPRESSION: Hypoinflation without acute cardiopulmonary disease. Electronically Signed   By: Marin Olp M.D.   On: 12/16/2021 13:15    Subjective:   Patient was seen and examined 12/17/2021, 7:08 AM Patient stable today. No acute distress.  No issues overnight Stable for discharge.  Discharge Exam:    Vitals:   12/16/21 2230 12/17/21 0112 12/17/21 0459 12/17/21 0541  BP: (!) 173/100 (!) 176/95 (!) 180/91 (!) 152/89  Pulse: 70 77  69  Resp: '16 16 16 16  ' Temp:   97.8 F (36.6 C)   TempSrc:   Oral   SpO2: 100% 100% 100% 100%  Weight:   74.5 kg   Height:   '5\' 10"'  (1.778 m)       Physical Exam:   General:  AAO x 3,  cooperative, no distress;   HEENT:  Normocephalic, PERRL, otherwise with in Normal limits   Neuro:  CNII-XII intact. , normal motor and sensation, reflexes intact   Lungs:   Clear to auscultation BL, Respirations unlabored,  No wheezes / crackles  Cardio:    S1/S2, RRR, No murmure, No Rubs or Gallops   Abdomen:  Soft, non-tender, bowel sounds active all four quadrants, no guarding or peritoneal signs.  Muscular  skeletal:  Limited exam -global generalized weaknesses - in bed, able to move all 4 extremities,   2+ pulses,  symmetric, No pitting edema  Skin:  Dry, warm to touch, negative for any Rashes,  Wounds: Please see nursing documentation           The results of significant diagnostics from this hospitalization (including imaging, microbiology, ancillary and laboratory) are listed below for reference.      Microbiology:   No results found for this or any previous visit (from the past 240 hour(s)).  Labs:   CBC: Recent Labs  Lab 12/16/21 0946 12/17/21 0608  WBC 6.6 6.2  NEUTROABS 4.1  --   HGB 9.3* 8.8*  HCT 29.1* 26.6*  MCV 92.4 89.6  PLT 186 829   Basic  Metabolic Panel: Recent Labs  Lab 12/16/21 0946 12/17/21 0608  NA 140 140  K 3.8 3.7  CL 109 110  CO2 23 24  GLUCOSE 208* 152*  BUN 74* 73*  CREATININE 3.32* 3.18*  CALCIUM 8.7* 8.5*  MG 2.2  --   PHOS 4.3  --    Liver Function Tests: No results for input(s): "AST", "ALT", "ALKPHOS", "BILITOT", "PROT", "ALBUMIN" in the last 168 hours. BNP (last 3 results) Recent Labs    12/16/21 1400  BNP 870.0*   Cardiac Enzymes: Recent Labs  Lab 12/16/21 0945  CKTOTAL 83  CKMB 2.4   CBG: Recent Labs  Lab 12/16/21 1024 12/16/21 1649 12/16/21 2228  GLUCAP 196* 209* 263*   Hgb A1c No results for input(s): "HGBA1C" in the last 72 hours. Lipid Profile No results for input(s): "CHOL", "HDL", "LDLCALC", "TRIG", "CHOLHDL", "LDLDIRECT" in the last 72 hours. Thyroid function studies Recent Labs    12/16/21 1401  TSH 6.574*   Anemia work up Recent Labs    12/16/21 1401  VITAMINB12 2,721*   Urinalysis    Component Value Date/Time   COLORURINE YELLOW 12/16/2021 1121   APPEARANCEUR HAZY (A) 12/16/2021 1121   LABSPEC 1.013 12/16/2021 1121   PHURINE 5.0 12/16/2021 1121   GLUCOSEU 150 (A) 12/16/2021 1121   HGBUR NEGATIVE 12/16/2021 1121   BILIRUBINUR NEGATIVE 12/16/2021 1121   KETONESUR NEGATIVE 12/16/2021 1121   PROTEINUR >=300 (A) 12/16/2021 1121   UROBILINOGEN 0.2 07/13/2012 1236   NITRITE NEGATIVE 12/16/2021 1121   LEUKOCYTESUR SMALL (A) 12/16/2021 1121         Time coordinating discharge: Over 45 minutes  SIGNED: Deatra James, MD, FACP, FHM. [facility],  Please use [email or web address] to Page If 7PM-7AM, please contact night-coverage [email or web address],  12/17/2021, 7:08 AM

## 2021-12-19 DIAGNOSIS — R609 Edema, unspecified: Secondary | ICD-10-CM | POA: Diagnosis not present

## 2021-12-19 DIAGNOSIS — E785 Hyperlipidemia, unspecified: Secondary | ICD-10-CM | POA: Diagnosis not present

## 2021-12-19 DIAGNOSIS — R5381 Other malaise: Secondary | ICD-10-CM | POA: Diagnosis not present

## 2021-12-19 DIAGNOSIS — I82429 Acute embolism and thrombosis of unspecified iliac vein: Secondary | ICD-10-CM | POA: Diagnosis not present

## 2021-12-19 DIAGNOSIS — N179 Acute kidney failure, unspecified: Secondary | ICD-10-CM | POA: Diagnosis not present

## 2021-12-19 DIAGNOSIS — I4891 Unspecified atrial fibrillation: Secondary | ICD-10-CM | POA: Diagnosis not present

## 2021-12-19 DIAGNOSIS — I10 Essential (primary) hypertension: Secondary | ICD-10-CM | POA: Diagnosis not present

## 2021-12-19 DIAGNOSIS — E1122 Type 2 diabetes mellitus with diabetic chronic kidney disease: Secondary | ICD-10-CM | POA: Diagnosis not present

## 2021-12-21 ENCOUNTER — Encounter (HOSPITAL_COMMUNITY): Payer: Self-pay | Admitting: *Deleted

## 2021-12-21 ENCOUNTER — Other Ambulatory Visit: Payer: Self-pay

## 2021-12-21 ENCOUNTER — Inpatient Hospital Stay (HOSPITAL_COMMUNITY)
Admission: EM | Admit: 2021-12-21 | Discharge: 2022-01-08 | DRG: 674 | Disposition: A | Discharge status: Skilled Nursing Facility | Payer: Medicare Other | Source: Skilled Nursing Facility | Attending: Family Medicine | Admitting: Family Medicine

## 2021-12-21 DIAGNOSIS — I10 Essential (primary) hypertension: Secondary | ICD-10-CM | POA: Diagnosis present

## 2021-12-21 DIAGNOSIS — E0789 Other specified disorders of thyroid: Secondary | ICD-10-CM | POA: Diagnosis present

## 2021-12-21 DIAGNOSIS — T68XXXA Hypothermia, initial encounter: Secondary | ICD-10-CM | POA: Diagnosis not present

## 2021-12-21 DIAGNOSIS — D649 Anemia, unspecified: Secondary | ICD-10-CM | POA: Diagnosis not present

## 2021-12-21 DIAGNOSIS — I5032 Chronic diastolic (congestive) heart failure: Secondary | ICD-10-CM | POA: Diagnosis present

## 2021-12-21 DIAGNOSIS — E11649 Type 2 diabetes mellitus with hypoglycemia without coma: Secondary | ICD-10-CM | POA: Diagnosis present

## 2021-12-21 DIAGNOSIS — N179 Acute kidney failure, unspecified: Secondary | ICD-10-CM | POA: Diagnosis not present

## 2021-12-21 DIAGNOSIS — Z9049 Acquired absence of other specified parts of digestive tract: Secondary | ICD-10-CM

## 2021-12-21 DIAGNOSIS — R680 Hypothermia, not associated with low environmental temperature: Secondary | ICD-10-CM | POA: Diagnosis present

## 2021-12-21 DIAGNOSIS — Z79899 Other long term (current) drug therapy: Secondary | ICD-10-CM

## 2021-12-21 DIAGNOSIS — N189 Chronic kidney disease, unspecified: Secondary | ICD-10-CM | POA: Diagnosis not present

## 2021-12-21 DIAGNOSIS — E162 Hypoglycemia, unspecified: Principal | ICD-10-CM

## 2021-12-21 DIAGNOSIS — Z7189 Other specified counseling: Secondary | ICD-10-CM | POA: Diagnosis not present

## 2021-12-21 DIAGNOSIS — I482 Chronic atrial fibrillation, unspecified: Secondary | ICD-10-CM | POA: Diagnosis not present

## 2021-12-21 DIAGNOSIS — S1083XA Contusion of other specified part of neck, initial encounter: Secondary | ICD-10-CM | POA: Diagnosis not present

## 2021-12-21 DIAGNOSIS — E785 Hyperlipidemia, unspecified: Secondary | ICD-10-CM | POA: Diagnosis present

## 2021-12-21 DIAGNOSIS — E871 Hypo-osmolality and hyponatremia: Secondary | ICD-10-CM | POA: Diagnosis present

## 2021-12-21 DIAGNOSIS — E876 Hypokalemia: Secondary | ICD-10-CM | POA: Diagnosis not present

## 2021-12-21 DIAGNOSIS — R627 Adult failure to thrive: Secondary | ICD-10-CM | POA: Diagnosis not present

## 2021-12-21 DIAGNOSIS — R0602 Shortness of breath: Secondary | ICD-10-CM | POA: Diagnosis not present

## 2021-12-21 DIAGNOSIS — Z823 Family history of stroke: Secondary | ICD-10-CM

## 2021-12-21 DIAGNOSIS — E161 Other hypoglycemia: Secondary | ICD-10-CM | POA: Diagnosis not present

## 2021-12-21 DIAGNOSIS — Z66 Do not resuscitate: Secondary | ICD-10-CM | POA: Diagnosis not present

## 2021-12-21 DIAGNOSIS — I132 Hypertensive heart and chronic kidney disease with heart failure and with stage 5 chronic kidney disease, or end stage renal disease: Secondary | ICD-10-CM | POA: Diagnosis not present

## 2021-12-21 DIAGNOSIS — D472 Monoclonal gammopathy: Secondary | ICD-10-CM | POA: Diagnosis present

## 2021-12-21 DIAGNOSIS — E875 Hyperkalemia: Secondary | ICD-10-CM | POA: Diagnosis present

## 2021-12-21 DIAGNOSIS — Z743 Need for continuous supervision: Secondary | ICD-10-CM | POA: Diagnosis not present

## 2021-12-21 DIAGNOSIS — E869 Volume depletion, unspecified: Secondary | ICD-10-CM | POA: Diagnosis present

## 2021-12-21 DIAGNOSIS — I509 Heart failure, unspecified: Secondary | ICD-10-CM | POA: Diagnosis not present

## 2021-12-21 DIAGNOSIS — D631 Anemia in chronic kidney disease: Secondary | ICD-10-CM | POA: Diagnosis present

## 2021-12-21 DIAGNOSIS — Z8673 Personal history of transient ischemic attack (TIA), and cerebral infarction without residual deficits: Secondary | ICD-10-CM

## 2021-12-21 DIAGNOSIS — Z794 Long term (current) use of insulin: Secondary | ICD-10-CM

## 2021-12-21 DIAGNOSIS — N170 Acute kidney failure with tubular necrosis: Secondary | ICD-10-CM | POA: Diagnosis not present

## 2021-12-21 DIAGNOSIS — T68XXXD Hypothermia, subsequent encounter: Secondary | ICD-10-CM | POA: Diagnosis not present

## 2021-12-21 DIAGNOSIS — R531 Weakness: Secondary | ICD-10-CM | POA: Diagnosis not present

## 2021-12-21 DIAGNOSIS — R6889 Other general symptoms and signs: Secondary | ICD-10-CM | POA: Diagnosis not present

## 2021-12-21 DIAGNOSIS — R9431 Abnormal electrocardiogram [ECG] [EKG]: Secondary | ICD-10-CM | POA: Diagnosis not present

## 2021-12-21 DIAGNOSIS — Y838 Other surgical procedures as the cause of abnormal reaction of the patient, or of later complication, without mention of misadventure at the time of the procedure: Secondary | ICD-10-CM | POA: Diagnosis not present

## 2021-12-21 DIAGNOSIS — E1122 Type 2 diabetes mellitus with diabetic chronic kidney disease: Secondary | ICD-10-CM | POA: Diagnosis present

## 2021-12-21 DIAGNOSIS — J9811 Atelectasis: Secondary | ICD-10-CM | POA: Diagnosis not present

## 2021-12-21 DIAGNOSIS — I480 Paroxysmal atrial fibrillation: Secondary | ICD-10-CM | POA: Diagnosis present

## 2021-12-21 DIAGNOSIS — I4891 Unspecified atrial fibrillation: Secondary | ICD-10-CM | POA: Diagnosis not present

## 2021-12-21 DIAGNOSIS — Z992 Dependence on renal dialysis: Secondary | ICD-10-CM | POA: Diagnosis not present

## 2021-12-21 DIAGNOSIS — N186 End stage renal disease: Secondary | ICD-10-CM | POA: Diagnosis not present

## 2021-12-21 DIAGNOSIS — I160 Hypertensive urgency: Secondary | ICD-10-CM | POA: Diagnosis not present

## 2021-12-21 DIAGNOSIS — Z7901 Long term (current) use of anticoagulants: Secondary | ICD-10-CM | POA: Diagnosis not present

## 2021-12-21 DIAGNOSIS — E1129 Type 2 diabetes mellitus with other diabetic kidney complication: Secondary | ICD-10-CM | POA: Diagnosis not present

## 2021-12-21 DIAGNOSIS — N3289 Other specified disorders of bladder: Secondary | ICD-10-CM | POA: Diagnosis not present

## 2021-12-21 DIAGNOSIS — R2689 Other abnormalities of gait and mobility: Secondary | ICD-10-CM | POA: Diagnosis not present

## 2021-12-21 DIAGNOSIS — R262 Difficulty in walking, not elsewhere classified: Secondary | ICD-10-CM | POA: Diagnosis not present

## 2021-12-21 DIAGNOSIS — Z8674 Personal history of sudden cardiac arrest: Secondary | ICD-10-CM

## 2021-12-21 DIAGNOSIS — E1165 Type 2 diabetes mellitus with hyperglycemia: Secondary | ICD-10-CM

## 2021-12-21 DIAGNOSIS — N281 Cyst of kidney, acquired: Secondary | ICD-10-CM | POA: Diagnosis not present

## 2021-12-21 DIAGNOSIS — I130 Hypertensive heart and chronic kidney disease with heart failure and stage 1 through stage 4 chronic kidney disease, or unspecified chronic kidney disease: Secondary | ICD-10-CM | POA: Diagnosis not present

## 2021-12-21 DIAGNOSIS — N184 Chronic kidney disease, stage 4 (severe): Secondary | ICD-10-CM | POA: Diagnosis not present

## 2021-12-21 DIAGNOSIS — M6281 Muscle weakness (generalized): Secondary | ICD-10-CM | POA: Diagnosis not present

## 2021-12-21 DIAGNOSIS — R5381 Other malaise: Secondary | ICD-10-CM | POA: Diagnosis not present

## 2021-12-21 HISTORY — DX: Other specified disorders of thyroid: E07.89

## 2021-12-21 HISTORY — DX: Localized edema: R60.0

## 2021-12-21 HISTORY — DX: Unspecified convulsions: R56.9

## 2021-12-21 HISTORY — DX: Chronic atrial fibrillation, unspecified: I48.20

## 2021-12-21 LAB — CBC WITH DIFFERENTIAL/PLATELET
Abs Immature Granulocytes: 0.03 10*3/uL (ref 0.00–0.07)
Basophils Absolute: 0 10*3/uL (ref 0.0–0.1)
Basophils Relative: 0 %
Eosinophils Absolute: 0 10*3/uL (ref 0.0–0.5)
Eosinophils Relative: 0 %
HCT: 33.4 % — ABNORMAL LOW (ref 36.0–46.0)
Hemoglobin: 10.8 g/dL — ABNORMAL LOW (ref 12.0–15.0)
Immature Granulocytes: 1 %
Lymphocytes Relative: 26 %
Lymphs Abs: 1.2 10*3/uL (ref 0.7–4.0)
MCH: 29.6 pg (ref 26.0–34.0)
MCHC: 32.3 g/dL (ref 30.0–36.0)
MCV: 91.5 fL (ref 80.0–100.0)
Monocytes Absolute: 0.2 10*3/uL (ref 0.1–1.0)
Monocytes Relative: 3 %
Neutro Abs: 3.2 10*3/uL (ref 1.7–7.7)
Neutrophils Relative %: 70 %
Platelets: 234 10*3/uL (ref 150–400)
RBC: 3.65 MIL/uL — ABNORMAL LOW (ref 3.87–5.11)
RDW: 13.9 % (ref 11.5–15.5)
WBC: 4.7 10*3/uL (ref 4.0–10.5)
nRBC: 0 % (ref 0.0–0.2)

## 2021-12-21 LAB — COMPREHENSIVE METABOLIC PANEL
ALT: 21 U/L (ref 0–44)
AST: 22 U/L (ref 15–41)
Albumin: 2.9 g/dL — ABNORMAL LOW (ref 3.5–5.0)
Alkaline Phosphatase: 46 U/L (ref 38–126)
Anion gap: 7 (ref 5–15)
BUN: 94 mg/dL — ABNORMAL HIGH (ref 8–23)
CO2: 22 mmol/L (ref 22–32)
Calcium: 8.2 mg/dL — ABNORMAL LOW (ref 8.9–10.3)
Chloride: 105 mmol/L (ref 98–111)
Creatinine, Ser: 4.19 mg/dL — ABNORMAL HIGH (ref 0.44–1.00)
GFR, Estimated: 10 mL/min — ABNORMAL LOW (ref >60)
Glucose, Bld: 64 mg/dL — ABNORMAL LOW (ref 70–99)
Potassium: 4.3 mmol/L (ref 3.5–5.1)
Sodium: 134 mmol/L — ABNORMAL LOW (ref 135–145)
Total Bilirubin: 0.7 mg/dL (ref 0.3–1.2)
Total Protein: 6.5 g/dL (ref 6.5–8.1)

## 2021-12-21 LAB — CORTISOL: Cortisol, Plasma: 16.6 ug/dL

## 2021-12-21 LAB — CBG MONITORING, ED
Glucose-Capillary: 66 mg/dL — ABNORMAL LOW (ref 70–99)
Glucose-Capillary: 97 mg/dL (ref 70–99)
Glucose-Capillary: 187 mg/dL — ABNORMAL HIGH (ref 70–99)
Glucose-Capillary: 146 mg/dL — ABNORMAL HIGH (ref 70–99)

## 2021-12-21 LAB — PROCALCITONIN: Procalcitonin: 0.5 ng/mL

## 2021-12-21 MED ORDER — SODIUM CHLORIDE 0.9 % IV SOLN: INTRAVENOUS | Status: AC

## 2021-12-21 MED ORDER — CARVEDILOL 3.125 MG PO TABS
3.1250 mg | ORAL_TABLET | Freq: Two times a day (BID) | ORAL | Status: DC
  Administered 2021-12-21 – 2022-01-08 (×31): 3.125 mg via ORAL
  Filled 2021-12-21 (×34): qty 1

## 2021-12-21 MED ORDER — AMLODIPINE BESYLATE 5 MG PO TABS
10.0000 mg | ORAL_TABLET | Freq: Every day | ORAL | Status: DC
Start: 2021-12-21 — End: 2021-12-21

## 2021-12-21 MED ORDER — ACETAMINOPHEN 325 MG PO TABS
650.0000 mg | ORAL_TABLET | Freq: Four times a day (QID) | ORAL | Status: DC | PRN
  Administered 2021-12-26 – 2022-01-06 (×4): 650 mg via ORAL
  Filled 2021-12-21 (×4): qty 2

## 2021-12-21 MED ORDER — ACETAMINOPHEN 650 MG RE SUPP: 650.0000 mg | Freq: Four times a day (QID) | RECTAL | Status: DC | PRN

## 2021-12-21 MED ORDER — ATORVASTATIN CALCIUM 40 MG PO TABS
40.0000 mg | ORAL_TABLET | Freq: Every day | ORAL | Status: DC
  Administered 2021-12-21 – 2022-01-07 (×18): 40 mg via ORAL
  Filled 2021-12-21 (×19): qty 1

## 2021-12-21 MED ORDER — INSULIN ASPART 100 UNIT/ML IJ SOLN
0.0000 [IU] | Freq: Three times a day (TID) | INTRAMUSCULAR | Status: DC
  Administered 2021-12-22: 3 [IU] via SUBCUTANEOUS
  Administered 2021-12-22: 2 [IU] via SUBCUTANEOUS
  Administered 2021-12-22: 1 [IU] via SUBCUTANEOUS
  Administered 2021-12-23 – 2021-12-24 (×5): 2 [IU] via SUBCUTANEOUS
  Administered 2021-12-24: 1 [IU] via SUBCUTANEOUS
  Administered 2021-12-25: 2 [IU] via SUBCUTANEOUS
  Administered 2021-12-25 (×2): 1 [IU] via SUBCUTANEOUS
  Administered 2021-12-26 (×2): 2 [IU] via SUBCUTANEOUS
  Administered 2021-12-26 – 2021-12-27 (×2): 1 [IU] via SUBCUTANEOUS
  Administered 2021-12-27: 2 [IU] via SUBCUTANEOUS
  Administered 2021-12-27: 5 [IU] via SUBCUTANEOUS
  Administered 2021-12-28 (×2): 3 [IU] via SUBCUTANEOUS
  Administered 2021-12-28 – 2021-12-29 (×2): 1 [IU] via SUBCUTANEOUS
  Administered 2021-12-29 (×2): 2 [IU] via SUBCUTANEOUS
  Administered 2021-12-30: 1 [IU] via SUBCUTANEOUS
  Administered 2021-12-30: 3 [IU] via SUBCUTANEOUS
  Administered 2021-12-30 – 2021-12-31 (×3): 2 [IU] via SUBCUTANEOUS
  Administered 2021-12-31 – 2022-01-01 (×2): 1 [IU] via SUBCUTANEOUS
  Administered 2022-01-01 – 2022-01-02 (×4): 2 [IU] via SUBCUTANEOUS
  Administered 2022-01-02: 3 [IU] via SUBCUTANEOUS
  Administered 2022-01-03 (×2): 1 [IU] via SUBCUTANEOUS
  Administered 2022-01-04: 2 [IU] via SUBCUTANEOUS
  Administered 2022-01-04: 1 [IU] via SUBCUTANEOUS
  Administered 2022-01-05: 7 [IU] via SUBCUTANEOUS
  Administered 2022-01-05: 1 [IU] via SUBCUTANEOUS
  Administered 2022-01-05 – 2022-01-06 (×2): 3 [IU] via SUBCUTANEOUS
  Administered 2022-01-06: 1 [IU] via SUBCUTANEOUS
  Administered 2022-01-07 (×2): 2 [IU] via SUBCUTANEOUS
  Administered 2022-01-07: 1 [IU] via SUBCUTANEOUS
  Administered 2022-01-08: 3 [IU] via SUBCUTANEOUS
  Administered 2022-01-08: 2 [IU] via SUBCUTANEOUS

## 2021-12-21 MED ORDER — AMLODIPINE BESYLATE 5 MG PO TABS
10.0000 mg | ORAL_TABLET | Freq: Every day | ORAL | Status: DC
  Administered 2021-12-22 – 2022-01-08 (×15): 10 mg via ORAL
  Filled 2021-12-21 (×19): qty 2

## 2021-12-21 MED ORDER — ENSURE ENLIVE PO LIQD
237.0000 mL | Freq: Two times a day (BID) | ORAL | Status: DC
  Administered 2021-12-21 – 2022-01-08 (×30): 237 mL via ORAL
  Filled 2021-12-21 (×3): qty 237

## 2021-12-21 MED ORDER — APIXABAN 2.5 MG PO TABS
2.5000 mg | ORAL_TABLET | Freq: Two times a day (BID) | ORAL | Status: DC
Start: 2021-12-21 — End: 2021-12-30
  Administered 2021-12-21 – 2021-12-29 (×17): 2.5 mg via ORAL
  Filled 2021-12-21 (×18): qty 1

## 2021-12-21 MED ORDER — ONDANSETRON HCL 4 MG/2ML IJ SOLN: 4.0000 mg | Freq: Four times a day (QID) | INTRAMUSCULAR | Status: DC | PRN

## 2021-12-21 MED ORDER — INSULIN ASPART 100 UNIT/ML IJ SOLN
0.0000 [IU] | Freq: Every day | INTRAMUSCULAR | Status: DC
  Administered 2021-12-24 – 2022-01-05 (×9): 2 [IU] via SUBCUTANEOUS
  Filled 2021-12-21: qty 1

## 2021-12-21 MED ORDER — DEXTROSE 50 % IV SOLN
25.0000 mL | Freq: Once | INTRAVENOUS | Status: AC
  Administered 2021-12-21: 25 mL via INTRAVENOUS
  Filled 2021-12-21: qty 50

## 2021-12-21 MED ORDER — ONDANSETRON HCL 4 MG PO TABS: 4.0000 mg | ORAL_TABLET | Freq: Four times a day (QID) | ORAL | Status: DC | PRN

## 2021-12-21 MED ORDER — HYDRALAZINE HCL 25 MG PO TABS
100.0000 mg | ORAL_TABLET | Freq: Two times a day (BID) | ORAL | Status: DC
  Administered 2021-12-21 – 2022-01-08 (×33): 100 mg via ORAL
  Filled 2021-12-21 (×35): qty 4

## 2021-12-21 NOTE — ED Triage Notes (Signed)
Pt brought in by [facility] from [facility] with blood sugar of 12 initially. Pt was given glucagon by nursing home staff and blood sugar went up to 36. Last blood sugar 73 by EMS. Pt alert and oriented at time of arrival to ED.

## 2021-12-21 NOTE — Assessment & Plan Note (Addendum)
Home lasix dose restarted 12/23/21 10/27/2021 echo EF 60 to 65%, no WMA, trivial MR

## 2021-12-21 NOTE — Assessment & Plan Note (Signed)
Continue statin. 

## 2021-12-21 NOTE — Assessment & Plan Note (Addendum)
Suspect variable oral intake in the setting of long-acting Lantus and short acting insulin with worsen renal funciton  Received Humalog 6 units 3 times daily at SNF Holding Lantus NovoLog sliding scale--sensitive scale 10/26/2021 hemoglobin A1c 6.7 No further hypoglycemic episodes

## 2021-12-21 NOTE — Assessment & Plan Note (Addendum)
Secondary to volume depletion and hemodynamic changes Presented with serum creatinine 4.19 Baseline creatinine 3.0-3.3 D/c IVF, pt on hypervolemic side Not much improvement of renal function, likely progression of CKD Renal consult appreciated Discussed with pt and daughter about HD>>noncommital but likely would agree when time arrives or if sense of urgency >>serum creatinine largely stable after starting home lasix, but now slowly rising again

## 2021-12-21 NOTE — Hospital Course (Addendum)
86 year old female with a history of hypertension, hyperlipidemia, diabetes mellitus type 2, CKD stage IV, CHF, atrial fibrillation, hyperlipidemia presenting from [facility] secondary to altered mental status.  The patient was recently hospitalized from 12/16/2021 to 12/17/2021 secondary to acute on chronic renal failure.  She was discharged to [facility] at that time.  She states that she has been asking for bedtime snack around 8 PM the last 2 evenings, but she has not received them.  The patient's daughter called her earlier in the morning on 12/21/2021.  Apparently the patient was confused.  CBG was ultimately checked and she was noted to have a sugar of 12.  The patient was given glucagon by nursing home staff.  Glucose improved to 36.  Upon EMS arrival, her sugar improved to 73.  Nevertheless, the patient was brought to the emergency department for further evaluation and treatment.  The patient denies any fevers, chills, chest pain, shortness breath, cough, hemoptysis, nausea, vomiting, direct abdominal pain, dysuria, hematuria.  She states that her appetite has been pretty good. In the ED, the patient was afebrile and hemodynamically stable with oxygen saturation 100% room air.  BMP showed sodium 134, potassium 4.3, bicarbonate 22, glucose 64, serum creatinine 4.19.  LFTs were unremarkable.  WBC 4.7, hemoglobin 10.8, platelets 234,000.  EKG showed atrial fibrillation with no ST-T wave changes.  The patient was not actually noted to be hypothermic with a temperature 92.7.  She was placed on a warming blanket. Prolonged hospitalization due to slow renal recovery.

## 2021-12-21 NOTE — Assessment & Plan Note (Addendum)
Type unspecified Rate controlled Continue apixaban Continue coreg

## 2021-12-21 NOTE — ED Notes (Signed)
Patient ate 100% of meal

## 2021-12-21 NOTE — ED Notes (Signed)
Warming blanket applied at this time.

## 2021-12-21 NOTE — H&P (Signed)
History and Physical    Patient: Chelsea Meza TDD:[id number] DOB: [birth year] DOA: 12/21/2021 DOS: the patient was seen and examined on 12/21/2021 PCP: Hal Morales, DO  Patient coming from: SNF  Chief Complaint:  Chief Complaint  Patient presents with   Hypoglycemia   HPI: Chelsea Meza is a 86 year old female with a history of hypertension, hyperlipidemia, diabetes mellitus type 2, CKD stage IV, CHF, atrial fibrillation, hyperlipidemia presenting from [facility] secondary to altered mental status.  The patient was recently hospitalized from 12/16/2021 to 12/17/2021 secondary to acute on chronic renal failure.  She was discharged to [facility] at that time.  She states that she has been asking for bedtime snack around 8 PM the last 2 evenings, but she has not received them.  The patient's daughter called her earlier in the morning on 12/21/2021.  Apparently the patient was confused.  CBG was ultimately checked and she was noted to have a sugar of 12.  The patient was given glucagon by nursing home staff.  Glucose improved to 36.  Upon EMS arrival, her sugar improved to 73.  Nevertheless, the patient was brought to the emergency department for further evaluation and treatment.  The patient denies any fevers, chills, chest pain, shortness breath, cough, hemoptysis, nausea, vomiting, direct abdominal pain, dysuria, hematuria.  She states that her appetite has been pretty good. In the ED, the patient was afebrile and hemodynamically stable with oxygen saturation 100% room air.  BMP showed sodium 134, potassium 4.3, bicarbonate 22, glucose 64, serum creatinine 4.19.  LFTs were unremarkable.  WBC 4.7, hemoglobin 10.8, platelets 234,000.  EKG showed atrial fibrillation with no ST-T wave changes.  The patient was not actually noted to be hypothermic with a temperature 92.7.  She was placed on a warming blanket.  Review of Systems: As mentioned in the history of present illness. All  other systems reviewed and are negative. Past Medical History:  Diagnosis Date   Anemia    Chronic atrial fibrillation ([name])    CKD (chronic kidney disease) stage 3, GFR 30-59 ml/min ([name])    Diabetes mellitus without complication ([name])    Hyperlipidemia    Hypertension    Localized edema    Other specified disorders of thyroid    TIA (transient ischemic attack)    Unspecified convulsions ([place])    Past Surgical History:  Procedure Laterality Date   CHOLECYSTECTOMY     Social History:  reports that she has never smoked. She has never used smokeless tobacco. She reports that she does not drink alcohol and does not use drugs.  No Known Allergies  Family History  Problem Relation Age of Onset   Stroke Mother    Stroke Maternal Grandmother     Prior to Admission medications   Medication Sig Start Date End Date Taking? Authorizing Provider  amLODipine (NORVASC) 10 MG tablet Take 1 tablet (10 mg total) by mouth daily. 10/28/21 12/27/21 Yes Shah, Pratik D, DO  apixaban (ELIQUIS) 2.5 MG TABS tablet Take 1 tablet (2.5 mg total) by mouth 2 (two) times daily. 10/28/21 01/26/22 Yes Shah, Pratik D, DO  atorvastatin (LIPITOR) 40 MG tablet Take 40 mg by mouth at bedtime. 10/19/21  Yes [provider]  carvedilol (COREG) 3.125 MG tablet Take 1 tablet (3.125 mg total) by mouth 2 (two) times daily with a meal. 10/28/21 12/27/21 Yes Shah, Pratik D, DO  feeding supplement (ENSURE ENLIVE / ENSURE PLUS) LIQD Take 237 mLs by mouth 2 (two) times daily between  meals. 10/28/21  Yes Manuella Ghazi, Pratik D, DO  furosemide (LASIX) 40 MG tablet Take 1 tablet (40 mg total) by mouth daily. 02/28/21  Yes Johnson, Clanford L, MD  GVOKE PFS 0.5 MG/0.1ML SOSY Inject 0.5 mg into the skin as needed (low bs). 02/08/21  Yes [provider]  hydrALAZINE (APRESOLINE) 100 MG tablet Take 1 tablet (100 mg total) by mouth 3 (three) times daily. Patient taking differently: Take 100 mg by mouth 2 (two) times daily. 10/22/18   Yes Tat, Shanon Brow, MD  insulin glargine (LANTUS) 100 UNIT/ML Solostar Pen INJECT 25 UNITS INTO THE SKIN DAILY. Patient taking differently: Inject 25 Units into the skin daily. 06/23/20 12/21/21 Yes Ghimire, Henreitta Leber, MD  insulin lispro (HUMALOG) 100 UNIT/ML KwikPen Inject 6 Units into the skin in the morning, at noon, and at bedtime. 08/13/21  Yes [provider]  lidocaine (LIDODERM) 5 % Place 1 patch onto the skin daily. Remove & Discard patch within 12 hours or as directed by MD 04/16/21  Yes Idol, Almyra Free, PA-C  Accu-Chek FastClix Lancets MISC  04/09/20   [provider]  ACCU-CHEK GUIDE test strip USE TO TEST TWICE DAILY.AL 07/05/20   [provider]  Blood Glucose Monitoring Suppl (ACCU-CHEK GUIDE) w/Device KIT USE AS DIRECTED.I 09/03/20   [provider]  Cyanocobalamin (VITAMIN B-12 CR PO) Take 1 tablet by mouth daily. Patient not taking: Reported on 12/21/2021    [provider]  Insulin Pen Needle (PEN NEEDLES) 32G X 4 MM MISC Use as directed 06/23/20   Jonetta Osgood, MD    Physical Exam: Vitals:   12/21/21 1142 12/21/21 1300 12/21/21 1330 12/21/21 1406  BP: (!) 143/91 (!) 146/97 (!) 198/97   Pulse: (!) 53 (!) 52 70   Resp: _0 Temp:    (!) 92.7 F (33.7 C)  TempSrc:    Rectal  SpO2: 98% 100% 100%   Weight:      Height:       GENERAL:  A&O x 3, NAD, well developed, cooperative, follows commands HEENT: [place]/AT, No thrush, No icterus, No oral ulcers Neck:  No neck mass, No meningismus, soft, supple CV: RRR, no S3, no S4, no rub, no JVD Lungs:  CTA, no wheeze, no rhonchi, good air movement Abd: soft/NT +BS, nondistended Ext: No edema, no lymphangitis, no cyanosis, no rashes Neuro:  CN II-XII intact, strength 4/5 in RUE, RLE, strength 4/5 LUE, LLE; sensation intact bilateral; no dysmetria; babinski equivocal  Data Reviewed: Data reviewed above in history  Assessment and Plan: * Acute renal failure superimposed on stage 4 chronic  kidney disease, unspecified acute renal failure type ([name]) Secondary to volume depletion Baseline creatinine 3.0-3.3 Start IV fluids Holding Lasix  A-fib ([name]) Type unspecified Rate controlled Continue apixaban  Essential hypertension Continue amlodipine, carvedilol, hydralazine  Hypothermia Presented with temperature 92.7 Check cortisol level 12/16/2021 TSH 6.574, free T4--1.22 Check PCT UA and urine culture Blood culture sent 2 sets  Chronic diastolic CHF (congestive heart failure) ([name]) Clinically euvolemic 10/27/2021 echo EF 60 to 65%, no WMA, trivial MR  Hypoglycemia associated with type 2 diabetes mellitus ([name]) Suspect variable oral intake in the setting of long-acting Lantus and short acting Humalog 6 units 3 times daily Holding Lantus NovoLog sliding scale 10/26/2021 hemoglobin A1c 6.7  Hyperlipidemia Continue statin      Advance Care Planning: FULL  Consults: none  Family Communication: none  Severity of Illness: The appropriate patient status for this patient is INPATIENT.  Inpatient status is judged to be reasonable and necessary in order to provide the required intensity of service to ensure the patient's safety. The patient's presenting symptoms, physical exam findings, and initial radiographic and laboratory data in the context of their chronic comorbidities is felt to place them at high risk for further clinical deterioration. Furthermore, it is not anticipated that the patient will be medically stable for discharge from the hospital within 2 midnights of admission.   * I certify that at the point of admission it is my clinical judgment that the patient will require inpatient hospital care spanning beyond 2 midnights from the point of admission due to high intensity of service, high risk for further deterioration and high frequency of surveillance required.*  Author: Orson Eva, MD 12/21/2021 2:46 PM  For on call review [email or web address].

## 2021-12-21 NOTE — Assessment & Plan Note (Addendum)
Presented with temperature 92.7 Check cortisol level--16.6 12/16/2021 TSH 6.574, free T4--1.22 Check PCT--0.50 Blood culture x2 sets--neg to date -improved

## 2021-12-21 NOTE — Inpatient Diabetes Management (Signed)
Inpatient Diabetes Program Recommendations  AACE/ADA: New Consensus Statement on Inpatient Glycemic Control (2015)  Target Ranges:  Prepandial:   less than 140 mg/dL      Peak postprandial:   less than 180 mg/dL (1-2 hours)      Critically ill patients:  140 - 180 mg/dL   Lab Results  Component Value Date   GLUCAP 187 (H) 12/21/2021   HGBA1C 6.7 (H) 10/26/2021    Review of Glycemic Control  Diabetes history: DM2 Outpatient Diabetes medications: Lantus 25 units qd, Humalog 6 units tid meal coverage Current orders for Inpatient glycemic control: CBGs  Inpatient Diabetes Program Recommendations:   Noted patient arrived  in ED from [facility] today with original CBG 12 and now increased to 187. Patient received Glucagon @ the nursing home prior to transferring to hospital.  Patient was discharged from hospital 12/17/21. Will follow during hospitalization.  Thank you, Nani Gasser. Macgregor Aeschliman, RN, MSN, CDE  Diabetes Coordinator Inpatient Glycemic Control Team Team Pager [phone] (8am-5pm) 12/21/2021 2:20 PM

## 2021-12-21 NOTE — ED Provider Notes (Signed)
[facility] EMERGENCY DEPARTMENT Provider Note   CSN: [id number] Arrival date & time: 12/21/21  1132     History {Add pertinent medical, surgical, social history, OB history to HPI:1} Chief Complaint  Patient presents with   Hypoglycemia    Chelsea Meza is a 86 y.o. female.  Patient with a history of diabetes.  Patient has been lethargic since 6 AM according to the daughter.  Her sugar was checked like around 1130 and it was 12.  She was responded to glucose and is oriented now   Hypoglycemia      Home Medications Prior to Admission medications   Medication Sig Start Date End Date Taking? Authorizing Provider  amLODipine (NORVASC) 10 MG tablet Take 1 tablet (10 mg total) by mouth daily. 10/28/21 12/27/21 Yes Shah, Pratik D, DO  apixaban (ELIQUIS) 2.5 MG TABS tablet Take 1 tablet (2.5 mg total) by mouth 2 (two) times daily. 10/28/21 01/26/22 Yes Shah, Pratik D, DO  atorvastatin (LIPITOR) 40 MG tablet Take 40 mg by mouth at bedtime. 10/19/21  Yes [provider]  carvedilol (COREG) 3.125 MG tablet Take 1 tablet (3.125 mg total) by mouth 2 (two) times daily with a meal. 10/28/21 12/27/21 Yes Shah, Pratik D, DO  feeding supplement (ENSURE ENLIVE / ENSURE PLUS) LIQD Take 237 mLs by mouth 2 (two) times daily between meals. 10/28/21  Yes Shah, Pratik D, DO  furosemide (LASIX) 40 MG tablet Take 1 tablet (40 mg total) by mouth daily. 02/28/21  Yes Johnson, Clanford L, MD  GVOKE PFS 0.5 MG/0.1ML SOSY Inject 0.5 mg into the skin as needed (low bs). 02/08/21  Yes [provider]  hydrALAZINE (APRESOLINE) 100 MG tablet Take 1 tablet (100 mg total) by mouth 3 (three) times daily. Patient taking differently: Take 100 mg by mouth 2 (two) times daily. 10/22/18  Yes Tat, Shanon Brow, MD  insulin glargine (LANTUS) 100 UNIT/ML Solostar Pen INJECT 25 UNITS INTO THE SKIN DAILY. Patient taking differently: Inject 25 Units into the skin daily. 06/23/20 12/21/21 Yes Ghimire, Henreitta Leber, MD   insulin lispro (HUMALOG) 100 UNIT/ML KwikPen Inject 6 Units into the skin in the morning, at noon, and at bedtime. 08/13/21  Yes [provider]  lidocaine (LIDODERM) 5 % Place 1 patch onto the skin daily. Remove & Discard patch within 12 hours or as directed by MD 04/16/21  Yes Idol, Almyra Free, PA-C  Accu-Chek FastClix Lancets MISC  04/09/20   [provider]  ACCU-CHEK GUIDE test strip USE TO TEST TWICE DAILY.AL 07/05/20   [provider]  Blood Glucose Monitoring Suppl (ACCU-CHEK GUIDE) w/Device KIT USE AS DIRECTED.I 09/03/20   [provider]  Cyanocobalamin (VITAMIN B-12 CR PO) Take 1 tablet by mouth daily. Patient not taking: Reported on 12/21/2021    [provider]  Insulin Pen Needle (PEN NEEDLES) 32G X 4 MM MISC Use as directed 06/23/20   Jonetta Osgood, MD      Allergies    Patient has no known allergies.    Review of Systems   Review of Systems  Physical Exam Updated Vital Signs BP (!) 198/97   Pulse 70   Temp (!) 92.7 F (33.7 C) (Rectal)   Resp 17   Ht '5\' 10"'  (1.778 m)   Wt 74.5 kg   SpO2 100%   BMI 23.56 kg/m  Physical Exam  ED Results / Procedures / Treatments   Labs (all labs ordered are listed, but only abnormal results are displayed) Labs Reviewed  CBC WITH DIFFERENTIAL/PLATELET - Abnormal; Notable for the following components:      Result Value   RBC 3.65 (*)    Hemoglobin 10.8 (*)    HCT 33.4 (*)    All other components within normal limits  COMPREHENSIVE METABOLIC PANEL - Abnormal; Notable for the following components:   Sodium 134 (*)    Glucose, Bld 64 (*)    BUN 94 (*)    Creatinine, Ser 4.19 (*)    Calcium 8.2 (*)    Albumin 2.9 (*)    GFR, Estimated 10 (*)    All other components within normal limits  CBG MONITORING, ED - Abnormal; Notable for the following components:   Glucose-Capillary 66 (*)    All other components within normal limits  CBG MONITORING, ED - Abnormal; Notable for the following  components:   Glucose-Capillary 187 (*)    All other components within normal limits  CBG MONITORING, ED    EKG None  Radiology No results found.  Procedures Procedures  {Document cardiac monitor, telemetry assessment procedure when appropriate:1}  Medications Ordered in ED Medications  dextrose 50 % solution 25 mL (25 mLs Intravenous Given 12/21/21 1206)    ED Course/ Medical Decision Making/ A&P                           Medical Decision Making Amount and/or Complexity of Data Reviewed Labs: ordered.  Risk Prescription drug management. Decision regarding hospitalization.   Patient with hypoglycemia and hypothermia.  She will be admitted to medicine  {Document critical care time when appropriate:1} {Document review of labs and clinical decision tools ie heart score, Chads2Vasc2 etc:1}  {Document your independent review of radiology images, and any outside records:1} {Document your discussion with family members, caretakers, and with consultants:1} {Document social determinants of health affecting pt's care:1} {Document your decision making why or why not admission, treatments were needed:1} Final Clinical Impression(s) / ED Diagnoses Final diagnoses:  Hypoglycemia  Hypothermia, initial encounter    Rx / DC Orders ED Discharge Orders     None

## 2021-12-21 NOTE — Assessment & Plan Note (Signed)
Continue amlodipine, carvedilol, hydralazine

## 2021-12-22 DIAGNOSIS — I5032 Chronic diastolic (congestive) heart failure: Secondary | ICD-10-CM | POA: Diagnosis not present

## 2021-12-22 DIAGNOSIS — N179 Acute kidney failure, unspecified: Secondary | ICD-10-CM | POA: Diagnosis not present

## 2021-12-22 DIAGNOSIS — I10 Essential (primary) hypertension: Secondary | ICD-10-CM | POA: Diagnosis not present

## 2021-12-22 DIAGNOSIS — T68XXXD Hypothermia, subsequent encounter: Secondary | ICD-10-CM | POA: Diagnosis not present

## 2021-12-22 LAB — GLUCOSE, CAPILLARY
Glucose-Capillary: 124 mg/dL — ABNORMAL HIGH (ref 70–99)
Glucose-Capillary: 127 mg/dL — ABNORMAL HIGH (ref 70–99)
Glucose-Capillary: 177 mg/dL — ABNORMAL HIGH (ref 70–99)
Glucose-Capillary: 224 mg/dL — ABNORMAL HIGH (ref 70–99)
Glucose-Capillary: 186 mg/dL — ABNORMAL HIGH (ref 70–99)

## 2021-12-22 LAB — BASIC METABOLIC PANEL
Anion gap: 9 (ref 5–15)
BUN: 96 mg/dL — ABNORMAL HIGH (ref 8–23)
CO2: 18 mmol/L — ABNORMAL LOW (ref 22–32)
Calcium: 8 mg/dL — ABNORMAL LOW (ref 8.9–10.3)
Chloride: 107 mmol/L (ref 98–111)
Creatinine, Ser: 4.09 mg/dL — ABNORMAL HIGH (ref 0.44–1.00)
GFR, Estimated: 10 mL/min — ABNORMAL LOW (ref >60)
Glucose, Bld: 121 mg/dL — ABNORMAL HIGH (ref 70–99)
Potassium: 4.9 mmol/L (ref 3.5–5.1)
Sodium: 134 mmol/L — ABNORMAL LOW (ref 135–145)

## 2021-12-22 MED ORDER — SODIUM CHLORIDE 0.9 % IV SOLN: INTRAVENOUS | Status: DC

## 2021-12-22 NOTE — Progress Notes (Signed)
PROGRESS NOTE  Chelsea Meza IRS:[id number] DOB: 10-01-1935 DOA: 12/21/2021 PCP: Hal Morales, DO  Brief History:  86 year old female with a history of hypertension, hyperlipidemia, diabetes mellitus type 2, CKD stage IV, CHF, atrial fibrillation, hyperlipidemia presenting from [facility] secondary to altered mental status.  The patient was recently hospitalized from 12/16/2021 to 12/17/2021 secondary to acute on chronic renal failure.  She was discharged to [facility] at that time.  She states that she has been asking for bedtime snack around 8 PM the last 2 evenings, but she has not received them.  The patient's daughter called her earlier in the morning on 12/21/2021.  Apparently the patient was confused.  CBG was ultimately checked and she was noted to have a sugar of 12.  The patient was given glucagon by nursing home staff.  Glucose improved to 36.  Upon EMS arrival, her sugar improved to 73.  Nevertheless, the patient was brought to the emergency department for further evaluation and treatment.  The patient denies any fevers, chills, chest pain, shortness breath, cough, hemoptysis, nausea, vomiting, direct abdominal pain, dysuria, hematuria.  She states that her appetite has been pretty good. In the ED, the patient was afebrile and hemodynamically stable with oxygen saturation 100% room air.  BMP showed sodium 134, potassium 4.3, bicarbonate 22, glucose 64, serum creatinine 4.19.  LFTs were unremarkable.  WBC 4.7, hemoglobin 10.8, platelets 234,000.  EKG showed atrial fibrillation with no ST-T wave changes.  The patient was not actually noted to be hypothermic with a temperature 92.7.  She was placed on a warming blanket.    Assessment and Plan: * Acute renal failure superimposed on stage 4 chronic kidney disease, unspecified acute renal failure type ([place]) Secondary to volume depletion and hemodynamic changes Presented with serum creatinine 4.19 Baseline creatinine  3.0-3.3 Continue IV another 24 hours Holding Lasix  A-fib ([name]) Type unspecified Rate controlled Continue apixaban  Essential hypertension Continue amlodipine, carvedilol, hydralazine  Hypothermia Presented with temperature 92.7 Check cortisol level--16.6 12/16/2021 TSH 6.574, free T4--1.22 Check PCT--0.50 UA Blood culture x2 sets--neg to date -improved  Chronic diastolic CHF (congestive heart failure) ([name]) Holding lasix another 24 hours 10/27/2021 echo EF 60 to 65%, no WMA, trivial MR  Hypoglycemia associated with type 2 diabetes mellitus ([place]) Suspect variable oral intake in the setting of long-acting Lantus and short acting Received Humalog 6 units 3 times daily at SNF Holding Lantus NovoLog sliding scale--sensitive scale 10/26/2021 hemoglobin A1c 6.7  Hyperlipidemia Continue statin        Family Communication:   Family at bedside  Consultants:  none  Code Status:  FULL   DVT Prophylaxis:  apixabn   Procedures: As Listed in Progress Note Above  Antibiotics: None     Subjective: Patient denies fevers, chills, headache, chest pain, dyspnea, nausea, vomiting, diarrhea, abdominal pain, dysuria, hematuria, hematochezia, and melena.   Objective: Vitals:   12/21/21 2312 12/22/21 0629 12/22/21 0820 12/22/21 1243  BP: (!) 167/84 (!) 159/96 (!) 157/86 133/69  Pulse: 65 75 71 78  Resp: 18 18    Temp: 98.5 F (36.9 C) 97.7 F (36.5 C) (!) 97.5 F (36.4 C) 98.2 F (36.8 C)  TempSrc:   Oral Oral  SpO2: 100% 100% 100% 100%  Weight:      Height:        Intake/Output Summary (Last 24 hours) at 12/22/2021 1701 Last data filed at 12/22/2021 1644 Gross per 24 hour  Intake 2158.02 ml  Output 450 ml  Net 1708.02 ml   Weight change:  Exam:  General:  Pt is alert, follows commands appropriately, not in acute distress HEENT: No icterus, No thrush, No neck mass, [place]/AT Cardiovascular: RRR, S1/S2, no rubs, no gallops Respiratory: fine bibasilar crackles.  No wheeze Abdomen: Soft/+BS, non tender, non distended, no guarding Extremities: 1 + LE edema, No lymphangitis, No petechiae, No rashes, no synovitis   Data Reviewed: I have personally reviewed following labs and imaging studies Basic Metabolic Panel: Recent Labs  Lab 12/16/21 0946 12/17/21 0608 12/21/21 1207 12/22/21 0405  NA 140 140 134* 134*  K 3.8 3.7 4.3 4.9  CL 109 110 105 107  CO2 '23 24 22 '$ 18*  GLUCOSE 208* 152* 64* 121*  BUN 74* 73* 94* 96*  CREATININE 3.32* 3.18* 4.19* 4.09*  CALCIUM 8.7* 8.5* 8.2* 8.0*  MG 2.2  --   --   --   PHOS 4.3  --   --   --    Liver Function Tests: Recent Labs  Lab 12/21/21 1207  AST 22  ALT 21  ALKPHOS 46  BILITOT 0.7  PROT 6.5  ALBUMIN 2.9*   No results for input(s): "LIPASE", "AMYLASE" in the last 168 hours. No results for input(s): "AMMONIA" in the last 168 hours. Coagulation Profile: Recent Labs  Lab 12/16/21 0946  INR 1.2   CBC: Recent Labs  Lab 12/16/21 0946 12/17/21 0608 12/21/21 1207  WBC 6.6 6.2 4.7  NEUTROABS 4.1  --  3.2  HGB 9.3* 8.8* 10.8*  HCT 29.1* 26.6* 33.4*  MCV 92.4 89.6 91.5  PLT 186 181 234   Cardiac Enzymes: Recent Labs  Lab 12/16/21 0945  CKTOTAL 83  CKMB 2.4   BNP: Invalid input(s): "POCBNP" CBG: Recent Labs  Lab 12/21/21 2138 12/21/21 2312 12/22/21 0749 12/22/21 1102 12/22/21 1623  GLUCAP 146* 124* 127* 177* 224*   HbA1C: No results for input(s): "HGBA1C" in the last 72 hours. Urine analysis:    Component Value Date/Time   COLORURINE YELLOW 12/16/2021 1121   APPEARANCEUR HAZY (A) 12/16/2021 1121   LABSPEC 1.013 12/16/2021 1121   PHURINE 5.0 12/16/2021 1121   GLUCOSEU 150 (A) 12/16/2021 1121   HGBUR NEGATIVE 12/16/2021 1121   BILIRUBINUR NEGATIVE 12/16/2021 1121   KETONESUR NEGATIVE 12/16/2021 1121   PROTEINUR >=300 (A) 12/16/2021 1121   UROBILINOGEN 0.2 07/13/2012 1236   NITRITE NEGATIVE 12/16/2021 1121   LEUKOCYTESUR SMALL (A) 12/16/2021 1121   Sepsis  Labs: '@LABRCNTIP'$ (procalcitonin:4,lacticidven:4) ) Recent Results (from the past 240 hour(s))  Urine Culture     Status: Abnormal   Collection Time: 12/16/21 11:57 AM   Specimen: Urine, Clean Catch  Result Value Ref Range Status   Specimen Description   Final    URINE, CLEAN CATCH Performed at [facility], [address] [place], [place] [address]    Special Requests   Final    Normal Performed at [facility], [address] [place], [place] [address]    Culture (A)  Final    <10,000 COLONIES/mL INSIGNIFICANT GROWTH Performed at [facility], [place] [address] [place], [place] [address]    Report Status 12/17/2021 FINAL  Final  Culture, blood (Routine X 2) w Reflex to ID Panel     Status: None (Preliminary result)   Collection Time: 12/21/21  3:06 PM   Specimen: Right Antecubital; Blood  Result Value Ref Range Status   Specimen Description RIGHT ANTECUBITAL  Final   Special Requests  Final    BOTTLES DRAWN AEROBIC AND ANAEROBIC Blood Culture adequate volume   Culture   Final    NO GROWTH < 24 HOURS Performed at [facility], [address], [place], [place] [address]    Report Status PENDING  Incomplete  Culture, blood (Routine X 2) w Reflex to ID Panel     Status: None (Preliminary result)   Collection Time: 12/21/21  3:06 PM   Specimen: BLOOD RIGHT HAND  Result Value Ref Range Status   Specimen Description BLOOD RIGHT HAND  Final   Special Requests   Final    BOTTLES DRAWN AEROBIC ONLY Blood Culture adequate volume   Culture   Final    NO GROWTH < 24 HOURS Performed at [facility], [address], [place], [place] [address]    Report Status PENDING  Incomplete     Scheduled Meds:  amLODipine  10 mg Oral Daily   apixaban  2.5 mg Oral BID   atorvastatin  40 mg Oral QHS   carvedilol  3.125 mg Oral BID WC   feeding supplement  237 mL Oral BID BM   hydrALAZINE  100 mg Oral BID   insulin aspart  0-5 Units Subcutaneous QHS   insulin aspart  0-9 Units  Subcutaneous TID WC   Continuous Infusions:  sodium chloride      Procedures/Studies: DG Chest Portable 1 View  Result Date: 12/16/2021 CLINICAL DATA:  Difficult the rising to standing position. EXAM: PORTABLE CHEST 1 VIEW COMPARISON:  11/10/2021 FINDINGS: Lungs are hypoinflated and otherwise clear. Mild stable cardiomegaly. Remainder of the exam is unchanged. IMPRESSION: Hypoinflation without acute cardiopulmonary disease. Electronically Signed   By: Marin Olp M.D.   On: 12/16/2021 13:15    Orson Eva, DO  [facility]  If 7PM-7AM, please contact night-coverage [email or web address] Password [email or web address] 12/22/2021, 5:01 PM   LOS: 1 day

## 2021-12-22 NOTE — Plan of Care (Signed)
  Problem: Acute Rehab PT Goals(only PT should resolve) Goal: Pt Will Go Supine/Side To Sit Outcome: Progressing Flowsheets (Taken 12/22/2021 1535) Pt will go Supine/Side to Sit:  with minimal assist  with min guard assist Goal: Patient Will Transfer Sit To/From Stand Outcome: Progressing Flowsheets (Taken 12/22/2021 1535) Patient will transfer sit to/from stand: with minimal assist Goal: Pt Will Transfer Bed To Chair/Chair To Bed Outcome: Progressing Flowsheets (Taken 12/22/2021 1535) Pt will Transfer Bed to Chair/Chair to Bed:  min guard assist  with min assist Goal: Pt Will Ambulate Outcome: Progressing Flowsheets (Taken 12/22/2021 1535) Pt will Ambulate:  25 feet  with minimal assist  with moderate assist  with rolling walker   3:36 PM, 12/22/21 [name], MPT Physical Therapist with [facility] [phone] office [phone] mobile phone

## 2021-12-22 NOTE — Progress Notes (Signed)
Patient arrived to the floor, alert and oriented. Snack was given to help maintain adequate blood sugar through out the night.

## 2021-12-22 NOTE — TOC Initial Note (Signed)
Transition of Care [facility]) - Initial/Assessment Note    Patient Details  Name: Chelsea Meza MRN: [id number] Date of Birth: 12-22-1935  Transition of Care [facility]) CM/SW Contact:    Boneta Lucks, RN Phone Number: 12/22/2021, 2:29 PM  Clinical Narrative:        Patient admitted with acute renal failure, recently admitted to [facility]. Plan it to return tomorrow. PT eval ordered and INS AUTH started for DC planning. PT is recommending SNF again.            Expected Discharge Plan: Skilled Nursing Facility Barriers to Discharge: Continued Medical Work up   Patient Goals and CMS Choice Patient states their goals for this hospitalization and ongoing recovery are:: rehab CMS Medicare.gov Compare Post Acute Care list provided to:: Patient Choice offered to / list presented to : Patient  Expected Discharge Plan and Services Expected Discharge Plan: [place]       Prior Living Arrangements/Services   Lives with:: Domestic Partner   Activities of Daily Living Home Assistive Devices/Equipment: Wheelchair ADL Screening (condition at time of admission) Patient's cognitive ability adequate to safely complete daily activities?: Yes Is the patient deaf or have difficulty hearing?: No Does the patient have difficulty seeing, even when wearing glasses/contacts?: No Does the patient have difficulty concentrating, remembering, or making decisions?: No Patient able to express need for assistance with ADLs?: Yes Does the patient have difficulty dressing or bathing?: Yes Independently performs ADLs?: No Communication: Independent Dressing (OT): Needs assistance Is this a change from baseline?: Pre-admission baseline Grooming: Needs assistance Is this a change from baseline?: Pre-admission baseline Feeding: Independent Bathing: Needs assistance Is this a change from baseline?: Pre-admission baseline Toileting: Dependent Is this a change from baseline?: Pre-admission  baseline In/Out Bed: Dependent Is this a change from baseline?: Pre-admission baseline Walks in Home: Needs assistance Is this a change from baseline?: Pre-admission baseline Does the patient have difficulty walking or climbing stairs?: Yes Weakness of Legs: Both Weakness of Arms/Hands: None  Permission Sought/Granted     Emotional Assessment     Affect (typically observed): Accepting        Admission diagnosis:  Hypoglycemia [E16.2] Hypothermia, initial encounter [T68.XXXA] Acute renal failure superimposed on stage 4 chronic kidney disease, unspecified acute renal failure type ([place]) [N17.9, N18.4] Patient Active Problem List   Diagnosis Date Noted   Acute renal failure superimposed on stage 4 chronic kidney disease, unspecified acute renal failure type ([place]) 12/21/2021   Chronic diastolic CHF (congestive heart failure) ([place]) 12/21/2021   Hypothermia 12/21/2021   Thyroid dysfunction 12/17/2021   Debility 12/16/2021    Class: Acute   A-fib ([place]) 12/16/2021    Class: Chronic   Lower extremity edema 12/16/2021   Pressure injury of skin 02/24/2021   Seizures ([place]) 97/67/3419   Acute metabolic encephalopathy 37/90/2409   Acute lower UTI 02/23/2021   Malnutrition of moderate degree 06/23/2020   Poorly controlled diabetes mellitus ([place]) 06/21/2020   Hypoglycemia associated with type 2 diabetes mellitus ([place]) 06/21/2020   Hypertensive urgency 04/05/2020   Cardiac arrest ([place]) 04/05/2020   Acute congestive heart failure ([place]) 10/18/2018   Hyperosmolar non-ketotic state in patient with type 2 diabetes mellitus ([place]) 10/18/2018   Transient cerebral ischemia 11/05/2017   Hyperglycemia due to diabetes mellitus ([place]) 03/20/2015   Hyperlipidemia 03/20/2015   Diabetic hyperosmolar non-ketotic state ([place]) 07/14/2012   DKA, type 2 ([place]) 07/13/2012   Acute kidney injury superimposed on CKD ([place]) 07/13/2012   Paroxysmal supraventricular tachycardia ([place]) 07/13/2012  Paraparesis ([place])  07/13/2012   Paresthesia of left leg 07/13/2012   Essential hypertension 07/13/2012   Paresthesia of skin 07/13/2012   PCP:  Hal Morales, DO Pharmacy:   [place], [address] [place] [address] Phone: [phone] Fax: [phone]  Readmission Risk Interventions    12/22/2021    2:27 PM 02/24/2021   12:38 PM  Readmission Risk Prevention Plan  Transportation Screening Complete Complete  PCP or Specialist Appt within 3-5 Days Complete   HRI or Home Care Consult Complete Patient refused  Social Work Consult for [place] Planning/Counseling Complete Complete  Palliative Care Screening Not Applicable Not Applicable  Medication Review Press photographer) Complete Complete

## 2021-12-22 NOTE — Evaluation (Signed)
Physical Therapy Evaluation Patient Details Name: Chelsea Meza MRN: [id number] DOB: [birth year] Today's Date: 12/22/2021  History of Present Illness  Chelsea Meza is a 86 year old female with a history of hypertension, hyperlipidemia, diabetes mellitus type 2, CKD stage IV, CHF, atrial fibrillation, hyperlipidemia presenting from [facility] secondary to altered mental status.  The patient was recently hospitalized from 12/16/2021 to 12/17/2021 secondary to acute on chronic renal failure.  She was discharged to [facility] at that time.  She states that she has been asking for bedtime snack around 8 PM the last 2 evenings, but she has not received them.  The patient's daughter called her earlier in the morning on 12/21/2021.  Apparently the patient was confused.  CBG was ultimately checked and she was noted to have a sugar of 12.  The patient was given glucagon by nursing home staff.  Glucose improved to 36.  Upon EMS arrival, her sugar improved to 73.  Nevertheless, the patient was brought to the emergency department for further evaluation and treatment.  The patient denies any fevers, chills, chest pain, shortness breath, cough, hemoptysis, nausea, vomiting, direct abdominal pain, dysuria, hematuria.  She states that her appetite has been pretty good.  In the ED, the patient was afebrile and hemodynamically stable with oxygen saturation 100% room air.  BMP showed sodium 134, potassium 4.3, bicarbonate 22, glucose 64, serum creatinine 4.19.  LFTs were unremarkable.  WBC 4.7, hemoglobin 10.8, platelets 234,000.  EKG showed atrial fibrillation with no ST-T wave changes.  The patient was not actually noted to be hypothermic with a temperature 92.7.  She was placed on a warming blanket.   Clinical Impression  Patient demonstrates slow labored movement for sitting up at bedside, required multiple attempts and Mod assist for completing sit to stands due to BLE weakness.  Patient able to take a few  steps forward/backward at bedside before having to sit due to c/o fatigue and generalized weakness.  Patient tolerated sitting up in chair after therapy - nursing staff aware.  Patient will benefit from continued skilled physical therapy in hospital and recommended venue below to increase strength, balance, endurance for safe ADLs and gait.          Recommendations for follow up therapy are one component of a multi-disciplinary discharge planning process, led by the attending physician.  Recommendations may be updated based on patient status, additional functional criteria and insurance authorization.  Follow Up Recommendations Skilled nursing-short term rehab (<3 hours/day) Can patient physically be transported by private vehicle: Yes    Assistance Recommended at Discharge Intermittent Supervision/Assistance  Patient can return home with the following  A lot of help with walking and/or transfers;A little help with bathing/dressing/bathroom;Assistance with cooking/housework;Help with stairs or ramp for entrance    Equipment Recommendations None recommended by PT  Recommendations for Other Services       Functional Status Assessment Patient has had a recent decline in their functional status and demonstrates the ability to make significant improvements in function in a reasonable and predictable amount of time.     Precautions / Restrictions Precautions Precautions: Fall Restrictions Weight Bearing Restrictions: No      Mobility  Bed Mobility Overal bed mobility: Needs Assistance Bed Mobility: Supine to Sit     Supine to sit: Min assist     General bed mobility comments: increased time, labored movement    Transfers Overall transfer level: Needs assistance Equipment used: Rolling walker (2 wheels) Transfers: Sit to/from Stand, Bed to  chair/wheelchair/BSC Sit to Stand: Mod assist   Step pivot transfers: Min assist, Mod assist       General transfer comment: had most  diffiuclty with sit to stands due to BLE weakness    Ambulation/Gait Ambulation/Gait assistance: Mod assist, Min assist Gait Distance (Feet): 10 Feet Assistive device: Rolling walker (2 wheels) Gait Pattern/deviations: Decreased step length - right, Decreased step length - left, Decreased stride length Gait velocity: decreased     General Gait Details: slow labored movement and limited to a few steps forward/backwards at bedside before having to sit due to c/o fatigue  Stairs            Wheelchair Mobility    Modified Rankin (Stroke Patients Only)       Balance Overall balance assessment: Needs assistance Sitting-balance support: Feet supported, No upper extremity supported Sitting balance-Leahy Scale: Fair Sitting balance - Comments: fair/good seated at EOB   Standing balance support: During functional activity, Bilateral upper extremity supported, Reliant on assistive device for balance Standing balance-Leahy Scale: Poor Standing balance comment: fair/poor using RW                             Pertinent Vitals/Pain Pain Assessment Pain Assessment: No/denies pain    Home Living Family/patient expects to be discharged to:: Private residence Living Arrangements: Children Available Help at Discharge: Family;Available PRN/intermittently Type of Home: House Home Access: Stairs to enter Entrance Stairs-Rails: Right Entrance Stairs-Number of Steps: 2   Home Layout: One level Home Equipment: Conservation officer, nature (2 wheels);Cane - single point      Prior Function Prior Level of Function : Needs assist       Physical Assist : Mobility (physical);ADLs (physical)     Mobility Comments: Patient reports ambulating at home independently with RW and intermittent SPC use. Not a community ambulator and currently not driving. ADLs Comments: Patient reports being independent with ADL's with assist from daughter prn.     Hand Dominance   Dominant Hand: Right     Extremity/Trunk Assessment   Upper Extremity Assessment Upper Extremity Assessment: Generalized weakness    Lower Extremity Assessment Lower Extremity Assessment: Generalized weakness    Cervical / Trunk Assessment Cervical / Trunk Assessment: Kyphotic  Communication   Communication: No difficulties  Cognition Arousal/Alertness: Awake/alert Behavior During Therapy: WFL for tasks assessed/performed Overall Cognitive Status: Within Functional Limits for tasks assessed                                          General Comments      Exercises     Assessment/Plan    PT Assessment Patient needs continued PT services  PT Problem List Decreased strength;Decreased activity tolerance;Decreased balance;Decreased mobility       PT Treatment Interventions DME instruction;Gait training;Stair training;Functional mobility training;Therapeutic activities;Therapeutic exercise;Balance training;Patient/family education    PT Goals (Current goals can be found in the Care Plan section)  Acute Rehab PT Goals Patient Stated Goal: return home after rehab PT Goal Formulation: With patient/family Time For Goal Achievement: 01/05/22 Potential to Achieve Goals: Good    Frequency Min 3X/week     Co-evaluation               AM-PAC PT "6 Clicks" Mobility  Outcome Measure Help needed turning from your back to your side while in a flat bed without  using bedrails?: A Little Help needed moving from lying on your back to sitting on the side of a flat bed without using bedrails?: A Little Help needed moving to and from a bed to a chair (including a wheelchair)?: A Little Help needed standing up from a chair using your arms (e.g., wheelchair or bedside chair)?: A Lot Help needed to walk in hospital room?: A Lot Help needed climbing 3-5 steps with a railing? : A Lot 6 Click Score: 15    End of Session   Activity Tolerance: Patient tolerated treatment well;Patient limited by  fatigue Patient left: in chair;with call bell/phone within reach Nurse Communication: Mobility status PT Visit Diagnosis: Unsteadiness on feet (R26.81);Other abnormalities of gait and mobility (R26.89);Muscle weakness (generalized) (M62.81)    Time: 1350-1416 PT Time Calculation (min) (ACUTE ONLY): 26 min   Charges:   PT Evaluation $PT Eval Moderate Complexity: 1 Mod PT Treatments $Therapeutic Activity: 23-37 mins        3:34 PM, 12/22/21 [name], MPT Physical Therapist with [facility] 336 [phone] office [phone] mobile phone

## 2021-12-22 NOTE — TOC Progression Note (Addendum)
  Transition of Care [facility]) Screening Note   Patient Details  Name: Chelsea Meza Date of Birth: [birth year]   Transition of Care [facility]) CM/SW Contact:    Boneta Lucks, RN Phone Number: 12/22/2021, 11:37 AM  DC back to [facility] possibly tomorrow, PT and MD updated will need need INS AUTH to return.   Transition of Care Department [facility]) has reviewed patient and no TOC needs have been identified at this time. We will continue to monitor patient advancement through interdisciplinary progression rounds. If new patient transition needs arise, please place a TOC consult.      Expected Discharge Plan: Long Term Acute Care (LTAC) Barriers to Discharge: Continued Medical Work up  Expected Discharge Plan and Services Expected Discharge Plan: [place] (LTAC)

## 2021-12-22 NOTE — Consult Note (Signed)
   [facility] Inpatient Consult   12/22/2021  SAYDE LISH 07-27-1935 [id number]  [facility] [ACO] Patient: [employer]  Primary Care Provider:  Hal Morales, DO   Patient screened for hospitalization less than 7 days for unplanned readmission risk and to assess for potential [place] Management service needs for post hospital transition.  Review of patient's medical record reveals patient is from a SNF, recently transitioned. Chart reviewed for post hospital transitional needs and is being recommended to return to a skilled nursing level of care.   Plan:  Continue to follow progress and disposition to assess for post hospital care management needs.    For questions contact:   Natividad Brood, RN BSN [facility] Liaison  [phone] business mobile phone Toll free office [phone]  Fax number: [phone] [email or web address]

## 2021-12-23 DIAGNOSIS — I5032 Chronic diastolic (congestive) heart failure: Secondary | ICD-10-CM | POA: Diagnosis not present

## 2021-12-23 DIAGNOSIS — E11649 Type 2 diabetes mellitus with hypoglycemia without coma: Secondary | ICD-10-CM

## 2021-12-23 DIAGNOSIS — N179 Acute kidney failure, unspecified: Secondary | ICD-10-CM | POA: Diagnosis not present

## 2021-12-23 DIAGNOSIS — N184 Chronic kidney disease, stage 4 (severe): Secondary | ICD-10-CM | POA: Diagnosis not present

## 2021-12-23 LAB — RENAL FUNCTION PANEL
Albumin: 2.3 g/dL — ABNORMAL LOW (ref 3.5–5.0)
Anion gap: 6 (ref 5–15)
BUN: 102 mg/dL — ABNORMAL HIGH (ref 8–23)
CO2: 20 mmol/L — ABNORMAL LOW (ref 22–32)
Calcium: 7.7 mg/dL — ABNORMAL LOW (ref 8.9–10.3)
Chloride: 107 mmol/L (ref 98–111)
Creatinine, Ser: 4.1 mg/dL — ABNORMAL HIGH (ref 0.44–1.00)
GFR, Estimated: 10 mL/min — ABNORMAL LOW (ref >60)
Glucose, Bld: 161 mg/dL — ABNORMAL HIGH (ref 70–99)
Phosphorus: 5.9 mg/dL — ABNORMAL HIGH (ref 2.5–4.6)
Potassium: 5.1 mmol/L (ref 3.5–5.1)
Sodium: 133 mmol/L — ABNORMAL LOW (ref 135–145)

## 2021-12-23 LAB — GLUCOSE, CAPILLARY
Glucose-Capillary: 154 mg/dL — ABNORMAL HIGH (ref 70–99)
Glucose-Capillary: 219 mg/dL — ABNORMAL HIGH (ref 70–99)
Glucose-Capillary: 213 mg/dL — ABNORMAL HIGH (ref 70–99)
Glucose-Capillary: 165 mg/dL — ABNORMAL HIGH (ref 70–99)

## 2021-12-23 MED ORDER — FUROSEMIDE 40 MG PO TABS
40.0000 mg | ORAL_TABLET | Freq: Every day | ORAL | Status: DC
  Administered 2021-12-23 – 2021-12-26 (×4): 40 mg via ORAL
  Filled 2021-12-23 (×5): qty 1

## 2021-12-23 NOTE — TOC Progression Note (Signed)
Transition of Care [facility]) - Progression Note    Patient Details  Name: Chelsea Meza MRN: [id number] Date of Birth: 08/09/1935  Transition of Care [facility]) CM/SW Contact  Boneta Lucks, RN Phone Number: 12/23/2021, 11:22 AM  Clinical Narrative:   Insurance authorization started, patient is diuresing, now discharge plan if for Monday. [name] at [facility] updated.     Expected Discharge Plan: Skilled Nursing Facility Barriers to Discharge: Continued Medical Work up  Expected Discharge Plan and Services Expected Discharge Plan: [place]         Readmission Risk Interventions    12/22/2021    2:27 PM 02/24/2021   12:38 PM  Readmission Risk Prevention Plan  Transportation Screening Complete Complete  PCP or Specialist Appt within 3-5 Days Complete   HRI or Home Care Consult Complete Patient refused  Social Work Consult for [place] Planning/Counseling Complete Complete  Palliative Care Screening Not Applicable Not Applicable  Medication Review Press photographer) Complete Complete

## 2021-12-23 NOTE — Care Management Important Message (Signed)
Important Message  Patient Details  Name: Chelsea Meza MRN: [id number] Date of Birth: [birth year]   Medicare Important Message Given:  Yes     Tommy Medal 12/23/2021, 10:42 AM

## 2021-12-23 NOTE — Consult Note (Signed)
[facility]  INPATIENT CONSULTATION  Reason for Consultation: AKI on CKD Requesting Provider: Dr Carles Collet  HPI: Chelsea Meza is an 86 y.o. female with DM 2, HTN, HL, CHF, A fib, CKD 4 who is currently admitted for AMS/hypoglycemia and nephrology is consulted re: evaluation and management of AKI on CKD and recommendations regarding diuresis.   She presented 8/23 from [facility] SNF with AMS in setting of BG 12 after missing bedside snack night before.  ED initially T 92.7 but hemodynamically stable.  Labs showing Cr 4.2, lytes ok, Hb 10.8.  She was admitted, lasix currently on hold and she's on NS 75/hr.  She says this morning she's feeling fine.  Great appetite.  Living at SNF and doing PT - was able to ambulate with RW with PT this AM. Unclear if goal is d/c home (lives with daughter) or if longterm SNF.   She is aware of CKD but hasn't seen Dr. Theador Hawthorne since 12/2019.  No knowledge of dialysis.  Admitted 8/18-8/19 [facility] for weakness -  Cr 3.1-3.2. Rec'd some hydration and DC to SNF. Admitted [facility] 6/28-30 AMS, weakness, new A fib, HTN: Cr 3.8 > 3.2. TTE EF 60-65%, severe LVH, DD couldn't be estimated. Baseline kidney function 01/2021 CR 2.4, 03/2021 Cr 2.4  Last renal US 03/2020- normal size, no obstruction, echogenic. UAs historically have 3+ protein, no blood. No MACR.  PMH: Past Medical History:  Diagnosis Date   Anemia    Chronic atrial fibrillation ([name])    CKD (chronic kidney disease) stage 3, GFR 30-59 ml/min ([name])    Diabetes mellitus without complication ([name])    Hyperlipidemia    Hypertension    Localized edema    Other specified disorders of thyroid    TIA (transient ischemic attack)    Unspecified convulsions ([name])    PSH: Past Surgical History:  Procedure Laterality Date   CHOLECYSTECTOMY      Past Medical History:  Diagnosis Date   Anemia    Chronic atrial fibrillation ([name])    CKD (chronic kidney disease) stage 3, GFR 30-59 ml/min ([name])     Diabetes mellitus without complication ([name])    Hyperlipidemia    Hypertension    Localized edema    Other specified disorders of thyroid    TIA (transient ischemic attack)    Unspecified convulsions ([name])     Medications:  I have reviewed the patient's current medications.  Medications Prior to Admission  Medication Sig Dispense Refill   amLODipine (NORVASC) 10 MG tablet Take 1 tablet (10 mg total) by mouth daily. 30 tablet 1   apixaban (ELIQUIS) 2.5 MG TABS tablet Take 1 tablet (2.5 mg total) by mouth 2 (two) times daily. 180 tablet 0   atorvastatin (LIPITOR) 40 MG tablet Take 40 mg by mouth at bedtime.     carvedilol (COREG) 3.125 MG tablet Take 1 tablet (3.125 mg total) by mouth 2 (two) times daily with a meal. 60 tablet 1   feeding supplement (ENSURE ENLIVE / ENSURE PLUS) LIQD Take 237 mLs by mouth 2 (two) times daily between meals. 237 mL 12   furosemide (LASIX) 40 MG tablet Take 1 tablet (40 mg total) by mouth daily. 30 tablet    GVOKE PFS 0.5 MG/0.1ML SOSY Inject 0.5 mg into the skin as needed (low bs).     hydrALAZINE (APRESOLINE) 100 MG tablet Take 1 tablet (100 mg total) by mouth 3 (three) times daily. (Patient taking differently: Take 100 mg by mouth 2 (two)  times daily.) 90 tablet 1   insulin glargine (LANTUS) 100 UNIT/ML Solostar Pen INJECT 25 UNITS INTO THE SKIN DAILY. (Patient taking differently: Inject 25 Units into the skin daily.) 15 mL 11   insulin lispro (HUMALOG) 100 UNIT/ML KwikPen Inject 6 Units into the skin in the morning, at noon, and at bedtime.     lidocaine (LIDODERM) 5 % Place 1 patch onto the skin daily. Remove & Discard patch within 12 hours or as directed by MD 30 patch 0   Accu-Chek FastClix Lancets MISC      ACCU-CHEK GUIDE test strip USE TO TEST TWICE DAILY.AL     Blood Glucose Monitoring Suppl (ACCU-CHEK GUIDE) w/Device KIT USE AS DIRECTED.I     Cyanocobalamin (VITAMIN B-12 CR PO) Take 1 tablet by mouth daily. (Patient not taking: Reported on  12/21/2021)     Insulin Pen Needle (PEN NEEDLES) 32G X 4 MM MISC Use as directed 100 each 11    ALLERGIES:  No Known Allergies  FAM HX: Family History  Problem Relation Age of Onset   Stroke Mother    Stroke Maternal Grandmother     Social History:   reports that she has never smoked. She has never used smokeless tobacco. She reports that she does not drink alcohol and does not use drugs.  ROS: 12 system ROS neg except per HPI above  Blood pressure (!) 157/79, pulse 70, temperature 98 F (36.7 C), temperature source Oral, resp. rate 18, height _0  (1.778 m), weight 74.5 kg, SpO2 100 %. PHYSICAL EXAM: Gen: elderly woman lying flat in bed eating peanuts  Eyes: anicteri c ENT: MMM Neck: supple CV:  RRR, III/VI SEM, no rub Abd:  soft Back: clear lungs GU: no foley Extr:  1+ tibial edema Neuro: conversant, no asterixis    Results for orders placed or performed during the hospital encounter of 12/21/21 (from the past 48 hour(s))  CBG monitoring, ED     Status: Abnormal   Collection Time: 12/21/21 11:50 AM  Result Value Ref Range   Glucose-Capillary 66 (L) 70 - 99 mg/dL    Comment: Glucose reference range applies only to samples taken after fasting for at least 8 hours.  CBC with Differential     Status: Abnormal   Collection Time: 12/21/21 12:07 PM  Result Value Ref Range   WBC 4.7 4.0 - 10.5 K/uL   RBC 3.65 (L) 3.87 - 5.11 MIL/uL   Hemoglobin 10.8 (L) 12.0 - 15.0 g/dL   HCT 33.4 (L) 36.0 - 46.0 %   MCV 91.5 80.0 - 100.0 fL   MCH 29.6 26.0 - 34.0 pg   MCHC 32.3 30.0 - 36.0 g/dL   RDW 13.9 11.5 - 15.5 %   Platelets 234 150 - 400 K/uL   nRBC 0.0 0.0 - 0.2 %   Neutrophils Relative % 70 %   Neutro Abs 3.2 1.7 - 7.7 K/uL   Lymphocytes Relative 26 %   Lymphs Abs 1.2 0.7 - 4.0 K/uL   Monocytes Relative 3 %   Monocytes Absolute 0.2 0.1 - 1.0 K/uL   Eosinophils Relative 0 %   Eosinophils Absolute 0.0 0.0 - 0.5 K/uL   Basophils Relative 0 %   Basophils Absolute 0.0 0.0  - 0.1 K/uL   Immature Granulocytes 1 %   Abs Immature Granulocytes 0.03 0.00 - 0.07 K/uL    Comment: Performed at [facility], [address], [place], [place] [address]  Comprehensive metabolic panel     Status:  Abnormal   Collection Time: 12/21/21 12:07 PM  Result Value Ref Range   Sodium 134 (L) 135 - 145 mmol/L   Potassium 4.3 3.5 - 5.1 mmol/L   Chloride 105 98 - 111 mmol/L   CO2 22 22 - 32 mmol/L   Glucose, Bld 64 (L) 70 - 99 mg/dL    Comment: Glucose reference range applies only to samples taken after fasting for at least 8 hours.   BUN 94 (H) 8 - 23 mg/dL   Creatinine, Ser 4.19 (H) 0.44 - 1.00 mg/dL   Calcium 8.2 (L) 8.9 - 10.3 mg/dL   Total Protein 6.5 6.5 - 8.1 g/dL   Albumin 2.9 (L) 3.5 - 5.0 g/dL   AST 22 15 - 41 U/L   ALT 21 0 - 44 U/L   Alkaline Phosphatase 46 38 - 126 U/L   Total Bilirubin 0.7 0.3 - 1.2 mg/dL   GFR, Estimated 10 (L) >60 mL/min    Comment: (NOTE) Calculated using the CKD-EPI Creatinine Equation (2021)    Anion gap 7 5 - 15    Comment: Performed at [facility], [address], [place], [place] [address]  CBG monitoring, ED     Status: None   Collection Time: 12/21/21 12:27 PM  Result Value Ref Range   Glucose-Capillary 97 70 - 99 mg/dL    Comment: Glucose reference range applies only to samples taken after fasting for at least 8 hours.  CBG monitoring, ED     Status: Abnormal   Collection Time: 12/21/21  1:49 PM  Result Value Ref Range   Glucose-Capillary 187 (H) 70 - 99 mg/dL    Comment: Glucose reference range applies only to samples taken after fasting for at least 8 hours.  Cortisol     Status: None   Collection Time: 12/21/21  3:06 PM  Result Value Ref Range   Cortisol, Plasma 16.6 ug/dL    Comment: (NOTE) AM    6.7 - 22.6 ug/dL PM   <10.0       ug/dL Performed at [facility] [address] [place], [place] [address]   Culture, blood (Routine X 2) w Reflex to ID Panel     Status: None (Preliminary result)   Collection Time:  12/21/21  3:06 PM   Specimen: Right Antecubital; Blood  Result Value Ref Range   Specimen Description RIGHT ANTECUBITAL    Special Requests      BOTTLES DRAWN AEROBIC AND ANAEROBIC Blood Culture adequate volume   Culture      NO GROWTH 2 DAYS Performed at [facility], [address], [place], [place] [address]    Report Status PENDING   Culture, blood (Routine X 2) w Reflex to ID Panel     Status: None (Preliminary result)   Collection Time: 12/21/21  3:06 PM   Specimen: BLOOD RIGHT HAND  Result Value Ref Range   Specimen Description BLOOD RIGHT HAND    Special Requests      BOTTLES DRAWN AEROBIC ONLY Blood Culture adequate volume   Culture      NO GROWTH 2 DAYS Performed at [facility], [address], [place], [place] [address]    Report Status PENDING   Procalcitonin - Baseline     Status: None   Collection Time: 12/21/21  3:06 PM  Result Value Ref Range   Procalcitonin 0.50 ng/mL    Comment:        Interpretation: PCT (Procalcitonin) <= 0.5 ng/mL: Systemic infection (sepsis) is  not likely. Local bacterial infection is possible. (NOTE)       Sepsis PCT Algorithm           Lower Respiratory Tract                                      Infection PCT Algorithm    ----------------------------     ----------------------------         PCT < 0.25 ng/mL                PCT < 0.10 ng/mL          Strongly encourage             Strongly discourage   discontinuation of antibiotics    initiation of antibiotics    ----------------------------     -----------------------------       PCT 0.25 - 0.50 ng/mL            PCT 0.10 - 0.25 ng/mL               OR       >80% decrease in PCT            Discourage initiation of                                            antibiotics      Encourage discontinuation           of antibiotics    ----------------------------     -----------------------------         PCT >= 0.50 ng/mL              PCT 0.26 - 0.50 ng/mL               AND        <80%  decrease in PCT             Encourage initiation of                                             antibiotics       Encourage continuation           of antibiotics    ----------------------------     -----------------------------        PCT >= 0.50 ng/mL                  PCT > 0.50 ng/mL               AND         increase in PCT                  Strongly encourage                                      initiation of antibiotics    Strongly encourage escalation           of antibiotics                                     -----------------------------  PCT <= 0.25 ng/mL                                                 OR                                        > 80% decrease in PCT                                      Discontinue / Do not initiate                                             antibiotics  Performed at [facility], [address] [place], [place] [address]   CBG monitoring, ED     Status: Abnormal   Collection Time: 12/21/21  9:38 PM  Result Value Ref Range   Glucose-Capillary 146 (H) 70 - 99 mg/dL    Comment: Glucose reference range applies only to samples taken after fasting for at least 8 hours.  Glucose, capillary     Status: Abnormal   Collection Time: 12/21/21 11:12 PM  Result Value Ref Range   Glucose-Capillary 124 (H) 70 - 99 mg/dL    Comment: Glucose reference range applies only to samples taken after fasting for at least 8 hours.  Basic metabolic panel     Status: Abnormal   Collection Time: 12/22/21  4:05 AM  Result Value Ref Range   Sodium 134 (L) 135 - 145 mmol/L   Potassium 4.9 3.5 - 5.1 mmol/L   Chloride 107 98 - 111 mmol/L   CO2 18 (L) 22 - 32 mmol/L   Glucose, Bld 121 (H) 70 - 99 mg/dL    Comment: Glucose reference range applies only to samples taken after fasting for at least 8 hours.   BUN 96 (H) 8 - 23 mg/dL   Creatinine, Ser 4.09 (H) 0.44 - 1.00 mg/dL   Calcium 8.0 (L) 8.9 - 10.3 mg/dL   GFR, Estimated 10  (L) >60 mL/min    Comment: (NOTE) Calculated using the CKD-EPI Creatinine Equation (2021)    Anion gap 9 5 - 15    Comment: Performed at [facility], [address], [place],  [address]  Glucose, capillary     Status: Abnormal   Collection Time: 12/22/21  7:49 AM  Result Value Ref Range   Glucose-Capillary 127 (H) 70 - 99 mg/dL    Comment: Glucose reference range applies only to samples taken after fasting for at least 8 hours.  Glucose, capillary     Status: Abnormal   Collection Time: 12/22/21 11:02 AM  Result Value Ref Range   Glucose-Capillary 177 (H) 70 - 99 mg/dL    Comment: Glucose reference range applies only to samples taken after fasting for at least 8 hours.  Glucose, capillary     Status: Abnormal   Collection Time: 12/22/21  4:23 PM  Result Value Ref Range   Glucose-Capillary 224 (H) 70 - 99 mg/dL    Comment: Glucose reference range applies only to samples taken after fasting  for at least 8 hours.   Comment 1 Notify RN    Comment 2 Document in Chart   Glucose, capillary     Status: Abnormal   Collection Time: 12/22/21  9:14 PM  Result Value Ref Range   Glucose-Capillary 186 (H) 70 - 99 mg/dL    Comment: Glucose reference range applies only to samples taken after fasting for at least 8 hours.   Comment 1 Notify RN    Comment 2 Document in Chart   Renal function panel     Status: Abnormal   Collection Time: 12/23/21  3:23 AM  Result Value Ref Range   Sodium 133 (L) 135 - 145 mmol/L   Potassium 5.1 3.5 - 5.1 mmol/L   Chloride 107 98 - 111 mmol/L   CO2 20 (L) 22 - 32 mmol/L   Glucose, Bld 161 (H) 70 - 99 mg/dL    Comment: Glucose reference range applies only to samples taken after fasting for at least 8 hours.   BUN 102 (H) 8 - 23 mg/dL    Comment: RESULTS CONFIRMED BY MANUAL DILUTION   Creatinine, Ser 4.10 (H) 0.44 - 1.00 mg/dL   Calcium 7.7 (L) 8.9 - 10.3 mg/dL   Phosphorus 5.9 (H) 2.5 - 4.6 mg/dL   Albumin 2.3 (L) 3.5 - 5.0 g/dL   GFR, Estimated 10 (L)  >60 mL/min    Comment: (NOTE) Calculated using the CKD-EPI Creatinine Equation (2021)    Anion gap 6 5 - 15    Comment: Performed at [facility], [address] [place], [place] [address]  Glucose, capillary     Status: Abnormal   Collection Time: 12/23/21  7:54 AM  Result Value Ref Range   Glucose-Capillary 154 (H) 70 - 99 mg/dL    Comment: Glucose reference range applies only to samples taken after fasting for at least 8 hours.    No results found.  Assessment/Plan **AKI on CKD 4:  Longstanding CKD presumed secondary to HTN and DM.  Having some progression in setting of poorly controlled HTN and DM.  No uremic s/s but BUN currently in the low 100s.  Mildly hypervolemic - d/c IVF and resume outpt dose of lasix. Avoid nephrotoxins and hypotension.  Follow strict I/Os.  Discussed dialysis with her today - asked her to d/w daughter and think of wishes.  She seems to be a marginal candidate but if her functional status is improving I think we could try if she wishes.    **Hyponatremia, hypervolemic:  d/c IVF and resume oral lasix.  Low Na diet, I/Os.   **HTN:  Bp somewhat variable, but many values in 130s - maintain current meds and gently diurese.  Avoid marked drops in BP.   **DM:  h/o poor control with A1cs generally > 13 but 6/28 A1c 6.7 in recent hypoglycemia.  Per primary.   **Debility: nutrition, PT/OT, SNF resident.   **Anemia:  Hb in the 10s.  Does not appear to be on ESA.   **HFpEF:  per above d/c IV and resume outpt lasix.   **A fib:  on eliquis and BB.   Will follow but will not plan to see over the weekend.  Please reach out with concerns.   If she is ready for discharge over the weekend she should have follow up with nephrology in 2-4 weeks after discharge.  Either with Dr Theador Hawthorne where she followed before or with [facility].   Justin Mend 12/23/2021, 10:14 AM

## 2021-12-23 NOTE — Progress Notes (Signed)
PROGRESS NOTE  Chelsea Meza:[id number] DOB: [birth year] DOA: 12/21/2021 PCP: Hal Morales, DO  Brief History:  86 year old female with a history of hypertension, hyperlipidemia, diabetes mellitus type 2, CKD stage IV, CHF, atrial fibrillation, hyperlipidemia presenting from [facility] secondary to altered mental status.  The patient was recently hospitalized from 12/16/2021 to 12/17/2021 secondary to acute on chronic renal failure.  She was discharged to [facility] at that time.  She states that she has been asking for bedtime snack around 8 PM the last 2 evenings, but she has not received them.  The patient's daughter called her earlier in the morning on 12/21/2021.  Apparently the patient was confused.  CBG was ultimately checked and she was noted to have a sugar of 12.  The patient was given glucagon by nursing home staff.  Glucose improved to 36.  Upon EMS arrival, her sugar improved to 73.  Nevertheless, the patient was brought to the emergency department for further evaluation and treatment.  The patient denies any fevers, chills, chest pain, shortness breath, cough, hemoptysis, nausea, vomiting, direct abdominal pain, dysuria, hematuria.  She states that her appetite has been pretty good. In the ED, the patient was afebrile and hemodynamically stable with oxygen saturation 100% room air.  BMP showed sodium 134, potassium 4.3, bicarbonate 22, glucose 64, serum creatinine 4.19.  LFTs were unremarkable.  WBC 4.7, hemoglobin 10.8, platelets 234,000.  EKG showed atrial fibrillation with no ST-T wave changes.  The patient was not actually noted to be hypothermic with a temperature 92.7.  She was placed on a warming blanket.     Assessment and Plan: * Acute renal failure superimposed on stage 4 chronic kidney disease, unspecified acute renal failure type ([place]) Secondary to volume depletion and hemodynamic changes Presented with serum creatinine 4.19 Baseline creatinine  3.0-3.3 D/c IVF, pt on hypervolemic side Not much improvement of renal function, likely progression of CKD Renal consult appreciated  A-fib ([name]) Type unspecified Rate controlled Continue apixaban  Essential hypertension Continue amlodipine, carvedilol, hydralazine  Hypothermia Presented with temperature 92.7 Check cortisol level--16.6 12/16/2021 TSH 6.574, free T4--1.22 Check PCT--0.50 Blood culture x2 sets--neg to date -improved  Chronic diastolic CHF (congestive heart failure) ([name]) Holding lasix another 24 hours 10/27/2021 echo EF 60 to 65%, no WMA, trivial MR  Hypoglycemia associated with type 2 diabetes mellitus ([place]) Suspect variable oral intake in the setting of long-acting Lantus and short acting Received Humalog 6 units 3 times daily at SNF Holding Lantus NovoLog sliding scale--sensitive scale 10/26/2021 hemoglobin A1c 6.7  Hyperlipidemia Continue statin   Family Communication:   Family at bedside   Consultants:  none   Code Status:  FULL    DVT Prophylaxis:  apixabn     Procedures: As Listed in Progress Note Above   Antibiotics: None         Subjective: Patient denies fevers, chills, headache, chest pain, dyspnea, nausea, vomiting, diarrhea, abdominal pain, dysuria, hematuria, hematochezia, and melena.   Objective: Vitals:   12/22/21 2112 12/23/21 0528 12/23/21 1446 12/23/21 1714  BP: (!) 143/79 (!) 157/79 (!) 151/101 (!) 166/97  Pulse: 78 70 73 87  Resp: '20 18 20   '$ Temp: 98.5 F (36.9 C) 98 F (36.7 C) 97.8 F (36.6 C)   TempSrc: Oral Oral    SpO2: 100% 100% 100%   Weight:      Height:        Intake/Output Summary (Last 24 hours)  at 12/23/2021 1722 Last data filed at 12/23/2021 1700 Gross per 24 hour  Intake 241.39 ml  Output 1900 ml  Net -1658.61 ml   Weight change:  Exam:  General:  Pt is alert, follows commands appropriately, not in acute distress HEENT: No icterus, No thrush, No neck mass, [place]/AT Cardiovascular: IRRR,  S1/S2, no rubs, no gallops Respiratory: bibasilar crackles., no wheezing, no crackles, no rhonchi Abdomen: Soft/+BS, non tender, non distended, no guarding Extremities: 1+LE  edema, No lymphangitis, No petechiae, No rashes, no synovitis   Data Reviewed: I have personally reviewed following labs and imaging studies Basic Metabolic Panel: Recent Labs  Lab 12/17/21 0608 12/21/21 1207 12/22/21 0405 12/23/21 0323  NA 140 134* 134* 133*  K 3.7 4.3 4.9 5.1  CL 110 105 107 107  CO2 24 22 18* 20*  GLUCOSE 152* 64* 121* 161*  BUN 73* 94* 96* 102*  CREATININE 3.18* 4.19* 4.09* 4.10*  CALCIUM 8.5* 8.2* 8.0* 7.7*  PHOS  --   --   --  5.9*   Liver Function Tests: Recent Labs  Lab 12/21/21 1207 12/23/21 0323  AST 22  --   ALT 21  --   ALKPHOS 46  --   BILITOT 0.7  --   PROT 6.5  --   ALBUMIN 2.9* 2.3*   No results for input(s): "LIPASE", "AMYLASE" in the last 168 hours. No results for input(s): "AMMONIA" in the last 168 hours. Coagulation Profile: No results for input(s): "INR", "PROTIME" in the last 168 hours. CBC: Recent Labs  Lab 12/17/21 0608 12/21/21 1207  WBC 6.2 4.7  NEUTROABS  --  3.2  HGB 8.8* 10.8*  HCT 26.6* 33.4*  MCV 89.6 91.5  PLT 181 234   Cardiac Enzymes: No results for input(s): "CKTOTAL", "CKMB", "CKMBINDEX", "TROPONINI" in the last 168 hours. BNP: Invalid input(s): "POCBNP" CBG: Recent Labs  Lab 12/22/21 1623 12/22/21 2114 12/23/21 0754 12/23/21 1131 12/23/21 1656  GLUCAP 224* 186* 154* 219* 213*   HbA1C: No results for input(s): "HGBA1C" in the last 72 hours. Urine analysis:    Component Value Date/Time   COLORURINE YELLOW 12/16/2021 1121   APPEARANCEUR HAZY (A) 12/16/2021 1121   LABSPEC 1.013 12/16/2021 1121   PHURINE 5.0 12/16/2021 1121   GLUCOSEU 150 (A) 12/16/2021 1121   HGBUR NEGATIVE 12/16/2021 1121   BILIRUBINUR NEGATIVE 12/16/2021 1121   KETONESUR NEGATIVE 12/16/2021 1121   PROTEINUR >=300 (A) 12/16/2021 1121   UROBILINOGEN  0.2 07/13/2012 1236   NITRITE NEGATIVE 12/16/2021 1121   LEUKOCYTESUR SMALL (A) 12/16/2021 1121   Sepsis Labs: '@LABRCNTIP'$ (procalcitonin:4,lacticidven:4) ) Recent Results (from the past 240 hour(s))  Urine Culture     Status: Abnormal   Collection Time: 12/16/21 11:57 AM   Specimen: Urine, Clean Catch  Result Value Ref Range Status   Specimen Description   Final    URINE, CLEAN CATCH Performed at [facility], [address], [place], [place] [address]    Special Requests   Final    Normal Performed at [facility], [address] [place], [place] [address]    Culture (A)  Final    <10,000 COLONIES/mL INSIGNIFICANT GROWTH Performed at [facility], [place] [address] [place],  [address]    Report Status 12/17/2021 FINAL  Final  Culture, blood (Routine X 2) w Reflex to ID Panel     Status: None (Preliminary result)   Collection Time: 12/21/21  3:06 PM   Specimen: Right Antecubital; Blood  Result Value Ref Range Status  Specimen Description RIGHT ANTECUBITAL  Final   Special Requests   Final    BOTTLES DRAWN AEROBIC AND ANAEROBIC Blood Culture adequate volume   Culture   Final    NO GROWTH 2 DAYS Performed at [facility], [address], [place], [place] [address]    Report Status PENDING  Incomplete  Culture, blood (Routine X 2) w Reflex to ID Panel     Status: None (Preliminary result)   Collection Time: 12/21/21  3:06 PM   Specimen: BLOOD RIGHT HAND  Result Value Ref Range Status   Specimen Description BLOOD RIGHT HAND  Final   Special Requests   Final    BOTTLES DRAWN AEROBIC ONLY Blood Culture adequate volume   Culture   Final    NO GROWTH 2 DAYS Performed at [facility], [address] [place], [place] [address]    Report Status PENDING  Incomplete     Scheduled Meds:  amLODipine  10 mg Oral Daily   apixaban  2.5 mg Oral BID   atorvastatin  40 mg Oral QHS   carvedilol  3.125 mg Oral BID WC   feeding supplement  237 mL Oral BID BM   furosemide  40  mg Oral Daily   hydrALAZINE  100 mg Oral BID   insulin aspart  0-5 Units Subcutaneous QHS   insulin aspart  0-9 Units Subcutaneous TID WC   Continuous Infusions:  Procedures/Studies: DG Chest Portable 1 View  Result Date: 12/16/2021 CLINICAL DATA:  Difficult the rising to standing position. EXAM: PORTABLE CHEST 1 VIEW COMPARISON:  11/10/2021 FINDINGS: Lungs are hypoinflated and otherwise clear. Mild stable cardiomegaly. Remainder of the exam is unchanged. IMPRESSION: Hypoinflation without acute cardiopulmonary disease. Electronically Signed   By: Marin Olp M.D.   On: 12/16/2021 13:15    Orson Eva, DO  [facility]  If 7PM-7AM, please contact night-coverage [email or web address] Password [email or web address] 12/23/2021, 5:22 PM   LOS: 2 days

## 2021-12-24 DIAGNOSIS — E875 Hyperkalemia: Secondary | ICD-10-CM | POA: Diagnosis not present

## 2021-12-24 DIAGNOSIS — E11649 Type 2 diabetes mellitus with hypoglycemia without coma: Secondary | ICD-10-CM | POA: Diagnosis not present

## 2021-12-24 DIAGNOSIS — N179 Acute kidney failure, unspecified: Secondary | ICD-10-CM | POA: Diagnosis not present

## 2021-12-24 DIAGNOSIS — I5032 Chronic diastolic (congestive) heart failure: Secondary | ICD-10-CM | POA: Diagnosis not present

## 2021-12-24 LAB — GLUCOSE, CAPILLARY
Glucose-Capillary: 137 mg/dL — ABNORMAL HIGH (ref 70–99)
Glucose-Capillary: 179 mg/dL — ABNORMAL HIGH (ref 70–99)
Glucose-Capillary: 167 mg/dL — ABNORMAL HIGH (ref 70–99)
Glucose-Capillary: 248 mg/dL — ABNORMAL HIGH (ref 70–99)

## 2021-12-24 LAB — RENAL FUNCTION PANEL
Albumin: 2.3 g/dL — ABNORMAL LOW (ref 3.5–5.0)
Anion gap: 6 (ref 5–15)
BUN: 97 mg/dL — ABNORMAL HIGH (ref 8–23)
CO2: 23 mmol/L (ref 22–32)
Calcium: 8.1 mg/dL — ABNORMAL LOW (ref 8.9–10.3)
Chloride: 106 mmol/L (ref 98–111)
Creatinine, Ser: 4.2 mg/dL — ABNORMAL HIGH (ref 0.44–1.00)
GFR, Estimated: 10 mL/min — ABNORMAL LOW (ref >60)
Glucose, Bld: 154 mg/dL — ABNORMAL HIGH (ref 70–99)
Phosphorus: 6.4 mg/dL — ABNORMAL HIGH (ref 2.5–4.6)
Potassium: 5.6 mmol/L — ABNORMAL HIGH (ref 3.5–5.1)
Sodium: 135 mmol/L (ref 135–145)

## 2021-12-24 MED ORDER — SODIUM ZIRCONIUM CYCLOSILICATE 10 G PO PACK
10.0000 g | PACK | Freq: Every day | ORAL | Status: DC
  Administered 2021-12-24 – 2021-12-27 (×4): 10 g via ORAL
  Filled 2021-12-24 (×5): qty 1

## 2021-12-24 NOTE — Plan of Care (Signed)
  Problem: Education: Goal: Ability to describe self-care measures that may prevent or decrease complications (Diabetes Survival Skills Education) will improve Outcome: Progressing   Problem: Coping: Goal: Ability to adjust to condition or change in health will improve Outcome: Progressing   Problem: Fluid Volume: Goal: Ability to maintain a balanced intake and output will improve Outcome: Progressing   Problem: Health Behavior/Discharge Planning: Goal: Ability to identify and utilize available resources and services will improve Outcome: Progressing Goal: Ability to manage health-related needs will improve Outcome: Progressing   Problem: Metabolic: Goal: Ability to maintain appropriate glucose levels will improve Outcome: Progressing   Problem: Nutritional: Goal: Maintenance of adequate nutrition will improve Outcome: Progressing Goal: Progress toward achieving an optimal weight will improve Outcome: Progressing   Problem: Skin Integrity: Goal: Risk for impaired skin integrity will decrease Outcome: Progressing   Problem: Tissue Perfusion: Goal: Adequacy of tissue perfusion will improve Outcome: Progressing   Problem: Education: Goal: Knowledge of General Education information will improve Description: Including pain rating scale, medication(s)/side effects and non-pharmacologic comfort measures Outcome: Progressing   Problem: Health Behavior/Discharge Planning: Goal: Ability to manage health-related needs will improve Outcome: Progressing

## 2021-12-24 NOTE — Progress Notes (Signed)
PROGRESS NOTE  Chelsea Meza DGU:[id number] DOB: May 05, 1935 DOA: 12/21/2021 PCP: Hal Morales, DO  Brief History:  86 year old female with a history of hypertension, hyperlipidemia, diabetes mellitus type 2, CKD stage IV, CHF, atrial fibrillation, hyperlipidemia presenting from [facility] secondary to altered mental status.  The patient was recently hospitalized from 12/16/2021 to 12/17/2021 secondary to acute on chronic renal failure.  She was discharged to [facility] at that time.  She states that she has been asking for bedtime snack around 8 PM the last 2 evenings, but she has not received them.  The patient's daughter called her earlier in the morning on 12/21/2021.  Apparently the patient was confused.  CBG was ultimately checked and she was noted to have a sugar of 12.  The patient was given glucagon by nursing home staff.  Glucose improved to 36.  Upon EMS arrival, her sugar improved to 73.  Nevertheless, the patient was brought to the emergency department for further evaluation and treatment.  The patient denies any fevers, chills, chest pain, shortness breath, cough, hemoptysis, nausea, vomiting, direct abdominal pain, dysuria, hematuria.  She states that her appetite has been pretty good. In the ED, the patient was afebrile and hemodynamically stable with oxygen saturation 100% room air.  BMP showed sodium 134, potassium 4.3, bicarbonate 22, glucose 64, serum creatinine 4.19.  LFTs were unremarkable.  WBC 4.7, hemoglobin 10.8, platelets 234,000.  EKG showed atrial fibrillation with no ST-T wave changes.  The patient was not actually noted to be hypothermic with a temperature 92.7.  She was placed on a warming blanket.    Assessment and Plan: * Acute renal failure superimposed on stage 4 chronic kidney disease, unspecified acute renal failure type ([place]) Secondary to volume depletion and hemodynamic changes Presented with serum creatinine 4.19 Baseline creatinine  3.0-3.3 D/c IVF, pt on hypervolemic side Not much improvement of renal function, likely progression of CKD Renal consult appreciated >>serum creatinine largely stable after starting home lasix  A-fib ([name]) Type unspecified Rate controlled Continue apixaban  Essential hypertension Continue amlodipine, carvedilol, hydralazine  Hyperkalemia Restart Lokelma Repeat BMP in am  Hypothermia Presented with temperature 92.7 Check cortisol level--16.6 12/16/2021 TSH 6.574, free T4--1.22 Check PCT--0.50 Blood culture x2 sets--neg to date -improved  Chronic diastolic CHF (congestive heart failure) ([place]) Home lasix dose restarted 12/23/21 10/27/2021 echo EF 60 to 65%, no WMA, trivial MR  Hypoglycemia associated with type 2 diabetes mellitus ([employer]) Suspect variable oral intake in the setting of long-acting Lantus and short acting Received Humalog 6 units 3 times daily at SNF Holding Lantus NovoLog sliding scale--sensitive scale 10/26/2021 hemoglobin A1c 6.7  Hyperlipidemia Continue statin   Family Communication:   Family at bedside   Consultants:  none   Code Status:  FULL    DVT Prophylaxis:  apixabn     Procedures: As Listed in Progress Note Above   Antibiotics: None      Subjective: Patient denies fevers, chills, headache, chest pain, dyspnea, nausea, vomiting, diarrhea, abdominal pain, dysuria, hematuria, hematochezia, and melena.   Objective: Vitals:   12/23/21 1714 12/23/21 2007 12/24/21 0825 12/24/21 1238  BP: (!) 166/97 (!) 172/85 (!) 169/96 (!) 161/96  Pulse: 87 72 72 68  Resp:  20  20  Temp:  97.9 F (36.6 C)  97.9 F (36.6 C)  TempSrc:  Oral  Oral  SpO2:  100%  100%  Weight:      Height:  No intake or output data in the 24 hours ending 12/24/21 1721 Weight change:  Exam:  General:  Pt is alert, follows commands appropriately, not in acute distress HEENT: No icterus, No thrush, No neck mass, [place]/AT Cardiovascular: RRR, S1/S2, no rubs, no  gallops Respiratory: bibasilar crackles. No wheeze Abdomen: Soft/+BS, non tender, non distended, no guarding Extremities: Nonpitting edema, No lymphangitis, No petechiae, No rashes, no synovitis   Data Reviewed: I have personally reviewed following labs and imaging studies Basic Metabolic Panel: Recent Labs  Lab 12/21/21 1207 12/22/21 0405 12/23/21 0323 12/24/21 0629  NA 134* 134* 133* 135  K 4.3 4.9 5.1 5.6*  CL 105 107 107 106  CO2 22 18* 20* 23  GLUCOSE 64* 121* 161* 154*  BUN 94* 96* 102* 97*  CREATININE 4.19* 4.09* 4.10* 4.20*  CALCIUM 8.2* 8.0* 7.7* 8.1*  PHOS  --   --  5.9* 6.4*   Liver Function Tests: Recent Labs  Lab 12/21/21 1207 12/23/21 0323 12/24/21 0629  AST 22  --   --   ALT 21  --   --   ALKPHOS 46  --   --   BILITOT 0.7  --   --   PROT 6.5  --   --   ALBUMIN 2.9* 2.3* 2.3*   No results for input(s): "LIPASE", "AMYLASE" in the last 168 hours. No results for input(s): "AMMONIA" in the last 168 hours. Coagulation Profile: No results for input(s): "INR", "PROTIME" in the last 168 hours. CBC: Recent Labs  Lab 12/21/21 1207  WBC 4.7  NEUTROABS 3.2  HGB 10.8*  HCT 33.4*  MCV 91.5  PLT 234   Cardiac Enzymes: No results for input(s): "CKTOTAL", "CKMB", "CKMBINDEX", "TROPONINI" in the last 168 hours. BNP: Invalid input(s): "POCBNP" CBG: Recent Labs  Lab 12/23/21 1656 12/23/21 2009 12/24/21 0729 12/24/21 1117 12/24/21 1625  GLUCAP 213* 165* 137* 179* 167*   HbA1C: No results for input(s): "HGBA1C" in the last 72 hours. Urine analysis:    Component Value Date/Time   COLORURINE YELLOW 12/16/2021 1121   APPEARANCEUR HAZY (A) 12/16/2021 1121   LABSPEC 1.013 12/16/2021 1121   PHURINE 5.0 12/16/2021 1121   GLUCOSEU 150 (A) 12/16/2021 1121   HGBUR NEGATIVE 12/16/2021 1121   BILIRUBINUR NEGATIVE 12/16/2021 1121   KETONESUR NEGATIVE 12/16/2021 1121   PROTEINUR >=300 (A) 12/16/2021 1121   UROBILINOGEN 0.2 07/13/2012 1236   NITRITE NEGATIVE  12/16/2021 1121   LEUKOCYTESUR SMALL (A) 12/16/2021 1121   Sepsis Labs: '@LABRCNTIP'$ (procalcitonin:4,lacticidven:4) ) Recent Results (from the past 240 hour(s))  Urine Culture     Status: Abnormal   Collection Time: 12/16/21 11:57 AM   Specimen: Urine, Clean Catch  Result Value Ref Range Status   Specimen Description   Final    URINE, CLEAN CATCH Performed at [facility], [address], [place], [place] [address]    Special Requests   Final    Normal Performed at [facility], [address] [place], [place] [address]    Culture (A)  Final    <10,000 COLONIES/mL INSIGNIFICANT GROWTH Performed at [facility], [place] [address] [place], [place] [address]    Report Status 12/17/2021 FINAL  Final  Culture, blood (Routine X 2) w Reflex to ID Panel     Status: None (Preliminary result)   Collection Time: 12/21/21  3:06 PM   Specimen: Right Antecubital; Blood  Result Value Ref Range Status   Specimen Description RIGHT ANTECUBITAL  Final   Special Requests  Final    BOTTLES DRAWN AEROBIC AND ANAEROBIC Blood Culture adequate volume   Culture   Final    NO GROWTH 2 DAYS Performed at [facility], [address] [place], [place] [address]    Report Status PENDING  Incomplete  Culture, blood (Routine X 2) w Reflex to ID Panel     Status: None (Preliminary result)   Collection Time: 12/21/21  3:06 PM   Specimen: BLOOD RIGHT HAND  Result Value Ref Range Status   Specimen Description BLOOD RIGHT HAND  Final   Special Requests   Final    BOTTLES DRAWN AEROBIC ONLY Blood Culture adequate volume   Culture   Final    NO GROWTH 2 DAYS Performed at [facility], [address] [place], [place] [address]    Report Status PENDING  Incomplete     Scheduled Meds:  amLODipine  10 mg Oral Daily   apixaban  2.5 mg Oral BID   atorvastatin  40 mg Oral QHS   carvedilol  3.125 mg Oral BID WC   feeding supplement  237 mL Oral BID BM   furosemide  40 mg Oral Daily   hydrALAZINE  100 mg  Oral BID   insulin aspart  0-5 Units Subcutaneous QHS   insulin aspart  0-9 Units Subcutaneous TID WC   sodium zirconium cyclosilicate  10 g Oral Daily   Continuous Infusions:  Procedures/Studies: DG Chest Portable 1 View  Result Date: 12/16/2021 CLINICAL DATA:  Difficult the rising to standing position. EXAM: PORTABLE CHEST 1 VIEW COMPARISON:  11/10/2021 FINDINGS: Lungs are hypoinflated and otherwise clear. Mild stable cardiomegaly. Remainder of the exam is unchanged. IMPRESSION: Hypoinflation without acute cardiopulmonary disease. Electronically Signed   By: Marin Olp M.D.   On: 12/16/2021 13:15    Orson Eva, DO  [facility]  If 7PM-7AM, please contact night-coverage [email or web address] Password [email or web address] 12/24/2021, 5:21 PM   LOS: 3 days

## 2021-12-24 NOTE — Assessment & Plan Note (Signed)
Restart Lokelma Repeat BMP in am

## 2021-12-24 NOTE — Progress Notes (Signed)
Pt pleasant and cooperative throughout this writers shift. No c/o pain or discomfort. All needs met. Resting comfortably in bed at this time. Will continue to monitor.

## 2021-12-25 DIAGNOSIS — I5032 Chronic diastolic (congestive) heart failure: Secondary | ICD-10-CM | POA: Diagnosis not present

## 2021-12-25 DIAGNOSIS — E875 Hyperkalemia: Secondary | ICD-10-CM | POA: Diagnosis not present

## 2021-12-25 DIAGNOSIS — E162 Hypoglycemia, unspecified: Secondary | ICD-10-CM | POA: Diagnosis not present

## 2021-12-25 DIAGNOSIS — N179 Acute kidney failure, unspecified: Secondary | ICD-10-CM | POA: Diagnosis not present

## 2021-12-25 LAB — RENAL FUNCTION PANEL
Albumin: 2.3 g/dL — ABNORMAL LOW (ref 3.5–5.0)
Anion gap: 6 (ref 5–15)
BUN: 104 mg/dL — ABNORMAL HIGH (ref 8–23)
CO2: 21 mmol/L — ABNORMAL LOW (ref 22–32)
Calcium: 8 mg/dL — ABNORMAL LOW (ref 8.9–10.3)
Chloride: 106 mmol/L (ref 98–111)
Creatinine, Ser: 4.13 mg/dL — ABNORMAL HIGH (ref 0.44–1.00)
GFR, Estimated: 10 mL/min — ABNORMAL LOW (ref >60)
Glucose, Bld: 129 mg/dL — ABNORMAL HIGH (ref 70–99)
Phosphorus: 6.8 mg/dL — ABNORMAL HIGH (ref 2.5–4.6)
Potassium: 5.1 mmol/L (ref 3.5–5.1)
Sodium: 133 mmol/L — ABNORMAL LOW (ref 135–145)

## 2021-12-25 LAB — GLUCOSE, CAPILLARY
Glucose-Capillary: 121 mg/dL — ABNORMAL HIGH (ref 70–99)
Glucose-Capillary: 143 mg/dL — ABNORMAL HIGH (ref 70–99)
Glucose-Capillary: 205 mg/dL — ABNORMAL HIGH (ref 70–99)
Glucose-Capillary: 201 mg/dL — ABNORMAL HIGH (ref 70–99)

## 2021-12-25 LAB — CBC
HCT: 23.1 % — ABNORMAL LOW (ref 36.0–46.0)
Hemoglobin: 7.7 g/dL — ABNORMAL LOW (ref 12.0–15.0)
MCH: 30.3 pg (ref 26.0–34.0)
MCHC: 33.3 g/dL (ref 30.0–36.0)
MCV: 90.9 fL (ref 80.0–100.0)
Platelets: 221 10*3/uL (ref 150–400)
RBC: 2.54 MIL/uL — ABNORMAL LOW (ref 3.87–5.11)
RDW: 13.7 % (ref 11.5–15.5)
WBC: 5.8 10*3/uL (ref 4.0–10.5)
nRBC: 0 % (ref 0.0–0.2)

## 2021-12-25 NOTE — Progress Notes (Addendum)
PROGRESS NOTE  Chelsea Meza IRJ:[id number] DOB: 1935/09/01 DOA: 12/21/2021 PCP: Hal Morales, DO  Brief History:  86 year old female with a history of hypertension, hyperlipidemia, diabetes mellitus type 2, CKD stage IV, CHF, atrial fibrillation, hyperlipidemia presenting from [facility] secondary to altered mental status.  The patient was recently hospitalized from 12/16/2021 to 12/17/2021 secondary to acute on chronic renal failure.  She was discharged to [facility] at that time.  She states that she has been asking for bedtime snack around 8 PM the last 2 evenings, but she has not received them.  The patient's daughter called her earlier in the morning on 12/21/2021.  Apparently the patient was confused.  CBG was ultimately checked and she was noted to have a sugar of 12.  The patient was given glucagon by nursing home staff.  Glucose improved to 36.  Upon EMS arrival, her sugar improved to 73.  Nevertheless, the patient was brought to the emergency department for further evaluation and treatment.  The patient denies any fevers, chills, chest pain, shortness breath, cough, hemoptysis, nausea, vomiting, direct abdominal pain, dysuria, hematuria.  She states that her appetite has been pretty good. In the ED, the patient was afebrile and hemodynamically stable with oxygen saturation 100% room air.  BMP showed sodium 134, potassium 4.3, bicarbonate 22, glucose 64, serum creatinine 4.19.  LFTs were unremarkable.  WBC 4.7, hemoglobin 10.8, platelets 234,000.  EKG showed atrial fibrillation with no ST-T wave changes.  The patient was not actually noted to be hypothermic with a temperature 92.7.  She was placed on a warming blanket.    Assessment and Plan: * Acute renal failure superimposed on stage 4 chronic kidney disease, unspecified acute renal failure type ([place]) Secondary to volume depletion and hemodynamic changes Presented with serum creatinine 4.19 Baseline creatinine  3.0-3.3 D/c IVF, pt on hypervolemic side Not much improvement of renal function, likely progression of CKD Renal consult appreciated >>serum creatinine largely stable after starting home lasix  A-fib ([name]) Type unspecified Rate controlled Continue apixaban  Essential hypertension Continue amlodipine, carvedilol, hydralazine  Hyperkalemia Restart Lokelma Repeat BMP in am  Hypothermia Presented with temperature 92.7 Check cortisol level--16.6 12/16/2021 TSH 6.574, free T4--1.22 Check PCT--0.50 Blood culture x2 sets--neg to date -improved  Chronic diastolic CHF (congestive heart failure) ([place]) Home lasix dose restarted 12/23/21 10/27/2021 echo EF 60 to 65%, no WMA, trivial MR  Hypoglycemia associated with type 2 diabetes mellitus ([place]) Suspect variable oral intake in the setting of long-acting Lantus and short acting Received Humalog 6 units 3 times daily at SNF Holding Lantus NovoLog sliding scale--sensitive scale 10/26/2021 hemoglobin A1c 6.7 No further hypoglycemic episodes  Hyperlipidemia Continue statin     Family Communication:   daughter updated at bedside 8/27   Consultants:  none   Code Status:  FULL    DVT Prophylaxis:  apixabn     Procedures: As Listed in Progress Note Above   Antibiotics: None            Subjective: Patient denies fevers, chills, headache, chest pain, dyspnea, nausea, vomiting, diarrhea, abdominal pain, dysuria, hematuria, hematochezia, and melena.   Objective: Vitals:   12/24/21 1238 12/24/21 2041 12/25/21 0500 12/25/21 1556  BP: (!) 161/96 (!) 151/85 (!) 160/86 124/80  Pulse: 68 74 66 79  Resp: '20 17 16 20  '$ Temp: 97.9 F (36.6 C) 98 F (36.7 C) 97.6 F (36.4 C) 97.7 F (36.5 C)  TempSrc: Oral  Oral Oral  SpO2: 100% 100% 100% 100%  Weight:      Height:        Intake/Output Summary (Last 24 hours) at 12/25/2021 1811 Last data filed at 12/25/2021 0500 Gross per 24 hour  Intake --  Output 350 ml  Net -350 ml    Weight change:  Exam:  General:  Pt is alert, follows commands appropriately, not in acute distress HEENT: No icterus, No thrush, No neck mass, [place]/AT Cardiovascular: RRR, S1/S2, no rubs, no gallops Respiratory: CTA bilaterally, no wheezing, no crackles, no rhonchi Abdomen: Soft/+BS, non tender, non distended, no guarding Extremities: No edema, No lymphangitis, No petechiae, No rashes, no synovitis   Data Reviewed: I have personally reviewed following labs and imaging studies Basic Metabolic Panel: Recent Labs  Lab 12/21/21 1207 12/22/21 0405 12/23/21 0323 12/24/21 0629 12/25/21 0617  NA 134* 134* 133* 135 133*  K 4.3 4.9 5.1 5.6* 5.1  CL 105 107 107 106 106  CO2 22 18* 20* 23 21*  GLUCOSE 64* 121* 161* 154* 129*  BUN 94* 96* 102* 97* 104*  CREATININE 4.19* 4.09* 4.10* 4.20* 4.13*  CALCIUM 8.2* 8.0* 7.7* 8.1* 8.0*  PHOS  --   --  5.9* 6.4* 6.8*   Liver Function Tests: Recent Labs  Lab 12/21/21 1207 12/23/21 0323 12/24/21 0629 12/25/21 0617  AST 22  --   --   --   ALT 21  --   --   --   ALKPHOS 46  --   --   --   BILITOT 0.7  --   --   --   PROT 6.5  --   --   --   ALBUMIN 2.9* 2.3* 2.3* 2.3*   No results for input(s): "LIPASE", "AMYLASE" in the last 168 hours. No results for input(s): "AMMONIA" in the last 168 hours. Coagulation Profile: No results for input(s): "INR", "PROTIME" in the last 168 hours. CBC: Recent Labs  Lab 12/21/21 1207 12/25/21 0617  WBC 4.7 5.8  NEUTROABS 3.2  --   HGB 10.8* 7.7*  HCT 33.4* 23.1*  MCV 91.5 90.9  PLT 234 221   Cardiac Enzymes: No results for input(s): "CKTOTAL", "CKMB", "CKMBINDEX", "TROPONINI" in the last 168 hours. BNP: Invalid input(s): "POCBNP" CBG: Recent Labs  Lab 12/24/21 1625 12/24/21 2042 12/25/21 0734 12/25/21 1134 12/25/21 1631  GLUCAP 167* 248* 121* 143* 205*   HbA1C: No results for input(s): "HGBA1C" in the last 72 hours. Urine analysis:    Component Value Date/Time   COLORURINE YELLOW  12/16/2021 1121   APPEARANCEUR HAZY (A) 12/16/2021 1121   LABSPEC 1.013 12/16/2021 1121   PHURINE 5.0 12/16/2021 1121   GLUCOSEU 150 (A) 12/16/2021 1121   HGBUR NEGATIVE 12/16/2021 1121   BILIRUBINUR NEGATIVE 12/16/2021 1121   KETONESUR NEGATIVE 12/16/2021 1121   PROTEINUR >=300 (A) 12/16/2021 1121   UROBILINOGEN 0.2 07/13/2012 1236   NITRITE NEGATIVE 12/16/2021 1121   LEUKOCYTESUR SMALL (A) 12/16/2021 1121   Sepsis Labs: '@LABRCNTIP'$ (procalcitonin:4,lacticidven:4) ) Recent Results (from the past 240 hour(s))  Urine Culture     Status: Abnormal   Collection Time: 12/16/21 11:57 AM   Specimen: Urine, Clean Catch  Result Value Ref Range Status   Specimen Description   Final    URINE, CLEAN CATCH Performed at [facility], [address], [place], [place] [address]    Special Requests   Final    Normal Performed at [facility], [address] [place], [place] [address]    Culture (  A)  Final    <10,000 COLONIES/mL INSIGNIFICANT GROWTH Performed at [facility] [address] [place], [place] [address]    Report Status 12/17/2021 FINAL  Final  Culture, blood (Routine X 2) w Reflex to ID Panel     Status: None (Preliminary result)   Collection Time: 12/21/21  3:06 PM   Specimen: Right Antecubital; Blood  Result Value Ref Range Status   Specimen Description RIGHT ANTECUBITAL  Final   Special Requests   Final    BOTTLES DRAWN AEROBIC AND ANAEROBIC Blood Culture adequate volume   Culture   Final    NO GROWTH 3 DAYS Performed at [facility], [address], [place], [place] [address]    Report Status PENDING  Incomplete  Culture, blood (Routine X 2) w Reflex to ID Panel     Status: None (Preliminary result)   Collection Time: 12/21/21  3:06 PM   Specimen: BLOOD RIGHT HAND  Result Value Ref Range Status   Specimen Description BLOOD RIGHT HAND  Final   Special Requests   Final    BOTTLES DRAWN AEROBIC ONLY Blood Culture adequate volume   Culture   Final    NO GROWTH 3  DAYS Performed at [facility], [address], [place], [place] [address]    Report Status PENDING  Incomplete     Scheduled Meds:  amLODipine  10 mg Oral Daily   apixaban  2.5 mg Oral BID   atorvastatin  40 mg Oral QHS   carvedilol  3.125 mg Oral BID WC   feeding supplement  237 mL Oral BID BM   furosemide  40 mg Oral Daily   hydrALAZINE  100 mg Oral BID   insulin aspart  0-5 Units Subcutaneous QHS   insulin aspart  0-9 Units Subcutaneous TID WC   sodium zirconium cyclosilicate  10 g Oral Daily   Continuous Infusions:  Procedures/Studies: DG Chest Portable 1 View  Result Date: 12/16/2021 CLINICAL DATA:  Difficult the rising to standing position. EXAM: PORTABLE CHEST 1 VIEW COMPARISON:  11/10/2021 FINDINGS: Lungs are hypoinflated and otherwise clear. Mild stable cardiomegaly. Remainder of the exam is unchanged. IMPRESSION: Hypoinflation without acute cardiopulmonary disease. Electronically Signed   By: Marin Olp M.D.   On: 12/16/2021 13:15    Orson Eva, DO  [facility]  If 7PM-7AM, please contact night-coverage [email or web address] Password [email or web address] 12/25/2021, 6:11 PM   LOS: 4 days

## 2021-12-26 DIAGNOSIS — N179 Acute kidney failure, unspecified: Secondary | ICD-10-CM | POA: Diagnosis not present

## 2021-12-26 DIAGNOSIS — N184 Chronic kidney disease, stage 4 (severe): Secondary | ICD-10-CM | POA: Diagnosis not present

## 2021-12-26 DIAGNOSIS — I5032 Chronic diastolic (congestive) heart failure: Secondary | ICD-10-CM | POA: Diagnosis not present

## 2021-12-26 DIAGNOSIS — E875 Hyperkalemia: Secondary | ICD-10-CM | POA: Diagnosis not present

## 2021-12-26 LAB — CULTURE, BLOOD (ROUTINE X 2)
Culture: NO GROWTH
Culture: NO GROWTH
Special Requests: ADEQUATE
Special Requests: ADEQUATE

## 2021-12-26 LAB — CBC
HCT: 27.1 % — ABNORMAL LOW (ref 36.0–46.0)
Hemoglobin: 8.6 g/dL — ABNORMAL LOW (ref 12.0–15.0)
MCH: 29.8 pg (ref 26.0–34.0)
MCHC: 31.7 g/dL (ref 30.0–36.0)
MCV: 93.8 fL (ref 80.0–100.0)
Platelets: 259 10*3/uL (ref 150–400)
RBC: 2.89 MIL/uL — ABNORMAL LOW (ref 3.87–5.11)
RDW: 13.6 % (ref 11.5–15.5)
WBC: 5.4 10*3/uL (ref 4.0–10.5)
nRBC: 0 % (ref 0.0–0.2)

## 2021-12-26 LAB — RENAL FUNCTION PANEL
Albumin: 2.4 g/dL — ABNORMAL LOW (ref 3.5–5.0)
Anion gap: 9 (ref 5–15)
BUN: 107 mg/dL — ABNORMAL HIGH (ref 8–23)
CO2: 18 mmol/L — ABNORMAL LOW (ref 22–32)
Calcium: 8.2 mg/dL — ABNORMAL LOW (ref 8.9–10.3)
Chloride: 103 mmol/L (ref 98–111)
Creatinine, Ser: 4.24 mg/dL — ABNORMAL HIGH (ref 0.44–1.00)
GFR, Estimated: 10 mL/min — ABNORMAL LOW (ref >60)
Glucose, Bld: 176 mg/dL — ABNORMAL HIGH (ref 70–99)
Phosphorus: 7.1 mg/dL — ABNORMAL HIGH (ref 2.5–4.6)
Potassium: 5.2 mmol/L — ABNORMAL HIGH (ref 3.5–5.1)
Sodium: 130 mmol/L — ABNORMAL LOW (ref 135–145)

## 2021-12-26 LAB — GLUCOSE, CAPILLARY
Glucose-Capillary: 130 mg/dL — ABNORMAL HIGH (ref 70–99)
Glucose-Capillary: 150 mg/dL — ABNORMAL HIGH (ref 70–99)
Glucose-Capillary: 196 mg/dL — ABNORMAL HIGH (ref 70–99)
Glucose-Capillary: 141 mg/dL — ABNORMAL HIGH (ref 70–99)

## 2021-12-26 MED ORDER — SENNA 8.6 MG PO TABS
2.0000 | ORAL_TABLET | Freq: Every day | ORAL | Status: DC
  Administered 2021-12-26 – 2022-01-08 (×10): 17.2 mg via ORAL
  Filled 2021-12-26 (×14): qty 2

## 2021-12-26 MED ORDER — INSULIN ASPART 100 UNIT/ML IJ SOLN: INTRAMUSCULAR | 11 refills | Status: DC

## 2021-12-26 MED ORDER — SODIUM ZIRCONIUM CYCLOSILICATE 10 G PO PACK: 10.0000 g | PACK | ORAL | Status: AC

## 2021-12-26 MED ORDER — POLYETHYLENE GLYCOL 3350 17 G PO PACK
17.0000 g | PACK | Freq: Every day | ORAL | Status: DC
  Administered 2021-12-26 – 2022-01-08 (×10): 17 g via ORAL
  Filled 2021-12-26 (×13): qty 1

## 2021-12-26 NOTE — TOC Progression Note (Signed)
Transition of Care [facility]) - Progression Note    Patient Details  Name: BRANDICE BUSSER MRN: [id number] Date of Birth: [birth year]  Transition of Care [facility]) CM/SW Contact  Boneta Lucks, RN Phone Number: 12/26/2021, 12:22 PM  Clinical Narrative:   INS AUTH approved 8/28 - 8/30, Candace Cruise id [id number], plan auth id [id number]. TOC updated [name] at [facility] [place] for tomorrow.    Addendum : TOC calling [facility] to updated AUTH, DC planning for 8/31   Expected Discharge Plan: Skilled Nursing Facility Barriers to Discharge: Continued Medical Work up  Expected Discharge Plan and Services Expected Discharge Plan: [place]     Readmission Risk Interventions    12/22/2021    2:27 PM 02/24/2021   12:38 PM  Readmission Risk Prevention Plan  Transportation Screening Complete Complete  PCP or Specialist Appt within 3-5 Days Complete   HRI or Home Care Consult Complete Patient refused  Social Work Consult for [place] Planning/Counseling Complete Complete  Palliative Care Screening Not Applicable Not Applicable  Medication Review [employer]) Complete Complete

## 2021-12-26 NOTE — Progress Notes (Signed)
Pt slept well throughout the shift with only complaint of a headache that was relieved  with PRN tylenol.

## 2021-12-26 NOTE — Progress Notes (Signed)
PROGRESS NOTE  Chelsea Meza [id number]:[id number] DOB: 08-02-1935 DOA: 12/21/2021 PCP: Hal Morales, DO  Brief History:  86 year old female with a history of hypertension, hyperlipidemia, diabetes mellitus type 2, CKD stage IV, CHF, atrial fibrillation, hyperlipidemia presenting from [facility] secondary to altered mental status.  The patient was recently hospitalized from 12/16/2021 to 12/17/2021 secondary to acute on chronic renal failure.  She was discharged to [facility] at that time.  She states that she has been asking for bedtime snack around 8 PM the last 2 evenings, but she has not received them.  The patient's daughter called her earlier in the morning on 12/21/2021.  Apparently the patient was confused.  CBG was ultimately checked and she was noted to have a sugar of 12.  The patient was given glucagon by nursing home staff.  Glucose improved to 36.  Upon EMS arrival, her sugar improved to 73.  Nevertheless, the patient was brought to the emergency department for further evaluation and treatment.  The patient denies any fevers, chills, chest pain, shortness breath, cough, hemoptysis, nausea, vomiting, direct abdominal pain, dysuria, hematuria.  She states that her appetite has been pretty good. In the ED, the patient was afebrile and hemodynamically stable with oxygen saturation 100% room air.  BMP showed sodium 134, potassium 4.3, bicarbonate 22, glucose 64, serum creatinine 4.19.  LFTs were unremarkable.  WBC 4.7, hemoglobin 10.8, platelets 234,000.  EKG showed atrial fibrillation with no ST-T wave changes.  The patient was not actually noted to be hypothermic with a temperature 92.7.  She was placed on a warming blanket. Prolonged hospitalization due to slow renal recovery.     Assessment and Plan: * Acute renal failure superimposed on stage 4 chronic kidney disease, unspecified acute renal failure type ([place]) Secondary to volume depletion and hemodynamic  changes Presented with serum creatinine 4.19 Baseline creatinine 3.0-3.3 D/c IVF, pt on hypervolemic side Not much improvement of renal function, likely progression of CKD Renal consult appreciated >>serum creatinine largely stable after starting home lasix  A-fib ([name]) Type unspecified Rate controlled Continue apixaban  Essential hypertension Continue amlodipine, carvedilol, hydralazine  Hyperkalemia Restart Lokelma daily Repeat BMP in am There has been some dietary indiscretion  Hypothermia Presented with temperature 92.7 Check cortisol level--16.6 12/16/2021 TSH 6.574, free T4--1.22 Check PCT--0.50 Blood culture x2 sets--neg to date -improved  Chronic diastolic CHF (congestive heart failure) ([place]) Home lasix dose restarted 12/23/21 10/27/2021 echo EF 60 to 65%, no WMA, trivial MR  Hypoglycemia associated with type 2 diabetes mellitus ([place]) Suspect variable oral intake in the setting of long-acting Lantus and short acting Received Humalog 6 units 3 times daily at SNF Holding Lantus NovoLog sliding scale--sensitive scale 10/26/2021 hemoglobin A1c 6.7 No further hypoglycemic episodes  Hyperlipidemia Continue statin   Family Communication:   daughter updated at bedside 8/28   Consultants:  none   Code Status:  FULL    DVT Prophylaxis:  apixaban     Procedures: As Listed in Progress Note Above   Antibiotics: None           Subjective:  Patient denies fevers, chills, headache, chest pain, dyspnea, nausea, vomiting, diarrhea, abdominal pain, dysuria, hematuria, hematochezia, and melena.  Objective: Vitals:   12/25/21 1556 12/25/21 2135 12/26/21 0532 12/26/21 1629  BP: 124/80 (!) 159/78 (!) 157/90 (!) 153/84  Pulse: 79 73 77 76  Resp: '20 20 14 20  '$ Temp: 97.7 F (36.5 C) 98.1 F (36.7 C) (!)  97.5 F (36.4 C) 97.8 F (36.6 C)  TempSrc: Oral  Oral Oral  SpO2: 100% 100% 100% 100%  Weight:      Height:        Intake/Output Summary (Last 24  hours) at 12/26/2021 1641 Last data filed at 12/26/2021 0544 Gross per 24 hour  Intake --  Output 1800 ml  Net -1800 ml   Weight change:  Exam:  General:  Pt is alert, follows commands appropriately, not in acute distress HEENT: No icterus, No thrush, No neck mass, /AT Cardiovascular: RRR, S1/S2, no rubs, no gallops Respiratory: bibasilar rales. No wheeze Abdomen: Soft/+BS, non tender, non distended, no guarding Extremities: Nonpitting edema, No lymphangitis, No petechiae, No rashes, no synovitis   Data Reviewed: I have personally reviewed following labs and imaging studies Basic Metabolic Panel: Recent Labs  Lab 12/22/21 0405 12/23/21 0323 12/24/21 0629 12/25/21 0617 12/26/21 0858  NA 134* 133* 135 133* 130*  K 4.9 5.1 5.6* 5.1 5.2*  CL 107 107 106 106 103  CO2 18* 20* 23 21* 18*  GLUCOSE 121* 161* 154* 129* 176*  BUN 96* 102* 97* 104* 107*  CREATININE 4.09* 4.10* 4.20* 4.13* 4.24*  CALCIUM 8.0* 7.7* 8.1* 8.0* 8.2*  PHOS  --  5.9* 6.4* 6.8* 7.1*   Liver Function Tests: Recent Labs  Lab 12/21/21 1207 12/23/21 0323 12/24/21 0629 12/25/21 0617 12/26/21 0858  AST 22  --   --   --   --   ALT 21  --   --   --   --   ALKPHOS 46  --   --   --   --   BILITOT 0.7  --   --   --   --   PROT 6.5  --   --   --   --   ALBUMIN 2.9* 2.3* 2.3* 2.3* 2.4*   No results for input(s): "LIPASE", "AMYLASE" in the last 168 hours. No results for input(s): "AMMONIA" in the last 168 hours. Coagulation Profile: No results for input(s): "INR", "PROTIME" in the last 168 hours. CBC: Recent Labs  Lab 12/21/21 1207 12/25/21 0617 12/26/21 0858  WBC 4.7 5.8 5.4  NEUTROABS 3.2  --   --   HGB 10.8* 7.7* 8.6*  HCT 33.4* 23.1* 27.1*  MCV 91.5 90.9 93.8  PLT 234 221 259   Cardiac Enzymes: No results for input(s): "CKTOTAL", "CKMB", "CKMBINDEX", "TROPONINI" in the last 168 hours. BNP: Invalid input(s): "POCBNP" CBG: Recent Labs  Lab 12/25/21 0734 12/25/21 1134 12/25/21 1631  12/25/21 2134 12/26/21 1630  GLUCAP 121* 143* 205* 201* 196*   HbA1C: No results for input(s): "HGBA1C" in the last 72 hours. Urine analysis:    Component Value Date/Time   COLORURINE YELLOW 12/16/2021 1121   APPEARANCEUR HAZY (A) 12/16/2021 1121   LABSPEC 1.013 12/16/2021 1121   PHURINE 5.0 12/16/2021 1121   GLUCOSEU 150 (A) 12/16/2021 1121   HGBUR NEGATIVE 12/16/2021 1121   BILIRUBINUR NEGATIVE 12/16/2021 1121   KETONESUR NEGATIVE 12/16/2021 1121   PROTEINUR >=300 (A) 12/16/2021 1121   UROBILINOGEN 0.2 07/13/2012 1236   NITRITE NEGATIVE 12/16/2021 1121   LEUKOCYTESUR SMALL (A) 12/16/2021 1121   Sepsis Labs: '@LABRCNTIP'$ (procalcitonin:4,lacticidven:4) ) Recent Results (from the past 240 hour(s))  Culture, blood (Routine X 2) w Reflex to ID Panel     Status: None (Preliminary result)   Collection Time: 12/21/21  3:06 PM   Specimen: Right Antecubital; Blood  Result Value Ref Range Status   Specimen Description RIGHT ANTECUBITAL  Final   Special Requests   Final    BOTTLES DRAWN AEROBIC AND ANAEROBIC Blood Culture adequate volume   Culture   Final    NO GROWTH 4 DAYS Performed at [facility], [address], [place], [place] [address]    Report Status PENDING  Incomplete  Culture, blood (Routine X 2) w Reflex to ID Panel     Status: None (Preliminary result)   Collection Time: 12/21/21  3:06 PM   Specimen: BLOOD RIGHT HAND  Result Value Ref Range Status   Specimen Description BLOOD RIGHT HAND  Final   Special Requests   Final    BOTTLES DRAWN AEROBIC ONLY Blood Culture adequate volume   Culture   Final    NO GROWTH 4 DAYS Performed at [facility], [address], [place], [place] [address]    Report Status PENDING  Incomplete     Scheduled Meds:  amLODipine  10 mg Oral Daily   apixaban  2.5 mg Oral BID   atorvastatin  40 mg Oral QHS   carvedilol  3.125 mg Oral BID WC   feeding supplement  237 mL Oral BID BM   furosemide  40 mg Oral Daily   hydrALAZINE  100 mg  Oral BID   insulin aspart  0-5 Units Subcutaneous QHS   insulin aspart  0-9 Units Subcutaneous TID WC   polyethylene glycol  17 g Oral Daily   senna  2 tablet Oral Daily   sodium zirconium cyclosilicate  10 g Oral Daily   Continuous Infusions:  Procedures/Studies: DG Chest Portable 1 View  Result Date: 12/16/2021 CLINICAL DATA:  Difficult the rising to standing position. EXAM: PORTABLE CHEST 1 VIEW COMPARISON:  11/10/2021 FINDINGS: Lungs are hypoinflated and otherwise clear. Mild stable cardiomegaly. Remainder of the exam is unchanged. IMPRESSION: Hypoinflation without acute cardiopulmonary disease. Electronically Signed   By: Marin Olp M.D.   On: 12/16/2021 13:15    Orson Eva, DO  [facility]  If 7PM-7AM, please contact night-coverage [email or web address] Password [facility] 12/26/2021, 4:41 PM   LOS: 5 days

## 2021-12-27 ENCOUNTER — Inpatient Hospital Stay (HOSPITAL_COMMUNITY): Payer: Medicare Other

## 2021-12-27 DIAGNOSIS — N179 Acute kidney failure, unspecified: Secondary | ICD-10-CM | POA: Diagnosis not present

## 2021-12-27 DIAGNOSIS — E875 Hyperkalemia: Secondary | ICD-10-CM | POA: Diagnosis not present

## 2021-12-27 DIAGNOSIS — I10 Essential (primary) hypertension: Secondary | ICD-10-CM | POA: Diagnosis not present

## 2021-12-27 DIAGNOSIS — I5032 Chronic diastolic (congestive) heart failure: Secondary | ICD-10-CM | POA: Diagnosis not present

## 2021-12-27 LAB — GLUCOSE, CAPILLARY
Glucose-Capillary: 139 mg/dL — ABNORMAL HIGH (ref 70–99)
Glucose-Capillary: 189 mg/dL — ABNORMAL HIGH (ref 70–99)
Glucose-Capillary: 266 mg/dL — ABNORMAL HIGH (ref 70–99)
Glucose-Capillary: 218 mg/dL — ABNORMAL HIGH (ref 70–99)

## 2021-12-27 LAB — URINALYSIS, ROUTINE W REFLEX MICROSCOPIC
Bilirubin Urine: NEGATIVE
Glucose, UA: NEGATIVE mg/dL
Hgb urine dipstick: NEGATIVE
Ketones, ur: NEGATIVE mg/dL
Leukocytes,Ua: NEGATIVE
Nitrite: NEGATIVE
Protein, ur: 100 mg/dL — AB
Specific Gravity, Urine: 1.01 (ref 1.005–1.030)
pH: 5 (ref 5.0–8.0)

## 2021-12-27 LAB — TSH: TSH: 9.327 u[IU]/mL — ABNORMAL HIGH (ref 0.350–4.500)

## 2021-12-27 LAB — RENAL FUNCTION PANEL
Albumin: 2.3 g/dL — ABNORMAL LOW (ref 3.5–5.0)
Anion gap: 7 (ref 5–15)
BUN: 110 mg/dL — ABNORMAL HIGH (ref 8–23)
CO2: 21 mmol/L — ABNORMAL LOW (ref 22–32)
Calcium: 8.3 mg/dL — ABNORMAL LOW (ref 8.9–10.3)
Chloride: 104 mmol/L (ref 98–111)
Creatinine, Ser: 4.28 mg/dL — ABNORMAL HIGH (ref 0.44–1.00)
GFR, Estimated: 10 mL/min — ABNORMAL LOW (ref >60)
Glucose, Bld: 126 mg/dL — ABNORMAL HIGH (ref 70–99)
Phosphorus: 7.2 mg/dL — ABNORMAL HIGH (ref 2.5–4.6)
Potassium: 5.5 mmol/L — ABNORMAL HIGH (ref 3.5–5.1)
Sodium: 132 mmol/L — ABNORMAL LOW (ref 135–145)

## 2021-12-27 LAB — IRON AND TIBC
Iron: 63 ug/dL (ref 28–170)
Saturation Ratios: 28 % (ref 10.4–31.8)
TIBC: 227 ug/dL — ABNORMAL LOW (ref 250–450)
UIBC: 164 ug/dL

## 2021-12-27 LAB — OSMOLALITY: Osmolality: 324 mOsm/kg (ref 275–295)

## 2021-12-27 LAB — NA AND K (SODIUM & POTASSIUM), RAND UR
Potassium Urine: 41 mmol/L
Sodium, Ur: 38 mmol/L

## 2021-12-27 LAB — CREATININE, URINE, RANDOM: Creatinine, Urine: 52.61 mg/dL

## 2021-12-27 LAB — OSMOLALITY, URINE: Osmolality, Ur: 342 mOsm/kg (ref 300–900)

## 2021-12-27 LAB — CORTISOL-AM, BLOOD: Cortisol - AM: 14.5 ug/dL (ref 6.7–22.6)

## 2021-12-27 LAB — BRAIN NATRIURETIC PEPTIDE: B Natriuretic Peptide: 87 pg/mL (ref 0.0–100.0)

## 2021-12-27 NOTE — Progress Notes (Signed)
PROGRESS NOTE  [name] PPI:[id number] DOB: [birth year] DOA: 12/21/2021 PCP: Hal Morales, DO  Brief History:  86 year old female with a history of hypertension, hyperlipidemia, diabetes mellitus type 2, CKD stage IV, CHF, atrial fibrillation, hyperlipidemia presenting from [facility] secondary to altered mental status.  The patient was recently hospitalized from 12/16/2021 to 12/17/2021 secondary to acute on chronic renal failure.  She was discharged to [facility] at that time.  She states that she has been asking for bedtime snack around 8 PM the last 2 evenings, but she has not received them.  The patient's daughter called her earlier in the morning on 12/21/2021.  Apparently the patient was confused.  CBG was ultimately checked and she was noted to have a sugar of 12.  The patient was given glucagon by nursing home staff.  Glucose improved to 36.  Upon EMS arrival, her sugar improved to 73.  Nevertheless, the patient was brought to the emergency department for further evaluation and treatment.  The patient denies any fevers, chills, chest pain, shortness breath, cough, hemoptysis, nausea, vomiting, direct abdominal pain, dysuria, hematuria.  She states that her appetite has been pretty good. In the ED, the patient was afebrile and hemodynamically stable with oxygen saturation 100% room air.  BMP showed sodium 134, potassium 4.3, bicarbonate 22, glucose 64, serum creatinine 4.19.  LFTs were unremarkable.  WBC 4.7, hemoglobin 10.8, platelets 234,000.  EKG showed atrial fibrillation with no ST-T wave changes.  The patient was not actually noted to be hypothermic with a temperature 92.7.  She was placed on a warming blanket. Prolonged hospitalization due to slow renal recovery.  Nephrology consulted and following     Assessment and Plan: * Acute renal failure superimposed on stage 4 chronic kidney disease, unspecified acute renal failure type ([place]) Secondary to volume  depletion and hemodynamic changes Presented with serum creatinine 4.19 Baseline creatinine 3.0-3.3 D/c IVF, pt on hypervolemic side Not much improvement of renal function, likely progression of CKD Renal consult appreciated Discussed with pt and daughter about HD>>noncommital but likely would agree when time arrives or if sense of urgency >>serum creatinine largely stable after starting home lasix, but now slowly rising again   A-fib [facility]) Type unspecified Rate controlled Continue apixaban Continue coreg  Essential hypertension Continue amlodipine, carvedilol, hydralazine  Hyperkalemia Restart Lokelma daily Repeat BMP in am There has been some dietary indiscretion  Hypothermia Presented with temperature 92.7 Check cortisol level--16.6 12/16/2021 TSH 6.574, free T4--1.22 Check PCT--0.50 Blood culture x2 sets--neg to date -improved  Chronic diastolic CHF (congestive heart failure) ([place]) Home lasix dose restarted 12/23/21 10/27/2021 echo EF 60 to 65%, no WMA, trivial MR  Hypoglycemia associated with type 2 diabetes mellitus ([place]) Suspect variable oral intake in the setting of long-acting Lantus and short acting insulin with worsen renal funciton  Received Humalog 6 units 3 times daily at SNF Holding Lantus NovoLog sliding scale--sensitive scale 10/26/2021 hemoglobin A1c 6.7 No further hypoglycemic episodes  Hyperlipidemia Continue statin     Family Communication:   daughter updated at bedside 8/28   Consultants:  none   Code Status:  FULL    DVT Prophylaxis:  apixaban     Procedures: As Listed in Progress Note Above   Antibiotics: None           Subjective: Patient denies fevers, chills, headache, chest pain, dyspnea, nausea, vomiting, diarrhea, abdominal pain, dysuria, hematuria, hematochezia, and melena.   Objective: Vitals:  12/26/21 2050 12/27/21 0542 12/27/21 0902 12/27/21 1430  BP: (!) 155/80 (!) 161/76 (!) 143/72 (!) 143/81  Pulse: 71  69 75 73  Resp:    16  Temp: 97.7 F (36.5 C) 97.6 F (36.4 C)  (!) 97.4 F (36.3 C)  TempSrc: Oral Oral    SpO2: 100% 100% 100% 100%  Weight:      Height:        Intake/Output Summary (Last 24 hours) at 12/27/2021 1726 Last data filed at 12/27/2021 1300 Gross per 24 hour  Intake 480 ml  Output 1400 ml  Net -920 ml   Weight change:  Exam:  General:  Pt is alert, follows commands appropriately, not in acute distress HEENT: No icterus, No thrush, No neck mass, [place]/AT Cardiovascular: IRRR, S1/S2, no rubs, no gallops Respiratory: bibasilar crackles. No wheeze Abdomen: Soft/+BS, non tender, non distended, no guarding Extremities: 1+LE  edema, No lymphangitis, No petechiae, No rashes, no synovitis   Data Reviewed: I have personally reviewed following labs and imaging studies Basic Metabolic Panel: Recent Labs  Lab 12/23/21 0323 12/24/21 0629 12/25/21 0617 12/26/21 0858 12/27/21 0424  NA 133* 135 133* 130* 132*  K 5.1 5.6* 5.1 5.2* 5.5*  CL 107 106 106 103 104  CO2 20* 23 21* 18* 21*  GLUCOSE 161* 154* 129* 176* 126*  BUN 102* 97* 104* 107* 110*  CREATININE 4.10* 4.20* 4.13* 4.24* 4.28*  CALCIUM 7.7* 8.1* 8.0* 8.2* 8.3*  PHOS 5.9* 6.4* 6.8* 7.1* 7.2*   Liver Function Tests: Recent Labs  Lab 12/21/21 1207 12/23/21 0323 12/24/21 0629 12/25/21 0617 12/26/21 0858 12/27/21 0424  AST 22  --   --   --   --   --   ALT 21  --   --   --   --   --   ALKPHOS 46  --   --   --   --   --   BILITOT 0.7  --   --   --   --   --   PROT 6.5  --   --   --   --   --   ALBUMIN 2.9* 2.3* 2.3* 2.3* 2.4* 2.3*   No results for input(s): "LIPASE", "AMYLASE" in the last 168 hours. No results for input(s): "AMMONIA" in the last 168 hours. Coagulation Profile: No results for input(s): "INR", "PROTIME" in the last 168 hours. CBC: Recent Labs  Lab 12/21/21 1207 12/25/21 0617 12/26/21 0858  WBC 4.7 5.8 5.4  NEUTROABS 3.2  --   --   HGB 10.8* 7.7* 8.6*  HCT 33.4* 23.1* 27.1*  MCV  91.5 90.9 93.8  PLT 234 221 259   Cardiac Enzymes: No results for input(s): "CKTOTAL", "CKMB", "CKMBINDEX", "TROPONINI" in the last 168 hours. BNP: Invalid input(s): "POCBNP" CBG: Recent Labs  Lab 12/26/21 1630 12/26/21 2053 12/27/21 0810 12/27/21 1123 12/27/21 1630  GLUCAP 196* 141* 139* 189* 266*   HbA1C: No results for input(s): "HGBA1C" in the last 72 hours. Urine analysis:    Component Value Date/Time   COLORURINE YELLOW 12/27/2021 0810   APPEARANCEUR CLEAR 12/27/2021 0810   LABSPEC 1.010 12/27/2021 0810   PHURINE 5.0 12/27/2021 0810   GLUCOSEU NEGATIVE 12/27/2021 0810   HGBUR NEGATIVE 12/27/2021 0810   BILIRUBINUR NEGATIVE 12/27/2021 0810   KETONESUR NEGATIVE 12/27/2021 0810   PROTEINUR 100 (A) 12/27/2021 0810   UROBILINOGEN 0.2 07/13/2012 1236   NITRITE NEGATIVE 12/27/2021 0810   LEUKOCYTESUR NEGATIVE 12/27/2021 0810   Sepsis Labs: '@LABRCNTIP'$ (procalcitonin:4,lacticidven:4) )  Recent Results (from the past 240 hour(s))  Culture, blood (Routine X 2) w Reflex to ID Panel     Status: None   Collection Time: 12/21/21  3:06 PM   Specimen: Right Antecubital; Blood  Result Value Ref Range Status   Specimen Description RIGHT ANTECUBITAL  Final   Special Requests   Final    BOTTLES DRAWN AEROBIC AND ANAEROBIC Blood Culture adequate volume   Culture   Final    NO GROWTH 5 DAYS Performed at [facility], [address] [place], [place] [address]    Report Status 12/26/2021 FINAL  Final  Culture, blood (Routine X 2) w Reflex to ID Panel     Status: None   Collection Time: 12/21/21  3:06 PM   Specimen: BLOOD RIGHT HAND  Result Value Ref Range Status   Specimen Description BLOOD RIGHT HAND  Final   Special Requests   Final    BOTTLES DRAWN AEROBIC ONLY Blood Culture adequate volume   Culture   Final    NO GROWTH 5 DAYS Performed at [facility], [address] [place], [place] [address]    Report Status 12/26/2021 FINAL  Final     Scheduled Meds:  amLODipine   10 mg Oral Daily   apixaban  2.5 mg Oral BID   atorvastatin  40 mg Oral QHS   carvedilol  3.125 mg Oral BID WC   feeding supplement  237 mL Oral BID BM   hydrALAZINE  100 mg Oral BID   insulin aspart  0-5 Units Subcutaneous QHS   insulin aspart  0-9 Units Subcutaneous TID WC   polyethylene glycol  17 g Oral Daily   senna  2 tablet Oral Daily   sodium zirconium cyclosilicate  10 g Oral Daily   Continuous Infusions:  Procedures/Studies: US RENAL  Result Date: 12/27/2021 CLINICAL DATA:  Acute kidney insufficiency on admission. EXAM: RENAL / URINARY TRACT ULTRASOUND COMPLETE COMPARISON:  Renal ultrasound 04/05/2020, CT abdomen and pelvis 02/21/2021 FINDINGS: Right Kidney: Renal measurements: 9.7 x 4.7 x 5.4 cm = volume: 129 mL. Increased cortical echogenicity as can be seen with medical renal disease, similar to prior. Lateral right midpole hypoechoic cyst measuring up to 1.1 cm, benign. No suspicious right renal mass is seen. Left Kidney: Limited evaluation of the left kidney due to overlying bowel gas. Renal measurements: Maximally 9.0 x 4.5 x 3.2 cm = volume: Approximately 67 mL. There again appears to be increased echogenicity of the left renal cortex as can be seen with medical renal disease. No gross cyst or solid mass is seen. Bladder: Only mildly distended, limiting evaluation. Other: None. IMPRESSION: 1. Evaluation of the left kidney is limited by overlying bowel gas. No hydronephrosis is seen either side. 2. There is again increased cortical echogenicity as can be seen with medical renal disease. Electronically Signed   By: Yvonne Kendall M.D.   On: 12/27/2021 10:47   DG Chest Portable 1 View  Result Date: 12/16/2021 CLINICAL DATA:  Difficult the rising to standing position. EXAM: PORTABLE CHEST 1 VIEW COMPARISON:  11/10/2021 FINDINGS: Lungs are hypoinflated and otherwise clear. Mild stable cardiomegaly. Remainder of the exam is unchanged. IMPRESSION: Hypoinflation without acute  cardiopulmonary disease. Electronically Signed   By: Marin Olp M.D.   On: 12/16/2021 13:15    Orson Eva, DO  [facility]  If 7PM-7AM, please contact night-coverage [email or web address] Password [email or web address] 12/27/2021, 5:26 PM   LOS: 6 days

## 2021-12-27 NOTE — Care Management Important Message (Signed)
Important Message  Patient Details  Name: Chelsea Meza MRN: [id number] Date of Birth: November 28, 1935   Medicare Important Message Given:  Yes     Tommy Medal 12/27/2021, 12:15 PM

## 2021-12-27 NOTE — Progress Notes (Signed)
Patient ID: [name], female   DOB: [birth year], 86 y.o.   MRN: [id number] S: Initially seen in consultation on 12/23/21 by Dr. Johnney Ou for AKI/CKD stage IV and hyponatremia.  We were consulted to help guide diuretics.  She was admitted after presenting with AMS from SNF and CBG 12.  Started on IVF's without improvement of Scr.  Plan was to resume home lasix dose, however over the weekend she has had no significant improvement of BUN/Cr or serum sodium.  We were re-consulted to further evaluate and manage electrolytes and AKI.  She is in bed with daughter at bedside.  She denies any N/V/D, SOB, orthopnea, or PND.  Ate her entire breakfast and denies any dysgeusia.  O:BP (!) 161/76 (BP Location: Left Arm)   Pulse 69   Temp 97.6 F (36.4 C) (Oral)   Resp 20   Ht '5\' 10"'$  (1.778 m)   Wt 74.5 kg   SpO2 100%   BMI 23.56 kg/m   Intake/Output Summary (Last 24 hours) at 12/27/2021 0854 Last data filed at 12/27/2021 0543 Gross per 24 hour  Intake --  Output 1400 ml  Net -1400 ml   Intake/Output: I/O last 3 completed shifts: In: -  Out: 3200 [Urine:3200]  Intake/Output this shift:  No intake/output data recorded. Weight change:  Gen: NAD CVS:RRR Resp: CTA with poor inspiratory effort Abd:+BS, soft, NT/ND Ext: 1+ ankle edema  Recent Labs  Lab 12/21/21 1207 12/22/21 0405 12/23/21 0323 12/24/21 0629 12/25/21 0617 12/26/21 0858 12/27/21 0424  NA 134* 134* 133* 135 133* 130* 132*  K 4.3 4.9 5.1 5.6* 5.1 5.2* 5.5*  CL 105 107 107 106 106 103 104  CO2 22 18* 20* 23 21* 18* 21*  GLUCOSE 64* 121* 161* 154* 129* 176* 126*  BUN 94* 96* 102* 97* 104* 107* 110*  CREATININE 4.19* 4.09* 4.10* 4.20* 4.13* 4.24* 4.28*  ALBUMIN 2.9*  --  2.3* 2.3* 2.3* 2.4* 2.3*  CALCIUM 8.2* 8.0* 7.7* 8.1* 8.0* 8.2* 8.3*  PHOS  --   --  5.9* 6.4* 6.8* 7.1* 7.2*  AST 22  --   --   --   --   --   --   ALT 21  --   --   --   --   --   --    Liver Function Tests: Recent Labs  Lab 12/21/21 1207  12/23/21 0323 12/25/21 0617 12/26/21 0858 12/27/21 0424  AST 22  --   --   --   --   ALT 21  --   --   --   --   ALKPHOS 46  --   --   --   --   BILITOT 0.7  --   --   --   --   PROT 6.5  --   --   --   --   ALBUMIN 2.9*   < > 2.3* 2.4* 2.3*   < > = values in this interval not displayed.   No results for input(s): "LIPASE", "AMYLASE" in the last 168 hours. No results for input(s): "AMMONIA" in the last 168 hours. CBC: Recent Labs  Lab 12/21/21 1207 12/25/21 0617 12/26/21 0858  WBC 4.7 5.8 5.4  NEUTROABS 3.2  --   --   HGB 10.8* 7.7* 8.6*  HCT 33.4* 23.1* 27.1*  MCV 91.5 90.9 93.8  PLT 234 221 259   Cardiac Enzymes: No results for input(s): "CKTOTAL", "CKMB", "CKMBINDEX", "TROPONINI" in the last 168 hours. CBG:  Recent Labs  Lab 12/26/21 0756 12/26/21 1147 12/26/21 1630 12/26/21 2053 12/27/21 0810  GLUCAP 130* 150* 196* 141* 139*    Iron Studies: No results for input(s): "IRON", "TIBC", "TRANSFERRIN", "FERRITIN" in the last 72 hours. Studies/Results: No results found.  amLODipine  10 mg Oral Daily   apixaban  2.5 mg Oral BID   atorvastatin  40 mg Oral QHS   carvedilol  3.125 mg Oral BID WC   feeding supplement  237 mL Oral BID BM   furosemide  40 mg Oral Daily   hydrALAZINE  100 mg Oral BID   insulin aspart  0-5 Units Subcutaneous QHS   insulin aspart  0-9 Units Subcutaneous TID WC   polyethylene glycol  17 g Oral Daily   senna  2 tablet Oral Daily   sodium zirconium cyclosilicate  10 g Oral Daily    BMET    Component Value Date/Time   NA 132 (L) 12/27/2021 0424   K 5.5 (H) 12/27/2021 0424   CL 104 12/27/2021 0424   CO2 21 (L) 12/27/2021 0424   GLUCOSE 126 (H) 12/27/2021 0424   BUN 110 (H) 12/27/2021 0424   CREATININE 4.28 (H) 12/27/2021 0424   CALCIUM 8.3 (L) 12/27/2021 0424   GFRNONAA 10 (L) 12/27/2021 0424   GFRAA 28 (L) 10/22/2018 0526   CBC    Component Value Date/Time   WBC 5.4 12/26/2021 0858   RBC 2.89 (L) 12/26/2021 0858   HGB 8.6  (L) 12/26/2021 0858   HCT 27.1 (L) 12/26/2021 0858   PLT 259 12/26/2021 0858   MCV 93.8 12/26/2021 0858   MCH 29.8 12/26/2021 0858   MCHC 31.7 12/26/2021 0858   RDW 13.6 12/26/2021 0858   LYMPHSABS 1.2 12/21/2021 1207   MONOABS 0.2 12/21/2021 1207   EOSABS 0.0 12/21/2021 1207   BASOSABS 0.0 12/21/2021 1207     Assessment/Plan:  AKI/CKD stage IV - Longstanding CKD presumably due to DM and HTN.  Was followed by Dr. Theador Hawthorne but not seen since September 2021.  Scr was 2.63 and 24 hour creatinine clearance was 18 mL/min on 12/12/19.  Scr was 2.4-2.65 in 2022 but has been ranging 3.1-3.5 prior to admission which was 4.2.  No significant change in Scr since admission with either IVF's or po lasix.  Marked diuresis of 3.2 liters over the last 48 hours.  Will hold po lasix for now and check FeNa.  She is without uremic symptoms so no urgent indication for dialysis at this time.  She would be a marginal candidate at best given her poor nutritional and functional status.  Discussed different modalities of RRT with Mrs. Reffner and her daughter.   Avoid nephrotoxic medications including NSAIDs and iodinated intravenous contrast exposure unless the latter is absolutely indicated.   Preferred narcotic agents for pain control are hydromorphone, fentanyl, and methadone. Morphine should not be used.  Avoid Baclofen and avoid oral sodium phosphate and magnesium citrate based laxatives / bowel preps.  Continue strict Input and Output monitoring. Will monitor the patient closely with you and intervene or adjust therapy as indicated by changes in clinical status/labs  Hyponatremia - felt to be hypervolemic but has not responded to diuresis.  Will check TSH and cortisol levels.  Asymptomatic. Hyperkalemia - Due to #1.  Continue with lokelma.  Lasix on hold for now as above. MGUS - will recheck SPEP/UPEP as not taken in over 2 years. Anemia of CKD stage IV - Hgb down to 8.6.  Will check iron stores  and transfuse for  Hgb <7.  Will likely require ESA. Atrial fibrillation - rate controlled, per primary Chronic diastolic CHF - ECHO on 6/57/84 concerning for cardiac amyloid. DM type 2 - presented with hypoglycemia.  Plan per primary svc Disposition - came from SNF and not strong enough to go home.  She is a marginal dialysis candidate as above.  Recommend palliative care consult to help set goals/limits of care given advanced age, multiple co-morbidities, and poor nutritional and functional status.  She is currently full code.  Donetta Potts, MD [facility]

## 2021-12-27 NOTE — Progress Notes (Signed)
Pt rested well through out the night without any complaints of pain.

## 2021-12-27 NOTE — Progress Notes (Signed)
Date and time results received: 12/27/21  Test: serum osmo Critical Value: 324  Name of Provider Notified: MD Tat   Awaiting new orders.

## 2021-12-27 NOTE — Progress Notes (Signed)
Physical Therapy Treatment Patient Details Name: Chelsea Meza MRN: [id number] DOB: [birth year] Today's Date: 12/27/2021   History of Present Illness CHONDA BANEY is a 86 year old female with a history of hypertension, hyperlipidemia, diabetes mellitus type 2, CKD stage IV, CHF, atrial fibrillation, hyperlipidemia presenting from [facility] secondary to altered mental status.  The patient was recently hospitalized from 12/16/2021 to 12/17/2021 secondary to acute on chronic renal failure.  She was discharged to [facility] at that time.  She states that she has been asking for bedtime snack around 8 PM the last 2 evenings, but she has not received them.  The patient's daughter called her earlier in the morning on 12/21/2021.  Apparently the patient was confused.  CBG was ultimately checked and she was noted to have a sugar of 12.  The patient was given glucagon by nursing home staff.  Glucose improved to 36.  Upon EMS arrival, her sugar improved to 73.  Nevertheless, the patient was brought to the emergency department for further evaluation and treatment.  The patient denies any fevers, chills, chest pain, shortness breath, cough, hemoptysis, nausea, vomiting, direct abdominal pain, dysuria, hematuria.  She states that her appetite has been pretty good.  In the ED, the patient was afebrile and hemodynamically stable with oxygen saturation 100% room air.  BMP showed sodium 134, potassium 4.3, bicarbonate 22, glucose 64, serum creatinine 4.19.  LFTs were unremarkable.  WBC 4.7, hemoglobin 10.8, platelets 234,000.  EKG showed atrial fibrillation with no ST-T wave changes.  The patient was not actually noted to be hypothermic with a temperature 92.7.  She was placed on a warming blanket.    PT Comments    Patient demonstrates slight improvement for sitting up at bedside with [facility] flat, fair/good return for completing BLE ROM/strengthening exercises except fatigues easily and unable to complete  marching pace while seated at bedside.  Patient had most difficulty completing sit to stands due to BLE weakness and limited to a few steps at bedside before having to sit due to fatigue and generalized weakness.  Patient tolerated sitting up in chair after therapy - nurse aware.  Patient will benefit from continued skilled physical therapy in hospital and recommended venue below to increase strength, balance, endurance for safe ADLs and gait.      Recommendations for follow up therapy are one component of a multi-disciplinary discharge planning process, led by the attending physician.  Recommendations may be updated based on patient status, additional functional criteria and insurance authorization.  Follow Up Recommendations  Skilled nursing-short term rehab (<3 hours/day) Can patient physically be transported by private vehicle: Yes   Assistance Recommended at Discharge Intermittent Supervision/Assistance  Patient can return home with the following A lot of help with walking and/or transfers;A little help with bathing/dressing/bathroom;Assistance with cooking/housework;Help with stairs or ramp for entrance   Equipment Recommendations  None recommended by PT    Recommendations for Other Services       Precautions / Restrictions Precautions Precautions: Fall Restrictions Weight Bearing Restrictions: No     Mobility  Bed Mobility Overal bed mobility: Needs Assistance Bed Mobility: Supine to Sit     Supine to sit: Min guard, Supervision     General bed mobility comments: labored movement, requires less assistance, HOB flat    Transfers Overall transfer level: Needs assistance Equipment used: Rolling walker (2 wheels) Transfers: Sit to/from Stand, Bed to chair/wheelchair/BSC Sit to Stand: Mod assist   Step pivot transfers: Min assist, Mod assist  General transfer comment: required repeated attempts before able to complete sit to stand due to BLE weakness, poor  standing balance    Ambulation/Gait Ambulation/Gait assistance: Mod assist Gait Distance (Feet): 5 Feet Assistive device: Rolling walker (2 wheels) Gait Pattern/deviations: Decreased step length - right, Decreased step length - left, Decreased stride length Gait velocity: decreased     General Gait Details: limited to a few steps at bedside before having to sit due poor standing balance and generalized weakness   Stairs             Wheelchair Mobility    Modified Rankin (Stroke Patients Only)       Balance Overall balance assessment: Needs assistance Sitting-balance support: Feet supported, No upper extremity supported Sitting balance-Leahy Scale: Fair Sitting balance - Comments: fair/good seated at EOB   Standing balance support: During functional activity, Bilateral upper extremity supported, Reliant on assistive device for balance Standing balance-Leahy Scale: Poor Standing balance comment: fair/poor using RW                            Cognition Arousal/Alertness: Awake/alert Behavior During Therapy: WFL for tasks assessed/performed Overall Cognitive Status: Within Functional Limits for tasks assessed                                          Exercises General Exercises - Lower Extremity Long Arc Quad: Seated, AROM, Strengthening, Both, 10 reps Toe Raises: Seated, AROM, Strengthening, Both, 10 reps Heel Raises: Seated, AROM, Strengthening, Both, 10 reps    General Comments        Pertinent Vitals/Pain Pain Assessment Pain Assessment: No/denies pain    Home Living                          Prior Function            PT Goals (current goals can now be found in the care plan section) Acute Rehab PT Goals Patient Stated Goal: return home after rehab PT Goal Formulation: With patient/family Time For Goal Achievement: 01/05/22 Potential to Achieve Goals: Good Progress towards PT goals: Progressing toward  goals    Frequency    Min 3X/week      PT Plan Current plan remains appropriate    Co-evaluation              AM-PAC PT "6 Clicks" Mobility   Outcome Measure  Help needed turning from your back to your side while in a flat bed without using bedrails?: None Help needed moving from lying on your back to sitting on the side of a flat bed without using bedrails?: A Little Help needed moving to and from a bed to a chair (including a wheelchair)?: A Lot Help needed standing up from a chair using your arms (e.g., wheelchair or bedside chair)?: A Lot Help needed to walk in hospital room?: A Lot Help needed climbing 3-5 steps with a railing? : A Lot 6 Click Score: 15    End of Session   Activity Tolerance: Patient tolerated treatment well;Patient limited by fatigue Patient left: in chair;with call bell/phone within reach Nurse Communication: Mobility status PT Visit Diagnosis: Unsteadiness on feet (R26.81);Other abnormalities of gait and mobility (R26.89);Muscle weakness (generalized) (M62.81)     Time: 1120-1140 PT Time Calculation (min) (ACUTE ONLY): 20 min  Charges:  $Therapeutic Exercise: 8-22 mins $Therapeutic Activity: 8-22 mins                     2:28 PM, 12/27/21 [name], MPT Physical Therapist with [facility] [phone] office [phone] mobile phone

## 2021-12-28 DIAGNOSIS — I4891 Unspecified atrial fibrillation: Secondary | ICD-10-CM | POA: Diagnosis not present

## 2021-12-28 DIAGNOSIS — I10 Essential (primary) hypertension: Secondary | ICD-10-CM | POA: Diagnosis not present

## 2021-12-28 DIAGNOSIS — N179 Acute kidney failure, unspecified: Secondary | ICD-10-CM | POA: Diagnosis not present

## 2021-12-28 DIAGNOSIS — I5032 Chronic diastolic (congestive) heart failure: Secondary | ICD-10-CM | POA: Diagnosis not present

## 2021-12-28 LAB — GLUCOSE, CAPILLARY
Glucose-Capillary: 140 mg/dL — ABNORMAL HIGH (ref 70–99)
Glucose-Capillary: 213 mg/dL — ABNORMAL HIGH (ref 70–99)
Glucose-Capillary: 202 mg/dL — ABNORMAL HIGH (ref 70–99)
Glucose-Capillary: 214 mg/dL — ABNORMAL HIGH (ref 70–99)

## 2021-12-28 LAB — CBC
HCT: 25.9 % — ABNORMAL LOW (ref 36.0–46.0)
Hemoglobin: 8.2 g/dL — ABNORMAL LOW (ref 12.0–15.0)
MCH: 29.8 pg (ref 26.0–34.0)
MCHC: 31.7 g/dL (ref 30.0–36.0)
MCV: 94.2 fL (ref 80.0–100.0)
Platelets: 226 10*3/uL (ref 150–400)
RBC: 2.75 MIL/uL — ABNORMAL LOW (ref 3.87–5.11)
RDW: 13.7 % (ref 11.5–15.5)
WBC: 5.9 10*3/uL (ref 4.0–10.5)
nRBC: 0 % (ref 0.0–0.2)

## 2021-12-28 LAB — RENAL FUNCTION PANEL
Albumin: 2.1 g/dL — ABNORMAL LOW (ref 3.5–5.0)
Anion gap: 7 (ref 5–15)
BUN: 108 mg/dL — ABNORMAL HIGH (ref 8–23)
CO2: 21 mmol/L — ABNORMAL LOW (ref 22–32)
Calcium: 8.1 mg/dL — ABNORMAL LOW (ref 8.9–10.3)
Chloride: 103 mmol/L (ref 98–111)
Creatinine, Ser: 4.36 mg/dL — ABNORMAL HIGH (ref 0.44–1.00)
GFR, Estimated: 9 mL/min — ABNORMAL LOW (ref >60)
Glucose, Bld: 151 mg/dL — ABNORMAL HIGH (ref 70–99)
Phosphorus: 7.5 mg/dL — ABNORMAL HIGH (ref 2.5–4.6)
Potassium: 5.6 mmol/L — ABNORMAL HIGH (ref 3.5–5.1)
Sodium: 131 mmol/L — ABNORMAL LOW (ref 135–145)

## 2021-12-28 LAB — T4, FREE: Free T4: 0.78 ng/dL (ref 0.61–1.12)

## 2021-12-28 LAB — KAPPA/LAMBDA LIGHT CHAINS
Kappa free light chain: 449.4 mg/L — ABNORMAL HIGH (ref 3.3–19.4)
Kappa, lambda light chain ratio: 7.37 — ABNORMAL HIGH (ref 0.26–1.65)
Lambda free light chains: 61 mg/L — ABNORMAL HIGH (ref 5.7–26.3)

## 2021-12-28 MED ORDER — SODIUM ZIRCONIUM CYCLOSILICATE 10 G PO PACK
10.0000 g | PACK | Freq: Two times a day (BID) | ORAL | Status: DC
  Administered 2021-12-28 – 2022-01-04 (×13): 10 g via ORAL
  Filled 2021-12-28 (×14): qty 1

## 2021-12-28 NOTE — Progress Notes (Signed)
Physical Therapy Treatment Patient Details Name: Chelsea Meza MRN: [id number] DOB: [birth year] Today's Date: 12/28/2021   History of Present Illness Chelsea Meza is a 86 year old female with a history of hypertension, hyperlipidemia, diabetes mellitus type 2, CKD stage IV, CHF, atrial fibrillation, hyperlipidemia presenting from [facility] secondary to altered mental status.  The patient was recently hospitalized from 12/16/2021 to 12/17/2021 secondary to acute on chronic renal failure.  She was discharged to [facility] at that time.  She states that she has been asking for bedtime snack around 8 PM the last 2 evenings, but she has not received them.  The patient's daughter called her earlier in the morning on 12/21/2021.  Apparently the patient was confused.  CBG was ultimately checked and she was noted to have a sugar of 12.  The patient was given glucagon by nursing home staff.  Glucose improved to 36.  Upon EMS arrival, her sugar improved to 73.  Nevertheless, the patient was brought to the emergency department for further evaluation and treatment.  The patient denies any fevers, chills, chest pain, shortness breath, cough, hemoptysis, nausea, vomiting, direct abdominal pain, dysuria, hematuria.  She states that her appetite has been pretty good.  In the ED, the patient was afebrile and hemodynamically stable with oxygen saturation 100% room air.  BMP showed sodium 134, potassium 4.3, bicarbonate 22, glucose 64, serum creatinine 4.19.  LFTs were unremarkable.  WBC 4.7, hemoglobin 10.8, platelets 234,000.  EKG showed atrial fibrillation with no ST-T wave changes.  The patient was not actually noted to be hypothermic with a temperature 92.7.  She was placed on a warming blanket.    PT Comments    Pt friendly and willing to participate with therapy today.  Presents with generalized weakness and fatigue with activities.  Improved bed mobility with only verbal cueing required to assist with  supine to sit, did require side bar to assist to sitting.  Cueing for hand and foot placement to assist with sit to stand that was difficult for pt, elevated bed height to assist with mod A.  Decreased balance upon standing, used RW for safety.  Pt able to ambulate with chair safely and safe mechanics to sitting.  Attempted another round of gait though pt was too fatigue.  EOS pt left in chair with call bell within reach and chair alarm set.    Recommendations for follow up therapy are one component of a multi-disciplinary discharge planning process, led by the attending physician.  Recommendations may be updated based on patient status, additional functional criteria and insurance authorization.  Follow Up Recommendations        Assistance Recommended at Discharge    Patient can return home with the following     Equipment Recommendations       Recommendations for Other Services       Precautions / Restrictions Precautions Precautions: Fall Restrictions Weight Bearing Restrictions: No     Mobility  Bed Mobility Overal bed mobility: Modified Independent       Supine to sit: Min guard     General bed mobility comments: cueing for arm and leg movements to assist with bed mobilty, required side bar to pull to assist with supine to sit    Transfers Overall transfer level: Needs assistance Equipment used: Rolling walker (2 wheels) Transfers: Sit to/from Stand Sit to Stand: Mod assist, From elevated surface           General transfer comment: required repeated attempts before able  to complete sit to stand due to BLE weakness, poor standing balance, elevated height of bed for increased ease, cueing for hand placement to assist with safe sit to stand    Ambulation/Gait Ambulation/Gait assistance: Min assist Gait Distance (Feet): 8 Feet Assistive device: Rolling walker (2 wheels) Gait Pattern/deviations: Decreased step length - right, Decreased step length - left,  Decreased stride length       General Gait Details: Cueing to stand within walker, able to walk to chair wiht no LOB, limited by fatigue and generalized weakness   Stairs             Wheelchair Mobility    Modified Rankin (Stroke Patients Only)       Balance                                            Cognition Arousal/Alertness: Awake/alert Behavior During Therapy: WFL for tasks assessed/performed Overall Cognitive Status: Within Functional Limits for tasks assessed                                          Exercises General Exercises - Lower Extremity Ankle Circles/Pumps: AROM, 10 reps, Supine Long Arc Quad: Seated, AROM, Strengthening, Both, 10 reps Heel Slides: Both, Supine, 10 reps Hip ABduction/ADduction: AROM, Strengthening, Both, 10 reps, Seated Hip Flexion/Marching: Both, 10 reps, Seated Toe Raises: Seated, AROM, Strengthening, Both, 10 reps Heel Raises: Seated, AROM, Strengthening, Both, 10 reps Other Exercises Other Exercises: LTR 10x both directions to assist with mobility/stiffness    General Comments        Pertinent Vitals/Pain Pain Assessment Pain Assessment: No/denies pain (c/o stiffness)    Home Living                          Prior Function            PT Goals (current goals can now be found in the care plan section)      Frequency           PT Plan Current plan remains appropriate    Co-evaluation              AM-PAC PT "6 Clicks" Mobility   Outcome Measure  Help needed turning from your back to your side while in a flat bed without using bedrails?: None Help needed moving from lying on your back to sitting on the side of a flat bed without using bedrails?: A Little Help needed moving to and from a bed to a chair (including a wheelchair)?: A Little Help needed standing up from a chair using your arms (e.g., wheelchair or bedside chair)?: A Lot Help needed to walk in  hospital room?: A Lot Help needed climbing 3-5 steps with a railing? : A Lot 6 Click Score: 16    End of Session Equipment Utilized During Treatment: Gait belt Activity Tolerance: Patient tolerated treatment well;Patient limited by fatigue Patient left: in chair;with call bell/phone within reach;with chair alarm set Nurse Communication: Mobility status PT Visit Diagnosis: Unsteadiness on feet (R26.81);Other abnormalities of gait and mobility (R26.89);Muscle weakness (generalized) (M62.81)     Time: 1340-1415 PT Time Calculation (min) (ACUTE ONLY): 35 min  Charges:  $Therapeutic Exercise: 8-22 mins $Therapeutic Activity: 8-22 mins  Ihor Austin, LPTA/CLT; CBIS [phone]   Aldona Lento 12/28/2021, 2:31 PM

## 2021-12-28 NOTE — Progress Notes (Signed)
PROGRESS NOTE  Chelsea Meza OAC:[id number] DOB: [birth year] DOA: 12/21/2021 PCP: Hal Morales, DO  Brief History:  86 year old female with a history of hypertension, hyperlipidemia, diabetes mellitus type 2, CKD stage IV, CHF, atrial fibrillation, hyperlipidemia presenting from [facility] secondary to altered mental status.  The patient was recently hospitalized from 12/16/2021 to 12/17/2021 secondary to acute on chronic renal failure.  She was discharged to [facility] at that time.  She states that she has been asking for bedtime snack around 8 PM the last 2 evenings, but she has not received them.  The patient's daughter called her earlier in the morning on 12/21/2021.  Apparently the patient was confused.  CBG was ultimately checked and she was noted to have a sugar of 12.  The patient was given glucagon by nursing home staff.  Glucose improved to 36.  Upon EMS arrival, her sugar improved to 73.  Nevertheless, the patient was brought to the emergency department for further evaluation and treatment.  The patient denies any fevers, chills, chest pain, shortness breath, cough, hemoptysis, nausea, vomiting, direct abdominal pain, dysuria, hematuria.  She states that her appetite has been pretty good. In the ED, the patient was afebrile and hemodynamically stable with oxygen saturation 100% room air.  BMP showed sodium 134, potassium 4.3, bicarbonate 22, glucose 64, serum creatinine 4.19.  LFTs were unremarkable.  WBC 4.7, hemoglobin 10.8, platelets 234,000.  EKG showed atrial fibrillation with no ST-T wave changes.  The patient was not actually noted to be hypothermic with a temperature 92.7.  She was placed on a warming blanket. Prolonged hospitalization due to slow renal recovery.  Nephrology consulted and following     Assessment and Plan: * Acute renal failure superimposed on stage 4 chronic kidney disease, unspecified acute renal failure type ([place]) Secondary to volume  depletion and hemodynamic changes Presented with serum creatinine 4.19 Baseline creatinine 3.0-3.3 D/c IVF, pt on hypervolemic side Not much improvement of renal function, likely progression of CKD Renal consult appreciated Discussed with pt and daughter about HD>>noncommital but likely would agree when time arrives or if sense of urgency >>serum creatinine largely stable after starting home lasix, but now slowly rising again   A-fib [facility]) Type unspecified Rate controlled Continue apixaban Continue coreg  Essential hypertension Continue amlodipine, carvedilol, hydralazine  Hyperkalemia Increase lokelma to bid Repeat BMP in am There has been some dietary indiscretion  Hypothermia Presented with temperature 92.7 Check cortisol level--16.6 12/16/2021 TSH 6.574, free T4--1.22 Check PCT--0.50 Blood culture x2 sets--neg to date -improved  Chronic diastolic CHF (congestive heart failure) ([place]) Home lasix dose restarted 12/23/21 10/27/2021 echo EF 60 to 65%, no WMA, trivial MR  Hypoglycemia associated with type 2 diabetes mellitus ([place]) Suspect variable oral intake in the setting of long-acting Lantus and short acting insulin with worsen renal funciton  Received Humalog 6 units 3 times daily at SNF Holding Lantus NovoLog sliding scale--sensitive scale 10/26/2021 hemoglobin A1c 6.7 No further hypoglycemic episodes  Hyperlipidemia Continue statin  Goals of care -palliative care consultd    Family Communication:   daughter updated at bedside 8/28   Consultants:  nephrology, palliative care   Code Status:  FULL    DVT Prophylaxis:  apixaban     Procedures: As Listed in Progress Note Above   Antibiotics: None           Subjective: Denies any shortness of breath, nausea or vomiting   Objective: Vitals:  12/28/21 0843 12/28/21 1337 12/28/21 1629 12/28/21 2030  BP:  (!) 142/72 (!) 141/73 (!) 148/73  Pulse: 91 73  79  Resp:  16  20  Temp:  98 F (36.7  C)  97.9 F (36.6 C)  TempSrc:  Oral  Oral  SpO2:  100%  100%  Weight:      Height:       No intake or output data in the 24 hours ending 12/28/21 2313  Weight change:  Exam:  General:  Pt is alert, follows commands appropriately, not in acute distress HEENT: No icterus, No thrush, No neck mass, [place]/AT Cardiovascular: IRRR, S1/S2, no rubs, no gallops Respiratory: bibasilar crackles. No wheeze Abdomen: Soft/+BS, non tender, non distended, no guarding Extremities: 1+LE  edema, No lymphangitis, No petechiae, No rashes, no synovitis   Data Reviewed: I have personally reviewed following labs and imaging studies Basic Metabolic Panel: Recent Labs  Lab 12/24/21 0629 12/25/21 0617 12/26/21 0858 12/27/21 0424 12/28/21 0346  NA 135 133* 130* 132* 131*  K 5.6* 5.1 5.2* 5.5* 5.6*  CL 106 106 103 104 103  CO2 23 21* 18* 21* 21*  GLUCOSE 154* 129* 176* 126* 151*  BUN 97* 104* 107* 110* 108*  CREATININE 4.20* 4.13* 4.24* 4.28* 4.36*  CALCIUM 8.1* 8.0* 8.2* 8.3* 8.1*  PHOS 6.4* 6.8* 7.1* 7.2* 7.5*   Liver Function Tests: Recent Labs  Lab 12/24/21 0629 12/25/21 0617 12/26/21 0858 12/27/21 0424 12/28/21 0346  ALBUMIN 2.3* 2.3* 2.4* 2.3* 2.1*   No results for input(s): "LIPASE", "AMYLASE" in the last 168 hours. No results for input(s): "AMMONIA" in the last 168 hours. Coagulation Profile: No results for input(s): "INR", "PROTIME" in the last 168 hours. CBC: Recent Labs  Lab 12/25/21 0617 12/26/21 0858 12/28/21 0905  WBC 5.8 5.4 5.9  HGB 7.7* 8.6* 8.2*  HCT 23.1* 27.1* 25.9*  MCV 90.9 93.8 94.2  PLT 221 259 226   Cardiac Enzymes: No results for input(s): "CKTOTAL", "CKMB", "CKMBINDEX", "TROPONINI" in the last 168 hours. BNP: Invalid input(s): "POCBNP" CBG: Recent Labs  Lab 12/27/21 2100 12/28/21 0741 12/28/21 1103 12/28/21 1604 12/28/21 2034  GLUCAP 218* 140* 213* 202* 214*   HbA1C: No results for input(s): "HGBA1C" in the last 72 hours. Urine analysis:     Component Value Date/Time   COLORURINE YELLOW 12/27/2021 0810   APPEARANCEUR CLEAR 12/27/2021 0810   LABSPEC 1.010 12/27/2021 0810   PHURINE 5.0 12/27/2021 0810   GLUCOSEU NEGATIVE 12/27/2021 0810   HGBUR NEGATIVE 12/27/2021 0810   BILIRUBINUR NEGATIVE 12/27/2021 0810   KETONESUR NEGATIVE 12/27/2021 0810   PROTEINUR 100 (A) 12/27/2021 0810   UROBILINOGEN 0.2 07/13/2012 1236   NITRITE NEGATIVE 12/27/2021 0810   LEUKOCYTESUR NEGATIVE 12/27/2021 0810   Sepsis Labs: '@LABRCNTIP'$ (procalcitonin:4,lacticidven:4) ) Recent Results (from the past 240 hour(s))  Culture, blood (Routine X 2) w Reflex to ID Panel     Status: None   Collection Time: 12/21/21  3:06 PM   Specimen: Right Antecubital; Blood  Result Value Ref Range Status   Specimen Description RIGHT ANTECUBITAL  Final   Special Requests   Final    BOTTLES DRAWN AEROBIC AND ANAEROBIC Blood Culture adequate volume   Culture   Final    NO GROWTH 5 DAYS Performed at [facility], [address] [place], [place] [address]    Report Status 12/26/2021 FINAL  Final  Culture, blood (Routine X 2) w Reflex to ID Panel     Status: None   Collection Time: 12/21/21  3:06 PM   Specimen: BLOOD RIGHT HAND  Result Value Ref Range Status   Specimen Description BLOOD RIGHT HAND  Final   Special Requests   Final    BOTTLES DRAWN AEROBIC ONLY Blood Culture adequate volume   Culture   Final    NO GROWTH 5 DAYS Performed at [facility], [address], [place],  [address]    Report Status 12/26/2021 FINAL  Final     Scheduled Meds:  amLODipine  10 mg Oral Daily   apixaban  2.5 mg Oral BID   atorvastatin  40 mg Oral QHS   carvedilol  3.125 mg Oral BID WC   feeding supplement  237 mL Oral BID BM   hydrALAZINE  100 mg Oral BID   insulin aspart  0-5 Units Subcutaneous QHS   insulin aspart  0-9 Units Subcutaneous TID WC   polyethylene glycol  17 g Oral Daily   senna  2 tablet Oral Daily   sodium zirconium cyclosilicate  10 g Oral BID    Continuous Infusions:  Procedures/Studies: US RENAL  Result Date: 12/27/2021 CLINICAL DATA:  Acute kidney insufficiency on admission. EXAM: RENAL / URINARY TRACT ULTRASOUND COMPLETE COMPARISON:  Renal ultrasound 04/05/2020, CT abdomen and pelvis 02/21/2021 FINDINGS: Right Kidney: Renal measurements: 9.7 x 4.7 x 5.4 cm = volume: 129 mL. Increased cortical echogenicity as can be seen with medical renal disease, similar to prior. Lateral right midpole hypoechoic cyst measuring up to 1.1 cm, benign. No suspicious right renal mass is seen. Left Kidney: Limited evaluation of the left kidney due to overlying bowel gas. Renal measurements: Maximally 9.0 x 4.5 x 3.2 cm = volume: Approximately 67 mL. There again appears to be increased echogenicity of the left renal cortex as can be seen with medical renal disease. No gross cyst or solid mass is seen. Bladder: Only mildly distended, limiting evaluation. Other: None. IMPRESSION: 1. Evaluation of the left kidney is limited by overlying bowel gas. No hydronephrosis is seen either side. 2. There is again increased cortical echogenicity as can be seen with medical renal disease. Electronically Signed   By: Yvonne Kendall M.D.   On: 12/27/2021 10:47   DG Chest Portable 1 View  Result Date: 12/16/2021 CLINICAL DATA:  Difficult the rising to standing position. EXAM: PORTABLE CHEST 1 VIEW COMPARISON:  11/10/2021 FINDINGS: Lungs are hypoinflated and otherwise clear. Mild stable cardiomegaly. Remainder of the exam is unchanged. IMPRESSION: Hypoinflation without acute cardiopulmonary disease. Electronically Signed   By: Marin Olp M.D.   On: 12/16/2021 13:15    Kathie Dike, MD  [facility]  If 7PM-7AM, please contact night-coverage [email or web address] 12/28/2021, 11:13 PM   LOS: 7 days

## 2021-12-28 NOTE — Progress Notes (Addendum)
Palliative-   Consult received, chart reviewed. Family was not at bedside.  Called patient's daughter- Chelsea Meza- plan to meet tomorrow 8/31 at [place], AGNP-C Palliative Medicine  Please call Palliative Medicine team phone with any questions [phone]. For individual providers please see AMION.  No charge

## 2021-12-28 NOTE — Progress Notes (Signed)
Patient ID: Chelsea Meza, female   DOB: [birth year], 86 y.o.   MRN: [id number] S: Feels well this morning.  No events overnight. O:BP (!) 163/89 (BP Location: Left Arm)   Pulse 73   Temp 98.1 F (36.7 C) (Oral)   Resp 17   Ht '5\' 10"'$  (1.778 m)   Wt 74.5 kg   SpO2 100%   BMI 23.56 kg/m   Intake/Output Summary (Last 24 hours) at 12/28/2021 0832 Last data filed at 12/27/2021 2100 Gross per 24 hour  Intake 720 ml  Output 450 ml  Net 270 ml   Intake/Output: I/O last 3 completed shifts: In: 720 [P.O.:720] Out: 1850 [Urine:1850]  Intake/Output this shift:  No intake/output data recorded. Weight change:  Gen: frail, chronically ill-appearing, NAD CVS:RRR Resp: CTA Abd: +BS, soft, NT/ND Ext: 1+ ankle edema  Recent Labs  Lab 12/21/21 1207 12/22/21 0405 12/23/21 0323 12/24/21 0629 12/25/21 0617 12/26/21 0858 12/27/21 0424 12/28/21 0346  NA 134* 134* 133* 135 133* 130* 132* 131*  K 4.3 4.9 5.1 5.6* 5.1 5.2* 5.5* 5.6*  CL 105 107 107 106 106 103 104 103  CO2 22 18* 20* 23 21* 18* 21* 21*  GLUCOSE 64* 121* 161* 154* 129* 176* 126* 151*  BUN 94* 96* 102* 97* 104* 107* 110* 108*  CREATININE 4.19* 4.09* 4.10* 4.20* 4.13* 4.24* 4.28* 4.36*  ALBUMIN 2.9*  --  2.3* 2.3* 2.3* 2.4* 2.3* 2.1*  CALCIUM 8.2* 8.0* 7.7* 8.1* 8.0* 8.2* 8.3* 8.1*  PHOS  --   --  5.9* 6.4* 6.8* 7.1* 7.2* 7.5*  AST 22  --   --   --   --   --   --   --   ALT 21  --   --   --   --   --   --   --    Liver Function Tests: Recent Labs  Lab 12/21/21 1207 12/23/21 0323 12/26/21 0858 12/27/21 0424 12/28/21 0346  AST 22  --   --   --   --   ALT 21  --   --   --   --   ALKPHOS 46  --   --   --   --   BILITOT 0.7  --   --   --   --   PROT 6.5  --   --   --   --   ALBUMIN 2.9*   < > 2.4* 2.3* 2.1*   < > = values in this interval not displayed.   No results for input(s): "LIPASE", "AMYLASE" in the last 168 hours. No results for input(s): "AMMONIA" in the last 168 hours. CBC: Recent Labs  Lab  12/21/21 1207 12/25/21 0617 12/26/21 0858  WBC 4.7 5.8 5.4  NEUTROABS 3.2  --   --   HGB 10.8* 7.7* 8.6*  HCT 33.4* 23.1* 27.1*  MCV 91.5 90.9 93.8  PLT 234 221 259   Cardiac Enzymes: No results for input(s): "CKTOTAL", "CKMB", "CKMBINDEX", "TROPONINI" in the last 168 hours. CBG: Recent Labs  Lab 12/27/21 0810 12/27/21 1123 12/27/21 1630 12/27/21 2100 12/28/21 0741  GLUCAP 139* 189* 266* 218* 140*    Iron Studies:  Recent Labs    12/26/21 0857  IRON 63  TIBC 227*   Studies/Results: US RENAL  Result Date: 12/27/2021 CLINICAL DATA:  Acute kidney insufficiency on admission. EXAM: RENAL / URINARY TRACT ULTRASOUND COMPLETE COMPARISON:  Renal ultrasound 04/05/2020, CT abdomen and pelvis 02/21/2021 FINDINGS: Right Kidney: Renal measurements: 9.7  x 4.7 x 5.4 cm = volume: 129 mL. Increased cortical echogenicity as can be seen with medical renal disease, similar to prior. Lateral right midpole hypoechoic cyst measuring up to 1.1 cm, benign. No suspicious right renal mass is seen. Left Kidney: Limited evaluation of the left kidney due to overlying bowel gas. Renal measurements: Maximally 9.0 x 4.5 x 3.2 cm = volume: Approximately 67 mL. There again appears to be increased echogenicity of the left renal cortex as can be seen with medical renal disease. No gross cyst or solid mass is seen. Bladder: Only mildly distended, limiting evaluation. Other: None. IMPRESSION: 1. Evaluation of the left kidney is limited by overlying bowel gas. No hydronephrosis is seen either side. 2. There is again increased cortical echogenicity as can be seen with medical renal disease. Electronically Signed   By: Yvonne Kendall M.D.   On: 12/27/2021 10:47    amLODipine  10 mg Oral Daily   apixaban  2.5 mg Oral BID   atorvastatin  40 mg Oral QHS   carvedilol  3.125 mg Oral BID WC   feeding supplement  237 mL Oral BID BM   hydrALAZINE  100 mg Oral BID   insulin aspart  0-5 Units Subcutaneous QHS   insulin aspart   0-9 Units Subcutaneous TID WC   polyethylene glycol  17 g Oral Daily   senna  2 tablet Oral Daily   sodium zirconium cyclosilicate  10 g Oral Daily    BMET    Component Value Date/Time   NA 131 (L) 12/28/2021 0346   K 5.6 (H) 12/28/2021 0346   CL 103 12/28/2021 0346   CO2 21 (L) 12/28/2021 0346   GLUCOSE 151 (H) 12/28/2021 0346   BUN 108 (H) 12/28/2021 0346   CREATININE 4.36 (H) 12/28/2021 0346   CALCIUM 8.1 (L) 12/28/2021 0346   GFRNONAA 9 (L) 12/28/2021 0346   GFRAA 28 (L) 10/22/2018 0526   CBC    Component Value Date/Time   WBC 5.4 12/26/2021 0858   RBC 2.89 (L) 12/26/2021 0858   HGB 8.6 (L) 12/26/2021 0858   HCT 27.1 (L) 12/26/2021 0858   PLT 259 12/26/2021 0858   MCV 93.8 12/26/2021 0858   MCH 29.8 12/26/2021 0858   MCHC 31.7 12/26/2021 0858   RDW 13.6 12/26/2021 0858   LYMPHSABS 1.2 12/21/2021 1207   MONOABS 0.2 12/21/2021 1207   EOSABS 0.0 12/21/2021 1207   BASOSABS 0.0 12/21/2021 1207    Assessment/Plan:   AKI/CKD stage IV - Longstanding CKD presumably due to DM and HTN.  Was followed by Dr. Theador Hawthorne but not seen since September 2021.  Scr was 2.63 and 24 hour creatinine clearance was 18 mL/min on 12/12/19.  Scr was 2.4-2.65 in 2022 but has been ranging 3.1-3.5 prior to admission which was 4.2.  No significant change in Scr since admission with either IVF's or po lasix.  Marked diuresis of 3.2 liters over the last 48 hours.  Lasix on hold since 12/27/21 and hold again today.  FeNa consistent with ATN.  Should be able to resume home lasix dose tomorrow.  She is without uremic symptoms so no urgent indication for dialysis at this time.  She would be a marginal candidate at best given her poor nutritional and functional status.  Discussed different modalities of RRT with Chelsea Meza and her daughter.  Her daughter would likely want to proceed, although not sure Chelsea Meza understands the impact of HD on her quality of life and functional status.  Avoid nephrotoxic  medications including NSAIDs and iodinated intravenous contrast exposure unless the latter is absolutely indicated.   Preferred narcotic agents for pain control are hydromorphone, fentanyl, and methadone. Morphine should not be used.  Avoid Baclofen and avoid oral sodium phosphate and magnesium citrate based laxatives / bowel preps.  Continue strict Input and Output monitoring. Will monitor the patient closely with you and intervene or adjust therapy as indicated by changes in clinical status/labs  Hyponatremia - felt to be hypervolemic but has not responded to diuresis.  Asymptomatic.  Cortisol WNL, TSH elevated at 9.33, Uosm 342, UNa 38, Sosm 324.  BNP nl at 87 (improved from 870).  Free T3 and T4 pending but may need to start replacement therapy if they are also low.  Continue to follow.  Hyperkalemia - Due to #1.  Continue with lokelma but increase dose to bid.  Lasix on hold for now as above. MGUS - SPEP/UPEP pending (has not been checked in over 2 years). Anemia of CKD stage IV - Hgb down to 8.6.  Will check iron stores and transfuse for Hgb <7.  Will likely require ESA. Atrial fibrillation - rate controlled, per primary Chronic diastolic CHF - ECHO on 1/96/22 concerning for cardiac amyloid.  Has never seen a Cardiologist. DM type 2 - presented with hypoglycemia.  Plan per primary svc Disposition - came from SNF and not strong enough to go home.  She is a marginal dialysis candidate as above.  Recommend palliative care consult to help set goals/limits of care given advanced age, multiple co-morbidities, and poor nutritional and functional status.  She is currently full code.  Donetta Potts, MD [facility]

## 2021-12-29 DIAGNOSIS — Z7189 Other specified counseling: Secondary | ICD-10-CM

## 2021-12-29 DIAGNOSIS — I10 Essential (primary) hypertension: Secondary | ICD-10-CM | POA: Diagnosis not present

## 2021-12-29 DIAGNOSIS — I5032 Chronic diastolic (congestive) heart failure: Secondary | ICD-10-CM | POA: Diagnosis not present

## 2021-12-29 DIAGNOSIS — N179 Acute kidney failure, unspecified: Secondary | ICD-10-CM | POA: Diagnosis not present

## 2021-12-29 DIAGNOSIS — I4891 Unspecified atrial fibrillation: Secondary | ICD-10-CM

## 2021-12-29 LAB — CBC
HCT: 22.8 % — ABNORMAL LOW (ref 36.0–46.0)
Hemoglobin: 7.5 g/dL — ABNORMAL LOW (ref 12.0–15.0)
MCH: 29.5 pg (ref 26.0–34.0)
MCHC: 32.9 g/dL (ref 30.0–36.0)
MCV: 89.8 fL (ref 80.0–100.0)
Platelets: 266 10*3/uL (ref 150–400)
RBC: 2.54 MIL/uL — ABNORMAL LOW (ref 3.87–5.11)
RDW: 13.6 % (ref 11.5–15.5)
WBC: 5.8 10*3/uL (ref 4.0–10.5)
nRBC: 0 % (ref 0.0–0.2)

## 2021-12-29 LAB — RENAL FUNCTION PANEL
Albumin: 2.3 g/dL — ABNORMAL LOW (ref 3.5–5.0)
Anion gap: 10 (ref 5–15)
BUN: 116 mg/dL — ABNORMAL HIGH (ref 8–23)
CO2: 20 mmol/L — ABNORMAL LOW (ref 22–32)
Calcium: 8 mg/dL — ABNORMAL LOW (ref 8.9–10.3)
Chloride: 100 mmol/L (ref 98–111)
Creatinine, Ser: 4.61 mg/dL — ABNORMAL HIGH (ref 0.44–1.00)
GFR, Estimated: 9 mL/min — ABNORMAL LOW (ref >60)
Glucose, Bld: 172 mg/dL — ABNORMAL HIGH (ref 70–99)
Phosphorus: 8 mg/dL — ABNORMAL HIGH (ref 2.5–4.6)
Potassium: 5.4 mmol/L — ABNORMAL HIGH (ref 3.5–5.1)
Sodium: 130 mmol/L — ABNORMAL LOW (ref 135–145)

## 2021-12-29 LAB — T3, FREE: T3, Free: 1 pg/mL — ABNORMAL LOW (ref 2.0–4.4)

## 2021-12-29 LAB — GLUCOSE, CAPILLARY
Glucose-Capillary: 150 mg/dL — ABNORMAL HIGH (ref 70–99)
Glucose-Capillary: 160 mg/dL — ABNORMAL HIGH (ref 70–99)
Glucose-Capillary: 174 mg/dL — ABNORMAL HIGH (ref 70–99)

## 2021-12-29 MED ORDER — POLYVINYL ALCOHOL 1.4 % OP SOLN
1.0000 [drp] | Freq: Three times a day (TID) | OPHTHALMIC | Status: DC
  Administered 2021-12-29 – 2022-01-08 (×29): 1 [drp] via OPHTHALMIC
  Filled 2021-12-29 (×2): qty 15

## 2021-12-29 MED ORDER — DARBEPOETIN ALFA 200 MCG/0.4ML IJ SOSY
PREFILLED_SYRINGE | INTRAMUSCULAR | Status: AC
  Filled 2021-12-29: qty 0.4

## 2021-12-29 MED ORDER — DARBEPOETIN ALFA 200 MCG/0.4ML IJ SOSY
200.0000 ug | PREFILLED_SYRINGE | INTRAMUSCULAR | Status: DC
  Administered 2021-12-29: 200 ug via SUBCUTANEOUS
  Filled 2021-12-29 (×2): qty 0.4

## 2021-12-29 MED ORDER — NA FERRIC GLUC CPLX IN SUCROSE 12.5 MG/ML IV SOLN
250.0000 mg | Freq: Every day | INTRAVENOUS | Status: AC
Start: 2021-12-29 — End: 2022-01-01
  Administered 2021-12-29 – 2022-01-01 (×4): 250 mg via INTRAVENOUS
  Filled 2021-12-29 (×4): qty 20

## 2021-12-29 NOTE — TOC Progression Note (Signed)
  Patient Details  Name: Chelsea Meza Date of Birth: 11/20/1935   Transition of Care [facility]) CM/SW Contact:    Boneta Lucks, RN Phone Number: 12/29/2021, 11:01 AM  TOC updated [name] at [facility], Patient kidney function declining, Palliative consulted, Nephrology and family to decide on dialysis. TOC following will need to restart INS AUTH when medically ready.   Expected Discharge Plan: Skilled Nursing Facility Barriers to Discharge: Continued Medical Work up  Expected Discharge Plan and Services Expected Discharge Plan: [place]

## 2021-12-29 NOTE — Consult Note (Addendum)
Consultation Note Date: 12/29/2021   Patient Name: Chelsea Meza  DOB: January 13, 1936  MRN: [id number]  Age / Sex: 86 y.o., female  PCP: Hal Morales, DO Referring Physician: Kathie Dike, MD  Reason for Consultation:  goals of care  HPI/Patient Profile: 86 y.o. female  with past medical history of Hypertension hyperlipidemia, diabetes, CKD stage IV, CHF, A-fib, admitted on 12/21/2021 with hypoglycemia, hypothermia, and acute renal failure superimposed on stage IV chronic kidney disease.  Nephrology consulted and has been discussing potential dialysis with her and her daughter.  Palliative medicine consulted for goals of care.  Primary Decision Maker PATIENT  Discussion: I have reviewed medical records including Care Everywhere, progress notes from this and prior admissions, labs and imaging.  On evaluation patient is awake alert and oriented.  She is able to participate in goals of care discussion. I met at the bedside with her and her Daughter, Chelsea Meza.  I introduced Palliative Medicine as specialized medical care for people living with serious illness. It focuses on providing relief from the symptoms and stress of a serious illness. The goal is to improve quality of life for both the patient and the family.  We discussed a brief life review of the patient.  She has 3 children.  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  As far as functional and nutritional status she has had ongoing decline since last year.  She is able to ambulate with a walker short distances.  She can get up to the toilet she is continent.  She does require some help with ADLs.  We discussed patient's current illness and what it means in the larger context of patient's on-going co-morbidities.  Natural disease trajectory and expectations at EOL were  discussed.  I attempted to elicit values and goals of care important to the patient.  September's goals of care are to continue interventions that will improve her function.  She wishes to be able to be strong enough to go and live with her daughter, and be able to go out to eat at restaurants. When considering whether or not to do dialysis these are the things that she and her daughter are talking about.  Will the dialysis be able to improve her ability to be strong enough to go home with her daughter after some physical therapy at the nursing facility?  Will the dialysis return her to a state where she can go out to eat with her daughter? They also have concerns that the focus will change to just dialysis and providers will not reinforce other things like physical therapy.  We discussed that she could have dialysis and also do physical therapy. We discussed how dialysis worked, and the difference between peritoneal dialysis and hemodialysis and why these cannot be done in a nursing facility. We also discussed that it is possible that by doing dialysis, it may help her participate more in therapies and be able to do the things  she wishes, however there is also the risk that dialysis will not achieve these things and that she will be very fatigued.  Noted during our conversation living his eyes were tearing.  She notes these are not emotional tears that she has dry eyes and her tear her eyes tear regularly.  Her daughter expressed concerns regarding her level of activity as she feels that the decrease in her activity also contributes to her fatigue and debility.  Anelis would like to at least be allowed to get out of bed to use the bathroom.  She notes that she calls for help but sometimes the help does not come fast enough.  They would like for her to ambulate in the room at least once a day.  We also discussed doing bed exercises, Merlene agreed that putting a reminder note up to do the better  exercises would be helpful.  Advance directives, concepts specific to code status, artificial feeding and hydration, and rehospitalization were considered and discussed.  A healthcare power of attorney document was completed designated patient's daughter Chelsea Meza as her chosen HCPOA.  [name] did not wish to complete the advanced directives part, however she and her daughter agree that she would not want to be prolonged on artificial life support unnecessarily.  We further discussed her CODE STATUS.  If her heart were to stop beating and she was not breathing, then she would not want for efforts at resuscitation would rather be left at peace.  They are in agreement with a DO NOT RESUSCITATE order.  Discussed with patient/family the importance of continued conversation with family and the medical providers regarding overall plan of care and treatment options, ensuring decisions are within the context of the patient's values and GOCs.    We also discussed outpatient palliative services and they are in agreement with outpatient palliative referral.  Questions and concerns were addressed. The family was encouraged to call with questions or concerns.     SUMMARY OF RECOMMENDATIONS -DNR -Chaplain consult for HCPOA completion -See discussion above for goals -Polyvinyl alcohol 1 drop each eye 3 times a day for dry eyes -Increase patient's ambulation with nursing-ideally she would like to attempt ambulation in the room daily and would also like to be able to get up to the toilet  Code Status/Advance Care Planning: DNR   Prognosis:   Unable to determine  Discharge Planning: [facility] for rehab with Palliative care service follow-up  Primary Diagnoses: Present on Admission:  Acute renal failure superimposed on stage 4 chronic kidney disease, unspecified acute renal failure type ([place])  A-fib ([place])  Essential hypertension  Hyperlipidemia   Review of Systems  Constitutional:   Positive for activity change and fatigue.  Eyes:        Frequent tearing   Respiratory:  Negative for shortness of breath.     Physical Exam Vitals and nursing note reviewed.  Constitutional:      General: She is not in acute distress. Eyes:     Comments: Tearing, no redness or itching  Pulmonary:     Effort: Pulmonary effort is normal.  Skin:    General: Skin is warm and dry.  Neurological:     Mental Status: She is alert and oriented to person, place, and time.  Psychiatric:        Mood and Affect: Mood normal.        Behavior: Behavior normal.     Vital Signs: BP (!) 154/76 (BP Location: Left Arm)  Pulse 72   Temp 98.2 F (36.8 C)   Resp 20   Ht '5\' 10"'  (1.778 m)   Wt 74.5 kg   SpO2 100%   BMI 23.56 kg/m  Pain Scale: 0-10   Pain Score: 0-No pain   SpO2: SpO2: 100 % O2 Device:SpO2: 100 % O2 Flow Rate: .   IO: Intake/output summary:  Intake/Output Summary (Last 24 hours) at 12/29/2021 1043 Last data filed at 12/29/2021 0716 Gross per 24 hour  Intake 240 ml  Output 700 ml  Net -460 ml    LBM: Last BM Date : 12/22/21 Baseline Weight: Weight: 74.5 kg Most recent weight: Weight: 74.5 kg       Thank you for this consult. Palliative medicine will continue to follow and assist as needed.  Time Total: 150 minutes Greater than 50%  of this time was spent counseling and coordinating care related to the above assessment and plan.  Signed by: Mariana Kaufman, AGNP-C Palliative Medicine    Please contact Palliative Medicine Team phone at [phone] for questions and concerns.  For individual provider: See Shea Evans

## 2021-12-29 NOTE — Progress Notes (Signed)
PROGRESS NOTE  Chelsea Meza JJO:[id number] DOB: 1936/01/08 DOA: 12/21/2021 PCP: Hal Morales, DO  Brief History:  86 year old female with a history of hypertension, hyperlipidemia, diabetes mellitus type 2, CKD stage IV, CHF, atrial fibrillation, hyperlipidemia presenting from [facility] secondary to altered mental status.  The patient was recently hospitalized from 12/16/2021 to 12/17/2021 secondary to acute on chronic renal failure.  She was discharged to [facility] at that time.  She states that she has been asking for bedtime snack around 8 PM the last 2 evenings, but she has not received them.  The patient's daughter called her earlier in the morning on 12/21/2021.  Apparently the patient was confused.  CBG was ultimately checked and she was noted to have a sugar of 12.  The patient was given glucagon by nursing home staff.  Glucose improved to 36.  Upon EMS arrival, her sugar improved to 73.  Nevertheless, the patient was brought to the emergency department for further evaluation and treatment.  The patient denies any fevers, chills, chest pain, shortness breath, cough, hemoptysis, nausea, vomiting, direct abdominal pain, dysuria, hematuria.  She states that her appetite has been pretty good. In the ED, the patient was afebrile and hemodynamically stable with oxygen saturation 100% room air.  BMP showed sodium 134, potassium 4.3, bicarbonate 22, glucose 64, serum creatinine 4.19.  LFTs were unremarkable.  WBC 4.7, hemoglobin 10.8, platelets 234,000.  EKG showed atrial fibrillation with no ST-T wave changes.  The patient was not actually noted to be hypothermic with a temperature 92.7.  She was placed on a warming blanket. Prolonged hospitalization due to slow renal recovery.  Nephrology consulted and following     Assessment and Plan: * Acute renal failure superimposed on stage 4 chronic kidney disease, unspecified acute renal failure type ([place]) Secondary to volume  depletion and hemodynamic changes Presented with serum creatinine 4.19 Baseline creatinine 3.0-3.3 D/c IVF, pt on hypervolemic side Not much improvement of renal function, likely progression of CKD Renal consult appreciated Discussed with pt and daughter about HD>>noncommital but likely would agree when time arrives or if sense of urgency >>BUN/Creatinine appear to be consistently trending up over last few days   A-fib ([place]) Type unspecified Rate controlled Continue apixaban Continue coreg  Essential hypertension Continue amlodipine, carvedilol, hydralazine  Hyperkalemia Increase lokelma to bid Repeat BMP in am  Hypothermia Presented with temperature 92.7 Check cortisol level--16.6 12/16/2021 TSH 6.574, free T4--1.22 Check PCT--0.50 Blood culture x2 sets--neg to date -improved  Chronic diastolic CHF (congestive heart failure) ([place]) Home lasix dose restarted 12/23/21 10/27/2021 echo EF 60 to 65%, no WMA, trivial MR  Hypoglycemia associated with type 2 diabetes mellitus ([place]) Suspect variable oral intake in the setting of long-acting Lantus and short acting insulin with worsen renal funciton  Received Humalog 6 units 3 times daily at SNF [facility] Lantus NovoLog sliding scale--sensitive scale 10/26/2021 hemoglobin A1c 6.7 No further hypoglycemic episodes  Hyperlipidemia Continue statin  Goals of care -palliative care following -patient now DNR    Family Communication:   daughter updated at bedside 8/28. Tried reaching daughter over phone on 8/31 but was unsuccessful   Consultants:  nephrology, palliative care   Code Status:  DNR    DVT Prophylaxis:  apixaban     Procedures: As Listed in Progress Note Above   Antibiotics: None     Subjective: She denies any shortness of breath, nausea or vomiting   Objective: Vitals:  12/28/21 1629 12/28/21 2030 12/29/21 0622 12/29/21 1437  BP: (!) 141/73 (!) 148/73 (!) 154/76 (!) 144/75  Pulse:  79 72 73  Resp:   '20 20 20  '$ Temp:  97.9 F (36.6 C) 98.2 F (36.8 C) 98 F (36.7 C)  TempSrc:  Oral  Oral  SpO2:  100% 100% 100%  Weight:      Height:        Intake/Output Summary (Last 24 hours) at 12/29/2021 1950 Last data filed at 12/29/2021 1900 Gross per 24 hour  Intake 720 ml  Output 1250 ml  Net -530 ml    Weight change:  Exam:  General:  Pt is alert, follows commands appropriately, not in acute distress HEENT: No icterus, No thrush, No neck mass, [place]/AT Cardiovascular: IRRR, S1/S2, no rubs, no gallops Respiratory: bibasilar crackles. No wheeze Abdomen: Soft/+BS, non tender, non distended, no guarding Extremities: 1+LE  edema, No lymphangitis, No petechiae, No rashes, no synovitis   Data Reviewed: I have personally reviewed following labs and imaging studies Basic Metabolic Panel: Recent Labs  Lab 12/25/21 0617 12/26/21 0858 12/27/21 0424 12/28/21 0346 12/29/21 0451  NA 133* 130* 132* 131* 130*  K 5.1 5.2* 5.5* 5.6* 5.4*  CL 106 103 104 103 100  CO2 21* 18* 21* 21* 20*  GLUCOSE 129* 176* 126* 151* 172*  BUN 104* 107* 110* 108* 116*  CREATININE 4.13* 4.24* 4.28* 4.36* 4.61*  CALCIUM 8.0* 8.2* 8.3* 8.1* 8.0*  PHOS 6.8* 7.1* 7.2* 7.5* 8.0*   Liver Function Tests: Recent Labs  Lab 12/25/21 0617 12/26/21 0858 12/27/21 0424 12/28/21 0346 12/29/21 0451  ALBUMIN 2.3* 2.4* 2.3* 2.1* 2.3*   No results for input(s): "LIPASE", "AMYLASE" in the last 168 hours. No results for input(s): "AMMONIA" in the last 168 hours. Coagulation Profile: No results for input(s): "INR", "PROTIME" in the last 168 hours. CBC: Recent Labs  Lab 12/25/21 0617 12/26/21 0858 12/28/21 0905 12/29/21 0451  WBC 5.8 5.4 5.9 5.8  HGB 7.7* 8.6* 8.2* 7.5*  HCT 23.1* 27.1* 25.9* 22.8*  MCV 90.9 93.8 94.2 89.8  PLT 221 259 226 266   Cardiac Enzymes: No results for input(s): "CKTOTAL", "CKMB", "CKMBINDEX", "TROPONINI" in the last 168 hours. BNP: Invalid input(s): "POCBNP" CBG: Recent Labs  Lab  12/28/21 1604 12/28/21 2034 12/29/21 0800 12/29/21 1130 12/29/21 1652  GLUCAP 202* 214* 150* 160* 174*   HbA1C: No results for input(s): "HGBA1C" in the last 72 hours. Urine analysis:    Component Value Date/Time   COLORURINE YELLOW 12/27/2021 0810   APPEARANCEUR CLEAR 12/27/2021 0810   LABSPEC 1.010 12/27/2021 0810   PHURINE 5.0 12/27/2021 0810   GLUCOSEU NEGATIVE 12/27/2021 0810   HGBUR NEGATIVE 12/27/2021 0810   BILIRUBINUR NEGATIVE 12/27/2021 0810   KETONESUR NEGATIVE 12/27/2021 0810   PROTEINUR 100 (A) 12/27/2021 0810   UROBILINOGEN 0.2 07/13/2012 1236   NITRITE NEGATIVE 12/27/2021 0810   LEUKOCYTESUR NEGATIVE 12/27/2021 0810   Sepsis Labs: '@LABRCNTIP'$ (procalcitonin:4,lacticidven:4) ) Recent Results (from the past 240 hour(s))  Culture, blood (Routine X 2) w Reflex to ID Panel     Status: None   Collection Time: 12/21/21  3:06 PM   Specimen: Right Antecubital; Blood  Result Value Ref Range Status   Specimen Description RIGHT ANTECUBITAL  Final   Special Requests   Final    BOTTLES DRAWN AEROBIC AND ANAEROBIC Blood Culture adequate volume   Culture   Final    NO GROWTH 5 DAYS Performed at [facility], [address] [place], [place]  [address]    Report Status 12/26/2021 FINAL  Final  Culture, blood (Routine X 2) w Reflex to ID Panel     Status: None   Collection Time: 12/21/21  3:06 PM   Specimen: BLOOD RIGHT HAND  Result Value Ref Range Status   Specimen Description BLOOD RIGHT HAND  Final   Special Requests   Final    BOTTLES DRAWN AEROBIC ONLY Blood Culture adequate volume   Culture   Final    NO GROWTH 5 DAYS Performed at [facility], [address] [place], [place] [address]    Report Status 12/26/2021 FINAL  Final     Scheduled Meds:  amLODipine  10 mg Oral Daily   apixaban  2.5 mg Oral BID   atorvastatin  40 mg Oral QHS   carvedilol  3.125 mg Oral BID WC   Darbepoetin Alfa       darbepoetin (ARANESP) injection - NON-DIALYSIS  200 mcg  Subcutaneous Q Thu-1800   feeding supplement  237 mL Oral BID BM   hydrALAZINE  100 mg Oral BID   insulin aspart  0-5 Units Subcutaneous QHS   insulin aspart  0-9 Units Subcutaneous TID WC   polyethylene glycol  17 g Oral Daily   polyvinyl alcohol  1 drop Both Eyes TID   senna  2 tablet Oral Daily   sodium zirconium cyclosilicate  10 g Oral BID   Continuous Infusions:  ferric gluconate (FERRLECIT) IVPB 250 mg (12/29/21 1234)    Procedures/Studies: US RENAL  Result Date: 12/27/2021 CLINICAL DATA:  Acute kidney insufficiency on admission. EXAM: RENAL / URINARY TRACT ULTRASOUND COMPLETE COMPARISON:  Renal ultrasound 04/05/2020, CT abdomen and pelvis 02/21/2021 FINDINGS: Right Kidney: Renal measurements: 9.7 x 4.7 x 5.4 cm = volume: 129 mL. Increased cortical echogenicity as can be seen with medical renal disease, similar to prior. Lateral right midpole hypoechoic cyst measuring up to 1.1 cm, benign. No suspicious right renal mass is seen. Left Kidney: Limited evaluation of the left kidney due to overlying bowel gas. Renal measurements: Maximally 9.0 x 4.5 x 3.2 cm = volume: Approximately 67 mL. There again appears to be increased echogenicity of the left renal cortex as can be seen with medical renal disease. No gross cyst or solid mass is seen. Bladder: Only mildly distended, limiting evaluation. Other: None. IMPRESSION: 1. Evaluation of the left kidney is limited by overlying bowel gas. No hydronephrosis is seen either side. 2. There is again increased cortical echogenicity as can be seen with medical renal disease. Electronically Signed   By: Yvonne Kendall M.D.   On: 12/27/2021 10:47   DG Chest Portable 1 View  Result Date: 12/16/2021 CLINICAL DATA:  Difficult the rising to standing position. EXAM: PORTABLE CHEST 1 VIEW COMPARISON:  11/10/2021 FINDINGS: Lungs are hypoinflated and otherwise clear. Mild stable cardiomegaly. Remainder of the exam is unchanged. IMPRESSION: Hypoinflation without  acute cardiopulmonary disease. Electronically Signed   By: Marin Olp M.D.   On: 12/16/2021 13:15    Kathie Dike, MD  [facility]  If 7PM-7AM, please contact night-coverage [email or web address] 12/29/2021, 7:50 PM   LOS: 8 days

## 2021-12-29 NOTE — Progress Notes (Signed)
Patient ID: Chelsea Meza, female   DOB: 12-16-1935, 86 y.o.   MRN: [id number]  S: Feels well this morning.  No events overnight. UOP at least 600-  denies appetite disturbance-  daughter did come in as well-  palliative care meeting scheduled for later this AM   O:BP (!) 154/76 (BP Location: Left Arm)   Pulse 72   Temp 98.2 F (36.8 C)   Resp 20   Ht '5\' 10"'$  (1.778 m)   Wt 74.5 kg   SpO2 100%   BMI 23.56 kg/m   Intake/Output Summary (Last 24 hours) at 12/29/2021 0815 Last data filed at 12/29/2021 0716 Gross per 24 hour  Intake 240 ml  Output 700 ml  Net -460 ml   Intake/Output: I/O last 3 completed shifts: In: -  Out: 1050 [Urine:1050]  Intake/Output this shift:  Total I/O In: 240 [P.O.:240] Out: 100 [Urine:100] Weight change:  Gen: frail, chronically ill-appearing, NAD CVS:RRR Resp: CTA Abd: +BS, soft, NT/ND Ext: pitting edema  Recent Labs  Lab 12/23/21 0323 12/24/21 0629 12/25/21 0617 12/26/21 0858 12/27/21 0424 12/28/21 0346 12/29/21 0451  NA 133* 135 133* 130* 132* 131* 130*  K 5.1 5.6* 5.1 5.2* 5.5* 5.6* 5.4*  CL 107 106 106 103 104 103 100  CO2 20* 23 21* 18* 21* 21* 20*  GLUCOSE 161* 154* 129* 176* 126* 151* 172*  BUN 102* 97* 104* 107* 110* 108* 116*  CREATININE 4.10* 4.20* 4.13* 4.24* 4.28* 4.36* 4.61*  ALBUMIN 2.3* 2.3* 2.3* 2.4* 2.3* 2.1* 2.3*  CALCIUM 7.7* 8.1* 8.0* 8.2* 8.3* 8.1* 8.0*  PHOS 5.9* 6.4* 6.8* 7.1* 7.2* 7.5* 8.0*   Liver Function Tests: Recent Labs  Lab 12/27/21 0424 12/28/21 0346 12/29/21 0451  ALBUMIN 2.3* 2.1* 2.3*   No results for input(s): "LIPASE", "AMYLASE" in the last 168 hours. No results for input(s): "AMMONIA" in the last 168 hours. CBC: Recent Labs  Lab 12/25/21 0617 12/26/21 0858 12/28/21 0905 12/29/21 0451  WBC 5.8 5.4 5.9 5.8  HGB 7.7* 8.6* 8.2* 7.5*  HCT 23.1* 27.1* 25.9* 22.8*  MCV 90.9 93.8 94.2 89.8  PLT 221 259 226 266   Cardiac Enzymes: No results for input(s): "CKTOTAL", "CKMB",  "CKMBINDEX", "TROPONINI" in the last 168 hours. CBG: Recent Labs  Lab 12/28/21 0741 12/28/21 1103 12/28/21 1604 12/28/21 2034 12/29/21 0800  GLUCAP 140* 213* 202* 214* 150*    Iron Studies:  Recent Labs    12/26/21 0857  IRON 63  TIBC 227*   Studies/Results: US RENAL  Result Date: 12/27/2021 CLINICAL DATA:  Acute kidney insufficiency on admission. EXAM: RENAL / URINARY TRACT ULTRASOUND COMPLETE COMPARISON:  Renal ultrasound 04/05/2020, CT abdomen and pelvis 02/21/2021 FINDINGS: Right Kidney: Renal measurements: 9.7 x 4.7 x 5.4 cm = volume: 129 mL. Increased cortical echogenicity as can be seen with medical renal disease, similar to prior. Lateral right midpole hypoechoic cyst measuring up to 1.1 cm, benign. No suspicious right renal mass is seen. Left Kidney: Limited evaluation of the left kidney due to overlying bowel gas. Renal measurements: Maximally 9.0 x 4.5 x 3.2 cm = volume: Approximately 67 mL. There again appears to be increased echogenicity of the left renal cortex as can be seen with medical renal disease. No gross cyst or solid mass is seen. Bladder: Only mildly distended, limiting evaluation. Other: None. IMPRESSION: 1. Evaluation of the left kidney is limited by overlying bowel gas. No hydronephrosis is seen either side. 2. There is again increased cortical echogenicity as can be seen  with medical renal disease. Electronically Signed   By: Yvonne Kendall M.D.   On: 12/27/2021 10:47     amLODipine  10 mg Oral Daily   apixaban  2.5 mg Oral BID   atorvastatin  40 mg Oral QHS   carvedilol  3.125 mg Oral BID WC   feeding supplement  237 mL Oral BID BM   hydrALAZINE  100 mg Oral BID   insulin aspart  0-5 Units Subcutaneous QHS   insulin aspart  0-9 Units Subcutaneous TID WC   polyethylene glycol  17 g Oral Daily   senna  2 tablet Oral Daily   sodium zirconium cyclosilicate  10 g Oral BID    BMET    Component Value Date/Time   NA 130 (L) 12/29/2021 0451   K 5.4 (H)  12/29/2021 0451   CL 100 12/29/2021 0451   CO2 20 (L) 12/29/2021 0451   GLUCOSE 172 (H) 12/29/2021 0451   BUN 116 (H) 12/29/2021 0451   CREATININE 4.61 (H) 12/29/2021 0451   CALCIUM 8.0 (L) 12/29/2021 0451   GFRNONAA 9 (L) 12/29/2021 0451   GFRAA 28 (L) 10/22/2018 0526   CBC    Component Value Date/Time   WBC 5.8 12/29/2021 0451   RBC 2.54 (L) 12/29/2021 0451   HGB 7.5 (L) 12/29/2021 0451   HCT 22.8 (L) 12/29/2021 0451   PLT 266 12/29/2021 0451   MCV 89.8 12/29/2021 0451   MCH 29.5 12/29/2021 0451   MCHC 32.9 12/29/2021 0451   RDW 13.6 12/29/2021 0451   LYMPHSABS 1.2 12/21/2021 1207   MONOABS 0.2 12/21/2021 1207   EOSABS 0.0 12/21/2021 1207   BASOSABS 0.0 12/21/2021 1207    Assessment/Plan:   AKI/CKD stage IV - Longstanding CKD presumably due to DM and HTN.  Was followed by Dr. Theador Hawthorne but not seen since September 2021.  Scr was 2.63 and 24 hour creatinine clearance was 18 mL/min on 12/12/19.  Scr was 2.4-2.65 in 2022 but has been ranging 3.1-3.5 prior to admission which was 4.2.  Marked diuresis of 3.2 liters since admit but led to inc in crt  Lasix on hold since 12/27/21.   no urgent indication for dialysis at this time but when I hear has had decline over the last 6 weeks-  and not being able to fully participate in rehab I think might be due to uremia and volume overload.  She would be a marginal candidate at best given her poor nutritional and functional status but again feel that CKD is responsible for both.  Discussed different modalities of RRT with Chelsea Meza and her daughter.  Her daughter would likely want to proceed-  wants PD but I explained can only do HD while at [facility]-  seem to be leaning toward giving it a try Hyponatremia - felt to be hypervolemic  Hyperkalemia - Due to #1.  Continue with lokelma but increase dose to bid.  Lasix on hold for now as above. MGUS - SPEP/UPEP pending (has not been checked in over 2 years). Anemia of CKD stage IV - Hgb down to 8.6.  iron  stores low-  will give iron and ESA-  and transfuse for Hgb <7.  Likely contributing to her FTT as well  Atrial fibrillation - rate controlled, per primary Chronic diastolic CHF - ECHO on 9/81/19 concerning for cardiac amyloid.  Has never seen a Cardiologist. Still overloaded DM type 2 - presented with hypoglycemia.  Plan per primary svc Disposition - came from SNF and not  strong enough to go home.  She is a marginal dialysis candidate as above.  But was doing well 2 mos ago-  I feel like CKD , volume overload and anemia are likely contributing to her FTT and I feel like she wont significantly improve unless we start HD.  Louis Meckel  [employer]

## 2021-12-30 DIAGNOSIS — I10 Essential (primary) hypertension: Secondary | ICD-10-CM | POA: Diagnosis not present

## 2021-12-30 DIAGNOSIS — N179 Acute kidney failure, unspecified: Secondary | ICD-10-CM | POA: Diagnosis not present

## 2021-12-30 DIAGNOSIS — I4891 Unspecified atrial fibrillation: Secondary | ICD-10-CM | POA: Diagnosis not present

## 2021-12-30 DIAGNOSIS — E875 Hyperkalemia: Secondary | ICD-10-CM | POA: Diagnosis not present

## 2021-12-30 LAB — GLUCOSE, CAPILLARY
Glucose-Capillary: 223 mg/dL — ABNORMAL HIGH (ref 70–99)
Glucose-Capillary: 133 mg/dL — ABNORMAL HIGH (ref 70–99)
Glucose-Capillary: 179 mg/dL — ABNORMAL HIGH (ref 70–99)
Glucose-Capillary: 203 mg/dL — ABNORMAL HIGH (ref 70–99)
Glucose-Capillary: 220 mg/dL — ABNORMAL HIGH (ref 70–99)

## 2021-12-30 LAB — RENAL FUNCTION PANEL
Albumin: 2.3 g/dL — ABNORMAL LOW (ref 3.5–5.0)
Anion gap: 10 (ref 5–15)
BUN: 118 mg/dL — ABNORMAL HIGH (ref 8–23)
CO2: 19 mmol/L — ABNORMAL LOW (ref 22–32)
Calcium: 8.3 mg/dL — ABNORMAL LOW (ref 8.9–10.3)
Chloride: 101 mmol/L (ref 98–111)
Creatinine, Ser: 4.46 mg/dL — ABNORMAL HIGH (ref 0.44–1.00)
GFR, Estimated: 9 mL/min — ABNORMAL LOW (ref >60)
Glucose, Bld: 160 mg/dL — ABNORMAL HIGH (ref 70–99)
Phosphorus: 7.6 mg/dL — ABNORMAL HIGH (ref 2.5–4.6)
Potassium: 5.3 mmol/L — ABNORMAL HIGH (ref 3.5–5.1)
Sodium: 130 mmol/L — ABNORMAL LOW (ref 135–145)

## 2021-12-30 LAB — CBC
HCT: 26.1 % — ABNORMAL LOW (ref 36.0–46.0)
Hemoglobin: 8.5 g/dL — ABNORMAL LOW (ref 12.0–15.0)
MCH: 29.5 pg (ref 26.0–34.0)
MCHC: 32.6 g/dL (ref 30.0–36.0)
MCV: 90.6 fL (ref 80.0–100.0)
Platelets: 340 10*3/uL (ref 150–400)
RBC: 2.88 MIL/uL — ABNORMAL LOW (ref 3.87–5.11)
RDW: 13.6 % (ref 11.5–15.5)
WBC: 5.9 10*3/uL (ref 4.0–10.5)
nRBC: 0 % (ref 0.0–0.2)

## 2021-12-30 NOTE — Progress Notes (Addendum)
Patient ID: Chelsea Meza, female   DOB: [birth year], 86 y.o.   MRN: [id number]  S: Feels well this morning.  No events overnight. UOP at least 1300-  denies appetite disturbance-   palliative care meeting helpful-  is now DNR but sounds like wanting to give dialysis a try -  kidney numbers pretty stable    O:BP (!) 163/84   Pulse 72   Temp 97.8 F (36.6 C)   Resp 20   Ht '5\' 10"'$  (1.778 m)   Wt 74.5 kg   SpO2 100%   BMI 23.56 kg/m   Intake/Output Summary (Last 24 hours) at 12/30/2021 0804 Last data filed at 12/30/2021 0459 Gross per 24 hour  Intake 755.48 ml  Output 1200 ml  Net -444.52 ml   Intake/Output: I/O last 3 completed shifts: In: 995.5 [P.O.:720; IV Piggyback:275.5] Out: 1900 [Urine:1900]  Intake/Output this shift:  No intake/output data recorded. Weight change:  Gen: frail, chronically ill-appearing, NAD CVS:RRR Resp: CTA Abd: +BS, soft, NT/ND Ext: pitting edema  Recent Labs  Lab 12/24/21 0629 12/25/21 0617 12/26/21 0858 12/27/21 0424 12/28/21 0346 12/29/21 0451 12/30/21 0402  NA 135 133* 130* 132* 131* 130* 130*  K 5.6* 5.1 5.2* 5.5* 5.6* 5.4* 5.3*  CL 106 106 103 104 103 100 101  CO2 23 21* 18* 21* 21* 20* 19*  GLUCOSE 154* 129* 176* 126* 151* 172* 160*  BUN 97* 104* 107* 110* 108* 116* 118*  CREATININE 4.20* 4.13* 4.24* 4.28* 4.36* 4.61* 4.46*  ALBUMIN 2.3* 2.3* 2.4* 2.3* 2.1* 2.3* 2.3*  CALCIUM 8.1* 8.0* 8.2* 8.3* 8.1* 8.0* 8.3*  PHOS 6.4* 6.8* 7.1* 7.2* 7.5* 8.0* 7.6*   Liver Function Tests: Recent Labs  Lab 12/28/21 0346 12/29/21 0451 12/30/21 0402  ALBUMIN 2.1* 2.3* 2.3*   No results for input(s): "LIPASE", "AMYLASE" in the last 168 hours. No results for input(s): "AMMONIA" in the last 168 hours. CBC: Recent Labs  Lab 12/25/21 0617 12/26/21 0858 12/28/21 0905 12/29/21 0451  WBC 5.8 5.4 5.9 5.8  HGB 7.7* 8.6* 8.2* 7.5*  HCT 23.1* 27.1* 25.9* 22.8*  MCV 90.9 93.8 94.2 89.8  PLT 221 259 226 266   Cardiac Enzymes: No results for  input(s): "CKTOTAL", "CKMB", "CKMBINDEX", "TROPONINI" in the last 168 hours. CBG: Recent Labs  Lab 12/29/21 0800 12/29/21 1130 12/29/21 1652 12/29/21 2204 12/30/21 0727  GLUCAP 150* 160* 174* 223* 133*    Iron Studies:  No results for input(s): "IRON", "TIBC", "TRANSFERRIN", "FERRITIN" in the last 72 hours.  Studies/Results: No results found.   amLODipine  10 mg Oral Daily   apixaban  2.5 mg Oral BID   atorvastatin  40 mg Oral QHS   carvedilol  3.125 mg Oral BID WC   darbepoetin (ARANESP) injection - NON-DIALYSIS  200 mcg Subcutaneous Q Thu-1800   feeding supplement  237 mL Oral BID BM   hydrALAZINE  100 mg Oral BID   insulin aspart  0-5 Units Subcutaneous QHS   insulin aspart  0-9 Units Subcutaneous TID WC   polyethylene glycol  17 g Oral Daily   polyvinyl alcohol  1 drop Both Eyes TID   senna  2 tablet Oral Daily   sodium zirconium cyclosilicate  10 g Oral BID    BMET    Component Value Date/Time   NA 130 (L) 12/30/2021 0402   K 5.3 (H) 12/30/2021 0402   CL 101 12/30/2021 0402   CO2 19 (L) 12/30/2021 0402   GLUCOSE 160 (H) 12/30/2021 0402  BUN 118 (H) 12/30/2021 0402   CREATININE 4.46 (H) 12/30/2021 0402   CALCIUM 8.3 (L) 12/30/2021 0402   GFRNONAA 9 (L) 12/30/2021 0402   GFRAA 28 (L) 10/22/2018 0526   CBC    Component Value Date/Time   WBC 5.8 12/29/2021 0451   RBC 2.54 (L) 12/29/2021 0451   HGB 7.5 (L) 12/29/2021 0451   HCT 22.8 (L) 12/29/2021 0451   PLT 266 12/29/2021 0451   MCV 89.8 12/29/2021 0451   MCH 29.5 12/29/2021 0451   MCHC 32.9 12/29/2021 0451   RDW 13.6 12/29/2021 0451   LYMPHSABS 1.2 12/21/2021 1207   MONOABS 0.2 12/21/2021 1207   EOSABS 0.0 12/21/2021 1207   BASOSABS 0.0 12/21/2021 1207    Assessment/Plan:   AKI/CKD stage IV - Longstanding CKD presumably due to DM and HTN.  Was followed by Dr. Theador Hawthorne but not seen since September 2021.  Scr was 2.63 and 24 hour creatinine clearance 18 mL/min on 12/12/19.  Scr was 2.4-2.65 in 2022  but has been ranging 3.1-3.5 prior to admission when was 4.2.  Marked diuresis of 3.2 liters since admit but led to inc in crt  Lasix on hold since 12/27/21.   no urgent indication for dialysis at this time but when I hear has had decline over the last 6 weeks-  and not being able to fully participate in rehab I think might be due to uremia and volume overload.  She would be a marginal candidate given her poor nutritional and functional status but again feel that CKD could be responsible for both.  Discussed different modalities of RRT with Mrs. Mccrumb and her daughter.  Her daughter would likely want to proceed-  wants PD but I explained can only do HD while at [facility]-  seem to be leaning toward giving it a try.  Patient is non committal this AM-  pt on eliquis so likely would not be able to get [facility] today -  will plan on holding eliquis for possible [facility] placement Mon or Tuesday if plan is still to start Hyponatremia - felt to be hypervolemic  Hyperkalemia - Due to #1.  Continue with lokelma  bid.  Lasix on hold for now as above. MGUS - SPEP/UPEP pending (has not been checked in over 2 years). Anemia of CKD stage IV - Hgb down to 8.6.  iron stores low-  will give iron and ESA-  and transfuse for Hgb <7.  Likely contributing to her FTT as well  Atrial fibrillation - rate controlled, per primary Chronic diastolic CHF - ECHO on 07/24/69 concerning for cardiac amyloid.  Has never seen a Cardiologist. Still overloaded DM type 2 - presented with hypoglycemia.  Plan per primary svc Disposition - came from SNF and not strong enough to go home.  She is a marginal dialysis candidate as above.  But was doing well 2 mos ago-  I feel like CKD , volume overload and anemia are likely contributing to her FTT and I feel like she wont significantly improve UNLESS we start HD.but one never knows how it is going to go   Patient will not be physically seen over the weekend-  will revisit on Monday , then if plan is to give HD a try  will arrange for [facility] placement likely on Tuesday   [name]

## 2021-12-30 NOTE — Progress Notes (Signed)
PROGRESS NOTE  CLOVA MORLOCK HOZ:[id number] DOB: 08/29/1935 DOA: 12/21/2021 PCP: Hal Morales, DO  Brief History:  86 year old female with a history of hypertension, hyperlipidemia, diabetes mellitus type 2, CKD stage IV, CHF, atrial fibrillation, hyperlipidemia presenting from [facility] secondary to altered mental status.  The patient was recently hospitalized from 12/16/2021 to 12/17/2021 secondary to acute on chronic renal failure.  She was discharged to [facility] at that time.  She states that she has been asking for bedtime snack around 8 PM the last 2 evenings, but she has not received them.  The patient's daughter called her earlier in the morning on 12/21/2021.  Apparently the patient was confused.  CBG was ultimately checked and she was noted to have a sugar of 12.  The patient was given glucagon by nursing home staff.  Glucose improved to 36.  Upon EMS arrival, her sugar improved to 73.  Nevertheless, the patient was brought to the emergency department for further evaluation and treatment.  The patient denies any fevers, chills, chest pain, shortness breath, cough, hemoptysis, nausea, vomiting, direct abdominal pain, dysuria, hematuria.  She states that her appetite has been pretty good. In the ED, the patient was afebrile and hemodynamically stable with oxygen saturation 100% room air.  BMP showed sodium 134, potassium 4.3, bicarbonate 22, glucose 64, serum creatinine 4.19.  LFTs were unremarkable.  WBC 4.7, hemoglobin 10.8, platelets 234,000.  EKG showed atrial fibrillation with no ST-T wave changes.  The patient was not actually noted to be hypothermic with a temperature 92.7.  She was placed on a warming blanket. Prolonged hospitalization due to slow renal recovery.  Nephrology consulted and following     Assessment and Plan: * Acute renal failure superimposed on stage 4 chronic kidney disease, unspecified acute renal failure type ([place]) Secondary to volume  depletion and hemodynamic changes Presented with serum creatinine 4.19 Baseline creatinine 3.0-3.3 D/c IVF, pt on hypervolemic side Not much improvement of renal function, likely progression of CKD Renal consult appreciated Discussed with pt and daughter about HD>>noncommital but likely would agree when time arrives or if sense of urgency >>BUN/Creatinine appear to be consistently trending up over last few days -Planning to follow renal function over the weekend -Hold any anticoagulation for now in case dialysis catheter needed on Monday   A-fib ([place]) Type unspecified Rate controlled Holding apixaban in case any procedures are needed Continue coreg  Essential hypertension Continue amlodipine, carvedilol, hydralazine  Hyperkalemia Continue twice daily Lokelma Repeat BMP in am  Hypothermia Presented with temperature 92.7 Check cortisol level--16.6 12/16/2021 TSH 6.574, free T4--1.22 Check PCT--0.50 Blood culture x2 sets--neg to date -improved  Chronic diastolic CHF (congestive heart failure) ([place]) Home lasix dose restarted 12/23/21 10/27/2021 echo EF 60 to 65%, no WMA, trivial MR  Hypoglycemia associated with type 2 diabetes mellitus ([place]) Suspect variable oral intake in the setting of long-acting Lantus and short acting insulin with worsen renal funciton  Received Humalog 6 units 3 times daily at SNF Holding Lantus NovoLog sliding scale--sensitive scale 10/26/2021 hemoglobin A1c 6.7 No further hypoglycemic episodes  Hyperlipidemia Continue statin  Goals of care -palliative care following -patient now DNR    Family Communication:   daughter updated at bedside 8/28. Tried reaching daughter over phone on 8/31 but was unsuccessful   Consultants:  nephrology, palliative care   Code Status:  DNR    DVT Prophylaxis: SCDs     Procedures: As Listed in Progress Note  Above   Antibiotics: None     Subjective: She feels well, denies any complaints  today.   Objective: Vitals:   12/29/21 1437 12/29/21 2207 12/30/21 0459 12/30/21 1330  BP: (!) 144/75 (!) 178/86 (!) 163/84 (!) 140/73  Pulse: 73 80 72 75  Resp: 20 20    Temp: 98 F (36.7 C) 98 F (36.7 C) 97.8 F (36.6 C) (!) 97.5 F (36.4 C)  TempSrc: Oral Oral  Oral  SpO2: 100% 100% 100% 100%  Weight:      Height:        Intake/Output Summary (Last 24 hours) at 12/30/2021 1916 Last data filed at 12/30/2021 1804 Gross per 24 hour  Intake 1266.89 ml  Output 1000 ml  Net 266.89 ml    Weight change:  Exam:  General:  Pt is alert, follows commands appropriately, not in acute distress HEENT: No icterus, No thrush, No neck mass, [place]/AT Cardiovascular: IRRR, S1/S2, no rubs, no gallops Respiratory: bibasilar crackles. No wheeze Abdomen: Soft/+BS, non tender, non distended, no guarding Extremities: 1+LE  edema, No lymphangitis, No petechiae, No rashes, no synovitis   Data Reviewed: I have personally reviewed following labs and imaging studies Basic Metabolic Panel: Recent Labs  Lab 12/26/21 0858 12/27/21 0424 12/28/21 0346 12/29/21 0451 12/30/21 0402  NA 130* 132* 131* 130* 130*  K 5.2* 5.5* 5.6* 5.4* 5.3*  CL 103 104 103 100 101  CO2 18* 21* 21* 20* 19*  GLUCOSE 176* 126* 151* 172* 160*  BUN 107* 110* 108* 116* 118*  CREATININE 4.24* 4.28* 4.36* 4.61* 4.46*  CALCIUM 8.2* 8.3* 8.1* 8.0* 8.3*  PHOS 7.1* 7.2* 7.5* 8.0* 7.6*   Liver Function Tests: Recent Labs  Lab 12/26/21 0858 12/27/21 0424 12/28/21 0346 12/29/21 0451 12/30/21 0402  ALBUMIN 2.4* 2.3* 2.1* 2.3* 2.3*   No results for input(s): "LIPASE", "AMYLASE" in the last 168 hours. No results for input(s): "AMMONIA" in the last 168 hours. Coagulation Profile: No results for input(s): "INR", "PROTIME" in the last 168 hours. CBC: Recent Labs  Lab 12/25/21 0617 12/26/21 0858 12/28/21 0905 12/29/21 0451 12/30/21 0922  WBC 5.8 5.4 5.9 5.8 5.9  HGB 7.7* 8.6* 8.2* 7.5* 8.5*  HCT 23.1* 27.1* 25.9*  22.8* 26.1*  MCV 90.9 93.8 94.2 89.8 90.6  PLT 221 259 226 266 340   Cardiac Enzymes: No results for input(s): "CKTOTAL", "CKMB", "CKMBINDEX", "TROPONINI" in the last 168 hours. BNP: Invalid input(s): "POCBNP" CBG: Recent Labs  Lab 12/29/21 1652 12/29/21 2204 12/30/21 0727 12/30/21 1121 12/30/21 1608  GLUCAP 174* 223* 133* 179* 203*   HbA1C: No results for input(s): "HGBA1C" in the last 72 hours. Urine analysis:    Component Value Date/Time   COLORURINE YELLOW 12/27/2021 0810   APPEARANCEUR CLEAR 12/27/2021 0810   LABSPEC 1.010 12/27/2021 0810   PHURINE 5.0 12/27/2021 0810   GLUCOSEU NEGATIVE 12/27/2021 0810   HGBUR NEGATIVE 12/27/2021 0810   BILIRUBINUR NEGATIVE 12/27/2021 0810   KETONESUR NEGATIVE 12/27/2021 0810   PROTEINUR 100 (A) 12/27/2021 0810   UROBILINOGEN 0.2 07/13/2012 1236   NITRITE NEGATIVE 12/27/2021 0810   LEUKOCYTESUR NEGATIVE 12/27/2021 0810   Sepsis Labs: '@LABRCNTIP'$ (procalcitonin:4,lacticidven:4) ) Recent Results (from the past 240 hour(s))  Culture, blood (Routine X 2) w Reflex to ID Panel     Status: None   Collection Time: 12/21/21  3:06 PM   Specimen: Right Antecubital; Blood  Result Value Ref Range Status   Specimen Description RIGHT ANTECUBITAL  Final   Special Requests   Final  BOTTLES DRAWN AEROBIC AND ANAEROBIC Blood Culture adequate volume   Culture   Final    NO GROWTH 5 DAYS Performed at [facility], [address] [place], [place] [address]    Report Status 12/26/2021 FINAL  Final  Culture, blood (Routine X 2) w Reflex to ID Panel     Status: None   Collection Time: 12/21/21  3:06 PM   Specimen: BLOOD RIGHT HAND  Result Value Ref Range Status   Specimen Description BLOOD RIGHT HAND  Final   Special Requests   Final    BOTTLES DRAWN AEROBIC ONLY Blood Culture adequate volume   Culture   Final    NO GROWTH 5 DAYS Performed at [facility], [address], [place], [place] [address]    Report Status 12/26/2021 FINAL  Final      Scheduled Meds:  amLODipine  10 mg Oral Daily   atorvastatin  40 mg Oral QHS   carvedilol  3.125 mg Oral BID WC   darbepoetin (ARANESP) injection - NON-DIALYSIS  200 mcg Subcutaneous Q Thu-1800   feeding supplement  237 mL Oral BID BM   hydrALAZINE  100 mg Oral BID   insulin aspart  0-5 Units Subcutaneous QHS   insulin aspart  0-9 Units Subcutaneous TID WC   polyethylene glycol  17 g Oral Daily   polyvinyl alcohol  1 drop Both Eyes TID   senna  2 tablet Oral Daily   sodium zirconium cyclosilicate  10 g Oral BID   Continuous Infusions:  ferric gluconate (FERRLECIT) IVPB Stopped (12/30/21 1228)    Procedures/Studies: US RENAL  Result Date: 12/27/2021 CLINICAL DATA:  Acute kidney insufficiency on admission. EXAM: RENAL / URINARY TRACT ULTRASOUND COMPLETE COMPARISON:  Renal ultrasound 04/05/2020, CT abdomen and pelvis 02/21/2021 FINDINGS: Right Kidney: Renal measurements: 9.7 x 4.7 x 5.4 cm = volume: 129 mL. Increased cortical echogenicity as can be seen with medical renal disease, similar to prior. Lateral right midpole hypoechoic cyst measuring up to 1.1 cm, benign. No suspicious right renal mass is seen. Left Kidney: Limited evaluation of the left kidney due to overlying bowel gas. Renal measurements: Maximally 9.0 x 4.5 x 3.2 cm = volume: Approximately 67 mL. There again appears to be increased echogenicity of the left renal cortex as can be seen with medical renal disease. No gross cyst or solid mass is seen. Bladder: Only mildly distended, limiting evaluation. Other: None. IMPRESSION: 1. Evaluation of the left kidney is limited by overlying bowel gas. No hydronephrosis is seen either side. 2. There is again increased cortical echogenicity as can be seen with medical renal disease. Electronically Signed   By: Yvonne Kendall M.D.   On: 12/27/2021 10:47   DG Chest Portable 1 View  Result Date: 12/16/2021 CLINICAL DATA:  Difficult the rising to standing position. EXAM: PORTABLE CHEST 1  VIEW COMPARISON:  11/10/2021 FINDINGS: Lungs are hypoinflated and otherwise clear. Mild stable cardiomegaly. Remainder of the exam is unchanged. IMPRESSION: Hypoinflation without acute cardiopulmonary disease. Electronically Signed   By: Marin Olp M.D.   On: 12/16/2021 13:15    Kathie Dike, MD  [facility]  If 7PM-7AM, please contact night-coverage [email or web address] 12/30/2021, 7:16 PM   LOS: 9 days

## 2021-12-30 NOTE — Care Management Important Message (Signed)
Important Message  Patient Details  Name: Chelsea Meza MRN: [id number] Date of Birth: 09-Nov-1935   Medicare Important Message Given:  Yes     Tommy Medal 12/30/2021, 12:38 PM

## 2021-12-30 NOTE — TOC Progression Note (Signed)
Transition of Care [facility]) - Progression Note    Patient Details  Name: TANYSHA QUANT MRN: [id number] Date of Birth: 08-28-1935  Transition [facility] CM/SW Contact  Salome Arnt, [place] Phone Number: 12/30/2021, 10:20 AM  Clinical Narrative:  TOC received consult for outpatient palliative at SNF. Discussed with pt who is agreeable with no preference on agency. Referred and accepted by Sarah with [facility]. Pt to make final decision on dialysis at beginning of next week. SNF updated. TOC will continue to follow.       Expected Discharge Plan: Skilled Nursing Facility Barriers to Discharge: Continued Medical Work up  Expected Discharge Plan and Services Expected Discharge Plan: [place]                                               Social Determinants of Health (SDOH) Interventions    Readmission Risk Interventions    12/22/2021    2:27 PM 02/24/2021   12:38 PM  Readmission Risk Prevention Plan  Transportation Screening Complete Complete  PCP or Specialist Appt within 3-5 Days Complete   HRI or Home Care Consult Complete Patient refused  Social Work Consult for [place] Planning/Counseling Complete Complete  Palliative Care Screening Not Applicable Not Applicable  Medication Review [employer] photographer) Complete Complete

## 2021-12-30 NOTE — Evaluation (Signed)
Occupational Therapy Evaluation Patient Details Name: Chelsea Meza MRN: [id number] DOB: [birth year] Today's Date: 12/30/2021   History of Present Illness Chelsea Meza is a 86 year old female with a history of hypertension, hyperlipidemia, diabetes mellitus type 2, CKD stage IV, CHF, atrial fibrillation, hyperlipidemia presenting from [facility] secondary to altered mental status.  The patient was recently hospitalized from 12/16/2021 to 12/17/2021 secondary to acute on chronic renal failure.  She was discharged to [facility] at that time.  She states that she has been asking for bedtime snack around 8 PM the last 2 evenings, but she has not received them.  The patient's daughter called her earlier in the morning on 12/21/2021.  Apparently the patient was confused.  CBG was ultimately checked and she was noted to have a sugar of 12.  The patient was given glucagon by nursing home staff.  Glucose improved to 36.  Upon EMS arrival, her sugar improved to 73.  Nevertheless, the patient was brought to the emergency department for further evaluation and treatment.  The patient denies any fevers, chills, chest pain, shortness breath, cough, hemoptysis, nausea, vomiting, direct abdominal pain, dysuria, hematuria.  She states that her appetite has been pretty good.  In the ED, the patient was afebrile and hemodynamically stable with oxygen saturation 100% room air.  BMP showed sodium 134, potassium 4.3, bicarbonate 22, glucose 64, serum creatinine 4.19.  LFTs were unremarkable.  WBC 4.7, hemoglobin 10.8, platelets 234,000.  EKG showed atrial fibrillation with no ST-T wave changes.  The patient was not actually noted to be hypothermic with a temperature 92.7.  She was placed on a warming blanket.   Clinical Impression   Pt agreeable to OT evaluation with PT treatment. Pt presnts with ability to sit at EOB without physical assist. Mod to max A needed to boost from EOB with assist on either side from  therapists. Pt demonstrates weakness in B UE ~3+/5 grossly. Pt was able to ambulate in room with more min G assist. Most assist needed to boost from EOB. Pt also requires assist for lower body dressing at this time with inability to doff or don socks seated at EOB. Pt will benefit from continued OT in the hospital and recommended venue below to increase strength, balance, and endurance for safe ADL's.         Recommendations for follow up therapy are one component of a multi-disciplinary discharge planning process, led by the attending physician.  Recommendations may be updated based on patient status, additional functional criteria and insurance authorization.   Follow Up Recommendations  Skilled nursing-short term rehab (<3 hours/day)    Assistance Recommended at Discharge Intermittent Supervision/Assistance  Patient can return home with the following A lot of help with walking and/or transfers;A lot of help with bathing/dressing/bathroom;Assistance with cooking/housework;Assist for transportation;Help with stairs or ramp for entrance    Functional Status Assessment  Patient has had a recent decline in their functional status and demonstrates the ability to make significant improvements in function in a reasonable and predictable amount of time.  Equipment Recommendations  None recommended by OT    Recommendations for Other Services       Precautions / Restrictions Precautions Precautions: Fall Restrictions Weight Bearing Restrictions: No      Mobility Bed Mobility Overal bed mobility: Needs Assistance Bed Mobility: Supine to Sit     Supine to sit: Min guard     General bed mobility comments: per PT note. No physical assist needed.  Transfers Overall transfer level: Needs assistance Equipment used: Rolling walker (2 wheels) Transfers: Sit to/from Stand Sit to Stand: Mod assist, Max assist           General transfer comment: Pt completed ambulatory transfer to  chair from EOB as lsited in ADL section.      Balance Overall balance assessment: Needs assistance Sitting-balance support: Feet supported, No upper extremity supported Sitting balance-Leahy Scale: Fair Sitting balance - Comments: fair/good seated at EOB   Standing balance support: During functional activity, Bilateral upper extremity supported, Reliant on assistive device for balance Standing balance-Leahy Scale: Poor Standing balance comment: fair/poor using RW                           ADL either performed or assessed with clinical judgement   ADL Overall ADL's : Needs assistance/impaired     Grooming: Min guard;Minimal assistance;Standing   Upper Body Bathing: Set up;Sitting   Lower Body Bathing: Maximal assistance;Sitting/lateral leans   Upper Body Dressing : Set up;Sitting   Lower Body Dressing: Maximal assistance;Sitting/lateral leans Lower Body Dressing Details (indicate cue type and reason): Pt reported inability to doff and don socks seated at EOB today. Reports using shoes at baseline. Toilet Transfer: Moderate assistance;Maximal assistance;Rolling walker (2 wheels);Ambulation Toilet Transfer Details (indicate cue type and reason): Simualted via EOB to chair transfer. Toileting- Clothing Manipulation and Hygiene: Maximal assistance;Moderate assistance;Sitting/lateral lean       Functional mobility during ADLs: Min guard;Minimal assistance;Rolling walker (2 wheels) General ADL Comments: Once in standing pt ambulates well with min G assist mostly.     Vision Baseline Vision/History: 0 No visual deficits Ability to See in Adequate Light: 0 Adequate Patient Visual Report: No change from baseline Vision Assessment?: No apparent visual deficits     Perception     Praxis      Pertinent Vitals/Pain Pain Assessment Pain Assessment: No/denies pain     Hand Dominance Right   Extremity/Trunk Assessment Upper Extremity Assessment Upper Extremity  Assessment: Generalized weakness   Lower Extremity Assessment Lower Extremity Assessment: Defer to PT evaluation   Cervical / Trunk Assessment Cervical / Trunk Assessment: Kyphotic   Communication Communication Communication: No difficulties   Cognition Arousal/Alertness: Awake/alert Behavior During Therapy: WFL for tasks assessed/performed Overall Cognitive Status: Within Functional Limits for tasks assessed                                                        Home Living Family/patient expects to be discharged to:: Private residence Living Arrangements: Children Available Help at Discharge: Family;Available PRN/intermittently Type of Home: House Home Access: Stairs to enter [employer] of Steps: 2 Entrance Stairs-Rails: Right Home Layout: One level     Bathroom Shower/Tub: Teacher, early years/pre: Handicapped height Bathroom Accessibility: Yes   Home Equipment: Conservation officer, nature (2 wheels);Cane - single point   Additional Comments: Taken per document review.      Prior Functioning/Environment Prior Level of Function : Needs assist       Physical Assist : Mobility (physical);ADLs (physical)     Mobility Comments: Patient reports ambulating at home independently with RW and intermittent SPC use. Not a community ambulator and currently not driving. (per PT) ADLs Comments: Pt reports Independent ADL with assist from family for IADL's.  OT Problem List: Decreased strength;Decreased activity tolerance;Impaired balance (sitting and/or standing);Obesity      OT Treatment/Interventions: Self-care/ADL training;Therapeutic exercise;Therapeutic activities;Patient/family education;Balance training    OT Goals(Current goals can be found in the care plan section) Acute Rehab OT Goals Patient Stated Goal: Open to rehab to get stronger. OT Goal Formulation: With patient Time For Goal Achievement: 01/13/22 Potential to  Achieve Goals: Good  OT Frequency: Min 2X/week    Co-evaluation PT/OT/SLP Co-Evaluation/Treatment: Yes Reason for Co-Treatment: To address functional/ADL transfers PT goals addressed during session: Mobility/safety with mobility;Balance;Proper use of DME;Strengthening/ROM OT goals addressed during session: ADL's and self-care                       End of Session Equipment Utilized During Treatment: Rolling walker (2 wheels)  Activity Tolerance: Patient tolerated treatment well Patient left: in chair;with call bell/phone within reach;with chair alarm set  OT Visit Diagnosis: Unsteadiness on feet (R26.81);Other abnormalities of gait and mobility (R26.89);Muscle weakness (generalized) (M62.81)                Time: 7858-8502 OT Time Calculation (min): 18 min Charges:  OT General Charges $OT Visit: 1 Visit OT Evaluation $OT Eval Low Complexity: 1 Low  Charliee Krenz OT, MOT  Larey Seat 12/30/2021, 9:54 AM

## 2021-12-30 NOTE — Progress Notes (Signed)
AP A308 [facility]) Hospital liaison note   Notified by Benay Pike [facility] CM/SW of patient/family request for [facility] Palliative service at [facility] after discharge.   [facility] hospital liaison will follow patient for discharge disposition.   Please call with any hospice or outpatient palliative care related questions.   Thank you for the opportunity to participate in this patient's care.   [place]  [facility] Liaison [phone]

## 2021-12-30 NOTE — Plan of Care (Signed)
  Problem: Acute Rehab OT Goals (only OT should resolve) Goal: Pt. Will Perform Grooming Flowsheets (Taken 12/30/2021 0956) Pt Will Perform Grooming:  with supervision  standing Goal: Pt. Will Perform Lower Body Bathing Flowsheets (Taken 12/30/2021 0956) Pt Will Perform Lower Body Bathing:  with modified independence  sitting/lateral leans  with adaptive equipment Goal: Pt. Will Perform Lower Body Dressing Flowsheets (Taken 12/30/2021 0956) Pt Will Perform Lower Body Dressing:  with modified independence  sitting/lateral leans  with adaptive equipment Goal: Pt. Will Transfer To Toilet Flowsheets (Taken 12/30/2021 [phone]) Pt Will Transfer to Toilet:  with min guard assist  with min assist  stand pivot transfer Goal: Pt/Caregiver Will Perform Home Exercise Program Flowsheets (Taken 12/30/2021 0956) Pt/caregiver will Perform Home Exercise Program:  Increased strength  Both right and left upper extremity  Independently  Delorice Bannister OT, MOT

## 2021-12-30 NOTE — Progress Notes (Signed)
Physical Therapy Treatment Patient Details Name: Chelsea Meza MRN: [id number] DOB: 12-Dec-1935 Today's Date: 12/30/2021   History of Present Illness Chelsea Meza is a 86 year old female with a history of hypertension, hyperlipidemia, diabetes mellitus type 2, CKD stage IV, CHF, atrial fibrillation, hyperlipidemia presenting from [facility] secondary to altered mental status.  The patient was recently hospitalized from 12/16/2021 to 12/17/2021 secondary to acute on chronic renal failure.  She was discharged to [facility] at that time.  She states that she has been asking for bedtime snack around 8 PM the last 2 evenings, but she has not received them.  The patient's daughter called her earlier in the morning on 12/21/2021.  Apparently the patient was confused.  CBG was ultimately checked and she was noted to have a sugar of 12.  The patient was given glucagon by nursing home staff.  Glucose improved to 36.  Upon EMS arrival, her sugar improved to 73.  Nevertheless, the patient was brought to the emergency department for further evaluation and treatment.  The patient denies any fevers, chills, chest pain, shortness breath, cough, hemoptysis, nausea, vomiting, direct abdominal pain, dysuria, hematuria.  She states that her appetite has been pretty good.  In the ED, the patient was afebrile and hemodynamically stable with oxygen saturation 100% room air.  BMP showed sodium 134, potassium 4.3, bicarbonate 22, glucose 64, serum creatinine 4.19.  LFTs were unremarkable.  WBC 4.7, hemoglobin 10.8, platelets 234,000.  EKG showed atrial fibrillation with no ST-T wave changes.  The patient was not actually noted to be hypothermic with a temperature 92.7.  She was placed on a warming blanket.    PT Comments    Patient with labored mobility requiring increased time due to generalized weakness. She demonstrates good sitting balance and tolerance at EOB while completing exercises. She requires mod/max  assist, cueing, and use of RW to transfer to standing secondary to BLE weakness. Patient able to walk increased distance today with RW and assist to chair on other side of room. Patient will benefit from continued skilled physical therapy in hospital and recommended venue below to increase strength, balance, endurance for safe ADLs and gait.    Recommendations for follow up therapy are one component of a multi-disciplinary discharge planning process, led by the attending physician.  Recommendations may be updated based on patient status, additional functional criteria and insurance authorization.  Follow Up Recommendations  Skilled nursing-short term rehab (<3 hours/day) Can patient physically be transported by private vehicle: Yes   Assistance Recommended at Discharge Intermittent Supervision/Assistance  Patient can return home with the following A lot of help with walking and/or transfers;A little help with bathing/dressing/bathroom;Assistance with cooking/housework;Help with stairs or ramp for entrance   Equipment Recommendations  None recommended by PT    Recommendations for Other Services       Precautions / Restrictions Precautions Precautions: Fall Restrictions Weight Bearing Restrictions: No     Mobility  Bed Mobility Overal bed mobility: Needs Assistance Bed Mobility: Supine to Sit     Supine to sit: Min guard     General bed mobility comments: labored, increased time to complete, required side bar to pull to assist with supine to sit    Transfers Overall transfer level: Needs assistance Equipment used: Rolling walker (2 wheels) Transfers: Sit to/from Stand Sit to Stand: Mod assist, Max assist           General transfer comment: requires cueing and mod/max assist to power up to standing with  RW due to generalized weakness    Ambulation/Gait Ambulation/Gait assistance: Min Web designer (Feet): 15 Feet Assistive device: Rolling walker (2  wheels) Gait Pattern/deviations: Decreased step length - right, Decreased step length - left, Decreased stride length Gait velocity: decreased     General Gait Details: labored cadence to walk to chair on other side of room with RW   Stairs             Wheelchair Mobility    Modified Rankin (Stroke Patients Only)       Balance Overall balance assessment: Needs assistance Sitting-balance support: Feet supported, No upper extremity supported Sitting balance-Leahy Scale: Fair Sitting balance - Comments: fair/good seated at EOB   Standing balance support: During functional activity, Bilateral upper extremity supported, Reliant on assistive device for balance Standing balance-Leahy Scale: Poor Standing balance comment: fair/poor using RW                            Cognition Arousal/Alertness: Awake/alert Behavior During Therapy: WFL for tasks assessed/performed Overall Cognitive Status: Within Functional Limits for tasks assessed                                          Exercises General Exercises - Lower Extremity Long Arc Quad: Seated, AROM, Strengthening, Both, 10 reps Hip Flexion/Marching: AROM, Both, 10 reps, Seated Toe Raises: Seated, AROM, Strengthening, Both, 10 reps Heel Raises: Seated, AROM, Strengthening, Both, 10 reps    General Comments        Pertinent Vitals/Pain Pain Assessment Pain Assessment: No/denies pain    Home Living                          Prior Function            PT Goals (current goals can now be found in the care plan section) Acute Rehab PT Goals Patient Stated Goal: return home after rehab PT Goal Formulation: With patient/family Time For Goal Achievement: 01/05/22 Potential to Achieve Goals: Good Progress towards PT goals: Progressing toward goals    Frequency    Min 3X/week      PT Plan Current plan remains appropriate    Co-evaluation PT/OT/SLP Co-Evaluation/Treatment:  Yes Reason for Co-Treatment: To address functional/ADL transfers PT goals addressed during session: Mobility/safety with mobility;Balance;Proper use of DME;Strengthening/ROM        AM-PAC PT "6 Clicks" Mobility   Outcome Measure  Help needed turning from your back to your side while in a flat bed without using bedrails?: None Help needed moving from lying on your back to sitting on the side of a flat bed without using bedrails?: A Little Help needed moving to and from a bed to a chair (including a wheelchair)?: A Little Help needed standing up from a chair using your arms (e.g., wheelchair or bedside chair)?: A Lot Help needed to walk in hospital room?: A Lot Help needed climbing 3-5 steps with a railing? : A Lot 6 Click Score: 16    End of Session Equipment Utilized During Treatment: Gait belt Activity Tolerance: Patient tolerated treatment well;Patient limited by fatigue Patient left: in chair;with call bell/phone within reach;with chair alarm set Nurse Communication: Mobility status PT Visit Diagnosis: Unsteadiness on feet (R26.81);Other abnormalities of gait and mobility (R26.89);Muscle weakness (generalized) (M62.81)     Time: 0865-7846  PT Time Calculation (min) (ACUTE ONLY): 19 min  Charges:  $Therapeutic Activity: 8-22 mins                     9:44 AM, 12/30/21 Mearl Latin PT, DPT Physical Therapist at [facility]

## 2021-12-31 DIAGNOSIS — N179 Acute kidney failure, unspecified: Secondary | ICD-10-CM | POA: Diagnosis not present

## 2021-12-31 DIAGNOSIS — E875 Hyperkalemia: Secondary | ICD-10-CM | POA: Diagnosis not present

## 2021-12-31 DIAGNOSIS — I10 Essential (primary) hypertension: Secondary | ICD-10-CM | POA: Diagnosis not present

## 2021-12-31 DIAGNOSIS — I4891 Unspecified atrial fibrillation: Secondary | ICD-10-CM | POA: Diagnosis not present

## 2021-12-31 LAB — RENAL FUNCTION PANEL
Albumin: 2.3 g/dL — ABNORMAL LOW (ref 3.5–5.0)
Anion gap: 7 (ref 5–15)
BUN: 122 mg/dL — ABNORMAL HIGH (ref 8–23)
CO2: 23 mmol/L (ref 22–32)
Calcium: 8.5 mg/dL — ABNORMAL LOW (ref 8.9–10.3)
Chloride: 102 mmol/L (ref 98–111)
Creatinine, Ser: 4.48 mg/dL — ABNORMAL HIGH (ref 0.44–1.00)
GFR, Estimated: 9 mL/min — ABNORMAL LOW (ref >60)
Glucose, Bld: 145 mg/dL — ABNORMAL HIGH (ref 70–99)
Phosphorus: 7.9 mg/dL — ABNORMAL HIGH (ref 2.5–4.6)
Potassium: 5.6 mmol/L — ABNORMAL HIGH (ref 3.5–5.1)
Sodium: 132 mmol/L — ABNORMAL LOW (ref 135–145)

## 2021-12-31 LAB — GLUCOSE, CAPILLARY
Glucose-Capillary: 144 mg/dL — ABNORMAL HIGH (ref 70–99)
Glucose-Capillary: 191 mg/dL — ABNORMAL HIGH (ref 70–99)
Glucose-Capillary: 185 mg/dL — ABNORMAL HIGH (ref 70–99)
Glucose-Capillary: 228 mg/dL — ABNORMAL HIGH (ref 70–99)

## 2021-12-31 MED ORDER — SODIUM POLYSTYRENE SULFONATE 15 GM/60ML PO SUSP
30.0000 g | Freq: Once | ORAL | Status: AC
  Administered 2021-12-31: 30 g via ORAL
  Filled 2021-12-31: qty 120

## 2021-12-31 NOTE — Progress Notes (Signed)
Pt has been pleasant and cooperative this writers shift. No c/o pain or discomfort, resting in bed comfortably at this time. All needs met. Pt did refuse to ambulate even with encouragement. Daughter at bedside.

## 2021-12-31 NOTE — Progress Notes (Signed)
PROGRESS NOTE  Chelsea Meza:[id number] DOB: [birth year] DOA: 12/21/2021 PCP: Hal Morales, DO  Brief History:  86 year old female with a history of hypertension, hyperlipidemia, diabetes mellitus type 2, CKD stage IV, CHF, atrial fibrillation, hyperlipidemia presenting from [facility] secondary to altered mental status.  The patient was recently hospitalized from 12/16/2021 to 12/17/2021 secondary to acute on chronic renal failure.  She was discharged to [facility] at that time.  She states that she has been asking for bedtime snack around 8 PM the last 2 evenings, but she has not received them.  The patient's daughter called her earlier in the morning on 12/21/2021.  Apparently the patient was confused.  CBG was ultimately checked and she was noted to have a sugar of 12.  The patient was given glucagon by nursing home staff.  Glucose improved to 36.  Upon EMS arrival, her sugar improved to 73.  Nevertheless, the patient was brought to the emergency department for further evaluation and treatment.  The patient denies any fevers, chills, chest pain, shortness breath, cough, hemoptysis, nausea, vomiting, direct abdominal pain, dysuria, hematuria.  She states that her appetite has been pretty good. In the ED, the patient was afebrile and hemodynamically stable with oxygen saturation 100% room air.  BMP showed sodium 134, potassium 4.3, bicarbonate 22, glucose 64, serum creatinine 4.19.  LFTs were unremarkable.  WBC 4.7, hemoglobin 10.8, platelets 234,000.  EKG showed atrial fibrillation with no ST-T wave changes.  The patient was not actually noted to be hypothermic with a temperature 92.7.  She was placed on a warming blanket. Prolonged hospitalization due to slow renal recovery.  Nephrology consulted and following     Assessment and Plan: * Acute renal failure superimposed on stage 4 chronic kidney disease, unspecified acute renal failure type ([place]) Secondary to volume  depletion and hemodynamic changes Presented with serum creatinine 4.19 Baseline creatinine 3.0-3.3 D/c IVF, pt on hypervolemic side Not much improvement of renal function, likely progression of CKD Renal consult appreciated Discussed with pt and daughter about HD>>noncommital but likely would agree when time arrives or if sense of urgency >>BUN/Creatinine appear to be consistently trending up over last few days -Planning to follow renal function over the weekend -Hold any anticoagulation for now in case dialysis catheter needed on Monday -Discussed with patient's daughter today, and she reports that patient is now agreeable for dialysis   A-fib ([place]) Type unspecified Rate controlled Holding apixaban in case any procedures are needed Continue coreg  Essential hypertension Continue amlodipine, carvedilol, hydralazine  Hyperkalemia Continue twice daily Lokelma -Since potassium continues to trend up, will give 1 dose of Kayexalate today Repeat BMP in am  Hypothermia Presented with temperature 92.7 Check cortisol level--16.6 12/16/2021 TSH 6.574, free T4--1.22 Check PCT--0.50 Blood culture x2 sets--neg to date -improved  Chronic diastolic CHF (congestive heart failure) ([place]) Home lasix dose restarted 12/23/21 10/27/2021 echo EF 60 to 65%, no WMA, trivial MR  Hypoglycemia associated with type 2 diabetes mellitus ([place]) Suspect variable oral intake in the setting of long-acting Lantus and short acting insulin with worsen renal funciton  Received Humalog 6 units 3 times daily at SNF Holding Lantus NovoLog sliding scale--sensitive scale 10/26/2021 hemoglobin A1c 6.7 No further hypoglycemic episodes  Hyperlipidemia Continue statin  Goals of care -palliative care following -patient now DNR    Family Communication:   daughter updated at bedside 8/28. Tried reaching daughter over phone on 8/31 but was unsuccessful  Consultants:  nephrology, palliative care   Code Status:   DNR    DVT Prophylaxis: SCDs     Procedures: As Listed in Progress Note Above   Antibiotics: None     Subjective: Eating her meal at the time of my visit.  Denies any shortness of breath, nausea or vomiting   Objective: Vitals:   12/30/21 1330 12/30/21 2043 12/31/21 0440 12/31/21 1341  BP: (!) 140/73 (!) 170/72 138/77 133/64  Pulse: 75 74 74 77  Resp:  20    Temp: (!) 97.5 F (36.4 C) 97.7 F (36.5 C) 98 F (36.7 C) (!) 97 F (36.1 C)  TempSrc: Oral   Oral  SpO2: 100% 100% 100% 100%  Weight:      Height:        Intake/Output Summary (Last 24 hours) at 12/31/2021 1903 Last data filed at 12/31/2021 1800 Gross per 24 hour  Intake 1070 ml  Output 900 ml  Net 170 ml    Weight change:  Exam:  General:  Pt is alert, follows commands appropriately, not in acute distress HEENT: No icterus, No thrush, No neck mass, [place]/AT Cardiovascular: IRRR, S1/S2, no rubs, no gallops Respiratory: bibasilar crackles. No wheeze Abdomen: Soft/+BS, non tender, non distended, no guarding Extremities: 1+LE  edema, No lymphangitis, No petechiae, No rashes, no synovitis   Data Reviewed: I have personally reviewed following labs and imaging studies Basic Metabolic Panel: Recent Labs  Lab 12/27/21 0424 12/28/21 0346 12/29/21 0451 12/30/21 0402 12/31/21 0500  NA 132* 131* 130* 130* 132*  K 5.5* 5.6* 5.4* 5.3* 5.6*  CL 104 103 100 101 102  CO2 21* 21* 20* 19* 23  GLUCOSE 126* 151* 172* 160* 145*  BUN 110* 108* 116* 118* 122*  CREATININE 4.28* 4.36* 4.61* 4.46* 4.48*  CALCIUM 8.3* 8.1* 8.0* 8.3* 8.5*  PHOS 7.2* 7.5* 8.0* 7.6* 7.9*   Liver Function Tests: Recent Labs  Lab 12/27/21 0424 12/28/21 0346 12/29/21 0451 12/30/21 0402 12/31/21 0500  ALBUMIN 2.3* 2.1* 2.3* 2.3* 2.3*   No results for input(s): "LIPASE", "AMYLASE" in the last 168 hours. No results for input(s): "AMMONIA" in the last 168 hours. Coagulation Profile: No results for input(s): "INR", "PROTIME" in the last  168 hours. CBC: Recent Labs  Lab 12/25/21 0617 12/26/21 0858 12/28/21 0905 12/29/21 0451 12/30/21 0922  WBC 5.8 5.4 5.9 5.8 5.9  HGB 7.7* 8.6* 8.2* 7.5* 8.5*  HCT 23.1* 27.1* 25.9* 22.8* 26.1*  MCV 90.9 93.8 94.2 89.8 90.6  PLT 221 259 226 266 340   Cardiac Enzymes: No results for input(s): "CKTOTAL", "CKMB", "CKMBINDEX", "TROPONINI" in the last 168 hours. BNP: Invalid input(s): "POCBNP" CBG: Recent Labs  Lab 12/30/21 1608 12/30/21 2045 12/31/21 0747 12/31/21 1126 12/31/21 1616  GLUCAP 203* 220* 144* 191* 185*   HbA1C: No results for input(s): "HGBA1C" in the last 72 hours. Urine analysis:    Component Value Date/Time   COLORURINE YELLOW 12/27/2021 0810   APPEARANCEUR CLEAR 12/27/2021 0810   LABSPEC 1.010 12/27/2021 0810   PHURINE 5.0 12/27/2021 0810   GLUCOSEU NEGATIVE 12/27/2021 0810   HGBUR NEGATIVE 12/27/2021 0810   BILIRUBINUR NEGATIVE 12/27/2021 0810   KETONESUR NEGATIVE 12/27/2021 0810   PROTEINUR 100 (A) 12/27/2021 0810   UROBILINOGEN 0.2 07/13/2012 1236   NITRITE NEGATIVE 12/27/2021 0810   LEUKOCYTESUR NEGATIVE 12/27/2021 0810   Sepsis Labs: '@LABRCNTIP'$ (procalcitonin:4,lacticidven:4) ) No results found for this or any previous visit (from the past 240 hour(s)).    Scheduled Meds:  amLODipine  10  mg Oral Daily   atorvastatin  40 mg Oral QHS   carvedilol  3.125 mg Oral BID WC   darbepoetin (ARANESP) injection - NON-DIALYSIS  200 mcg Subcutaneous Q Thu-1800   feeding supplement  237 mL Oral BID BM   hydrALAZINE  100 mg Oral BID   insulin aspart  0-5 Units Subcutaneous QHS   insulin aspart  0-9 Units Subcutaneous TID WC   polyethylene glycol  17 g Oral Daily   polyvinyl alcohol  1 drop Both Eyes TID   senna  2 tablet Oral Daily   sodium zirconium cyclosilicate  10 g Oral BID   Continuous Infusions:  ferric gluconate (FERRLECIT) IVPB 250 mg (12/31/21 1210)    Procedures/Studies: US RENAL  Result Date: 12/27/2021 CLINICAL DATA:  Acute kidney  insufficiency on admission. EXAM: RENAL / URINARY TRACT ULTRASOUND COMPLETE COMPARISON:  Renal ultrasound 04/05/2020, CT abdomen and pelvis 02/21/2021 FINDINGS: Right Kidney: Renal measurements: 9.7 x 4.7 x 5.4 cm = volume: 129 mL. Increased cortical echogenicity as can be seen with medical renal disease, similar to prior. Lateral right midpole hypoechoic cyst measuring up to 1.1 cm, benign. No suspicious right renal mass is seen. Left Kidney: Limited evaluation of the left kidney due to overlying bowel gas. Renal measurements: Maximally 9.0 x 4.5 x 3.2 cm = volume: Approximately 67 mL. There again appears to be increased echogenicity of the left renal cortex as can be seen with medical renal disease. No gross cyst or solid mass is seen. Bladder: Only mildly distended, limiting evaluation. Other: None. IMPRESSION: 1. Evaluation of the left kidney is limited by overlying bowel gas. No hydronephrosis is seen either side. 2. There is again increased cortical echogenicity as can be seen with medical renal disease. Electronically Signed   By: Yvonne Kendall M.D.   On: 12/27/2021 10:47   DG Chest Portable 1 View  Result Date: 12/16/2021 CLINICAL DATA:  Difficult the rising to standing position. EXAM: PORTABLE CHEST 1 VIEW COMPARISON:  11/10/2021 FINDINGS: Lungs are hypoinflated and otherwise clear. Mild stable cardiomegaly. Remainder of the exam is unchanged. IMPRESSION: Hypoinflation without acute cardiopulmonary disease. Electronically Signed   By: Marin Olp M.D.   On: 12/16/2021 13:15    Kathie Dike, MD  [facility]  If 7PM-7AM, please contact night-coverage [email or web address] 12/31/2021, 7:03 PM   LOS: 10 days

## 2022-01-01 DIAGNOSIS — E875 Hyperkalemia: Secondary | ICD-10-CM | POA: Diagnosis not present

## 2022-01-01 DIAGNOSIS — I4891 Unspecified atrial fibrillation: Secondary | ICD-10-CM | POA: Diagnosis not present

## 2022-01-01 DIAGNOSIS — I10 Essential (primary) hypertension: Secondary | ICD-10-CM | POA: Diagnosis not present

## 2022-01-01 DIAGNOSIS — N179 Acute kidney failure, unspecified: Secondary | ICD-10-CM | POA: Diagnosis not present

## 2022-01-01 LAB — RENAL FUNCTION PANEL
Albumin: 2.3 g/dL — ABNORMAL LOW (ref 3.5–5.0)
Anion gap: 9 (ref 5–15)
BUN: 124 mg/dL — ABNORMAL HIGH (ref 8–23)
CO2: 22 mmol/L (ref 22–32)
Calcium: 8.4 mg/dL — ABNORMAL LOW (ref 8.9–10.3)
Chloride: 101 mmol/L (ref 98–111)
Creatinine, Ser: 4.55 mg/dL — ABNORMAL HIGH (ref 0.44–1.00)
GFR, Estimated: 9 mL/min — ABNORMAL LOW (ref >60)
Glucose, Bld: 182 mg/dL — ABNORMAL HIGH (ref 70–99)
Phosphorus: 7.8 mg/dL — ABNORMAL HIGH (ref 2.5–4.6)
Potassium: 5.5 mmol/L — ABNORMAL HIGH (ref 3.5–5.1)
Sodium: 132 mmol/L — ABNORMAL LOW (ref 135–145)

## 2022-01-01 LAB — HEPATITIS B SURFACE ANTIGEN: Hepatitis B Surface Ag: NONREACTIVE

## 2022-01-01 LAB — GLUCOSE, CAPILLARY
Glucose-Capillary: 145 mg/dL — ABNORMAL HIGH (ref 70–99)
Glucose-Capillary: 200 mg/dL — ABNORMAL HIGH (ref 70–99)
Glucose-Capillary: 195 mg/dL — ABNORMAL HIGH (ref 70–99)
Glucose-Capillary: 215 mg/dL — ABNORMAL HIGH (ref 70–99)

## 2022-01-01 LAB — HEPATITIS B SURFACE ANTIBODY,QUALITATIVE: Hep B S Ab: NONREACTIVE

## 2022-01-01 LAB — HEPATITIS C ANTIBODY: HCV Ab: NONREACTIVE

## 2022-01-01 LAB — HEPATITIS B CORE ANTIBODY, TOTAL: Hep B Core Total Ab: NONREACTIVE

## 2022-01-01 MED ORDER — CHLORHEXIDINE GLUCONATE CLOTH 2 % EX PADS
6.0000 | MEDICATED_PAD | Freq: Every day | CUTANEOUS | Status: DC
  Administered 2022-01-02 – 2022-01-03 (×2): 6 via TOPICAL

## 2022-01-01 MED ORDER — SODIUM POLYSTYRENE SULFONATE 15 GM/60ML PO SUSP
30.0000 g | Freq: Once | ORAL | Status: AC
  Administered 2022-01-01: 30 g via ORAL
  Filled 2022-01-01: qty 120

## 2022-01-01 NOTE — Progress Notes (Signed)
PROGRESS NOTE  Chelsea Meza BTD:[id number] DOB: May 15, 1935 DOA: 12/21/2021 PCP: Hal Morales, DO  Brief History:  86 year old female with a history of hypertension, hyperlipidemia, diabetes mellitus type 2, CKD stage IV, CHF, atrial fibrillation, hyperlipidemia presenting from [facility] secondary to altered mental status.  The patient was recently hospitalized from 12/16/2021 to 12/17/2021 secondary to acute on chronic renal failure.  She was discharged to [facility] at that time.  She states that she has been asking for bedtime snack around 8 PM the last 2 evenings, but she has not received them.  The patient's daughter called her earlier in the morning on 12/21/2021.  Apparently the patient was confused.  CBG was ultimately checked and she was noted to have a sugar of 12.  The patient was given glucagon by nursing home staff.  Glucose improved to 36.  Upon EMS arrival, her sugar improved to 73.  Nevertheless, the patient was brought to the emergency department for further evaluation and treatment.  The patient denies any fevers, chills, chest pain, shortness breath, cough, hemoptysis, nausea, vomiting, direct abdominal pain, dysuria, hematuria.  She states that her appetite has been pretty good. In the ED, the patient was afebrile and hemodynamically stable with oxygen saturation 100% room air.  BMP showed sodium 134, potassium 4.3, bicarbonate 22, glucose 64, serum creatinine 4.19.  LFTs were unremarkable.  WBC 4.7, hemoglobin 10.8, platelets 234,000.  EKG showed atrial fibrillation with no ST-T wave changes.  The patient was not actually noted to be hypothermic with a temperature 92.7.  She was placed on a warming blanket. Prolonged hospitalization due to slow renal recovery.  Nephrology consulted and following     Assessment and Plan: * Acute renal failure superimposed on stage 4 chronic kidney disease, unspecified acute renal failure type ([place]) Secondary to volume  depletion and hemodynamic changes Presented with serum creatinine 4.19 Baseline creatinine 3.0-3.3 D/c IVF, pt on hypervolemic side Not much improvement of renal function, likely progression of CKD Renal consult appreciated Discussed with pt and daughter about HD>>noncommital but likely would agree when time arrives or if sense of urgency >>BUN/Creatinine appear to be consistently trending up over last few days -Planning to follow renal function over the weekend -Hold any anticoagulation for now in case dialysis catheter needed on Monday -Discussed with patient's daughter and she reports that patient is now agreeable for dialysis -Discussed with general surgery patient will likely have dialysis catheter placed at any time on 9/5   A-fib [facility]) Type unspecified Rate controlled Holding apixaban in case any procedures are needed Continue coreg  Essential hypertension Continue amlodipine, carvedilol, hydralazine  Hyperkalemia Continue twice daily Lokelma -Since potassium continues to trend up, will give 1 dose of Kayexalate today Repeat BMP in am  Hypothermia Presented with temperature 92.7 Check cortisol level--16.6 12/16/2021 TSH 6.574, free T4--1.22 Check PCT--0.50 Blood culture x2 sets--neg to date -improved  Chronic diastolic CHF (congestive heart failure) ([place]) Home lasix dose restarted 12/23/21 10/27/2021 echo EF 60 to 65%, no WMA, trivial MR  Hypoglycemia associated with type 2 diabetes mellitus ([place]) Suspect variable oral intake in the setting of long-acting Lantus and short acting insulin with worsen renal funciton  Received Humalog 6 units 3 times daily at SNF Holding Lantus NovoLog sliding scale--sensitive scale 10/26/2021 hemoglobin A1c 6.7 No further hypoglycemic episodes  Hyperlipidemia Continue statin  Goals of care -palliative care following -patient now DNR    Family Communication:   daughter  updated at bedside 8/28. Tried reaching daughter over phone  on 8/31 but was unsuccessful   Consultants:  nephrology, palliative care   Code Status:  DNR    DVT Prophylaxis: SCDs     Procedures: As Listed in Progress Note Above   Antibiotics: None     Subjective: She does not have any new complaints   Objective: Vitals:   12/31/21 1341 12/31/21 2117 01/01/22 0520 01/01/22 1305  BP: 133/64 (!) 143/78 (!) 142/69 135/69  Pulse: 77 95 76 73  Resp:      Temp: (!) 97 F (36.1 C) 98.3 F (36.8 C) 97.7 F (36.5 C) 97.6 F (36.4 C)  TempSrc: Oral Oral Oral Oral  SpO2: 100% 100% 98% 100%  Weight:      Height:        Intake/Output Summary (Last 24 hours) at 01/01/2022 1845 Last data filed at 01/01/2022 1300 Gross per 24 hour  Intake 979.7 ml  Output 1150 ml  Net -170.3 ml    Weight change:  Exam:  General:  Pt is alert, follows commands appropriately, not in acute distress HEENT: No icterus, No thrush, No neck mass, [place]/AT Cardiovascular: IRRR, S1/S2, no rubs, no gallops Respiratory: bibasilar crackles. No wheeze Abdomen: Soft/+BS, non tender, non distended, no guarding Extremities: 1+LE  edema, No lymphangitis, No petechiae, No rashes, no synovitis   Data Reviewed: I have personally reviewed following labs and imaging studies Basic Metabolic Panel: Recent Labs  Lab 12/28/21 0346 12/29/21 0451 12/30/21 0402 12/31/21 0500 01/01/22 0447  NA 131* 130* 130* 132* 132*  K 5.6* 5.4* 5.3* 5.6* 5.5*  CL 103 100 101 102 101  CO2 21* 20* 19* 23 22  GLUCOSE 151* 172* 160* 145* 182*  BUN 108* 116* 118* 122* 124*  CREATININE 4.36* 4.61* 4.46* 4.48* 4.55*  CALCIUM 8.1* 8.0* 8.3* 8.5* 8.4*  PHOS 7.5* 8.0* 7.6* 7.9* 7.8*   Liver Function Tests: Recent Labs  Lab 12/28/21 0346 12/29/21 0451 12/30/21 0402 12/31/21 0500 01/01/22 0447  ALBUMIN 2.1* 2.3* 2.3* 2.3* 2.3*   No results for input(s): "LIPASE", "AMYLASE" in the last 168 hours. No results for input(s): "AMMONIA" in the last 168 hours. Coagulation Profile: No results  for input(s): "INR", "PROTIME" in the last 168 hours. CBC: Recent Labs  Lab 12/26/21 0858 12/28/21 0905 12/29/21 0451 12/30/21 0922  WBC 5.4 5.9 5.8 5.9  HGB 8.6* 8.2* 7.5* 8.5*  HCT 27.1* 25.9* 22.8* 26.1*  MCV 93.8 94.2 89.8 90.6  PLT 259 226 266 340   Cardiac Enzymes: No results for input(s): "CKTOTAL", "CKMB", "CKMBINDEX", "TROPONINI" in the last 168 hours. BNP: Invalid input(s): "POCBNP" CBG: Recent Labs  Lab 12/31/21 1616 12/31/21 2117 01/01/22 0745 01/01/22 1117 01/01/22 1622  GLUCAP 185* 228* 145* 200* 195*   HbA1C: No results for input(s): "HGBA1C" in the last 72 hours. Urine analysis:    Component Value Date/Time   COLORURINE YELLOW 12/27/2021 0810   APPEARANCEUR CLEAR 12/27/2021 0810   LABSPEC 1.010 12/27/2021 0810   PHURINE 5.0 12/27/2021 0810   GLUCOSEU NEGATIVE 12/27/2021 0810   HGBUR NEGATIVE 12/27/2021 0810   BILIRUBINUR NEGATIVE 12/27/2021 0810   KETONESUR NEGATIVE 12/27/2021 0810   PROTEINUR 100 (A) 12/27/2021 0810   UROBILINOGEN 0.2 07/13/2012 1236   NITRITE NEGATIVE 12/27/2021 0810   LEUKOCYTESUR NEGATIVE 12/27/2021 0810   Sepsis Labs: '@LABRCNTIP'$ (procalcitonin:4,lacticidven:4) ) No results found for this or any previous visit (from the past 240 hour(s)).    Scheduled Meds:  amLODipine  10 mg Oral Daily  atorvastatin  40 mg Oral QHS   carvedilol  3.125 mg Oral BID WC   [START ON 01/02/2022] Chlorhexidine Gluconate Cloth  6 each Topical Q0600   darbepoetin (ARANESP) injection - NON-DIALYSIS  200 mcg Subcutaneous Q Thu-1800   feeding supplement  237 mL Oral BID BM   hydrALAZINE  100 mg Oral BID   insulin aspart  0-5 Units Subcutaneous QHS   insulin aspart  0-9 Units Subcutaneous TID WC   polyethylene glycol  17 g Oral Daily   polyvinyl alcohol  1 drop Both Eyes TID   senna  2 tablet Oral Daily   sodium zirconium cyclosilicate  10 g Oral BID   Continuous Infusions:    Procedures/Studies: US RENAL  Result Date:  2022/01/11 CLINICAL DATA:  Acute kidney insufficiency on admission. EXAM: RENAL / URINARY TRACT ULTRASOUND COMPLETE COMPARISON:  Renal ultrasound 04/05/2020, CT abdomen and pelvis 02/21/2021 FINDINGS: Right Kidney: Renal measurements: 9.7 x 4.7 x 5.4 cm = volume: 129 mL. Increased cortical echogenicity as can be seen with medical renal disease, similar to prior. Lateral right midpole hypoechoic cyst measuring up to 1.1 cm, benign. No suspicious right renal mass is seen. Left Kidney: Limited evaluation of the left kidney due to overlying bowel gas. Renal measurements: Maximally 9.0 x 4.5 x 3.2 cm = volume: Approximately 67 mL. There again appears to be increased echogenicity of the left renal cortex as can be seen with medical renal disease. No gross cyst or solid mass is seen. Bladder: Only mildly distended, limiting evaluation. Other: None. IMPRESSION: 1. Evaluation of the left kidney is limited by overlying bowel gas. No hydronephrosis is seen either side. 2. There is again increased cortical echogenicity as can be seen with medical renal disease. Electronically Signed   By: Yvonne Kendall M.D.   On: Jan 11, 2022 10:47   DG Chest Portable 1 View  Result Date: 12/16/2021 CLINICAL DATA:  Difficult the rising to standing position. EXAM: PORTABLE CHEST 1 VIEW COMPARISON:  11/10/2021 FINDINGS: Lungs are hypoinflated and otherwise clear. Mild stable cardiomegaly. Remainder of the exam is unchanged. IMPRESSION: Hypoinflation without acute cardiopulmonary disease. Electronically Signed   By: Marin Olp M.D.   On: 12/16/2021 13:15    Kathie Dike, MD  [facility]  If 7PM-7AM, please contact night-coverage [email or web address] 01/01/2022, 6:45 PM   LOS: 11 days

## 2022-01-02 DIAGNOSIS — E875 Hyperkalemia: Secondary | ICD-10-CM | POA: Diagnosis not present

## 2022-01-02 DIAGNOSIS — N179 Acute kidney failure, unspecified: Secondary | ICD-10-CM | POA: Diagnosis not present

## 2022-01-02 DIAGNOSIS — Z7189 Other specified counseling: Secondary | ICD-10-CM | POA: Diagnosis not present

## 2022-01-02 DIAGNOSIS — I10 Essential (primary) hypertension: Secondary | ICD-10-CM | POA: Diagnosis not present

## 2022-01-02 DIAGNOSIS — I5032 Chronic diastolic (congestive) heart failure: Secondary | ICD-10-CM | POA: Diagnosis not present

## 2022-01-02 DIAGNOSIS — N184 Chronic kidney disease, stage 4 (severe): Secondary | ICD-10-CM | POA: Diagnosis not present

## 2022-01-02 DIAGNOSIS — I4891 Unspecified atrial fibrillation: Secondary | ICD-10-CM | POA: Diagnosis not present

## 2022-01-02 LAB — RENAL FUNCTION PANEL
Albumin: 2.1 g/dL — ABNORMAL LOW (ref 3.5–5.0)
Anion gap: 8 (ref 5–15)
BUN: 128 mg/dL — ABNORMAL HIGH (ref 8–23)
CO2: 22 mmol/L (ref 22–32)
Calcium: 8.1 mg/dL — ABNORMAL LOW (ref 8.9–10.3)
Chloride: 100 mmol/L (ref 98–111)
Creatinine, Ser: 4.56 mg/dL — ABNORMAL HIGH (ref 0.44–1.00)
GFR, Estimated: 9 mL/min — ABNORMAL LOW (ref >60)
Glucose, Bld: 177 mg/dL — ABNORMAL HIGH (ref 70–99)
Phosphorus: 7.6 mg/dL — ABNORMAL HIGH (ref 2.5–4.6)
Potassium: 5 mmol/L (ref 3.5–5.1)
Sodium: 130 mmol/L — ABNORMAL LOW (ref 135–145)

## 2022-01-02 LAB — GLUCOSE, CAPILLARY
Glucose-Capillary: 172 mg/dL — ABNORMAL HIGH (ref 70–99)
Glucose-Capillary: 223 mg/dL — ABNORMAL HIGH (ref 70–99)
Glucose-Capillary: 166 mg/dL — ABNORMAL HIGH (ref 70–99)
Glucose-Capillary: 153 mg/dL — ABNORMAL HIGH (ref 70–99)

## 2022-01-02 LAB — CBC
HCT: 21.7 % — ABNORMAL LOW (ref 36.0–46.0)
Hemoglobin: 7.1 g/dL — ABNORMAL LOW (ref 12.0–15.0)
MCH: 30.2 pg (ref 26.0–34.0)
MCHC: 32.7 g/dL (ref 30.0–36.0)
MCV: 92.3 fL (ref 80.0–100.0)
Platelets: 285 10*3/uL (ref 150–400)
RBC: 2.35 MIL/uL — ABNORMAL LOW (ref 3.87–5.11)
RDW: 13.9 % (ref 11.5–15.5)
WBC: 7.4 10*3/uL (ref 4.0–10.5)
nRBC: 0 % (ref 0.0–0.2)

## 2022-01-02 LAB — PREPARE RBC (CROSSMATCH)

## 2022-01-02 LAB — ABO/RH: ABO/RH(D): O POS

## 2022-01-02 MED ORDER — SODIUM CHLORIDE 0.9% IV SOLUTION: Freq: Once | INTRAVENOUS | Status: AC

## 2022-01-02 NOTE — Consult Note (Signed)
Reason for Consult: Need for dialysis catheter/dialysis Referring Physician: [name] is an 86 y.o. female.  HPI: Patient is an 86 year old black female with multiple medical problems who is suffering from acute on chronic kidney injury.  She has had worsening renal failure.  We have been asked to place a dialysis catheter for dialysis.  Patient has consented for dialysis.  Past Medical History:  Diagnosis Date   Anemia    Chronic atrial fibrillation ([name])    CKD (chronic kidney disease) stage 3, GFR 30-59 ml/min ([name])    Diabetes mellitus without complication ([name])    Hyperlipidemia    Hypertension    Localized edema    Other specified disorders of thyroid    TIA (transient ischemic attack)    Unspecified convulsions ([name])     Past Surgical History:  Procedure Laterality Date   CHOLECYSTECTOMY      Family History  Problem Relation Age of Onset   Stroke Mother    Stroke Maternal Grandmother     Social History:  reports that she has never smoked. She has never used smokeless tobacco. She reports that she does not drink alcohol and does not use drugs.  Allergies: No Known Allergies  Medications: I have reviewed the patient's current medications.  Results for orders placed or performed during the hospital encounter of 12/21/21 (from the past 48 hour(s))  Glucose, capillary     Status: Abnormal   Collection Time: 12/31/21 11:26 AM  Result Value Ref Range   Glucose-Capillary 191 (H) 70 - 99 mg/dL    Comment: Glucose reference range applies only to samples taken after fasting for at least 8 hours.  Glucose, capillary     Status: Abnormal   Collection Time: 12/31/21  4:16 PM  Result Value Ref Range   Glucose-Capillary 185 (H) 70 - 99 mg/dL    Comment: Glucose reference range applies only to samples taken after fasting for at least 8 hours.  Glucose, capillary     Status: Abnormal   Collection Time: 12/31/21  9:17 PM  Result Value Ref Range    Glucose-Capillary 228 (H) 70 - 99 mg/dL    Comment: Glucose reference range applies only to samples taken after fasting for at least 8 hours.  Renal function panel     Status: Abnormal   Collection Time: 01/01/22  4:47 AM  Result Value Ref Range   Sodium 132 (L) 135 - 145 mmol/L   Potassium 5.5 (H) 3.5 - 5.1 mmol/L   Chloride 101 98 - 111 mmol/L   CO2 22 22 - 32 mmol/L   Glucose, Bld 182 (H) 70 - 99 mg/dL    Comment: Glucose reference range applies only to samples taken after fasting for at least 8 hours.   BUN 124 (H) 8 - 23 mg/dL    Comment: RESULTS CONFIRMED BY MANUAL DILUTION   Creatinine, Ser 4.55 (H) 0.44 - 1.00 mg/dL   Calcium 8.4 (L) 8.9 - 10.3 mg/dL   Phosphorus 7.8 (H) 2.5 - 4.6 mg/dL   Albumin 2.3 (L) 3.5 - 5.0 g/dL   GFR, Estimated 9 (L) >60 mL/min    Comment: (NOTE) Calculated using the CKD-EPI Creatinine Equation (2021)    Anion gap 9 5 - 15    Comment: Performed at [facility], [address] [place], [place] [address]  Glucose, capillary     Status: Abnormal   Collection Time: 01/01/22  7:45 AM  Result Value Ref Range   Glucose-Capillary 145 (  H) 70 - 99 mg/dL    Comment: Glucose reference range applies only to samples taken after fasting for at least 8 hours.  Glucose, capillary     Status: Abnormal   Collection Time: 01/01/22 11:17 AM  Result Value Ref Range   Glucose-Capillary 200 (H) 70 - 99 mg/dL    Comment: Glucose reference range applies only to samples taken after fasting for at least 8 hours.  Hepatitis B surface antigen     Status: None   Collection Time: 01/01/22  2:34 PM  Result Value Ref Range   Hepatitis B Surface Ag NON REACTIVE NON REACTIVE    Comment: Performed at [facility] [address], [place], [place] [address]  Hepatitis B surface antibody     Status: None   Collection Time: 01/01/22  2:34 PM  Result Value Ref Range   Hep B S Ab NON REACTIVE NON REACTIVE    Comment: (NOTE) Inconsistent with immunity, less than 10  mIU/mL.  Performed at [facility], [place] [address], [place], [place] [address]   Hepatitis B core antibody, total     Status: None   Collection Time: 01/01/22  2:34 PM  Result Value Ref Range   Hep B Core Total Ab NON REACTIVE NON REACTIVE    Comment: Performed at [facility] [address], [place], [place] [address]  Hepatitis C antibody     Status: None   Collection Time: 01/01/22  2:34 PM  Result Value Ref Range   HCV Ab NON REACTIVE NON REACTIVE    Comment: (NOTE) Nonreactive HCV antibody screen is consistent with no HCV infections,  unless recent infection is suspected or other evidence exists to indicate HCV infection.  Performed at [facility], [place] [address] [place], [place] [address]   Glucose, capillary     Status: Abnormal   Collection Time: 01/01/22  4:22 PM  Result Value Ref Range   Glucose-Capillary 195 (H) 70 - 99 mg/dL    Comment: Glucose reference range applies only to samples taken after fasting for at least 8 hours.  Glucose, capillary     Status: Abnormal   Collection Time: 01/01/22  9:09 PM  Result Value Ref Range   Glucose-Capillary 215 (H) 70 - 99 mg/dL    Comment: Glucose reference range applies only to samples taken after fasting for at least 8 hours.  Renal function panel     Status: Abnormal   Collection Time: 01/02/22  4:50 AM  Result Value Ref Range   Sodium 130 (L) 135 - 145 mmol/L   Potassium 5.0 3.5 - 5.1 mmol/L   Chloride 100 98 - 111 mmol/L   CO2 22 22 - 32 mmol/L   Glucose, Bld 177 (H) 70 - 99 mg/dL    Comment: Glucose reference range applies only to samples taken after fasting for at least 8 hours.   BUN 128 (H) 8 - 23 mg/dL    Comment: RESULTS CONFIRMED BY MANUAL DILUTION   Creatinine, Ser 4.56 (H) 0.44 - 1.00 mg/dL   Calcium 8.1 (L) 8.9 - 10.3 mg/dL   Phosphorus 7.6 (H) 2.5 - 4.6 mg/dL   Albumin 2.1 (L) 3.5 - 5.0 g/dL   GFR, Estimated 9 (L) >60 mL/min    Comment: (NOTE) Calculated using the CKD-EPI  Creatinine Equation (2021)    Anion gap 8 5 - 15    Comment: Performed at [facility], [address], [place],  [address]  Glucose, capillary     Status: Abnormal   Collection Time: 01/02/22  7:47 AM  Result Value Ref Range   Glucose-Capillary 172 (H) 70 - 99 mg/dL    Comment: Glucose reference range applies only to samples taken after fasting for at least 8 hours.   Comment 1 Notify RN    Comment 2 Document in Chart   CBC     Status: Abnormal   Collection Time: 01/02/22  8:58 AM  Result Value Ref Range   WBC 7.4 4.0 - 10.5 K/uL   RBC 2.35 (L) 3.87 - 5.11 MIL/uL   Hemoglobin 7.1 (L) 12.0 - 15.0 g/dL   HCT 21.7 (L) 36.0 - 46.0 %   MCV 92.3 80.0 - 100.0 fL   MCH 30.2 26.0 - 34.0 pg   MCHC 32.7 30.0 - 36.0 g/dL   RDW 13.9 11.5 - 15.5 %   Platelets 285 150 - 400 K/uL   nRBC 0.0 0.0 - 0.2 %    Comment: Performed at [facility], [address] [place], [place] [address]    No results found.  ROS:  Pertinent items are noted in HPI.  Blood pressure 100/68, pulse 69, temperature 98.7 F (37.1 C), resp. rate 20, height '5\' 10"'$  (1.778 m), weight 74.5 kg, SpO2 100 %. Physical Exam: Pleasant black female no acute distress Head is normocephalic, atraumatic Lungs are clear to auscultation with equal breath sounds bilaterally Heart examination reveals an irregular rate and rhythm Labs reviewed Assessment/Plan: Impression: Acute on chronic renal failure, need for dialysis Plan: Patient will undergo dialysis catheter insertion by Dr. Constance Haw on 01/03/2022.  The risks and benefits of the procedure including bleeding, infection, and pneumothorax were fully explained to the patient, who gave informed consent.  She will receive 1 unit of packed red blood cells for hemoglobin of 7.1.  Orders have been placed.  Chelsea Meza 01/02/2022, 10:14 AM

## 2022-01-02 NOTE — TOC Progression Note (Addendum)
Transition of Care [facility]) - Progression Note    Patient Details  Name: Chelsea Meza MRN: [id number] Date of Birth: [birth year]  Transition of Care [facility]) CM/SW Contact  Boneta Lucks, RN Phone Number: 01/02/2022, 11:07 AM  Clinical Narrative:   Family decided to start dialysis. CLIP admission process started today for [facility].  TOC to follow.  Addendum:  [facility] will schedule her for MWF 2nd shift.   Expected Discharge Plan: Skilled Nursing Facility Barriers to Discharge: Continued Medical Work up  Expected Discharge Plan and Services Expected Discharge Plan: [place]     Readmission Risk Interventions    12/22/2021    2:27 PM 02/24/2021   12:38 PM  Readmission Risk Prevention Plan  Transportation Screening Complete Complete  PCP or Specialist Appt within 3-5 Days Complete   HRI or Home Care Consult Complete Patient refused  Social Work Consult for [place] Planning/Counseling Complete Complete  Palliative Care Screening Not Applicable Not Applicable  Medication Review [employer]) Complete Complete

## 2022-01-02 NOTE — H&P (View-Only) (Signed)
Reason for Consult: Need for dialysis catheter/dialysis Referring Physician: [name] is an 86 y.o. female.  HPI: Patient is an 86 year old black female with multiple medical problems who is suffering from acute on chronic kidney injury.  She has had worsening renal failure.  We have been asked to place a dialysis catheter for dialysis.  Patient has consented for dialysis.  Past Medical History:  Diagnosis Date   Anemia    Chronic atrial fibrillation ([name])    CKD (chronic kidney disease) stage 3, GFR 30-59 ml/min ([name])    Diabetes mellitus without complication ([name])    Hyperlipidemia    Hypertension    Localized edema    Other specified disorders of thyroid    TIA (transient ischemic attack)    Unspecified convulsions ([name])     Past Surgical History:  Procedure Laterality Date   CHOLECYSTECTOMY      Family History  Problem Relation Age of Onset   Stroke Mother    Stroke Maternal Grandmother     Social History:  reports that she has never smoked. She has never used smokeless tobacco. She reports that she does not drink alcohol and does not use drugs.  Allergies: No Known Allergies  Medications: I have reviewed the patient's current medications.  Results for orders placed or performed during the hospital encounter of 12/21/21 (from the past 48 hour(s))  Glucose, capillary     Status: Abnormal   Collection Time: 12/31/21 11:26 AM  Result Value Ref Range   Glucose-Capillary 191 (H) 70 - 99 mg/dL    Comment: Glucose reference range applies only to samples taken after fasting for at least 8 hours.  Glucose, capillary     Status: Abnormal   Collection Time: 12/31/21  4:16 PM  Result Value Ref Range   Glucose-Capillary 185 (H) 70 - 99 mg/dL    Comment: Glucose reference range applies only to samples taken after fasting for at least 8 hours.  Glucose, capillary     Status: Abnormal   Collection Time: 12/31/21  9:17 PM  Result Value Ref Range    Glucose-Capillary 228 (H) 70 - 99 mg/dL    Comment: Glucose reference range applies only to samples taken after fasting for at least 8 hours.  Renal function panel     Status: Abnormal   Collection Time: 01/01/22  4:47 AM  Result Value Ref Range   Sodium 132 (L) 135 - 145 mmol/L   Potassium 5.5 (H) 3.5 - 5.1 mmol/L   Chloride 101 98 - 111 mmol/L   CO2 22 22 - 32 mmol/L   Glucose, Bld 182 (H) 70 - 99 mg/dL    Comment: Glucose reference range applies only to samples taken after fasting for at least 8 hours.   BUN 124 (H) 8 - 23 mg/dL    Comment: RESULTS CONFIRMED BY MANUAL DILUTION   Creatinine, Ser 4.55 (H) 0.44 - 1.00 mg/dL   Calcium 8.4 (L) 8.9 - 10.3 mg/dL   Phosphorus 7.8 (H) 2.5 - 4.6 mg/dL   Albumin 2.3 (L) 3.5 - 5.0 g/dL   GFR, Estimated 9 (L) >60 mL/min    Comment: (NOTE) Calculated using the CKD-EPI Creatinine Equation (2021)    Anion gap 9 5 - 15    Comment: Performed at [facility], [address] [place], [place] [address]  Glucose, capillary     Status: Abnormal   Collection Time: 01/01/22  7:45 AM  Result Value Ref Range   Glucose-Capillary 145 (  H) 70 - 99 mg/dL    Comment: Glucose reference range applies only to samples taken after fasting for at least 8 hours.  Glucose, capillary     Status: Abnormal   Collection Time: 01/01/22 11:17 AM  Result Value Ref Range   Glucose-Capillary 200 (H) 70 - 99 mg/dL    Comment: Glucose reference range applies only to samples taken after fasting for at least 8 hours.  Hepatitis B surface antigen     Status: None   Collection Time: 01/01/22  2:34 PM  Result Value Ref Range   Hepatitis B Surface Ag NON REACTIVE NON REACTIVE    Comment: Performed at [facility] [address], [place], [place] [address]  Hepatitis B surface antibody     Status: None   Collection Time: 01/01/22  2:34 PM  Result Value Ref Range   Hep B S Ab NON REACTIVE NON REACTIVE    Comment: (NOTE) Inconsistent with immunity, less than 10  mIU/mL.  Performed at [facility], [place] [address] [place], [place] [address]   Hepatitis B core antibody, total     Status: None   Collection Time: 01/01/22  2:34 PM  Result Value Ref Range   Hep B Core Total Ab NON REACTIVE NON REACTIVE    Comment: Performed at [facility] [address], [place], [place] [address]  Hepatitis C antibody     Status: None   Collection Time: 01/01/22  2:34 PM  Result Value Ref Range   HCV Ab NON REACTIVE NON REACTIVE    Comment: (NOTE) Nonreactive HCV antibody screen is consistent with no HCV infections,  unless recent infection is suspected or other evidence exists to indicate HCV infection.  Performed at [facility], [place] [address], [place], [place] [address]   Glucose, capillary     Status: Abnormal   Collection Time: 01/01/22  4:22 PM  Result Value Ref Range   Glucose-Capillary 195 (H) 70 - 99 mg/dL    Comment: Glucose reference range applies only to samples taken after fasting for at least 8 hours.  Glucose, capillary     Status: Abnormal   Collection Time: 01/01/22  9:09 PM  Result Value Ref Range   Glucose-Capillary 215 (H) 70 - 99 mg/dL    Comment: Glucose reference range applies only to samples taken after fasting for at least 8 hours.  Renal function panel     Status: Abnormal   Collection Time: 01/02/22  4:50 AM  Result Value Ref Range   Sodium 130 (L) 135 - 145 mmol/L   Potassium 5.0 3.5 - 5.1 mmol/L   Chloride 100 98 - 111 mmol/L   CO2 22 22 - 32 mmol/L   Glucose, Bld 177 (H) 70 - 99 mg/dL    Comment: Glucose reference range applies only to samples taken after fasting for at least 8 hours.   BUN 128 (H) 8 - 23 mg/dL    Comment: RESULTS CONFIRMED BY MANUAL DILUTION   Creatinine, Ser 4.56 (H) 0.44 - 1.00 mg/dL   Calcium 8.1 (L) 8.9 - 10.3 mg/dL   Phosphorus 7.6 (H) 2.5 - 4.6 mg/dL   Albumin 2.1 (L) 3.5 - 5.0 g/dL   GFR, Estimated 9 (L) >60 mL/min    Comment: (NOTE) Calculated using the CKD-EPI  Creatinine Equation (2021)    Anion gap 8 5 - 15    Comment: Performed at [facility], [address] [place], [place] [address]  Glucose, capillary     Status: Abnormal   Collection Time: 01/02/22  7:47 AM  Result Value Ref Range   Glucose-Capillary 172 (H) 70 - 99 mg/dL    Comment: Glucose reference range applies only to samples taken after fasting for at least 8 hours.   Comment 1 Notify RN    Comment 2 Document in Chart   CBC     Status: Abnormal   Collection Time: 01/02/22  8:58 AM  Result Value Ref Range   WBC 7.4 4.0 - 10.5 K/uL   RBC 2.35 (L) 3.87 - 5.11 MIL/uL   Hemoglobin 7.1 (L) 12.0 - 15.0 g/dL   HCT 21.7 (L) 36.0 - 46.0 %   MCV 92.3 80.0 - 100.0 fL   MCH 30.2 26.0 - 34.0 pg   MCHC 32.7 30.0 - 36.0 g/dL   RDW 13.9 11.5 - 15.5 %   Platelets 285 150 - 400 K/uL   nRBC 0.0 0.0 - 0.2 %    Comment: Performed at [facility], [address] [place], [place] [address]    No results found.  ROS:  Pertinent items are noted in HPI.  Blood pressure 100/68, pulse 69, temperature 98.7 F (37.1 C), resp. rate 20, height '5\' 10"'$  (1.778 m), weight 74.5 kg, SpO2 100 %. Physical Exam: Pleasant black female no acute distress Head is normocephalic, atraumatic Lungs are clear to auscultation with equal breath sounds bilaterally Heart examination reveals an irregular rate and rhythm Labs reviewed Assessment/Plan: Impression: Acute on chronic renal failure, need for dialysis Plan: Patient will undergo dialysis catheter insertion by Dr. Constance Haw on 01/03/2022.  The risks and benefits of the procedure including bleeding, infection, and pneumothorax were fully explained to the patient, who gave informed consent.  She will receive 1 unit of packed red blood cells for hemoglobin of 7.1.  Orders have been placed.  Chelsea Meza 01/02/2022, 10:14 AM

## 2022-01-02 NOTE — Progress Notes (Signed)
PROGRESS NOTE  Chelsea Meza QVZ:[id number] DOB: [birth year] DOA: 12/21/2021 PCP: Hal Morales, DO  Brief History:  86 year old female with a history of hypertension, hyperlipidemia, diabetes mellitus type 2, CKD stage IV, CHF, atrial fibrillation, hyperlipidemia presenting from [facility] secondary to altered mental status.  The patient was recently hospitalized from 12/16/2021 to 12/17/2021 secondary to acute on chronic renal failure.  She was discharged to [facility] at that time.  She states that she has been asking for bedtime snack around 8 PM the last 2 evenings, but she has not received them.  The patient's daughter called her earlier in the morning on 12/21/2021.  Apparently the patient was confused.  CBG was ultimately checked and she was noted to have a sugar of 12.  The patient was given glucagon by nursing home staff.  Glucose improved to 36.  Upon EMS arrival, her sugar improved to 73.  Nevertheless, the patient was brought to the emergency department for further evaluation and treatment.  The patient denies any fevers, chills, chest pain, shortness breath, cough, hemoptysis, nausea, vomiting, direct abdominal pain, dysuria, hematuria.  She states that her appetite has been pretty good. In the ED, the patient was afebrile and hemodynamically stable with oxygen saturation 100% room air.  BMP showed sodium 134, potassium 4.3, bicarbonate 22, glucose 64, serum creatinine 4.19.  LFTs were unremarkable.  WBC 4.7, hemoglobin 10.8, platelets 234,000.  EKG showed atrial fibrillation with no ST-T wave changes.  The patient was not actually noted to be hypothermic with a temperature 92.7.  She was placed on a warming blanket. Prolonged hospitalization due to slow renal recovery.  Nephrology consulted and following     Assessment and Plan: * Acute renal failure superimposed on stage 4 chronic kidney disease, unspecified acute renal failure type ([place]) Secondary to volume  depletion and hemodynamic changes Presented with serum creatinine 4.19 Baseline creatinine 3.0-3.3 D/c IVF, pt on hypervolemic side Not much improvement of renal function, likely progression of CKD Renal consult appreciated Discussed with pt and daughter about HD>>noncommital but likely would agree when time arrives or if sense of urgency >>BUN/Creatinine appear to be consistently trending up over last few days -Discussed with patient and daughter and they are agreeable to proceed with dialysis -General surgery following, plan on dialysis catheter placement 9/5 -Will probably have first dialysis session same day.   A-fib ([place]) Type unspecified Rate controlled Holding apixaban for dialysis catheter placement Continue coreg  Essential hypertension Continue amlodipine, carvedilol, hydralazine  Hyperkalemia Continue twice daily Lokelma Repeat BMP in am  Hypothermia Presented with temperature 92.7 Check cortisol level--16.6 12/16/2021 TSH 6.574, free T4--1.22 Check PCT--0.50 Blood culture x2 sets--neg to date -improved  Chronic diastolic CHF (congestive heart failure) ([name]) Home lasix dose restarted 12/23/21 10/27/2021 echo EF 60 to 65%, no WMA, trivial MR  Hypoglycemia associated with type 2 diabetes mellitus ([place]) Suspect variable oral intake in the setting of long-acting Lantus and short acting insulin with worsen renal funciton  Received Humalog 6 units 3 times daily at SNF Holding Lantus NovoLog sliding scale--sensitive scale 10/26/2021 hemoglobin A1c 6.7 No further hypoglycemic episodes  Hyperlipidemia Continue statin  Anemia of chronic kidney disease -She has been receiving intravenous iron and ESA -Hemoglobin down to 7.1 today -Transfusing 1 unit of PRBC in anticipation of surgery/anesthesia tomorrow  Goals of care -palliative care following -patient now DNR    Family Communication:   daughter updated at bedside 9/2  Consultants:  nephrology, palliative  care, general surgery   Code Status:  DNR    DVT Prophylaxis: SCDs     Procedures: As Listed in Progress Note Above   Antibiotics: None     Subjective: She is eating lunch.  Denies any shortness of breath or abdominal pain.  Objective: Vitals:   01/01/22 0520 01/01/22 1305 01/01/22 2030 01/02/22 0456  BP: (!) 142/69 135/69 (!) 142/90 100/68  Pulse: 76 73 78 69  Resp:   20 20  Temp: 97.7 F (36.5 C) 97.6 F (36.4 C) 98.6 F (37 C) 98.7 F (37.1 C)  TempSrc: Oral Oral    SpO2: 98% 100% 100% 100%  Weight:      Height:        Intake/Output Summary (Last 24 hours) at 01/02/2022 1255 Last data filed at 01/02/2022 0500 Gross per 24 hour  Intake 514.98 ml  Output 600 ml  Net -85.02 ml    Weight change:  Exam:  General:  Pt is alert, follows commands appropriately, not in acute distress HEENT: No icterus, No thrush, No neck mass, [place]/AT Cardiovascular: IRRR, S1/S2, no rubs, no gallops Respiratory: bibasilar crackles. No wheeze Abdomen: Soft/+BS, non tender, non distended, no guarding Extremities: 1+LE  edema, No lymphangitis, No petechiae, No rashes, no synovitis   Data Reviewed: I have personally reviewed following labs and imaging studies Basic Metabolic Panel: Recent Labs  Lab 12/29/21 0451 12/30/21 0402 12/31/21 0500 01/01/22 0447 01/02/22 0450  NA 130* 130* 132* 132* 130*  K 5.4* 5.3* 5.6* 5.5* 5.0  CL 100 101 102 101 100  CO2 20* 19* '23 22 22  '$ GLUCOSE 172* 160* 145* 182* 177*  BUN 116* 118* 122* 124* 128*  CREATININE 4.61* 4.46* 4.48* 4.55* 4.56*  CALCIUM 8.0* 8.3* 8.5* 8.4* 8.1*  PHOS 8.0* 7.6* 7.9* 7.8* 7.6*   Liver Function Tests: Recent Labs  Lab 12/29/21 0451 12/30/21 0402 12/31/21 0500 01/01/22 0447 01/02/22 0450  ALBUMIN 2.3* 2.3* 2.3* 2.3* 2.1*   No results for input(s): "LIPASE", "AMYLASE" in the last 168 hours. No results for input(s): "AMMONIA" in the last 168 hours. Coagulation Profile: No results for input(s): "INR",  "PROTIME" in the last 168 hours. CBC: Recent Labs  Lab 12/28/21 0905 12/29/21 0451 12/30/21 0922 01/02/22 0858  WBC 5.9 5.8 5.9 7.4  HGB 8.2* 7.5* 8.5* 7.1*  HCT 25.9* 22.8* 26.1* 21.7*  MCV 94.2 89.8 90.6 92.3  PLT 226 266 340 285   Cardiac Enzymes: No results for input(s): "CKTOTAL", "CKMB", "CKMBINDEX", "TROPONINI" in the last 168 hours. BNP: Invalid input(s): "POCBNP" CBG: Recent Labs  Lab 01/01/22 1117 01/01/22 1622 01/01/22 2109 01/02/22 0747 01/02/22 1107  GLUCAP 200* 195* 215* 172* 223*   HbA1C: No results for input(s): "HGBA1C" in the last 72 hours. Urine analysis:    Component Value Date/Time   COLORURINE YELLOW 12/27/2021 0810   APPEARANCEUR CLEAR 12/27/2021 0810   LABSPEC 1.010 12/27/2021 0810   PHURINE 5.0 12/27/2021 0810   GLUCOSEU NEGATIVE 12/27/2021 0810   HGBUR NEGATIVE 12/27/2021 0810   BILIRUBINUR NEGATIVE 12/27/2021 0810   KETONESUR NEGATIVE 12/27/2021 0810   PROTEINUR 100 (A) 12/27/2021 0810   UROBILINOGEN 0.2 07/13/2012 1236   NITRITE NEGATIVE 12/27/2021 0810   LEUKOCYTESUR NEGATIVE 12/27/2021 0810   Sepsis Labs: '@LABRCNTIP'$ (procalcitonin:4,lacticidven:4) ) No results found for this or any previous visit (from the past 240 hour(s)).    Scheduled Meds:  sodium chloride   Intravenous Once   amLODipine  10 mg Oral Daily  atorvastatin  40 mg Oral QHS   carvedilol  3.125 mg Oral BID WC   Chlorhexidine Gluconate Cloth  6 each Topical Q0600   darbepoetin (ARANESP) injection - NON-DIALYSIS  200 mcg Subcutaneous Q Thu-1800   feeding supplement  237 mL Oral BID BM   hydrALAZINE  100 mg Oral BID   insulin aspart  0-5 Units Subcutaneous QHS   insulin aspart  0-9 Units Subcutaneous TID WC   polyethylene glycol  17 g Oral Daily   polyvinyl alcohol  1 drop Both Eyes TID   senna  2 tablet Oral Daily   sodium zirconium cyclosilicate  10 g Oral BID   Continuous Infusions:    Procedures/Studies: US RENAL  Result Date: 12/27/2021 CLINICAL  DATA:  Acute kidney insufficiency on admission. EXAM: RENAL / URINARY TRACT ULTRASOUND COMPLETE COMPARISON:  Renal ultrasound 04/05/2020, CT abdomen and pelvis 02/21/2021 FINDINGS: Right Kidney: Renal measurements: 9.7 x 4.7 x 5.4 cm = volume: 129 mL. Increased cortical echogenicity as can be seen with medical renal disease, similar to prior. Lateral right midpole hypoechoic cyst measuring up to 1.1 cm, benign. No suspicious right renal mass is seen. Left Kidney: Limited evaluation of the left kidney due to overlying bowel gas. Renal measurements: Maximally 9.0 x 4.5 x 3.2 cm = volume: Approximately 67 mL. There again appears to be increased echogenicity of the left renal cortex as can be seen with medical renal disease. No gross cyst or solid mass is seen. Bladder: Only mildly distended, limiting evaluation. Other: None. IMPRESSION: 1. Evaluation of the left kidney is limited by overlying bowel gas. No hydronephrosis is seen either side. 2. There is again increased cortical echogenicity as can be seen with medical renal disease. Electronically Signed   By: Yvonne Kendall M.D.   On: 12/27/2021 10:47   DG Chest Portable 1 View  Result Date: 12/16/2021 CLINICAL DATA:  Difficult the rising to standing position. EXAM: PORTABLE CHEST 1 VIEW COMPARISON:  11/10/2021 FINDINGS: Lungs are hypoinflated and otherwise clear. Mild stable cardiomegaly. Remainder of the exam is unchanged. IMPRESSION: Hypoinflation without acute cardiopulmonary disease. Electronically Signed   By: Marin Olp M.D.   On: 12/16/2021 13:15    Kathie Dike, MD  [facility]  If 7PM-7AM, please contact night-coverage [email or web address] 01/02/2022, 12:55 PM   LOS: 12 days

## 2022-01-02 NOTE — Progress Notes (Signed)
Daily Progress Note   Patient Name: Chelsea Meza       Date: 01/02/2022 DOB: 09-03-1935  Age: 86 y.o. MRN#: [id number] Attending Physician: Kathie Dike, MD Primary Care Physician: Hal Morales, DO Admit Date: 12/21/2021  Reason for Consultation/Follow-up: Establishing goals of care  Patient Profile/HPI:  86 y.o. female  with past medical history of Hypertension hyperlipidemia, diabetes, CKD stage IV, CHF, A-fib, admitted on 12/21/2021 with hypoglycemia, hypothermia, and acute renal failure superimposed on stage IV chronic kidney disease.  Nephrology consulted and has been discussing potential dialysis with her and her daughter.  Palliative medicine consulted for goals of care. Subjective: Chart reviewed including labs, progress notes. Note patient has decided to proceed with hemodialysis.  She is awake and alert today. She is at peace with the decision to start dialysis. She also understands that if dialysis doesn't meet her goal of improving her function and quality of life then it can be stopped.  Eyes are feeling better with drops.    Review of Systems  Constitutional:  Positive for malaise/fatigue.     Physical Exam Vitals and nursing note reviewed.  Constitutional:      General: She is not in acute distress. Pulmonary:     Effort: Pulmonary effort is normal.  Neurological:     Mental Status: She is alert and oriented to person, place, and time.             Vital Signs: BP 102/71   Pulse 80   Temp 97.9 F (36.6 C) (Oral)   Resp 19   Ht '5\' 10"'$  (1.778 m)   Wt 74.5 kg   SpO2 100%   BMI 23.56 kg/m  SpO2: SpO2: 100 % O2 Device: O2 Device: Room Air O2 Flow Rate:    Intake/output summary:  Intake/Output Summary (Last 24 hours) at 01/02/2022 1401 Last data  filed at 01/02/2022 1015 Gross per 24 hour  Intake 514.98 ml  Output 600 ml  Net -85.02 ml   LBM: Last BM Date : 01/02/22 Baseline Weight: Weight: 74.5 kg Most recent weight: Weight: 74.5 kg       Palliative Assessment/Data: PPS: 40%      Patient Active Problem List   Diagnosis Date Noted   Hyperkalemia 12/24/2021   Acute renal failure superimposed on stage 4 chronic kidney disease, unspecified  acute renal failure type ([place]) 12/21/2021   Chronic diastolic CHF (congestive heart failure) ([place]) 12/21/2021   Hypothermia 12/21/2021   Thyroid dysfunction 12/17/2021   Debility 12/16/2021   A-fib ([place]) 12/16/2021   Lower extremity edema 12/16/2021   Pressure injury of skin 02/24/2021   Seizures ([place]) 11/94/1740   Acute metabolic encephalopathy 81/44/8185   Acute lower UTI 02/23/2021   Malnutrition of moderate degree 06/23/2020   Poorly controlled diabetes mellitus ([place]) 06/21/2020   Hypoglycemia associated with type 2 diabetes mellitus ([place]) 06/21/2020   Hypertensive urgency 04/05/2020   Cardiac arrest ([place]) 04/05/2020   Acute congestive heart failure ([place]) 10/18/2018   Hyperosmolar non-ketotic state in patient with type 2 diabetes mellitus ([place]) 10/18/2018   Transient cerebral ischemia 11/05/2017   Hyperglycemia due to diabetes mellitus ([place]) 03/20/2015   Hyperlipidemia 03/20/2015   Diabetic hyperosmolar non-ketotic state ([place]) 07/14/2012   DKA, type 2 ([place]) 07/13/2012   Acute kidney injury superimposed on CKD ([place]) 07/13/2012   Paroxysmal supraventricular tachycardia ([place]) 07/13/2012   Paraparesis ([place]) 07/13/2012   Paresthesia of left leg 07/13/2012   Essential hypertension 07/13/2012   Paresthesia of skin 07/13/2012    Palliative Care Assessment & Plan    Assessment/Recommendations/Plan  Continue current plan of care Continue eye drops for dry eyes DNR Outpatient palliative followup recommended   Code Status: DNR  Prognosis:  Unable to  determine  Discharge Planning: [place] for rehab with Palliative care service follow-up  Care plan was discussed with patient and care team  Thank you for allowing the Palliative Medicine Team to assist in the care of this patient.  Greater than 50%  of this time was spent counseling and coordinating care related to the above assessment and plan.  Mariana Kaufman, AGNP-C Palliative Medicine   Please contact Palliative Medicine Team phone at [phone] for questions and concerns.

## 2022-01-02 NOTE — Progress Notes (Signed)
Patient ID: [name], female   DOB: 1936/04/04, 86 y.o.   MRN: [id number]  Assessment/Plan:   AKI/CKD stage IV - Longstanding CKD presumably due to DM and HTN.  Was followed by Dr. Theador Hawthorne but not seen since September 2021.  Scr was 2.63 and 24 hour creatinine clearance 18 mL/min on 12/12/19.  Scr was 2.4-2.65 in 2022 but has been ranging 3.1-3.5 prior to admission when was 4.2.  Marked diuresis of 3.2 liters since admit but led to inc in crt  Lasix on hold since 12/27/21.   no urgent indication for dialysis at this time but has had decline over the last 6 weeks-  and not being able to fully participate in rehab possibly due to uremia and volume overload.  She would be a marginal candidate given her poor nutritional and functional status but again feel that CKD could be responsible for both.  Different modalities of RRT have already been d/w with Mrs. Held and her daughter.  Her daughter has decided she wants to proceed-  wants PD but can only do HD while at [facility]. Eliquis on hold. - Appreciate Dr. Arnoldo Morale seeing the patient and setting her up for a TC on Tues with Dr. Constance Haw. Will dialyze afterwards for a short 1st treatment and then 2nd on Wed. - CLIP as well Hyponatremia - felt to be hypervolemic secondary to ESRD + dCHF Hyperkalemia - Due to #1.  Continue with lokelma  bid.  Lasix on hold for now as above. MGUS - SPEP/UPEP pending (has not been checked in over 2 years). Anemia of CKD stage IV - Hgb down to 8.6.  iron stores low-  will give iron and ESA-  and transfuse for Hgb <7.  Likely contributing to her FTT as well  Atrial fibrillation - rate controlled, per primary Chronic diastolic CHF - ECHO on 0/10/93 concerning for cardiac amyloid.  Has never seen a Cardiologist. Still overloaded DM type 2 - presented with hypoglycemia.  Plan per primary svc Disposition - came from SNF and not strong enough to go home.  She is a marginal dialysis candidate as above.  But was doing well 2 mos ago-   Volume overload and anemia are likely contributing to her FTT and wont significantly improve UNLESS we start HD.but one never knows how it is going to go  Poor performance status and advanced age.  S: Feels well; denies fever, chills, SOB, nausea. States she has an appetite. No events overnight. UOP 900. They do want to give dialysis a try -  kidney numbers pretty stable    O:BP 100/68 (BP Location: Right Arm)   Pulse 69   Temp 98.7 F (37.1 C)   Resp 20   Ht '5\' 10"'$  (1.778 m)   Wt 74.5 kg   SpO2 100%   BMI 23.56 kg/m   Intake/Output Summary (Last 24 hours) at 01/02/2022 1148 Last data filed at 01/02/2022 0500 Gross per 24 hour  Intake 514.98 ml  Output 600 ml  Net -85.02 ml   Intake/Output: I/O last 3 completed shifts: In: 1254.7 [P.O.:720; IV Piggyback:534.7] Out: 1750 [Urine:1750]  Intake/Output this shift:  No intake/output data recorded. Weight change:  Gen: frail, chronically ill-appearing, NAD, pleasant CVS:RRR Resp: rales at bases Abd: +BS, soft, NT/ND Ext: pitting edema  Recent Labs  Lab 12/27/21 0424 12/28/21 0346 12/29/21 0451 12/30/21 0402 12/31/21 0500 01/01/22 0447 01/02/22 0450  NA 132* 131* 130* 130* 132* 132* 130*  K 5.5* 5.6* 5.4* 5.3* 5.6* 5.5*  5.0  CL 104 103 100 101 102 101 100  CO2 21* 21* 20* 19* '23 22 22  '$ GLUCOSE 126* 151* 172* 160* 145* 182* 177*  BUN 110* 108* 116* 118* 122* 124* 128*  CREATININE 4.28* 4.36* 4.61* 4.46* 4.48* 4.55* 4.56*  ALBUMIN 2.3* 2.1* 2.3* 2.3* 2.3* 2.3* 2.1*  CALCIUM 8.3* 8.1* 8.0* 8.3* 8.5* 8.4* 8.1*  PHOS 7.2* 7.5* 8.0* 7.6* 7.9* 7.8* 7.6*   Liver Function Tests: Recent Labs  Lab 12/31/21 0500 01/01/22 0447 01/02/22 0450  ALBUMIN 2.3* 2.3* 2.1*   No results for input(s): "LIPASE", "AMYLASE" in the last 168 hours. No results for input(s): "AMMONIA" in the last 168 hours. CBC: Recent Labs  Lab 12/28/21 0905 12/29/21 0451 12/30/21 0922 01/02/22 0858  WBC 5.9 5.8 5.9 7.4  HGB 8.2* 7.5* 8.5* 7.1*  HCT  25.9* 22.8* 26.1* 21.7*  MCV 94.2 89.8 90.6 92.3  PLT 226 266 340 285   Cardiac Enzymes: No results for input(s): "CKTOTAL", "CKMB", "CKMBINDEX", "TROPONINI" in the last 168 hours. CBG: Recent Labs  Lab 01/01/22 1117 01/01/22 1622 01/01/22 2109 01/02/22 0747 01/02/22 1107  GLUCAP 200* 195* 215* 172* 223*    Iron Studies:  No results for input(s): "IRON", "TIBC", "TRANSFERRIN", "FERRITIN" in the last 72 hours.  Studies/Results: No results found.   sodium chloride   Intravenous Once   amLODipine  10 mg Oral Daily   atorvastatin  40 mg Oral QHS   carvedilol  3.125 mg Oral BID WC   Chlorhexidine Gluconate Cloth  6 each Topical Q0600   darbepoetin (ARANESP) injection - NON-DIALYSIS  200 mcg Subcutaneous Q Thu-1800   feeding supplement  237 mL Oral BID BM   hydrALAZINE  100 mg Oral BID   insulin aspart  0-5 Units Subcutaneous QHS   insulin aspart  0-9 Units Subcutaneous TID WC   polyethylene glycol  17 g Oral Daily   polyvinyl alcohol  1 drop Both Eyes TID   senna  2 tablet Oral Daily   sodium zirconium cyclosilicate  10 g Oral BID    BMET    Component Value Date/Time   NA 130 (L) 01/02/2022 0450   K 5.0 01/02/2022 0450   CL 100 01/02/2022 0450   CO2 22 01/02/2022 0450   GLUCOSE 177 (H) 01/02/2022 0450   BUN 128 (H) 01/02/2022 0450   CREATININE 4.56 (H) 01/02/2022 0450   CALCIUM 8.1 (L) 01/02/2022 0450   GFRNONAA 9 (L) 01/02/2022 0450   GFRAA 28 (L) 10/22/2018 0526   CBC    Component Value Date/Time   WBC 7.4 01/02/2022 0858   RBC 2.35 (L) 01/02/2022 0858   HGB 7.1 (L) 01/02/2022 0858   HCT 21.7 (L) 01/02/2022 0858   PLT 285 01/02/2022 0858   MCV 92.3 01/02/2022 0858   MCH 30.2 01/02/2022 0858   MCHC 32.7 01/02/2022 0858   RDW 13.9 01/02/2022 0858   LYMPHSABS 1.2 12/21/2021 1207   MONOABS 0.2 12/21/2021 1207   EOSABS 0.0 12/21/2021 1207   BASOSABS 0.0 12/21/2021 1207      Edwen Mclester, Jeneen Rinks W  [employer]

## 2022-01-02 NOTE — Progress Notes (Signed)
Physical Therapy Treatment Patient Details Name: Chelsea Meza MRN: [id number] DOB: 04-05-1936 Today's Date: 01/02/2022   History of Present Illness Chelsea Meza is a 86 year old female with a history of hypertension, hyperlipidemia, diabetes mellitus type 2, CKD stage IV, CHF, atrial fibrillation, hyperlipidemia presenting from [facility] secondary to altered mental status.  The patient was recently hospitalized from 12/16/2021 to 12/17/2021 secondary to acute on chronic renal failure.  She was discharged to [facility] at that time.  She states that she has been asking for bedtime snack around 8 PM the last 2 evenings, but she has not received them.  The patient's daughter called her earlier in the morning on 12/21/2021.  Apparently the patient was confused.  CBG was ultimately checked and she was noted to have a sugar of 12.  The patient was given glucagon by nursing home staff.  Glucose improved to 36.  Upon EMS arrival, her sugar improved to 73.  Nevertheless, the patient was brought to the emergency department for further evaluation and treatment.  The patient denies any fevers, chills, chest pain, shortness breath, cough, hemoptysis, nausea, vomiting, direct abdominal pain, dysuria, hematuria.  She states that her appetite has been pretty good.  In the ED, the patient was afebrile and hemodynamically stable with oxygen saturation 100% room air.  BMP showed sodium 134, potassium 4.3, bicarbonate 22, glucose 64, serum creatinine 4.19.  LFTs were unremarkable.  WBC 4.7, hemoglobin 10.8, platelets 234,000.  EKG showed atrial fibrillation with no ST-T wave changes.  The patient was not actually noted to be hypothermic with a temperature 92.7.  She was placed on a warming blanket.    PT Comments    Pt supine in bed and willing to participate with therapy today.  Pt with need for bowel movement, SPT to [facility].  Cueing to assist with bed mobility for arm reaching to handrail on bed to improve trunk  rotation, able to slowly move LE to EOB.  Mod A with standing transfer then able to ambulate with RW to chair with slow, labored movements, no LOB episodes.  EOS pt left in chair with call bell within reach and chair alarm set.  No reports of pain, was limited by fatigue due to weakness.      Recommendations for follow up therapy are one component of a multi-disciplinary discharge planning process, led by the attending physician.  Recommendations may be updated based on patient status, additional functional criteria and insurance authorization.  Follow Up Recommendations  Skilled nursing-short term rehab (<3 hours/day)     Assistance Recommended at Discharge    Patient can return home with the following     Equipment Recommendations       Recommendations for Other Services       Precautions / Restrictions Precautions Precautions: Fall Restrictions Weight Bearing Restrictions: No     Mobility  Bed Mobility Overal bed mobility: Needs Assistance Bed Mobility: Supine to Sit       Sit to supine: Supervision   General bed mobility comments: slow labored movements, cueing for hand placement to assist with rollling and sidelying to sit    Transfers Overall transfer level: Needs assistance Equipment used: Rolling walker (2 wheels) Transfers: Sit to/from Stand Sit to Stand: Mod assist   Step pivot transfers: Mod assist (SPT to [facility])       General transfer comment: Cueing for hand and foot placement, mod A to stand    Ambulation/Gait Ambulation/Gait assistance: Min assist Gait Distance (Feet): 18  Feet Assistive device: Rolling walker (2 wheels) Gait Pattern/deviations: Decreased step length - right, Decreased step length - left, Decreased stride length Gait velocity: decreased     General Gait Details: labored cadence to walk to chair on other side of room with RW   Stairs             Wheelchair Mobility    Modified Rankin (Stroke Patients Only)        Balance                                            Cognition Arousal/Alertness: Awake/alert Behavior During Therapy: WFL for tasks assessed/performed Overall Cognitive Status: Within Functional Limits for tasks assessed                                          Exercises      General Comments        Pertinent Vitals/Pain Pain Assessment Pain Assessment: No/denies pain    Home Living                          Prior Function            PT Goals (current goals can now be found in the care plan section)      Frequency    Min 3X/week      PT Plan Current plan remains appropriate    Co-evaluation              AM-PAC PT "6 Clicks" Mobility   Outcome Measure  Help needed turning from your back to your side while in a flat bed without using bedrails?: None Help needed moving from lying on your back to sitting on the side of a flat bed without using bedrails?: A Little Help needed moving to and from a bed to a chair (including a wheelchair)?: A Little Help needed standing up from a chair using your arms (e.g., wheelchair or bedside chair)?: A Lot Help needed to walk in hospital room?: A Lot Help needed climbing 3-5 steps with a railing? : A Lot 6 Click Score: 16    End of Session Equipment Utilized During Treatment: Gait belt   Patient left: in chair;with call bell/phone within reach;with chair alarm set Nurse Communication: Mobility status PT Visit Diagnosis: Unsteadiness on feet (R26.81);Other abnormalities of gait and mobility (R26.89);Muscle weakness (generalized) (M62.81)     Time: 8250-5397 PT Time Calculation (min) (ACUTE ONLY): 35 min  Charges:  $Therapeutic Activity: 23-37 mins                     Ihor Austin, LPTA/CLT; CBIS [phone]  Aldona Lento 01/02/2022, 10:53 AM

## 2022-01-03 ENCOUNTER — Inpatient Hospital Stay (HOSPITAL_COMMUNITY): Payer: Medicare Other

## 2022-01-03 ENCOUNTER — Encounter (HOSPITAL_COMMUNITY): Payer: Self-pay | Admitting: Internal Medicine

## 2022-01-03 ENCOUNTER — Inpatient Hospital Stay (HOSPITAL_COMMUNITY): Payer: Medicare Other | Admitting: Anesthesiology

## 2022-01-03 ENCOUNTER — Other Ambulatory Visit: Payer: Self-pay

## 2022-01-03 ENCOUNTER — Encounter (HOSPITAL_COMMUNITY)
Admission: EM | Disposition: A | Discharge status: Skilled Nursing Facility | Payer: Self-pay | Source: Skilled Nursing Facility | Attending: Internal Medicine

## 2022-01-03 DIAGNOSIS — N186 End stage renal disease: Secondary | ICD-10-CM | POA: Diagnosis not present

## 2022-01-03 DIAGNOSIS — I10 Essential (primary) hypertension: Secondary | ICD-10-CM | POA: Diagnosis not present

## 2022-01-03 DIAGNOSIS — E1122 Type 2 diabetes mellitus with diabetic chronic kidney disease: Secondary | ICD-10-CM

## 2022-01-03 DIAGNOSIS — N179 Acute kidney failure, unspecified: Secondary | ICD-10-CM | POA: Diagnosis not present

## 2022-01-03 DIAGNOSIS — I509 Heart failure, unspecified: Secondary | ICD-10-CM | POA: Diagnosis not present

## 2022-01-03 DIAGNOSIS — E875 Hyperkalemia: Secondary | ICD-10-CM | POA: Diagnosis not present

## 2022-01-03 DIAGNOSIS — I4891 Unspecified atrial fibrillation: Secondary | ICD-10-CM | POA: Diagnosis not present

## 2022-01-03 DIAGNOSIS — I132 Hypertensive heart and chronic kidney disease with heart failure and with stage 5 chronic kidney disease, or end stage renal disease: Secondary | ICD-10-CM | POA: Diagnosis not present

## 2022-01-03 DIAGNOSIS — Z794 Long term (current) use of insulin: Secondary | ICD-10-CM

## 2022-01-03 DIAGNOSIS — Z992 Dependence on renal dialysis: Secondary | ICD-10-CM

## 2022-01-03 HISTORY — PX: INSERTION OF DIALYSIS CATHETER: SHX1324

## 2022-01-03 LAB — GLUCOSE, CAPILLARY
Glucose-Capillary: 145 mg/dL — ABNORMAL HIGH (ref 70–99)
Glucose-Capillary: 133 mg/dL — ABNORMAL HIGH (ref 70–99)
Glucose-Capillary: 139 mg/dL — ABNORMAL HIGH (ref 70–99)

## 2022-01-03 LAB — MULTIPLE MYELOMA PANEL, SERUM
Albumin SerPl Elph-Mcnc: 2.5 g/dL — ABNORMAL LOW (ref 2.9–4.4)
Albumin/Glob SerPl: 1.2 (ref 0.7–1.7)
Alpha 1: 0.1 g/dL (ref 0.0–0.4)
Alpha2 Glob SerPl Elph-Mcnc: 0.5 g/dL (ref 0.4–1.0)
B-Globulin SerPl Elph-Mcnc: 0.7 g/dL (ref 0.7–1.3)
Gamma Glob SerPl Elph-Mcnc: 0.9 g/dL (ref 0.4–1.8)
Globulin, Total: 2.2 g/dL (ref 2.2–3.9)
IgA: 104 mg/dL (ref 64–422)
IgG (Immunoglobin G), Serum: 993 mg/dL (ref 586–1602)
IgM (Immunoglobulin M), Srm: 106 mg/dL (ref 26–217)
M Protein SerPl Elph-Mcnc: 0.4 g/dL — ABNORMAL HIGH
Total Protein ELP: 4.7 g/dL — ABNORMAL LOW (ref 6.0–8.5)

## 2022-01-03 LAB — CBC
HCT: 24.9 % — ABNORMAL LOW (ref 36.0–46.0)
Hemoglobin: 8.4 g/dL — ABNORMAL LOW (ref 12.0–15.0)
MCH: 30.2 pg (ref 26.0–34.0)
MCHC: 33.7 g/dL (ref 30.0–36.0)
MCV: 89.6 fL (ref 80.0–100.0)
Platelets: 279 10*3/uL (ref 150–400)
RBC: 2.78 MIL/uL — ABNORMAL LOW (ref 3.87–5.11)
RDW: 13.5 % (ref 11.5–15.5)
WBC: 8.6 10*3/uL (ref 4.0–10.5)
nRBC: 0 % (ref 0.0–0.2)

## 2022-01-03 LAB — RENAL FUNCTION PANEL
Albumin: 2.2 g/dL — ABNORMAL LOW (ref 3.5–5.0)
Anion gap: 12 (ref 5–15)
BUN: 130 mg/dL — ABNORMAL HIGH (ref 8–23)
CO2: 20 mmol/L — ABNORMAL LOW (ref 22–32)
Calcium: 8.3 mg/dL — ABNORMAL LOW (ref 8.9–10.3)
Chloride: 99 mmol/L (ref 98–111)
Creatinine, Ser: 4.88 mg/dL — ABNORMAL HIGH (ref 0.44–1.00)
GFR, Estimated: 8 mL/min — ABNORMAL LOW (ref >60)
Glucose, Bld: 154 mg/dL — ABNORMAL HIGH (ref 70–99)
Phosphorus: 7.7 mg/dL — ABNORMAL HIGH (ref 2.5–4.6)
Potassium: 4.7 mmol/L (ref 3.5–5.1)
Sodium: 131 mmol/L — ABNORMAL LOW (ref 135–145)

## 2022-01-03 LAB — HEPATITIS B SURFACE ANTIBODY, QUANTITATIVE: Hep B S AB Quant (Post): <3.1 m[IU]/mL — ABNORMAL LOW (ref >9.9)

## 2022-01-03 SURGERY — INSERTION OF DIALYSIS CATHETER
Anesthesia: General | Site: Chest | Laterality: Right

## 2022-01-03 MED ORDER — OXYCODONE HCL 5 MG PO TABS
5.0000 mg | ORAL_TABLET | Freq: Four times a day (QID) | ORAL | Status: DC | PRN
  Administered 2022-01-03: 5 mg via ORAL
  Filled 2022-01-03 (×2): qty 1

## 2022-01-03 MED ORDER — PROPOFOL 10 MG/ML IV BOLUS
INTRAVENOUS | Status: DC | PRN
  Administered 2022-01-03 (×2): 10 mg via INTRAVENOUS
  Administered 2022-01-03: 20 mg via INTRAVENOUS
  Administered 2022-01-03 (×5): 10 mg via INTRAVENOUS
  Administered 2022-01-03: 20 mg via INTRAVENOUS
  Administered 2022-01-03: 10 mg via INTRAVENOUS

## 2022-01-03 MED ORDER — LIDOCAINE HCL (PF) 1 % IJ SOLN
INTRAMUSCULAR | Status: DC | PRN
  Administered 2022-01-03: 7 mL

## 2022-01-03 MED ORDER — HEPARIN SODIUM (PORCINE) 1000 UNIT/ML DIALYSIS
1000.0000 [IU] | INTRAMUSCULAR | Status: DC | PRN
  Administered 2022-01-06: 3000 [IU]

## 2022-01-03 MED ORDER — CHLORHEXIDINE GLUCONATE CLOTH 2 % EX PADS
6.0000 | MEDICATED_PAD | Freq: Once | CUTANEOUS | Status: DC
  Administered 2022-01-03: 6 via TOPICAL

## 2022-01-03 MED ORDER — HEPARIN SODIUM (PORCINE) 1000 UNIT/ML IJ SOLN
INTRAMUSCULAR | Status: AC
  Filled 2022-01-03: qty 2

## 2022-01-03 MED ORDER — SODIUM CHLORIDE 0.9 % IV SOLN: INTRAVENOUS | Status: DC

## 2022-01-03 MED ORDER — ALTEPLASE 2 MG IJ SOLR: 2.0000 mg | Freq: Once | INTRAMUSCULAR | Status: DC | PRN

## 2022-01-03 MED ORDER — ORAL CARE MOUTH RINSE: 15.0000 mL | Freq: Once | OROMUCOSAL | Status: AC

## 2022-01-03 MED ORDER — CHLORHEXIDINE GLUCONATE CLOTH 2 % EX PADS
6.0000 | MEDICATED_PAD | Freq: Every day | CUTANEOUS | Status: DC
  Administered 2022-01-03 – 2022-01-08 (×6): 6 via TOPICAL

## 2022-01-03 MED ORDER — HEPARIN SODIUM (PORCINE) 1000 UNIT/ML IJ SOLN
INTRAMUSCULAR | Status: AC
  Administered 2022-01-03: 3200 [IU]
  Filled 2022-01-03: qty 4

## 2022-01-03 MED ORDER — LIDOCAINE HCL (PF) 2 % IJ SOLN
INTRAMUSCULAR | Status: AC
  Filled 2022-01-03: qty 5

## 2022-01-03 MED ORDER — PROPOFOL 10 MG/ML IV BOLUS
INTRAVENOUS | Status: AC
  Filled 2022-01-03: qty 20

## 2022-01-03 MED ORDER — FENTANYL CITRATE PF 50 MCG/ML IJ SOSY: 25.0000 ug | PREFILLED_SYRINGE | INTRAMUSCULAR | Status: DC | PRN

## 2022-01-03 MED ORDER — CEFAZOLIN SODIUM-DEXTROSE 2-4 GM/100ML-% IV SOLN
INTRAVENOUS | Status: AC
  Filled 2022-01-03: qty 100

## 2022-01-03 MED ORDER — LIDOCAINE HCL (CARDIAC) PF 100 MG/5ML IV SOSY
PREFILLED_SYRINGE | INTRAVENOUS | Status: DC | PRN
  Administered 2022-01-03: 40 mg via INTRAVENOUS

## 2022-01-03 MED ORDER — HEPARIN SODIUM (PORCINE) 1000 UNIT/ML IJ SOLN
INTRAMUSCULAR | Status: AC
  Filled 2022-01-03: qty 4

## 2022-01-03 MED ORDER — LIDOCAINE HCL (PF) 1 % IJ SOLN
INTRAMUSCULAR | Status: AC
  Filled 2022-01-03: qty 30

## 2022-01-03 MED ORDER — HEPARIN SODIUM (PORCINE) 1000 UNIT/ML IJ SOLN
INTRAMUSCULAR | Status: DC | PRN
  Administered 2022-01-03: 3200 [IU] via INTRAVENOUS

## 2022-01-03 MED ORDER — SODIUM CHLORIDE (PF) 0.9 % IJ SOLN
INTRAMUSCULAR | Status: DC | PRN
  Administered 2022-01-03: 500 mL via INTRAVENOUS

## 2022-01-03 MED ORDER — CEFAZOLIN SODIUM-DEXTROSE 2-4 GM/100ML-% IV SOLN
2.0000 g | INTRAVENOUS | Status: AC
  Administered 2022-01-03: 2 g via INTRAVENOUS

## 2022-01-03 MED ORDER — CHLORHEXIDINE GLUCONATE 0.12 % MT SOLN
15.0000 mL | Freq: Once | OROMUCOSAL | Status: AC
  Administered 2022-01-03: 15 mL via OROMUCOSAL

## 2022-01-03 SURGICAL SUPPLY — 44 items
ADH SKN CLS APL DERMABOND .7 (GAUZE/BANDAGES/DRESSINGS) ×1
APL PRP STRL LF ISPRP CHG 10.5 (MISCELLANEOUS) ×1
APPLICATOR CHLORAPREP 10.5 ORG (MISCELLANEOUS) ×2 IMPLANT
BAG DECANTER FOR FLEXI CONT (MISCELLANEOUS) ×2 IMPLANT
BIOPATCH RED 1 DISK 7.0 (GAUZE/BANDAGES/DRESSINGS) ×2 IMPLANT
CATH PALINDROME-P 19CM W/VT (CATHETERS) IMPLANT
CATH PALINDROME-P 23CM W/VT (CATHETERS) IMPLANT
COVER LIGHT HANDLE STERIS (MISCELLANEOUS) ×4 IMPLANT
COVER PROBE U/S 5X48 (MISCELLANEOUS) ×2 IMPLANT
DECANTER SPIKE VIAL GLASS SM (MISCELLANEOUS) ×4 IMPLANT
DERMABOND ADVANCED .7 DNX12 (GAUZE/BANDAGES/DRESSINGS) ×2 IMPLANT
DRAPE C-ARM FOLDED MOBILE STRL (DRAPES) ×2 IMPLANT
DRAPE CHEST BREAST 15X10 FENES (DRAPES) ×2 IMPLANT
DRSG SORBAVIEW 3.5X5-5/16 MED (GAUZE/BANDAGES/DRESSINGS) ×2 IMPLANT
ELECT REM PT RETURN 9FT ADLT (ELECTROSURGICAL) ×1
ELECTRODE REM PT RTRN 9FT ADLT (ELECTROSURGICAL) ×2 IMPLANT
GAUZE 4X4 16PLY ~~LOC~~+RFID DBL (SPONGE) ×2 IMPLANT
GEL ULTRASOUND 20GR AQUASONIC (MISCELLANEOUS) ×2 IMPLANT
GLOVE BIO SURGEON STRL SZ 6.5 (GLOVE) ×2 IMPLANT
GLOVE BIOGEL PI IND STRL 6.5 (GLOVE) ×2 IMPLANT
GLOVE BIOGEL PI IND STRL 7.0 (GLOVE) ×4 IMPLANT
GOWN STRL REUS W/TWL LRG LVL3 (GOWN DISPOSABLE) ×4 IMPLANT
IV CONNECTOR ONE LINK NDLESS (IV SETS) IMPLANT
IV NS 500ML (IV SOLUTION) ×1
IV NS 500ML BAXH (IV SOLUTION) ×2 IMPLANT
KIT BLADEGUARD II DBL (SET/KITS/TRAYS/PACK) ×2 IMPLANT
KIT TURNOVER KIT A (KITS) ×2 IMPLANT
MARKER SKIN DUAL TIP RULER LAB (MISCELLANEOUS) ×2 IMPLANT
NDL HYPO 18GX1.5 BLUNT FILL (NEEDLE) ×2 IMPLANT
NDL HYPO 25X1 1.5 SAFETY (NEEDLE) ×2 IMPLANT
NEEDLE HYPO 18GX1.5 BLUNT FILL (NEEDLE) ×1 IMPLANT
NEEDLE HYPO 25X1 1.5 SAFETY (NEEDLE) ×1 IMPLANT
PACK BASIC III (CUSTOM PROCEDURE TRAY) ×1
PACK SRG BSC III STRL LF ECLPS (CUSTOM PROCEDURE TRAY) ×2 IMPLANT
PAD ARMBOARD 7.5X6 YLW CONV (MISCELLANEOUS) ×2 IMPLANT
PENCIL SMOKE EVACUATOR COATED (MISCELLANEOUS) ×2 IMPLANT
SET BASIN LINEN APH (SET/KITS/TRAYS/PACK) ×2 IMPLANT
SUT MNCRL AB 4-0 PS2 18 (SUTURE) ×2 IMPLANT
SUT SILK 2 0 FSL 18 (SUTURE) ×2 IMPLANT
SUT VIC AB 3-0 SH 27 (SUTURE) ×1
SUT VIC AB 3-0 SH 27X BRD (SUTURE) ×2 IMPLANT
SYR 10ML LL (SYRINGE) ×4 IMPLANT
SYR CONTROL 10ML LL (SYRINGE) ×2 IMPLANT
WIRE GUIDE BENTSON .035 15CM (WIRE) IMPLANT

## 2022-01-03 NOTE — Progress Notes (Signed)
[facility]  Cxr with catheter in the position, and no more pneumothorax.  Curlene Labrum MD

## 2022-01-03 NOTE — Patient Instructions (Signed)

## 2022-01-03 NOTE — Progress Notes (Signed)
Occupational Therapy Treatment Patient Details Name: Chelsea Meza MRN: [id number] DOB: 1936-03-30 Today's Date: 01/03/2022   History of present illness Chelsea Meza is a 86 year old female with a history of hypertension, hyperlipidemia, diabetes mellitus type 2, CKD stage IV, CHF, atrial fibrillation, hyperlipidemia presenting from [facility] secondary to altered mental status.  The patient was recently hospitalized from 12/16/2021 to 12/17/2021 secondary to acute on chronic renal failure.  She was discharged to [facility] at that time.  She states that she has been asking for bedtime snack around 8 PM the last 2 evenings, but she has not received them.  The patient's daughter called her earlier in the morning on 12/21/2021.  Apparently the patient was confused.  CBG was ultimately checked and she was noted to have a sugar of 12.  The patient was given glucagon by nursing home staff.  Glucose improved to 36.  Upon EMS arrival, her sugar improved to 73.  Nevertheless, the patient was brought to the emergency department for further evaluation and treatment.  The patient denies any fevers, chills, chest pain, shortness breath, cough, hemoptysis, nausea, vomiting, direct abdominal pain, dysuria, hematuria.  She states that her appetite has been pretty good.  In the ED, the patient was afebrile and hemodynamically stable with oxygen saturation 100% room air.  BMP showed sodium 134, potassium 4.3, bicarbonate 22, glucose 64, serum creatinine 4.19.  LFTs were unremarkable.  WBC 4.7, hemoglobin 10.8, platelets 234,000.  EKG showed atrial fibrillation with no ST-T wave changes.  The patient was not actually noted to be hypothermic with a temperature 92.7.  She was placed on a warming blanket.   OT comments  Pt agreeable to OT evaluation but presenting more fatigued then previous session. Pt reported desire not to transfer to chair today due to feeling weak. Pt was able to sit at EOB but with much  extended time and labored movement. To return to supine pt required moderate assistance. When supine pt completed B UE A/ROM strengthening with handout provided. Pt was left in bed with call bell within reach and daughter present. Pt will benefit from continued OT in the hospital and recommended venue below to increase strength, balance, and endurance for safe ADL's.      Recommendations for follow up therapy are one component of a multi-disciplinary discharge planning process, led by the attending physician.  Recommendations may be updated based on patient status, additional functional criteria and insurance authorization.    Follow Up Recommendations  Skilled nursing-short term rehab (<3 hours/day)    Assistance Recommended at Discharge Intermittent Supervision/Assistance  Patient can return home with the following  A lot of help with walking and/or transfers;A lot of help with bathing/dressing/bathroom;Assistance with cooking/housework;Assist for transportation;Help with stairs or ramp for entrance   Equipment Recommendations  None recommended by OT          Precautions / Restrictions Precautions Precautions: Fall Restrictions Weight Bearing Restrictions: No       Mobility Bed Mobility Overal bed mobility: Needs Assistance Bed Mobility: Supine to Sit, Sit to Supine     Supine to sit: Min guard, HOB elevated Sit to supine: Mod assist   General bed mobility comments: Much extended time and use of rails for pt to complete supine to sit bed mobility. Moderate assist via lifting of B LE into bed for sit to supine.    Transfers                   General  transfer comment: Pt reported feeling too weak to attempt transfer to chair today.     Balance Overall balance assessment: Needs assistance Sitting-balance support: Feet supported, No upper extremity supported Sitting balance-Leahy Scale: Fair Sitting balance - Comments: fair seated at EOB; rest needed leaning  backwards to pillow today.                                     Cognition Arousal/Alertness: Awake/alert Behavior During Therapy: WFL for tasks assessed/performed Overall Cognitive Status: Within Functional Limits for tasks assessed                                          Exercises Exercises: General Upper Extremity, Other exercises General Exercises - Upper Extremity Shoulder Flexion: AROM, Both, 10 reps, Supine Shoulder ABduction: AROM, Both, 10 reps, Supine Shoulder Horizontal ABduction: AROM, Both, 10 reps, Supine (x10 internal and external rotation) Other Exercises Other Exercises: x10 protraction both UE seated at EOB                 Pertinent Vitals/ Pain       Pain Assessment Pain Assessment: No/denies pain                                                          Frequency  Min 2X/week        Progress Toward Goals  OT Goals(current goals can now be found in the care plan section)  Progress towards OT goals: Progressing toward goals  Acute Rehab OT Goals Patient Stated Goal: open to rehab to get stronger OT Goal Formulation: With patient Time For Goal Achievement: 01/13/22 Potential to Achieve Goals: Good ADL Goals Pt Will Perform Grooming: with supervision;standing Pt Will Perform Lower Body Bathing: with modified independence;sitting/lateral leans;with adaptive equipment Pt Will Perform Lower Body Dressing: with modified independence;sitting/lateral leans;with adaptive equipment Pt Will Transfer to Toilet: with min guard assist;with min assist;stand pivot transfer Pt/caregiver will Perform Home Exercise Program: Increased strength;Both right and left upper extremity;Independently  Plan Discharge plan remains appropriate                                    End of Session    OT Visit Diagnosis: Unsteadiness on feet (R26.81);Other abnormalities of gait and mobility (R26.89);Muscle  weakness (generalized) (M62.81)   Activity Tolerance Patient limited by fatigue   Patient Left in bed;with call bell/phone within reach;with family/visitor present   Nurse Communication          Time: 1191-4782 OT Time Calculation (min): 19 min  Charges: OT General Charges $OT Visit: 1 Visit OT Treatments $Therapeutic Exercise: 8-22 mins  Ferman Basilio OT, MOT  Larey Seat 01/03/2022, 9:32 AM

## 2022-01-03 NOTE — Progress Notes (Signed)
PROGRESS NOTE  Chelsea Meza QZE:[id number] DOB: 01/03/1936 DOA: 12/21/2021 PCP: Hal Morales, DO  Brief History:  86 year old female with a history of hypertension, hyperlipidemia, diabetes mellitus type 2, CKD stage IV, CHF, atrial fibrillation, hyperlipidemia presenting from [facility] secondary to altered mental status.  The patient was recently hospitalized from 12/16/2021 to 12/17/2021 secondary to acute on chronic renal failure.  She was discharged to [facility] at that time.  She states that she has been asking for bedtime snack around 8 PM the last 2 evenings, but she has not received them.  The patient's daughter called her earlier in the morning on 12/21/2021.  Apparently the patient was confused.  CBG was ultimately checked and she was noted to have a sugar of 12.  The patient was given glucagon by nursing home staff.  Glucose improved to 36.  Upon EMS arrival, her sugar improved to 73.  Nevertheless, the patient was brought to the emergency department for further evaluation and treatment.  The patient denies any fevers, chills, chest pain, shortness breath, cough, hemoptysis, nausea, vomiting, direct abdominal pain, dysuria, hematuria.  She states that her appetite has been pretty good. In the ED, the patient was afebrile and hemodynamically stable with oxygen saturation 100% room air.  BMP showed sodium 134, potassium 4.3, bicarbonate 22, glucose 64, serum creatinine 4.19.  LFTs were unremarkable.  WBC 4.7, hemoglobin 10.8, platelets 234,000.  EKG showed atrial fibrillation with no ST-T wave changes.  The patient was not actually noted to be hypothermic with a temperature 92.7.  She was placed on a warming blanket. Prolonged hospitalization due to slow renal recovery.  Nephrology consulted and following     Assessment and Plan: * Acute renal failure superimposed on stage 4 chronic kidney disease, unspecified acute renal failure type ([place]) Secondary to volume  depletion and hemodynamic changes Presented with serum creatinine 4.19 Baseline creatinine 3.0-3.3 D/c IVF, pt on hypervolemic side Not much improvement of renal function, likely progression of CKD Renal consult appreciated Discussed with pt and daughter about HD>>noncommital but likely would agree when time arrives or if sense of urgency >>BUN/Creatinine appear to be consistently trending up over last few days -Discussed with patient and daughter and they are agreeable to proceed with dialysis -General surgery following, plan on dialysis catheter placement 9/5 -Will probably have first dialysis session same day. -She has been set up with outpatient dialysis center for Monday, Wednesday, Friday   A-fib [facility]) Type unspecified Rate controlled Holding apixaban for dialysis catheter placement Continue coreg  Essential hypertension Continue amlodipine, carvedilol, hydralazine  Hyperkalemia Continue twice daily Lokelma Repeat BMP in am  Hypothermia Presented with temperature 92.7 Check cortisol level--16.6 12/16/2021 TSH 6.574, free T4--1.22 Check PCT--0.50 Blood culture x2 sets--neg to date Suspect hypothermia-related to hypoglycemia, now resolved  Chronic diastolic CHF (congestive heart failure) ([place]) 10/27/2021 echo EF 60 to 65%, no WMA, trivial MR Lasix held due to worsening renal function Further management of volume status to be done through dialysis  Hypoglycemia associated with type 2 diabetes mellitus ([place]) Suspect variable oral intake in the setting of long-acting Lantus and short acting insulin with worsen renal funciton Received Humalog 6 units 3 times daily at SNF Holding Lantus NovoLog sliding scale--sensitive scale 10/26/2021 hemoglobin A1c 6.7 No further hypoglycemic episodes  Hyperlipidemia Continue statin  Anemia of chronic kidney disease -She has been receiving intravenous iron and ESA -She received 1 unit of PRBC for hemoglobin of 7.1 -  Follow-up  hemoglobin improved to 8.4  Goals of care -palliative care following -patient now DNR    Family Communication:   daughter updated at bedside 9/5   Consultants:  nephrology, palliative care, general surgery   Code Status:  DNR    DVT Prophylaxis: SCDs     Procedures: As Listed in Progress Note Above   Antibiotics: None     Subjective: She denies any complaints today  Objective: Vitals:   01/02/22 1416 01/02/22 1705 01/02/22 2246 01/03/22 0549  BP: 124/62 (!) 143/75 137/83 132/64  Pulse: 91 (!) 58 74 (!) 46  Resp: '19 18 18 18  '$ Temp: 98 F (36.7 C) 98.1 F (36.7 C) 98 F (36.7 C) 98.1 F (36.7 C)  TempSrc: Oral Oral Oral Oral  SpO2: 100% 100% 100% 99%  Weight:      Height:        Intake/Output Summary (Last 24 hours) at 01/03/2022 1130 Last data filed at 01/03/2022 0700 Gross per 24 hour  Intake 633 ml  Output 800 ml  Net -167 ml    Weight change:  Exam:  General:  Pt is alert, follows commands appropriately, not in acute distress HEENT: No icterus, No thrush, No neck mass, /AT Cardiovascular: IRRR, S1/S2, no rubs, no gallops Respiratory: bibasilar crackles. No wheeze Abdomen: Soft/+BS, non tender, non distended, no guarding Extremities: 1+LE  edema, No lymphangitis, No petechiae, No rashes, no synovitis   Data Reviewed: I have personally reviewed following labs and imaging studies Basic Metabolic Panel: Recent Labs  Lab 12/30/21 0402 12/31/21 0500 01/01/22 0447 01/02/22 0450 01/03/22 0405  NA 130* 132* 132* 130* 131*  K 5.3* 5.6* 5.5* 5.0 4.7  CL 101 102 101 100 99  CO2 19* '23 22 22 '$ 20*  GLUCOSE 160* 145* 182* 177* 154*  BUN 118* 122* 124* 128* 130*  CREATININE 4.46* 4.48* 4.55* 4.56* 4.88*  CALCIUM 8.3* 8.5* 8.4* 8.1* 8.3*  PHOS 7.6* 7.9* 7.8* 7.6* 7.7*   Liver Function Tests: Recent Labs  Lab 12/30/21 0402 12/31/21 0500 01/01/22 0447 01/02/22 0450 01/03/22 0405  ALBUMIN 2.3* 2.3* 2.3* 2.1* 2.2*   No results for input(s):  "LIPASE", "AMYLASE" in the last 168 hours. No results for input(s): "AMMONIA" in the last 168 hours. Coagulation Profile: No results for input(s): "INR", "PROTIME" in the last 168 hours. CBC: Recent Labs  Lab 12/28/21 0905 12/29/21 0451 12/30/21 0922 01/02/22 0858 01/03/22 0405  WBC 5.9 5.8 5.9 7.4 8.6  HGB 8.2* 7.5* 8.5* 7.1* 8.4*  HCT 25.9* 22.8* 26.1* 21.7* 24.9*  MCV 94.2 89.8 90.6 92.3 89.6  PLT 226 266 340 285 279   Cardiac Enzymes: No results for input(s): "CKTOTAL", "CKMB", "CKMBINDEX", "TROPONINI" in the last 168 hours. BNP: Invalid input(s): "POCBNP" CBG: Recent Labs  Lab 01/02/22 0747 01/02/22 1107 01/02/22 1649 01/02/22 2240 01/03/22 0812  GLUCAP 172* 223* 166* 153* 145*   HbA1C: No results for input(s): "HGBA1C" in the last 72 hours. Urine analysis:    Component Value Date/Time   COLORURINE YELLOW 12/27/2021 0810   APPEARANCEUR CLEAR 12/27/2021 0810   LABSPEC 1.010 12/27/2021 0810   PHURINE 5.0 12/27/2021 0810   GLUCOSEU NEGATIVE 12/27/2021 0810   HGBUR NEGATIVE 12/27/2021 0810   BILIRUBINUR NEGATIVE 12/27/2021 0810   KETONESUR NEGATIVE 12/27/2021 0810   PROTEINUR 100 (A) 12/27/2021 0810   UROBILINOGEN 0.2 07/13/2012 1236   NITRITE NEGATIVE 12/27/2021 0810   LEUKOCYTESUR NEGATIVE 12/27/2021 0810   Sepsis Labs: '@LABRCNTIP'$ (procalcitonin:4,lacticidven:4) ) No results found for this or any  previous visit (from the past 240 hour(s)).    Scheduled Meds:  amLODipine  10 mg Oral Daily   atorvastatin  40 mg Oral QHS   carvedilol  3.125 mg Oral BID WC   Chlorhexidine Gluconate Cloth  6 each Topical Q0600   darbepoetin (ARANESP) injection - NON-DIALYSIS  200 mcg Subcutaneous Q Thu-1800   feeding supplement  237 mL Oral BID BM   hydrALAZINE  100 mg Oral BID   insulin aspart  0-5 Units Subcutaneous QHS   insulin aspart  0-9 Units Subcutaneous TID WC   polyethylene glycol  17 g Oral Daily   polyvinyl alcohol  1 drop Both Eyes TID   senna  2 tablet  Oral Daily   sodium zirconium cyclosilicate  10 g Oral BID   Continuous Infusions:    Procedures/Studies: US RENAL  Result Date: 12/27/2021 CLINICAL DATA:  Acute kidney insufficiency on admission. EXAM: RENAL / URINARY TRACT ULTRASOUND COMPLETE COMPARISON:  Renal ultrasound 04/05/2020, CT abdomen and pelvis 02/21/2021 FINDINGS: Right Kidney: Renal measurements: 9.7 x 4.7 x 5.4 cm = volume: 129 mL. Increased cortical echogenicity as can be seen with medical renal disease, similar to prior. Lateral right midpole hypoechoic cyst measuring up to 1.1 cm, benign. No suspicious right renal mass is seen. Left Kidney: Limited evaluation of the left kidney due to overlying bowel gas. Renal measurements: Maximally 9.0 x 4.5 x 3.2 cm = volume: Approximately 67 mL. There again appears to be increased echogenicity of the left renal cortex as can be seen with medical renal disease. No gross cyst or solid mass is seen. Bladder: Only mildly distended, limiting evaluation. Other: None. IMPRESSION: 1. Evaluation of the left kidney is limited by overlying bowel gas. No hydronephrosis is seen either side. 2. There is again increased cortical echogenicity as can be seen with medical renal disease. Electronically Signed   By: Yvonne Kendall M.D.   On: 12/27/2021 10:47   DG Chest Portable 1 View  Result Date: 12/16/2021 CLINICAL DATA:  Difficult the rising to standing position. EXAM: PORTABLE CHEST 1 VIEW COMPARISON:  11/10/2021 FINDINGS: Lungs are hypoinflated and otherwise clear. Mild stable cardiomegaly. Remainder of the exam is unchanged. IMPRESSION: Hypoinflation without acute cardiopulmonary disease. Electronically Signed   By: Marin Olp M.D.   On: 12/16/2021 13:15    Kathie Dike, MD  [facility]  If 7PM-7AM, please contact night-coverage [email or web address] 01/03/2022, 11:30 AM   LOS: 13 days

## 2022-01-03 NOTE — Progress Notes (Signed)
[facility]  Cxr ordered. Catheter in place. Tried to update family but no answer. And no answer in room. Updated Rns.   Curlene Labrum, MD [facility] [address] [place], [place] [address] [phone] (office)

## 2022-01-03 NOTE — Op Note (Signed)
Operative Note 01/03/22   Preoperative Diagnosis: End Stage Renal Disease    Postoperative Diagnosis: Same   Procedure(s) Performed: Tunneled Dialysis Catheter Placement, Right Internal Jugular    Surgeon: Lanell Matar. Constance Haw, MD   Assistants: No qualified resident was available   Anesthesia: Monitored anesthesia care   Anesthesiologist: Denese Killings, MD    Specimens: None   Estimated Blood Loss: Minimal   Fluoroscopy time: 9 seconds   Blood Replacement: None    Complications: None    Operative Findings: Short thoracic cavity, small stable hematoma around internal jugular medial   Indications: [name] is a 86 yo who has ESRD and needs to start dialysis. We discussed tunneled catheter placement under fluoroscopy and with ultrasound. We discussed the risk of bleeding, infection, pneumothorax, injury to vessels and malfunction. She opted to proceed.   Procedure: The patient was brought into the operating room and monitored anesthesia carew as induced.   The right chest and neck was prepped and draped in the usual sterile fashion.  Preoperative antibiotics was given.   An Ultrasound was used to verify that the right internal jugular vein was patent.  One percent lidocaine was used for local anesthesia.  The patient was measured and a 23 cm Palindrome dual lumen dialysis catheter was chosen due to her height of 5 '10".  The needles advanced and initially hit the carotid. This was removed and pressure was held. Then the needle was advanced into the right internal jugular vein using the Seldinger technique without difficulty.  A guidewire was then advanced and could be seen coiling in the internal jugular vein. This was removed and the wire was injured so a new wire was obtained.  Using the ultrasound I noted a small hematoma medial around the muscle.  I found a window lower area down at the clavicle to access the right internal jugular vein and the needle was inserted and a Bentson  guidewire was advanced easily into the right atrium under fluoroscopic guidance.  Ectopia was noted briefly and resolved.  The wire was secured.  An incision was made over the right chest and the catheter was tunneled to the neck.  The ultrasound again confirmed the wire was going into the vein only. Dilators were used over the wire to dilate the track.  An introducer and peel-away sheath were placed over the guidewire. The catheter was then inserted through the peel-away sheath and the peel-away sheath was removed.  A spot film was performed to confirm the position.  At this time the catheter was very low in the atrium, and she looked like she had a shorter thoracic cavity.  Given this, I wired the catheter back out and replaced the catheter with a 19 cm Palindrome dual lumen dialysis catheter through the tunnel. Once this was done, the catheter drew back and flushed easily. Spot films confirmed a better position in the superior vena cava atrial junction.  The lumens were packed with heparin. Hemostats were used to position the catheter in the neck incision. I checked back at the neck with and ultrasound and a small hematoma was noted medial to the vein and muscle and this was stable. The neck incision was closed with 4-0 Monocryl and Dermabond. The catheter was secured with 2-0 silk suture and a sterile Biopatch and dressing was applied.  Hemostasis was confirmed. There was no signs of hematoma on the outside of the neck and no difficulty with breathing or ventilation.     All tape  and needle counts were correct at the end of the procedure. The patient was transferred to PACU in stable condition. A chest x-ray will be performed at that time.  Curlene Labrum, MD [facility] [address] [place], [place] [address] [phone] (office)

## 2022-01-03 NOTE — Progress Notes (Signed)
[facility]  Updated daughter [name] as she called back.   Curlene Labrum, MD [facility] [address] [place], [place] [address] [phone] (office)

## 2022-01-03 NOTE — Progress Notes (Signed)
Patient ID: [name], female   DOB: 21-Apr-1936, 86 y.o.   MRN: [id number] S: Feels well, no complaints.  O:BP 132/64 (BP Location: Left Arm)   Pulse (!) 46   Temp 98.1 F (36.7 C) (Oral)   Resp 18   Ht '5\' 10"'$  (1.778 m)   Wt 74.5 kg   SpO2 99%   BMI 23.56 kg/m   Intake/Output Summary (Last 24 hours) at 01/03/2022 1104 Last data filed at 01/03/2022 0700 Gross per 24 hour  Intake 633 ml  Output 800 ml  Net -167 ml   Intake/Output: I/O last 3 completed shifts: In: 7035 [P.O.:480; I.V.:25; Blood:368; IV Piggyback:275] Out: 1400 [KKXFG:1829]  Intake/Output this shift:  No intake/output data recorded. Weight change:  Gen: NAD CVS: bradycardic, no rub Resp:CTA Abd:+BS, soft, NT/ND Ext: 1+ bilateral lower extremity edema  Recent Labs  Lab 12/28/21 0346 12/29/21 0451 12/30/21 0402 12/31/21 0500 01/01/22 0447 01/02/22 0450 01/03/22 0405  NA 131* 130* 130* 132* 132* 130* 131*  K 5.6* 5.4* 5.3* 5.6* 5.5* 5.0 4.7  CL 103 100 101 102 101 100 99  CO2 21* 20* 19* '23 22 22 '$ 20*  GLUCOSE 151* 172* 160* 145* 182* 177* 154*  BUN 108* 116* 118* 122* 124* 128* 130*  CREATININE 4.36* 4.61* 4.46* 4.48* 4.55* 4.56* 4.88*  ALBUMIN 2.1* 2.3* 2.3* 2.3* 2.3* 2.1* 2.2*  CALCIUM 8.1* 8.0* 8.3* 8.5* 8.4* 8.1* 8.3*  PHOS 7.5* 8.0* 7.6* 7.9* 7.8* 7.6* 7.7*   Liver Function Tests: Recent Labs  Lab 01/01/22 0447 01/02/22 0450 01/03/22 0405  ALBUMIN 2.3* 2.1* 2.2*   No results for input(s): "LIPASE", "AMYLASE" in the last 168 hours. No results for input(s): "AMMONIA" in the last 168 hours. CBC: Recent Labs  Lab 12/28/21 0905 12/29/21 0451 12/30/21 0922 01/02/22 0858 01/03/22 0405  WBC 5.9 5.8 5.9 7.4 8.6  HGB 8.2* 7.5* 8.5* 7.1* 8.4*  HCT 25.9* 22.8* 26.1* 21.7* 24.9*  MCV 94.2 89.8 90.6 92.3 89.6  PLT 226 266 340 285 279   Cardiac Enzymes: No results for input(s): "CKTOTAL", "CKMB", "CKMBINDEX", "TROPONINI" in the last 168 hours. CBG: Recent Labs  Lab 01/02/22 0747  01/02/22 1107 01/02/22 1649 01/02/22 2240 01/03/22 0812  GLUCAP 172* 223* 166* 153* 145*    Iron Studies: No results for input(s): "IRON", "TIBC", "TRANSFERRIN", "FERRITIN" in the last 72 hours. Studies/Results: No results found.  amLODipine  10 mg Oral Daily   atorvastatin  40 mg Oral QHS   carvedilol  3.125 mg Oral BID WC   Chlorhexidine Gluconate Cloth  6 each Topical Q0600   darbepoetin (ARANESP) injection - NON-DIALYSIS  200 mcg Subcutaneous Q Thu-1800   feeding supplement  237 mL Oral BID BM   hydrALAZINE  100 mg Oral BID   insulin aspart  0-5 Units Subcutaneous QHS   insulin aspart  0-9 Units Subcutaneous TID WC   polyethylene glycol  17 g Oral Daily   polyvinyl alcohol  1 drop Both Eyes TID   senna  2 tablet Oral Daily   sodium zirconium cyclosilicate  10 g Oral BID    BMET    Component Value Date/Time   NA 131 (L) 01/03/2022 0405   K 4.7 01/03/2022 0405   CL 99 01/03/2022 0405   CO2 20 (L) 01/03/2022 0405   GLUCOSE 154 (H) 01/03/2022 0405   BUN 130 (H) 01/03/2022 0405   CREATININE 4.88 (H) 01/03/2022 0405   CALCIUM 8.3 (L) 01/03/2022 0405   GFRNONAA 8 (L) 01/03/2022 0405  GFRAA 28 (L) 10/22/2018 0526   CBC    Component Value Date/Time   WBC 8.6 01/03/2022 0405   RBC 2.78 (L) 01/03/2022 0405   HGB 8.4 (L) 01/03/2022 0405   HCT 24.9 (L) 01/03/2022 0405   PLT 279 01/03/2022 0405   MCV 89.6 01/03/2022 0405   MCH 30.2 01/03/2022 0405   MCHC 33.7 01/03/2022 0405   RDW 13.5 01/03/2022 0405   LYMPHSABS 1.2 12/21/2021 1207   MONOABS 0.2 12/21/2021 1207   EOSABS 0.0 12/21/2021 1207   BASOSABS 0.0 12/21/2021 1207   Assessment/Plan:   AKI/CKD stage IV - Longstanding CKD presumably due to DM and HTN.  Was followed by Dr. Theador Hawthorne but not seen since September 2021.  Scr was 2.63 and 24 hour creatinine clearance 18 mL/min on 12/12/19.  Scr was 2.4-2.65 in 2022 but has been ranging 3.1-3.5 prior to admission when was 4.2.   She would be a marginal candidate given  her poor nutritional and functional status but again feel that CKD could be responsible for both.  Different modalities of RRT have already been d/w with Mrs. Marlett and her daughter.  Her daughter has decided she wants to proceed-  wants PD but can only do HD while at [facility]. Eliquis on hold.  Plan to initiate HD after [facility] placed today by Dr. Constance Haw.  Will dialyze afterwards for a short 1st treatment and then 2nd on Wed or Thursday depending upon census.  Will also need outpatient HD arrangements. Hyponatremia - felt to be hypervolemic secondary to ESRD + dCHF.  UF with HD. Hyperkalemia - Due to #1.  Continue with lokelma  bid.  Lasix on hold for now as above.  Will stop Lokelma once we start HD.  MGUS - SPEP/UPEP pending (has not been checked in over 2 years). Anemia of CKD stage IV - Hgb down to 8.6.  iron stores low-  will give iron and ESA-  and transfuse for Hgb <7.  Likely contributing to her FTT as well  Atrial fibrillation - rate controlled, per primary Chronic diastolic CHF - ECHO on 07/30/00 concerning for cardiac amyloid.  Has never seen a Cardiologist. Still overloaded DM type 2 - presented with hypoglycemia.  Plan per primary svc Disposition - came from SNF and not strong enough to go home.  She is a marginal dialysis candidate as above.  But was doing well 2 mos ago-  Volume overload and anemia are likely contributing to her FTT and wont significantly improve UNLESS we start HD.but one never knows how it is going to go  Poor performance status and advanced age.  Donetta Potts, MD [facility]

## 2022-01-03 NOTE — Progress Notes (Signed)
There was confusion as to whether the patient had any toast or bacon that was served this morning on the breakfast tray.  The patient denies eating anything.  Family stated that they removed the bacon and ate some of the toast.  Preoperative orders were not released until now.

## 2022-01-03 NOTE — Interval H&P Note (Signed)
History and Physical Interval Note:  01/03/2022 1:05 PM  Chelsea Meza  has presented today for surgery, with the diagnosis of renal failure, need for dialysis.  The various methods of treatment have been discussed with the patient and family. After consideration of risks, benefits and other options for treatment, the patient has consented to  Procedure(s): INSERTION OF DIALYSIS CATHETER (Right) as a surgical intervention.  The patient's history has been reviewed, patient examined, no change in status, stable for surgery.  I have reviewed the patient's chart and labs.  Questions were answered to the patient's satisfaction.    Patient seen by my partner for dialysis catheter. I discussed risk of bleeding, infection, pneumothorax, and malfunction of the catheter, injury to vessels. Will update her family after procedure.  Virl Cagey

## 2022-01-03 NOTE — Anesthesia Preprocedure Evaluation (Signed)
Anesthesia Evaluation  Patient identified by MRN, date of birth, ID band Patient awake    Reviewed: Allergy & Precautions, NPO status , Patient's Chart, lab work & pertinent test results, reviewed documented beta blocker date and time   Airway Mallampati: III  TM Distance: >3 FB Neck ROM: Full    Dental  (+) Dental Advisory Given, Missing   Pulmonary neg pulmonary ROS,    Pulmonary exam normal breath sounds clear to auscultation       Cardiovascular Exercise Tolerance: Poor hypertension, Pt. on medications and Pt. on home beta blockers +CHF   Rhythm:Regular Rate:Normal + Systolic murmurs 1. Echocardiographic features concerning for infiltrative disease such as  amyloidosis. Left ventricular ejection fraction, by estimation, is 60 to  65%. The left ventricle has normal function. The left ventricle has no  regional wall motion abnormalities.  There is severe left ventricular hypertrophy. Left ventricular diastolic  function could not be evaluated.  2. Right ventricular systolic function is normal. The right ventricular  size is normal. Moderately increased right ventricular wall thickness.  There is mildly elevated pulmonary artery systolic pressure. The estimated  right ventricular systolic pressure  is 36.6 mmHg.  3. Left atrial size was moderately dilated.  4. A small pericardial effusion is present. The pericardial effusion is  localized near the right atrium. There is no evidence of cardiac  tamponade.  5. The mitral valve is abnormal. Trivial mitral valve regurgitation.  6. The aortic valve is tricuspid. Aortic valve regurgitation is trivial.  Aortic valve sclerosis is present, with no evidence of aortic valve  stenosis.  7. Aortic dilatation noted. There is mild dilatation of the aortic root,  measuring 40 mm.  8. The inferior vena cava is normal in size with <50% respiratory  variability, suggesting right  atrial pressure of 8 mmHg.    Neuro/Psych Seizures -, Well Controlled,  TIAnegative psych ROS   GI/Hepatic negative GI ROS, Neg liver ROS,   Endo/Other  diabetes, Well Controlled, Type 2, Insulin Dependent, Oral Hypoglycemic Agents  Renal/GU ESRFRenal disease  negative genitourinary   Musculoskeletal negative musculoskeletal ROS (+)   Abdominal   Peds negative pediatric ROS (+)  Hematology  (+) Blood dyscrasia, anemia ,   Anesthesia Other Findings   Reproductive/Obstetrics negative OB ROS                            Anesthesia Physical Anesthesia Plan  ASA: 4  Anesthesia Plan: General   Post-op Pain Management: Minimal or no pain anticipated   Induction: Intravenous  PONV Risk Score and Plan: Propofol infusion  Airway Management Planned: Nasal Cannula, Natural Airway and Simple Face Mask  Additional Equipment:   Intra-op Plan:   Post-operative Plan:   Informed Consent: I have reviewed the patients History and Physical, chart, labs and discussed the procedure including the risks, benefits and alternatives for the proposed anesthesia with the patient or authorized representative who has indicated his/her understanding and acceptance.    Discussed DNR with patient and Suspend DNR.   Dental advisory given  Plan Discussed with: CRNA and Surgeon  Anesthesia Plan Comments: (Patient awake, alert , oriented, discussed DNR, she wants me to suspend DNR during the procedure.)       Anesthesia Quick Evaluation

## 2022-01-03 NOTE — Anesthesia Postprocedure Evaluation (Signed)
Anesthesia Post Note  Patient: [name]  Procedure(s) Performed: INSERTION OF DIALYSIS CATHETER (Right: Chest)  Patient location during evaluation: Phase II Anesthesia Type: General Level of consciousness: awake and alert and oriented Pain management: pain level controlled Vital Signs Assessment: post-procedure vital signs reviewed and stable Respiratory status: spontaneous breathing, nonlabored ventilation and respiratory function stable Cardiovascular status: blood pressure returned to baseline and stable Postop Assessment: no apparent nausea or vomiting Anesthetic complications: no   No notable events documented.   Last Vitals:  Vitals:   01/03/22 1430 01/03/22 1454  BP: (!) 158/79 (!) 173/79  Pulse: 68 71  Resp: 12 17  Temp: 36.6 C 36.6 C  SpO2: 97% 100%    Last Pain:  Vitals:   01/03/22 1454  TempSrc: Oral  PainSc:                  Wilbur Labuda C Yanilen Adamik

## 2022-01-03 NOTE — Procedures (Signed)
   INITIAL HEMODIALYSIS TREATMENT NOTE (HD#1):   First ever HD session completed using newly placed RIJ TDC.  Catheter tolerated prescribed flow with stable pressures.  Pre-HD NSR with PACs 90s.  Upon initiation of HD, HR^ 120.  Pt asymptomatic.  Known h/o Afib. Cardiac meds held today for catheter placement / first HD. Discussed with Dr. Roderic Palau who ordered ongoing tele monitoring.  Frequency of intra-dialytic NS was decreased to allow for a slower UF rate (591 ml/h) and HR returned to 85-95 baseline.   2.5 hour heparin-free session completed. Goal met: 1 liter removed without interruption in UF.  All blood was returned.  No changes from pre-HD assessment.  Hand-off given to Harvie Heck, LPN.   Rockwell Alexandria, RN

## 2022-01-03 NOTE — Transfer of Care (Signed)
Immediate Anesthesia Transfer of Care Note  Patient: Chelsea Meza  Procedure(s) Performed: INSERTION OF DIALYSIS CATHETER (Right: Chest)  Patient Location: PACU  Anesthesia Type:MAC  Level of Consciousness: awake, alert  and oriented  Airway & Oxygen Therapy: Patient Spontanous Breathing  Post-op Assessment: Report given to RN and Post -op Vital signs reviewed and stable  Post vital signs: Reviewed and stable  Last Vitals:  Vitals Value Taken Time  BP 147/76 01/03/22 1419  Temp    Pulse 72 01/03/22 1421  Resp 12 01/03/22 1421  SpO2 99 % 01/03/22 1421  Vitals shown include unvalidated device data.  Last Pain:  Vitals:   01/03/22 1149  TempSrc: Oral  PainSc: 0-No pain      Patients Stated Pain Goal: 4 (90/93/11 2162)  Complications: No notable events documented.

## 2022-01-04 DIAGNOSIS — N179 Acute kidney failure, unspecified: Secondary | ICD-10-CM | POA: Diagnosis not present

## 2022-01-04 LAB — RENAL FUNCTION PANEL
Albumin: 2.3 g/dL — ABNORMAL LOW (ref 3.5–5.0)
Anion gap: 10 (ref 5–15)
BUN: 100 mg/dL — ABNORMAL HIGH (ref 8–23)
CO2: 22 mmol/L (ref 22–32)
Calcium: 8.2 mg/dL — ABNORMAL LOW (ref 8.9–10.3)
Chloride: 99 mmol/L (ref 98–111)
Creatinine, Ser: 3.96 mg/dL — ABNORMAL HIGH (ref 0.44–1.00)
GFR, Estimated: 11 mL/min — ABNORMAL LOW (ref >60)
Glucose, Bld: 159 mg/dL — ABNORMAL HIGH (ref 70–99)
Phosphorus: 6.2 mg/dL — ABNORMAL HIGH (ref 2.5–4.6)
Potassium: 4 mmol/L (ref 3.5–5.1)
Sodium: 131 mmol/L — ABNORMAL LOW (ref 135–145)

## 2022-01-04 LAB — CBC
HCT: 25.3 % — ABNORMAL LOW (ref 36.0–46.0)
Hemoglobin: 8.5 g/dL — ABNORMAL LOW (ref 12.0–15.0)
MCH: 30.7 pg (ref 26.0–34.0)
MCHC: 33.6 g/dL (ref 30.0–36.0)
MCV: 91.3 fL (ref 80.0–100.0)
Platelets: 282 10*3/uL (ref 150–400)
RBC: 2.77 MIL/uL — ABNORMAL LOW (ref 3.87–5.11)
RDW: 13.5 % (ref 11.5–15.5)
WBC: 8.2 10*3/uL (ref 4.0–10.5)
nRBC: 0 % (ref 0.0–0.2)

## 2022-01-04 LAB — GLUCOSE, CAPILLARY
Glucose-Capillary: 134 mg/dL — ABNORMAL HIGH (ref 70–99)
Glucose-Capillary: 173 mg/dL — ABNORMAL HIGH (ref 70–99)

## 2022-01-04 MED ORDER — APIXABAN 2.5 MG PO TABS
2.5000 mg | ORAL_TABLET | Freq: Two times a day (BID) | ORAL | Status: DC
  Administered 2022-01-05 – 2022-01-08 (×7): 2.5 mg via ORAL
  Filled 2022-01-04 (×8): qty 1

## 2022-01-04 MED ORDER — DARBEPOETIN ALFA 200 MCG/0.4ML IJ SOSY
PREFILLED_SYRINGE | INTRAMUSCULAR | Status: AC
  Administered 2022-01-04: 200 ug via SUBCUTANEOUS
  Filled 2022-01-04: qty 0.4

## 2022-01-04 MED ORDER — DARBEPOETIN ALFA 200 MCG/0.4ML IJ SOSY
200.0000 ug | PREFILLED_SYRINGE | INTRAMUSCULAR | Status: DC
  Filled 2022-01-04: qty 0.4

## 2022-01-04 MED ORDER — SODIUM CHLORIDE 0.9 % IV SOLN
100.0000 mg | INTRAVENOUS | Status: DC
  Administered 2022-01-04 – 2022-01-06 (×2): 100 mg via INTRAVENOUS
  Filled 2022-01-04 (×3): qty 5

## 2022-01-04 MED ORDER — HEPARIN SODIUM (PORCINE) 1000 UNIT/ML IJ SOLN
INTRAMUSCULAR | Status: AC
  Administered 2022-01-04: 3200 [IU]
  Filled 2022-01-04: qty 4

## 2022-01-04 MED ORDER — SODIUM ZIRCONIUM CYCLOSILICATE 10 G PO PACK: 10.0000 g | PACK | Freq: Every day | ORAL | Status: DC

## 2022-01-04 NOTE — Progress Notes (Signed)
PROGRESS NOTE  Chelsea Meza YQM:[id number] DOB: 1936-02-15 DOA: 12/21/2021 PCP: Hal Morales, DO  Brief History:  86 year old female with a history of hypertension, hyperlipidemia, diabetes mellitus type 2, CKD stage IV, CHF, atrial fibrillation, hyperlipidemia presenting from [facility] secondary to altered mental status.  The patient was recently hospitalized from 12/16/2021 to 12/17/2021 secondary to acute on chronic renal failure.  She was discharged to [facility] at that time.  She states that she has been asking for bedtime snack around 8 PM the last 2 evenings, but she has not received them.  The patient's daughter called her earlier in the morning on 12/21/2021.  Apparently the patient was confused.  CBG was ultimately checked and she was noted to have a sugar of 12.  The patient was given glucagon by nursing home staff.  Glucose improved to 36.  Upon EMS arrival, her sugar improved to 73.  Nevertheless, the patient was brought to the emergency department for further evaluation and treatment.  The patient denies any fevers, chills, chest pain, shortness breath, cough, hemoptysis, nausea, vomiting, direct abdominal pain, dysuria, hematuria.  She states that her appetite has been pretty good. In the ED, the patient was afebrile and hemodynamically stable with oxygen saturation 100% room air.  BMP showed sodium 134, potassium 4.3, bicarbonate 22, glucose 64, serum creatinine 4.19.  LFTs were unremarkable.  WBC 4.7, hemoglobin 10.8, platelets 234,000.  EKG showed atrial fibrillation with no ST-T wave changes.  The patient was not actually noted to be hypothermic with a temperature 92.7.  She was placed on a warming blanket. Prolonged hospitalization due to slow renal recovery.  Nephrology consulted and following     Assessment and Plan: *CKD V- Prior to admission at baseline patient has CKD 4  -Renal status worsened in the setting of  volume depletion and hemodynamic  changes Baseline creatinine 3.0-3.3 prior to admission -Despite IV fluids and interventions renal function did not recover -Renal consult appreciated -General surgery placed HD catheter on 01/03/2022 --Hemodialysis on 01/03/2022 and 01/04/2022 -Patient will need outpatient hemodialysis chair prior to discharge    Paroxysmal A-fib ([place]) Rate controlled Restart apixaban at discharge Continue coreg for rate control   Essential hypertension Continue amlodipine, carvedilol, hydralazine   Hyperkalemia Resolved with Lokelma and hemodialysis sessions   Hypothermia Presented with temperature 92.7 cortisol level--16.6 12/16/2021 TSH 6.574, free T4--1.22 PCT--0.50 Blood culture x2 sets--neg to date Suspect hypothermia-related to hypoglycemia, now resolved   Chronic diastolic CHF (congestive heart failure) ([place]) 10/27/2021 echo EF 60 to 65%, no WMA, trivial MR Further management of volume status to be done through dialysis   Hypoglycemia associated with type 2 diabetes mellitus ([place]) Suspect variable oral intake in the setting of long-acting Lantus and short acting insulin with worsen renal funciton Received Humalog 6 units 3 times daily at SNF Holding Lantus NovoLog sliding scale--sensitive scale 10/26/2021 hemoglobin A1c 6.7 No further hypoglycemic episodes   Hyperlipidemia Continue statin   Anemia of chronic kidney disease -She has been receiving intravenous iron and ESA -She received 1 unit of PRBC for hemoglobin of 7.1 -Follow-up hemoglobin improved     Goals of care -palliative care following -patient now DNR    Family Communication:   daughter updated previously   Consultants:  nephrology, palliative care, general surgery   Code Status:  DNR    DVT Prophylaxis: SCDs     Procedures: As Listed in Progress Note Above   Antibiotics: None  Subjective: -Eagerly awaiting possible discharge after hemodialysis arrangements have been made for outpatient dialysis  center -Oral intake is fair -  Objective: Vitals:   01/04/22 1700 01/04/22 1715 01/04/22 1730 01/04/22 1746  BP: (!) 134/97 (!) 146/98 135/86 (!) 151/96  Pulse:      Resp: '11 20 16 14  '$ Temp:    98.3 F (36.8 C)  TempSrc:    Oral  SpO2:    100%  Weight:    92.8 kg  Height:        Intake/Output Summary (Last 24 hours) at 01/04/2022 1919 Last data filed at 01/04/2022 1822 Gross per 24 hour  Intake 525 ml  Output 3950 ml  Net -3425 ml    Weight change:  Exam:  General:  Pt is alert, follows commands appropriately, not in acute distress HEENT: No icterus, No thrush, No neck mass, [place]/AT, hemodialysis catheter Cardiovascular: IRRR, S1/S2, no rubs, no gallops Respiratory: bibasilar crackles. No wheeze Abdomen: Soft/+BS, non tender, non distended, no guarding Extremities: 1+LE  edema, No lymphangitis, No petechiae, No rashes, no synovitis   Data Reviewed: I have personally reviewed following labs and imaging studies Basic Metabolic Panel: Recent Labs  Lab 12/31/21 0500 01/01/22 0447 01/02/22 0450 01/03/22 0405 01/04/22 0335  NA 132* 132* 130* 131* 131*  K 5.6* 5.5* 5.0 4.7 4.0  CL 102 101 100 99 99  CO2 '23 22 22 '$ 20* 22  GLUCOSE 145* 182* 177* 154* 159*  BUN 122* 124* 128* 130* 100*  CREATININE 4.48* 4.55* 4.56* 4.88* 3.96*  CALCIUM 8.5* 8.4* 8.1* 8.3* 8.2*  PHOS 7.9* 7.8* 7.6* 7.7* 6.2*   Liver Function Tests: Recent Labs  Lab 12/31/21 0500 01/01/22 0447 01/02/22 0450 01/03/22 0405 01/04/22 0335  ALBUMIN 2.3* 2.3* 2.1* 2.2* 2.3*   No results for input(s): "LIPASE", "AMYLASE" in the last 168 hours. No results for input(s): "AMMONIA" in the last 168 hours. Coagulation Profile: No results for input(s): "INR", "PROTIME" in the last 168 hours. CBC: Recent Labs  Lab 12/29/21 0451 12/30/21 0922 01/02/22 0858 01/03/22 0405 01/04/22 0855  WBC 5.8 5.9 7.4 8.6 8.2  HGB 7.5* 8.5* 7.1* 8.4* 8.5*  HCT 22.8* 26.1* 21.7* 24.9* 25.3*  MCV 89.8 90.6 92.3 89.6 91.3   PLT 266 340 285 279 282   Cardiac Enzymes: No results for input(s): "CKTOTAL", "CKMB", "CKMBINDEX", "TROPONINI" in the last 168 hours. BNP: Invalid input(s): "POCBNP" CBG: Recent Labs  Lab 01/02/22 2240 01/03/22 0812 01/03/22 1720 01/03/22 2234 01/04/22 0739  GLUCAP 153* 145* 133* 139* 134*   HbA1C: No results for input(s): "HGBA1C" in the last 72 hours. Urine analysis:    Component Value Date/Time   COLORURINE YELLOW 12/27/2021 0810   APPEARANCEUR CLEAR 12/27/2021 0810   LABSPEC 1.010 12/27/2021 0810   PHURINE 5.0 12/27/2021 0810   GLUCOSEU NEGATIVE 12/27/2021 0810   HGBUR NEGATIVE 12/27/2021 0810   BILIRUBINUR NEGATIVE 12/27/2021 0810   KETONESUR NEGATIVE 12/27/2021 0810   PROTEINUR 100 (A) 12/27/2021 0810   UROBILINOGEN 0.2 07/13/2012 1236   NITRITE NEGATIVE 12/27/2021 0810   LEUKOCYTESUR NEGATIVE 12/27/2021 0810   Sepsis Labs: '@LABRCNTIP'$ (procalcitonin:4,lacticidven:4) ) No results found for this or any previous visit (from the past 240 hour(s)).    Scheduled Meds:  amLODipine  10 mg Oral Daily   [START ON 01/05/2022] apixaban  2.5 mg Oral BID   atorvastatin  40 mg Oral QHS   carvedilol  3.125 mg Oral BID WC   Chlorhexidine Gluconate Cloth  6 each Topical Daily  darbepoetin (ARANESP) injection - NON-DIALYSIS  200 mcg Subcutaneous Q Wed-1800   feeding supplement  237 mL Oral BID BM   hydrALAZINE  100 mg Oral BID   insulin aspart  0-5 Units Subcutaneous QHS   insulin aspart  0-9 Units Subcutaneous TID WC   polyethylene glycol  17 g Oral Daily   polyvinyl alcohol  1 drop Both Eyes TID   senna  2 tablet Oral Daily   Continuous Infusions:  iron sucrose 100 mg (01/04/22 1615)     Procedures/Studies: DG Chest Port 1 View  Result Date: 01/03/2022 CLINICAL DATA:  Status post dialysis catheter placement. EXAM: PORTABLE CHEST 1 VIEW COMPARISON:  December 16, 2021. FINDINGS: Stable cardiomegaly. Interval placement of right internal jugular dialysis catheter with  distal tip in expected position of superior vena cava. No pneumothorax is noted. Right lung is clear. Left basilar atelectasis is noted. Bony thorax is unremarkable. IMPRESSION: Interval placement of right internal jugular dialysis catheter with distal tip in expected position of SVC. Left basilar atelectasis is noted. Electronically Signed   By: Marijo Conception M.D.   On: 01/03/2022 14:28   DG C-Arm 1-60 Min-No Report  Result Date: 01/03/2022 Fluoroscopy was utilized by the requesting physician.  No radiographic interpretation.   US RENAL  Result Date: 12/27/2021 CLINICAL DATA:  Acute kidney insufficiency on admission. EXAM: RENAL / URINARY TRACT ULTRASOUND COMPLETE COMPARISON:  Renal ultrasound 04/05/2020, CT abdomen and pelvis 02/21/2021 FINDINGS: Right Kidney: Renal measurements: 9.7 x 4.7 x 5.4 cm = volume: 129 mL. Increased cortical echogenicity as can be seen with medical renal disease, similar to prior. Lateral right midpole hypoechoic cyst measuring up to 1.1 cm, benign. No suspicious right renal mass is seen. Left Kidney: Limited evaluation of the left kidney due to overlying bowel gas. Renal measurements: Maximally 9.0 x 4.5 x 3.2 cm = volume: Approximately 67 mL. There again appears to be increased echogenicity of the left renal cortex as can be seen with medical renal disease. No gross cyst or solid mass is seen. Bladder: Only mildly distended, limiting evaluation. Other: None. IMPRESSION: 1. Evaluation of the left kidney is limited by overlying bowel gas. No hydronephrosis is seen either side. 2. There is again increased cortical echogenicity as can be seen with medical renal disease. Electronically Signed   By: Yvonne Kendall M.D.   On: 12/27/2021 10:47   DG Chest Portable 1 View  Result Date: 12/16/2021 CLINICAL DATA:  Difficult the rising to standing position. EXAM: PORTABLE CHEST 1 VIEW COMPARISON:  11/10/2021 FINDINGS: Lungs are hypoinflated and otherwise clear. Mild stable cardiomegaly.  Remainder of the exam is unchanged. IMPRESSION: Hypoinflation without acute cardiopulmonary disease. Electronically Signed   By: Marin Olp M.D.   On: 12/16/2021 13:15    Obie Silos Denton Brick, MD  [facility]  If 7PM-7AM, please contact night-coverage [email or web address] 01/04/2022, 7:19 PM   LOS: 14 days

## 2022-01-04 NOTE — TOC Progression Note (Signed)
Transition of Care [facility]) - Progression Note    Patient Details  Name: Chelsea Meza MRN: [id number] Date of Birth: 08/09/1935  Transition of Care [facility]) CM/SW Contact  Boneta Lucks, RN Phone Number: 01/04/2022, 10:43 AM  Clinical Narrative:   Restarting INS AUTH today. Confirming [place] chair time to start on Friday.  She is scheduled for MWF - 2nd shift.     Expected Discharge Plan: Skilled Nursing Facility Barriers to Discharge: Continued Medical Work up  Expected Discharge Plan and Services Expected Discharge Plan: [place]     Readmission Risk Interventions    12/22/2021    2:27 PM 02/24/2021   12:38 PM  Readmission Risk Prevention Plan  Transportation Screening Complete Complete  PCP or Specialist Appt within 3-5 Days Complete   HRI or Home Care Consult Complete Patient refused  Social Work Consult for [place] Planning/Counseling Complete Complete  Palliative Care Screening Not Applicable Not Applicable  Medication Review [employer]) Complete Complete

## 2022-01-04 NOTE — Procedures (Signed)
   HEMODIALYSIS TREATMENT NOTE (HD#2):  Uneventful second HD session using RIJ tDC. Qb 300 / Qd 300 x 3 hours.  Hemodynamically stable throughout session. Goal met: 2 liters removed.  Venofer and Darbepoetin given.  All blood was returned.  Hand-off given to [facility], LPN.  Rockwell Alexandria, RN

## 2022-01-04 NOTE — Progress Notes (Signed)
Physical Therapy Treatment Patient Details Name: JOYCELYN Meza MRN: [id number] DOB: [birth year] Today's Date: 01/04/2022   History of Present Illness Chelsea Meza is a 86 year old female with a history of hypertension, hyperlipidemia, diabetes mellitus type 2, CKD stage IV, CHF, atrial fibrillation, hyperlipidemia presenting from [facility] secondary to altered mental status.  The patient was recently hospitalized from 12/16/2021 to 12/17/2021 secondary to acute on chronic renal failure.  She was discharged to [facility] at that time.  She states that she has been asking for bedtime snack around 8 PM the last 2 evenings, but she has not received them.  The patient's daughter called her earlier in the morning on 12/21/2021.  Apparently the patient was confused.  CBG was ultimately checked and she was noted to have a sugar of 12.  The patient was given glucagon by nursing home staff.  Glucose improved to 36.  Upon EMS arrival, her sugar improved to 73.  Nevertheless, the patient was brought to the emergency department for further evaluation and treatment.  The patient denies any fevers, chills, chest pain, shortness breath, cough, hemoptysis, nausea, vomiting, direct abdominal pain, dysuria, hematuria.  She states that her appetite has been pretty good.  In the ED, the patient was afebrile and hemodynamically stable with oxygen saturation 100% room air.  BMP showed sodium 134, potassium 4.3, bicarbonate 22, glucose 64, serum creatinine 4.19.  LFTs were unremarkable.  WBC 4.7, hemoglobin 10.8, platelets 234,000.  EKG showed atrial fibrillation with no ST-T wave changes.  The patient was not actually noted to be hypothermic with a temperature 92.7.  She was placed on a warming blanket.    PT Comments    Patient lying in bed on therapist arrival.  States she is "worn out" today after having dialysis port placed yesterday.  Patient is agreeable to therapy session.  Supine to sit with min to mod A  today to bring legs to edge of bed and to bring trunk fully upright.  She needs CGA for safety as she tends to lean posteriorly and to the right.  Patient performs seated lower extremity therapeutic exercise with therapist instruction.  Patient declines standing or transferring to chair today.  PT assists patient back to bed.  She need mod A for sit to supine for legs and to reposition in bed.  Patient will benefit from continued skilled therapy services during the remainder of her hospital stay and at the next recommended venue of care to address deficits and promote return to optimal function.       Recommendations for follow up therapy are one component of a multi-disciplinary discharge planning process, led by the attending physician.  Recommendations may be updated based on patient status, additional functional criteria and insurance authorization.  Follow Up Recommendations  Skilled nursing-short term rehab (<3 hours/day) Can patient physically be transported by private vehicle: Yes   Assistance Recommended at Discharge Intermittent Supervision/Assistance  Patient can return home with the following A lot of help with walking and/or transfers;A little help with bathing/dressing/bathroom;Assistance with cooking/housework;Help with stairs or ramp for entrance   Equipment Recommendations  None recommended by PT    Recommendations for Other Services       Precautions / Restrictions Precautions Precautions: Fall Restrictions Weight Bearing Restrictions: No     Mobility  Bed Mobility Overal bed mobility: Needs Assistance Bed Mobility: Supine to Sit, Sit to Supine     Supine to sit: Min guard, HOB elevated Sit to supine: Mod assist  General bed mobility comments: Much extended time and use of rails for pt to complete supine to sit bed mobility. Moderate assist via lifting of B LE into bed for sit to supine. Patient Response: Cooperative  Transfers Overall transfer level: Needs  assistance Equipment used: Rolling walker (2 wheels) Transfers: Sit to/from Stand Sit to Stand: Mod assist   Step pivot transfers: Mod assist       General transfer comment: Pt reported feeling too weak to attempt transfer to chair today.    Ambulation/Gait Ambulation/Gait assistance: Min assist   Assistive device: Rolling walker (2 wheels) Gait Pattern/deviations: Decreased step length - right, Decreased step length - left, Decreased stride length Gait velocity: decreased     General Gait Details: labored cadence to walk to chair on other side of room with RW   Stairs             Wheelchair Mobility    Modified Rankin (Stroke Patients Only)       Balance Overall balance assessment: Needs assistance Sitting-balance support: Feet supported, No upper extremity supported Sitting balance-Leahy Scale: Fair Sitting balance - Comments: fair seated at EOB; patient needs CGA/SBA for safety; tends to lean back and to the side   Standing balance support: During functional activity, Bilateral upper extremity supported, Reliant on assistive device for balance                                Cognition Arousal/Alertness: Awake/alert Behavior During Therapy: WFL for tasks assessed/performed Overall Cognitive Status: Within Functional Limits for tasks assessed                                          Exercises      General Comments        Pertinent Vitals/Pain Pain Assessment Pain Assessment: No/denies pain    Home Living                          Prior Function            PT Goals (current goals can now be found in the care plan section) Acute Rehab PT Goals Patient Stated Goal: return home after rehab PT Goal Formulation: With patient/family Time For Goal Achievement: 01/05/22 Potential to Achieve Goals: Good Progress towards PT goals: Progressing toward goals    Frequency    Min 2X/week      PT Plan  Current plan remains appropriate    Co-evaluation     PT goals addressed during session: Mobility/safety with mobility;Strengthening/ROM        AM-PAC PT "6 Clicks" Mobility   Outcome Measure  Help needed turning from your back to your side while in a flat bed without using bedrails?: A Little Help needed moving from lying on your back to sitting on the side of a flat bed without using bedrails?: A Lot Help needed moving to and from a bed to a chair (including a wheelchair)?: A Lot Help needed standing up from a chair using your arms (e.g., wheelchair or bedside chair)?: A Lot Help needed to walk in hospital room?: A Lot Help needed climbing 3-5 steps with a railing? : A Lot 6 Click Score: 13    End of Session   Activity Tolerance: Patient tolerated treatment well;Patient limited by fatigue  Patient left: in bed;with call bell/phone within reach Nurse Communication: Mobility status PT Visit Diagnosis: Unsteadiness on feet (R26.81);Other abnormalities of gait and mobility (R26.89);Muscle weakness (generalized) (M62.81)     Time: 8841-6606 PT Time Calculation (min) (ACUTE ONLY): 16 min  Charges:  $Therapeutic Activity: 8-22 mins                     9:29 AM, 01/04/22 Sonya Gunnoe Small Germain Koopmann MPT [place] [facility] [place] [phone] [phone]

## 2022-01-04 NOTE — Progress Notes (Signed)
Patient ID: Chelsea Meza, female   DOB: [birth year], 86 y.o.   MRN: [id number] S: Tolerated HD well yesterday.  No new complaints. O:BP (!) 179/66 (BP Location: Left Arm)   Pulse 85   Temp 98 F (36.7 C) (Oral)   Resp 17   Ht '5\' 10"'$  (1.778 m)   Wt 74.5 kg   SpO2 100%   BMI 23.56 kg/m   Intake/Output Summary (Last 24 hours) at 01/04/2022 1058 Last data filed at 01/04/2022 0500 Gross per 24 hour  Intake 1080 ml  Output 1612 ml  Net -532 ml   Intake/Output: I/O last 3 completed shifts: In: 1080 [P.O.:480; I.V.:500; IV Piggyback:100] Out: 2412 [Urine:1400; Other:1000; [place]; Blood:10]  Intake/Output this shift:  No intake/output data recorded. Weight change:  Gen:NAD CVS: RRR, no rub Resp: CTA Abd: +BS, soft, NT/ND Ext: 1+ pretibial edema bilaterally  Recent Labs  Lab 12/29/21 0451 12/30/21 0402 12/31/21 0500 01/01/22 0447 01/02/22 0450 01/03/22 0405 01/04/22 0335  NA 130* 130* 132* 132* 130* 131* 131*  K 5.4* 5.3* 5.6* 5.5* 5.0 4.7 4.0  CL 100 101 102 101 100 99 99  CO2 20* 19* '23 22 22 '$ 20* 22  GLUCOSE 172* 160* 145* 182* 177* 154* 159*  BUN 116* 118* 122* 124* 128* 130* 100*  CREATININE 4.61* 4.46* 4.48* 4.55* 4.56* 4.88* 3.96*  ALBUMIN 2.3* 2.3* 2.3* 2.3* 2.1* 2.2* 2.3*  CALCIUM 8.0* 8.3* 8.5* 8.4* 8.1* 8.3* 8.2*  PHOS 8.0* 7.6* 7.9* 7.8* 7.6* 7.7* 6.2*   Liver Function Tests: Recent Labs  Lab 01/02/22 0450 01/03/22 0405 01/04/22 0335  ALBUMIN 2.1* 2.2* 2.3*   No results for input(s): "LIPASE", "AMYLASE" in the last 168 hours. No results for input(s): "AMMONIA" in the last 168 hours. CBC: Recent Labs  Lab 12/29/21 0451 12/30/21 0922 01/02/22 0858 01/03/22 0405 01/04/22 0855  WBC 5.8 5.9 7.4 8.6 8.2  HGB 7.5* 8.5* 7.1* 8.4* 8.5*  HCT 22.8* 26.1* 21.7* 24.9* 25.3*  MCV 89.8 90.6 92.3 89.6 91.3  PLT 266 340 285 279 282   Cardiac Enzymes: No results for input(s): "CKTOTAL", "CKMB", "CKMBINDEX", "TROPONINI" in the last 168 hours. CBG: Recent  Labs  Lab 01/02/22 2240 01/03/22 0812 01/03/22 1720 01/03/22 2234 01/04/22 0739  GLUCAP 153* 145* 133* 139* 134*    Iron Studies: No results for input(s): "IRON", "TIBC", "TRANSFERRIN", "FERRITIN" in the last 72 hours. Studies/Results: DG Chest Port 1 View  Result Date: 01/03/2022 CLINICAL DATA:  Status post dialysis catheter placement. EXAM: PORTABLE CHEST 1 VIEW COMPARISON:  December 16, 2021. FINDINGS: Stable cardiomegaly. Interval placement of right internal jugular dialysis catheter with distal tip in expected position of superior vena cava. No pneumothorax is noted. Right lung is clear. Left basilar atelectasis is noted. Bony thorax is unremarkable. IMPRESSION: Interval placement of right internal jugular dialysis catheter with distal tip in expected position of SVC. Left basilar atelectasis is noted. Electronically Signed   By: Marijo Conception M.D.   On: 01/03/2022 14:28   DG C-Arm 1-60 Min-No Report  Result Date: 01/03/2022 Fluoroscopy was utilized by the requesting physician.  No radiographic interpretation.    amLODipine  10 mg Oral Daily   [START ON 01/05/2022] apixaban  2.5 mg Oral BID   atorvastatin  40 mg Oral QHS   carvedilol  3.125 mg Oral BID WC   Chlorhexidine Gluconate Cloth  6 each Topical Daily   darbepoetin (ARANESP) injection - NON-DIALYSIS  200 mcg Subcutaneous Q Thu-1800   feeding supplement  237  mL Oral BID BM   hydrALAZINE  100 mg Oral BID   insulin aspart  0-5 Units Subcutaneous QHS   insulin aspart  0-9 Units Subcutaneous TID WC   polyethylene glycol  17 g Oral Daily   polyvinyl alcohol  1 drop Both Eyes TID   senna  2 tablet Oral Daily    BMET    Component Value Date/Time   NA 131 (L) 01/04/2022 0335   K 4.0 01/04/2022 0335   CL 99 01/04/2022 0335   CO2 22 01/04/2022 0335   GLUCOSE 159 (H) 01/04/2022 0335   BUN 100 (H) 01/04/2022 0335   CREATININE 3.96 (H) 01/04/2022 0335   CALCIUM 8.2 (L) 01/04/2022 0335   GFRNONAA 11 (L) 01/04/2022 0335   GFRAA  28 (L) 10/22/2018 0526   CBC    Component Value Date/Time   WBC 8.2 01/04/2022 0855   RBC 2.77 (L) 01/04/2022 0855   HGB 8.5 (L) 01/04/2022 0855   HCT 25.3 (L) 01/04/2022 0855   PLT 282 01/04/2022 0855   MCV 91.3 01/04/2022 0855   MCH 30.7 01/04/2022 0855   MCHC 33.6 01/04/2022 0855   RDW 13.5 01/04/2022 0855   LYMPHSABS 1.2 12/21/2021 1207   MONOABS 0.2 12/21/2021 1207   EOSABS 0.0 12/21/2021 1207   BASOSABS 0.0 12/21/2021 1207    Assessment/Plan:   AKI/CKD stage IV - Longstanding CKD presumably due to DM and HTN.  Was followed by Dr. Theador Hawthorne but not seen since September 2021.  Scr was 2.63 and 24 hour creatinine clearance 18 mL/min on 12/12/19.  Scr was 2.4-2.65 in 2022 but has been ranging 3.1-3.5 prior to admission when was 4.2.   She would be a marginal candidate given her poor nutritional and functional status but again feel that CKD could be responsible for both.  Different modalities of RRT have already been d/w with Chelsea Meza and her daughter.  Her daughter has decided she wants to proceed-  wants PD but can only do HD while at [facility]. Eliquis on hold.   S/p [facility] placement and first HD on 01/03/22 Will plan for 2nd treatment today and consider 3rd on Thursday or Friday depending upon HD census.   Will also need outpatient HD arrangements. Hyponatremia - felt to be hypervolemic secondary to ESRD + dCHF.  IMproving with UF and HD. Hyperkalemia - Due to #1.  Continue with lokelma  bid.  Lasix on hold for now as above.  Stop Lokelma and continue with HD.  MGUS - SPEP/UPEP pending (has not been checked in over 2 years). Anemia of CKD stage IV - Hgb down to 8.6.  iron stores low-  will give iron and ESA-  and transfuse for Hgb <7.  Likely contributing to her FTT as well  Atrial fibrillation - rate controlled, per primary Chronic diastolic CHF - ECHO on 2/58/52 concerning for cardiac amyloid.  Has never seen a Cardiologist. Still overloaded DM type 2 - presented with hypoglycemia.  Plan  per primary svc Disposition - came from SNF and not strong enough to go home.  She is a marginal dialysis candidate as above.  But was doing well 2 mos ago-  Volume overload and anemia are likely contributing to her FTT and wont significantly improve UNLESS we start HD.but one never knows how it is going to go  Poor performance status and advanced age.  Donetta Potts, MD [facility]

## 2022-01-05 ENCOUNTER — Inpatient Hospital Stay (HOSPITAL_COMMUNITY): Payer: Medicare Other

## 2022-01-05 DIAGNOSIS — N179 Acute kidney failure, unspecified: Secondary | ICD-10-CM | POA: Diagnosis not present

## 2022-01-05 DIAGNOSIS — N184 Chronic kidney disease, stage 4 (severe): Secondary | ICD-10-CM | POA: Diagnosis not present

## 2022-01-05 LAB — GLUCOSE, CAPILLARY
Glucose-Capillary: 183 mg/dL — ABNORMAL HIGH (ref 70–99)
Glucose-Capillary: 146 mg/dL — ABNORMAL HIGH (ref 70–99)
Glucose-Capillary: 317 mg/dL — ABNORMAL HIGH (ref 70–99)
Glucose-Capillary: 247 mg/dL — ABNORMAL HIGH (ref 70–99)
Glucose-Capillary: 213 mg/dL — ABNORMAL HIGH (ref 70–99)

## 2022-01-05 LAB — RENAL FUNCTION PANEL
Albumin: 2.1 g/dL — ABNORMAL LOW (ref 3.5–5.0)
Anion gap: 10 (ref 5–15)
BUN: 74 mg/dL — ABNORMAL HIGH (ref 8–23)
CO2: 24 mmol/L (ref 22–32)
Calcium: 7.8 mg/dL — ABNORMAL LOW (ref 8.9–10.3)
Chloride: 99 mmol/L (ref 98–111)
Creatinine, Ser: 3.35 mg/dL — ABNORMAL HIGH (ref 0.44–1.00)
GFR, Estimated: 13 mL/min — ABNORMAL LOW (ref >60)
Glucose, Bld: 180 mg/dL — ABNORMAL HIGH (ref 70–99)
Phosphorus: 5.6 mg/dL — ABNORMAL HIGH (ref 2.5–4.6)
Potassium: 3.4 mmol/L — ABNORMAL LOW (ref 3.5–5.1)
Sodium: 133 mmol/L — ABNORMAL LOW (ref 135–145)

## 2022-01-05 MED ORDER — POTASSIUM CHLORIDE CRYS ER 20 MEQ PO TBCR
40.0000 meq | EXTENDED_RELEASE_TABLET | Freq: Once | ORAL | Status: AC
  Administered 2022-01-05: 40 meq via ORAL
  Filled 2022-01-05: qty 2

## 2022-01-05 NOTE — Progress Notes (Signed)
PROGRESS NOTE  Chelsea Meza XBD:[id number] DOB: 1935/10/13 DOA: 12/21/2021 PCP: Hal Morales, DO  Brief History:  86 year old female with a history of hypertension, hyperlipidemia, diabetes mellitus type 2, CKD stage IV, CHF, atrial fibrillation, hyperlipidemia presenting from [facility] secondary to altered mental status.  The patient was recently hospitalized from 12/16/2021 to 12/17/2021 secondary to acute on chronic renal failure.  She was discharged to [facility] at that time.  She states that she has been asking for bedtime snack around 8 PM the last 2 evenings, but she has not received them.  The patient's daughter called her earlier in the morning on 12/21/2021.  Apparently the patient was confused.  CBG was ultimately checked and she was noted to have a sugar of 12.  The patient was given glucagon by nursing home staff.  Glucose improved to 36.  Upon EMS arrival, her sugar improved to 73.  Nevertheless, the patient was brought to the emergency department for further evaluation and treatment.  The patient denies any fevers, chills, chest pain, shortness breath, cough, hemoptysis, nausea, vomiting, direct abdominal pain, dysuria, hematuria.  She states that her appetite has been pretty good. In the ED, the patient was afebrile and hemodynamically stable with oxygen saturation 100% room air.  BMP showed sodium 134, potassium 4.3, bicarbonate 22, glucose 64, serum creatinine 4.19.  LFTs were unremarkable.  WBC 4.7, hemoglobin 10.8, platelets 234,000.  EKG showed atrial fibrillation with no ST-T wave changes.  The patient was not actually noted to be hypothermic with a temperature 92.7.  She was placed on a warming blanket. Prolonged hospitalization due to slow renal recovery.  Nephrology consulted and following     Assessment and Plan: * CKD V- Prior to admission at baseline patient has CKD 4  -Renal status worsened in the setting of  volume depletion and hemodynamic  changes Baseline creatinine 3.0-3.3 prior to admission -Despite IV fluids and interventions renal function did not recover -Renal consult appreciated -General surgery placed HD catheter on 01/03/2022 --Hemodialysis on 01/03/2022 and 01/04/2022 -Plan is for hemodialysis here in the hospital on 01/06/2022 and then discharged home for outpatient hemodialysis -She has been set up with outpatient dialysis center for Monday, Wednesday, Friday   Paroxysmal A-fib ([place]) Rate controlled Restart apixaban at discharge Continue coreg for rate control  Essential hypertension Continue amlodipine, carvedilol, hydralazine  Hyperkalemia Resolved with Lokelma and hemodialysis sessions  Hypothermia Presented with temperature 92.7 cortisol level--16.6 12/16/2021 TSH 6.574, free T4--1.22 PCT--0.50 Blood culture x2 sets--neg to date Suspect hypothermia-related to hypoglycemia, now resolved  Chronic diastolic CHF (congestive heart failure) ([place]) 10/27/2021 echo EF 60 to 65%, no WMA, trivial MR Further management of volume status to be done through dialysis  Hypoglycemia associated with type 2 diabetes mellitus ([place]) Suspect variable oral intake in the setting of long-acting Lantus and short acting insulin with worsen renal funciton Received Humalog 6 units 3 times daily at SNF Holding Lantus NovoLog sliding scale--sensitive scale 10/26/2021 hemoglobin A1c 6.7 No further hypoglycemic episodes  Hyperlipidemia Continue statin  Anemia of chronic kidney disease -She has been receiving intravenous iron and ESA -She received 1 unit of PRBC for hemoglobin of 7.1 -Follow-up hemoglobin improved   Goals of care -palliative care following -patient now DNR  Disposition--- anticipate discharge home on 01/06/2022 after hemodialysis patient will have outpatient hemodialysis on Monday Wednesday Friday schedule postdischarge   Family Communication:   Daughter previously updated   Consultants:  nephrology,  palliative care, general surgery   Code Status:  DNR    DVT Prophylaxis: SCDs     Procedures: As Listed in Progress Note Above -Hemodialysis on 01/03/2022 and 01/04/2022   Antibiotics: None    Subjective: -Resting comfortably -Oral intake is fair No fever  Or chills   Objective: Vitals:   01/04/22 2005 01/05/22 0752 01/05/22 1516 01/05/22 1618  BP: (!) 178/96 (!) 163/95 (!) 154/94 131/70  Pulse: 84 74 87 82  Resp: '16 16 18   '$ Temp: 98.3 F (36.8 C) 98.3 F (36.8 C) 98.2 F (36.8 C)   TempSrc: Oral Oral Oral   SpO2: 99% 99% 100%   Weight:      Height:        Intake/Output Summary (Last 24 hours) at 01/05/2022 1858 Last data filed at 01/05/2022 1700 Gross per 24 hour  Intake 970 ml  Output 700 ml  Net 270 ml    Weight change:  Exam:   Physical Exam  Gen:- Awake Alert, in no acute distress  HEENT:- [place].AT, No sclera icterus Neck-Supple Neck, HD catheter Lungs-  CTAB , fair air movement bilaterally  CV- S1, S2 normal, RRR Abd-  +ve B.Sounds, Abd Soft, No tenderness,    Extremity/Skin:- No  edema,   good pedal pulses  Psych-affect is appropriate, oriented x3 Neuro-no new focal deficits, no tremors   Data Reviewed: I have personally reviewed following labs and imaging studies Basic Metabolic Panel: Recent Labs  Lab 01/01/22 0447 01/02/22 0450 01/03/22 0405 01/04/22 0335 01/05/22 0347  NA 132* 130* 131* 131* 133*  K 5.5* 5.0 4.7 4.0 3.4*  CL 101 100 99 99 99  CO2 22 22 20* 22 24  GLUCOSE 182* 177* 154* 159* 180*  BUN 124* 128* 130* 100* 74*  CREATININE 4.55* 4.56* 4.88* 3.96* 3.35*  CALCIUM 8.4* 8.1* 8.3* 8.2* 7.8*  PHOS 7.8* 7.6* 7.7* 6.2* 5.6*   Liver Function Tests: Recent Labs  Lab 01/01/22 0447 01/02/22 0450 01/03/22 0405 01/04/22 0335 01/05/22 0347  ALBUMIN 2.3* 2.1* 2.2* 2.3* 2.1*   No results for input(s): "LIPASE", "AMYLASE" in the last 168 hours. No results for input(s): "AMMONIA" in the last 168 hours. Coagulation Profile: No  results for input(s): "INR", "PROTIME" in the last 168 hours. CBC: Recent Labs  Lab 12/30/21 0922 01/02/22 0858 01/03/22 0405 01/04/22 0855  WBC 5.9 7.4 8.6 8.2  HGB 8.5* 7.1* 8.4* 8.5*  HCT 26.1* 21.7* 24.9* 25.3*  MCV 90.6 92.3 89.6 91.3  PLT 340 285 279 282   Cardiac Enzymes: No results for input(s): "CKTOTAL", "CKMB", "CKMBINDEX", "TROPONINI" in the last 168 hours. BNP: Invalid input(s): "POCBNP" CBG: Recent Labs  Lab 01/04/22 1109 01/04/22 2038 01/05/22 0750 01/05/22 1113 01/05/22 1606  GLUCAP 183* 173* 146* 317* 247*   HbA1C: No results for input(s): "HGBA1C" in the last 72 hours. Urine analysis:    Component Value Date/Time   COLORURINE YELLOW 12/27/2021 0810   APPEARANCEUR CLEAR 12/27/2021 0810   LABSPEC 1.010 12/27/2021 0810   PHURINE 5.0 12/27/2021 0810   GLUCOSEU NEGATIVE 12/27/2021 0810   HGBUR NEGATIVE 12/27/2021 0810   BILIRUBINUR NEGATIVE 12/27/2021 0810   KETONESUR NEGATIVE 12/27/2021 0810   PROTEINUR 100 (A) 12/27/2021 0810   UROBILINOGEN 0.2 07/13/2012 1236   NITRITE NEGATIVE 12/27/2021 0810   LEUKOCYTESUR NEGATIVE 12/27/2021 0810   Sepsis Labs: '@LABRCNTIP'$ (procalcitonin:4,lacticidven:4) ) No results found for this or any previous visit (from the past 240 hour(s)).    Scheduled Meds:  amLODipine  10 mg Oral  Daily   apixaban  2.5 mg Oral BID   atorvastatin  40 mg Oral QHS   carvedilol  3.125 mg Oral BID WC   Chlorhexidine Gluconate Cloth  6 each Topical Daily   darbepoetin (ARANESP) injection - NON-DIALYSIS  200 mcg Subcutaneous Q Wed-1800   feeding supplement  237 mL Oral BID BM   hydrALAZINE  100 mg Oral BID   insulin aspart  0-5 Units Subcutaneous QHS   insulin aspart  0-9 Units Subcutaneous TID WC   polyethylene glycol  17 g Oral Daily   polyvinyl alcohol  1 drop Both Eyes TID   senna  2 tablet Oral Daily   Continuous Infusions:  iron sucrose 100 mg (01/04/22 1615)     Procedures/Studies: DG Chest 2 View  Result Date:  01/05/2022 CLINICAL DATA:  Shortness of breath and weakness. EXAM: CHEST - 2 VIEW COMPARISON:  Radiograph 01/03/2021 FINDINGS: Stable positioning of right internal jugular dialysis catheter. Stable cardiomegaly. Unchanged mediastinal contours. Retrocardiac opacity appears similar. There may be small bilateral pleural effusions. No pulmonary edema. No pneumothorax. IMPRESSION: 1. Unchanged cardiomegaly. 2. Retrocardiac opacity may represent pneumonia, atelectasis or partial lobar collapse. 3. Possible small pleural effusions. Electronically Signed   By: Keith Rake M.D.   On: 01/05/2022 16:05   DG Chest Port 1 View  Result Date: 01/03/2022 CLINICAL DATA:  Status post dialysis catheter placement. EXAM: PORTABLE CHEST 1 VIEW COMPARISON:  December 16, 2021. FINDINGS: Stable cardiomegaly. Interval placement of right internal jugular dialysis catheter with distal tip in expected position of superior vena cava. No pneumothorax is noted. Right lung is clear. Left basilar atelectasis is noted. Bony thorax is unremarkable. IMPRESSION: Interval placement of right internal jugular dialysis catheter with distal tip in expected position of SVC. Left basilar atelectasis is noted. Electronically Signed   By: Marijo Conception M.D.   On: 01/03/2022 14:28   DG C-Arm 1-60 Min-No Report  Result Date: 01/03/2022 Fluoroscopy was utilized by the requesting physician.  No radiographic interpretation.   US RENAL  Result Date: 12/27/2021 CLINICAL DATA:  Acute kidney insufficiency on admission. EXAM: RENAL / URINARY TRACT ULTRASOUND COMPLETE COMPARISON:  Renal ultrasound 04/05/2020, CT abdomen and pelvis 02/21/2021 FINDINGS: Right Kidney: Renal measurements: 9.7 x 4.7 x 5.4 cm = volume: 129 mL. Increased cortical echogenicity as can be seen with medical renal disease, similar to prior. Lateral right midpole hypoechoic cyst measuring up to 1.1 cm, benign. No suspicious right renal mass is seen. Left Kidney: Limited evaluation of the  left kidney due to overlying bowel gas. Renal measurements: Maximally 9.0 x 4.5 x 3.2 cm = volume: Approximately 67 mL. There again appears to be increased echogenicity of the left renal cortex as can be seen with medical renal disease. No gross cyst or solid mass is seen. Bladder: Only mildly distended, limiting evaluation. Other: None. IMPRESSION: 1. Evaluation of the left kidney is limited by overlying bowel gas. No hydronephrosis is seen either side. 2. There is again increased cortical echogenicity as can be seen with medical renal disease. Electronically Signed   By: Yvonne Kendall M.D.   On: 12/27/2021 10:47   DG Chest Portable 1 View  Result Date: 12/16/2021 CLINICAL DATA:  Difficult the rising to standing position. EXAM: PORTABLE CHEST 1 VIEW COMPARISON:  11/10/2021 FINDINGS: Lungs are hypoinflated and otherwise clear. Mild stable cardiomegaly. Remainder of the exam is unchanged. IMPRESSION: Hypoinflation without acute cardiopulmonary disease. Electronically Signed   By: Marin Olp M.D.   On: 12/16/2021  13:15    Roxan Hockey, MD  [facility]  If 7PM-7AM, please contact night-coverage [email or web address] 01/05/2022, 6:58 PM   LOS: 15 days

## 2022-01-05 NOTE — Progress Notes (Signed)
New Dialysis Start   Patient identified as new dialysis start. Kidney Education packet assembled and given. Discussed the following items with patient:    Current medications and possible changes once started:  Discussed that patient's medications may change over time.  Ex; hypertension medications and diabetes medication.  Nephrologists will adjust as needed.  Fluid restrictions reviewed:  32 oz daily goal:  All liquids count; soups, ice, jello   Phosphorus and potassium: Handout given showing high potassium and phosphorus foods.  Alternative food and drink options given.  Family support:  spoke with daughter, [name], via phone  Outpatient Clinic Resources:  Discussed roles of Outpatient clinic  staff and advised to make a list of needs, if any, to talk with outpatient staff if needed  Care plan schedule: Informed patient of Care Plans in outpatient setting and to participate in the care plan.  An invitation would be given from outpatient clinic.   Dialysis Access Options:  Reviewed access options with patients. Discussed in detail about care at home with new AVG & AVF. Reviewed checking bruit and thrill. If dialysis catheter present, educated that patient could not take showers.  Catheter dressing changes were to be done by outpatient clinic staff only  Home therapy options:  Educated patient about home therapy options:  PD vs home hemo.     Patient verbalized understanding. Will continue to round on patient during admission.    Lilia Argue, RN

## 2022-01-05 NOTE — Progress Notes (Signed)
Patient ID: [name], female   DOB: 1935/11/10, 86 y.o.   MRN: [id number] S: Tolerated HD well yesterday.  No new complaints. Reports hungers, eating breakfast. She reports that her swelling seems to be improving. Net UF ~2L O:BP (!) 163/95 (BP Location: Left Arm)   Pulse 74   Temp 98.3 F (36.8 C) (Oral)   Resp 16   Ht '5\' 10"'$  (1.778 m)   Wt 92.8 kg   SpO2 99%   BMI 29.36 kg/m   Intake/Output Summary (Last 24 hours) at 01/05/2022 0930 Last data filed at 01/05/2022 0500 Gross per 24 hour  Intake 535 ml  Output 2750 ml  Net -2215 ml   Intake/Output: I/O last 3 completed shifts: In: 109 [P.O.:570; IV Piggyback:205] Out: 3235 [Urine:1250; Other:3000]  Intake/Output this shift:  No intake/output data recorded. Weight change:  Gen:NAD, sitting up in bed CVS: RRR, no rub Resp: CTA Abd: +BS, soft, NT/ND Ext: trace to 1+ pretibial edema bilaterally Neuro: awake, alert  Recent Labs  Lab 12/30/21 0402 12/31/21 0500 01/01/22 0447 01/02/22 0450 01/03/22 0405 01/04/22 0335 01/05/22 0347  NA 130* 132* 132* 130* 131* 131* 133*  K 5.3* 5.6* 5.5* 5.0 4.7 4.0 3.4*  CL 101 102 101 100 99 99 99  CO2 19* '23 22 22 '$ 20* 22 24  GLUCOSE 160* 145* 182* 177* 154* 159* 180*  BUN 118* 122* 124* 128* 130* 100* 74*  CREATININE 4.46* 4.48* 4.55* 4.56* 4.88* 3.96* 3.35*  ALBUMIN 2.3* 2.3* 2.3* 2.1* 2.2* 2.3* 2.1*  CALCIUM 8.3* 8.5* 8.4* 8.1* 8.3* 8.2* 7.8*  PHOS 7.6* 7.9* 7.8* 7.6* 7.7* 6.2* 5.6*   Liver Function Tests: Recent Labs  Lab 01/03/22 0405 01/04/22 0335 01/05/22 0347  ALBUMIN 2.2* 2.3* 2.1*   No results for input(s): "LIPASE", "AMYLASE" in the last 168 hours. No results for input(s): "AMMONIA" in the last 168 hours. CBC: Recent Labs  Lab 12/30/21 0922 01/02/22 0858 01/03/22 0405 01/04/22 0855  WBC 5.9 7.4 8.6 8.2  HGB 8.5* 7.1* 8.4* 8.5*  HCT 26.1* 21.7* 24.9* 25.3*  MCV 90.6 92.3 89.6 91.3  PLT 340 285 279 282   Cardiac Enzymes: No results for input(s):  "CKTOTAL", "CKMB", "CKMBINDEX", "TROPONINI" in the last 168 hours. CBG: Recent Labs  Lab 01/03/22 1720 01/03/22 2234 01/04/22 0739 01/04/22 2038 01/05/22 0750  GLUCAP 133* 139* 134* 173* 146*    Iron Studies: No results for input(s): "IRON", "TIBC", "TRANSFERRIN", "FERRITIN" in the last 72 hours. Studies/Results: DG Chest Port 1 View  Result Date: 01/03/2022 CLINICAL DATA:  Status post dialysis catheter placement. EXAM: PORTABLE CHEST 1 VIEW COMPARISON:  December 16, 2021. FINDINGS: Stable cardiomegaly. Interval placement of right internal jugular dialysis catheter with distal tip in expected position of superior vena cava. No pneumothorax is noted. Right lung is clear. Left basilar atelectasis is noted. Bony thorax is unremarkable. IMPRESSION: Interval placement of right internal jugular dialysis catheter with distal tip in expected position of SVC. Left basilar atelectasis is noted. Electronically Signed   By: Marijo Conception M.D.   On: 01/03/2022 14:28   DG C-Arm 1-60 Min-No Report  Result Date: 01/03/2022 Fluoroscopy was utilized by the requesting physician.  No radiographic interpretation.     amLODipine  10 mg Oral Daily   apixaban  2.5 mg Oral BID   atorvastatin  40 mg Oral QHS   carvedilol  3.125 mg Oral BID WC   Chlorhexidine Gluconate Cloth  6 each Topical Daily   darbepoetin (ARANESP) injection -  NON-DIALYSIS  200 mcg Subcutaneous Q Wed-1800   feeding supplement  237 mL Oral BID BM   hydrALAZINE  100 mg Oral BID   insulin aspart  0-5 Units Subcutaneous QHS   insulin aspart  0-9 Units Subcutaneous TID WC   polyethylene glycol  17 g Oral Daily   polyvinyl alcohol  1 drop Both Eyes TID   senna  2 tablet Oral Daily    BMET    Component Value Date/Time   NA 133 (L) 01/05/2022 0347   K 3.4 (L) 01/05/2022 0347   CL 99 01/05/2022 0347   CO2 24 01/05/2022 0347   GLUCOSE 180 (H) 01/05/2022 0347   BUN 74 (H) 01/05/2022 0347   CREATININE 3.35 (H) 01/05/2022 0347   CALCIUM  7.8 (L) 01/05/2022 0347   GFRNONAA 13 (L) 01/05/2022 0347   GFRAA 28 (L) 10/22/2018 0526   CBC    Component Value Date/Time   WBC 8.2 01/04/2022 0855   RBC 2.77 (L) 01/04/2022 0855   HGB 8.5 (L) 01/04/2022 0855   HCT 25.3 (L) 01/04/2022 0855   PLT 282 01/04/2022 0855   MCV 91.3 01/04/2022 0855   MCH 30.7 01/04/2022 0855   MCHC 33.6 01/04/2022 0855   RDW 13.5 01/04/2022 0855   LYMPHSABS 1.2 12/21/2021 1207   MONOABS 0.2 12/21/2021 1207   EOSABS 0.0 12/21/2021 1207   BASOSABS 0.0 12/21/2021 1207    Assessment/Plan:   AKI/CKD stage IV - Longstanding CKD presumably due to DM and HTN.  Was followed by Dr. Theador Hawthorne but not seen since September 2021.  Scr was 2.63 and 24 hour creatinine clearance 18 mL/min on 12/12/19.  Scr was 2.4-2.65 in 2022 but has been ranging 3.1-3.5 prior to admission when was 4.2.   She would be a marginal candidate given her poor nutritional and functional status but again feel that CKD could be responsible for both.  Different modalities of RRT have already been d/w with Mrs. Orban and her daughter.  Her daughter has decided she wants to proceed-  wants PD but can only do HD while at [facility]. Eliquis on hold.   S/p TDC placement and first HD on 01/03/22, #2 on 9/6. #3 treatment planned from 9/8 Will also need outpatient HD arrangements. Hyponatremia - felt to be hypervolemic secondary to ESRD + dCHF.  IMproving with UF and HD. Hyperkalemia - Due to #1.  Continue with lokelma  bid.  Lasix on hold for now as above.  Stop Lokelma and continue with HD. now hypokalemic, will adjust bath for 9/8 MGUS - SPEP/UPEP pending (has not been checked in over 2 years). Anemia of CKD stage IV - Hgb down to 8.6.  iron stores low-  giving iron and ESA-  and transfuse for Hgb <7.  Likely contributing to her FTT as well  Atrial fibrillation - rate controlled, per primary Chronic diastolic CHF - ECHO on 2/45/80 concerning for cardiac amyloid.  Has never seen a Cardiologist. Vol status seems to  be improving DM type 2 - presented with hypoglycemia.  Plan per primary svc Disposition - came from SNF and not strong enough to go home.  She is a marginal dialysis candidate as above.  But was doing well 2 mos ago-  Volume overload and anemia are likely contributing to her FTT and wont significantly improve UNLESS we start HD.but one never knows how it is going to go  Poor performance status and advanced age.  Gean Quint, MD [facility]

## 2022-01-05 NOTE — Progress Notes (Signed)
Occupational Therapy Treatment Patient Details Name: Chelsea Meza MRN: [id number] DOB: 10/01/1935 Today's Date: 01/05/2022   History of present illness Chelsea Meza is a 86 year old female with a history of hypertension, hyperlipidemia, diabetes mellitus type 2, CKD stage IV, CHF, atrial fibrillation, hyperlipidemia presenting from [facility] secondary to altered mental status.  The patient was recently hospitalized from 12/16/2021 to 12/17/2021 secondary to acute on chronic renal failure.  She was discharged to [facility] at that time.  She states that she has been asking for bedtime snack around 8 PM the last 2 evenings, but she has not received them.  The patient's daughter called her earlier in the morning on 12/21/2021.  Apparently the patient was confused.  CBG was ultimately checked and she was noted to have a sugar of 12.  The patient was given glucagon by nursing home staff.  Glucose improved to 36.  Upon EMS arrival, her sugar improved to 73.  Nevertheless, the patient was brought to the emergency department for further evaluation and treatment.  The patient denies any fevers, chills, chest pain, shortness breath, cough, hemoptysis, nausea, vomiting, direct abdominal pain, dysuria, hematuria.  She states that her appetite has been pretty good.  In the ED, the patient was afebrile and hemodynamically stable with oxygen saturation 100% room air.  BMP showed sodium 134, potassium 4.3, bicarbonate 22, glucose 64, serum creatinine 4.19.  LFTs were unremarkable.  WBC 4.7, hemoglobin 10.8, platelets 234,000.  EKG showed atrial fibrillation with no ST-T wave changes.  The patient was not actually noted to be hypothermic with a temperature 92.7.  She was placed on a warming blanket.   OT comments  Pt agreeable to OT treatment. Pt demonstrates improved endurance by willingness to transfer to chair today after bed mobility. Pt required mod to max A to boost from EOB with RW. Pt was able to pivot  to chair with more min to mod A once standing with RW. Pt completed B UE strengthening needing assist for shoulder flexion and reduction of reps for flexion and abduction due to fatigue. Pt declined to work on sit to stand to day and opted for B UE strengthening instead. Pt was left in chair with call bell and phone within reach. Pt will benefit from continued OT in the hospital and recommended venue below to increase strength, balance, and endurance for safe ADL's.       Recommendations for follow up therapy are one component of a multi-disciplinary discharge planning process, led by the attending physician.  Recommendations may be updated based on patient status, additional functional criteria and insurance authorization.    Follow Up Recommendations  Skilled nursing-short term rehab (<3 hours/day)    Assistance Recommended at Discharge Intermittent Supervision/Assistance  Patient can return home with the following  A lot of help with walking and/or transfers;A lot of help with bathing/dressing/bathroom;Assistance with cooking/housework;Assist for transportation;Help with stairs or ramp for entrance   Equipment Recommendations  None recommended by OT    Recommendations for Other Services      Precautions / Restrictions Precautions Precautions: Fall Restrictions Weight Bearing Restrictions: No       Mobility Bed Mobility Overal bed mobility: Needs Assistance Bed Mobility: Supine to Sit     Supine to sit: HOB elevated, Supervision     General bed mobility comments: Labored movement with supervision assist.    Transfers Overall transfer level: Needs assistance Equipment used: Rolling walker (2 wheels) Transfers: Sit to/from Stand, Bed to chair/wheelchair/BSC Sit to  Stand: Mod assist, Max assist     Step pivot transfers: Min assist, Mod assist     General transfer comment: Pt required mod to max A to boost from EOB with more min to mod A to step pivot to chair with  RW.     Balance Overall balance assessment: Needs assistance Sitting-balance support: Feet supported, No upper extremity supported Sitting balance-Leahy Scale: Fair Sitting balance - Comments: fair to good seated at EOB   Standing balance support: During functional activity, Bilateral upper extremity supported, Reliant on assistive device for balance Standing balance-Leahy Scale: Poor Standing balance comment: fair/poor using RW                             Cognition Arousal/Alertness: Awake/alert Behavior During Therapy: WFL for tasks assessed/performed Overall Cognitive Status: Within Functional Limits for tasks assessed                                          Exercises Exercises: General Upper Extremity General Exercises - Upper Extremity Shoulder Flexion: AAROM, 5 reps, Seated Shoulder ABduction: AROM, Both, Seated, 5 reps Shoulder Horizontal ABduction: AROM, Both, 10 reps, Seated (x10 protraction seated)            Pertinent Vitals/ Pain       Pain Assessment Pain Assessment: No/denies pain                                                          Frequency  Min 2X/week        Progress Toward Goals  OT Goals(current goals can now be found in the care plan section)  Progress towards OT goals: Progressing toward goals  Acute Rehab OT Goals Patient Stated Goal: open to rehab to get stronger OT Goal Formulation: With patient Time For Goal Achievement: 01/13/22 Potential to Achieve Goals: Good ADL Goals Pt Will Perform Grooming: with supervision;standing Pt Will Perform Lower Body Bathing: with modified independence;sitting/lateral leans;with adaptive equipment Pt Will Perform Lower Body Dressing: with modified independence;sitting/lateral leans;with adaptive equipment Pt Will Transfer to Toilet: with min guard assist;with min assist;stand pivot transfer Pt/caregiver will Perform Home Exercise Program:  Increased strength;Both right and left upper extremity;Independently  Plan Discharge plan remains appropriate                                    End of Session Equipment Utilized During Treatment: Rolling walker (2 wheels)  OT Visit Diagnosis: Unsteadiness on feet (R26.81);Other abnormalities of gait and mobility (R26.89);Muscle weakness (generalized) (M62.81)   Activity Tolerance Patient tolerated treatment well   Patient Left in chair;with call bell/phone within reach;with nursing/sitter in room   Nurse Communication Other (comment) (Nursing present intermittently during session.)        Time: 8921-1941 OT Time Calculation (min): 14 min  Charges: OT General Charges $OT Visit: 1 Visit OT Treatments $Therapeutic Exercise: 8-22 mins  Briann Sarchet OT, MOT  Larey Seat 01/05/2022, 9:35 AM

## 2022-01-05 NOTE — TOC Progression Note (Signed)
Transition of Care [facility]) - Progression Note    Patient Details  Name: Chelsea Meza MRN: [id number] Date of Birth: January 11, 1936  Transition of [facility]) CM/SW Contact  Boneta Lucks, RN Phone Number: 01/05/2022, 3:20 PM  Clinical Narrative:   Patient and family has decided they want to go home with Home health. MD will order. [facility] is accepting. TOC faxed dialysis flowsheets to [facility], they also requested chest xray for TB clearance. MD ordered. TOC will send DC summary, planning to DC after HD tomorrow. Theressa Stamps will confirm actual chair time when they receive all the documents. TOC to follow.    Expected Discharge Plan: [place] Barriers to Discharge: Continued Medical Work up  Expected Discharge Plan and Services Expected Discharge Plan: [place]: PT [place]: [place] Date [place]: 01/05/22 Time [place]: 1000 Representative spoke with at [place]: Marjory Lies  Readmission Risk Interventions    01/05/2022    3:18 PM 12/22/2021    2:27 PM 02/24/2021   12:38 PM  Readmission Risk Prevention Plan  Transportation Screening Complete Complete Complete  PCP or Specialist Appt within 3-5 Days  Complete   HRI or Home Care Consult  Complete Patient refused  Social Work Consult for [place] Planning/Counseling  Complete Complete  Palliative Care Screening  Not Applicable Not Applicable  Medication Review Press photographer) Complete Complete Complete  PCP or Specialist appointment within 3-5 days of discharge Not Complete    HRI or [place] Complete    SW Recovery Care/Counseling Consult Complete    Palliative Care Screening Not [place] Patient Refused

## 2022-01-06 DIAGNOSIS — N184 Chronic kidney disease, stage 4 (severe): Secondary | ICD-10-CM | POA: Diagnosis not present

## 2022-01-06 DIAGNOSIS — N179 Acute kidney failure, unspecified: Secondary | ICD-10-CM | POA: Diagnosis not present

## 2022-01-06 LAB — TYPE AND SCREEN
ABO/RH(D): O POS
Antibody Screen: NEGATIVE
Unit division: 0
Unit division: 0

## 2022-01-06 LAB — RENAL FUNCTION PANEL
Albumin: 2.1 g/dL — ABNORMAL LOW (ref 3.5–5.0)
Anion gap: 9 (ref 5–15)
BUN: 78 mg/dL — ABNORMAL HIGH (ref 8–23)
CO2: 24 mmol/L (ref 22–32)
Calcium: 8 mg/dL — ABNORMAL LOW (ref 8.9–10.3)
Chloride: 101 mmol/L (ref 98–111)
Creatinine, Ser: 3.55 mg/dL — ABNORMAL HIGH (ref 0.44–1.00)
GFR, Estimated: 12 mL/min — ABNORMAL LOW (ref >60)
Glucose, Bld: 189 mg/dL — ABNORMAL HIGH (ref 70–99)
Phosphorus: 5.8 mg/dL — ABNORMAL HIGH (ref 2.5–4.6)
Potassium: 3.8 mmol/L (ref 3.5–5.1)
Sodium: 134 mmol/L — ABNORMAL LOW (ref 135–145)

## 2022-01-06 LAB — BPAM RBC
Blood Product Expiration Date: 202309272359
Blood Product Expiration Date: 202310032359
ISSUE DATE / TIME: 202309041341
Unit Type and Rh: 5100
Unit Type and Rh: 5100

## 2022-01-06 LAB — GLUCOSE, CAPILLARY
Glucose-Capillary: 174 mg/dL — ABNORMAL HIGH (ref 70–99)
Glucose-Capillary: 129 mg/dL — ABNORMAL HIGH (ref 70–99)
Glucose-Capillary: 233 mg/dL — ABNORMAL HIGH (ref 70–99)

## 2022-01-06 NOTE — Procedures (Signed)
Patient alert and oriented, brought to unit in bed.  Informed consent signed and in chart.   Treatment initiated: 1737 Treatment completed: 1113  Patient tolerated well. Transported back to unit. Patient is alert and not in acute distress.  Hand-off given to patient's nurse, Caryl Asp, LPN.  Access used: Temp Cath  Access issues: No issues   Total UF removed: 2.5 L Medication(s) given: No medications given   Post HD VS: Temperature: 98 Blood Pressure: 163/74 (101) Heart Rate: 77 Rhythm Afib Respiratory Rate: 17 Oxygen Saturation: 100 RA  Post HD weight: 94 kg  Morton Peters, RN, BSN [facility]

## 2022-01-06 NOTE — Progress Notes (Signed)
Patient ID: Chelsea Meza, female   DOB: [birth year], 86 y.o.   MRN: [id number] S: Patient seen and examined on dialysis. Open for d/c to home with home health. Accepted for [facility] [place] per staff on MWF schedule, due to start this coming Monday. Otherwise, she is tolerating HD today, VSS. UFG 2500. O:BP (!) 162/82   Pulse 78   Temp 97.8 F (36.6 C) (Oral)   Resp 16   Ht '5\' 10"'$  (1.778 m)   Wt 96.8 kg   SpO2 100%   BMI 30.62 kg/m   Intake/Output Summary (Last 24 hours) at 01/06/2022 1044 Last data filed at 01/06/2022 0900 Gross per 24 hour  Intake 480 ml  Output 500 ml  Net -20 ml   Intake/Output: I/O last 3 completed shifts: In: 970 [P.O.:870; IV Piggyback:100] Out: 800 [Urine:800]  Intake/Output this shift:  No intake/output data recorded. Weight change:  Gen:NAD, laying flat in bed CVS: RRR, no rub Resp: CTA Abd: +BS, soft, NT/ND Ext: trace pretibial edema bilaterally Neuro: awake, alert  Recent Labs  Lab 12/31/21 0500 01/01/22 0447 01/02/22 0450 01/03/22 0405 01/04/22 0335 01/05/22 0347 01/06/22 0339  NA 132* 132* 130* 131* 131* 133* 134*  K 5.6* 5.5* 5.0 4.7 4.0 3.4* 3.8  CL 102 101 100 99 99 99 101  CO2 '23 22 22 '$ 20* '22 24 24  '$ GLUCOSE 145* 182* 177* 154* 159* 180* 189*  BUN 122* 124* 128* 130* 100* 74* 78*  CREATININE 4.48* 4.55* 4.56* 4.88* 3.96* 3.35* 3.55*  ALBUMIN 2.3* 2.3* 2.1* 2.2* 2.3* 2.1* 2.1*  CALCIUM 8.5* 8.4* 8.1* 8.3* 8.2* 7.8* 8.0*  PHOS 7.9* 7.8* 7.6* 7.7* 6.2* 5.6* 5.8*   Liver Function Tests: Recent Labs  Lab 01/04/22 0335 01/05/22 0347 01/06/22 0339  ALBUMIN 2.3* 2.1* 2.1*   No results for input(s): "LIPASE", "AMYLASE" in the last 168 hours. No results for input(s): "AMMONIA" in the last 168 hours. CBC: Recent Labs  Lab 01/02/22 0858 01/03/22 0405 01/04/22 0855  WBC 7.4 8.6 8.2  HGB 7.1* 8.4* 8.5*  HCT 21.7* 24.9* 25.3*  MCV 92.3 89.6 91.3  PLT 285 279 282   Cardiac Enzymes: No results for input(s): "CKTOTAL",  "CKMB", "CKMBINDEX", "TROPONINI" in the last 168 hours. CBG: Recent Labs  Lab 01/05/22 0750 01/05/22 1113 01/05/22 1606 01/05/22 2102 01/06/22 0707  GLUCAP 146* 317* 247* 213* 174*    Iron Studies: No results for input(s): "IRON", "TIBC", "TRANSFERRIN", "FERRITIN" in the last 72 hours. Studies/Results: DG Chest 2 View  Result Date: 01/05/2022 CLINICAL DATA:  Shortness of breath and weakness. EXAM: CHEST - 2 VIEW COMPARISON:  Radiograph 01/03/2021 FINDINGS: Stable positioning of right internal jugular dialysis catheter. Stable cardiomegaly. Unchanged mediastinal contours. Retrocardiac opacity appears similar. There may be small bilateral pleural effusions. No pulmonary edema. No pneumothorax. IMPRESSION: 1. Unchanged cardiomegaly. 2. Retrocardiac opacity may represent pneumonia, atelectasis or partial lobar collapse. 3. Possible small pleural effusions. Electronically Signed   By: Keith Rake M.D.   On: 01/05/2022 16:05     amLODipine  10 mg Oral Daily   apixaban  2.5 mg Oral BID   atorvastatin  40 mg Oral QHS   carvedilol  3.125 mg Oral BID WC   Chlorhexidine Gluconate Cloth  6 each Topical Daily   darbepoetin (ARANESP) injection - NON-DIALYSIS  200 mcg Subcutaneous Q Wed-1800   feeding supplement  237 mL Oral BID BM   hydrALAZINE  100 mg Oral BID   insulin aspart  0-5 Units Subcutaneous QHS  insulin aspart  0-9 Units Subcutaneous TID WC   polyethylene glycol  17 g Oral Daily   polyvinyl alcohol  1 drop Both Eyes TID   senna  2 tablet Oral Daily    BMET    Component Value Date/Time   NA 134 (L) 01/06/2022 0339   K 3.8 01/06/2022 0339   CL 101 01/06/2022 0339   CO2 24 01/06/2022 0339   GLUCOSE 189 (H) 01/06/2022 0339   BUN 78 (H) 01/06/2022 0339   CREATININE 3.55 (H) 01/06/2022 0339   CALCIUM 8.0 (L) 01/06/2022 0339   GFRNONAA 12 (L) 01/06/2022 0339   GFRAA 28 (L) 10/22/2018 0526   CBC    Component Value Date/Time   WBC 8.2 01/04/2022 0855   RBC 2.77 (L)  01/04/2022 0855   HGB 8.5 (L) 01/04/2022 0855   HCT 25.3 (L) 01/04/2022 0855   PLT 282 01/04/2022 0855   MCV 91.3 01/04/2022 0855   MCH 30.7 01/04/2022 0855   MCHC 33.6 01/04/2022 0855   RDW 13.5 01/04/2022 0855   LYMPHSABS 1.2 12/21/2021 1207   MONOABS 0.2 12/21/2021 1207   EOSABS 0.0 12/21/2021 1207   BASOSABS 0.0 12/21/2021 1207    Assessment/Plan:   AKI/CKD stage IV - Longstanding CKD presumably due to DM and HTN.  Was followed by Dr. Theador Hawthorne but not seen since September 2021.  Scr was 2.63 and 24 hour creatinine clearance 18 mL/min on 12/12/19.  Scr was 2.4-2.65 in 2022 but has been ranging 3.1-3.5 prior to admission when was 4.2.   She would be a marginal candidate given her poor nutritional and functional status but again feel that CKD could be responsible for both.  Different modalities of RRT have already been d/w with [name].  Her daughter has decided she wants to proceed-  wants PD but can only do HD while at [facility]. Eliquis on hold.   S/p TDC placement and first HD on 01/03/22, #2 on 9/6. #3 treatment today Accepted to [facility] MWF Hyponatremia - felt to be hypervolemic secondary to ESRD + dCHF.  IMproving with UF and HD. Hyperkalemia - Due to #1.  Continue with lokelma  bid.  Lasix on hold for now as above.  Stop Lokelma and continue with HD. now hypokalemic, adjusted bath  to 3k for 9/8 MGUS - SPEP/UPEP pending (has not been checked in over 2 years). Anemia of CKD stage IV - Hgb down to 8.6.  iron stores low-  giving iron and ESA-  and transfuse for Hgb <7.  Likely contributing to her FTT as well  Atrial fibrillation - rate controlled, per primary Chronic diastolic CHF - ECHO on 0/22/33 concerning for cardiac amyloid.  Has never seen a Cardiologist. Vol status seems to be improving DM type 2 - presented with hypoglycemia.  Plan per primary svc Disposition - per primary, to be discharged after HD today  Gean Quint, MD [facility]

## 2022-01-06 NOTE — Care Management Important Message (Signed)
Important Message  Patient Details  Name: Chelsea Meza MRN: [id number] Date of Birth: Jun 21, 1935   Medicare Important Message Given:  Yes (copy left on bedside table)     Tommy Medal 01/06/2022, 11:46 AM

## 2022-01-06 NOTE — Progress Notes (Signed)
PROGRESS NOTE  Chelsea Meza VHQ:[id number] DOB: 14-Dec-1935 DOA: 12/21/2021 PCP: Hal Morales, DO  Brief History:  86 year old female with a history of hypertension, hyperlipidemia, diabetes mellitus type 2, CKD stage IV, CHF, atrial fibrillation, hyperlipidemia presenting from [facility] secondary to altered mental status.  The patient was recently hospitalized from 12/16/2021 to 12/17/2021 secondary to acute on chronic renal failure.  She was discharged to [facility] at that time.  She states that she has been asking for bedtime snack around 8 PM the last 2 evenings, but she has not received them.  The patient's daughter called her earlier in the morning on 12/21/2021.  Apparently the patient was confused.  CBG was ultimately checked and she was noted to have a sugar of 12.  The patient was given glucagon by nursing home staff.  Glucose improved to 36.  Upon EMS arrival, her sugar improved to 73.  Nevertheless, the patient was brought to the emergency department for further evaluation and treatment.  The patient denies any fevers, chills, chest pain, shortness breath, cough, hemoptysis, nausea, vomiting, direct abdominal pain, dysuria, hematuria.  She states that her appetite has been pretty good. In the ED, the patient was afebrile and hemodynamically stable with oxygen saturation 100% room air.  BMP showed sodium 134, potassium 4.3, bicarbonate 22, glucose 64, serum creatinine 4.19.  LFTs were unremarkable.  WBC 4.7, hemoglobin 10.8, platelets 234,000.  EKG showed atrial fibrillation with no ST-T wave changes.  The patient was not actually noted to be hypothermic with a temperature 92.7.  She was placed on a warming blanket. Prolonged hospitalization due to slow renal recovery.  Nephrology consulted and following     Assessment and Plan: * CKD V- Prior to admission at baseline patient has CKD 4  -Renal status worsened in the setting of  volume depletion and hemodynamic  changes Baseline creatinine 3.0-3.3 prior to admission -Despite IV fluids and interventions renal function did not recover -Renal consult appreciated -General surgery placed HD catheter on 01/03/2022 --Hemodialysis on 01/03/2022 and 01/04/2022 and 01/06/22 -She has been set up with outpatient dialysis center for Monday, Wednesday, Friday -SNF facility is unable to accept patient until Monday, 01/09/2022   Paroxysmal A-fib ([place]) Rate controlled Restart apixaban at discharge Continue coreg for rate control  Essential hypertension Continue amlodipine, carvedilol, hydralazine  Hyperkalemia Resolved with Lokelma and hemodialysis sessions  Hypothermia Presented with temperature 92.7 cortisol level--16.6 12/16/2021 TSH 6.574, free T4--1.22 PCT--0.50 Blood culture x2 sets--neg to date Suspect hypothermia-related to hypoglycemia, now resolved  Chronic diastolic CHF (congestive heart failure) ([place]) 10/27/2021 echo EF 60 to 65%, no WMA, trivial MR Further management of volume status to be done through dialysis  Hypoglycemia associated with type 2 diabetes mellitus ([name]) Suspect variable oral intake in the setting of long-acting Lantus and short acting insulin with worsen renal funciton Received Humalog 6 units 3 times daily at SNF Holding Lantus NovoLog sliding scale--sensitive scale 10/26/2021 hemoglobin A1c 6.7 No further hypoglycemic episodes  Hyperlipidemia Continue statin  Anemia of chronic kidney disease -She has been receiving intravenous iron and ESA -She received 1 unit of PRBC for hemoglobin of 7.1 -Follow-up hemoglobin improved   Goals of care -palliative care following -patient now DNR  Disposition---She has been set up with outpatient dialysis center for Monday, Wednesday, Friday -SNF facility is unable to accept patient until Monday, 01/09/2022   Family Communication:   Daughter previously updated   Consultants:  nephrology, palliative  care, general surgery   Code  Status:  DNR    DVT Prophylaxis: SCDs     Procedures: As Listed in Progress Note Above -Hemodialysis on 01/03/2022 and 01/04/2022   Antibiotics: None    Subjective: -No new concerns, tolerated hemodialysis well,- -still voiding some   Objective: Vitals:   01/06/22 1100 01/06/22 1113 01/06/22 1128 01/06/22 1332  BP: (!) 164/81 (!) 163/74  (!) 164/77  Pulse: 75 77    Resp: '16 17  18  '$ Temp:  98 F (36.7 C)  98.2 F (36.8 C)  TempSrc:  Oral  Oral  SpO2: 100% 100%  100%  Weight:   94 kg   Height:        Intake/Output Summary (Last 24 hours) at 01/06/2022 1848 Last data filed at 01/06/2022 1300 Gross per 24 hour  Intake 240 ml  Output 102.5 ml  Net 137.5 ml    Weight change:  Exam:   Physical Exam  Gen:- Awake Alert, in no acute distress  HEENT:- [place].AT, No sclera icterus Neck-Supple Neck, HD catheter Lungs-  CTAB , fair air movement bilaterally  CV- S1, S2 normal, RRR Abd-  +ve B.Sounds, Abd Soft, No tenderness,    Extremity/Skin:- No  edema,   good pedal pulses  Psych-affect is appropriate, oriented x3 Neuro-generalized weakness, no new focal deficits, no tremors   Data Reviewed: I have personally reviewed following labs and imaging studies Basic Metabolic Panel: Recent Labs  Lab 01/02/22 0450 01/03/22 0405 01/04/22 0335 01/05/22 0347 01/06/22 0339  NA 130* 131* 131* 133* 134*  K 5.0 4.7 4.0 3.4* 3.8  CL 100 99 99 99 101  CO2 22 20* '22 24 24  '$ GLUCOSE 177* 154* 159* 180* 189*  BUN 128* 130* 100* 74* 78*  CREATININE 4.56* 4.88* 3.96* 3.35* 3.55*  CALCIUM 8.1* 8.3* 8.2* 7.8* 8.0*  PHOS 7.6* 7.7* 6.2* 5.6* 5.8*   Liver Function Tests: Recent Labs  Lab 01/02/22 0450 01/03/22 0405 01/04/22 0335 01/05/22 0347 01/06/22 0339  ALBUMIN 2.1* 2.2* 2.3* 2.1* 2.1*   CBC: Recent Labs  Lab 01/02/22 0858 01/03/22 0405 01/04/22 0855  WBC 7.4 8.6 8.2  HGB 7.1* 8.4* 8.5*  HCT 21.7* 24.9* 25.3*  MCV 92.3 89.6 91.3  PLT 285 279 282   Cardiac  Enzymes: No results for input(s): "CKTOTAL", "CKMB", "CKMBINDEX", "TROPONINI" in the last 168 hours. BNP: Invalid input(s): "POCBNP" CBG: Recent Labs  Lab 01/05/22 1606 01/05/22 2102 01/06/22 0707 01/06/22 1135 01/06/22 1602  GLUCAP 247* 213* 174* 129* 233*   HbA1C: No results for input(s): "HGBA1C" in the last 72 hours. Urine analysis:    Component Value Date/Time   COLORURINE YELLOW 12/27/2021 0810   APPEARANCEUR CLEAR 12/27/2021 0810   LABSPEC 1.010 12/27/2021 0810   PHURINE 5.0 12/27/2021 0810   GLUCOSEU NEGATIVE 12/27/2021 0810   HGBUR NEGATIVE 12/27/2021 0810   BILIRUBINUR NEGATIVE 12/27/2021 0810   KETONESUR NEGATIVE 12/27/2021 0810   PROTEINUR 100 (A) 12/27/2021 0810   UROBILINOGEN 0.2 07/13/2012 1236   NITRITE NEGATIVE 12/27/2021 0810   LEUKOCYTESUR NEGATIVE 12/27/2021 0810   Sepsis Labs: '@LABRCNTIP'$ (procalcitonin:4,lacticidven:4) ) No results found for this or any previous visit (from the past 240 hour(s)).    Scheduled Meds:  amLODipine  10 mg Oral Daily   apixaban  2.5 mg Oral BID   atorvastatin  40 mg Oral QHS   carvedilol  3.125 mg Oral BID WC   Chlorhexidine Gluconate Cloth  6 each Topical Daily   darbepoetin (ARANESP) injection - NON-DIALYSIS  200 mcg Subcutaneous Q Wed-1800   feeding supplement  237 mL Oral BID BM   hydrALAZINE  100 mg Oral BID   insulin aspart  0-5 Units Subcutaneous QHS   insulin aspart  0-9 Units Subcutaneous TID WC   polyethylene glycol  17 g Oral Daily   polyvinyl alcohol  1 drop Both Eyes TID   senna  2 tablet Oral Daily   Continuous Infusions:  iron sucrose 100 mg (01/06/22 1148)     Procedures/Studies: DG Chest 2 View  Result Date: 01/05/2022 CLINICAL DATA:  Shortness of breath and weakness. EXAM: CHEST - 2 VIEW COMPARISON:  Radiograph 01/03/2021 FINDINGS: Stable positioning of right internal jugular dialysis catheter. Stable cardiomegaly. Unchanged mediastinal contours. Retrocardiac opacity appears similar. There  may be small bilateral pleural effusions. No pulmonary edema. No pneumothorax. IMPRESSION: 1. Unchanged cardiomegaly. 2. Retrocardiac opacity may represent pneumonia, atelectasis or partial lobar collapse. 3. Possible small pleural effusions. Electronically Signed   By: Keith Rake M.D.   On: 01/05/2022 16:05   DG Chest Port 1 View  Result Date: 01/03/2022 CLINICAL DATA:  Status post dialysis catheter placement. EXAM: PORTABLE CHEST 1 VIEW COMPARISON:  December 16, 2021. FINDINGS: Stable cardiomegaly. Interval placement of right internal jugular dialysis catheter with distal tip in expected position of superior vena cava. No pneumothorax is noted. Right lung is clear. Left basilar atelectasis is noted. Bony thorax is unremarkable. IMPRESSION: Interval placement of right internal jugular dialysis catheter with distal tip in expected position of SVC. Left basilar atelectasis is noted. Electronically Signed   By: Marijo Conception M.D.   On: 01/03/2022 14:28   DG C-Arm 1-60 Min-No Report  Result Date: 01/03/2022 Fluoroscopy was utilized by the requesting physician.  No radiographic interpretation.   US RENAL  Result Date: 12/27/2021 CLINICAL DATA:  Acute kidney insufficiency on admission. EXAM: RENAL / URINARY TRACT ULTRASOUND COMPLETE COMPARISON:  Renal ultrasound 04/05/2020, CT abdomen and pelvis 02/21/2021 FINDINGS: Right Kidney: Renal measurements: 9.7 x 4.7 x 5.4 cm = volume: 129 mL. Increased cortical echogenicity as can be seen with medical renal disease, similar to prior. Lateral right midpole hypoechoic cyst measuring up to 1.1 cm, benign. No suspicious right renal mass is seen. Left Kidney: Limited evaluation of the left kidney due to overlying bowel gas. Renal measurements: Maximally 9.0 x 4.5 x 3.2 cm = volume: Approximately 67 mL. There again appears to be increased echogenicity of the left renal cortex as can be seen with medical renal disease. No gross cyst or solid mass is seen. Bladder: Only  mildly distended, limiting evaluation. Other: None. IMPRESSION: 1. Evaluation of the left kidney is limited by overlying bowel gas. No hydronephrosis is seen either side. 2. There is again increased cortical echogenicity as can be seen with medical renal disease. Electronically Signed   By: Yvonne Kendall M.D.   On: 12/27/2021 10:47   DG Chest Portable 1 View  Result Date: 12/16/2021 CLINICAL DATA:  Difficult the rising to standing position. EXAM: PORTABLE CHEST 1 VIEW COMPARISON:  11/10/2021 FINDINGS: Lungs are hypoinflated and otherwise clear. Mild stable cardiomegaly. Remainder of the exam is unchanged. IMPRESSION: Hypoinflation without acute cardiopulmonary disease. Electronically Signed   By: Marin Olp M.D.   On: 12/16/2021 13:15    Montrey Buist Denton Brick, MD  [facility]  If 7PM-7AM, please contact night-coverage [email or web address] 01/06/2022, 6:48 PM   LOS: 16 days

## 2022-01-06 NOTE — Progress Notes (Signed)
Nutrition Education Note  RD consulted for Renal Education.   Provided "My Kidney Diet" and "Sodium and Kidney disease" handouts to patient.   RN provided handouts related to high phosphorus and potassium foods.   Discussed importance of protein intake at each meal and snack. Provided examples of how to maximize protein intake throughout the day. Strongly encouraged compliance of this diet.   RN discussed need for fluid restriction with dialysis (32 oz fluid goal), importance of minimizing weight gain between HD treatments, and renal-friendly beverage options.  Encouraged pt to discuss specific diet questions/concerns with RD at HD outpatient facility. Teach back method used.  Additional protein sources handout attached to AVS  Expect good compliance.  Body mass index is 30.62 kg/m. Pt meets criteria for obese based on current BMI.   Colman Cater MS,RD,CSG,LDN Contact: Shea Evans

## 2022-01-06 NOTE — Care Management Important Message (Signed)
Important Message  Patient Details  Name: Chelsea Meza MRN: [id number] Date of Birth: 05-Dec-1935   Medicare Important Message Given:  Yes     Tommy Medal 01/06/2022, 11:31 AM

## 2022-01-07 DIAGNOSIS — N184 Chronic kidney disease, stage 4 (severe): Secondary | ICD-10-CM | POA: Diagnosis not present

## 2022-01-07 DIAGNOSIS — N179 Acute kidney failure, unspecified: Secondary | ICD-10-CM | POA: Diagnosis not present

## 2022-01-07 LAB — GLUCOSE, CAPILLARY
Glucose-Capillary: 178 mg/dL — ABNORMAL HIGH (ref 70–99)
Glucose-Capillary: 161 mg/dL — ABNORMAL HIGH (ref 70–99)
Glucose-Capillary: 166 mg/dL — ABNORMAL HIGH (ref 70–99)
Glucose-Capillary: 149 mg/dL — ABNORMAL HIGH (ref 70–99)
Glucose-Capillary: 153 mg/dL — ABNORMAL HIGH (ref 70–99)

## 2022-01-07 NOTE — Progress Notes (Signed)
Per tech BS is 166 2 units given.

## 2022-01-07 NOTE — Progress Notes (Signed)
PROGRESS NOTE  Chelsea Meza FTD:[id number] DOB: Sep 01, 1935 DOA: 12/21/2021 PCP: Hal Morales, DO  Brief History:  86 year old female with a history of hypertension, hyperlipidemia, diabetes mellitus type 2, CKD stage IV, CHF, atrial fibrillation, hyperlipidemia presenting from [facility] secondary to altered mental status.  The patient was recently hospitalized from 12/16/2021 to 12/17/2021 secondary to acute on chronic renal failure.  She was discharged to [facility] at that time.  She states that she has been asking for bedtime snack around 8 PM the last 2 evenings, but she has not received them.  The patient's daughter called her earlier in the morning on 12/21/2021.  Apparently the patient was confused.  CBG was ultimately checked and she was noted to have a sugar of 12.  The patient was given glucagon by nursing home staff.  Glucose improved to 36.  Upon EMS arrival, her sugar improved to 73.  Nevertheless, the patient was brought to the emergency department for further evaluation and treatment.  The patient denies any fevers, chills, chest pain, shortness breath, cough, hemoptysis, nausea, vomiting, direct abdominal pain, dysuria, hematuria.  She states that her appetite has been pretty good. In the ED, the patient was afebrile and hemodynamically stable with oxygen saturation 100% room air.  BMP showed sodium 134, potassium 4.3, bicarbonate 22, glucose 64, serum creatinine 4.19.  LFTs were unremarkable.  WBC 4.7, hemoglobin 10.8, platelets 234,000.  EKG showed atrial fibrillation with no ST-T wave changes.  The patient was not actually noted to be hypothermic with a temperature 92.7.  She was placed on a warming blanket. Prolonged hospitalization due to slow renal recovery.  Nephrology consulted and following   Assessment and Plan: 1)CKD V- Prior to admission at baseline patient has CKD 4  -Renal status worsened in the setting of  volume depletion and hemodynamic  changes Baseline creatinine 3.0-3.3 prior to admission -Despite IV fluids and interventions renal function did not recover -Renal consult appreciated -General surgery placed HD catheter on 01/03/2022 --Hemodialysis started on 01/03/2022 and 01/04/2022 and 01/06/22 -She has been set up with outpatient dialysis center for Monday, Wednesday, Friday 01/07/22 -Patient remains medically stable for discharge, okay to transfer SNF facility when bed is available  Paroxysmal A-fib ([place]) Rate controlled Restart apixaban at discharge Continue coreg for rate control  Essential hypertension Continue amlodipine, carvedilol, hydralazine  Hyperkalemia Resolved with Lokelma and hemodialysis sessions  Hypothermia Presented with temperature 92.7 cortisol level--16.6 12/16/2021 TSH 6.574, free T4--1.22 PCT--0.50 Blood culture x2 sets--neg to date Suspect hypothermia-related to hypoglycemia, now resolved  Chronic diastolic CHF (congestive heart failure) ([place]) 10/27/2021 echo EF 60 to 65%, no WMA, trivial MR Further management of volume status to be done through dialysis  Hypoglycemia associated with type 2 diabetes mellitus ([place]) Suspect variable oral intake in the setting of long-acting Lantus and short acting insulin with worsen renal funciton Received Humalog 6 units 3 times daily at SNF Holding Lantus NovoLog sliding scale--sensitive scale 10/26/2021 hemoglobin A1c 6.7 No further hypoglycemic episodes  Hyperlipidemia Continue statin  Anemia of chronic kidney disease -She has been receiving intravenous iron and ESA -She received 1 unit of PRBC for hemoglobin of 7.1 -Follow-up hemoglobin improved   Goals of care -palliative care following -patient now DNR  Disposition---She has been set up with outpatient dialysis center for Monday, Wednesday, Friday -01/07/22 -Patient remains medically stable for discharge, okay to transfer SNF facility when bed is available   Family Communication:  Daughter  previously updated   Consultants:  nephrology, palliative care, general surgery   Code Status:  DNR    DVT Prophylaxis: SCDs     Procedures: As Listed in Progress Note Above -Hemodialysis on 01/03/2022, 01/04/2022 and 01/06/22   Antibiotics: None    Subjective: -Oral intake is fair No fever  Or chills   No Nausea, Vomiting or Diarrhea 01/07/22 -Patient remains medically stable for discharge, okay to transfer SNF facility when bed is available   Objective: Vitals:   01/06/22 1128 01/06/22 1332 01/06/22 2221 01/07/22 0445  BP:  (!) 164/77 (!) 142/71 (!) 145/78  Pulse:   89 78  Resp:  '18 18 16  '$ Temp:  98.2 F (36.8 C) 98.7 F (37.1 C) 98.1 F (36.7 C)  TempSrc:  Oral Oral Oral  SpO2:  100% 99% 99%  Weight: 94 kg     Height:        Intake/Output Summary (Last 24 hours) at 01/07/2022 1155 Last data filed at 01/07/2022 0900 Gross per 24 hour  Intake 480 ml  Output 500 ml  Net -20 ml    Weight change:  Exam:  Physical Exam  Gen:- Awake Alert, in no acute distress  HEENT:- [place].AT, No sclera icterus Neck-Supple Neck, Rt IJ HD catheter Lungs-  CTAB , fair air movement bilaterally  CV- S1, S2 normal, RRR Abd-  +ve B.Sounds, Abd Soft, No tenderness,    Extremity/Skin:- No  edema,   good pedal pulses  Psych-affect is appropriate, oriented x3 Neuro-Generalized weakness, no new focal deficits, no tremors   Data Reviewed: I have personally reviewed following labs and imaging studies Basic Metabolic Panel: Recent Labs  Lab 01/02/22 0450 01/03/22 0405 01/04/22 0335 01/05/22 0347 01/06/22 0339  NA 130* 131* 131* 133* 134*  K 5.0 4.7 4.0 3.4* 3.8  CL 100 99 99 99 101  CO2 22 20* '22 24 24  '$ GLUCOSE 177* 154* 159* 180* 189*  BUN 128* 130* 100* 74* 78*  CREATININE 4.56* 4.88* 3.96* 3.35* 3.55*  CALCIUM 8.1* 8.3* 8.2* 7.8* 8.0*  PHOS 7.6* 7.7* 6.2* 5.6* 5.8*   Liver Function Tests: Recent Labs  Lab 01/02/22 0450 01/03/22 0405 01/04/22 0335 01/05/22 0347  01/06/22 0339  ALBUMIN 2.1* 2.2* 2.3* 2.1* 2.1*   CBC: Recent Labs  Lab 01/02/22 0858 01/03/22 0405 01/04/22 0855  WBC 7.4 8.6 8.2  HGB 7.1* 8.4* 8.5*  HCT 21.7* 24.9* 25.3*  MCV 92.3 89.6 91.3  PLT 285 279 282   CBG: Recent Labs  Lab 01/05/22 2102 01/06/22 0707 01/06/22 1135 01/06/22 1602 01/07/22 0745  GLUCAP 213* 174* 129* 233* 161*   HbA1C: No results for input(s): "HGBA1C" in the last 72 hours. Urine analysis:    Component Value Date/Time   COLORURINE YELLOW 12/27/2021 0810   APPEARANCEUR CLEAR 12/27/2021 0810   LABSPEC 1.010 12/27/2021 0810   PHURINE 5.0 12/27/2021 0810   GLUCOSEU NEGATIVE 12/27/2021 0810   HGBUR NEGATIVE 12/27/2021 0810   BILIRUBINUR NEGATIVE 12/27/2021 0810   KETONESUR NEGATIVE 12/27/2021 0810   PROTEINUR 100 (A) 12/27/2021 0810   UROBILINOGEN 0.2 07/13/2012 1236   NITRITE NEGATIVE 12/27/2021 0810   LEUKOCYTESUR NEGATIVE 12/27/2021 0810   Scheduled Meds:  amLODipine  10 mg Oral Daily   apixaban  2.5 mg Oral BID   atorvastatin  40 mg Oral QHS   carvedilol  3.125 mg Oral BID WC   Chlorhexidine Gluconate Cloth  6 each Topical Daily   darbepoetin (ARANESP) injection - NON-DIALYSIS  200 mcg  Subcutaneous Q Wed-1800   feeding supplement  237 mL Oral BID BM   hydrALAZINE  100 mg Oral BID   insulin aspart  0-5 Units Subcutaneous QHS   insulin aspart  0-9 Units Subcutaneous TID WC   polyethylene glycol  17 g Oral Daily   polyvinyl alcohol  1 drop Both Eyes TID   senna  2 tablet Oral Daily   Continuous Infusions:  iron sucrose 100 mg (01/06/22 1148)   Procedures/Studies: DG Chest 2 View  Result Date: 01/05/2022 CLINICAL DATA:  Shortness of breath and weakness. EXAM: CHEST - 2 VIEW COMPARISON:  Radiograph 01/03/2021 FINDINGS: Stable positioning of right internal jugular dialysis catheter. Stable cardiomegaly. Unchanged mediastinal contours. Retrocardiac opacity appears similar. There may be small bilateral pleural effusions. No pulmonary  edema. No pneumothorax. IMPRESSION: 1. Unchanged cardiomegaly. 2. Retrocardiac opacity may represent pneumonia, atelectasis or partial lobar collapse. 3. Possible small pleural effusions. Electronically Signed   By: Keith Rake M.D.   On: 01/05/2022 16:05   DG Chest Port 1 View  Result Date: 01/03/2022 CLINICAL DATA:  Status post dialysis catheter placement. EXAM: PORTABLE CHEST 1 VIEW COMPARISON:  December 16, 2021. FINDINGS: Stable cardiomegaly. Interval placement of right internal jugular dialysis catheter with distal tip in expected position of superior vena cava. No pneumothorax is noted. Right lung is clear. Left basilar atelectasis is noted. Bony thorax is unremarkable. IMPRESSION: Interval placement of right internal jugular dialysis catheter with distal tip in expected position of SVC. Left basilar atelectasis is noted. Electronically Signed   By: Marijo Conception M.D.   On: 01/03/2022 14:28   DG C-Arm 1-60 Min-No Report  Result Date: 01/03/2022 Fluoroscopy was utilized by the requesting physician.  No radiographic interpretation.   US RENAL  Result Date: 12/27/2021 CLINICAL DATA:  Acute kidney insufficiency on admission. EXAM: RENAL / URINARY TRACT ULTRASOUND COMPLETE COMPARISON:  Renal ultrasound 04/05/2020, CT abdomen and pelvis 02/21/2021 FINDINGS: Right Kidney: Renal measurements: 9.7 x 4.7 x 5.4 cm = volume: 129 mL. Increased cortical echogenicity as can be seen with medical renal disease, similar to prior. Lateral right midpole hypoechoic cyst measuring up to 1.1 cm, benign. No suspicious right renal mass is seen. Left Kidney: Limited evaluation of the left kidney due to overlying bowel gas. Renal measurements: Maximally 9.0 x 4.5 x 3.2 cm = volume: Approximately 67 mL. There again appears to be increased echogenicity of the left renal cortex as can be seen with medical renal disease. No gross cyst or solid mass is seen. Bladder: Only mildly distended, limiting evaluation. Other: None.  IMPRESSION: 1. Evaluation of the left kidney is limited by overlying bowel gas. No hydronephrosis is seen either side. 2. There is again increased cortical echogenicity as can be seen with medical renal disease. Electronically Signed   By: Yvonne Kendall M.D.   On: 12/27/2021 10:47   DG Chest Portable 1 View  Result Date: 12/16/2021 CLINICAL DATA:  Difficult the rising to standing position. EXAM: PORTABLE CHEST 1 VIEW COMPARISON:  11/10/2021 FINDINGS: Lungs are hypoinflated and otherwise clear. Mild stable cardiomegaly. Remainder of the exam is unchanged. IMPRESSION: Hypoinflation without acute cardiopulmonary disease. Electronically Signed   By: Marin Olp M.D.   On: 12/16/2021 13:15    Aymar Whitfill Denton Brick, MD  [facility]  If 7PM-7AM, please contact night-coverage [email or web address] 01/07/2022, 11:55 AM   LOS: 17 days

## 2022-01-08 DIAGNOSIS — Z7901 Long term (current) use of anticoagulants: Secondary | ICD-10-CM | POA: Diagnosis not present

## 2022-01-08 DIAGNOSIS — I110 Hypertensive heart disease with heart failure: Secondary | ICD-10-CM | POA: Diagnosis not present

## 2022-01-08 DIAGNOSIS — I509 Heart failure, unspecified: Secondary | ICD-10-CM | POA: Diagnosis not present

## 2022-01-08 DIAGNOSIS — I82401 Acute embolism and thrombosis of unspecified deep veins of right lower extremity: Secondary | ICD-10-CM | POA: Diagnosis not present

## 2022-01-08 DIAGNOSIS — I10 Essential (primary) hypertension: Secondary | ICD-10-CM | POA: Diagnosis not present

## 2022-01-08 DIAGNOSIS — Z13220 Encounter for screening for lipoid disorders: Secondary | ICD-10-CM | POA: Diagnosis not present

## 2022-01-08 DIAGNOSIS — Z79899 Other long term (current) drug therapy: Secondary | ICD-10-CM | POA: Diagnosis not present

## 2022-01-08 DIAGNOSIS — N189 Chronic kidney disease, unspecified: Secondary | ICD-10-CM | POA: Diagnosis not present

## 2022-01-08 DIAGNOSIS — E875 Hyperkalemia: Secondary | ICD-10-CM | POA: Diagnosis not present

## 2022-01-08 DIAGNOSIS — R2689 Other abnormalities of gait and mobility: Secondary | ICD-10-CM | POA: Diagnosis not present

## 2022-01-08 DIAGNOSIS — N184 Chronic kidney disease, stage 4 (severe): Secondary | ICD-10-CM | POA: Diagnosis not present

## 2022-01-08 DIAGNOSIS — M79673 Pain in unspecified foot: Secondary | ICD-10-CM | POA: Diagnosis not present

## 2022-01-08 DIAGNOSIS — E119 Type 2 diabetes mellitus without complications: Secondary | ICD-10-CM | POA: Diagnosis not present

## 2022-01-08 DIAGNOSIS — L0231 Cutaneous abscess of buttock: Secondary | ICD-10-CM | POA: Diagnosis not present

## 2022-01-08 DIAGNOSIS — Z743 Need for continuous supervision: Secondary | ICD-10-CM | POA: Diagnosis not present

## 2022-01-08 DIAGNOSIS — R52 Pain, unspecified: Secondary | ICD-10-CM | POA: Diagnosis not present

## 2022-01-08 DIAGNOSIS — E1121 Type 2 diabetes mellitus with diabetic nephropathy: Secondary | ICD-10-CM | POA: Diagnosis not present

## 2022-01-08 DIAGNOSIS — Z794 Long term (current) use of insulin: Secondary | ICD-10-CM | POA: Diagnosis not present

## 2022-01-08 DIAGNOSIS — I4819 Other persistent atrial fibrillation: Secondary | ICD-10-CM | POA: Diagnosis not present

## 2022-01-08 DIAGNOSIS — I82C11 Acute embolism and thrombosis of right internal jugular vein: Secondary | ICD-10-CM | POA: Diagnosis not present

## 2022-01-08 DIAGNOSIS — I82409 Acute embolism and thrombosis of unspecified deep veins of unspecified lower extremity: Secondary | ICD-10-CM | POA: Diagnosis not present

## 2022-01-08 DIAGNOSIS — E1165 Type 2 diabetes mellitus with hyperglycemia: Secondary | ICD-10-CM | POA: Diagnosis not present

## 2022-01-08 DIAGNOSIS — R5381 Other malaise: Secondary | ICD-10-CM | POA: Diagnosis not present

## 2022-01-08 DIAGNOSIS — M792 Neuralgia and neuritis, unspecified: Secondary | ICD-10-CM | POA: Diagnosis not present

## 2022-01-08 DIAGNOSIS — N186 End stage renal disease: Secondary | ICD-10-CM | POA: Diagnosis not present

## 2022-01-08 DIAGNOSIS — R262 Difficulty in walking, not elsewhere classified: Secondary | ICD-10-CM | POA: Diagnosis not present

## 2022-01-08 DIAGNOSIS — M7989 Other specified soft tissue disorders: Secondary | ICD-10-CM | POA: Diagnosis present

## 2022-01-08 DIAGNOSIS — Z515 Encounter for palliative care: Secondary | ICD-10-CM | POA: Diagnosis not present

## 2022-01-08 DIAGNOSIS — I482 Chronic atrial fibrillation, unspecified: Secondary | ICD-10-CM | POA: Diagnosis not present

## 2022-01-08 DIAGNOSIS — Z8673 Personal history of transient ischemic attack (TIA), and cerebral infarction without residual deficits: Secondary | ICD-10-CM | POA: Diagnosis not present

## 2022-01-08 DIAGNOSIS — I132 Hypertensive heart and chronic kidney disease with heart failure and with stage 5 chronic kidney disease, or end stage renal disease: Secondary | ICD-10-CM | POA: Diagnosis not present

## 2022-01-08 DIAGNOSIS — E785 Hyperlipidemia, unspecified: Secondary | ICD-10-CM | POA: Diagnosis not present

## 2022-01-08 DIAGNOSIS — E162 Hypoglycemia, unspecified: Secondary | ICD-10-CM | POA: Diagnosis not present

## 2022-01-08 DIAGNOSIS — E1122 Type 2 diabetes mellitus with diabetic chronic kidney disease: Secondary | ICD-10-CM | POA: Diagnosis not present

## 2022-01-08 DIAGNOSIS — I4891 Unspecified atrial fibrillation: Secondary | ICD-10-CM | POA: Diagnosis not present

## 2022-01-08 DIAGNOSIS — R6889 Other general symptoms and signs: Secondary | ICD-10-CM | POA: Diagnosis not present

## 2022-01-08 DIAGNOSIS — M6281 Muscle weakness (generalized): Secondary | ICD-10-CM | POA: Diagnosis not present

## 2022-01-08 DIAGNOSIS — Z992 Dependence on renal dialysis: Secondary | ICD-10-CM | POA: Diagnosis not present

## 2022-01-08 DIAGNOSIS — I5032 Chronic diastolic (congestive) heart failure: Secondary | ICD-10-CM | POA: Diagnosis not present

## 2022-01-08 DIAGNOSIS — N179 Acute kidney failure, unspecified: Secondary | ICD-10-CM | POA: Diagnosis not present

## 2022-01-08 DIAGNOSIS — Z23 Encounter for immunization: Secondary | ICD-10-CM | POA: Diagnosis not present

## 2022-01-08 DIAGNOSIS — D649 Anemia, unspecified: Secondary | ICD-10-CM | POA: Diagnosis not present

## 2022-01-08 LAB — GLUCOSE, CAPILLARY
Glucose-Capillary: 147 mg/dL — ABNORMAL HIGH (ref 70–99)
Glucose-Capillary: 216 mg/dL — ABNORMAL HIGH (ref 70–99)

## 2022-01-08 MED ORDER — FUROSEMIDE 40 MG PO TABS: 40.0000 mg | ORAL_TABLET | Freq: Every day | ORAL | 2 refills | Status: AC

## 2022-01-08 MED ORDER — ACETAMINOPHEN 325 MG PO TABS: 650.0000 mg | ORAL_TABLET | Freq: Four times a day (QID) | ORAL | 0 refills | Status: AC | PRN

## 2022-01-08 MED ORDER — DARBEPOETIN ALFA 200 MCG/0.4ML IJ SOSY: 200.0000 ug | PREFILLED_SYRINGE | INTRAMUSCULAR | 3 refills | Status: DC

## 2022-01-08 MED ORDER — INSULIN ASPART 100 UNIT/ML IJ SOLN: INTRAMUSCULAR | 11 refills | Status: DC

## 2022-01-08 MED ORDER — APIXABAN 2.5 MG PO TABS: 2.5000 mg | ORAL_TABLET | Freq: Two times a day (BID) | ORAL | 4 refills | Status: DC

## 2022-01-08 MED ORDER — CARVEDILOL 3.125 MG PO TABS: 3.1250 mg | ORAL_TABLET | Freq: Two times a day (BID) | ORAL | 1 refills | Status: AC

## 2022-01-08 MED ORDER — SODIUM CHLORIDE 0.9 % IV SOLN: 100.0000 mg | INTRAVENOUS | 2 refills | Status: DC

## 2022-01-08 MED ORDER — AMLODIPINE BESYLATE 10 MG PO TABS: 10.0000 mg | ORAL_TABLET | Freq: Every day | ORAL | 1 refills | Status: DC

## 2022-01-08 NOTE — Discharge Instructions (Signed)
1)Very low-salt diet advised 2)Weigh yourself daily, call if you gain more than 3 pounds in 1 day or more than 5 pounds in 1 week as your diuretic medications and your hemodialysis dry weight may need to be adjusted 3)Limit your Fluid  intake to no more than 60 ounces (1.8 Liters) per day 4)Avoid ibuprofen/Advil/Aleve/Motrin/Goody Powders/Naproxen/BC powders/Meloxicam/Diclofenac/Indomethacin and other Nonsteroidal anti-inflammatory medications as these will make you more likely to bleed and can cause stomach ulcers, can also cause Kidney problems.  5) repeat CBC and BMP blood test within the week 6)NovoLog insulin per sliding scale glucose 0-120--no units; 121 to 150-1 unit; 151 to 200-2 units; 201 to 250-3 units; 251 to 300-5 units; 30 to 350-7 units; 351 to 400-9 units

## 2022-01-08 NOTE — Plan of Care (Signed)

## 2022-01-08 NOTE — Progress Notes (Addendum)
TOC CSW has contacted [employer].  Pt can be received today per Noel Christmas, RN.  Call Report #:  [phone]  Room #:  A28  This information has been shared with nurse.   Yerachmiel Spinney Tarpley-Carter, MSW, LCSW-A Pronouns:  She/Her/Hers [facility] HealthTransitions of Care Clinical Social Worker Direct Number:  [phone] [email or web address]

## 2022-01-08 NOTE — Progress Notes (Signed)
TOC CSW contacted [employer] for pt.  ETA of 40 minutes.  This information has been shared with nurse.  Juleon Narang Tarpley-Carter, MSW, LCSW-A Pronouns:  She/Her/Hers [facility] HealthTrans[facility] of Care Clinical Social Worker Direct Number:  [phone] [email or web address]

## 2022-01-08 NOTE — Discharge Summary (Signed)
Chelsea Meza, is a 86 y.o. female  DOB [birth year]  MRN [id number].  Admission date:  12/21/2021  Admitting Physician  Orson Eva, MD  Discharge Date:  01/08/2022   Primary MD  Hal Morales, DO  Recommendations for primary care physician for things to follow:   1)Very low-salt diet advised 2)Weigh yourself daily, call if you gain more than 3 pounds in 1 day or more than 5 pounds in 1 week as your diuretic medications and your hemodialysis dry weight may need to be adjusted 3)Limit your Fluid  intake to no more than 60 ounces (1.8 Liters) per day 4)Avoid ibuprofen/Advil/Aleve/Motrin/Goody Powders/Naproxen/BC powders/Meloxicam/Diclofenac/Indomethacin and other Nonsteroidal anti-inflammatory medications as these will make you more likely to bleed and can cause stomach ulcers, can also cause Kidney problems.  5) repeat CBC and BMP blood test within the week 6)NovoLog insulin per sliding scale glucose 0-120--no units; 121 to 150-1 unit; 151 to 200-2 units; 201 to 250-3 units; 251 to 300-5 units; 30 to 350-7 units; 351 to 400-9 units  Admission Diagnosis  Hypoglycemia [E16.2] Hypothermia, initial encounter [T68.XXXA] Acute renal failure superimposed on stage 4 chronic kidney disease, unspecified acute renal failure type ([place]) [N17.9, N18.4]   Discharge Diagnosis  Hypoglycemia [E16.2] Hypothermia, initial encounter [T68.XXXA] Acute renal failure superimposed on stage 4 chronic kidney disease, unspecified acute renal failure type ([place]) [N17.9, N18.4]    Principal Problem:   Acute renal failure superimposed on stage 4 chronic kidney disease, unspecified acute renal failure type ([place]) Active Problems:   Essential hypertension   A-fib ([name])   Hyperlipidemia   Hypoglycemia associated with type 2 diabetes mellitus ([name])   Chronic diastolic CHF (congestive heart failure) ([name])   Hypothermia    Hyperkalemia      Past Medical History:  Diagnosis Date   Anemia    Chronic atrial fibrillation ([name])    CKD (chronic kidney disease) stage 3, GFR 30-59 ml/min ([name])    Diabetes mellitus without complication ([name])    Hyperlipidemia    Hypertension    Localized edema    Other specified disorders of thyroid    TIA (transient ischemic attack)    Unspecified convulsions ([place])     Past Surgical History:  Procedure Laterality Date   CHOLECYSTECTOMY      HPI  from the history and physical done on the day of admission:   HPI: Chelsea Meza is a 86 year old female with a history of hypertension, hyperlipidemia, diabetes mellitus type 2, CKD stage IV, CHF, atrial fibrillation, hyperlipidemia presenting from [facility] secondary to altered mental status.  The patient was recently hospitalized from 12/16/2021 to 12/17/2021 secondary to acute on chronic renal failure.  She was discharged to [facility] at that time.  She states that she has been asking for bedtime snack around 8 PM the last 2 evenings, but she has not received them.  The patient's daughter called her earlier in the morning on 12/21/2021.  Apparently the patient was confused.  CBG was ultimately checked and she was noted  to have a sugar of 12.  The patient was given glucagon by nursing home staff.  Glucose improved to 36.  Upon EMS arrival, her sugar improved to 73.  Nevertheless, the patient was brought to the emergency department for further evaluation and treatment.  The patient denies any fevers, chills, chest pain, shortness breath, cough, hemoptysis, nausea, vomiting, direct abdominal pain, dysuria, hematuria.  She states that her appetite has been pretty good. In the ED, the patient was afebrile and hemodynamically stable with oxygen saturation 100% room air.  BMP showed sodium 134, potassium 4.3, bicarbonate 22, glucose 64, serum creatinine 4.19.  LFTs were unremarkable.  WBC 4.7, hemoglobin 10.8, platelets 234,000.  EKG  showed atrial fibrillation with no ST-T wave changes.  The patient was not actually noted to be hypothermic with a temperature 92.7.  She was placed on a warming blanket.   Review of Systems: As mentioned in the history of present illness. All other systems reviewed and are negative.   Hospital Course:     86 year old female with a history of hypertension, hyperlipidemia, diabetes mellitus type 2, CKD stage IV, CHF, atrial fibrillation, hyperlipidemia presenting from [facility] secondary to altered mental status.  The patient was recently hospitalized from 12/16/2021 to 12/17/2021 secondary to acute on chronic renal failure.  She was discharged to [facility] at that time.  She states that she has been asking for bedtime snack around 8 PM the last 2 evenings, but she has not received them.  The patient's daughter called her earlier in the morning on 12/21/2021.  Apparently the patient was confused.  CBG was ultimately checked and she was noted to have a sugar of 12.  The patient was given glucagon by nursing home staff.  Glucose improved to 36.  Upon EMS arrival, her sugar improved to 73.  Nevertheless, the patient was brought to the emergency department for further evaluation and treatment.  The patient denies any fevers, chills, chest pain, shortness breath, cough, hemoptysis, nausea, vomiting, direct abdominal pain, dysuria, hematuria.  She states that her appetite has been pretty good. In the ED, the patient was afebrile and hemodynamically stable with oxygen saturation 100% room air.  BMP showed sodium 134, potassium 4.3, bicarbonate 22, glucose 64, serum creatinine 4.19.  LFTs were unremarkable.  WBC 4.7, hemoglobin 10.8, platelets 234,000.  EKG showed atrial fibrillation with no ST-T wave changes.  The patient was not actually noted to be hypothermic with a temperature 92.7.  She was placed on a warming blanket. Prolonged hospitalization due to slow renal recovery.  Nephrology consulted and  following  Assessment and Plan: 1)CKD V- Prior to admission at baseline patient has CKD 4  -Renal status worsened in the setting of  volume depletion and hemodynamic changes Baseline creatinine 3.0-3.3 prior to admission -Despite IV fluids and interventions renal function did not recover -Renal consult appreciated -General surgery placed HD catheter on 01/03/2022 --Hemodialysis started on 01/03/2022 and 01/04/2022 and 01/06/22 -She has been set up with outpatient dialysis center for Monday, Wednesday, Friday -Next hemodialysis session should be Monday, 01/09/2022  Paroxysmal A-fib ([name])  adjusted apixaban dosage  -continue coreg for rate control   Essential hypertension Continue amlodipine, carvedilol, hydralazine   Hyperkalemia Resolved with Lokelma and hemodialysis sessions -Repeat BMP within a week   Hypothermia Presented with temperature 92.7 cortisol level--16.6 12/16/2021 TSH 6.574, free T4--1.22 PCT--0.50 Blood culture x2 sets--neg to date Suspect hypothermia-related to hypoglycemia, now resolved   Chronic diastolic CHF (congestive heart failure) ([place]) 10/27/2021  echo EF 60 to 65%, no WMA, trivial MR Further management of volume status to be done through dialysis -Low-salt diet, Lasix, Coreg and outpatient follow-up with cardiology advised   Hypoglycemia associated with type 2 diabetes mellitus ([place]) Suspect variable oral intake in the setting of long-acting Lantus and short acting insulin with worsen renal funciton Holding Lantus Okay to discharge on NovoLog sliding scale with meals 10/26/2021 hemoglobin A1c 6.7 No further hypoglycemic episodes   Hyperlipidemia Continue Lipitor   Anemia of chronic kidney disease -She has been receiving intravenous iron and ESA---outpatient IV iron and Aranesp with hemodialysis as advised -She received 1 unit of PRBC for hemoglobin of 7.1 - hemoglobin improved  -CBC within a week   Goals of care -palliative care following -patient  now DNR   Disposition---She has been set up with outpatient dialysis center for Monday, Wednesday, Friday -Discharge to SNF facility   Family Communication:   Daughter previously updated   Consultants:  nephrology, palliative care, general surgery   Code Status:  DNR   Procedures: As Listed in Progress Note Above -Hemodialysis on 01/03/2022, 01/04/2022 and 01/06/22  Discharge Condition: stable  Follow UP   Contact information for follow-up providers     Simpson-Tarokh, Leann, DO Follow up in 2 week(s).   Specialty: Family Medicine Contact information: [place] [address] [phone]         Arnoldo Lenis, MD .   Specialty: Cardiology Contact information: [address] [place] [address] [phone]              Contact information for after-discharge care     [facility] .   Service: Skilled Nursing Contact information: [address] [place] [phone]                    Diet and Activity recommendation:  As advised  Discharge Instructions    Discharge Instructions     Call MD for:  difficulty breathing, headache or visual disturbances   Complete by: As directed    Call MD for:  persistant dizziness or light-headedness   Complete by: As directed    Call MD for:  persistant nausea and vomiting   Complete by: As directed    Call MD for:  severe uncontrolled pain   Complete by: As directed    Call MD for:  temperature >100.4   Complete by: As directed    Diet - low sodium heart healthy   Complete by: As directed    Diet Carb Modified   Complete by: As directed    Discharge instructions   Complete by: As directed    1)Very low-salt diet advised 2)Weigh yourself daily, call if you gain more than 3 pounds in 1 day or more than 5 pounds in 1 week as your diuretic medications and your hemodialysis dry weight may need to be  adjusted 3)Limit your Fluid  intake to no more than 60 ounces (1.8 Liters) per day 4)Avoid ibuprofen/Advil/Aleve/Motrin/Goody Powders/Naproxen/BC powders/Meloxicam/Diclofenac/Indomethacin and other Nonsteroidal anti-inflammatory medications as these will make you more likely to bleed and can cause stomach ulcers, can also cause Kidney problems.  5) repeat CBC and BMP blood test within the week 6)NovoLog insulin per sliding scale glucose 0-120--no units; 121 to 150-1 unit; 151 to 200-2 units; 201 to 250-3 units; 251 to 300-5 units; 30 to 350-7 units; 351 to 400-9 units   Increase activity slowly  Complete by: As directed    No wound care   Complete by: As directed          Discharge Medications     Allergies as of 01/08/2022   No Known Allergies      Medication List     STOP taking these medications    insulin lispro 100 UNIT/ML KwikPen Commonly known as: HUMALOG   Lantus SoloStar 100 UNIT/ML Solostar Pen Generic drug: insulin glargine   VITAMIN B-12 CR PO       TAKE these medications    Accu-Chek FastClix Lancets Misc   Accu-Chek Guide test strip Generic drug: glucose blood USE TO TEST TWICE DAILY.AL   Accu-Chek Guide w/Device Kit USE AS DIRECTED.I   acetaminophen 325 MG tablet Commonly known as: TYLENOL Take 2 tablets (650 mg total) by mouth every 6 (six) hours as needed for mild pain, fever or headache (or Fever >/= 101).   amLODipine 10 MG tablet Commonly known as: NORVASC Take 1 tablet (10 mg total) by mouth daily.   apixaban 2.5 MG Tabs tablet Commonly known as: ELIQUIS Take 1 tablet (2.5 mg total) by mouth 2 (two) times daily.   atorvastatin 40 MG tablet Commonly known as: LIPITOR Take 40 mg by mouth at bedtime.   carvedilol 3.125 MG tablet Commonly known as: COREG Take 1 tablet (3.125 mg total) by mouth 2 (two) times daily with a meal.   Darbepoetin Alfa 200 MCG/0.4ML Sosy injection Commonly known as: ARANESP Inject 0.4 mLs (200 mcg total)  into the skin every 7 (seven) days.   feeding supplement Liqd Take 237 mLs by mouth 2 (two) times daily between meals.   furosemide 40 MG tablet Commonly known as: LASIX Take 1 tablet (40 mg total) by mouth daily.   Gvoke PFS 0.5 MG/0.1ML Sosy Generic drug: Glucagon Inject 0.5 mg into the skin as needed (low bs).   hydrALAZINE 100 MG tablet Commonly known as: APRESOLINE Take 1 tablet (100 mg total) by mouth 3 (three) times daily. What changed: when to take this   insulin aspart 100 UNIT/ML injection Commonly known as: novoLOG NovoLog insulin per sliding scale glucose 0-120--no units; 121 to 150-1 unit; 151 to 200-2 units; 201 to 250-3 units; 251 to 300-5 units; 30 to 350-7 units; 351 to 400-9 units   iron sucrose 100 mg in sodium chloride 0.9 % 100 mL Inject 100 mg into the vein every Monday, Wednesday, and Friday with hemodialysis. Start taking on: January 09, 2022   lidocaine 5 % Commonly known as: Lidoderm Place 1 patch onto the skin daily. Remove & Discard patch within 12 hours or as directed by MD   Pen Needles 32G X 4 MM Misc Use as directed   sodium zirconium cyclosilicate 10 g Pack packet Commonly known as: LOKELMA Take 10 g by mouth every Monday, Wednesday, and Friday.        Major procedures and Radiology Reports - PLEASE review detailed and final reports for all details, in brief -   DG Chest 2 View  Result Date: 01/05/2022 CLINICAL DATA:  Shortness of breath and weakness. EXAM: CHEST - 2 VIEW COMPARISON:  Radiograph 01/03/2021 FINDINGS: Stable positioning of right internal jugular dialysis catheter. Stable cardiomegaly. Unchanged mediastinal contours. Retrocardiac opacity appears similar. There may be small bilateral pleural effusions. No pulmonary edema. No pneumothorax. IMPRESSION: 1. Unchanged cardiomegaly. 2. Retrocardiac opacity may represent pneumonia, atelectasis or partial lobar collapse. 3. Possible small pleural effusions. Electronically Signed    By: Threasa Beards  Sanford M.D.   On: 01/05/2022 16:05   DG Chest Port 1 View  Result Date: 01/03/2022 CLINICAL DATA:  Status post dialysis catheter placement. EXAM: PORTABLE CHEST 1 VIEW COMPARISON:  December 16, 2021. FINDINGS: Stable cardiomegaly. Interval placement of right internal jugular dialysis catheter with distal tip in expected position of superior vena cava. No pneumothorax is noted. Right lung is clear. Left basilar atelectasis is noted. Bony thorax is unremarkable. IMPRESSION: Interval placement of right internal jugular dialysis catheter with distal tip in expected position of SVC. Left basilar atelectasis is noted. Electronically Signed   By: Marijo Conception M.D.   On: 01/03/2022 14:28   DG C-Arm 1-60 Min-No Report  Result Date: 01/03/2022 Fluoroscopy was utilized by the requesting physician.  No radiographic interpretation.   US RENAL  Result Date: 12/27/2021 CLINICAL DATA:  Acute kidney insufficiency on admission. EXAM: RENAL / URINARY TRACT ULTRASOUND COMPLETE COMPARISON:  Renal ultrasound 04/05/2020, CT abdomen and pelvis 02/21/2021 FINDINGS: Right Kidney: Renal measurements: 9.7 x 4.7 x 5.4 cm = volume: 129 mL. Increased cortical echogenicity as can be seen with medical renal disease, similar to prior. Lateral right midpole hypoechoic cyst measuring up to 1.1 cm, benign. No suspicious right renal mass is seen. Left Kidney: Limited evaluation of the left kidney due to overlying bowel gas. Renal measurements: Maximally 9.0 x 4.5 x 3.2 cm = volume: Approximately 67 mL. There again appears to be increased echogenicity of the left renal cortex as can be seen with medical renal disease. No gross cyst or solid mass is seen. Bladder: Only mildly distended, limiting evaluation. Other: None. IMPRESSION: 1. Evaluation of the left kidney is limited by overlying bowel gas. No hydronephrosis is seen either side. 2. There is again increased cortical echogenicity as can be seen with medical renal disease.  Electronically Signed   By: Yvonne Kendall M.D.   On: 12/27/2021 10:47   DG Chest Portable 1 View  Result Date: 12/16/2021 CLINICAL DATA:  Difficult the rising to standing position. EXAM: PORTABLE CHEST 1 VIEW COMPARISON:  11/10/2021 FINDINGS: Lungs are hypoinflated and otherwise clear. Mild stable cardiomegaly. Remainder of the exam is unchanged. IMPRESSION: Hypoinflation without acute cardiopulmonary disease. Electronically Signed   By: Marin Olp M.D.   On: 12/16/2021 13:15    Micro Results   Today   Subjective    Mee Hives today has no new complaints No fever  Or chills   No Nausea, Vomiting or Diarrhea -- Eating and drinking well   Patient has been seen and examined prior to discharge   Objective   Blood pressure (!) 137/93, pulse 77, temperature 98.3 F (36.8 C), temperature source Oral, resp. rate 18, height '5\' 10"'  (1.778 m), weight 94 kg, SpO2 97 %.   Intake/Output Summary (Last 24 hours) at 01/08/2022 1130 Last data filed at 01/08/2022 0900 Gross per 24 hour  Intake 480 ml  Output --  Net 480 ml    Exam Gen:- Awake Alert, in no acute distress  HEENT:- [place].AT, No sclera icterus Neck-Supple Neck, Rt IJ HD catheter Lungs-  CTAB , fair air movement bilaterally  CV- S1, S2 normal, RRR Abd-  +ve B.Sounds, Abd Soft, No tenderness,    Extremity/Skin:- No  edema,   good pedal pulses  Psych-affect is appropriate, oriented x3 Neuro-Generalized weakness, no new focal deficits, no tremors   Data Review   CBC w Diff:  Lab Results  Component Value Date   WBC 8.2 01/04/2022   HGB 8.5 (L) 01/04/2022  HCT 25.3 (L) 01/04/2022   PLT 282 01/04/2022   LYMPHOPCT 26 12/21/2021   MONOPCT 3 12/21/2021   EOSPCT 0 12/21/2021   BASOPCT 0 12/21/2021    CMP:  Lab Results  Component Value Date   NA 134 (L) 01/06/2022   K 3.8 01/06/2022   CL 101 01/06/2022   CO2 24 01/06/2022   BUN 78 (H) 01/06/2022   CREATININE 3.55 (H) 01/06/2022   PROT 6.5 12/21/2021   ALBUMIN  2.1 (L) 01/06/2022   BILITOT 0.7 12/21/2021   ALKPHOS 46 12/21/2021   AST 22 12/21/2021   ALT 21 12/21/2021  .  Total Discharge time is about 33 minutes  Roxan Hockey M.D on 01/08/2022 at 11:30 AM  Go to [email or web address] -  for contact info  [facility] - Office  [phone]

## 2022-01-09 DIAGNOSIS — N186 End stage renal disease: Secondary | ICD-10-CM | POA: Diagnosis not present

## 2022-01-09 DIAGNOSIS — I509 Heart failure, unspecified: Secondary | ICD-10-CM | POA: Diagnosis not present

## 2022-01-09 DIAGNOSIS — I10 Essential (primary) hypertension: Secondary | ICD-10-CM | POA: Diagnosis not present

## 2022-01-09 DIAGNOSIS — M6281 Muscle weakness (generalized): Secondary | ICD-10-CM | POA: Diagnosis not present

## 2022-01-09 DIAGNOSIS — E119 Type 2 diabetes mellitus without complications: Secondary | ICD-10-CM | POA: Diagnosis not present

## 2022-01-09 DIAGNOSIS — Z992 Dependence on renal dialysis: Secondary | ICD-10-CM | POA: Diagnosis not present

## 2022-01-10 ENCOUNTER — Encounter (HOSPITAL_COMMUNITY): Payer: Self-pay | Admitting: General Surgery

## 2022-01-10 DIAGNOSIS — E1122 Type 2 diabetes mellitus with diabetic chronic kidney disease: Secondary | ICD-10-CM | POA: Diagnosis not present

## 2022-01-10 DIAGNOSIS — E162 Hypoglycemia, unspecified: Secondary | ICD-10-CM | POA: Diagnosis not present

## 2022-01-10 DIAGNOSIS — I4891 Unspecified atrial fibrillation: Secondary | ICD-10-CM | POA: Diagnosis not present

## 2022-01-10 DIAGNOSIS — Z992 Dependence on renal dialysis: Secondary | ICD-10-CM | POA: Diagnosis not present

## 2022-01-10 DIAGNOSIS — E785 Hyperlipidemia, unspecified: Secondary | ICD-10-CM | POA: Diagnosis not present

## 2022-01-10 DIAGNOSIS — N189 Chronic kidney disease, unspecified: Secondary | ICD-10-CM | POA: Diagnosis not present

## 2022-01-10 DIAGNOSIS — I509 Heart failure, unspecified: Secondary | ICD-10-CM | POA: Diagnosis not present

## 2022-01-10 DIAGNOSIS — I10 Essential (primary) hypertension: Secondary | ICD-10-CM | POA: Diagnosis not present

## 2022-01-11 DIAGNOSIS — Z992 Dependence on renal dialysis: Secondary | ICD-10-CM | POA: Diagnosis not present

## 2022-01-11 DIAGNOSIS — N186 End stage renal disease: Secondary | ICD-10-CM | POA: Diagnosis not present

## 2022-01-13 DIAGNOSIS — Z23 Encounter for immunization: Secondary | ICD-10-CM | POA: Diagnosis not present

## 2022-01-13 DIAGNOSIS — N186 End stage renal disease: Secondary | ICD-10-CM | POA: Diagnosis not present

## 2022-01-13 DIAGNOSIS — M6281 Muscle weakness (generalized): Secondary | ICD-10-CM | POA: Diagnosis not present

## 2022-01-13 DIAGNOSIS — Z992 Dependence on renal dialysis: Secondary | ICD-10-CM | POA: Diagnosis not present

## 2022-01-13 DIAGNOSIS — E119 Type 2 diabetes mellitus without complications: Secondary | ICD-10-CM | POA: Diagnosis not present

## 2022-01-13 DIAGNOSIS — I10 Essential (primary) hypertension: Secondary | ICD-10-CM | POA: Diagnosis not present

## 2022-01-13 DIAGNOSIS — N179 Acute kidney failure, unspecified: Secondary | ICD-10-CM | POA: Diagnosis not present

## 2022-01-13 DIAGNOSIS — I509 Heart failure, unspecified: Secondary | ICD-10-CM | POA: Diagnosis not present

## 2022-01-13 DIAGNOSIS — N189 Chronic kidney disease, unspecified: Secondary | ICD-10-CM | POA: Diagnosis not present

## 2022-01-16 DIAGNOSIS — Z23 Encounter for immunization: Secondary | ICD-10-CM | POA: Diagnosis not present

## 2022-01-16 DIAGNOSIS — M6281 Muscle weakness (generalized): Secondary | ICD-10-CM | POA: Diagnosis not present

## 2022-01-16 DIAGNOSIS — I509 Heart failure, unspecified: Secondary | ICD-10-CM | POA: Diagnosis not present

## 2022-01-16 DIAGNOSIS — N179 Acute kidney failure, unspecified: Secondary | ICD-10-CM | POA: Diagnosis not present

## 2022-01-16 DIAGNOSIS — N189 Chronic kidney disease, unspecified: Secondary | ICD-10-CM | POA: Diagnosis not present

## 2022-01-16 DIAGNOSIS — Z992 Dependence on renal dialysis: Secondary | ICD-10-CM | POA: Diagnosis not present

## 2022-01-16 DIAGNOSIS — N186 End stage renal disease: Secondary | ICD-10-CM | POA: Diagnosis not present

## 2022-01-16 DIAGNOSIS — I10 Essential (primary) hypertension: Secondary | ICD-10-CM | POA: Diagnosis not present

## 2022-01-16 DIAGNOSIS — E119 Type 2 diabetes mellitus without complications: Secondary | ICD-10-CM | POA: Diagnosis not present

## 2022-01-17 DIAGNOSIS — E1165 Type 2 diabetes mellitus with hyperglycemia: Secondary | ICD-10-CM | POA: Diagnosis not present

## 2022-01-17 DIAGNOSIS — I482 Chronic atrial fibrillation, unspecified: Secondary | ICD-10-CM | POA: Diagnosis not present

## 2022-01-18 DIAGNOSIS — N179 Acute kidney failure, unspecified: Secondary | ICD-10-CM | POA: Diagnosis not present

## 2022-01-18 DIAGNOSIS — N189 Chronic kidney disease, unspecified: Secondary | ICD-10-CM | POA: Diagnosis not present

## 2022-01-18 DIAGNOSIS — Z23 Encounter for immunization: Secondary | ICD-10-CM | POA: Diagnosis not present

## 2022-01-19 ENCOUNTER — Non-Acute Institutional Stay: Payer: Medicare Other | Admitting: Primary Care

## 2022-01-19 ENCOUNTER — Non-Acute Institutional Stay: Payer: Medicare Other

## 2022-01-19 VITALS — Ht 70.0 in | Wt 207.0 lb

## 2022-01-19 DIAGNOSIS — R5381 Other malaise: Secondary | ICD-10-CM

## 2022-01-19 DIAGNOSIS — M792 Neuralgia and neuritis, unspecified: Secondary | ICD-10-CM | POA: Diagnosis not present

## 2022-01-19 DIAGNOSIS — E1165 Type 2 diabetes mellitus with hyperglycemia: Secondary | ICD-10-CM

## 2022-01-19 DIAGNOSIS — I5032 Chronic diastolic (congestive) heart failure: Secondary | ICD-10-CM | POA: Diagnosis not present

## 2022-01-19 DIAGNOSIS — Z515 Encounter for palliative care: Secondary | ICD-10-CM | POA: Diagnosis not present

## 2022-01-19 NOTE — Progress Notes (Signed)
[facility] Consult Note Telephone: [phone]  Fax: [phone]   Date of encounter: 01/19/22 9:50 AM PATIENT NAME: Chelsea Meza [address] [place] [address]   [phone] (home)  DOB: Feb 23, 1936 MRN: [id number] PRIMARY CARE PROVIDER:    Hal Morales, DO,  [place] [address] [phone]  REFERRING PROVIDER:   Moss Mc, NP [place],  [place] [address] [phone]   RESPONSIBLE PARTY:    Contact Information     Name Relation Home Work [place] Daughter [phone]  [phone]   Chelsea Meza Daughter [phone]          Due to the COVID-19 crisis, this visit was done via telemedicine from my office and it was initiated and consent by this patient and or family.  I connected with  Chelsea Meza OR PROXY on 01/19/22 by a video enabled telemedicine application and verified that I am speaking with the correct person using two identifiers.   I discussed the limitations of evaluation and management by telemedicine. The patient expressed understanding and agreed to proceed.  Palliative Care was asked to follow this patient by consultation request of  Chelsea Mc, NP  to address advance care planning and complex medical decision making. This is the initial visit.                                     ASSESSMENT AND PLAN / RECOMMENDATIONS:   Advance Care Planning/Goals of Care: Goals include to maximize quality of life and symptom management. Patient/health care surrogate gave his/her permission to discuss.Our advance care planning conversation included a discussion about:     Exploration of personal, cultural or spiritual beliefs that might influence medical decisions  Exploration of goals of care in the event of a sudden injury or illness  Identification of a healthcare agent - Daughter who is a nurse She will return to live with daughter  after Skilled stay She accepts home visits by NP once d/c.  CODE STATUS: DNR  Symptom Management/Plan:  Peripheral neuropathy c/o pain:, recommend lyrica for DPN, per facility PCP Debility: Having PT here, looking forward to improving. Will need Home health once d/c. ESRD; new on HD, new port placed, will HD MWF. Follow for PC Mood: Pt endorses she is hopeful and working towards getting better and going home.  Follow up Palliative Care Visit: Palliative care will continue to follow for complex medical decision making, advance care planning, and clarification of goals. Return 3-4 weeks or prn.  This visit was coded based on medical decision making (MDM).  PPS: 40%  HOSPICE ELIGIBILITY/DIAGNOSIS: TBD  Chief Complaint: debility, peripheral pain  HISTORY OF PRESENT ILLNESS:  [name] is a 86 y.o. year old female  with DM, CHF, debility, LE pain described as neuropathic . Patient seen today to review palliative care needs to include medical decision making and advance care planning as appropriate.   History obtained from review of EMR, discussion with primary team, and interview with family, facility staff/caregiver and/or Chelsea Meza.  I reviewed available labs, medications, imaging, studies and related documents from the EMR.  Records reviewed and summarized above.   ROS   General: NAD ENMT: denies dysphagia Pulmonary: denies cough, denies increased SOB Abdomen: endorses good appetite, denies constipation GU: denies dysuria,  on HD MSK: endorses  increased weakness,  no  falls reported Skin: denies rashes or wounds Neurological: endorses LE  pain, denies insomnia Psych: Endorses positive mood Heme/lymph/immuno: denies bruises, abnormal bleeding  Physical Exam: Current and past weights: 207 lbs Body mass index is 29.7 kg/m. Constitutional: NAD General: frail appearing EYES: anicteric sclera, lids intact, no discharge  ENMT: intact hearing Pulmonary: no increased  work of breathing, no cough, room air MSK: no sarcopenia, non ambulatory Skin: warm and dry, no rashes or wounds on visible skin Neuro:  ++ generalized weakness,  no cognitive impairment Psych: non-anxious affect, A and O x 3 Hem/lymph/immuno: no widespread bruising CURRENT PROBLEM LIST:  Patient Active Problem List   Diagnosis Date Noted   Hyperkalemia 12/24/2021   Acute renal failure superimposed on stage 4 chronic kidney disease, unspecified acute renal failure type ([place]) 12/21/2021   Chronic diastolic CHF (congestive heart failure) ([place]) 12/21/2021   Hypothermia 12/21/2021   Thyroid dysfunction 12/17/2021   Debility 12/16/2021    Class: Acute   A-fib ([facility]) 12/16/2021    Class: Chronic   Lower extremity edema 12/16/2021   Pressure injury of skin 02/24/2021   Seizures ([place]) 16/38/4536   Acute metabolic encephalopathy 46/80/3212   Acute lower UTI 02/23/2021   Malnutrition of moderate degree 06/23/2020   Poorly controlled diabetes mellitus ([place]) 06/21/2020   Hypoglycemia associated with type 2 diabetes mellitus ([place]) 06/21/2020   Hypertensive urgency 04/05/2020   Cardiac arrest ([place]) 04/05/2020   Acute congestive heart failure ([place]) 10/18/2018   Hyperosmolar non-ketotic state in patient with type 2 diabetes mellitus ([place]) 10/18/2018   Transient cerebral ischemia 11/05/2017   Hyperglycemia due to diabetes mellitus ([place]) 03/20/2015   Hyperlipidemia 03/20/2015   Diabetic hyperosmolar non-ketotic state ([place]) 07/14/2012   DKA, type 2 ([place]) 07/13/2012   Acute kidney injury superimposed on CKD ([place]) 07/13/2012   Paroxysmal supraventricular tachycardia ([place]) 07/13/2012   Paraparesis ([place]) 07/13/2012   Paresthesia of left leg 07/13/2012   Essential hypertension 07/13/2012   Paresthesia of skin 07/13/2012   PAST MEDICAL HISTORY:  Active Ambulatory Problems    Diagnosis Date Noted   DKA, type 2 ([place]) 07/13/2012   Acute kidney injury superimposed on CKD ([place]) 07/13/2012    Paroxysmal supraventricular tachycardia ([place]) 07/13/2012   Paraparesis ([place]) 07/13/2012   Paresthesia of left leg 07/13/2012   Essential hypertension 07/13/2012   Diabetic hyperosmolar non-ketotic state ([place]) 07/14/2012   Hyperglycemia due to diabetes mellitus ([place]) 03/20/2015   Hyperlipidemia 03/20/2015   Transient cerebral ischemia 11/05/2017   Acute congestive heart failure ([place]) 10/18/2018   Hyperosmolar non-ketotic state in patient with type 2 diabetes mellitus ([place]) 10/18/2018   Hypertensive urgency 04/05/2020   Cardiac arrest ([place]) 04/05/2020   Poorly controlled diabetes mellitus ([place]) 06/21/2020   Hypoglycemia associated with type 2 diabetes mellitus ([place]) 06/21/2020   Malnutrition of moderate degree 06/23/2020   Paresthesia of skin 07/13/2012   Seizures ([place]) 24/82/5003   Acute metabolic encephalopathy 70/48/8891   Acute lower UTI 02/23/2021   Pressure injury of skin 02/24/2021   Debility 12/16/2021   A-fib ([place]) 12/16/2021   Lower extremity edema 12/16/2021   Thyroid dysfunction 12/17/2021   Acute renal failure superimposed on stage 4 chronic kidney disease, unspecified acute renal failure type ([place]) 12/21/2021   Chronic diastolic CHF (congestive heart failure) ([place]) 12/21/2021   Hypothermia 12/21/2021   Hyperkalemia 12/24/2021   Resolved Ambulatory Problems    Diagnosis Date Noted   Cough 07/13/2012   Lactic acidosis 07/14/2012   Hyponatremia 03/20/2015   AKI (acute kidney injury) ([place])  03/20/2015   CKD (chronic kidney disease) stage 3, GFR 30-59 ml/min ([name]) 03/20/2015   Type 2 diabetes mellitus with ketoacidosis without coma ([place]) 07/13/2012   Hypokalemia 11/05/2017   Hyperosmolar hyperglycemic state (HHS) ([place]) 04/05/2020   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   Elevated troponin 02/23/2021   DKA (diabetic ketoacidosis) ([place]) 02/23/2021   Past Medical History:  Diagnosis Date   Anemia    Chronic atrial fibrillation ([name])    Diabetes mellitus  without complication ([name])    Hypertension    Localized edema    Other specified disorders of thyroid    TIA (transient ischemic attack)    Unspecified convulsions ([name])    SOCIAL HX:  Social History   Tobacco Use   Smoking status: Never   Smokeless tobacco: Never  Substance Use Topics   Alcohol use: No   FAMILY HX:  Family History  Problem Relation Age of Onset   Stroke Mother    Stroke Maternal Grandmother       ALLERGIES: No Known Allergies   PERTINENT MEDICATIONS: on file in Epic. Outpatient Encounter Medications as of 01/19/2022  Medication Sig   Accu-Chek FastClix Lancets MISC    ACCU-CHEK GUIDE test strip USE TO TEST TWICE DAILY.AL   acetaminophen (TYLENOL) 325 MG tablet Take 2 tablets (650 mg total) by mouth every 6 (six) hours as needed for mild pain, fever or headache (or Fever >/= 101).   amLODipine (NORVASC) 10 MG tablet Take 1 tablet (10 mg total) by mouth daily.   apixaban (ELIQUIS) 2.5 MG TABS tablet Take 1 tablet (2.5 mg total) by mouth 2 (two) times daily.   atorvastatin (LIPITOR) 40 MG tablet Take 40 mg by mouth at bedtime.   Blood Glucose Monitoring Suppl (ACCU-CHEK GUIDE) w/Device KIT USE AS DIRECTED.I   carvedilol (COREG) 3.125 MG tablet Take 1 tablet (3.125 mg total) by mouth 2 (two) times daily with a meal.   Darbepoetin Alfa (ARANESP) 200 MCG/0.4ML SOSY injection Inject 0.4 mLs (200 mcg total) into the skin every 7 (seven) days.   feeding supplement (ENSURE ENLIVE / ENSURE PLUS) LIQD Take 237 mLs by mouth 2 (two) times daily between meals.   furosemide (LASIX) 40 MG tablet Take 1 tablet (40 mg total) by mouth daily.   GVOKE PFS 0.5 MG/0.1ML SOSY Inject 0.5 mg into the skin as needed (low bs).   hydrALAZINE (APRESOLINE) 100 MG tablet Take 1 tablet (100 mg total) by mouth 3 (three) times daily. (Patient taking differently: Take 100 mg by mouth 2 (two) times daily.)   insulin aspart (NOVOLOG) 100 UNIT/ML injection NovoLog insulin per sliding scale glucose  0-120--no units; 121 to 150-1 unit; 151 to 200-2 units; 201 to 250-3 units; 251 to 300-5 units; 30 to 350-7 units; 351 to 400-9 units   Insulin Pen Needle (PEN NEEDLES) 32G X 4 MM MISC Use as directed   iron sucrose 100 mg in sodium chloride 0.9 % 100 mL Inject 100 mg into the vein every Monday, Wednesday, and Friday with hemodialysis.   lidocaine (LIDODERM) 5 % Place 1 patch onto the skin daily. Remove & Discard patch within 12 hours or as directed by MD   sodium zirconium cyclosilicate (LOKELMA) 10 g PACK packet Take 10 g by mouth every Monday, Wednesday, and Friday.   No facility-administered encounter medications on file as of 01/19/2022.   Thank you for the opportunity to participate in the care of Chelsea Meza.  The palliative care team will continue to follow. Please  call our office at [phone] if we can be of additional assistance.   Jason Coop, NP , DNP, AGPCNP-BC  COVID-19 PATIENT SCREENING TOOL Asked and negative response unless otherwise noted:  Have you had symptoms of covid, tested positive or been in contact with someone with symptoms/positive test in the past 5-10 days?

## 2022-01-19 NOTE — Progress Notes (Signed)
PATIENT NAME: Chelsea Meza DOB: 08-15-1935 MRN: [id number]  PRIMARY CARE PROVIDER: Hal Morales, DO  RESPONSIBLE PARTY:  Acct ID - Guarantor Home Phone Work Phone Relationship Acct Type  [id number] Satira [phone]  Self P/F     [address] [place], [place] [address]    Patient in bed receiving am care from facility aide.   Patient endorses she is scheduled for therapy once she is up in her wheelchair.  Advised patient I was with Palliative Care and connected her with Ralene Bathe, NP for a virtual visit.  Patient endorses she is present at [facility] for short term rehab.  She is planning on going home where she lives with her daughter who is a Marine scientist.  Patient advises her daughter is working to find an aide to assist patient while she is at work.  Patient states she was ambulatory prior to her hospitalizations but only for short distances.  She is currently working with PT/OT in the facility.  Independent with bed mobility and continues to work with OT sitting on the edge of the bed and doing upper body care.  Spoke with [facility] nurse who has no new concerns at this time.  Kathy-nurse stopped me in the hall after patient was seen to advised daughter and patient declined referral and it was pulled yesterday.  Advised that our office was not notified of this and patient had already been seen.  I will contact daughter to let her know and see if she would like to continue with services.  5 attempts made to daughter [name] but unable to reach her or leave a message due to a block that is on the phone.  Vance Gather, RN.

## 2022-01-20 DIAGNOSIS — N179 Acute kidney failure, unspecified: Secondary | ICD-10-CM | POA: Diagnosis not present

## 2022-01-20 DIAGNOSIS — Z23 Encounter for immunization: Secondary | ICD-10-CM | POA: Diagnosis not present

## 2022-01-20 DIAGNOSIS — I482 Chronic atrial fibrillation, unspecified: Secondary | ICD-10-CM | POA: Diagnosis not present

## 2022-01-20 DIAGNOSIS — N189 Chronic kidney disease, unspecified: Secondary | ICD-10-CM | POA: Diagnosis not present

## 2022-01-20 DIAGNOSIS — N184 Chronic kidney disease, stage 4 (severe): Secondary | ICD-10-CM | POA: Diagnosis not present

## 2022-01-23 DIAGNOSIS — Z23 Encounter for immunization: Secondary | ICD-10-CM | POA: Diagnosis not present

## 2022-01-23 DIAGNOSIS — N189 Chronic kidney disease, unspecified: Secondary | ICD-10-CM | POA: Diagnosis not present

## 2022-01-23 DIAGNOSIS — N179 Acute kidney failure, unspecified: Secondary | ICD-10-CM | POA: Diagnosis not present

## 2022-01-23 NOTE — Progress Notes (Deleted)
Cardiology Office Note:    Date:  01/23/2022   ID:  Chelsea Meza, DOB 26-Dec-1935, MRN [id number]  PCP:  Hal Morales, [facility] Providers Cardiologist:  Carlyle Dolly, MD { Click to update primary MD,subspecialty MD or APP then REFRESH:1}  *** Referring MD: Hal Morales, *   Chief Complaint:  No chief complaint on file. {Click here for Visit Info    :1}    History of Present Illness:   Chelsea Meza is a 86 y.o. female with a hx of DM, HTN, HLD, CKD IV, TIA, PSVT. Was seen by [place] in the hospital 09/2018 with SVT in setting of glucose over 800. She had compliance issues with Verapamil. Echo 10/20/19 LVEF 60-65% mod LV wall thickness, impaired relaxation, smallcircumferential pericardial effusion. Holter monitor ordered by PCP 09/2019 but never done.   Admission 03/2020 with Na 119 lactic acidosis 2.8.She became nauseated and  had cardiac arrest with ROSC.She became hypotensive with BP 67/44, lost of pulse tansiently treated with atropine 1 mg and epi 1 mg IV then became obtunded and CPR 3-4 min. Monitor showed VT followed by bradycardia with ROSC. She became hypotensive again treated with fluid bolus, levophed and epinephrine. EKG had ant ST elevation that resolved on f/u EKG . Echo normal LVEF  Patient admitted 01/08/22 with stage 5 CKD and started on HD. Echo 09/2021 LVEF 60-65%      Past Medical History:  Diagnosis Date   Anemia    Chronic atrial fibrillation ([name])    CKD (chronic kidney disease) stage 3, GFR 30-59 ml/min ([name])    Diabetes mellitus without complication ([place])    Hyperlipidemia    Hypertension    Localized edema    Other specified disorders of thyroid    TIA (transient ischemic attack)    Unspecified convulsions ([place])    Current Medications: No outpatient medications have been marked as taking for the 01/30/22 encounter (Appointment) with Imogene Burn, PA-C.    Allergies:   Patient has no known allergies.   Social  History   Tobacco Use   Smoking status: Never   Smokeless tobacco: Never  Vaping Use   Vaping Use: Never used  Substance Use Topics   Alcohol use: No   Drug use: No    Family Hx: The patient's family history includes Stroke in her maternal grandmother and mother.  ROS   EKGs/Labs/Other Test Reviewed:    EKG:  EKG is *** ordered today.  The ekg ordered today demonstrates ***  Recent Labs: 12/16/2021: Magnesium 2.2 12/21/2021: ALT 21 12/27/2021: B Natriuretic Peptide 87.0; TSH 9.327 01/04/2022: Hemoglobin 8.5; Platelets 282 01/06/2022: BUN 78; Creatinine, Ser 3.55; Potassium 3.8; Sodium 134   Recent Lipid Panel No results for input(s): "CHOL", "TRIG", "HDL", "VLDL", "LDLCALC", "LDLDIRECT" in the last 8760 hours.   Prior CV Studies: ECHO COMPLETE WO IMAGING ENHANCING AGENT 10/27/2021  Narrative ECHOCARDIOGRAM REPORT    Patient Name:   Chelsea Meza Date of Exam: 10/27/2021 Medical Rec #:  [id number]         Height:       70.0 in Accession #:    [id number]        Weight:       120.0 lb Date of Birth:  1935/09/29         BSA:          1.680 m Patient Age:    69 years          BP:  196/115 mmHg Patient Gender: F                 HR:           77 bpm. Exam Location:  [facility]  Procedure: 2D Echo, Cardiac Doppler and Color Doppler  Indications:    Atrial fibrillation  History:        Patient has prior history of Echocardiogram examinations, most recent 04/05/2020. CHF, Previous Myocardial Infarction, TIA, Arrythmias:Atrial Fibrillation and Tachycardia; Risk Factors:Hypertension, Diabetes and Dyslipidemia.  Sonographer:    Wenda Low Referring Phys: [id number] [place]   1. Echocardiographic features concerning for infiltrative disease such as amyloidosis. Left ventricular ejection fraction, by estimation, is 60 to 65%. The left ventricle has normal function. The left ventricle has no regional wall motion abnormalities. There is severe  left ventricular hypertrophy. Left ventricular diastolic function could not be evaluated. 2. Right ventricular systolic function is normal. The right ventricular size is normal. Moderately increased right ventricular wall thickness. There is mildly elevated pulmonary artery systolic pressure. The estimated right ventricular systolic pressure is 95.6 mmHg. 3. Left atrial size was moderately dilated. 4. A small pericardial effusion is present. The pericardial effusion is localized near the right atrium. There is no evidence of cardiac tamponade. 5. The mitral valve is abnormal. Trivial mitral valve regurgitation. 6. The aortic valve is tricuspid. Aortic valve regurgitation is trivial. Aortic valve sclerosis is present, with no evidence of aortic valve stenosis. 7. Aortic dilatation noted. There is mild dilatation of the aortic root, measuring 40 mm. 8. The inferior vena cava is normal in size with <50% respiratory variability, suggesting right atrial pressure of 8 mmHg.  Comparison(s): Changes from prior study are noted. 04/05/2020: LVEF 65-70%, severe LVH, pericardial effusion.  Conclusion(s)/Recommendation(s): Would consider evaluation for possible cardiac amyloidosis with PYP scan and/or cMRI.  FINDINGS Left Ventricle: Echocardiographic features concerning for infiltrative disease such as amyloidosis. Left ventricular ejection fraction, by estimation, is 60 to 65%. The left ventricle has normal function. The left ventricle has no regional wall motion abnormalities. The left ventricular internal cavity size was normal in size. There is severe left ventricular hypertrophy. Left ventricular diastolic function could not be evaluated due to atrial fibrillation. Left ventricular diastolic function could not be evaluated.  Right Ventricle: The right ventricular size is normal. Moderately increased right ventricular wall thickness. Right ventricular systolic function is normal. There is mildly elevated  pulmonary artery systolic pressure. The tricuspid regurgitant velocity is 2.70 m/s, and with an assumed right atrial pressure of 8 mmHg, the estimated right ventricular systolic pressure is 38.7 mmHg.  Left Atrium: Left atrial size was moderately dilated.  Right Atrium: Right atrial size was normal in size.  Pericardium: A small pericardial effusion is present. The pericardial effusion is localized near the right atrium. There is no evidence of cardiac tamponade.  Mitral Valve: The mitral valve is abnormal. There is mild thickening of the anterior and posterior mitral valve leaflet(s). Trivial mitral valve regurgitation. MV peak gradient, 6.6 mmHg. The mean mitral valve gradient is 3.0 mmHg.  Tricuspid Valve: The tricuspid valve is grossly normal. Tricuspid valve regurgitation is trivial.  Aortic Valve: The aortic valve is tricuspid. Aortic valve regurgitation is trivial. Aortic valve sclerosis is present, with no evidence of aortic valve stenosis. Aortic valve mean gradient measures 5.0 mmHg. Aortic valve peak gradient measures 8.4 mmHg. Aortic valve area, by VTI measures 1.98 cm.  Pulmonic Valve: The pulmonic valve was normal in structure. Pulmonic  valve regurgitation is not visualized.  Aorta: Aortic dilatation noted. There is mild dilatation of the aortic root, measuring 40 mm.  Venous: The inferior vena cava is normal in size with less than 50% respiratory variability, suggesting right atrial pressure of 8 mmHg.  IAS/Shunts: No atrial level shunt detected by color flow Doppler.   LEFT VENTRICLE PLAX 2D LVIDd:         4.70 cm     Diastology LVIDs:         3.40 cm     LV e' medial:    4.90 cm/s LV PW:         1.80 cm     LV E/e' medial:  18.5 LV IVS:        1.70 cm     LV e' lateral:   6.53 cm/s LVOT diam:     2.00 cm     LV E/e' lateral: 13.9 LV SV:         63 LV SV Index:   37 LVOT Area:     3.14 cm  LV Volumes (MOD) LV vol d, MOD A2C: 52.9 ml LV vol d, MOD A4C: 78.5  ml LV vol s, MOD A2C: 22.9 ml LV vol s, MOD A4C: 32.2 ml LV SV MOD A2C:     30.0 ml LV SV MOD A4C:     78.5 ml LV SV MOD BP:      40.7 ml  RIGHT VENTRICLE RV Basal diam:  2.70 cm RV Mid diam:    2.60 cm RV S prime:     10.30 cm/s TAPSE (M-mode): 3.4 cm  LEFT ATRIUM              Index        RIGHT ATRIUM           Index LA diam:        4.70 cm  2.80 cm/m   RA Area:     18.90 cm LA Vol (A2C):   121.0 ml 72.02 ml/m  RA Volume:   50.40 ml  30.00 ml/m LA Vol (A4C):   78.4 ml  46.67 ml/m LA Biplane Vol: 97.4 ml  57.98 ml/m AORTIC VALVE                    PULMONIC VALVE AV Area (Vmax):    2.06 cm     PV Vmax:       0.77 m/s AV Area (Vmean):   1.85 cm     PV Peak grad:  2.4 mmHg AV Area (VTI):     1.98 cm AV Vmax:           145.00 cm/s AV Vmean:          99.000 cm/s AV VTI:            0.317 m AV Peak Grad:      8.4 mmHg AV Mean Grad:      5.0 mmHg LVOT Vmax:         95.30 cm/s LVOT Vmean:        58.300 cm/s LVOT VTI:          0.200 m LVOT/AV VTI ratio: 0.63  AORTA Ao Root diam: 4.00 cm Ao Asc diam:  4.00 cm  MITRAL VALVE                TRICUSPID VALVE MV Area (PHT): 5.93 cm     TR Peak grad:   29.2 mmHg MV Area  VTI:   2.33 cm     TR Vmax:        270.00 cm/s MV Peak grad:  6.6 mmHg MV Mean grad:  3.0 mmHg     SHUNTS MV Vmax:       1.28 m/s     Systemic VTI:  0.20 m MV Vmean:      76.6 cm/s    Systemic Diam: 2.00 cm MV Decel Time: 128 msec MV E velocity: 90.80 cm/s MV A velocity: 110.00 cm/s MV E/A ratio:  0.83  Lyman Bishop MD Electronically signed by Lyman Bishop MD Signature Date/Time: 10/27/2021/5:30:06 PM    Final         Risk Assessment/Calculations/Metrics:   {Does this patient have ATRIAL FIBRILLATION?[phone]}     No BP recorded.  {Refresh Note OR Click here to enter BP  :1}***    Physical Exam:    VS:  There were no vitals taken for this visit.    Wt Readings from Last 3 Encounters:  01/19/22 207 lb (93.9 kg)  01/08/22 207 lb 3.7  oz (94 kg)  12/17/21 164 lb 3.2 oz (74.5 kg)    Physical Exam  GEN: Well nourished, well developed, in no acute distress  HEENT: normal  Neck: no JVD, carotid bruits, or masses Cardiac:RRR; no murmurs, rubs, or gallops  Respiratory:  clear to auscultation bilaterally, normal work of breathing GI: soft, nontender, nondistended, + BS Ext: without cyanosis, clubbing, or edema, Good distal pulses bilaterally MS: no deformity or atrophy  Skin: warm and dry, no rash Neuro:  Alert and Oriented x 3, Strength and sensation are intact Psych: euthymic mood, full affect       ASSESSMENT & PLAN:   No problem-specific Assessment & Plan notes found for this encounter.  HTN  History of SVT in setting glucose >800  In hospital cardiac arrest in setting of hypertensive emergency and hyperglycemia echo normal lvef.      {Are you ordering a CV Procedure (e.g. stress test, cath, DCCV, TEE, etc)?   Press F2        :[id number]}   Dispo:  No follow-ups on file.   Medication Adjustments/Labs and Tests Ordered: Current medicines are reviewed at length with the patient today.  Concerns regarding medicines are outlined above.  Tests Ordered: No orders of the defined types were placed in this encounter.  Medication Changes: No orders of the defined types were placed in this encounter.  Signed, Ermalinda Barrios, PA-C  01/23/2022 3:09 PM    [place] [address], [place], [place]  [address] Phone: [phone]; Fax: [phone]

## 2022-01-25 DIAGNOSIS — M6281 Muscle weakness (generalized): Secondary | ICD-10-CM | POA: Diagnosis not present

## 2022-01-25 DIAGNOSIS — E119 Type 2 diabetes mellitus without complications: Secondary | ICD-10-CM | POA: Diagnosis not present

## 2022-01-25 DIAGNOSIS — N179 Acute kidney failure, unspecified: Secondary | ICD-10-CM | POA: Diagnosis not present

## 2022-01-25 DIAGNOSIS — N189 Chronic kidney disease, unspecified: Secondary | ICD-10-CM | POA: Diagnosis not present

## 2022-01-25 DIAGNOSIS — Z992 Dependence on renal dialysis: Secondary | ICD-10-CM | POA: Diagnosis not present

## 2022-01-25 DIAGNOSIS — N186 End stage renal disease: Secondary | ICD-10-CM | POA: Diagnosis not present

## 2022-01-25 DIAGNOSIS — I509 Heart failure, unspecified: Secondary | ICD-10-CM | POA: Diagnosis not present

## 2022-01-25 DIAGNOSIS — I10 Essential (primary) hypertension: Secondary | ICD-10-CM | POA: Diagnosis not present

## 2022-01-25 DIAGNOSIS — Z23 Encounter for immunization: Secondary | ICD-10-CM | POA: Diagnosis not present

## 2022-01-27 DIAGNOSIS — N189 Chronic kidney disease, unspecified: Secondary | ICD-10-CM | POA: Diagnosis not present

## 2022-01-27 DIAGNOSIS — Z23 Encounter for immunization: Secondary | ICD-10-CM | POA: Diagnosis not present

## 2022-01-27 DIAGNOSIS — N179 Acute kidney failure, unspecified: Secondary | ICD-10-CM | POA: Diagnosis not present

## 2022-01-28 DIAGNOSIS — Z992 Dependence on renal dialysis: Secondary | ICD-10-CM | POA: Diagnosis not present

## 2022-01-28 DIAGNOSIS — N186 End stage renal disease: Secondary | ICD-10-CM | POA: Diagnosis not present

## 2022-01-30 ENCOUNTER — Ambulatory Visit: Payer: Medicare Other | Admitting: Physician Assistant

## 2022-01-30 DIAGNOSIS — Z23 Encounter for immunization: Secondary | ICD-10-CM | POA: Diagnosis not present

## 2022-01-30 DIAGNOSIS — N179 Acute kidney failure, unspecified: Secondary | ICD-10-CM | POA: Diagnosis not present

## 2022-01-30 DIAGNOSIS — N189 Chronic kidney disease, unspecified: Secondary | ICD-10-CM | POA: Diagnosis not present

## 2022-01-31 ENCOUNTER — Ambulatory Visit: Payer: Medicare Other | Admitting: Nurse Practitioner

## 2022-02-01 DIAGNOSIS — N189 Chronic kidney disease, unspecified: Secondary | ICD-10-CM | POA: Diagnosis not present

## 2022-02-01 DIAGNOSIS — N179 Acute kidney failure, unspecified: Secondary | ICD-10-CM | POA: Diagnosis not present

## 2022-02-01 DIAGNOSIS — Z23 Encounter for immunization: Secondary | ICD-10-CM | POA: Diagnosis not present

## 2022-02-03 DIAGNOSIS — E119 Type 2 diabetes mellitus without complications: Secondary | ICD-10-CM | POA: Diagnosis not present

## 2022-02-03 DIAGNOSIS — N179 Acute kidney failure, unspecified: Secondary | ICD-10-CM | POA: Diagnosis not present

## 2022-02-03 DIAGNOSIS — Z992 Dependence on renal dialysis: Secondary | ICD-10-CM | POA: Diagnosis not present

## 2022-02-03 DIAGNOSIS — M6281 Muscle weakness (generalized): Secondary | ICD-10-CM | POA: Diagnosis not present

## 2022-02-03 DIAGNOSIS — I10 Essential (primary) hypertension: Secondary | ICD-10-CM | POA: Diagnosis not present

## 2022-02-03 DIAGNOSIS — N189 Chronic kidney disease, unspecified: Secondary | ICD-10-CM | POA: Diagnosis not present

## 2022-02-03 DIAGNOSIS — Z23 Encounter for immunization: Secondary | ICD-10-CM | POA: Diagnosis not present

## 2022-02-03 DIAGNOSIS — N186 End stage renal disease: Secondary | ICD-10-CM | POA: Diagnosis not present

## 2022-02-03 DIAGNOSIS — I509 Heart failure, unspecified: Secondary | ICD-10-CM | POA: Diagnosis not present

## 2022-02-06 DIAGNOSIS — N179 Acute kidney failure, unspecified: Secondary | ICD-10-CM | POA: Diagnosis not present

## 2022-02-06 DIAGNOSIS — N189 Chronic kidney disease, unspecified: Secondary | ICD-10-CM | POA: Diagnosis not present

## 2022-02-06 DIAGNOSIS — Z23 Encounter for immunization: Secondary | ICD-10-CM | POA: Diagnosis not present

## 2022-02-08 DIAGNOSIS — N179 Acute kidney failure, unspecified: Secondary | ICD-10-CM | POA: Diagnosis not present

## 2022-02-08 DIAGNOSIS — Z23 Encounter for immunization: Secondary | ICD-10-CM | POA: Diagnosis not present

## 2022-02-08 DIAGNOSIS — N189 Chronic kidney disease, unspecified: Secondary | ICD-10-CM | POA: Diagnosis not present

## 2022-02-10 DIAGNOSIS — E119 Type 2 diabetes mellitus without complications: Secondary | ICD-10-CM | POA: Diagnosis not present

## 2022-02-10 DIAGNOSIS — N179 Acute kidney failure, unspecified: Secondary | ICD-10-CM | POA: Diagnosis not present

## 2022-02-10 DIAGNOSIS — Z23 Encounter for immunization: Secondary | ICD-10-CM | POA: Diagnosis not present

## 2022-02-10 DIAGNOSIS — M6281 Muscle weakness (generalized): Secondary | ICD-10-CM | POA: Diagnosis not present

## 2022-02-10 DIAGNOSIS — I509 Heart failure, unspecified: Secondary | ICD-10-CM | POA: Diagnosis not present

## 2022-02-10 DIAGNOSIS — I10 Essential (primary) hypertension: Secondary | ICD-10-CM | POA: Diagnosis not present

## 2022-02-10 DIAGNOSIS — Z992 Dependence on renal dialysis: Secondary | ICD-10-CM | POA: Diagnosis not present

## 2022-02-10 DIAGNOSIS — N189 Chronic kidney disease, unspecified: Secondary | ICD-10-CM | POA: Diagnosis not present

## 2022-02-10 DIAGNOSIS — N186 End stage renal disease: Secondary | ICD-10-CM | POA: Diagnosis not present

## 2022-02-13 DIAGNOSIS — Z23 Encounter for immunization: Secondary | ICD-10-CM | POA: Diagnosis not present

## 2022-02-13 DIAGNOSIS — N179 Acute kidney failure, unspecified: Secondary | ICD-10-CM | POA: Diagnosis not present

## 2022-02-13 DIAGNOSIS — N189 Chronic kidney disease, unspecified: Secondary | ICD-10-CM | POA: Diagnosis not present

## 2022-02-15 DIAGNOSIS — N184 Chronic kidney disease, stage 4 (severe): Secondary | ICD-10-CM | POA: Diagnosis not present

## 2022-02-15 DIAGNOSIS — I482 Chronic atrial fibrillation, unspecified: Secondary | ICD-10-CM | POA: Diagnosis not present

## 2022-02-15 DIAGNOSIS — N179 Acute kidney failure, unspecified: Secondary | ICD-10-CM | POA: Diagnosis not present

## 2022-02-15 DIAGNOSIS — Z23 Encounter for immunization: Secondary | ICD-10-CM | POA: Diagnosis not present

## 2022-02-15 DIAGNOSIS — N189 Chronic kidney disease, unspecified: Secondary | ICD-10-CM | POA: Diagnosis not present

## 2022-02-17 DIAGNOSIS — N179 Acute kidney failure, unspecified: Secondary | ICD-10-CM | POA: Diagnosis not present

## 2022-02-17 DIAGNOSIS — Z23 Encounter for immunization: Secondary | ICD-10-CM | POA: Diagnosis not present

## 2022-02-17 DIAGNOSIS — N189 Chronic kidney disease, unspecified: Secondary | ICD-10-CM | POA: Diagnosis not present

## 2022-02-20 DIAGNOSIS — N179 Acute kidney failure, unspecified: Secondary | ICD-10-CM | POA: Diagnosis not present

## 2022-02-20 DIAGNOSIS — Z23 Encounter for immunization: Secondary | ICD-10-CM | POA: Diagnosis not present

## 2022-02-20 DIAGNOSIS — L0231 Cutaneous abscess of buttock: Secondary | ICD-10-CM | POA: Diagnosis not present

## 2022-02-20 DIAGNOSIS — N189 Chronic kidney disease, unspecified: Secondary | ICD-10-CM | POA: Diagnosis not present

## 2022-02-22 DIAGNOSIS — N189 Chronic kidney disease, unspecified: Secondary | ICD-10-CM | POA: Diagnosis not present

## 2022-02-22 DIAGNOSIS — Z23 Encounter for immunization: Secondary | ICD-10-CM | POA: Diagnosis not present

## 2022-02-22 DIAGNOSIS — N179 Acute kidney failure, unspecified: Secondary | ICD-10-CM | POA: Diagnosis not present

## 2022-02-24 DIAGNOSIS — N179 Acute kidney failure, unspecified: Secondary | ICD-10-CM | POA: Diagnosis not present

## 2022-02-24 DIAGNOSIS — Z23 Encounter for immunization: Secondary | ICD-10-CM | POA: Diagnosis not present

## 2022-02-24 DIAGNOSIS — N189 Chronic kidney disease, unspecified: Secondary | ICD-10-CM | POA: Diagnosis not present

## 2022-02-27 DIAGNOSIS — N179 Acute kidney failure, unspecified: Secondary | ICD-10-CM | POA: Diagnosis not present

## 2022-02-27 DIAGNOSIS — N189 Chronic kidney disease, unspecified: Secondary | ICD-10-CM | POA: Diagnosis not present

## 2022-02-27 DIAGNOSIS — Z23 Encounter for immunization: Secondary | ICD-10-CM | POA: Diagnosis not present

## 2022-02-28 DIAGNOSIS — N186 End stage renal disease: Secondary | ICD-10-CM | POA: Diagnosis not present

## 2022-02-28 DIAGNOSIS — Z992 Dependence on renal dialysis: Secondary | ICD-10-CM | POA: Diagnosis not present

## 2022-03-01 DIAGNOSIS — N179 Acute kidney failure, unspecified: Secondary | ICD-10-CM | POA: Diagnosis not present

## 2022-03-01 DIAGNOSIS — I10 Essential (primary) hypertension: Secondary | ICD-10-CM | POA: Diagnosis not present

## 2022-03-01 DIAGNOSIS — E119 Type 2 diabetes mellitus without complications: Secondary | ICD-10-CM | POA: Diagnosis not present

## 2022-03-01 DIAGNOSIS — I4891 Unspecified atrial fibrillation: Secondary | ICD-10-CM | POA: Diagnosis not present

## 2022-03-01 DIAGNOSIS — I509 Heart failure, unspecified: Secondary | ICD-10-CM | POA: Diagnosis not present

## 2022-03-01 DIAGNOSIS — M79673 Pain in unspecified foot: Secondary | ICD-10-CM | POA: Diagnosis not present

## 2022-03-01 DIAGNOSIS — E785 Hyperlipidemia, unspecified: Secondary | ICD-10-CM | POA: Diagnosis not present

## 2022-03-01 DIAGNOSIS — N189 Chronic kidney disease, unspecified: Secondary | ICD-10-CM | POA: Diagnosis not present

## 2022-03-01 DIAGNOSIS — E1122 Type 2 diabetes mellitus with diabetic chronic kidney disease: Secondary | ICD-10-CM | POA: Diagnosis not present

## 2022-03-03 DIAGNOSIS — N189 Chronic kidney disease, unspecified: Secondary | ICD-10-CM | POA: Diagnosis not present

## 2022-03-03 DIAGNOSIS — N179 Acute kidney failure, unspecified: Secondary | ICD-10-CM | POA: Diagnosis not present

## 2022-03-03 NOTE — Progress Notes (Signed)
Cardiology Office Note:    Date:  03/14/2022   ID:  Chelsea Meza, DOB 1935/09/02, MRN [id number]  PCP:  Hal Morales, [place] [facility] Cardiologist:  Carlyle Dolly, MD     Referring MD: Hal Morales, *   Chief Complaint:  Hospitalization Follow-up     History of Present Illness:   Chelsea Meza is a 86 y.o. female with   with a hx of DM, HTN, HLD, CKD IV, TIA, PSVT. Was seen by [place] in the hospital 09/2018 with SVT in setting of glucose over 800. She had compliance issues with Verapamil. Echo 10/20/19 LVEF 60-65% mod LV wall thickness, impaired relaxation, smallcircumferential pericardial effusion. Holter monitor ordered by PCP 09/2019 but never done.     Patient admitted 04/04/20 with HTN (229/107)and hyperglycemia with BG>600 on EMS 800 on admission treated with IV fluids and IV insulin. Both have been elevated since Thanksgiving. Patient ran out of meds for 2 weeks   Admission Na 119 lactic acidosis 2.8.She became nauseated and  had cardiac arrest with ROSC.She became hypotensive with BP 67/44, lost of pulse tansiently treated with atropine 1 mg and epi 1 mg IV then became obtunded and CPR 3-4 min. Monitor showed VT followed by bradycardia with ROSC. She became hypotensive again treated with fluid bolus, levophed and epinephrine. EKG had ant ST elevation that resolved on EKG today.Unclear etiology of her arrest. At this time no tele evidence of VT or VF, this looks to qualify as a  PEA arrest. Post arrest EKG ST changes not consistent with ACS. Echocardiogram shows vigorous LVEF at 65 to 70% with severe LVH and mild diastolic dysfunction, normal RV contraction, trivial pericardial effusion.  Patient hasn't been seen by [place] since 2021. Now on dialysis and palliative care.   Patient discharged 01/08/22 with altered mental status,  hypoglycemia, hypothermia, AKI and CHF. She was started on dialysis. Review of records show new onset Afib 09/2021 and  started on eliquis. Was supposed to f/u with [place] but never happened.   Patient comes in today alone from SNF. She denies chest pain, dyspnea, palpitations, swelling.  Tolerating dialysis so far. Doesn't feel the afib. No bleeding problems on renal dose eliquis. Chronic anemia. Echo 09/2021 reviewed and shows concerns for amyloid with severe LVH.        Past Medical History:  Diagnosis Date   Anemia    Chronic atrial fibrillation ([name])    CKD (chronic kidney disease) stage 3, GFR 30-59 ml/min ([name])    Diabetes mellitus without complication ([name])    Hyperlipidemia    Hypertension    Localized edema    Other specified disorders of thyroid    TIA (transient ischemic attack)    Unspecified convulsions ([name])    Current Medications: Current Meds  Medication Sig   Accu-Chek FastClix Lancets MISC    ACCU-CHEK GUIDE test strip USE TO TEST TWICE DAILY.AL   acetaminophen (TYLENOL) 325 MG tablet Take 2 tablets (650 mg total) by mouth every 6 (six) hours as needed for mild pain, fever or headache (or Fever >/= 101).   amLODipine (NORVASC) 10 MG tablet Take 1 tablet (10 mg total) by mouth daily.   apixaban (ELIQUIS) 2.5 MG TABS tablet Take 1 tablet (2.5 mg total) by mouth 2 (two) times daily.   atorvastatin (LIPITOR) 40 MG tablet Take 40 mg by mouth at bedtime.   Blood Glucose Monitoring Suppl (ACCU-CHEK GUIDE) w/Device KIT USE AS DIRECTED.I   carvedilol (COREG) 3.125 MG  tablet Take 1 tablet (3.125 mg total) by mouth 2 (two) times daily with a meal.   Darbepoetin Alfa (ARANESP) 200 MCG/0.4ML SOSY injection Inject 0.4 mLs (200 mcg total) into the skin every 7 (seven) days.   feeding supplement (ENSURE ENLIVE / ENSURE PLUS) LIQD Take 237 mLs by mouth 2 (two) times daily between meals.   furosemide (LASIX) 40 MG tablet Take 1 tablet (40 mg total) by mouth daily.   GVOKE PFS 0.5 MG/0.1ML SOSY Inject 0.5 mg into the skin as needed (low bs).   hydrALAZINE (APRESOLINE) 100 MG tablet Take 1 tablet (100 mg  total) by mouth 3 (three) times daily. (Patient taking differently: Take 100 mg by mouth 2 (two) times daily.)   insulin aspart (NOVOLOG) 100 UNIT/ML injection NovoLog insulin per sliding scale glucose 0-120--no units; 121 to 150-1 unit; 151 to 200-2 units; 201 to 250-3 units; 251 to 300-5 units; 30 to 350-7 units; 351 to 400-9 units   Insulin Pen Needle (PEN NEEDLES) 32G X 4 MM MISC Use as directed   iron sucrose 100 mg in sodium chloride 0.9 % 100 mL Inject 100 mg into the vein every Monday, Wednesday, and Friday with hemodialysis.   lidocaine (LIDODERM) 5 % Place 1 patch onto the skin daily. Remove & Discard patch within 12 hours or as directed by MD   sodium zirconium cyclosilicate (LOKELMA) 10 g PACK packet Take 10 g by mouth every Monday, Wednesday, and Friday.    Allergies:   Patient has no known allergies.   Social History   Tobacco Use   Smoking status: Never   Smokeless tobacco: Never  Vaping Use   Vaping Use: Never used  Substance Use Topics   Alcohol use: No   Drug use: No    Family Hx: The patient's family history includes Stroke in her maternal grandmother and mother.  ROS     Physical Exam:    VS:  BP 132/62   Pulse 86   Ht _0  (1.753 m)   BMI 30.57 kg/m     Wt Readings from Last 3 Encounters:  01/19/22 207 lb (93.9 kg)  01/08/22 207 lb 3.7 oz (94 kg)  12/17/21 164 lb 3.2 oz (74.5 kg)    Physical Exam  GEN: Obese, in no acute distress  Neck: no JVD, carotid bruits, or masses Cardiac:irreg irreg 2-1/9 systolic murmur LSB Respiratory:  clear to auscultation bilaterally, normal work of breathing GI: soft, nontender, nondistended, + BS Ext: compression hose in place, chronic ankle edema, without cyanosis, clubbing,, Good distal pulses bilaterally Neuro:  Alert and Oriented x 3,  Psych: euthymic mood, full affect        EKGs/Labs/Other Test Reviewed:    EKG:  EKG is  not ordered today.  The EKG's reviewed from last few hospitalizations show Afib  controlled rate.  Recent Labs: 12/16/2021: Magnesium 2.2 12/21/2021: ALT 21 12/27/2021: B Natriuretic Peptide 87.0; TSH 9.327 01/04/2022: Hemoglobin 8.5; Platelets 282 01/06/2022: BUN 78; Creatinine, Ser 3.55; Potassium 3.8; Sodium 134   Recent Lipid Panel No results for input(s): "CHOL", "TRIG", "HDL", "VLDL", "LDLCALC", "LDLDIRECT" in the last 8760 hours.   Prior CV Studies:  Echo 09/2021     ECHOCARDIOGRAM REPORT       Patient Name:   Chelsea Meza Date of Exam: 10/27/2021  Medical Rec #:  [id number]         Height:       70.0 in  Accession #:    [id number]  Weight:       120.0 lb  Date of Birth:  [birth year]         BSA:          1.680 m  Patient Age:    46 years          BP:           196/115 mmHg  Patient Gender: F                 HR:           77 bpm.  Exam Location:  [facility]   Procedure: 2D Echo, Cardiac Doppler and Color Doppler   Indications:    Atrial fibrillation    History:        Patient has prior history of Echocardiogram examinations,  most                 recent 04/05/2020. CHF, Previous Myocardial Infarction,  TIA,                 Arrythmias:Atrial Fibrillation and Tachycardia; Risk                  Factors:Hypertension, Diabetes and Dyslipidemia.    Sonographer:    Wenda Low  Referring Phys: [id number] [place]     1. Echocardiographic features concerning for infiltrative disease such as  amyloidosis. Left ventricular ejection fraction, by estimation, is 60 to  65%. The left ventricle has normal function. The left ventricle has no  regional wall motion abnormalities.   There is severe left ventricular hypertrophy. Left ventricular diastolic  function could not be evaluated.   2. Right ventricular systolic function is normal. The right ventricular  size is normal. Moderately increased right ventricular wall thickness.  There is mildly elevated pulmonary artery systolic pressure. The estimated  right ventricular systolic  pressure   is 54.5 mmHg.   3. Left atrial size was moderately dilated.   4. A small pericardial effusion is present. The pericardial effusion is  localized near the right atrium. There is no evidence of cardiac  tamponade.   5. The mitral valve is abnormal. Trivial mitral valve regurgitation.   6. The aortic valve is tricuspid. Aortic valve regurgitation is trivial.  Aortic valve sclerosis is present, with no evidence of aortic valve  stenosis.   7. Aortic dilatation noted. There is mild dilatation of the aortic root,  measuring 40 mm.   8. The inferior vena cava is normal in size with <50% respiratory  variability, suggesting right atrial pressure of 8 mmHg.   Comparison(s): Changes from prior study are noted. 04/05/2020: LVEF 65-70%,  severe LVH, pericardial effusion.   Conclusion(s)/Recommendation(s): Would consider evaluation for possible  cardiac amyloidosis with PYP scan and/or cMRI.    ECHO COMPLETE WO IMAGING ENHANCING AGENT 10/27/2021  Narrative ECHOCARDIOGRAM REPORT    Patient Name:   Chelsea Meza Date of Exam: 10/27/2021 Medical Rec #:  [id number]         Height:       70.0 in Accession #:    [id number]        Weight:       120.0 lb Date of Birth:  10-Jun-1935         BSA:          1.680 m Patient Age:    12 years          BP:  196/115 mmHg Patient Gender: F                 HR:           77 bpm. Exam Location:  [facility]  Procedure: 2D Echo, Cardiac Doppler and Color Doppler  Indications:    Atrial fibrillation  History:        Patient has prior history of Echocardiogram examinations, most recent 04/05/2020. CHF, Previous Myocardial Infarction, TIA, Arrythmias:Atrial Fibrillation and Tachycardia; Risk Factors:Hypertension, Diabetes and Dyslipidemia.  Sonographer:    Wenda Low Referring Phys: [id number] [place]   1. Echocardiographic features concerning for infiltrative disease such as amyloidosis. Left ventricular ejection  fraction, by estimation, is 60 to 65%. The left ventricle has normal function. The left ventricle has no regional wall motion abnormalities. There is severe left ventricular hypertrophy. Left ventricular diastolic function could not be evaluated. 2. Right ventricular systolic function is normal. The right ventricular size is normal. Moderately increased right ventricular wall thickness. There is mildly elevated pulmonary artery systolic pressure. The estimated right ventricular systolic pressure is 84.1 mmHg. 3. Left atrial size was moderately dilated. 4. A small pericardial effusion is present. The pericardial effusion is localized near the right atrium. There is no evidence of cardiac tamponade. 5. The mitral valve is abnormal. Trivial mitral valve regurgitation. 6. The aortic valve is tricuspid. Aortic valve regurgitation is trivial. Aortic valve sclerosis is present, with no evidence of aortic valve stenosis. 7. Aortic dilatation noted. There is mild dilatation of the aortic root, measuring 40 mm. 8. The inferior vena cava is normal in size with <50% respiratory variability, suggesting right atrial pressure of 8 mmHg.  Comparison(s): Changes from prior study are noted. 04/05/2020: LVEF 65-70%, severe LVH, pericardial effusion.  Conclusion(s)/Recommendation(s): Would consider evaluation for possible cardiac amyloidosis with PYP scan and/or cMRI.  FINDINGS Left Ventricle: Echocardiographic features concerning for infiltrative disease such as amyloidosis. Left ventricular ejection fraction, by estimation, is 60 to 65%. The left ventricle has normal function. The left ventricle has no regional wall motion abnormalities. The left ventricular internal cavity size was normal in size. There is severe left ventricular hypertrophy. Left ventricular diastolic function could not be evaluated due to atrial fibrillation. Left ventricular diastolic function could not be evaluated.  Right Ventricle: The right  ventricular size is normal. Moderately increased right ventricular wall thickness. Right ventricular systolic function is normal. There is mildly elevated pulmonary artery systolic pressure. The tricuspid regurgitant velocity is 2.70 m/s, and with an assumed right atrial pressure of 8 mmHg, the estimated right ventricular systolic pressure is 32.4 mmHg.  Left Atrium: Left atrial size was moderately dilated.  Right Atrium: Right atrial size was normal in size.  Pericardium: A small pericardial effusion is present. The pericardial effusion is localized near the right atrium. There is no evidence of cardiac tamponade.  Mitral Valve: The mitral valve is abnormal. There is mild thickening of the anterior and posterior mitral valve leaflet(s). Trivial mitral valve regurgitation. MV peak gradient, 6.6 mmHg. The mean mitral valve gradient is 3.0 mmHg.  Tricuspid Valve: The tricuspid valve is grossly normal. Tricuspid valve regurgitation is trivial.  Aortic Valve: The aortic valve is tricuspid. Aortic valve regurgitation is trivial. Aortic valve sclerosis is present, with no evidence of aortic valve stenosis. Aortic valve mean gradient measures 5.0 mmHg. Aortic valve peak gradient measures 8.4 mmHg. Aortic valve area, by VTI measures 1.98 cm.  Pulmonic Valve: The pulmonic valve was normal in structure. Pulmonic  valve regurgitation is not visualized.  Aorta: Aortic dilatation noted. There is mild dilatation of the aortic root, measuring 40 mm.  Venous: The inferior vena cava is normal in size with less than 50% respiratory variability, suggesting right atrial pressure of 8 mmHg.  IAS/Shunts: No atrial level shunt detected by color flow Doppler.   LEFT VENTRICLE PLAX 2D LVIDd:         4.70 cm     Diastology LVIDs:         3.40 cm     LV e' medial:    4.90 cm/s LV PW:         1.80 cm     LV E/e' medial:  18.5 LV IVS:        1.70 cm     LV e' lateral:   6.53 cm/s LVOT diam:     2.00 cm     LV E/e'  lateral: 13.9 LV SV:         63 LV SV Index:   37 LVOT Area:     3.14 cm  LV Volumes (MOD) LV vol d, MOD A2C: 52.9 ml LV vol d, MOD A4C: 78.5 ml LV vol s, MOD A2C: 22.9 ml LV vol s, MOD A4C: 32.2 ml LV SV MOD A2C:     30.0 ml LV SV MOD A4C:     78.5 ml LV SV MOD BP:      40.7 ml  RIGHT VENTRICLE RV Basal diam:  2.70 cm RV Mid diam:    2.60 cm RV S prime:     10.30 cm/s TAPSE (M-mode): 3.4 cm  LEFT ATRIUM              Index        RIGHT ATRIUM           Index LA diam:        4.70 cm  2.80 cm/m   RA Area:     18.90 cm LA Vol (A2C):   121.0 ml 72.02 ml/m  RA Volume:   50.40 ml  30.00 ml/m LA Vol (A4C):   78.4 ml  46.67 ml/m LA Biplane Vol: 97.4 ml  57.98 ml/m AORTIC VALVE                    PULMONIC VALVE AV Area (Vmax):    2.06 cm     PV Vmax:       0.77 m/s AV Area (Vmean):   1.85 cm     PV Peak grad:  2.4 mmHg AV Area (VTI):     1.98 cm AV Vmax:           145.00 cm/s AV Vmean:          99.000 cm/s AV VTI:            0.317 m AV Peak Grad:      8.4 mmHg AV Mean Grad:      5.0 mmHg LVOT Vmax:         95.30 cm/s LVOT Vmean:        58.300 cm/s LVOT VTI:          0.200 m LVOT/AV VTI ratio: 0.63  AORTA Ao Root diam: 4.00 cm Ao Asc diam:  4.00 cm  MITRAL VALVE                TRICUSPID VALVE MV Area (PHT): 5.93 cm     TR Peak grad:   29.2 mmHg MV Area  VTI:   2.33 cm     TR Vmax:        270.00 cm/s MV Peak grad:  6.6 mmHg MV Mean grad:  3.0 mmHg     SHUNTS MV Vmax:       1.28 m/s     Systemic VTI:  0.20 m MV Vmean:      76.6 cm/s    Systemic Diam: 2.00 cm MV Decel Time: 128 msec MV E velocity: 90.80 cm/s MV A velocity: 110.00 cm/s MV E/A ratio:  0.83  Lyman Bishop MD Electronically signed by Lyman Bishop MD Signature Date/Time: 10/27/2021/5:30:06 PM    Final        Risk Assessment/Calculations/Metrics:    CHA2DS2-VASc Score = 8   This indicates a 10.8% annual risk of stroke. The patient's score is based upon: CHF History: 1 HTN History:  1 Diabetes History: 1 Stroke History: 2 Vascular Disease History: 0 Age Score: 2 Gender Score: 1             ASSESSMENT & PLAN:   No problem-specific Assessment & Plan notes found for this encounter.   Atrial fibrillation, persistent since 09/2021 on eliquis 2.5 mg bid based on age, renal, rate controlled on low dose coreg 3.125 mg bid  HTN with severe LVH and concern for amyloid on echo 09/2021 which was new. No recent syncope. Given age, recent dialysis, palliative care will hold off on MRI for further workup at this time. BP well controlled on hydralazine, amlodipine, coreg, lasix  HLD on atorvastatin managed by PCP  ESRD ON HD  History of TIA            Dispo:  No follow-ups on file.   Medication Adjustments/Labs and Tests Ordered: Current medicines are reviewed at length with the patient today.  Concerns regarding medicines are outlined above.  Tests Ordered: No orders of the defined types were placed in this encounter.  Medication Changes: No orders of the defined types were placed in this encounter.  Signed, Ermalinda Barrios, PA-C  03/14/2022 11:35 AM    [address] [place], [place], [place]  [address] Phone: [phone]; Fax: [phone]

## 2022-03-06 DIAGNOSIS — N189 Chronic kidney disease, unspecified: Secondary | ICD-10-CM | POA: Diagnosis not present

## 2022-03-06 DIAGNOSIS — N179 Acute kidney failure, unspecified: Secondary | ICD-10-CM | POA: Diagnosis not present

## 2022-03-08 ENCOUNTER — Non-Acute Institutional Stay: Payer: Medicare Other

## 2022-03-08 ENCOUNTER — Telehealth: Payer: Self-pay

## 2022-03-08 DIAGNOSIS — Z515 Encounter for palliative care: Secondary | ICD-10-CM

## 2022-03-08 NOTE — Progress Notes (Signed)
PATIENT NAME: Chelsea Meza DOB: 1935-09-07 MRN: [id number]  PRIMARY CARE PROVIDER: Hal Morales, DO  RESPONSIBLE PARTY:  Acct ID - Guarantor Home Phone Work Phone Relationship Acct Type  [id number] Satira [phone]  Self P/F     [address] [place], [place] [address]   Connected [name], NP virtually with patient.  Patient advises she was scheduled for dialysis today but did not go.  She has been having issues with constipation and was given medication yesterday.  Patient is now having multiple stools and did not feel she could do dialysis.  See NP note for complete assessment.  Phone call made to daughter [name] to follow up on PC visit.  No answer.  VM is not set up and unable to leave a message.      [name], RN

## 2022-03-08 NOTE — Telephone Encounter (Signed)
140 pm.  Incoming call from daughter Jermia Rigsby.  Advised of PC referral and patient being seen in September.  Discussed if daughter would want PC to continue following.  At this time, daughter would like patient discharged from [facility].  Advised if she would like to restart our services, order could be obtained from provider at the facility.

## 2022-03-10 DIAGNOSIS — N179 Acute kidney failure, unspecified: Secondary | ICD-10-CM | POA: Diagnosis not present

## 2022-03-10 DIAGNOSIS — N189 Chronic kidney disease, unspecified: Secondary | ICD-10-CM | POA: Diagnosis not present

## 2022-03-13 DIAGNOSIS — N179 Acute kidney failure, unspecified: Secondary | ICD-10-CM | POA: Diagnosis not present

## 2022-03-13 DIAGNOSIS — N189 Chronic kidney disease, unspecified: Secondary | ICD-10-CM | POA: Diagnosis not present

## 2022-03-14 ENCOUNTER — Encounter: Payer: Self-pay | Admitting: Physician Assistant

## 2022-03-14 ENCOUNTER — Ambulatory Visit: Payer: Medicare Other | Attending: Physician Assistant | Admitting: Physician Assistant

## 2022-03-14 VITALS — BP 132/62 | HR 86 | Ht 69.0 in

## 2022-03-14 DIAGNOSIS — N186 End stage renal disease: Secondary | ICD-10-CM

## 2022-03-14 DIAGNOSIS — I4819 Other persistent atrial fibrillation: Secondary | ICD-10-CM

## 2022-03-14 DIAGNOSIS — Z992 Dependence on renal dialysis: Secondary | ICD-10-CM

## 2022-03-14 DIAGNOSIS — I10 Essential (primary) hypertension: Secondary | ICD-10-CM

## 2022-03-14 DIAGNOSIS — I110 Hypertensive heart disease with heart failure: Secondary | ICD-10-CM

## 2022-03-14 DIAGNOSIS — I4891 Unspecified atrial fibrillation: Secondary | ICD-10-CM | POA: Diagnosis not present

## 2022-03-14 DIAGNOSIS — E785 Hyperlipidemia, unspecified: Secondary | ICD-10-CM

## 2022-03-14 DIAGNOSIS — E875 Hyperkalemia: Secondary | ICD-10-CM | POA: Diagnosis not present

## 2022-03-14 DIAGNOSIS — E1122 Type 2 diabetes mellitus with diabetic chronic kidney disease: Secondary | ICD-10-CM | POA: Diagnosis not present

## 2022-03-14 DIAGNOSIS — Z8673 Personal history of transient ischemic attack (TIA), and cerebral infarction without residual deficits: Secondary | ICD-10-CM | POA: Diagnosis not present

## 2022-03-14 DIAGNOSIS — Z794 Long term (current) use of insulin: Secondary | ICD-10-CM | POA: Diagnosis not present

## 2022-03-14 DIAGNOSIS — E1121 Type 2 diabetes mellitus with diabetic nephropathy: Secondary | ICD-10-CM | POA: Diagnosis not present

## 2022-03-14 NOTE — Patient Instructions (Signed)
Medication Instructions:  Your physician recommends that you continue on your current medications as directed. Please refer to the Current Medication list given to you today.   Labwork: None today  Testing/Procedures: None today  Follow-Up: 6 months  Any Other Special Instructions Will Be Listed Below (If Applicable).  If you need a refill on your cardiac medications before your next appointment, please call your pharmacy.  

## 2022-03-15 DIAGNOSIS — N189 Chronic kidney disease, unspecified: Secondary | ICD-10-CM | POA: Diagnosis not present

## 2022-03-15 DIAGNOSIS — N179 Acute kidney failure, unspecified: Secondary | ICD-10-CM | POA: Diagnosis not present

## 2022-03-17 DIAGNOSIS — E119 Type 2 diabetes mellitus without complications: Secondary | ICD-10-CM | POA: Diagnosis not present

## 2022-03-17 DIAGNOSIS — Z794 Long term (current) use of insulin: Secondary | ICD-10-CM | POA: Diagnosis not present

## 2022-03-17 DIAGNOSIS — N186 End stage renal disease: Secondary | ICD-10-CM | POA: Diagnosis not present

## 2022-03-17 DIAGNOSIS — Z13220 Encounter for screening for lipoid disorders: Secondary | ICD-10-CM | POA: Diagnosis not present

## 2022-03-17 DIAGNOSIS — Z992 Dependence on renal dialysis: Secondary | ICD-10-CM | POA: Diagnosis not present

## 2022-03-20 DIAGNOSIS — Z992 Dependence on renal dialysis: Secondary | ICD-10-CM | POA: Diagnosis not present

## 2022-03-20 DIAGNOSIS — N186 End stage renal disease: Secondary | ICD-10-CM | POA: Diagnosis not present

## 2022-03-22 DIAGNOSIS — M6281 Muscle weakness (generalized): Secondary | ICD-10-CM | POA: Diagnosis not present

## 2022-03-22 DIAGNOSIS — N189 Chronic kidney disease, unspecified: Secondary | ICD-10-CM | POA: Diagnosis not present

## 2022-03-22 DIAGNOSIS — N186 End stage renal disease: Secondary | ICD-10-CM | POA: Diagnosis not present

## 2022-03-22 DIAGNOSIS — N184 Chronic kidney disease, stage 4 (severe): Secondary | ICD-10-CM | POA: Diagnosis not present

## 2022-03-22 DIAGNOSIS — I10 Essential (primary) hypertension: Secondary | ICD-10-CM | POA: Diagnosis not present

## 2022-03-22 DIAGNOSIS — I482 Chronic atrial fibrillation, unspecified: Secondary | ICD-10-CM | POA: Diagnosis not present

## 2022-03-22 DIAGNOSIS — I4891 Unspecified atrial fibrillation: Secondary | ICD-10-CM | POA: Diagnosis not present

## 2022-03-22 DIAGNOSIS — Z992 Dependence on renal dialysis: Secondary | ICD-10-CM | POA: Diagnosis not present

## 2022-03-22 DIAGNOSIS — E1122 Type 2 diabetes mellitus with diabetic chronic kidney disease: Secondary | ICD-10-CM | POA: Diagnosis not present

## 2022-03-24 DIAGNOSIS — Z992 Dependence on renal dialysis: Secondary | ICD-10-CM | POA: Diagnosis not present

## 2022-03-24 DIAGNOSIS — N186 End stage renal disease: Secondary | ICD-10-CM | POA: Diagnosis not present

## 2022-03-24 IMAGING — DX DG THORACIC SPINE 2V
3 series · 3 of 3 positions shown · non-contrast
Comparison: Lumbar spine radiographs-earlier same day; CT abdomen
pelvis-02/21/2021

Chest radiograph-03/20/2015

CLINICAL DATA: Fall 1 month ago with persistent back pain.

EXAM:
THORACIC SPINE 2 VIEWS

[t-spine ap]
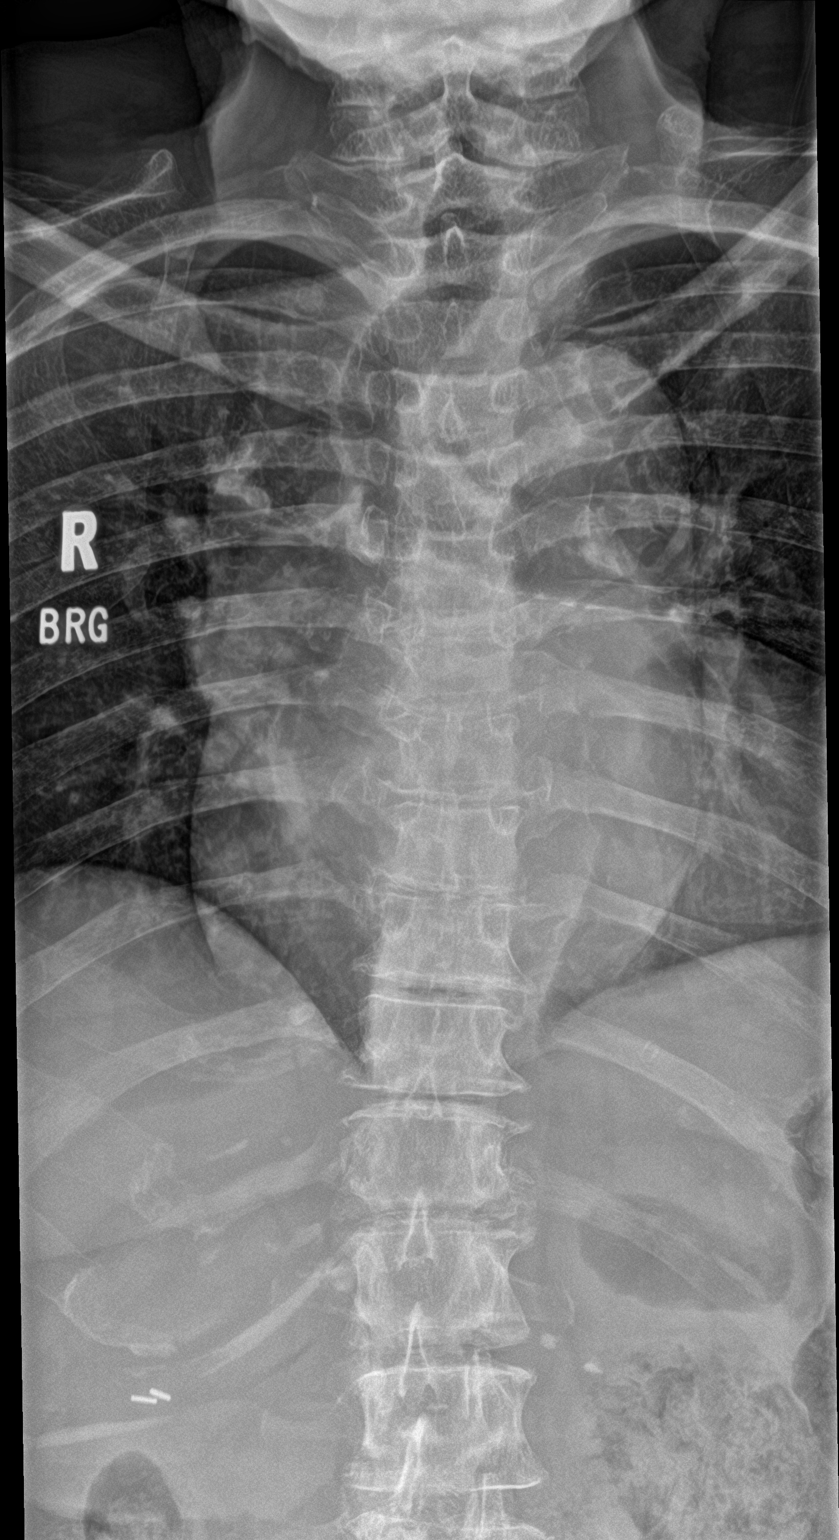

[t-spine lat]
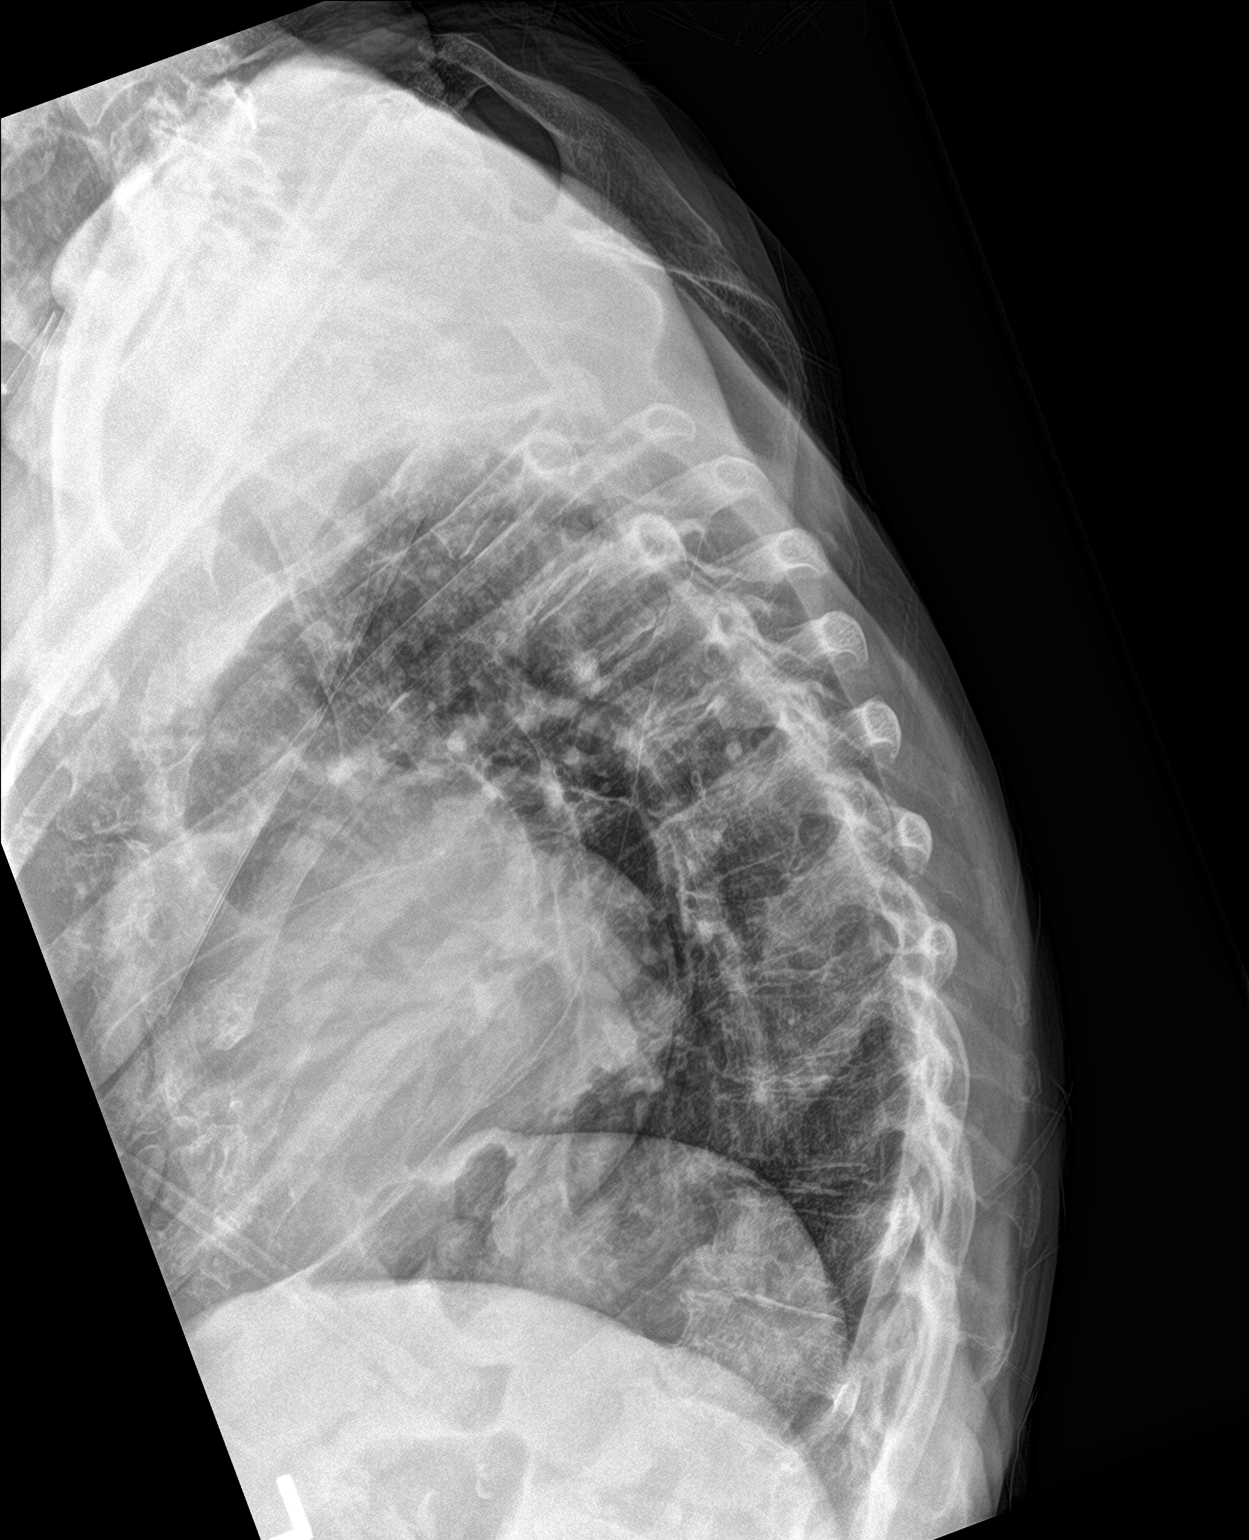

[t-spine swimmers]
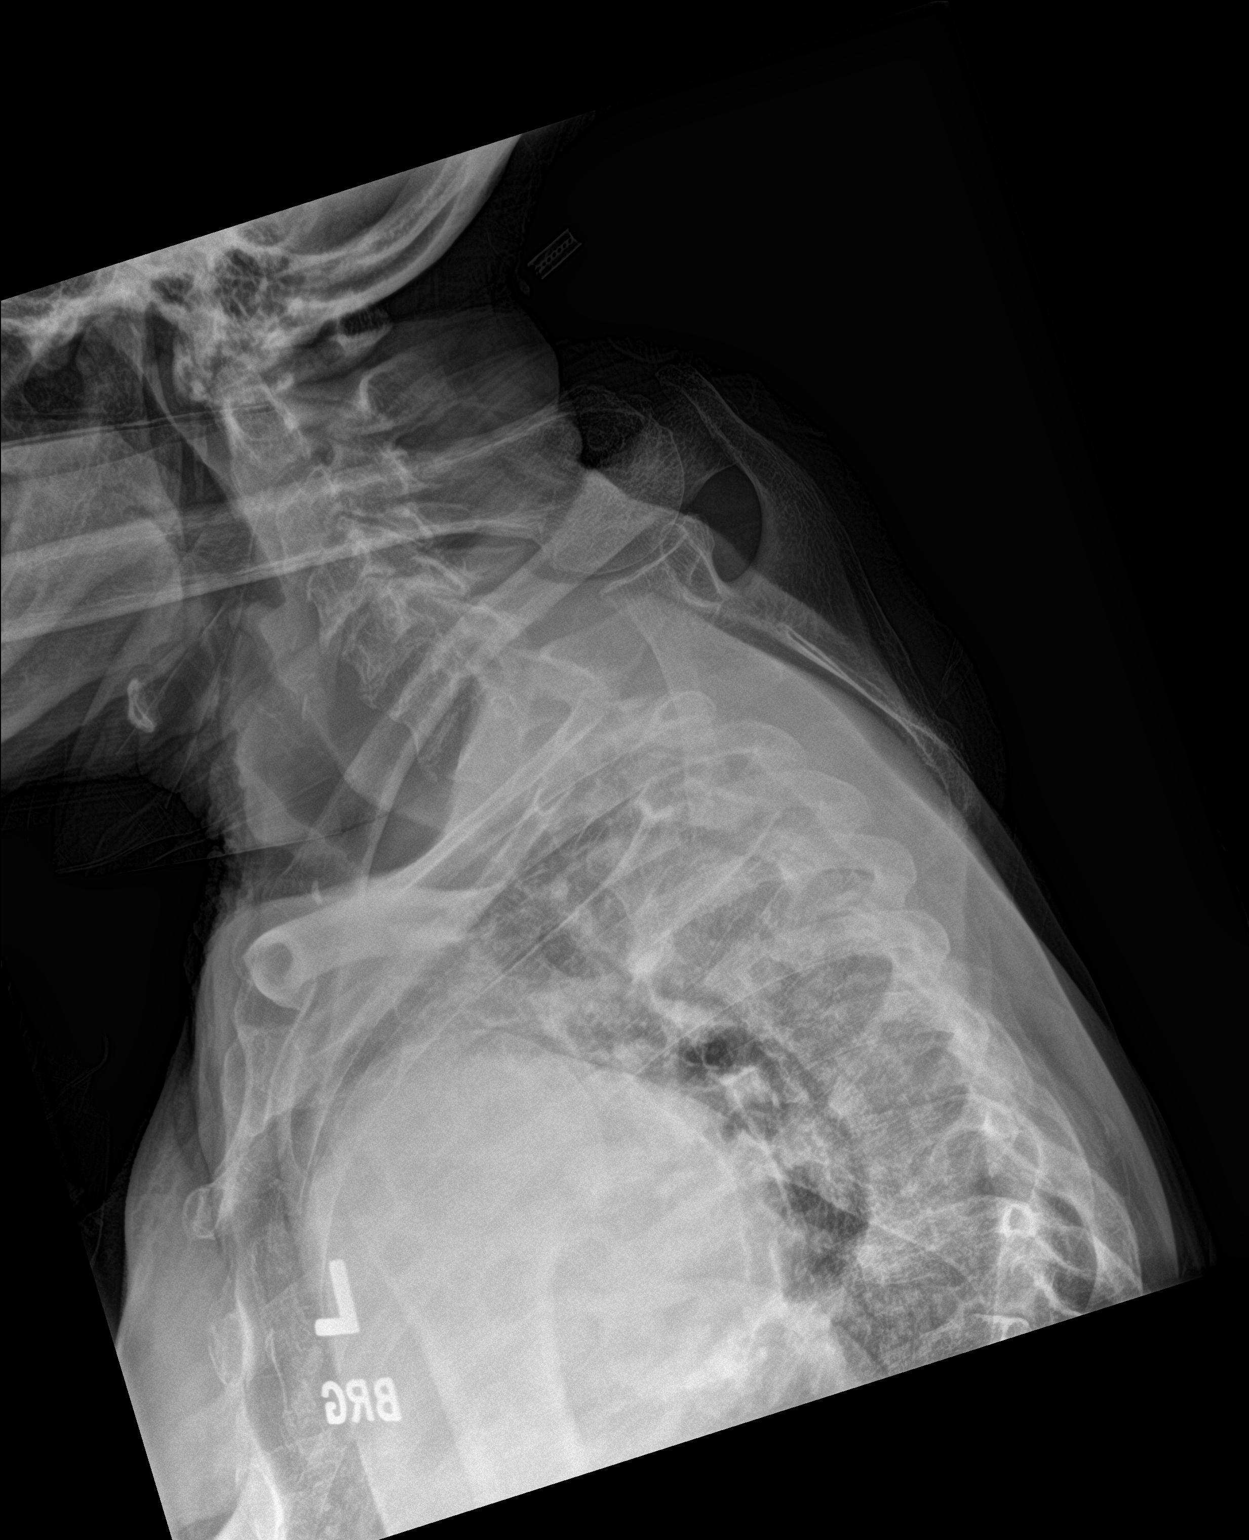

[3 of 3 positions shown; findings below may reference images not displayed]

FINDINGS: Evaluation of the superior aspect of the thoracic spine as well as
the cervicothoracic junction is degraded secondary to overlying
osseous and soft tissue structures.

Accentuated thoracic kyphosis. No definite anterolisthesis or
retrolisthesis. No significant scoliotic curvature of the thoracic
spine.

Thoracic vertebral body heights appear preserved. Stigmata of dish
within the midthoracic spine. Thoracic intervertebral disc space
heights appear preserved.

Limited visualization adjacent thorax demonstrates enlargement of
the cardiac silhouette. There is rightward deviation of the tracheal
air column at the level of the thoracic aorta, similar to remote
chest radiograph performed [DATE].
IMPRESSION: No definite acute findings.

## 2022-03-27 DIAGNOSIS — N186 End stage renal disease: Secondary | ICD-10-CM | POA: Diagnosis not present

## 2022-03-27 DIAGNOSIS — Z992 Dependence on renal dialysis: Secondary | ICD-10-CM | POA: Diagnosis not present

## 2022-03-29 ENCOUNTER — Encounter: Payer: Self-pay | Admitting: Vascular Surgery

## 2022-03-29 ENCOUNTER — Ambulatory Visit (INDEPENDENT_AMBULATORY_CARE_PROVIDER_SITE_OTHER): Payer: Medicare Other | Admitting: Vascular Surgery

## 2022-03-29 VITALS — BP 134/80 | HR 81 | Temp 98.2°F | Ht 69.0 in | Wt 225.0 lb

## 2022-03-29 DIAGNOSIS — N186 End stage renal disease: Secondary | ICD-10-CM | POA: Diagnosis not present

## 2022-03-29 DIAGNOSIS — Z992 Dependence on renal dialysis: Secondary | ICD-10-CM

## 2022-03-29 NOTE — Progress Notes (Signed)
Vascular and Vein Specialist of [place]  Patient name: Chelsea Meza MRN: [id number] DOB: 1935/05/23 Sex: female  REASON FOR CONSULT: Discuss access for hemodialysis  HPI: Chelsea Meza is a 86 y.o. female, who is here today for discussion of long-term access for hemodialysis.  She is here with her daughter.  She is a resident of a long-term care facility.  She had a long history of chronic renal insufficiency progressed to end-stage renal disease.  She had a tunneled hemodialysis catheter placed for acute dialysis on 01/03/2022.  She has dialysis at [facility].  She reports no difficulty with her catheter.  She is right-handed.  She is on Eliquis.  She does not have a pacemaker.  Past Medical History:  Diagnosis Date   Anemia    Chronic atrial fibrillation ([name])    CKD (chronic kidney disease) stage 3, GFR 30-59 ml/min ([name])    Diabetes mellitus without complication ([name])    Hyperlipidemia    Hypertension    Localized edema    Other specified disorders of thyroid    TIA (transient ischemic attack)    Unspecified convulsions ([name])     Family History  Problem Relation Age of Onset   Stroke Mother    Stroke Maternal Grandmother     SOCIAL HISTORY: Social History   Socioeconomic History   Marital status: Divorced    Spouse name: Not on file   Number of children: Not on file   Years of education: Not on file   Highest education level: Not on file  Occupational History   Not on file  Tobacco Use   Smoking status: Never   Smokeless tobacco: Never  Vaping Use   Vaping Use: Never used  Substance and Sexual Activity   Alcohol use: No   Drug use: No   Sexual activity: Not on file  Other Topics Concern   Not on file  Social History Narrative   Patient lives in [place] with her daughter.  1 level home.   [name] daughter works 2nd shift  At a [employer] memory car unit as a Merchant navy officer daughter is at [employer] after a stay at [employer] in [place] in March 2022   [place] Strain: Not on file  [place] Present (01/07/2022)   Hunger Vital Sign    Worried About [place] in the Last Year: Sometimes true    Ran Out of Food in the Last Year: Sometimes true  Transportation Needs: No Transportation Needs (01/07/2022)   PRAPARE - Hydrologist (Medical): No    Lack of Transportation (Non-Medical): No  Physical Activity: Not on file  Stress: Not on file  Social Connections: Not on file  Intimate Partner Violence: Not At Risk (01/07/2022)   Humiliation, Afraid, Rape, and Kick questionnaire    Fear of Current or Ex-Partner: No    Emotionally Abused: No    Physically Abused: No    Sexually Abused: No    No Known Allergies  Current Outpatient Medications  Medication Sig Dispense Refill   Accu-Chek FastClix Lancets MISC      ACCU-CHEK GUIDE test strip USE TO TEST TWICE DAILY.AL     acetaminophen (TYLENOL) 325 MG tablet Take 2 tablets (650 mg total) by mouth every 6 (six) hours as needed for mild pain, fever or headache (or Fever >/= 101). 120 tablet 0  apixaban (ELIQUIS) 2.5 MG TABS tablet Take 1 tablet (2.5 mg total) by mouth 2 (two) times daily. 180 tablet 4   atorvastatin (LIPITOR) 40 MG tablet Take 40 mg by mouth at bedtime.     Blood Glucose Monitoring Suppl (ACCU-CHEK GUIDE) w/Device KIT USE AS DIRECTED.I     carvedilol (COREG) 3.125 MG tablet Take 1 tablet (3.125 mg total) by mouth 2 (two) times daily with a meal. 60 tablet 1   Darbepoetin Alfa (ARANESP) 200 MCG/0.4ML SOSY injection Inject 0.4 mLs (200 mcg total) into the skin every 7 (seven) days. 1.68 mL 3   feeding supplement (ENSURE ENLIVE / ENSURE PLUS) LIQD Take 237 mLs by mouth 2 (two) times daily between meals. 237 mL 12   furosemide (LASIX) 40 MG tablet Take 1 tablet (40 mg total) by mouth daily. 30  tablet 2   GVOKE PFS 0.5 MG/0.1ML SOSY Inject 0.5 mg into the skin as needed (low bs).     hydrALAZINE (APRESOLINE) 100 MG tablet Take 1 tablet (100 mg total) by mouth 3 (three) times daily. (Patient taking differently: Take 100 mg by mouth 2 (two) times daily.) 90 tablet 1   insulin aspart (NOVOLOG) 100 UNIT/ML injection NovoLog insulin per sliding scale glucose 0-120--no units; 121 to 150-1 unit; 151 to 200-2 units; 201 to 250-3 units; 251 to 300-5 units; 30 to 350-7 units; 351 to 400-9 units 10 mL 11   Insulin Pen Needle (PEN NEEDLES) 32G X 4 MM MISC Use as directed 100 each 11   iron sucrose 100 mg in sodium chloride 0.9 % 100 mL Inject 100 mg into the vein every Monday, Wednesday, and Friday with hemodialysis. 3 ampule 2   lidocaine (LIDODERM) 5 % Place 1 patch onto the skin daily. Remove & Discard patch within 12 hours or as directed by MD 30 patch 0   sodium zirconium cyclosilicate (LOKELMA) 10 g PACK packet Take 10 g by mouth every Monday, Wednesday, and Friday.     amLODipine (NORVASC) 10 MG tablet Take 1 tablet (10 mg total) by mouth daily. (Patient not taking: Reported on 03/29/2022) 90 tablet 1   No current facility-administered medications for this visit.    REVIEW OF SYSTEMS:  _0  denotes positive finding, _1  denotes negative finding Cardiac  Comments:  Chest pain or chest pressure:    Shortness of breath upon exertion: x   Short of breath when lying flat:    Irregular heart rhythm:        Vascular    Pain in calf, thigh, or hip brought on by ambulation:    Pain in feet at night that wakes you up from your sleep:     Blood clot in your veins:    Leg swelling:  x       Pulmonary    Oxygen at home:    Productive cough:     Wheezing:         Neurologic    Sudden weakness in arms or legs:     Sudden numbness in arms or legs:     Sudden onset of difficulty speaking or slurred speech:    Temporary loss of vision in one eye:     Problems with dizziness:          Gastrointestinal    Blood in stool:     Vomited blood:         Genitourinary    Burning when urinating:     Blood in urine:  Psychiatric    Major depression:         Hematologic    Bleeding problems:    Problems with blood clotting too easily:        Skin    Rashes or ulcers:        Constitutional    Fever or chills:      PHYSICAL EXAM: Vitals:   03/29/22 0834  BP: 134/80  Pulse: 81  Temp: 98.2 F (36.8 C)  SpO2: 96%  Weight: 225 lb (102.1 kg)  Height: _0  (1.753 m)    GENERAL: The patient is a well-nourished female, in no acute distress. The vital signs are documented above. CARDIOVASCULAR: Plus radial pulses bilaterally.  Small surface veins bilaterally. PULMONARY: There is good air exchange  MUSCULOSKELETAL: There are no major deformities or cyanosis. NEUROLOGIC: No focal weakness or paresthesias are detected. SKIN: There are no ulcers or rashes noted. PSYCHIATRIC: The patient has a normal affect.  DATA:  I imaged her arm veins with SonoSite ultrasound.  She has very small cephalic and basilic veins bilaterally.  MEDICAL ISSUES: I discussed options for long-term hemodialysis with the patient and her daughter.  She does not appear to be a candidate for fistula creation.  I would recommend left upper arm AV graft as her initial dialysis access.  Patient's daughter works in a care facility and recently had an episode with one of the residents having severe bleed from their arm access.  She is quite concerned regarding this procedure for her mother.  I explained that this is a very uncommon complication from long-term access.  Did discuss the risk of catheter-based infection.  They wish to consider options further and are not willing to schedule at this time.  I explained that I would be happy to speak with her again in the office or if she decides to proceed, we can schedule a left upper arm AV Gore-Tex graft as an outpatient at [facility].   Rosetta Posner, MD [facility] Vascular and Vein Specialists of [facility] [phone] Pager [phone]  Note: Portions of this report may have been transcribed using voice recognition software.  Every effort has been made to ensure accuracy; however, inadvertent computerized transcription errors may still be present.

## 2022-03-31 DIAGNOSIS — N186 End stage renal disease: Secondary | ICD-10-CM | POA: Diagnosis not present

## 2022-03-31 DIAGNOSIS — Z992 Dependence on renal dialysis: Secondary | ICD-10-CM | POA: Diagnosis not present

## 2022-04-02 IMAGING — DX DG CHEST 1V PORT
1 series · 1 of 1 positions shown · non-contrast
Comparison: None.

CLINICAL DATA: Chest pain.  Diabetic

EXAM:
PORTABLE CHEST 1 VIEW

[chest ap]
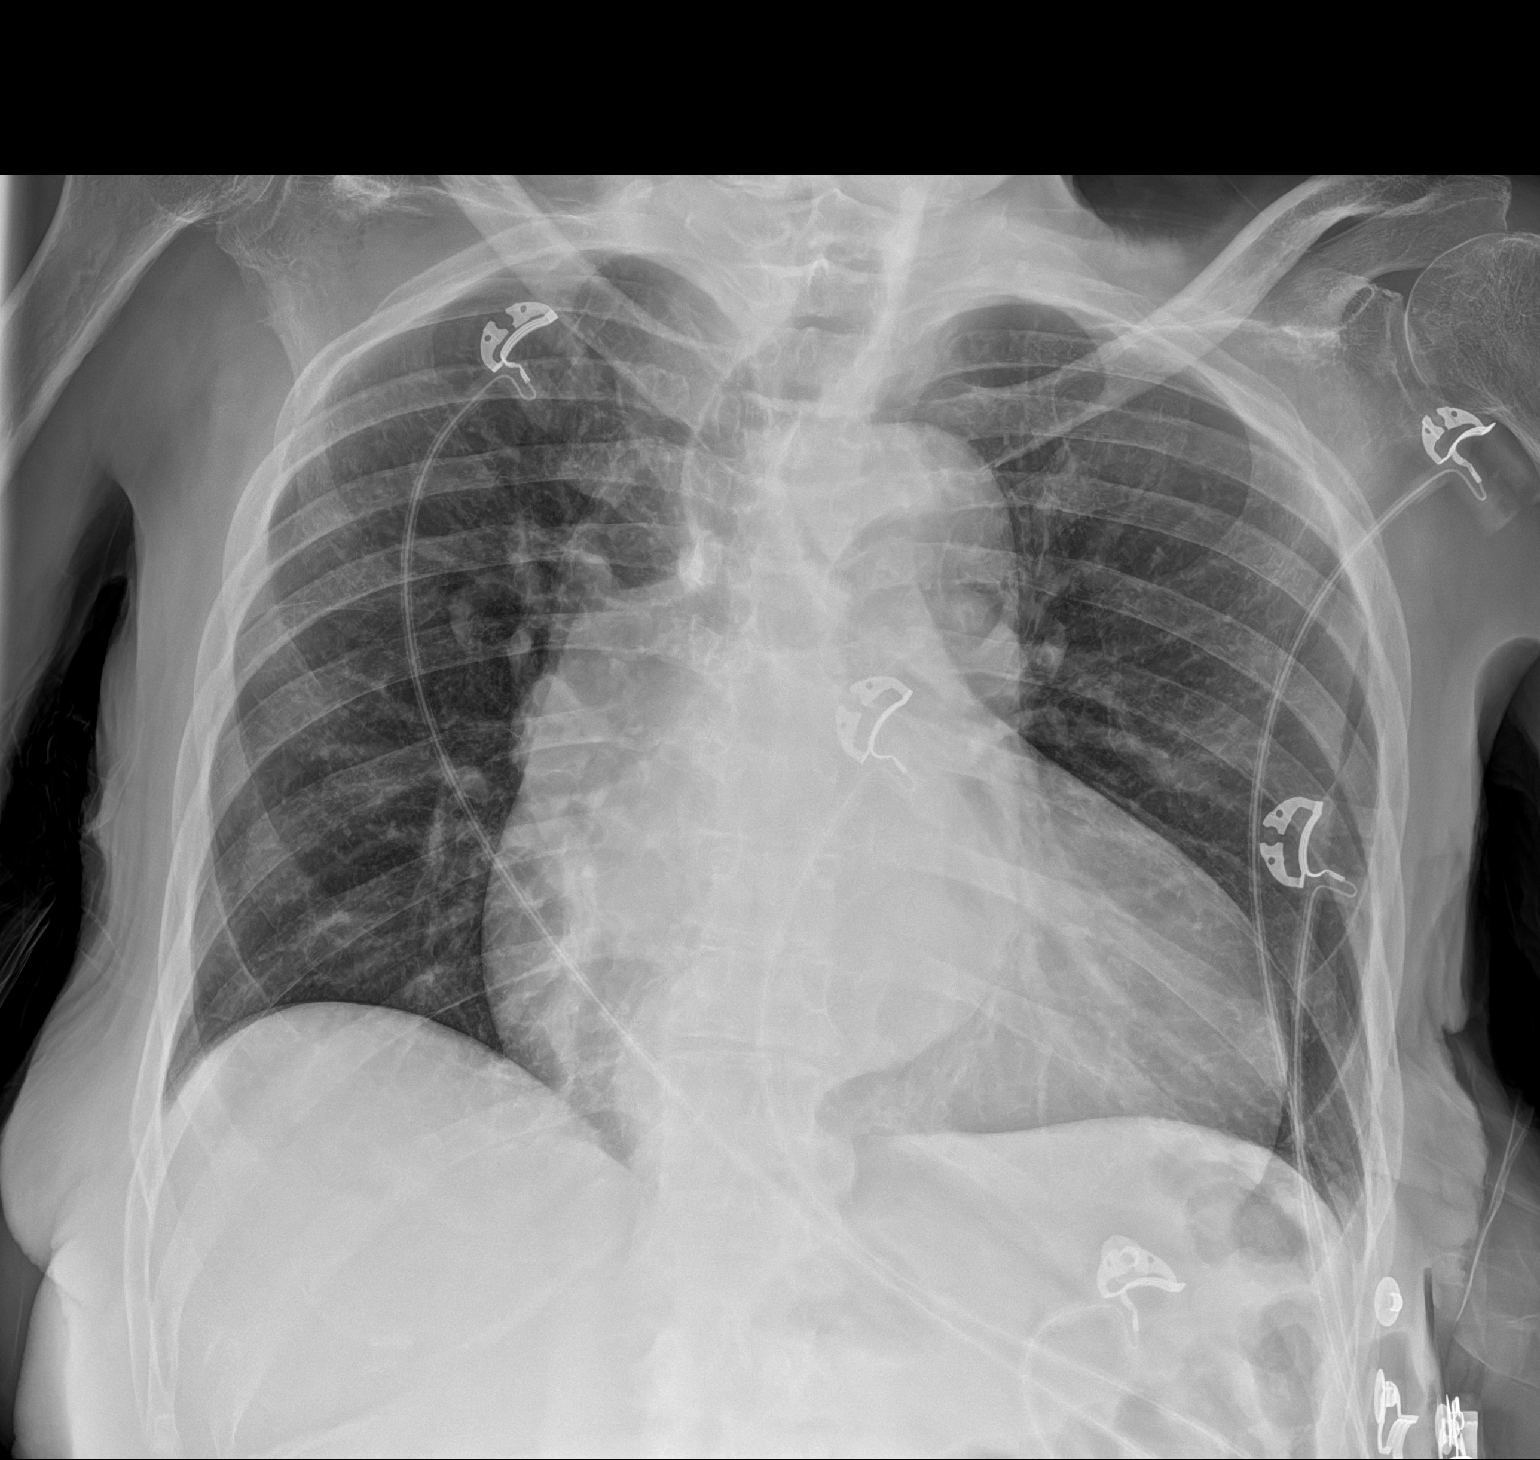

[1 of 1 positions shown; findings below may reference images not displayed]

FINDINGS: Stable enlarged cardiac silhouette. Ectatic aorta. No effusion,
infiltrate or pneumothorax. No acute osseous abnormality.
IMPRESSION: No acute cardiopulmonary findings.

## 2022-04-03 DIAGNOSIS — Z992 Dependence on renal dialysis: Secondary | ICD-10-CM | POA: Diagnosis not present

## 2022-04-03 DIAGNOSIS — N186 End stage renal disease: Secondary | ICD-10-CM | POA: Diagnosis not present

## 2022-04-05 DIAGNOSIS — Z992 Dependence on renal dialysis: Secondary | ICD-10-CM | POA: Diagnosis not present

## 2022-04-05 DIAGNOSIS — N186 End stage renal disease: Secondary | ICD-10-CM | POA: Diagnosis not present

## 2022-04-07 DIAGNOSIS — Z992 Dependence on renal dialysis: Secondary | ICD-10-CM | POA: Diagnosis not present

## 2022-04-07 DIAGNOSIS — N186 End stage renal disease: Secondary | ICD-10-CM | POA: Diagnosis not present

## 2022-04-10 DIAGNOSIS — N186 End stage renal disease: Secondary | ICD-10-CM | POA: Diagnosis not present

## 2022-04-10 DIAGNOSIS — Z992 Dependence on renal dialysis: Secondary | ICD-10-CM | POA: Diagnosis not present

## 2022-04-13 ENCOUNTER — Encounter (HOSPITAL_COMMUNITY): Payer: Self-pay | Admitting: *Deleted

## 2022-04-13 ENCOUNTER — Emergency Department (HOSPITAL_COMMUNITY)
Admission: EM | Admit: 2022-04-13 | Discharge: 2022-04-13 | Disposition: A | Discharge status: Skilled Nursing Facility | Payer: Medicare Other | Attending: Student | Admitting: Student

## 2022-04-13 ENCOUNTER — Other Ambulatory Visit: Payer: Self-pay

## 2022-04-13 DIAGNOSIS — E1122 Type 2 diabetes mellitus with diabetic chronic kidney disease: Secondary | ICD-10-CM | POA: Insufficient documentation

## 2022-04-13 DIAGNOSIS — I8290 Acute embolism and thrombosis of unspecified vein: Secondary | ICD-10-CM

## 2022-04-13 DIAGNOSIS — Z992 Dependence on renal dialysis: Secondary | ICD-10-CM | POA: Diagnosis not present

## 2022-04-13 DIAGNOSIS — I132 Hypertensive heart and chronic kidney disease with heart failure and with stage 5 chronic kidney disease, or end stage renal disease: Secondary | ICD-10-CM | POA: Insufficient documentation

## 2022-04-13 DIAGNOSIS — Z79899 Other long term (current) drug therapy: Secondary | ICD-10-CM | POA: Diagnosis not present

## 2022-04-13 DIAGNOSIS — Z7901 Long term (current) use of anticoagulants: Secondary | ICD-10-CM | POA: Diagnosis not present

## 2022-04-13 DIAGNOSIS — R6889 Other general symptoms and signs: Secondary | ICD-10-CM | POA: Diagnosis not present

## 2022-04-13 DIAGNOSIS — I5032 Chronic diastolic (congestive) heart failure: Secondary | ICD-10-CM | POA: Insufficient documentation

## 2022-04-13 DIAGNOSIS — Z794 Long term (current) use of insulin: Secondary | ICD-10-CM | POA: Diagnosis not present

## 2022-04-13 DIAGNOSIS — R52 Pain, unspecified: Secondary | ICD-10-CM | POA: Diagnosis not present

## 2022-04-13 DIAGNOSIS — N186 End stage renal disease: Secondary | ICD-10-CM | POA: Insufficient documentation

## 2022-04-13 DIAGNOSIS — I82C11 Acute embolism and thrombosis of right internal jugular vein: Secondary | ICD-10-CM | POA: Diagnosis not present

## 2022-04-13 DIAGNOSIS — I82409 Acute embolism and thrombosis of unspecified deep veins of unspecified lower extremity: Secondary | ICD-10-CM | POA: Diagnosis not present

## 2022-04-13 DIAGNOSIS — I82401 Acute embolism and thrombosis of unspecified deep veins of right lower extremity: Secondary | ICD-10-CM | POA: Diagnosis not present

## 2022-04-13 DIAGNOSIS — M7989 Other specified soft tissue disorders: Secondary | ICD-10-CM | POA: Diagnosis present

## 2022-04-13 DIAGNOSIS — Z743 Need for continuous supervision: Secondary | ICD-10-CM | POA: Diagnosis not present

## 2022-04-13 MED ORDER — APIXABAN 2.5 MG PO TABS: 2.5000 mg | ORAL_TABLET | Freq: Two times a day (BID) | ORAL | 0 refills | Status: AC

## 2022-04-13 MED ORDER — APIXABAN 2.5 MG PO TABS
2.5000 mg | ORAL_TABLET | Freq: Two times a day (BID) | ORAL | Status: DC
  Administered 2022-04-13: 2.5 mg via ORAL
  Filled 2022-04-13: qty 1

## 2022-04-13 NOTE — ED Provider Notes (Signed)
[facility] EMERGENCY DEPARTMENT Provider Note  CSN: [id number] Arrival date & time: 04/13/22 1136  Chief Complaint(s) DVT  HPI Chelsea Meza is a 86 y.o. female with PMH ESRD on hemodialysis Monday Wednesday Friday, HTN, HLD, CHF, A-fib who presents emergency department for evaluation of a DVT.  Patient states that she was having right upper extremity swelling and tenderness over the last week and they did not perform her dialysis yesterday due to concern for an upper extremity DVT.  She had an ultrasound performed that shows an internal jugular clot but no blood clot in the arm.  She was then sent to the emergency department for further evaluation.  Per chart review, patient was scheduled to have finished her Eliquis last week and I am unsure if she is still taking this medication.  She currently states that the swelling is improved and she denies chest pain, shortness of breath, nausea, vomiting or other systemic symptoms.   Past Medical History Past Medical History:  Diagnosis Date   Anemia    Chronic atrial fibrillation ([name])    CKD (chronic kidney disease) stage 3, GFR 30-59 ml/min ([name])    Diabetes mellitus without complication ([place])    Hyperlipidemia    Hypertension    Localized edema    Other specified disorders of thyroid    TIA (transient ischemic attack)    Unspecified convulsions ([place])    Patient Active Problem List   Diagnosis Date Noted   Hyperkalemia 12/24/2021   Acute renal failure superimposed on stage 4 chronic kidney disease, unspecified acute renal failure type ([place]) 12/21/2021   Chronic diastolic CHF (congestive heart failure) ([place]) 12/21/2021   Hypothermia 12/21/2021   Thyroid dysfunction 12/17/2021   Debility 12/16/2021    Class: Acute   A-fib ([place]) 12/16/2021    Class: Chronic   Lower extremity edema 12/16/2021   Pressure injury of skin 02/24/2021   Seizures ([place]) 38/18/2993   Acute metabolic encephalopathy 71/69/6789   Acute lower UTI 02/23/2021    Malnutrition of moderate degree 06/23/2020   Poorly controlled diabetes mellitus ([place]) 06/21/2020   Hypoglycemia associated with type 2 diabetes mellitus ([place]) 06/21/2020   Hypertensive urgency 04/05/2020   Cardiac arrest ([place]) 04/05/2020   Acute congestive heart failure ([place]) 10/18/2018   Hyperosmolar non-ketotic state in patient with type 2 diabetes mellitus ([place]) 10/18/2018   Transient cerebral ischemia 11/05/2017   Hyperglycemia due to diabetes mellitus ([place]) 03/20/2015   Hyperlipidemia 03/20/2015   Diabetic hyperosmolar non-ketotic state ([place]) 07/14/2012   DKA, type 2 ([place]) 07/13/2012   Acute kidney injury superimposed on CKD ([place]) 07/13/2012   Paroxysmal supraventricular tachycardia ([place]) 07/13/2012   Paraparesis ([place]) 07/13/2012   Paresthesia of left leg 07/13/2012   Essential hypertension 07/13/2012   Paresthesia of skin 07/13/2012   Home Medication(s) Prior to Admission medications   Medication Sig Start Date End Date Taking? Authorizing Provider  Accu-Chek FastClix Lancets MISC  04/09/20   [provider]  ACCU-CHEK GUIDE test strip USE TO TEST TWICE DAILY.AL 07/05/20   [provider]  acetaminophen (TYLENOL) 325 MG tablet Take 2 tablets (650 mg total) by mouth every 6 (six) hours as needed for mild pain, fever or headache (or Fever >/= 101). 01/08/22   Roxan Hockey, MD  amLODipine (NORVASC) 10 MG tablet Take 1 tablet (10 mg total) by mouth daily. Patient not taking: Reported on 03/29/2022 01/08/22 03/14/22  Roxan Hockey, MD  apixaban (ELIQUIS) 2.5 MG TABS tablet Take 1 tablet (2.5 mg total) by mouth  2 (two) times daily. 04/13/22 07/12/22  Emanuel Dowson, MD  atorvastatin (LIPITOR) 40 MG tablet Take 40 mg by mouth at bedtime. 10/19/21   [provider]  Blood Glucose Monitoring Suppl (ACCU-CHEK GUIDE) w/Device KIT USE AS DIRECTED.I 09/03/20   [provider]  carvedilol (COREG) 3.125 MG tablet Take 1 tablet (3.125 mg total) by mouth 2  (two) times daily with a meal. 01/08/22 03/29/22  Roxan Hockey, MD  Darbepoetin Alfa (ARANESP) 200 MCG/0.4ML SOSY injection Inject 0.4 mLs (200 mcg total) into the skin every 7 (seven) days. 01/08/22   Roxan Hockey, MD  feeding supplement (ENSURE ENLIVE / ENSURE PLUS) LIQD Take 237 mLs by mouth 2 (two) times daily between meals. 10/28/21   Manuella Ghazi, Pratik D, DO  furosemide (LASIX) 40 MG tablet Take 1 tablet (40 mg total) by mouth daily. 01/08/22   Emokpae, Courage, MD  GVOKE PFS 0.5 MG/0.1ML SOSY Inject 0.5 mg into the skin as needed (low bs). 02/08/21   [provider]  hydrALAZINE (APRESOLINE) 100 MG tablet Take 1 tablet (100 mg total) by mouth 3 (three) times daily. Patient taking differently: Take 100 mg by mouth 2 (two) times daily. 10/22/18   Orson Eva, MD  insulin aspart (NOVOLOG) 100 UNIT/ML injection NovoLog insulin per sliding scale glucose 0-120--no units; 121 to 150-1 unit; 151 to 200-2 units; 201 to 250-3 units; 251 to 300-5 units; 30 to 350-7 units; 351 to 400-9 units 01/08/22   Roxan Hockey, MD  Insulin Pen Needle (PEN NEEDLES) 32G X 4 MM MISC Use as directed 06/23/20   Jonetta Osgood, MD  iron sucrose 100 mg in sodium chloride 0.9 % 100 mL Inject 100 mg into the vein every Monday, Wednesday, and Friday with hemodialysis. 01/09/22   Roxan Hockey, MD  lidocaine (LIDODERM) 5 % Place 1 patch onto the skin daily. Remove & Discard patch within 12 hours or as directed by MD 04/16/21   Evalee Jefferson, PA-C  sodium zirconium cyclosilicate (LOKELMA) 10 g PACK packet Take 10 g by mouth every Monday, Wednesday, and Friday. 12/26/21   Orson Eva, MD                                                                                                                                    Past Surgical History Past Surgical History:  Procedure Laterality Date   CHOLECYSTECTOMY     INSERTION OF DIALYSIS CATHETER Right 01/03/2022   Procedure: INSERTION OF DIALYSIS CATHETER;  Surgeon: Virl Cagey, MD;  Location: [facility];  Service: General;  Laterality: Right;   Family History Family History  Problem Relation Age of Onset   Stroke Mother    Stroke Maternal Grandmother     Social History Social History   Tobacco Use   Smoking status: Never   Smokeless tobacco: Never  Vaping Use   Vaping Use: Never used  Substance Use Topics   Alcohol use: No  Drug use: No   Allergies Patient has no known allergies.  Review of Systems Review of Systems  Musculoskeletal:  Positive for myalgias.    Physical Exam Vital Signs  I have reviewed the triage vital signs BP (!) 140/83   Pulse 66   Temp 98 F (36.7 C) (Oral)   Resp 16   Ht _0  (1.753 m)   Wt 102 kg   SpO2 99%   BMI 33.21 kg/m   Physical Exam Vitals and nursing note reviewed.  Constitutional:      General: She is not in acute distress.    Appearance: She is well-developed.  HENT:     Head: Normocephalic and atraumatic.  Eyes:     Conjunctiva/sclera: Conjunctivae normal.  Cardiovascular:     Rate and Rhythm: Normal rate and regular rhythm.     Heart sounds: No murmur heard. Pulmonary:     Effort: Pulmonary effort is normal. No respiratory distress.     Breath sounds: Normal breath sounds.  Abdominal:     Palpations: Abdomen is soft.     Tenderness: There is no abdominal tenderness.  Musculoskeletal:        General: Swelling present.     Cervical back: Neck supple.  Skin:    General: Skin is warm and dry.     Capillary Refill: Capillary refill takes less than 2 seconds.  Neurological:     Mental Status: She is alert.  Psychiatric:        Mood and Affect: Mood normal.     ED Results and Treatments Labs (all labs ordered are listed, but only abnormal results are displayed) Labs Reviewed - No data to display                                                                                                                        Radiology No results found.  Pertinent labs & imaging results  that were available during my care of the patient were reviewed by me and considered in my medical decision making (see MDM for details).  Medications Ordered in ED Medications  apixaban (ELIQUIS) tablet 2.5 mg (2.5 mg Oral Given 04/13/22 1316)                                                                                                                                     Procedures Procedures  (including critical care time)  Medical Decision Making / ED Course   This patient presents to the ED for concern of DVT, this involves an extensive number of treatment options, and is a complaint that carries with it a high risk of complications and morbidity.  The differential diagnosis includes DVT from tunneled dialysis catheter placement, traumatic DVT, hypercoagulability  MDM: Patient seen emerged department for evaluation of a DVT.  Physical exam with some mild swelling over the right upper extremity but is otherwise unremarkable.  I spoke with the vascular surgeon on-call who states that current guidelines favor anticoagulation in the setting of these line associated DVTs but patient is still safe for dialysis as long as she is anticoagulated.  I restarted her Eliquis and patient will likely require this long-term.  Patient then discharged with outpatient follow-up for dialysis likely tomorrow.  Patient has no chest pain or shortness of breath and I have low suspicion for PE.   Additional history obtained: -Additional history obtained from daughter -External records from outside source obtained and reviewed including: Chart review including previous notes, labs, imaging, consultation notes    Medicines ordered and prescription drug management: Meds ordered this encounter  Medications   apixaban (ELIQUIS) tablet 2.5 mg   apixaban (ELIQUIS) 2.5 MG TABS tablet    Sig: Take 1 tablet (2.5 mg total) by mouth 2 (two) times daily.    Dispense:  180 tablet    Refill:  0    -I have reviewed  the patients home medicines and have made adjustments as needed  Critical interventions none  Consultations Obtained: I requested consultation with the vascular surgeon on-call,  and discussed lab and imaging findings as well as pertinent plan - they recommend: Anticoagulation   Cardiac Monitoring: The patient was maintained on a cardiac monitor.  I personally viewed and interpreted the cardiac monitored which showed an underlying rhythm of: NSR  Social Determinants of Health:  Factors impacting patients care include: Currently lives in a skilled nursing facility   Reevaluation: After the interventions noted above, I reevaluated the patient and found that they have :stayed the same  Co morbidities that complicate the patient evaluation  Past Medical History:  Diagnosis Date   Anemia    Chronic atrial fibrillation ([name])    CKD (chronic kidney disease) stage 3, GFR 30-59 ml/min ([name])    Diabetes mellitus without complication ([name])    Hyperlipidemia    Hypertension    Localized edema    Other specified disorders of thyroid    TIA (transient ischemic attack)    Unspecified convulsions ([place])       Dispostion: I considered admission for this patient, but she does not meet inpatient material for admission she is safe for discharge with  outpatient follow-up     Final Clinical Impression(s) / ED Diagnoses Final diagnoses:  Acute deep vein thrombosis (DVT) of non-extremity vein     _0 @    Teressa Lower, MD 04/13/22 1816

## 2022-04-13 NOTE — Discharge Instructions (Addendum)
OK to use IJ catheter

## 2022-04-13 NOTE — ED Triage Notes (Signed)
Pt brought in by [facility] from [facility] for c/o possible blood clot to right arm; facility performed a doppler and blood clot was found in right arm; pt has no arm complaints, only feet complaints  Cbg 197 180/80

## 2022-04-14 DIAGNOSIS — N186 End stage renal disease: Secondary | ICD-10-CM | POA: Diagnosis not present

## 2022-04-14 DIAGNOSIS — Z992 Dependence on renal dialysis: Secondary | ICD-10-CM | POA: Diagnosis not present

## 2022-04-14 DIAGNOSIS — I4891 Unspecified atrial fibrillation: Secondary | ICD-10-CM | POA: Diagnosis not present

## 2022-04-14 DIAGNOSIS — E1122 Type 2 diabetes mellitus with diabetic chronic kidney disease: Secondary | ICD-10-CM | POA: Diagnosis not present

## 2022-04-14 DIAGNOSIS — I509 Heart failure, unspecified: Secondary | ICD-10-CM | POA: Diagnosis not present

## 2022-04-14 DIAGNOSIS — I10 Essential (primary) hypertension: Secondary | ICD-10-CM | POA: Diagnosis not present

## 2022-04-14 DIAGNOSIS — M6281 Muscle weakness (generalized): Secondary | ICD-10-CM | POA: Diagnosis not present

## 2022-04-15 ENCOUNTER — Other Ambulatory Visit: Payer: Self-pay

## 2022-04-15 ENCOUNTER — Emergency Department (HOSPITAL_COMMUNITY): Payer: Medicare Other

## 2022-04-15 ENCOUNTER — Encounter (HOSPITAL_COMMUNITY): Payer: Self-pay

## 2022-04-15 ENCOUNTER — Emergency Department (HOSPITAL_COMMUNITY)
Admission: EM | Admit: 2022-04-15 | Discharge: 2022-04-21 | Disposition: A | Discharge status: Home / Self Care | Payer: Medicare Other | Attending: Emergency Medicine | Admitting: Emergency Medicine

## 2022-04-15 DIAGNOSIS — N250 Renal osteodystrophy: Secondary | ICD-10-CM | POA: Diagnosis not present

## 2022-04-15 DIAGNOSIS — Z992 Dependence on renal dialysis: Secondary | ICD-10-CM | POA: Insufficient documentation

## 2022-04-15 DIAGNOSIS — R1084 Generalized abdominal pain: Secondary | ICD-10-CM | POA: Diagnosis not present

## 2022-04-15 DIAGNOSIS — R2681 Unsteadiness on feet: Secondary | ICD-10-CM | POA: Diagnosis not present

## 2022-04-15 DIAGNOSIS — R739 Hyperglycemia, unspecified: Secondary | ICD-10-CM | POA: Diagnosis not present

## 2022-04-15 DIAGNOSIS — N186 End stage renal disease: Secondary | ICD-10-CM | POA: Diagnosis not present

## 2022-04-15 DIAGNOSIS — R531 Weakness: Secondary | ICD-10-CM | POA: Insufficient documentation

## 2022-04-15 DIAGNOSIS — N289 Disorder of kidney and ureter, unspecified: Secondary | ICD-10-CM | POA: Diagnosis not present

## 2022-04-15 DIAGNOSIS — R6889 Other general symptoms and signs: Secondary | ICD-10-CM | POA: Diagnosis not present

## 2022-04-15 DIAGNOSIS — R109 Unspecified abdominal pain: Secondary | ICD-10-CM | POA: Diagnosis not present

## 2022-04-15 DIAGNOSIS — D631 Anemia in chronic kidney disease: Secondary | ICD-10-CM | POA: Diagnosis not present

## 2022-04-15 DIAGNOSIS — Z794 Long term (current) use of insulin: Secondary | ICD-10-CM | POA: Diagnosis not present

## 2022-04-15 DIAGNOSIS — Z743 Need for continuous supervision: Secondary | ICD-10-CM | POA: Diagnosis not present

## 2022-04-15 DIAGNOSIS — K5900 Constipation, unspecified: Secondary | ICD-10-CM

## 2022-04-15 DIAGNOSIS — I120 Hypertensive chronic kidney disease with stage 5 chronic kidney disease or end stage renal disease: Secondary | ICD-10-CM | POA: Diagnosis not present

## 2022-04-15 DIAGNOSIS — N281 Cyst of kidney, acquired: Secondary | ICD-10-CM | POA: Diagnosis not present

## 2022-04-15 DIAGNOSIS — Z7901 Long term (current) use of anticoagulants: Secondary | ICD-10-CM | POA: Diagnosis not present

## 2022-04-15 LAB — CBC WITH DIFFERENTIAL/PLATELET
Abs Immature Granulocytes: 0.04 10*3/uL (ref 0.00–0.07)
Basophils Absolute: 0.1 10*3/uL (ref 0.0–0.1)
Basophils Relative: 1 %
Eosinophils Absolute: 0.1 10*3/uL (ref 0.0–0.5)
Eosinophils Relative: 2 %
HCT: 41.9 % (ref 36.0–46.0)
Hemoglobin: 13.3 g/dL (ref 12.0–15.0)
Immature Granulocytes: 1 %
Lymphocytes Relative: 20 %
Lymphs Abs: 1.5 10*3/uL (ref 0.7–4.0)
MCH: 27.1 pg (ref 26.0–34.0)
MCHC: 31.7 g/dL (ref 30.0–36.0)
MCV: 85.3 fL (ref 80.0–100.0)
Monocytes Absolute: 0.5 10*3/uL (ref 0.1–1.0)
Monocytes Relative: 6 %
Neutro Abs: 5.5 10*3/uL (ref 1.7–7.7)
Neutrophils Relative %: 70 %
Platelets: 227 10*3/uL (ref 150–400)
RBC: 4.91 MIL/uL (ref 3.87–5.11)
RDW: 15.9 % — ABNORMAL HIGH (ref 11.5–15.5)
WBC: 7.7 10*3/uL (ref 4.0–10.5)
nRBC: 0 % (ref 0.0–0.2)

## 2022-04-15 LAB — URINALYSIS, ROUTINE W REFLEX MICROSCOPIC
Bilirubin Urine: NEGATIVE
Glucose, UA: >=500 mg/dL — AB
Hgb urine dipstick: NEGATIVE
Ketones, ur: NEGATIVE mg/dL
Leukocytes,Ua: NEGATIVE
Nitrite: NEGATIVE
Protein, ur: >=300 mg/dL — AB
Specific Gravity, Urine: 1.006 (ref 1.005–1.030)
pH: 7 (ref 5.0–8.0)

## 2022-04-15 LAB — COMPREHENSIVE METABOLIC PANEL
ALT: 20 U/L (ref 0–44)
AST: 21 U/L (ref 15–41)
Albumin: 2.9 g/dL — ABNORMAL LOW (ref 3.5–5.0)
Alkaline Phosphatase: 79 U/L (ref 38–126)
Anion gap: 8 (ref 5–15)
BUN: 34 mg/dL — ABNORMAL HIGH (ref 8–23)
CO2: 29 mmol/L (ref 22–32)
Calcium: 8.7 mg/dL — ABNORMAL LOW (ref 8.9–10.3)
Chloride: 99 mmol/L (ref 98–111)
Creatinine, Ser: 2.21 mg/dL — ABNORMAL HIGH (ref 0.44–1.00)
GFR, Estimated: 21 mL/min — ABNORMAL LOW (ref >60)
Glucose, Bld: 293 mg/dL — ABNORMAL HIGH (ref 70–99)
Potassium: 3.9 mmol/L (ref 3.5–5.1)
Sodium: 136 mmol/L (ref 135–145)
Total Bilirubin: 0.4 mg/dL (ref 0.3–1.2)
Total Protein: 6.5 g/dL (ref 6.5–8.1)

## 2022-04-15 LAB — CBG MONITORING, ED: Glucose-Capillary: 228 mg/dL — ABNORMAL HIGH (ref 70–99)

## 2022-04-15 LAB — LIPASE, BLOOD: Lipase: 37 U/L (ref 11–51)

## 2022-04-15 MED ORDER — SENNOSIDES-DOCUSATE SODIUM 8.6-50 MG PO TABS: 1.0000 | ORAL_TABLET | Freq: Every day | ORAL | 0 refills | Status: AC

## 2022-04-15 MED ORDER — ACETAMINOPHEN 325 MG PO TABS
650.0000 mg | ORAL_TABLET | Freq: Four times a day (QID) | ORAL | Status: DC | PRN
  Administered 2022-04-15 – 2022-04-21 (×3): 650 mg via ORAL
  Filled 2022-04-15 (×3): qty 2

## 2022-04-15 MED ORDER — INSULIN ASPART 100 UNIT/ML IJ SOLN
1.0000 [IU] | Freq: Three times a day (TID) | INTRAMUSCULAR | Status: DC
  Administered 2022-04-15: 3 [IU] via SUBCUTANEOUS
  Administered 2022-04-15: 5 [IU] via SUBCUTANEOUS
  Administered 2022-04-16: 3 [IU] via SUBCUTANEOUS
  Administered 2022-04-16: 5 [IU] via SUBCUTANEOUS
  Administered 2022-04-16: 2 [IU] via SUBCUTANEOUS
  Administered 2022-04-17 (×2): 3 [IU] via SUBCUTANEOUS
  Administered 2022-04-17 – 2022-04-20 (×5): 2 [IU] via SUBCUTANEOUS
  Administered 2022-04-20: 1 [IU] via SUBCUTANEOUS
  Administered 2022-04-21: 2 [IU] via SUBCUTANEOUS
  Filled 2022-04-15 (×13): qty 1

## 2022-04-15 MED ORDER — SODIUM CHLORIDE 0.9 % IV SOLN: INTRAVENOUS | Status: DC

## 2022-04-15 MED ORDER — APIXABAN 2.5 MG PO TABS
2.5000 mg | ORAL_TABLET | Freq: Two times a day (BID) | ORAL | Status: DC
  Administered 2022-04-15 – 2022-04-17 (×5): 2.5 mg via ORAL
  Filled 2022-04-15 (×5): qty 1

## 2022-04-15 MED ORDER — HYDRALAZINE HCL 25 MG PO TABS
100.0000 mg | ORAL_TABLET | Freq: Two times a day (BID) | ORAL | Status: DC
  Administered 2022-04-15 – 2022-04-21 (×12): 100 mg via ORAL
  Filled 2022-04-15 (×12): qty 4

## 2022-04-15 MED ORDER — CARVEDILOL 3.125 MG PO TABS
3.1250 mg | ORAL_TABLET | Freq: Two times a day (BID) | ORAL | Status: DC
  Administered 2022-04-15 – 2022-04-21 (×10): 3.125 mg via ORAL
  Filled 2022-04-15 (×10): qty 1

## 2022-04-15 MED ORDER — FENTANYL CITRATE PF 50 MCG/ML IJ SOSY
12.5000 ug | PREFILLED_SYRINGE | Freq: Once | INTRAMUSCULAR | Status: AC
  Administered 2022-04-15: 12.5 ug via INTRAVENOUS
  Filled 2022-04-15: qty 1

## 2022-04-15 MED ORDER — ONDANSETRON HCL 4 MG/2ML IJ SOLN
4.0000 mg | Freq: Once | INTRAMUSCULAR | Status: AC
  Administered 2022-04-15: 4 mg via INTRAVENOUS
  Filled 2022-04-15: qty 2

## 2022-04-15 MED ORDER — FUROSEMIDE 40 MG PO TABS
40.0000 mg | ORAL_TABLET | Freq: Every day | ORAL | Status: DC
  Administered 2022-04-15 – 2022-04-21 (×7): 40 mg via ORAL
  Filled 2022-04-15 (×7): qty 1

## 2022-04-15 MED ORDER — AMLODIPINE BESYLATE 5 MG PO TABS
10.0000 mg | ORAL_TABLET | Freq: Every day | ORAL | Status: DC
  Administered 2022-04-15 – 2022-04-21 (×6): 10 mg via ORAL
  Filled 2022-04-15 (×6): qty 2

## 2022-04-15 MED ORDER — LABETALOL HCL 5 MG/ML IV SOLN
10.0000 mg | INTRAVENOUS | Status: DC | PRN
  Administered 2022-04-16: 10 mg via INTRAVENOUS
  Filled 2022-04-15 (×2): qty 4

## 2022-04-15 MED ORDER — ATORVASTATIN CALCIUM 40 MG PO TABS
40.0000 mg | ORAL_TABLET | Freq: Every day | ORAL | Status: DC
  Administered 2022-04-15 – 2022-04-20 (×6): 40 mg via ORAL
  Filled 2022-04-15 (×6): qty 1

## 2022-04-15 MED ORDER — IOHEXOL 9 MG/ML PO SOLN
ORAL | Status: AC
  Administered 2022-04-15: 500 mL via ORAL
  Filled 2022-04-15: qty 1000

## 2022-04-15 MED ORDER — IOHEXOL 9 MG/ML PO SOLN
500.0000 mL | ORAL | Status: AC
  Administered 2022-04-15: 500 mL via ORAL

## 2022-04-15 NOTE — ED Notes (Signed)
Three person assist utilized to put patient in wheel chair and to toilet for BM. Patient can bear weight, but unable to stand on her on and unable to walk.  Patient had very large bowel movement and stated her abdomen felt back to normal, "much better".  Spoke to daughter about patient being discharged. Daughter stated "she just came from rehab yesterday and they used a lift stretcher to get her up the steps and I have been unable to get her up every since she got home until EMS picked her up this morning. I have no way to care for her if she can't get up".  Dr. Vanita Panda informed of possible need to discuss patient's discharge with social worker.

## 2022-04-15 NOTE — ED Provider Notes (Signed)
[facility] EMERGENCY DEPARTMENT Provider Note   CSN: [id number] Arrival date & time: 04/15/22  0459     History  Chief Complaint  Patient presents with   Abdominal Pain    Chelsea Meza is a 86 y.o. female.  HPI Patient with multiple medical issues including end-stage renal disease, baseline difficulty with bowel movements, recently diagnosed DVT, now on Eliquis presents with abdominal pain.  Last dialysis was yesterday.  She notes that for about a week she has had abdominal pain, worse over the past day, possibly.  Pain is diffuse, no vomiting, no fever, no dyspnea.  She transition from a nursing facility to her daughter's house yesterday.    Home Medications Prior to Admission medications   Medication Sig Start Date End Date Taking? Authorizing Provider  Accu-Chek FastClix Lancets MISC  04/09/20   [provider]  ACCU-CHEK GUIDE test strip USE TO TEST TWICE DAILY.AL 07/05/20   [provider]  acetaminophen (TYLENOL) 325 MG tablet Take 2 tablets (650 mg total) by mouth every 6 (six) hours as needed for mild pain, fever or headache (or Fever >/= 101). 01/08/22   Roxan Hockey, MD  amLODipine (NORVASC) 10 MG tablet Take 1 tablet (10 mg total) by mouth daily. Patient not taking: Reported on 03/29/2022 01/08/22 03/14/22  Roxan Hockey, MD  apixaban (ELIQUIS) 2.5 MG TABS tablet Take 1 tablet (2.5 mg total) by mouth 2 (two) times daily. 04/13/22 07/12/22  Kommor, Madison, MD  atorvastatin (LIPITOR) 40 MG tablet Take 40 mg by mouth at bedtime. 10/19/21   [provider]  Blood Glucose Monitoring Suppl (ACCU-CHEK GUIDE) w/Device KIT USE AS DIRECTED.I 09/03/20   [provider]  carvedilol (COREG) 3.125 MG tablet Take 1 tablet (3.125 mg total) by mouth 2 (two) times daily with a meal. 01/08/22 03/29/22  Roxan Hockey, MD  Darbepoetin Alfa (ARANESP) 200 MCG/0.4ML SOSY injection Inject 0.4 mLs (200 mcg total) into the skin every 7 (seven) days.  01/08/22   Roxan Hockey, MD  feeding supplement (ENSURE ENLIVE / ENSURE PLUS) LIQD Take 237 mLs by mouth 2 (two) times daily between meals. 10/28/21   Manuella Ghazi, Pratik D, DO  furosemide (LASIX) 40 MG tablet Take 1 tablet (40 mg total) by mouth daily. 01/08/22   Emokpae, Courage, MD  GVOKE PFS 0.5 MG/0.1ML SOSY Inject 0.5 mg into the skin as needed (low bs). 02/08/21   [provider]  hydrALAZINE (APRESOLINE) 100 MG tablet Take 1 tablet (100 mg total) by mouth 3 (three) times daily. Patient taking differently: Take 100 mg by mouth 2 (two) times daily. 10/22/18   Orson Eva, MD  insulin aspart (NOVOLOG) 100 UNIT/ML injection NovoLog insulin per sliding scale glucose 0-120--no units; 121 to 150-1 unit; 151 to 200-2 units; 201 to 250-3 units; 251 to 300-5 units; 30 to 350-7 units; 351 to 400-9 units 01/08/22   Roxan Hockey, MD  Insulin Pen Needle (PEN NEEDLES) 32G X 4 MM MISC Use as directed 06/23/20   Jonetta Osgood, MD  iron sucrose 100 mg in sodium chloride 0.9 % 100 mL Inject 100 mg into the vein every Monday, Wednesday, and Friday with hemodialysis. 01/09/22   Roxan Hockey, MD  lidocaine (LIDODERM) 5 % Place 1 patch onto the skin daily. Remove & Discard patch within 12 hours or as directed by MD 04/16/21   Evalee Jefferson, PA-C  sodium zirconium cyclosilicate (LOKELMA) 10 g PACK packet Take 10 g by mouth every Monday, Wednesday, and Friday. 12/26/21   Tat,  Shanon Brow, MD      Allergies    Patient has no known allergies.    Review of Systems   Review of Systems  All other systems reviewed and are negative.   Physical Exam Updated Vital Signs BP (!) 168/112   Pulse 88   Temp 98 F (36.7 C)   Resp 20   Ht 5' 9" (1.753 m)   Wt 102 kg   SpO2 98%   BMI 33.21 kg/m  Physical Exam Vitals and nursing note reviewed.  Constitutional:      General: She is not in acute distress.    Appearance: She is well-developed.  HENT:     Head: Normocephalic and atraumatic.  Eyes:      Conjunctiva/sclera: Conjunctivae normal.  Cardiovascular:     Rate and Rhythm: Normal rate and regular rhythm.  Pulmonary:     Effort: Pulmonary effort is normal. No respiratory distress.     Breath sounds: Normal breath sounds. No stridor.  Abdominal:     General: There is no distension.     Tenderness: There is generalized abdominal tenderness.  Skin:    General: Skin is warm and dry.  Neurological:     Mental Status: She is alert and oriented to person, place, and time.     Cranial Nerves: No cranial nerve deficit.  Psychiatric:        Mood and Affect: Mood normal.     ED Results / Procedures / Treatments   Labs (all labs ordered are listed, but only abnormal results are displayed) Labs Reviewed  COMPREHENSIVE METABOLIC PANEL - Abnormal; Notable for the following components:      Result Value   Glucose, Bld 293 (*)    BUN 34 (*)    Creatinine, Ser 2.21 (*)    Calcium 8.7 (*)    Albumin 2.9 (*)    GFR, Estimated 21 (*)    All other components within normal limits  CBC WITH DIFFERENTIAL/PLATELET - Abnormal; Notable for the following components:   RDW 15.9 (*)    All other components within normal limits  LIPASE, BLOOD  URINALYSIS, ROUTINE W REFLEX MICROSCOPIC    EKG None  Radiology CT ABDOMEN PELVIS WO CONTRAST  Result Date: 04/15/2022 CLINICAL DATA:  86 year old female with history of left lower quadrant abdominal pain. EXAM: CT ABDOMEN AND PELVIS WITHOUT CONTRAST TECHNIQUE: Multidetector CT imaging of the abdomen and pelvis was performed following the standard protocol without IV contrast. RADIATION DOSE REDUCTION: This exam was performed according to the departmental dose-optimization program which includes automated exposure control, adjustment of the mA and/or kV according to patient size and/or use of iterative reconstruction technique. COMPARISON:  CT the abdomen and pelvis 02/21/2021. FINDINGS: Lower chest: Cardiomegaly. Atherosclerotic calcifications in the  descending thoracic aorta. Hepatobiliary: No definite suspicious cystic or solid hepatic lesions are confidently identified on today's noncontrast CT examination. Status post cholecystectomy. Pancreas: No definite pancreatic mass or peripancreatic fluid collections or inflammatory changes are noted on today's noncontrast CT examination. Spleen: Unremarkable. Adrenals/Urinary Tract: Unenhanced appearance of the left kidney in bilateral adrenal glands is unremarkable. Several low-attenuation and high attenuation lesions are noted in the right kidney, incompletely characterized on today's noncontrast CT examination, but similar to the prior study from 2022 and likely to represent a combination of small cysts and proteinaceous/hemorrhagic cysts (no imaging follow-up recommended). No hydroureteronephrosis. Urinary bladder is moderately distended, but otherwise unremarkable in appearance. Stomach/Bowel: The appearance of the stomach is normal. There is no pathologic dilatation of  small bowel or colon. Large volume of well-formed stool in the distal colon and rectum. The appendix is not confidently identified and may be surgically absent. Regardless, there are no inflammatory changes noted adjacent to the cecum to suggest the presence of an acute appendicitis at this time. Vascular/Lymphatic: Atherosclerosis in the abdominal aorta and pelvic vasculature. No lymphadenopathy noted in the abdomen or pelvis on today's noncontrast CT examination. Reproductive: IUD present in the uterus. Unenhanced appearance of the uterus and ovaries is otherwise unremarkable. Other: No significant volume of ascites.  No pneumoperitoneum. Musculoskeletal: Diffuse body wall edema. There are no aggressive appearing lytic or blastic lesions noted in the visualized portions of the skeleton. IMPRESSION: 1. No definite acute findings are noted in the abdomen or pelvis to account for the patient's symptoms. 2. However, there is a very large volume of  stool in the distal colon and rectal vault, suggesting constipation and potentially fecal impaction. 3. Diffuse body wall edema. 4. Cardiomegaly. 5. Aortic atherosclerosis. 6. Additional incidental findings, as above. Electronically Signed   By: Vinnie Langton M.D.   On: 04/15/2022 10:41    Procedures Procedures    Medications Ordered in ED Medications  0.9 %  sodium chloride infusion ( Intravenous New Bag/Given 04/15/22 0857)  iohexol (OMNIPAQUE) 9 MG/ML oral solution 500 mL (has no administration in time range)  iohexol (OMNIPAQUE) 9 MG/ML oral solution (has no administration in time range)  labetalol (NORMODYNE) injection 10 mg (has no administration in time range)  fentaNYL (SUBLIMAZE) injection 12.5 mcg (12.5 mcg Intravenous Given 04/15/22 0855)  ondansetron (ZOFRAN) injection 4 mg (4 mg Intravenous Given 04/15/22 0855)    ED Course/ Medical Decision Making/ A&P This patient with a Hx of end-stage renal disease, constipation, recently diagnosed DVT presents to the ED for concern of abdominal pain, this involves an extensive number of treatment options, and is a complaint that carries with it a high risk of complications and morbidity.    The differential diagnosis includes obstruction, infection, constipation   Social Determinants of Health:  Age, end-stage renal disease, recently diagnosed DVT  Additional history obtained:  Additional history and/or information obtained from chart review, notable for evaluation 2 days ago after diagnosis of internal jugular DVT was initiation of anticoagulant, including conversations with vascular   After the initial evaluation, orders, including: Labs CT were initiated.   Patient placed on Cardiac and Pulse-Oximetry Monitors. The patient was maintained on a cardiac monitor.  The cardiac monitored showed an rhythm of 95 sinus normal The patient was also maintained on pulse oximetry. The readings were typically 100% room air normal   On  repeat evaluation of the patient stayed the same Blood pressure increased end of the patient stated that she took her medication this morning, after speaking with her daughter it is clear that that was not the case.  Patient will receive her home medication dosing.  In addition, I discussed the findings with the patient, and she notes that she has had difficulty with stool production.  After discussing benefits of enema, the patient subsequently had a large bowel movement.  However, while doing so it was noted that the patient was having difficulty with ambulation and this is seemingly her current baseline.  Daughter confirms that the patient was essentially dropped off from her rehab facility at the daughter's house, and has been nonambulatory since that time due to weakness. Lab Tests:  I personally interpreted labs.  The pertinent results include: Persistent demonstration of renal dysfunction  Imaging Studies ordered:  I independently visualized and interpreted imaging which showed stool retention, no perforation on the CT abdomen pelvis I agree with the radiologist interpretation  Consultations Obtained:  I requested consultation with the social work, due to the patient's inability to function independently, recent discharge from rehabilitation facility and need for likely return to rehabilitation/nursing  Dispostion / Final MDM:  After consideration of the diagnostic results and the patient's response to treatment, this adult female on dialysis presents with abdominal discomfort.  Patient is awake, alert.  She is notably hypertensive, requiring IV medication, reinitiation of her home medications.  Patient is globally weak, without focality and there is no evidence for stroke.  Patient's evaluation inconsistent with bacteremia, sepsis.  CT suggestive of substantial stool reduction, the patient did have a bowel movement in the ED obviating the need for enema.  She will start a stool softener  regimen, however. Patient quires social work assistance for placement and/or resources.  Final Clinical Impression(s) / ED Diagnoses Final diagnoses:  Generalized abdominal pain  Weakness     Carmin Muskrat, MD 04/15/22 1148

## 2022-04-15 NOTE — ED Triage Notes (Signed)
Pt reports last BM about a week ago.

## 2022-04-15 NOTE — ED Notes (Signed)
Dinner tray given

## 2022-04-15 NOTE — ED Triage Notes (Signed)
Pt arrived via [facility] from her daughters home c/o of umbilical abdominal pain. Pt endorses a little bit of nausea.

## 2022-04-15 NOTE — TOC Initial Note (Addendum)
Transition of Care [facility]) - Initial/Assessment Note    Patient Details  Name: Chelsea Meza MRN: [id number] Date of Birth: 12/01/1935  Transition of Care [facility] CM/SW Contact:    Verdell Carmine, RN Phone Number: 04/15/2022, 5:52 PM  Clinical Narrative:                  Patient presented for umbilical pain. Patient was just discharged from [facility], [place] if [place] was  arranged. Patient cannot get up taking 3 assists. Daughter states she cannot take her home if she cannot get up. PT order placed for assessment, patient will be boarding in ED. [place] with RN, patient was sent home due to running out of bed days, At this time she is not safe to go home as caregiver ( daughter)  cannot care for her.  Spoke to daughter, she stated that  she was discharged from [facility], they were "working on" home health care and equipment   They had spoken to the patient about long term care. [name] states that they didn't do physical therapy like she needed, she would visit her every day. She is on dialysis , 3 days a week. She had spells of hypoglycemia due to not getting snacks. She would not want her to go back to [facility], she would prefer [facility].  Left Message with patient prior to calling daughter. Does not have equipment at home except walker, bedside commode, , looking at lift chairs, Once she is up. Daughter works night shift. They live in a small one room apartment, so it would be difficult to fit a hospital bed in . Plan: PT assessment in AM Likely out of bed days for SNF Likely need long term care or round the clock caregivers ( has Medicaid and medicare) TOC will follow for needs   Expected Discharge Plan: [place] (unsure patient has any days left) Barriers to Discharge: SNF Pending bed offer   Patient Goals and CMS Choice        Expected Discharge Plan and Services Expected Discharge Plan: [place] (unsure patient has  any days left)       Living arrangements for the past 2 months: [place]                                      Prior Living Arrangements/Services Living arrangements for the past 2 months: [place] Lives with:: Facility Resident Patient language and need for interpreter reviewed:: Yes        Need for Family Participation in Patient Care: Yes (Comment) Care giver support system in place?: Yes (comment)   Criminal Activity/Legal Involvement Pertinent to Current Situation/Hospitalization: No - Comment as needed  Activities of Daily Living      Permission Sought/Granted                  Emotional Assessment         Alcohol / Substance Use: Not Applicable Psych Involvement: No (comment)  Admission diagnosis:  arm abd pain Patient Active Problem List   Diagnosis Date Noted   Hyperkalemia 12/24/2021   Acute renal failure superimposed on stage 4 chronic kidney disease, unspecified acute renal failure type ([place]) 12/21/2021   Chronic diastolic CHF (congestive heart failure) ([place]) 12/21/2021   Hypothermia 12/21/2021   Thyroid dysfunction 12/17/2021   Debility 12/16/2021    Class: Acute  A-fib ([place]) 12/16/2021    Class: Chronic   Lower extremity edema 12/16/2021   Pressure injury of skin 02/24/2021   Seizures ([place]) 95/18/8416   Acute metabolic encephalopathy 60/63/0160   Acute lower UTI 02/23/2021   Malnutrition of moderate degree 06/23/2020   Poorly controlled diabetes mellitus ([place]) 06/21/2020   Hypoglycemia associated with type 2 diabetes mellitus ([place]) 06/21/2020   Hypertensive urgency 04/05/2020   Cardiac arrest ([place]) 04/05/2020   Acute congestive heart failure ([place]) 10/18/2018   Hyperosmolar non-ketotic state in patient with type 2 diabetes mellitus ([place]) 10/18/2018   Transient cerebral ischemia 11/05/2017   Hyperglycemia due to diabetes mellitus ([place]) 03/20/2015   Hyperlipidemia 03/20/2015   Diabetic hyperosmolar  non-ketotic state ([place]) 07/14/2012   DKA, type 2 ([place]) 07/13/2012   Acute kidney injury superimposed on CKD ([place]) 07/13/2012   Paroxysmal supraventricular tachycardia ([place]) 07/13/2012   Paraparesis ([place]) 07/13/2012   Paresthesia of left leg 07/13/2012   Essential hypertension 07/13/2012   Paresthesia of skin 07/13/2012   PCP:  Hal Morales, DO Pharmacy:   [facility], [place] - [address] [place] [address] Phone: [phone] Fax: [phone]     Social Determinants of Health (SDOH) Interventions    Readmission Risk Interventions    01/05/2022    3:18 PM 12/22/2021    2:27 PM 02/24/2021   12:38 PM  Readmission Risk Prevention Plan  Transportation Screening Complete Complete Complete  PCP or Specialist Appt within 3-5 Days  Complete   HRI or Home Care Consult  Complete Patient refused  Social Work Consult for [place] Planning/Counseling  Complete Complete  Palliative Care Screening  Not Applicable Not Applicable  Medication Review Press photographer) Complete Complete Complete  PCP or Specialist appointment within 3-5 days of discharge Not Complete    HRI or [place] Complete    SW Recovery Care/Counseling Consult Complete    Palliative Care Screening Not [place] Patient Refused

## 2022-04-16 DIAGNOSIS — R1084 Generalized abdominal pain: Secondary | ICD-10-CM | POA: Diagnosis not present

## 2022-04-16 LAB — CBG MONITORING, ED
CBG: 184
Glucose-Capillary: 281 mg/dL — ABNORMAL HIGH (ref 70–99)
Glucose-Capillary: 221 mg/dL — ABNORMAL HIGH (ref 70–99)
Glucose-Capillary: 219 mg/dL — ABNORMAL HIGH (ref 70–99)
Glucose-Capillary: 184 mg/dL — ABNORMAL HIGH (ref 70–99)

## 2022-04-16 NOTE — ED Notes (Signed)
Pt has dentures full top and bottom. Pt stated continent but unable to ambulate. Pt able to take meds po whole without any issues. NO skin issues.

## 2022-04-16 NOTE — Plan of Care (Signed)
  Problem: Acute Rehab PT Goals(only PT should resolve) Goal: Pt Will Go Supine/Side To Sit Description: Min A  Flowsheets (Taken 04/16/2022 1428) Pt will go Supine/Side to Sit: with minimal assist Goal: Patient Will Transfer Sit To/From Stand Description: Min A  Flowsheets (Taken 04/16/2022 1428) Patient will transfer sit to/from stand: with minimal assist Goal: Pt Will Transfer Bed To Chair/Chair To Bed Description: Mod A Flowsheets (Taken 04/16/2022 1428) Pt will Transfer Bed to Chair/Chair to Bed: with mod assist Goal: Pt Will Ambulate Description: 15 feet with RW and Mod A  Flowsheets (Taken 04/16/2022 1428) Pt will Ambulate:  15 feet  with rolling walker   2:29 PM, 04/16/22 Josue Hector PT DPT  Physical Therapist with [facility]  [phone]

## 2022-04-16 NOTE — Evaluation (Signed)
Physical Therapy Evaluation Patient Details Name: Chelsea Meza MRN: [id number] DOB: 1935-07-04 Today's Date: 04/16/2022  History of Present Illness  Patient presented for umbilical pain. Patient was just discharged from [facility] [place] if [place] was  arranged. Patient cannot get up taking 3 assists. Daughter states she cannot take her home if she cannot get up. PT order placed for assessment, patient will be boarding in ED. [place] with RN, patient was sent home due to running out of bed days, At this time she is not safe to go home as caregiver ( daughter)  cannot care for her.   Spoke to daughter, she stated that  she was discharged from [facility], they were "working on" home health care and equipment   They had spoken to the patient about long term care. [name] states that they didn't do physical therapy like she needed, she would visit her every day. She is on dialysis , 3 days a week. She had spells of hypoglycemia due to not getting snacks. She would not want her to go back to [facility], she would prefer [facility].  Left Message with patient prior to calling daughter. Does not have equipment at home except walker, bedside commode, , looking at lift chairs, Once she is up. Daughter works night shift. They live in a small one room apartment, so it would be difficult to fit a hospital bed in .   Clinical Impression  Patient presents supine in bed. She is awake, alert and cooperative. Patient demos significant BLE weakness, which she reports is relatively new. She is currently Max A for bed mobility and transfers due to LE weakness. She is apprehensive to attempt standing, but was able to stand at bedside for a few seconds with Max A after several tries. Patient fatigues quickly. Unable to ambulate at this time due to apprehension and LE weakness. Patient states she is most comfortable with a wheelchair for transport. Patient Max A to return to bed and reposition in upright  posture. Patient left in bed with HOB elevated, phone and call bell within reach. Patient will benefit from continued physical therapy in hospital and recommended venue below to increase strength, balance, endurance for safe ADLs and gait.       Recommendations for follow up therapy are one component of a multi-disciplinary discharge planning process, led by the attending physician.  Recommendations may be updated based on patient status, additional functional criteria and insurance authorization.  Follow Up Recommendations Skilled nursing-short term rehab (<3 hours/day) Can patient physically be transported by private vehicle: Yes    Assistance Recommended at Discharge Frequent or constant Supervision/Assistance  Patient can return home with the following  A lot of help with walking and/or transfers;A lot of help with bathing/dressing/bathroom;Assist for transportation;Help with stairs or ramp for entrance    Equipment Recommendations Rolling walker (2 wheels)  Recommendations for Other Services       Functional Status Assessment Patient has had a recent decline in their functional status and demonstrates the ability to make significant improvements in function in a reasonable and predictable amount of time.     Precautions / Restrictions Precautions Precautions: Fall Restrictions Weight Bearing Restrictions: No      Mobility  Bed Mobility Overal bed mobility: Needs Assistance Bed Mobility: Supine to Sit, Sit to Supine     Supine to sit: Max assist Sit to supine: Max assist        Transfers Overall transfer level: Needs assistance Equipment used:  Rolling walker (2 wheels) Transfers: Sit to/from Stand Sit to Stand: Max assist                Ambulation/Gait                  Stairs            Wheelchair Mobility    Modified Rankin (Stroke Patients Only)       Balance Overall balance assessment: Needs assistance Sitting-balance support:  Feet supported, Bilateral upper extremity supported Sitting balance-Leahy Scale: Poor     Standing balance support: Bilateral upper extremity supported, Reliant on assistive device for balance Standing balance-Leahy Scale: Zero                               Pertinent Vitals/Pain Pain Assessment Pain Assessment: No/denies pain    Home Living Family/patient expects to be discharged to:: Assisted living                 Home Equipment: Kasandra Knudsen - single point;Wheelchair - manual      Prior Function Prior Level of Function : Needs assist       Physical Assist : Mobility (physical);ADLs (physical)       ADLs Comments: Pt reports Independent ADL with assist from family for IADL's.     Hand Dominance        Extremity/Trunk Assessment   Upper Extremity Assessment Upper Extremity Assessment: Defer to OT evaluation    Lower Extremity Assessment Lower Extremity Assessment: Generalized weakness       Communication   Communication: No difficulties  Cognition Arousal/Alertness: Awake/alert Behavior During Therapy: WFL for tasks assessed/performed Overall Cognitive Status: Within Functional Limits for tasks assessed                                          General Comments      Exercises     Assessment/Plan    PT Assessment Patient needs continued PT services  PT Problem List Decreased strength;Decreased range of motion;Decreased activity tolerance;Decreased balance;Decreased mobility       PT Treatment Interventions Gait training;DME instruction;Therapeutic exercise;Therapeutic activities;Balance training;Neuromuscular re-education;Manual techniques;Wheelchair mobility training;Patient/family education    PT Goals (Current goals can be found in the Care Plan section)  Acute Rehab PT Goals Patient Stated Goal: Obtain LTAC placement PT Goal Formulation: With patient Time For Goal Achievement: 04/30/22 Potential to Achieve Goals:  Good    Frequency Min 3X/week     Co-evaluation               AM-PAC PT "6 Clicks" Mobility  Outcome Measure Help needed turning from your back to your side while in a flat bed without using bedrails?: A Lot Help needed moving from lying on your back to sitting on the side of a flat bed without using bedrails?: A Lot Help needed moving to and from a bed to a chair (including a wheelchair)?: A Lot Help needed standing up from a chair using your arms (e.g., wheelchair or bedside chair)?: A Lot Help needed to walk in hospital room?: Total Help needed climbing 3-5 steps with a railing? : Total 6 Click Score: 10    End of Session Equipment Utilized During Treatment: Gait belt Activity Tolerance: Patient tolerated treatment well;Patient limited by fatigue Patient left: in bed;with call bell/phone within reach  Nurse Communication: Mobility status PT Visit Diagnosis: Unsteadiness on feet (R26.81);Difficulty in walking, not elsewhere classified (R26.2);Muscle weakness (generalized) (M62.81)    Time: 1340-1405 PT Time Calculation (min) (ACUTE ONLY): 25 min   Charges:   PT Evaluation $PT Eval Low Complexity: 1 Low        2:25 PM, 04/16/22 Josue Hector PT DPT  Physical Therapist with [facility]  [phone]

## 2022-04-16 NOTE — TOC Progression Note (Signed)
Transition of Care [facility]) - Progression Note    Patient Details  Name: TYLYN STANKOVICH MRN: [id number] Date of Birth: 1936-01-29  Transition of [facility] CM/SW Contact  Salome Arnt, [place] Phone Number: 04/16/2022, 9:23 AM  Clinical Narrative:   LCSW spoke with Jackelyn Poling at [facility] and confirmed pt has used all Medicare days. Per Jackelyn Poling, daughter took patient home because she did not want to turn over check to SNF. LCSW discussed with daughter, [name]. [name] reports she cannot manage pt at home and is agreeable to placement and understands pt's check will go to SNF. She requests [facility] and states her mom will not go back to [facility]. LCSW shared that [facility] does not usually take long term Medicaid patients. [name] agreeable to [place] as well and understands that dialysis will also likely have to be moved. TOC initiated bed search. RN updated.     Expected Discharge Plan: [place] (unsure patient has any days left) Barriers to Discharge: SNF Pending bed offer  Expected Discharge Plan and Services Expected Discharge Plan: [place] (unsure patient has any days left)       Living arrangements for the past 2 months: [place]                                       Social Determinants of Health (SDOH) Interventions    Readmission Risk Interventions    01/05/2022    3:18 PM 12/22/2021    2:27 PM 02/24/2021   12:38 PM  Readmission Risk Prevention Plan  Transportation Screening Complete Complete Complete  PCP or Specialist Appt within 3-5 Days  Complete   HRI or Home Care Consult  Complete Patient refused  Social Work Consult for [place] Planning/Counseling  Complete Complete  Palliative Care Screening  Not Applicable Not Applicable  Medication Review Press photographer) Complete Complete Complete  PCP or Specialist appointment within 3-5 days of discharge Not Complete    HRI or [place]  Complete    SW Recovery Care/Counseling Consult Complete    Palliative Care Screening Not [place] Patient Refused

## 2022-04-16 NOTE — ED Notes (Signed)
PT here to eval.

## 2022-04-16 NOTE — ED Provider Notes (Signed)
Emergency Medicine Observation Re-evaluation Note  Chelsea Meza is a 86 y.o. female, seen on rounds today.  Pt initially presented to the ED for complaints of Abdominal Pain Currently, the patient is without comlaint  Physical Exam  BP (!) 188/106   Pulse 87   Temp 97.8 F (36.6 C) (Oral)   Resp 20   Ht 1.753 m ('5\' 9"'$ )   Wt 102 kg   SpO2 98%   BMI 33.21 kg/m  Physical Exam General: no acute distress Cardiac: normal rate and rhythm Lungs: no coughing - norma lungs sounds Psych: calm and redirectable.  ED Course / MDM  EKG:   I have reviewed the labs performed to date as well as medications administered while in observation.  Recent changes in the last 24 hours include awaiting eval and placement by SW.  Plan  Current plan is for continue ED observation for placement. PT/ OT ordered    Noemi Chapel, MD 04/16/22 1037

## 2022-04-16 NOTE — NC FL2 (Addendum)
[place] LEVEL OF CARE FORM     IDENTIFICATION  Patient Name: Chelsea Meza Birthdate: 1935-11-15 Sex: female Admission Date (Current Location): 04/15/2022  [place] and [place] Number:  [place] [id number] [employer] and Address:  [place] [address], [place]      Provider Number: [id number]  Attending Physician Name and Address:  Default, Provider, MD  Relative Name and Phone Number:       Current Level of Care: Hospital Recommended Level of Care: Nursing Facility Prior Approval Number:    Date Approved/Denied:   PASRR Number: [id number]  Discharge Plan: SNF    Current Diagnoses: Patient Active Problem List   Diagnosis Date Noted   Hyperkalemia 12/24/2021   Acute renal failure superimposed on stage 4 chronic kidney disease, unspecified acute renal failure type ([place]) 12/21/2021   Chronic diastolic CHF (congestive heart failure) ([place]) 12/21/2021   Hypothermia 12/21/2021   Thyroid dysfunction 12/17/2021   Debility 12/16/2021   A-fib ([place]) 12/16/2021   Lower extremity edema 12/16/2021   Pressure injury of skin 02/24/2021   Seizures ([place]) 77/82/4235   Acute metabolic encephalopathy 36/14/4315   Acute lower UTI 02/23/2021   Malnutrition of moderate degree 06/23/2020   Poorly controlled diabetes mellitus ([place]) 06/21/2020   Hypoglycemia associated with type 2 diabetes mellitus ([place]) 06/21/2020   Hypertensive urgency 04/05/2020   Cardiac arrest ([place]) 04/05/2020   Acute congestive heart failure ([place]) 10/18/2018   Hyperosmolar non-ketotic state in patient with type 2 diabetes mellitus ([place]) 10/18/2018   Transient cerebral ischemia 11/05/2017   Hyperglycemia due to diabetes mellitus ([place]) 03/20/2015   Hyperlipidemia 03/20/2015   Diabetic hyperosmolar non-ketotic state ([place]) 07/14/2012   DKA, type 2 ([place]) 07/13/2012   Acute kidney injury superimposed on CKD ([place]) 07/13/2012   Paroxysmal supraventricular tachycardia  ([place]) 07/13/2012   Paraparesis ([place]) 07/13/2012   Paresthesia of left leg 07/13/2012   Essential hypertension 07/13/2012   Paresthesia of skin 07/13/2012    Orientation RESPIRATION BLADDER Height & Weight     Self, Time, Situation, Place  Normal External catheter Weight: 224 lb 13.9 oz (102 kg) Height:  _0  (175.3 cm)  BEHAVIORAL SYMPTOMS/MOOD NEUROLOGICAL BOWEL NUTRITION STATUS      Incontinent Diet (Heart healthy diet. See d/c summary for updates.)  AMBULATORY STATUS COMMUNICATION OF NEEDS Skin   Total Care Verbally Normal                       Personal Care Assistance Level of Assistance  Bathing, Feeding, Dressing Bathing Assistance: Maximum assistance Feeding assistance: Limited assistance Dressing Assistance: Maximum assistance     Functional Limitations Info  Sight, Hearing, Speech Sight Info: Adequate Hearing Info: Adequate Speech Info: Adequate    SPECIAL CARE FACTORS FREQUENCY                       Contractures      Additional Factors Info  Allergies, Insulin Sliding Scale   Allergies Info: No known allergies           Current Medications (04/16/2022):  This is the current hospital active medication list Current Facility-Administered Medications  Medication Dose Route Frequency Provider Last Rate Last Admin   0.9 %  sodium chloride infusion   Intravenous Continuous Carmin Muskrat, MD   Stopped at 04/15/22 1800   acetaminophen (TYLENOL) tablet 650 mg  650 mg Oral Q6H PRN Carmin Muskrat, MD   650 mg at 04/15/22 1824  amLODipine (NORVASC) tablet 10 mg  10 mg Oral Daily Carmin Muskrat, MD   10 mg at 04/15/22 1507   apixaban (ELIQUIS) tablet 2.5 mg  2.5 mg Oral BID Carmin Muskrat, MD   2.5 mg at 04/15/22 2126   atorvastatin (LIPITOR) tablet 40 mg  40 mg Oral QHS Carmin Muskrat, MD   40 mg at 04/15/22 2126   carvedilol (COREG) tablet 3.125 mg  3.125 mg Oral BID WC Carmin Muskrat, MD   3.125 mg at 04/15/22 1822   furosemide (LASIX)  tablet 40 mg  40 mg Oral Daily Carmin Muskrat, MD   40 mg at 04/15/22 1507   hydrALAZINE (APRESOLINE) tablet 100 mg  100 mg Oral BID Carmin Muskrat, MD   100 mg at 04/15/22 2126   insulin aspart (novoLOG) injection 1-9 Units  1-9 Units Subcutaneous TID [facility] Carmin Muskrat, MD   5 Units at 04/15/22 1822   labetalol (NORMODYNE) injection 10 mg  10 mg Intravenous Q10 min PRN Carmin Muskrat, MD       Current Outpatient Medications  Medication Sig Dispense Refill   acetaminophen (TYLENOL) 325 MG tablet Take 2 tablets (650 mg total) by mouth every 6 (six) hours as needed for mild pain, fever or headache (or Fever >/= 101). 120 tablet 0   amLODipine (NORVASC) 10 MG tablet Take 1 tablet (10 mg total) by mouth daily. 90 tablet 1   apixaban (ELIQUIS) 2.5 MG TABS tablet Take 1 tablet (2.5 mg total) by mouth 2 (two) times daily. 180 tablet 0   atorvastatin (LIPITOR) 40 MG tablet Take 40 mg by mouth at bedtime.     carvedilol (COREG) 3.125 MG tablet Take 1 tablet (3.125 mg total) by mouth 2 (two) times daily with a meal. 60 tablet 1   feeding supplement (ENSURE ENLIVE / ENSURE PLUS) LIQD Take 237 mLs by mouth 2 (two) times daily between meals. 237 mL 12   furosemide (LASIX) 40 MG tablet Take 1 tablet (40 mg total) by mouth daily. 30 tablet 2   GVOKE PFS 0.5 MG/0.1ML SOSY Inject 0.5 mg into the skin as needed (low bs).     hydrALAZINE (APRESOLINE) 100 MG tablet Take 1 tablet (100 mg total) by mouth 3 (three) times daily. (Patient taking differently: Take 100 mg by mouth 2 (two) times daily.) 90 tablet 1   insulin lispro (HUMALOG) 100 UNIT/ML injection Inject 0-9 Units into the skin See admin instructions. Per sliding scale 0-120=0 units;121-150=1 unit;151-200=2 units;201-250= 3 units;251-300= 5 units;301-350=7 units;351-400= 9 units.     iron sucrose 100 mg in sodium chloride 0.9 % 100 mL Inject 100 mg into the vein every Monday, Wednesday, and Friday with hemodialysis. 3 ampule 2   lidocaine (LIDODERM) 5  % Place 1 patch onto the skin daily. Remove & Discard patch within 12 hours or as directed by MD 30 patch 0   senna-docusate (SENOKOT-S) 8.6-50 MG tablet Take 1 tablet by mouth daily. 7 tablet 0   sodium zirconium cyclosilicate (LOKELMA) 10 g PACK packet Take 10 g by mouth every Monday, Wednesday, and Friday.     Accu-Chek FastClix Lancets MISC      ACCU-CHEK GUIDE test strip USE TO TEST TWICE DAILY.AL     Blood Glucose Monitoring Suppl (ACCU-CHEK GUIDE) w/Device KIT USE AS DIRECTED.I     Darbepoetin Alfa (ARANESP) 200 MCG/0.4ML SOSY injection Inject 0.4 mLs (200 mcg total) into the skin every 7 (seven) days. (Patient not taking: Reported on 04/15/2022) 1.68 mL 3   insulin  aspart (NOVOLOG) 100 UNIT/ML injection NovoLog insulin per sliding scale glucose 0-120--no units; 121 to 150-1 unit; 151 to 200-2 units; 201 to 250-3 units; 251 to 300-5 units; 30 to 350-7 units; 351 to 400-9 units (Patient not taking: Reported on 04/15/2022) 10 mL 11   Insulin Pen Needle (PEN NEEDLES) 32G X 4 MM MISC Use as directed 100 each 11     Discharge Medications: Please see discharge summary for a list of discharge medications.  Relevant Imaging Results:  Relevant Lab Results:   Additional Information SSN: [id number]. Pt has used Medicare days and will be Medicaid only. Dialysis MWF [facility].  Salome Arnt, LCSW

## 2022-04-17 DIAGNOSIS — R1084 Generalized abdominal pain: Secondary | ICD-10-CM | POA: Diagnosis not present

## 2022-04-17 LAB — HEPATITIS B SURFACE ANTIGEN: Hepatitis B Surface Ag: NONREACTIVE

## 2022-04-17 LAB — CBG MONITORING, ED
Glucose-Capillary: 188 mg/dL — ABNORMAL HIGH (ref 70–99)
Glucose-Capillary: 205 mg/dL — ABNORMAL HIGH (ref 70–99)
Glucose-Capillary: 241 mg/dL — ABNORMAL HIGH (ref 70–99)

## 2022-04-17 MED ORDER — APIXABAN 5 MG PO TABS
10.0000 mg | ORAL_TABLET | Freq: Two times a day (BID) | ORAL | Status: DC
  Administered 2022-04-17 – 2022-04-21 (×8): 10 mg via ORAL
  Filled 2022-04-17 (×8): qty 2

## 2022-04-17 MED ORDER — CHLORHEXIDINE GLUCONATE CLOTH 2 % EX PADS: 6.0000 | MEDICATED_PAD | Freq: Every day | CUTANEOUS | Status: DC

## 2022-04-17 MED ORDER — APIXABAN 5 MG PO TABS: 5.0000 mg | ORAL_TABLET | Freq: Two times a day (BID) | ORAL | Status: DC

## 2022-04-17 NOTE — ED Provider Notes (Signed)
Spoke with pharmacist Cristy Friedlander, [place] in regards to eliquis. Pt has DVT to her IJ per prior report, okay to continue HD per prior EDP d/w vascular. Will adjust eliquis dosing for treatment dose of DVT instead of afib dose following d/w pharmacy.   Wynona Dove A, DO 04/17/22 1413

## 2022-04-17 NOTE — ED Provider Notes (Signed)
Emergency Medicine Observation Re-evaluation Note  Chelsea Meza is a 86 y.o. female, seen on rounds today.  Pt initially presented to the ED for complaints of Abdominal Pain Currently, the patient is resting on stretcher.  Physical Exam  BP (!) 156/107 (BP Location: Right Arm)   Pulse 81   Temp 98.3 F (36.8 C) (Oral)   Resp 18   Ht '5\' 9"'$  (1.753 m)   Wt 102 kg   SpO2 97%   BMI 33.21 kg/m  Physical Exam General: nad Psych: calm, cooperative  ED Course / MDM  EKG:   I have reviewed the labs performed to date as well as medications administered while in observation.  Recent changes in the last 24 hours include pending placement. PT was able to work w/ pt.  Plan  Current plan is for placement via TOC/SW.    Jeanell Sparrow, DO 04/17/22 [phone]

## 2022-04-17 NOTE — ED Notes (Signed)
Pt has been placed in a brief and chucks placed underneath. Per pt request.

## 2022-04-17 NOTE — ED Notes (Signed)
Breakfast tray given. °

## 2022-04-17 NOTE — Progress Notes (Signed)
I see that pt is here- plan appears to be to board in the ER until placement can be achieved.  She is currently in NAD-  feels better after BM-  PT has evaluated and recommended short term SNF.  \  We need to arrange routine HD-  as there are many pts to run today and pt does not demonstrate NEED for HD, the plan will be to run her tomorrow off schedule   Regular HD orders [facility]- MWF- 3 hours and 45 minutes-  TDC-  400 BFR 3k bath, EDW 83.5 no heparin-  calcitriol 0.5 mcg qtx  Louis Meckel

## 2022-04-17 NOTE — ED Notes (Signed)
Lunch tray given. 

## 2022-04-17 NOTE — Social Work (Signed)
Patient have  been denied at [facility] [place] [place]. CSW reached out to supervisor, she stated that she would reach out to [place] to see if she could take the patient. TOC will update as they come.

## 2022-04-17 NOTE — Progress Notes (Addendum)
This CSW has followed up with [facility] [place] inquiring about what additional clinical information is needed?. TOC following.   Addend @ 2:09 PM This CSW contacted the pt's daughter and provided an update.   Addend @ 3:38 PM TOC supervisor, Beverlee Nims, contacted [name] at [place] to inquire about the pt returning. Jackelyn Poling states she will speak with her administrator and get back with her. Report given to [place] to follow up.

## 2022-04-17 NOTE — ED Notes (Signed)
Pt resting comfortably at this time.

## 2022-04-18 DIAGNOSIS — R109 Unspecified abdominal pain: Secondary | ICD-10-CM | POA: Diagnosis not present

## 2022-04-18 DIAGNOSIS — I120 Hypertensive chronic kidney disease with stage 5 chronic kidney disease or end stage renal disease: Secondary | ICD-10-CM | POA: Diagnosis not present

## 2022-04-18 DIAGNOSIS — Z992 Dependence on renal dialysis: Secondary | ICD-10-CM | POA: Diagnosis not present

## 2022-04-18 DIAGNOSIS — D631 Anemia in chronic kidney disease: Secondary | ICD-10-CM | POA: Diagnosis not present

## 2022-04-18 DIAGNOSIS — N250 Renal osteodystrophy: Secondary | ICD-10-CM | POA: Diagnosis not present

## 2022-04-18 DIAGNOSIS — R1084 Generalized abdominal pain: Secondary | ICD-10-CM | POA: Diagnosis not present

## 2022-04-18 DIAGNOSIS — N186 End stage renal disease: Secondary | ICD-10-CM | POA: Diagnosis not present

## 2022-04-18 LAB — CBC
HCT: 36.3 % (ref 36.0–46.0)
Hemoglobin: 11.3 g/dL — ABNORMAL LOW (ref 12.0–15.0)
MCH: 26.8 pg (ref 26.0–34.0)
MCHC: 31.1 g/dL (ref 30.0–36.0)
MCV: 86.2 fL (ref 80.0–100.0)
Platelets: 226 10*3/uL (ref 150–400)
RBC: 4.21 MIL/uL (ref 3.87–5.11)
RDW: 16.3 % — ABNORMAL HIGH (ref 11.5–15.5)
WBC: 6.9 10*3/uL (ref 4.0–10.5)
nRBC: 0 % (ref 0.0–0.2)

## 2022-04-18 LAB — RENAL FUNCTION PANEL
Albumin: 2.4 g/dL — ABNORMAL LOW (ref 3.5–5.0)
Anion gap: 10 (ref 5–15)
BUN: 59 mg/dL — ABNORMAL HIGH (ref 8–23)
CO2: 27 mmol/L (ref 22–32)
Calcium: 8.4 mg/dL — ABNORMAL LOW (ref 8.9–10.3)
Chloride: 98 mmol/L (ref 98–111)
Creatinine, Ser: 3.46 mg/dL — ABNORMAL HIGH (ref 0.44–1.00)
GFR, Estimated: 13 mL/min — ABNORMAL LOW (ref >60)
Glucose, Bld: 251 mg/dL — ABNORMAL HIGH (ref 70–99)
Phosphorus: 4.7 mg/dL — ABNORMAL HIGH (ref 2.5–4.6)
Potassium: 4.3 mmol/L (ref 3.5–5.1)
Sodium: 135 mmol/L (ref 135–145)

## 2022-04-18 LAB — CBG MONITORING, ED
Glucose-Capillary: 163 mg/dL — ABNORMAL HIGH (ref 70–99)
Glucose-Capillary: 169 mg/dL — ABNORMAL HIGH (ref 70–99)

## 2022-04-18 LAB — HEPATITIS B SURFACE ANTIBODY, QUANTITATIVE: Hep B S AB Quant (Post): <3.1 m[IU]/mL — ABNORMAL LOW (ref >9.9)

## 2022-04-18 MED ORDER — HEPARIN SODIUM (PORCINE) 1000 UNIT/ML IJ SOLN
INTRAMUSCULAR | Status: AC
  Administered 2022-04-18: 3200 [IU]
  Filled 2022-04-18: qty 5

## 2022-04-18 MED ORDER — ALTEPLASE 2 MG IJ SOLR: 2.0000 mg | Freq: Once | INTRAMUSCULAR | Status: DC | PRN

## 2022-04-18 MED ORDER — HEPARIN SODIUM (PORCINE) 1000 UNIT/ML DIALYSIS
1000.0000 [IU] | INTRAMUSCULAR | Status: DC | PRN
  Administered 2022-04-20: 1000 [IU]

## 2022-04-18 NOTE — Procedures (Addendum)
   HEMODIALYSIS TREATMENT NOTE:  Uneventful 3.5 hour heparin-free treatment completed using RIJ TDC.  Cath entry site is unremarkable. Goal met: 2.7 liters removed without interruption in UF.  All blood was returned.  Hand-off given to Theodoro Doing, RN.  Rockwell Alexandria, RN

## 2022-04-18 NOTE — ED Notes (Addendum)
CSW and [facility] supervisor spoke to [place] with [facility] who states they are not able to accept pt back to their facility.   CSW supervisor spoke to [facility] admissions who will consider pt pending bedside assessment. They will attempt to complete assessment today but possibly tomorrow. TOC to follow for updates.   CSW updated pts daughter of current plan for assessment to be completed by [facility]. TOC to follow.

## 2022-04-18 NOTE — ED Notes (Signed)
Pt eating dinner- sandwich,chips, & diet ginger ale

## 2022-04-18 NOTE — ED Provider Notes (Signed)
Emergency Medicine Observation Re-evaluation Note  Chelsea Meza is a 86 y.o. female, seen on rounds today.  Pt initially presented to the ED for complaints of Abdominal Pain Currently, the patient is asleep.  Physical Exam  BP (!) 185/99 (BP Location: Left Arm)   Pulse 85   Temp 97.8 F (36.6 C)   Resp 17   Ht '5\' 9"'$  (1.753 m)   Wt 102 kg   SpO2 97%   BMI 33.21 kg/m  Physical Exam General: nad Lungs: equal chest rise noted   ED Course / MDM  EKG:   I have reviewed the labs performed to date as well as medications administered while in observation.  Recent changes in the last 24 hours include doac dose adjustment via consultation w/ pharmacy. Still looking for placement  Plan  Current plan is for placement.    Jeanell Sparrow, DO 04/18/22 [phone]

## 2022-04-18 NOTE — Progress Notes (Signed)
  [facility] Progress Note    Assessment/ Plan:   Problem list: ESRD on HD MWF via [place] Abd pain-per primary service HTN/Volume Anemia of CKD. Hgb 13.3 on 12/16 Secondary Hyperparathyroidism  Plan: -Off schedule for HD this week given inpatient HD census. Will plan for HD today. No heparin -labs with HD today -will UF as tolerated -will cont to follow. Pending placement   Regular HD orders [facility]- MWF- 3 hours and 45 minutes-  TDC-  400 BFR 3k bath, EDW 83.5 no heparin-  calcitriol 0.5 mcg qtx   Gean Quint, MD [facility]  Subjective:   No acute events, pending placement She does report pain in her feet since yesterday, she feels as if fluid is building up in her legs   Objective:   BP (!) 185/99 (BP Location: Left Arm)   Pulse 85   Temp 97.8 F (36.6 C)   Resp 17   Ht '5\' 9"'$  (1.753 m)   Wt 102 kg   SpO2 97%   BMI 33.21 kg/m  No intake or output data in the 24 hours ending 04/18/22 0825 Weight change:   Physical Exam: Gen:NAD CVS:RRR Resp:normal WOB LGX:QJJH Ext: trace edema b/l LEs Neuro: awake, alert Dialysis access: RIJ TDC c/d/i  Imaging: No results found.  Labs: BMET Recent Labs  Lab 04/15/22 0752  NA 136  K 3.9  CL 99  CO2 29  GLUCOSE 293*  BUN 34*  CREATININE 2.21*  CALCIUM 8.7*   CBC Recent Labs  Lab 04/15/22 0752  WBC 7.7  NEUTROABS 5.5  HGB 13.3  HCT 41.9  MCV 85.3  PLT 227    Medications:     amLODipine  10 mg Oral Daily   apixaban  10 mg Oral BID   Followed by   Derrill Memo ON 04/24/2022] apixaban  5 mg Oral BID   atorvastatin  40 mg Oral QHS   carvedilol  3.125 mg Oral BID WC   Chlorhexidine Gluconate Cloth  6 each Topical Q0600   furosemide  40 mg Oral Daily   hydrALAZINE  100 mg Oral BID   insulin aspart  1-9 Units Subcutaneous TID WC      Gean Quint, MD [facility] 04/18/2022, 8:25 AM

## 2022-04-19 DIAGNOSIS — R1084 Generalized abdominal pain: Secondary | ICD-10-CM | POA: Diagnosis not present

## 2022-04-19 LAB — CBG MONITORING, ED
Glucose-Capillary: 180 mg/dL — ABNORMAL HIGH (ref 70–99)
Glucose-Capillary: 171 mg/dL — ABNORMAL HIGH (ref 70–99)
Glucose-Capillary: 182 mg/dL — ABNORMAL HIGH (ref 70–99)
Glucose-Capillary: 164 mg/dL — ABNORMAL HIGH (ref 70–99)

## 2022-04-19 NOTE — ED Provider Notes (Signed)
Emergency Medicine Observation Re-evaluation Note  Chelsea Meza is a 86 y.o. female, seen on rounds today.  Pt initially presented to the ED for complaints of Abdominal Pain Currently, the patient is resting in NAD.  Physical Exam  BP (!) 153/62 (BP Location: Left Arm)   Pulse 95   Temp 98.6 F (37 C) (Oral)   Resp 17   Ht '5\' 9"'$  (1.753 m)   Wt 102 kg   SpO2 96%   BMI 33.21 kg/m  Physical Exam General: Appears to be resting comfortably in bed, no acute distress. Cardiac: Regular rate, normal heart rate, non-emergent blood pressure for this morning's vitals. Lungs: No increased work of breathing.  Equal chest rise appreciated Psych: Calm, asleep in bed.   ED Course / MDM  EKG:   I have reviewed the labs performed to date as well as medications administered while in observation.   Plan  Current plan is for social work placement.    Tretha Sciara, MD 04/19/22 [phone]

## 2022-04-19 NOTE — Progress Notes (Signed)
  [facility] Progress Note    Assessment/ Plan:   Problem list: ESRD on HD MWF via [place] [facility] pain-per primary service HTN/Volume Anemia of CKD. Hgb acceptable-11.3 on 12/19 Secondary Hyperparathyroidism. PO4 acceptable 4.7 on 12/19  Plan: -Off schedule for HD this week. Will plan for HD tomorrow. No heparin. If census allows, may get her back on MWF sched on Friday or next Monday -cont to check labs with HD -will UF as tolerated -will cont to follow. Patient is pending placement   Regular HD orders [facility]- MWF- 3 hours and 45 minutes-  TDC-  400 BFR 3k bath, EDW 83.5 no heparin-  calcitriol 0.5 mcg qtx   Gean Quint, MD [facility]  Subjective:   No acute events, pending placement. Patient seen and examined in ER. Patient reports that she tolerated HD yesterday (net UF 2.7L). She does report that her legs feel a little less stiff after taking fluid off with HD. Otherwise no complaints. Tolerated HD yesterday with net UF 2.7L.   Objective:   BP (!) 153/62 (BP Location: Left Arm)   Pulse 95   Temp 98.6 F (37 C) (Oral)   Resp 17   Ht '5\' 9"'$  (1.753 m)   Wt 102 kg   SpO2 96%   BMI 33.21 kg/m   Intake/Output Summary (Last 24 hours) at 04/19/2022 0811 Last data filed at 04/18/2022 1522 Gross per 24 hour  Intake --  Output 2700 ml  Net -2700 ml   Weight change:   Physical Exam: Gen:NAD CVS:RRR Resp:normal WOB OVZ:CHYI, NT/ND Ext: trace edema b/l LEs Neuro: awake, alert Dialysis access: RIJ TDC c/d/i  Imaging: No results found.  Labs: BMET Recent Labs  Lab 04/15/22 0752 04/18/22 1315  NA 136 135  K 3.9 4.3  CL 99 98  CO2 29 27  GLUCOSE 293* 251*  BUN 34* 59*  CREATININE 2.21* 3.46*  CALCIUM 8.7* 8.4*  PHOS  --  4.7*   CBC Recent Labs  Lab 04/15/22 0752 04/18/22 1315  WBC 7.7 6.9  NEUTROABS 5.5  --   HGB 13.3 11.3*  HCT 41.9 36.3  MCV 85.3 86.2  PLT 227 226    Medications:     amLODipine  10 mg  Oral Daily   apixaban  10 mg Oral BID   Followed by   Derrill Memo ON 04/24/2022] apixaban  5 mg Oral BID   atorvastatin  40 mg Oral QHS   carvedilol  3.125 mg Oral BID WC   Chlorhexidine Gluconate Cloth  6 each Topical Q0600   furosemide  40 mg Oral Daily   hydrALAZINE  100 mg Oral BID   insulin aspart  1-9 Units Subcutaneous TID WC      Gean Quint, MD [facility] 04/19/2022, 8:11 AM

## 2022-04-19 NOTE — ED Notes (Signed)
Pt alert and verbal . Pt understood she will be a Tues, Thurs, Sat dialysis pt while she is here.

## 2022-04-19 NOTE — Progress Notes (Signed)
Patient has been declined by all facilities.  CM spoke with corporate for [facility] and was informed that no facilities under that umbrella are able to accept patient.

## 2022-04-19 NOTE — Progress Notes (Signed)
Physical Therapy Treatment Patient Details Name: Chelsea Meza MRN: [id number] DOB: 10-02-1935 Today's Date: 04/19/2022   History of Present Illness Patient presented for umbilical pain. Patient was just discharged from [facility] [place] if [place] was  arranged. Patient cannot get up taking 3 assists. Daughter states she cannot take her home if she cannot get up. PT order placed for assessment, patient will be boarding in ED. Kirvin with RN, patient was sent home due to running out of bed days, At this time she is not safe to go home as caregiver ( daughter)  cannot care for her.   Spoke to daughter, she stated that  she was discharged from [facility], they were "working on" home health care and equipment   They had spoken to the patient about long term care. [name] states that they didn't do physical therapy like she needed, she would visit her every day. She is on dialysis , 3 days a week. She had spells of hypoglycemia due to not getting snacks. She would not want her to go back to [facility], she would prefer [facility].  Left Message with patient prior to calling daughter. Does not have equipment at home except walker, bedside commode, , looking at lift chairs, Once she is up. Daughter works night shift. They live in a small one room apartment, so it would be difficult to fit a hospital bed in .    PT Comments    Patient agreeable for therapy.  Patient demonstrates slow labored movement for sitting up at bedside with most difficulty moving BLE due to weakness, fair/good return for completing BLE ROM/strengthening exercises, but required active assistance for completing marching in place due to bilateral hip weakness, tolerated standing with RW for up to 2 minutes and unable to take steps due to feet frequently sliding forward.  Patient put back to bed with Mod/max assist to reposition.  Patient will benefit from continued skilled physical therapy in hospital and recommended  venue below to increase strength, balance, endurance for safe ADLs and gait.      Recommendations for follow up therapy are one component of a multi-disciplinary discharge planning process, led by the attending physician.  Recommendations may be updated based on patient status, additional functional criteria and insurance authorization.  Follow Up Recommendations  Skilled nursing-short term rehab (<3 hours/day) Can patient physically be transported by private vehicle: No   Assistance Recommended at Discharge Intermittent Supervision/Assistance  Patient can return home with the following A lot of help with walking and/or transfers;A lot of help with bathing/dressing/bathroom;Assistance with cooking/housework;Assist for transportation;Help with stairs or ramp for entrance   Equipment Recommendations  Rolling walker (2 wheels)    Recommendations for Other Services       Precautions / Restrictions Precautions Precautions: Fall Restrictions Weight Bearing Restrictions: No     Mobility  Bed Mobility Overal bed mobility: Needs Assistance Bed Mobility: Supine to Sit, Sit to Supine     Supine to sit: Mod assist, Max assist Sit to supine: Mod assist   General bed mobility comments: slow labored movement, diffiuclty movving legs due to weakness    Transfers Overall transfer level: Needs assistance Equipment used: Rolling walker (2 wheels) Transfers: Sit to/from Stand Sit to Stand: Mod assist, Max assist           General transfer comment: has difficulty due to feet sliding forward and BLE weakness    Ambulation/Gait  Stairs             Wheelchair Mobility    Modified Rankin (Stroke Patients Only)       Balance Overall balance assessment: Needs assistance Sitting-balance support: Feet supported, Bilateral upper extremity supported Sitting balance-Leahy Scale: Fair Sitting balance - Comments: fair/good seated at EOB   Standing  balance support: Reliant on assistive device for balance, During functional activity, Bilateral upper extremity supported Standing balance-Leahy Scale: Poor Standing balance comment: using RW                            Cognition Arousal/Alertness: Awake/alert Behavior During Therapy: WFL for tasks assessed/performed Overall Cognitive Status: Within Functional Limits for tasks assessed                                          Exercises General Exercises - Lower Extremity Long Arc Quad: AROM, Strengthening, Both, 10 reps Hip Flexion/Marching: AAROM, Strengthening, Both, 10 reps Toe Raises: AROM, Strengthening, Both, 20 reps Heel Raises: AROM, Strengthening, Both, 20 reps    General Comments        Pertinent Vitals/Pain Pain Assessment Pain Assessment: No/denies pain    Home Living                          Prior Function            PT Goals (current goals can now be found in the care plan section) Acute Rehab PT Goals Patient Stated Goal: return home after rehab PT Goal Formulation: With patient Time For Goal Achievement: 04/30/22 Potential to Achieve Goals: Good Progress towards PT goals: Progressing toward goals    Frequency    Min 2X/week      PT Plan Current plan remains appropriate    Co-evaluation              AM-PAC PT "6 Clicks" Mobility   Outcome Measure  Help needed turning from your back to your side while in a flat bed without using bedrails?: A Lot Help needed moving from lying on your back to sitting on the side of a flat bed without using bedrails?: A Lot Help needed moving to and from a bed to a chair (including a wheelchair)?: A Lot Help needed standing up from a chair using your arms (e.g., wheelchair or bedside chair)?: A Lot Help needed to walk in hospital room?: Total Help needed climbing 3-5 steps with a railing? : Total 6 Click Score: 10    End of Session   Activity Tolerance: Patient  tolerated treatment well;Patient limited by fatigue Patient left: in bed;with call bell/phone within reach Nurse Communication: Mobility status PT Visit Diagnosis: Unsteadiness on feet (R26.81);Other abnormalities of gait and mobility (R26.89);Muscle weakness (generalized) (M62.81)     Time: 0814-4818 PT Time Calculation (min) (ACUTE ONLY): 23 min  Charges:  $Therapeutic Exercise: 8-22 mins $Therapeutic Activity: 8-22 mins                     12:36 PM, 04/19/22 [name], MPT Physical Therapist with [facility] [phone] office [phone] mobile phone

## 2022-04-19 NOTE — ED Notes (Addendum)
CSW spoke to Cl[place] in admissions at [facility] who states they can make a bed offer for pt. Melissa ask that we try for insurance auth. Insurance Josem Kaufmann has been started at this time. CSW attempted to reach pts daughter to provide update, no answer and unable to leave a VM due to VM box being full. CSW sent HIPAA compliant text to daughter requesting she call for update. TOC to follow.   CSW met with pt and pts daughter in ED. Pt and daughter agreeable to placement at [facility]. They are understanding at this time that we will attempt for insurance auth. TOC to follow.

## 2022-04-20 DIAGNOSIS — I120 Hypertensive chronic kidney disease with stage 5 chronic kidney disease or end stage renal disease: Secondary | ICD-10-CM | POA: Diagnosis not present

## 2022-04-20 DIAGNOSIS — D631 Anemia in chronic kidney disease: Secondary | ICD-10-CM | POA: Diagnosis not present

## 2022-04-20 DIAGNOSIS — Z992 Dependence on renal dialysis: Secondary | ICD-10-CM | POA: Diagnosis not present

## 2022-04-20 DIAGNOSIS — R1084 Generalized abdominal pain: Secondary | ICD-10-CM | POA: Diagnosis not present

## 2022-04-20 DIAGNOSIS — N186 End stage renal disease: Secondary | ICD-10-CM | POA: Diagnosis not present

## 2022-04-20 DIAGNOSIS — R109 Unspecified abdominal pain: Secondary | ICD-10-CM | POA: Diagnosis not present

## 2022-04-20 DIAGNOSIS — N250 Renal osteodystrophy: Secondary | ICD-10-CM | POA: Diagnosis not present

## 2022-04-20 LAB — CBG MONITORING, ED
Glucose-Capillary: 127 mg/dL — ABNORMAL HIGH (ref 70–99)
Glucose-Capillary: 182 mg/dL — ABNORMAL HIGH (ref 70–99)

## 2022-04-20 NOTE — ED Provider Notes (Signed)
Emergency Medicine Observation Re-evaluation Note  Chelsea Meza is a 86 y.o. female, seen on rounds today.  Pt initially presented to the ED for complaints of Abdominal Pain Currently, the patient is awaiting placement.  Pt did have dialysis today.  Physical Exam  BP (!) 158/101 (BP Location: Left Arm)   Pulse 83   Temp 97.8 F (36.6 C) (Oral)   Resp 19   Ht '5\' 9"'$  (1.753 m)   Wt 76 kg   SpO2 98%   BMI 24.74 kg/m  Physical Exam General: awake and alert Cardiac: rr Lungs: clear Psych: calm  ED Course / MDM  EKG:   I have reviewed the labs performed to date as well as medications administered while in observation.  Recent changes in the last 24 hours include none.  Plan  Current plan is for SNF placement.    Isla Pence, MD 04/20/22 Einar Crow

## 2022-04-20 NOTE — ED Notes (Signed)
Dialysis nurse at bedside, preparing pt for dialysis tx today

## 2022-04-20 NOTE — ED Notes (Signed)
Pts had urinary incontinence episode, pt cleaned. Brief and bed pad changed, and new purewick placed.

## 2022-04-20 NOTE — Procedures (Signed)
Patient alert and oriented, brought to unit in bed.  Informed consent signed and in chart.   Treatment initiated: 0800 Treatment completed: 1127  Patient tolerated well. Transported back to unit. Patient is alert and not in acute distress.  Hand-off given to patient's nurse, Enriqueta Shutter, RN.  Access used: Cath Access issues: None  Total UF removed: 3 L Medication(s) given: None  Post HD VS: Temperature: 97.7  Blood Pressure: 143/92 (108) Heart Rate: 76  Respiratory Rate: 20 Oxygen Saturation: 100% [place]  Post HD weight: 76 kg  Morton Peters, RN, BSN [facility]

## 2022-04-20 NOTE — Progress Notes (Signed)
  [facility] Progress Note    Assessment/ Plan:   Problem list: ESRD on HD MWF via [place] Abd pain-per primary service HTN/Volume Anemia of CKD. Hgb acceptable-11.3 on 12/19 Secondary Hyperparathyroidism. PO4 acceptable 4.7 on 12/19  Plan: -Off schedule for HD this week. Will plan for HD today. No heparin.  may get her back on MWF sched on  Monday if still here.  If by chance gets to leave today -  her next OP HD will be on Saturday at noon ( holiday schedule), then Tuesday at noon , then Wed at regular time  -cont to check labs with HD -will UF as tolerated -will cont to follow. Patient is pending placement-  has bed offer but needing insurance auth-  not sure how quickly that will happen   Regular HD orders [facility]- MWF- 3 hours and 45 minutes-  TDC-  400 BFR 3k bath, EDW 83.5 no heparin-  calcitriol 0.5 mcg qtx   Chelsea Meza A [facility]  Subjective:   No acute events, pending placement. Has a place but needs insurance auth. Patient seen and examined in ER. Otherwise no complaints.  Due for HD today     Objective:   BP (!) 196/114 (BP Location: Left Arm)   Pulse 95   Temp 97.9 F (36.6 C) (Oral)   Resp 16   Ht '5\' 9"'$  (1.753 m)   Wt 79.1 kg Comment: Standing with assistance in ED on scale  SpO2 96%   BMI 25.75 kg/m  No intake or output data in the 24 hours ending 04/20/22 0739  Weight change:   Physical Exam: Gen:NAD CVS:RRR Resp:normal WOB QQI:WLNL, NT/ND Ext: trace edema b/l LEs Neuro: awake, alert Dialysis access: RIJ TDC c/d/i  Imaging: No results found.  Labs: BMET Recent Labs  Lab 04/15/22 0752 04/18/22 1315  NA 136 135  K 3.9 4.3  CL 99 98  CO2 29 27  GLUCOSE 293* 251*  BUN 34* 59*  CREATININE 2.21* 3.46*  CALCIUM 8.7* 8.4*  PHOS  --  4.7*   CBC Recent Labs  Lab 04/15/22 0752 04/18/22 1315  WBC 7.7 6.9  NEUTROABS 5.5  --   HGB 13.3 11.3*  HCT 41.9 36.3  MCV 85.3 86.2  PLT 227  226    Medications:     amLODipine  10 mg Oral Daily   apixaban  10 mg Oral BID   Followed by   Derrill Memo ON 04/24/2022] apixaban  5 mg Oral BID   atorvastatin  40 mg Oral QHS   carvedilol  3.125 mg Oral BID WC   Chlorhexidine Gluconate Cloth  6 each Topical Q0600   furosemide  40 mg Oral Daily   hydrALAZINE  100 mg Oral BID   insulin aspart  1-9 Units Subcutaneous TID WC      Chelsea Meza A [facility] 04/20/2022, 7:39 AM

## 2022-04-20 NOTE — ED Notes (Addendum)
CSW updated by [facility] that pt has used all insurance days for SNF at this time. CSW updated TOC supervisor. CSW to continue working with [facility] supervisor on plan to get pt to [facility].

## 2022-04-21 DIAGNOSIS — R6889 Other general symptoms and signs: Secondary | ICD-10-CM | POA: Diagnosis not present

## 2022-04-21 DIAGNOSIS — R609 Edema, unspecified: Secondary | ICD-10-CM | POA: Diagnosis not present

## 2022-04-21 DIAGNOSIS — R410 Disorientation, unspecified: Secondary | ICD-10-CM | POA: Diagnosis not present

## 2022-04-21 DIAGNOSIS — Z743 Need for continuous supervision: Secondary | ICD-10-CM | POA: Diagnosis not present

## 2022-04-21 DIAGNOSIS — R279 Unspecified lack of coordination: Secondary | ICD-10-CM | POA: Diagnosis not present

## 2022-04-21 DIAGNOSIS — R1084 Generalized abdominal pain: Secondary | ICD-10-CM | POA: Diagnosis not present

## 2022-04-21 LAB — CBG MONITORING, ED: Glucose-Capillary: 195 mg/dL — ABNORMAL HIGH (ref 70–99)

## 2022-04-21 MED ORDER — CALCITRIOL 0.25 MCG PO CAPS
0.5000 ug | ORAL_CAPSULE | Freq: Every day | ORAL | Status: DC
  Filled 2022-04-21: qty 2

## 2022-04-21 MED ORDER — POLYETHYLENE GLYCOL 3350 17 GM/SCOOP PO POWD: 17.0000 g | Freq: Two times a day (BID) | ORAL | 0 refills | Status: AC

## 2022-04-21 MED ORDER — CHLORHEXIDINE GLUCONATE CLOTH 2 % EX PADS: 6.0000 | MEDICATED_PAD | Freq: Every day | CUTANEOUS | Status: DC

## 2022-04-21 NOTE — ED Provider Notes (Signed)
Emergency Medicine Observation Re-evaluation Note  Chelsea Meza is a 86 y.o. female, seen on rounds today.  Pt initially presented to the ED for complaints of Abdominal Pain Currently, the patient is stable.  Physical Exam  BP (!) 173/97 (BP Location: Left Arm)   Pulse 85   Temp 98.1 F (36.7 C) (Oral)   Resp 18   Ht 1.753 m ('5\' 9"'$ )   Wt 76 kg   SpO2 97%   BMI 24.74 kg/m  Physical Exam General: No distress Cardiac: Normal heart rate Lungs: No respiratory distress Psych: Calm and redirectable  ED Course / MDM  EKG:   I have reviewed the labs performed to date as well as medications administered while in observation.  Recent changes in the last 24 hours include the patient finally has placement at [facility].  Plan  Current plan is for discharge back to [facility] facility.    Noemi Chapel, MD 04/21/22 [phone]

## 2022-04-21 NOTE — Discharge Instructions (Signed)
All of your testing showed that you have some constipation but no other acute findings.  Please take 1 scoop of MiraLAX daily in addition to your home medications  Please follow-up with your doctor within the week for recheck  Thank you for allowing [place] to treat you in the emergency department today.  After reviewing your examination and potential testing that was done it appears that you are safe to go home.  I would like for you to follow-up with your doctor within the next several days, have them obtain your results and follow-up with them to review all of these tests.  If you should develop severe or worsening symptoms return to the emergency department immediately

## 2022-04-21 NOTE — ED Notes (Signed)
Breakfast tray given. °

## 2022-04-21 NOTE — ED Notes (Signed)
Pt sleeping peacefully. Symmetrical chest rise and fall noted. Will continue care

## 2022-04-21 NOTE — ED Notes (Signed)
Lunch tray given. 

## 2022-04-21 NOTE — Progress Notes (Signed)
  [facility] Progress Note    Assessment/ Plan:   Problem list: ESRD on HD MWF via [place] pain-per primary service HTN/Volume-  trying to ease down EDW-  so far so good  Anemia of CKD. Hgb acceptable-11.3 on 12/19 Secondary Hyperparathyroidism. PO4 acceptable 4.7 on 12/19  Plan: -Off schedule for HD this week.   her next OP HD will be on Saturday at noon ( holiday schedule), then Tuesday at noon , then Wed at regular time.  If is still here tomorrow ( Saturday )  will run here -cont to check labs with HD -will UF as tolerated -will cont to follow. Patient is pending placement-  has bed offer but needing insurance auth-  not sure how quickly that will happen.  Will need HD tomorrow here if still here tomorrow    Regular HD orders [facility]- MWF- 3 hours and 45 minutes-  TDC-  400 BFR 3k bath, EDW 83.5 no heparin-  calcitriol 0.5 mcg qtx   [facility]  Pt will not be seen over the weekend-  HD tomorrow, then likely Tuesday if still here-  call with questions   Subjective:   No acute events, pending placement. Has a place but needs insurance auth. Patient seen and examined in ER. Otherwise no complaints.  HD yest - removed 3 liters -  BP did go down with HD -  post weight 76 ( edw was 83)    Objective:   BP (!) 173/97 (BP Location: Left Arm)   Pulse 85   Temp 98.1 F (36.7 C) (Oral)   Resp 18   Ht '5\' 9"'$  (1.753 m)   Wt 76 kg   SpO2 97%   BMI 24.74 kg/m   Intake/Output Summary (Last 24 hours) at 04/21/2022 0856 Last data filed at 04/20/2022 1149 Gross per 24 hour  Intake --  Output 3 ml  Net -3 ml    Weight change:   Physical Exam: Gen:NAD CVS:RRR Resp:normal WOB SPQ:ZRAQ, NT/ND Ext: pitting edema b/l LEs Neuro: awake, alert Dialysis access: RIJ TDC c/d/i  Imaging: No results found.  Labs: BMET Recent Labs  Lab 04/15/22 0752 04/18/22 1315  NA 136 135  K 3.9 4.3  CL 99 98  CO2 29 27   GLUCOSE 293* 251*  BUN 34* 59*  CREATININE 2.21* 3.46*  CALCIUM 8.7* 8.4*  PHOS  --  4.7*   CBC Recent Labs  Lab 04/15/22 0752 04/18/22 1315  WBC 7.7 6.9  NEUTROABS 5.5  --   HGB 13.3 11.3*  HCT 41.9 36.3  MCV 85.3 86.2  PLT 227 226    Medications:     amLODipine  10 mg Oral Daily   apixaban  10 mg Oral BID   Followed by   Derrill Memo ON 04/24/2022] apixaban  5 mg Oral BID   atorvastatin  40 mg Oral QHS   carvedilol  3.125 mg Oral BID WC   Chlorhexidine Gluconate Cloth  6 each Topical Q0600   furosemide  40 mg Oral Daily   hydrALAZINE  100 mg Oral BID   insulin aspart  1-9 Units Subcutaneous TID WC      Chelsea Meza A [facility] 04/21/2022, 8:56 AM

## 2022-04-21 NOTE — Progress Notes (Signed)
Patient has a bed today at [facility], room 133A.  Bedside RN please call report to [phone].  Patient's daughter is at [place] completing all paperwork.  No further TOC needs identified at this time.

## 2022-04-22 DIAGNOSIS — N186 End stage renal disease: Secondary | ICD-10-CM | POA: Diagnosis not present

## 2022-04-22 DIAGNOSIS — Z992 Dependence on renal dialysis: Secondary | ICD-10-CM | POA: Diagnosis not present

## 2022-04-25 DIAGNOSIS — Z992 Dependence on renal dialysis: Secondary | ICD-10-CM | POA: Diagnosis not present

## 2022-04-25 DIAGNOSIS — N186 End stage renal disease: Secondary | ICD-10-CM | POA: Diagnosis not present

## 2022-04-26 DIAGNOSIS — Z992 Dependence on renal dialysis: Secondary | ICD-10-CM | POA: Diagnosis not present

## 2022-04-26 DIAGNOSIS — N186 End stage renal disease: Secondary | ICD-10-CM | POA: Diagnosis not present

## 2022-04-27 DIAGNOSIS — N186 End stage renal disease: Secondary | ICD-10-CM | POA: Diagnosis not present

## 2022-04-27 DIAGNOSIS — I5032 Chronic diastolic (congestive) heart failure: Secondary | ICD-10-CM | POA: Diagnosis not present

## 2022-04-27 DIAGNOSIS — R531 Weakness: Secondary | ICD-10-CM | POA: Diagnosis not present

## 2022-04-27 DIAGNOSIS — D631 Anemia in chronic kidney disease: Secondary | ICD-10-CM | POA: Diagnosis not present

## 2022-04-27 DIAGNOSIS — Z79899 Other long term (current) drug therapy: Secondary | ICD-10-CM | POA: Diagnosis not present

## 2022-04-27 DIAGNOSIS — Z992 Dependence on renal dialysis: Secondary | ICD-10-CM | POA: Diagnosis not present

## 2022-04-28 DIAGNOSIS — N186 End stage renal disease: Secondary | ICD-10-CM | POA: Diagnosis not present

## 2022-04-28 DIAGNOSIS — Z992 Dependence on renal dialysis: Secondary | ICD-10-CM | POA: Diagnosis not present

## 2022-04-30 DIAGNOSIS — N186 End stage renal disease: Secondary | ICD-10-CM | POA: Diagnosis not present

## 2022-04-30 DIAGNOSIS — Z992 Dependence on renal dialysis: Secondary | ICD-10-CM | POA: Diagnosis not present

## 2022-05-01 DIAGNOSIS — N186 End stage renal disease: Secondary | ICD-10-CM | POA: Diagnosis not present

## 2022-05-01 DIAGNOSIS — Z992 Dependence on renal dialysis: Secondary | ICD-10-CM | POA: Diagnosis not present

## 2022-05-02 DIAGNOSIS — I130 Hypertensive heart and chronic kidney disease with heart failure and stage 1 through stage 4 chronic kidney disease, or unspecified chronic kidney disease: Secondary | ICD-10-CM | POA: Diagnosis not present

## 2022-05-02 DIAGNOSIS — K5900 Constipation, unspecified: Secondary | ICD-10-CM | POA: Diagnosis not present

## 2022-05-02 DIAGNOSIS — I4891 Unspecified atrial fibrillation: Secondary | ICD-10-CM | POA: Diagnosis not present

## 2022-05-02 DIAGNOSIS — R531 Weakness: Secondary | ICD-10-CM | POA: Diagnosis not present

## 2022-05-02 DIAGNOSIS — Z79899 Other long term (current) drug therapy: Secondary | ICD-10-CM | POA: Diagnosis not present

## 2022-05-03 DIAGNOSIS — N186 End stage renal disease: Secondary | ICD-10-CM | POA: Diagnosis not present

## 2022-05-03 DIAGNOSIS — Z992 Dependence on renal dialysis: Secondary | ICD-10-CM | POA: Diagnosis not present

## 2022-05-04 DIAGNOSIS — E1122 Type 2 diabetes mellitus with diabetic chronic kidney disease: Secondary | ICD-10-CM | POA: Diagnosis not present

## 2022-05-04 DIAGNOSIS — E079 Disorder of thyroid, unspecified: Secondary | ICD-10-CM | POA: Diagnosis not present

## 2022-05-04 DIAGNOSIS — Z794 Long term (current) use of insulin: Secondary | ICD-10-CM | POA: Diagnosis not present

## 2022-05-04 DIAGNOSIS — E785 Hyperlipidemia, unspecified: Secondary | ICD-10-CM | POA: Diagnosis not present

## 2022-05-04 DIAGNOSIS — I700 Atherosclerosis of aorta: Secondary | ICD-10-CM | POA: Diagnosis not present

## 2022-05-05 DIAGNOSIS — N186 End stage renal disease: Secondary | ICD-10-CM | POA: Diagnosis not present

## 2022-05-05 DIAGNOSIS — Z992 Dependence on renal dialysis: Secondary | ICD-10-CM | POA: Diagnosis not present

## 2022-05-10 DIAGNOSIS — Z992 Dependence on renal dialysis: Secondary | ICD-10-CM | POA: Diagnosis not present

## 2022-05-10 DIAGNOSIS — N186 End stage renal disease: Secondary | ICD-10-CM | POA: Diagnosis not present

## 2022-05-11 DIAGNOSIS — I5032 Chronic diastolic (congestive) heart failure: Secondary | ICD-10-CM | POA: Diagnosis not present

## 2022-05-11 DIAGNOSIS — Z992 Dependence on renal dialysis: Secondary | ICD-10-CM | POA: Diagnosis not present

## 2022-05-11 DIAGNOSIS — R531 Weakness: Secondary | ICD-10-CM | POA: Diagnosis not present

## 2022-05-11 DIAGNOSIS — E785 Hyperlipidemia, unspecified: Secondary | ICD-10-CM | POA: Diagnosis not present

## 2022-05-11 DIAGNOSIS — N186 End stage renal disease: Secondary | ICD-10-CM | POA: Diagnosis not present

## 2022-05-12 DIAGNOSIS — Z992 Dependence on renal dialysis: Secondary | ICD-10-CM | POA: Diagnosis not present

## 2022-05-12 DIAGNOSIS — N186 End stage renal disease: Secondary | ICD-10-CM | POA: Diagnosis not present

## 2022-05-15 DIAGNOSIS — N186 End stage renal disease: Secondary | ICD-10-CM | POA: Diagnosis not present

## 2022-05-15 DIAGNOSIS — Z992 Dependence on renal dialysis: Secondary | ICD-10-CM | POA: Diagnosis not present

## 2022-05-17 DIAGNOSIS — N186 End stage renal disease: Secondary | ICD-10-CM | POA: Diagnosis not present

## 2022-05-17 DIAGNOSIS — Z992 Dependence on renal dialysis: Secondary | ICD-10-CM | POA: Diagnosis not present

## 2022-05-18 DIAGNOSIS — B351 Tinea unguium: Secondary | ICD-10-CM | POA: Diagnosis not present

## 2022-05-18 DIAGNOSIS — M205X2 Other deformities of toe(s) (acquired), left foot: Secondary | ICD-10-CM | POA: Diagnosis not present

## 2022-05-18 DIAGNOSIS — M79675 Pain in left toe(s): Secondary | ICD-10-CM | POA: Diagnosis not present

## 2022-05-18 DIAGNOSIS — M79674 Pain in right toe(s): Secondary | ICD-10-CM | POA: Diagnosis not present

## 2022-05-18 DIAGNOSIS — M205X1 Other deformities of toe(s) (acquired), right foot: Secondary | ICD-10-CM | POA: Diagnosis not present

## 2022-05-19 DIAGNOSIS — N186 End stage renal disease: Secondary | ICD-10-CM | POA: Diagnosis not present

## 2022-05-19 DIAGNOSIS — Z992 Dependence on renal dialysis: Secondary | ICD-10-CM | POA: Diagnosis not present

## 2022-05-22 DIAGNOSIS — N186 End stage renal disease: Secondary | ICD-10-CM | POA: Diagnosis not present

## 2022-05-22 DIAGNOSIS — Z992 Dependence on renal dialysis: Secondary | ICD-10-CM | POA: Diagnosis not present

## 2022-05-24 DIAGNOSIS — Z992 Dependence on renal dialysis: Secondary | ICD-10-CM | POA: Diagnosis not present

## 2022-05-24 DIAGNOSIS — N186 End stage renal disease: Secondary | ICD-10-CM | POA: Diagnosis not present

## 2022-05-26 DIAGNOSIS — N186 End stage renal disease: Secondary | ICD-10-CM | POA: Diagnosis not present

## 2022-05-26 DIAGNOSIS — Z992 Dependence on renal dialysis: Secondary | ICD-10-CM | POA: Diagnosis not present

## 2022-05-29 DIAGNOSIS — Z992 Dependence on renal dialysis: Secondary | ICD-10-CM | POA: Diagnosis not present

## 2022-05-29 DIAGNOSIS — N186 End stage renal disease: Secondary | ICD-10-CM | POA: Diagnosis not present

## 2022-05-31 DIAGNOSIS — N186 End stage renal disease: Secondary | ICD-10-CM | POA: Diagnosis not present

## 2022-05-31 DIAGNOSIS — Z992 Dependence on renal dialysis: Secondary | ICD-10-CM | POA: Diagnosis not present

## 2022-06-02 DIAGNOSIS — Z992 Dependence on renal dialysis: Secondary | ICD-10-CM | POA: Diagnosis not present

## 2022-06-02 DIAGNOSIS — N186 End stage renal disease: Secondary | ICD-10-CM | POA: Diagnosis not present

## 2022-06-07 DIAGNOSIS — N186 End stage renal disease: Secondary | ICD-10-CM | POA: Diagnosis not present

## 2022-06-07 DIAGNOSIS — Z992 Dependence on renal dialysis: Secondary | ICD-10-CM | POA: Diagnosis not present

## 2022-06-09 DIAGNOSIS — Z992 Dependence on renal dialysis: Secondary | ICD-10-CM | POA: Diagnosis not present

## 2022-06-09 DIAGNOSIS — N186 End stage renal disease: Secondary | ICD-10-CM | POA: Diagnosis not present

## 2022-06-12 DIAGNOSIS — N186 End stage renal disease: Secondary | ICD-10-CM | POA: Diagnosis not present

## 2022-06-12 DIAGNOSIS — Z992 Dependence on renal dialysis: Secondary | ICD-10-CM | POA: Diagnosis not present

## 2022-06-14 DIAGNOSIS — N186 End stage renal disease: Secondary | ICD-10-CM | POA: Diagnosis not present

## 2022-06-14 DIAGNOSIS — Z992 Dependence on renal dialysis: Secondary | ICD-10-CM | POA: Diagnosis not present

## 2022-06-16 DIAGNOSIS — Z992 Dependence on renal dialysis: Secondary | ICD-10-CM | POA: Diagnosis not present

## 2022-06-16 DIAGNOSIS — N186 End stage renal disease: Secondary | ICD-10-CM | POA: Diagnosis not present

## 2022-06-19 DIAGNOSIS — Z992 Dependence on renal dialysis: Secondary | ICD-10-CM | POA: Diagnosis not present

## 2022-06-19 DIAGNOSIS — N186 End stage renal disease: Secondary | ICD-10-CM | POA: Diagnosis not present

## 2022-06-21 DIAGNOSIS — Z992 Dependence on renal dialysis: Secondary | ICD-10-CM | POA: Diagnosis not present

## 2022-06-21 DIAGNOSIS — D631 Anemia in chronic kidney disease: Secondary | ICD-10-CM | POA: Diagnosis not present

## 2022-06-21 DIAGNOSIS — I5032 Chronic diastolic (congestive) heart failure: Secondary | ICD-10-CM | POA: Diagnosis not present

## 2022-06-21 DIAGNOSIS — E785 Hyperlipidemia, unspecified: Secondary | ICD-10-CM | POA: Diagnosis not present

## 2022-06-21 DIAGNOSIS — N186 End stage renal disease: Secondary | ICD-10-CM | POA: Diagnosis not present

## 2022-06-23 DIAGNOSIS — N186 End stage renal disease: Secondary | ICD-10-CM | POA: Diagnosis not present

## 2022-06-23 DIAGNOSIS — Z992 Dependence on renal dialysis: Secondary | ICD-10-CM | POA: Diagnosis not present

## 2022-06-26 DIAGNOSIS — N186 End stage renal disease: Secondary | ICD-10-CM | POA: Diagnosis not present

## 2022-06-26 DIAGNOSIS — Z992 Dependence on renal dialysis: Secondary | ICD-10-CM | POA: Diagnosis not present

## 2022-06-28 DIAGNOSIS — Z992 Dependence on renal dialysis: Secondary | ICD-10-CM | POA: Diagnosis not present

## 2022-06-28 DIAGNOSIS — N186 End stage renal disease: Secondary | ICD-10-CM | POA: Diagnosis not present

## 2022-06-29 DIAGNOSIS — Z79899 Other long term (current) drug therapy: Secondary | ICD-10-CM | POA: Diagnosis not present

## 2022-06-29 DIAGNOSIS — Z992 Dependence on renal dialysis: Secondary | ICD-10-CM | POA: Diagnosis not present

## 2022-06-29 DIAGNOSIS — N186 End stage renal disease: Secondary | ICD-10-CM | POA: Diagnosis not present

## 2022-06-30 DIAGNOSIS — N186 End stage renal disease: Secondary | ICD-10-CM | POA: Diagnosis not present

## 2022-06-30 DIAGNOSIS — Z992 Dependence on renal dialysis: Secondary | ICD-10-CM | POA: Diagnosis not present

## 2022-07-03 DIAGNOSIS — Z992 Dependence on renal dialysis: Secondary | ICD-10-CM | POA: Diagnosis not present

## 2022-07-03 DIAGNOSIS — N186 End stage renal disease: Secondary | ICD-10-CM | POA: Diagnosis not present

## 2022-07-05 DIAGNOSIS — Z992 Dependence on renal dialysis: Secondary | ICD-10-CM | POA: Diagnosis not present

## 2022-07-05 DIAGNOSIS — N186 End stage renal disease: Secondary | ICD-10-CM | POA: Diagnosis not present

## 2022-07-06 ENCOUNTER — Encounter: Payer: Self-pay | Admitting: Obstetrics and Gynecology

## 2022-07-06 ENCOUNTER — Ambulatory Visit (INDEPENDENT_AMBULATORY_CARE_PROVIDER_SITE_OTHER): Payer: 59 | Admitting: Obstetrics and Gynecology

## 2022-07-06 VITALS — BP 141/85 | HR 82 | Ht 70.0 in | Wt 178.0 lb

## 2022-07-06 DIAGNOSIS — N950 Postmenopausal bleeding: Secondary | ICD-10-CM | POA: Insufficient documentation

## 2022-07-06 NOTE — Progress Notes (Signed)
Ms Senger presents with her daughter for evaluation of one episode of vaginal bleeding last week. Pt reports she was being transported back to the nursing home after her dialysis. Ride back was very "bumpy". Care givers noted some vaginal bleeding when cleaning pt. Has not had anymore bleeding since.  Daughter thinks she might have had a brief episode of vaginal bleeding in 2016.  CT scan 12/23, noted normal uterus and ovaries. IUD in uterus. Pt does not recall ever having an IUD placed  Mutiple medical problems as listed in EPIC  PE  Pt in wheel chair and requires lift to be moved Remainder of PE deferred  A/P PMB ?        Retained IUD  Discussion with pt and daughter that for further evaluation, EUA would be pt's best option. Discussed that enlight of pt's medical problems and rare episodes of vaginal bleeding, not sure that the risks of EUA would be beneficial .  Pt agrees with this. Will Tx with doxycycline 100 mg x 10 days for possible endometritis. F/U in 2-3 months to rediscuss continuation of conservative management

## 2022-07-10 DIAGNOSIS — N186 End stage renal disease: Secondary | ICD-10-CM | POA: Diagnosis not present

## 2022-07-10 DIAGNOSIS — Z992 Dependence on renal dialysis: Secondary | ICD-10-CM | POA: Diagnosis not present

## 2022-07-11 DIAGNOSIS — Z79899 Other long term (current) drug therapy: Secondary | ICD-10-CM | POA: Diagnosis not present

## 2022-07-12 ENCOUNTER — Ambulatory Visit (INDEPENDENT_AMBULATORY_CARE_PROVIDER_SITE_OTHER): Payer: 59 | Admitting: Vascular Surgery

## 2022-07-12 ENCOUNTER — Encounter: Payer: Self-pay | Admitting: Vascular Surgery

## 2022-07-12 VITALS — BP 144/92 | HR 85 | Temp 97.5°F | Ht 70.0 in | Wt 174.0 lb

## 2022-07-12 DIAGNOSIS — N186 End stage renal disease: Secondary | ICD-10-CM

## 2022-07-12 DIAGNOSIS — D631 Anemia in chronic kidney disease: Secondary | ICD-10-CM | POA: Diagnosis not present

## 2022-07-12 DIAGNOSIS — E785 Hyperlipidemia, unspecified: Secondary | ICD-10-CM | POA: Diagnosis not present

## 2022-07-12 DIAGNOSIS — Z992 Dependence on renal dialysis: Secondary | ICD-10-CM

## 2022-07-12 DIAGNOSIS — I5032 Chronic diastolic (congestive) heart failure: Secondary | ICD-10-CM | POA: Diagnosis not present

## 2022-07-12 NOTE — Progress Notes (Signed)
[facility] [place]  Patient name: Chelsea Meza MRN: [id number] DOB: 07-20-1935 Sex: female  REASON FOR VISIT: Continued discussion of access for hemodialysis  HPI: Chelsea Meza is a 87 y.o. female here today for continued discussion of access for hemodialysis.  I had initial visit with her in November 2023.  At that time she was found to have extremely small cephalic and basilic veins bilaterally.  Suggested a left upper arm AV Gore-Tex graft as her initial dialysis.  She was seen here with her daughter at that visit and her daughter is here again today.  She continues to have dialysis via a right IJ catheter.  It appears that there is occasional flow difficulty but no evidence of infection  Past Medical History:  Diagnosis Date   Anemia    Chronic atrial fibrillation ([name])    CKD (chronic kidney disease) stage 3, GFR 30-59 ml/min ([name])    Diabetes mellitus without complication ([name])    Hyperlipidemia    Hypertension    Localized edema    Other specified disorders of thyroid    TIA (transient ischemic attack)    Unspecified convulsions ([name])     Family History  Problem Relation Age of Onset   Stroke Mother    Stroke Maternal Grandmother     SOCIAL HISTORY: Social History   Tobacco Use   Smoking status: Never   Smokeless tobacco: Never  Substance Use Topics   Alcohol use: No    No Known Allergies  Current Outpatient Medications  Medication Sig Dispense Refill   Accu-Chek FastClix Lancets MISC      ACCU-CHEK GUIDE test strip USE TO TEST TWICE DAILY.AL     acetaminophen (TYLENOL) 325 MG tablet Take 2 tablets (650 mg total) by mouth every 6 (six) hours as needed for mild pain, fever or headache (or Fever >/= 101). 120 tablet 0   amLODipine (NORVASC) 10 MG tablet Take 1 tablet (10 mg total) by mouth daily. 90 tablet 1   apixaban (ELIQUIS) 2.5 MG TABS tablet Take 1 tablet (2.5 mg total) by mouth 2 (two)  times daily. 180 tablet 0   atorvastatin (LIPITOR) 40 MG tablet Take 40 mg by mouth at bedtime.     Blood Glucose Monitoring Suppl (ACCU-CHEK GUIDE) w/Device KIT USE AS DIRECTED.I     carvedilol (COREG) 3.125 MG tablet Take 1 tablet (3.125 mg total) by mouth 2 (two) times daily with a meal. 60 tablet 1   Darbepoetin Alfa (ARANESP) 200 MCG/0.4ML SOSY injection Inject 0.4 mLs (200 mcg total) into the skin every 7 (seven) days. 1.68 mL 3   feeding supplement (ENSURE ENLIVE / ENSURE PLUS) LIQD Take 237 mLs by mouth 2 (two) times daily between meals. 237 mL 12   furosemide (LASIX) 40 MG tablet Take 1 tablet (40 mg total) by mouth daily. 30 tablet 2   GVOKE PFS 0.5 MG/0.1ML SOSY Inject 0.5 mg into the skin as needed (low bs).     hydrALAZINE (APRESOLINE) 100 MG tablet Take 1 tablet (100 mg total) by mouth 3 (three) times daily. (Patient taking differently: Take 100 mg by mouth 2 (two) times daily.) 90 tablet 1   insulin aspart (NOVOLOG) 100 UNIT/ML injection NovoLog insulin per sliding scale glucose 0-120--no units; 121 to 150-1 unit; 151 to 200-2 units; 201 to 250-3 units; 251 to 300-5 units; 30 to 350-7 units; 351 to 400-9 units 10 mL 11   insulin lispro (HUMALOG) 100 UNIT/ML injection Inject 0-9  Units into the skin See admin instructions. Per sliding scale 0-120=0 units;121-150=1 unit;151-200=2 units;201-250= 3 units;251-300= 5 units;301-350=7 units;351-400= 9 units.     Insulin Pen Needle (PEN NEEDLES) 32G X 4 MM MISC Use as directed 100 each 11   iron sucrose 100 mg in sodium chloride 0.9 % 100 mL Inject 100 mg into the vein every Monday, Wednesday, and Friday with hemodialysis. 3 ampule 2   lidocaine (LIDODERM) 5 % Place 1 patch onto the skin daily. Remove & Discard patch within 12 hours or as directed by MD 30 patch 0   senna-docusate (SENOKOT-S) 8.6-50 MG tablet Take 1 tablet by mouth daily. 7 tablet 0   sodium zirconium cyclosilicate (LOKELMA) 10 g PACK packet Take 10 g by mouth every Monday,  Wednesday, and Friday.     No current facility-administered medications for this visit.    REVIEW OF SYSTEMS:  '[X]'$  denotes positive finding, '[ ]'$  denotes negative finding Cardiac  Comments:  Chest pain or chest pressure:    Shortness of breath upon exertion:    Short of breath when lying flat:    Irregular heart rhythm:        Vascular    Pain in calf, thigh, or hip brought on by ambulation:    Pain in feet at night that wakes you up from your sleep:     Blood clot in your veins:    Leg swelling:           PHYSICAL EXAM: Vitals:   07/12/22 1218  BP: (!) 144/92  Pulse: 85  Temp: (!) 97.5 F (36.4 C)  SpO2: 97%  Weight: 174 lb (78.9 kg)  Height: '5\' 10"'$  (1.778 m)    GENERAL: The patient is a well-nourished female, in no acute distress. The vital signs are documented above. CARDIOVASCULAR: 2+ radial pulses bilaterally.  Small surface veins bilaterally PULMONARY: There is good air exchange  MUSCULOSKELETAL: There are no major deformities or cyanosis. NEUROLOGIC: No focal weakness or paresthesias are detected. SKIN: There are no ulcers or rashes noted. PSYCHIATRIC: The patient has a normal affect.  DATA:  None  MEDICAL ISSUES: I did have a long discussion with the patient with my recommendation of left upper arm AV Gore-Tex graft.  The patient's daughter works in a long-term care facility.  The patient is also a resident of a different long-term care facility.  Patient's daughter is concerned with her care situation that if she had any bleeding from her access that this would not be detected in a timely manner.  The daughter has had some experience with patients who have had bleeding from their access.  I did explain that this is certainly possible but not a common complication.  Wish to Continue to consider options and discuss this with other siblings before making a decision.  She understands that we would proceed with surgery at [facility] for left upper arm AV  graft.  She will notify us should she wish to proceed    Rosetta Posner, MD FACS [facility] [place] Office Tel [phone]  Note: Portions of this report may have been transcribed using voice recognition software.  Every effort has been made to ensure accuracy; however, inadvertent computerized transcription errors may still be present.

## 2022-07-14 DIAGNOSIS — N186 End stage renal disease: Secondary | ICD-10-CM | POA: Diagnosis not present

## 2022-07-14 DIAGNOSIS — Z992 Dependence on renal dialysis: Secondary | ICD-10-CM | POA: Diagnosis not present

## 2022-07-17 DIAGNOSIS — N186 End stage renal disease: Secondary | ICD-10-CM | POA: Diagnosis not present

## 2022-07-17 DIAGNOSIS — Z992 Dependence on renal dialysis: Secondary | ICD-10-CM | POA: Diagnosis not present

## 2022-07-19 DIAGNOSIS — N186 End stage renal disease: Secondary | ICD-10-CM | POA: Diagnosis not present

## 2022-07-19 DIAGNOSIS — Z992 Dependence on renal dialysis: Secondary | ICD-10-CM | POA: Diagnosis not present

## 2022-07-21 DIAGNOSIS — N186 End stage renal disease: Secondary | ICD-10-CM | POA: Diagnosis not present

## 2022-07-21 DIAGNOSIS — Z992 Dependence on renal dialysis: Secondary | ICD-10-CM | POA: Diagnosis not present

## 2022-07-24 DIAGNOSIS — N186 End stage renal disease: Secondary | ICD-10-CM | POA: Diagnosis not present

## 2022-07-24 DIAGNOSIS — Z992 Dependence on renal dialysis: Secondary | ICD-10-CM | POA: Diagnosis not present

## 2022-07-26 DIAGNOSIS — N186 End stage renal disease: Secondary | ICD-10-CM | POA: Diagnosis not present

## 2022-07-26 DIAGNOSIS — Z992 Dependence on renal dialysis: Secondary | ICD-10-CM | POA: Diagnosis not present

## 2022-07-28 DIAGNOSIS — Z992 Dependence on renal dialysis: Secondary | ICD-10-CM | POA: Diagnosis not present

## 2022-07-28 DIAGNOSIS — N186 End stage renal disease: Secondary | ICD-10-CM | POA: Diagnosis not present

## 2022-07-30 DIAGNOSIS — N186 End stage renal disease: Secondary | ICD-10-CM | POA: Diagnosis not present

## 2022-07-30 DIAGNOSIS — Z992 Dependence on renal dialysis: Secondary | ICD-10-CM | POA: Diagnosis not present

## 2022-07-31 DIAGNOSIS — N186 End stage renal disease: Secondary | ICD-10-CM | POA: Diagnosis not present

## 2022-07-31 DIAGNOSIS — Z992 Dependence on renal dialysis: Secondary | ICD-10-CM | POA: Diagnosis not present

## 2022-08-02 DIAGNOSIS — N186 End stage renal disease: Secondary | ICD-10-CM | POA: Diagnosis not present

## 2022-08-02 DIAGNOSIS — Z992 Dependence on renal dialysis: Secondary | ICD-10-CM | POA: Diagnosis not present

## 2022-08-07 DIAGNOSIS — N186 End stage renal disease: Secondary | ICD-10-CM | POA: Diagnosis not present

## 2022-08-07 DIAGNOSIS — E119 Type 2 diabetes mellitus without complications: Secondary | ICD-10-CM | POA: Diagnosis not present

## 2022-08-07 DIAGNOSIS — Z992 Dependence on renal dialysis: Secondary | ICD-10-CM | POA: Diagnosis not present

## 2022-08-09 DIAGNOSIS — N186 End stage renal disease: Secondary | ICD-10-CM | POA: Diagnosis not present

## 2022-08-09 DIAGNOSIS — Z992 Dependence on renal dialysis: Secondary | ICD-10-CM | POA: Diagnosis not present

## 2022-08-11 DIAGNOSIS — Z992 Dependence on renal dialysis: Secondary | ICD-10-CM | POA: Diagnosis not present

## 2022-08-11 DIAGNOSIS — N186 End stage renal disease: Secondary | ICD-10-CM | POA: Diagnosis not present

## 2022-08-14 DIAGNOSIS — Z992 Dependence on renal dialysis: Secondary | ICD-10-CM | POA: Diagnosis not present

## 2022-08-14 DIAGNOSIS — N186 End stage renal disease: Secondary | ICD-10-CM | POA: Diagnosis not present

## 2022-08-17 DIAGNOSIS — N186 End stage renal disease: Secondary | ICD-10-CM | POA: Diagnosis not present

## 2022-08-17 DIAGNOSIS — I5032 Chronic diastolic (congestive) heart failure: Secondary | ICD-10-CM | POA: Diagnosis not present

## 2022-08-17 DIAGNOSIS — E1122 Type 2 diabetes mellitus with diabetic chronic kidney disease: Secondary | ICD-10-CM | POA: Diagnosis not present

## 2022-08-17 DIAGNOSIS — Z992 Dependence on renal dialysis: Secondary | ICD-10-CM | POA: Diagnosis not present

## 2022-08-18 DIAGNOSIS — Z992 Dependence on renal dialysis: Secondary | ICD-10-CM | POA: Diagnosis not present

## 2022-08-18 DIAGNOSIS — N186 End stage renal disease: Secondary | ICD-10-CM | POA: Diagnosis not present

## 2022-08-21 DIAGNOSIS — E43 Unspecified severe protein-calorie malnutrition: Secondary | ICD-10-CM | POA: Diagnosis not present

## 2022-08-21 DIAGNOSIS — N186 End stage renal disease: Secondary | ICD-10-CM | POA: Diagnosis not present

## 2022-08-21 DIAGNOSIS — Z992 Dependence on renal dialysis: Secondary | ICD-10-CM | POA: Diagnosis not present

## 2022-08-22 DIAGNOSIS — Z79899 Other long term (current) drug therapy: Secondary | ICD-10-CM | POA: Diagnosis not present

## 2022-08-22 DIAGNOSIS — G4700 Insomnia, unspecified: Secondary | ICD-10-CM | POA: Diagnosis not present

## 2022-08-23 DIAGNOSIS — Z992 Dependence on renal dialysis: Secondary | ICD-10-CM | POA: Diagnosis not present

## 2022-08-23 DIAGNOSIS — E43 Unspecified severe protein-calorie malnutrition: Secondary | ICD-10-CM | POA: Diagnosis not present

## 2022-08-23 DIAGNOSIS — N186 End stage renal disease: Secondary | ICD-10-CM | POA: Diagnosis not present

## 2022-08-25 DIAGNOSIS — E43 Unspecified severe protein-calorie malnutrition: Secondary | ICD-10-CM | POA: Diagnosis not present

## 2022-08-25 DIAGNOSIS — Z992 Dependence on renal dialysis: Secondary | ICD-10-CM | POA: Diagnosis not present

## 2022-08-25 DIAGNOSIS — N186 End stage renal disease: Secondary | ICD-10-CM | POA: Diagnosis not present

## 2022-08-28 DIAGNOSIS — N186 End stage renal disease: Secondary | ICD-10-CM | POA: Diagnosis not present

## 2022-08-28 DIAGNOSIS — E43 Unspecified severe protein-calorie malnutrition: Secondary | ICD-10-CM | POA: Diagnosis not present

## 2022-08-29 DIAGNOSIS — Z992 Dependence on renal dialysis: Secondary | ICD-10-CM | POA: Diagnosis not present

## 2022-08-29 DIAGNOSIS — N186 End stage renal disease: Secondary | ICD-10-CM | POA: Diagnosis not present

## 2022-08-30 DIAGNOSIS — N186 End stage renal disease: Secondary | ICD-10-CM | POA: Diagnosis not present

## 2022-08-30 DIAGNOSIS — Z992 Dependence on renal dialysis: Secondary | ICD-10-CM | POA: Diagnosis not present

## 2022-08-30 DIAGNOSIS — E43 Unspecified severe protein-calorie malnutrition: Secondary | ICD-10-CM | POA: Diagnosis not present

## 2022-08-30 DIAGNOSIS — Z23 Encounter for immunization: Secondary | ICD-10-CM | POA: Diagnosis not present

## 2022-09-01 DIAGNOSIS — Z23 Encounter for immunization: Secondary | ICD-10-CM | POA: Diagnosis not present

## 2022-09-01 DIAGNOSIS — Z992 Dependence on renal dialysis: Secondary | ICD-10-CM | POA: Diagnosis not present

## 2022-09-01 DIAGNOSIS — N186 End stage renal disease: Secondary | ICD-10-CM | POA: Diagnosis not present

## 2022-09-01 DIAGNOSIS — E43 Unspecified severe protein-calorie malnutrition: Secondary | ICD-10-CM | POA: Diagnosis not present

## 2022-09-04 ENCOUNTER — Ambulatory Visit: Payer: Medicare Other | Admitting: Cardiology

## 2022-09-04 DIAGNOSIS — E43 Unspecified severe protein-calorie malnutrition: Secondary | ICD-10-CM | POA: Diagnosis not present

## 2022-09-04 DIAGNOSIS — N186 End stage renal disease: Secondary | ICD-10-CM | POA: Diagnosis not present

## 2022-09-06 ENCOUNTER — Ambulatory Visit: Payer: Self-pay | Admitting: Cardiology

## 2022-09-06 DIAGNOSIS — E43 Unspecified severe protein-calorie malnutrition: Secondary | ICD-10-CM | POA: Diagnosis not present

## 2022-09-06 DIAGNOSIS — N186 End stage renal disease: Secondary | ICD-10-CM | POA: Diagnosis not present

## 2022-09-06 DIAGNOSIS — Z23 Encounter for immunization: Secondary | ICD-10-CM | POA: Diagnosis not present

## 2022-09-06 DIAGNOSIS — Z992 Dependence on renal dialysis: Secondary | ICD-10-CM | POA: Diagnosis not present

## 2022-09-07 DIAGNOSIS — B351 Tinea unguium: Secondary | ICD-10-CM | POA: Diagnosis not present

## 2022-09-08 DIAGNOSIS — E43 Unspecified severe protein-calorie malnutrition: Secondary | ICD-10-CM | POA: Diagnosis not present

## 2022-09-08 DIAGNOSIS — N186 End stage renal disease: Secondary | ICD-10-CM | POA: Diagnosis not present

## 2022-09-08 DIAGNOSIS — Z992 Dependence on renal dialysis: Secondary | ICD-10-CM | POA: Diagnosis not present

## 2022-09-08 DIAGNOSIS — Z23 Encounter for immunization: Secondary | ICD-10-CM | POA: Diagnosis not present

## 2022-09-11 DIAGNOSIS — E43 Unspecified severe protein-calorie malnutrition: Secondary | ICD-10-CM | POA: Diagnosis not present

## 2022-09-11 DIAGNOSIS — Z23 Encounter for immunization: Secondary | ICD-10-CM | POA: Diagnosis not present

## 2022-09-11 DIAGNOSIS — Z992 Dependence on renal dialysis: Secondary | ICD-10-CM | POA: Diagnosis not present

## 2022-09-11 DIAGNOSIS — N186 End stage renal disease: Secondary | ICD-10-CM | POA: Diagnosis not present

## 2022-09-13 DIAGNOSIS — Z23 Encounter for immunization: Secondary | ICD-10-CM | POA: Diagnosis not present

## 2022-09-13 DIAGNOSIS — Z992 Dependence on renal dialysis: Secondary | ICD-10-CM | POA: Diagnosis not present

## 2022-09-13 DIAGNOSIS — N186 End stage renal disease: Secondary | ICD-10-CM | POA: Diagnosis not present

## 2022-09-13 DIAGNOSIS — E43 Unspecified severe protein-calorie malnutrition: Secondary | ICD-10-CM | POA: Diagnosis not present

## 2022-09-15 DIAGNOSIS — E43 Unspecified severe protein-calorie malnutrition: Secondary | ICD-10-CM | POA: Diagnosis not present

## 2022-09-15 DIAGNOSIS — N186 End stage renal disease: Secondary | ICD-10-CM | POA: Diagnosis not present

## 2022-09-15 DIAGNOSIS — Z23 Encounter for immunization: Secondary | ICD-10-CM | POA: Diagnosis not present

## 2022-09-15 DIAGNOSIS — Z992 Dependence on renal dialysis: Secondary | ICD-10-CM | POA: Diagnosis not present

## 2022-09-18 DIAGNOSIS — E43 Unspecified severe protein-calorie malnutrition: Secondary | ICD-10-CM | POA: Diagnosis not present

## 2022-09-18 DIAGNOSIS — N186 End stage renal disease: Secondary | ICD-10-CM | POA: Diagnosis not present

## 2022-09-20 DIAGNOSIS — Z992 Dependence on renal dialysis: Secondary | ICD-10-CM | POA: Diagnosis not present

## 2022-09-20 DIAGNOSIS — N186 End stage renal disease: Secondary | ICD-10-CM | POA: Diagnosis not present

## 2022-09-20 DIAGNOSIS — E43 Unspecified severe protein-calorie malnutrition: Secondary | ICD-10-CM | POA: Diagnosis not present

## 2022-09-20 DIAGNOSIS — Z23 Encounter for immunization: Secondary | ICD-10-CM | POA: Diagnosis not present

## 2022-09-22 DIAGNOSIS — N186 End stage renal disease: Secondary | ICD-10-CM | POA: Diagnosis not present

## 2022-09-22 DIAGNOSIS — E43 Unspecified severe protein-calorie malnutrition: Secondary | ICD-10-CM | POA: Diagnosis not present

## 2022-09-25 DIAGNOSIS — N186 End stage renal disease: Secondary | ICD-10-CM | POA: Diagnosis not present

## 2022-09-25 DIAGNOSIS — Z23 Encounter for immunization: Secondary | ICD-10-CM | POA: Diagnosis not present

## 2022-09-25 DIAGNOSIS — E43 Unspecified severe protein-calorie malnutrition: Secondary | ICD-10-CM | POA: Diagnosis not present

## 2022-09-25 DIAGNOSIS — Z992 Dependence on renal dialysis: Secondary | ICD-10-CM | POA: Diagnosis not present

## 2022-09-27 DIAGNOSIS — N186 End stage renal disease: Secondary | ICD-10-CM | POA: Diagnosis not present

## 2022-09-27 DIAGNOSIS — E43 Unspecified severe protein-calorie malnutrition: Secondary | ICD-10-CM | POA: Diagnosis not present

## 2022-09-27 DIAGNOSIS — Z23 Encounter for immunization: Secondary | ICD-10-CM | POA: Diagnosis not present

## 2022-09-27 DIAGNOSIS — Z992 Dependence on renal dialysis: Secondary | ICD-10-CM | POA: Diagnosis not present

## 2022-09-29 DIAGNOSIS — E43 Unspecified severe protein-calorie malnutrition: Secondary | ICD-10-CM | POA: Diagnosis not present

## 2022-09-29 DIAGNOSIS — Z992 Dependence on renal dialysis: Secondary | ICD-10-CM | POA: Diagnosis not present

## 2022-09-29 DIAGNOSIS — N186 End stage renal disease: Secondary | ICD-10-CM | POA: Diagnosis not present

## 2022-10-01 DIAGNOSIS — R319 Hematuria, unspecified: Secondary | ICD-10-CM | POA: Diagnosis not present

## 2022-10-01 DIAGNOSIS — R300 Dysuria: Secondary | ICD-10-CM | POA: Diagnosis not present

## 2022-10-01 DIAGNOSIS — Z79899 Other long term (current) drug therapy: Secondary | ICD-10-CM | POA: Diagnosis not present

## 2022-10-02 DIAGNOSIS — Z992 Dependence on renal dialysis: Secondary | ICD-10-CM | POA: Diagnosis not present

## 2022-10-02 DIAGNOSIS — E43 Unspecified severe protein-calorie malnutrition: Secondary | ICD-10-CM | POA: Diagnosis not present

## 2022-10-02 DIAGNOSIS — N186 End stage renal disease: Secondary | ICD-10-CM | POA: Diagnosis not present

## 2022-10-04 DIAGNOSIS — E43 Unspecified severe protein-calorie malnutrition: Secondary | ICD-10-CM | POA: Diagnosis not present

## 2022-10-04 DIAGNOSIS — N186 End stage renal disease: Secondary | ICD-10-CM | POA: Diagnosis not present

## 2022-10-04 DIAGNOSIS — Z992 Dependence on renal dialysis: Secondary | ICD-10-CM | POA: Diagnosis not present

## 2022-10-06 DIAGNOSIS — Z992 Dependence on renal dialysis: Secondary | ICD-10-CM | POA: Diagnosis not present

## 2022-10-06 DIAGNOSIS — N186 End stage renal disease: Secondary | ICD-10-CM | POA: Diagnosis not present

## 2022-10-06 DIAGNOSIS — E43 Unspecified severe protein-calorie malnutrition: Secondary | ICD-10-CM | POA: Diagnosis not present

## 2022-10-09 DIAGNOSIS — Z992 Dependence on renal dialysis: Secondary | ICD-10-CM | POA: Diagnosis not present

## 2022-10-09 DIAGNOSIS — N186 End stage renal disease: Secondary | ICD-10-CM | POA: Diagnosis not present

## 2022-10-09 DIAGNOSIS — E43 Unspecified severe protein-calorie malnutrition: Secondary | ICD-10-CM | POA: Diagnosis not present

## 2022-10-10 ENCOUNTER — Ambulatory Visit: Payer: 59 | Admitting: Obstetrics & Gynecology

## 2022-10-11 DIAGNOSIS — I5032 Chronic diastolic (congestive) heart failure: Secondary | ICD-10-CM | POA: Diagnosis not present

## 2022-10-11 DIAGNOSIS — E43 Unspecified severe protein-calorie malnutrition: Secondary | ICD-10-CM | POA: Diagnosis not present

## 2022-10-11 DIAGNOSIS — D631 Anemia in chronic kidney disease: Secondary | ICD-10-CM | POA: Diagnosis not present

## 2022-10-11 DIAGNOSIS — E1122 Type 2 diabetes mellitus with diabetic chronic kidney disease: Secondary | ICD-10-CM | POA: Diagnosis not present

## 2022-10-11 DIAGNOSIS — I4891 Unspecified atrial fibrillation: Secondary | ICD-10-CM | POA: Diagnosis not present

## 2022-10-11 DIAGNOSIS — E785 Hyperlipidemia, unspecified: Secondary | ICD-10-CM | POA: Diagnosis not present

## 2022-10-11 DIAGNOSIS — N186 End stage renal disease: Secondary | ICD-10-CM | POA: Diagnosis not present

## 2022-10-11 DIAGNOSIS — R531 Weakness: Secondary | ICD-10-CM | POA: Diagnosis not present

## 2022-10-11 DIAGNOSIS — Z992 Dependence on renal dialysis: Secondary | ICD-10-CM | POA: Diagnosis not present

## 2022-10-13 DIAGNOSIS — N186 End stage renal disease: Secondary | ICD-10-CM | POA: Diagnosis not present

## 2022-10-13 DIAGNOSIS — E43 Unspecified severe protein-calorie malnutrition: Secondary | ICD-10-CM | POA: Diagnosis not present

## 2022-10-13 DIAGNOSIS — Z992 Dependence on renal dialysis: Secondary | ICD-10-CM | POA: Diagnosis not present

## 2022-10-13 NOTE — Progress Notes (Signed)
Cardiology Office Note:  .   Date:  10/24/2022  ID:  Chelsea Meza, DOB January 29, 1936, MRN [id number] PCP: Sherol Dade, DO (Inactive)  [facility] Providers Cardiologist:  Dina Rich, MD    History of Present Illness: .   Chelsea Meza is a 87 y.o. female  with a hx of DM, HTN, HLD, CKD IV, TIA, PSVT. Was seen by [place] in the hospital 09/2018 with SVT in setting of glucose over 800. She had compliance issues with Verapamil. Echo 10/20/19 LVEF 60-65% mod LV wall thickness, impaired relaxation, smallcircumferential pericardial effusion. Holter monitor ordered by PCP 09/2019 but never done.  Patient admitted 04/04/20 with HTN (229/107)and hyperglycemia with BG>600  treated with IV fluids and IV insulin. Patient ran out of meds for 2 weeks   Admission Na 119 lactic acidosis 2.8.She became nauseated and  had cardiac arrest with ROSC.She became hypotensive with BP 67/44, lost of pulse tansiently treated with atropine 1 mg and epi 1 mg IV then became obtunded and CPR 3-4 min. Monitor showed VT followed by bradycardia with ROSC. She became hypotensive again treated with fluid bolus, levophed and epinephrine. EKG had ant ST elevation that resolved on EKG today.Unclear etiology of her arrest. At this time no tele evidence of VT or VF, this looked to qualify as a  PEA arrest. Post arrest EKG ST changes not consistent with ACS. Echocardiogram shows vigorous LVEF at 65 to 70% with severe LVH and mild diastolic dysfunction, normal RV contraction, trivial pericardial effusion.  Patient discharged 01/08/22 with altered mental status, hypoglycemia, hypothermia, AKI and CHF. She was started on dialysis. Review of records show new onset Afib 09/2021 and started on eliquis.Echo 09/2021 reviewed and shows concerns for amyloid with severe LVH.  I saw her 03/2022 and she was stable.  Patient comes in with an aide for f/u. Does dialysis M/W/F and it wears her out. No chest pain, dyspnea, palpitations,  dizziness or presyncope. Having urinary burning and some blood. Was told it was hemorhoids but she says it's urine. She told someone at facility.   ROS:    Studies Reviewed: .       Echo 09/2021     ECHOCARDIOGRAM REPORT       Patient Name:   Chelsea Meza Date of Exam: 10/27/2021  Medical Rec #:  [id number]         Height:       70.0 in  Accession #:    [id number]        Weight:       120.0 lb  Date of Birth:  Jan 25, 1936         BSA:          1.680 m  Patient Age:    86 years          BP:           196/115 mmHg  Patient Gender: F                 HR:           77 bpm.  [facility]   Procedure: 2D Echo, Cardiac Doppler and Color Doppler   Indications:    Atrial fibrillation    History:        Patient has prior history of Echocardiogram examinations,  most                 recent 04/05/2020. CHF, Previous Myocardial Infarction,  TIA,  Arrythmias:Atrial Fibrillation and Tachycardia; Risk                  Factors:Hypertension, Diabetes and Dyslipidemia.    Sonographer:    Mikki [place]  Referring Phys: [id number] PRATIK D [facility]   IMPRESSIONS     1. Echocardiographic features concerning for infiltrative disease such as  amyloidosis. Left ventricular ejection fraction, by estimation, is 60 to  65%. The left ventricle has normal function. The left ventricle has no  regional wall motion abnormalities.   There is severe left ventricular hypertrophy. Left ventricular diastolic  function could not be evaluated.   2. Right ventricular systolic function is normal. The right ventricular  size is normal. Moderately increased right ventricular wall thickness.  There is mildly elevated pulmonary artery systolic pressure. The estimated  right ventricular systolic pressure   is 37.2 mmHg.   3. Left atrial size was moderately dilated.   4. A small pericardial effusion is present. The pericardial effusion is  localized near the right atrium. There is no  evidence of cardiac  tamponade.   5. The mitral valve is abnormal. Trivial mitral valve regurgitation.   6. The aortic valve is tricuspid. Aortic valve regurgitation is trivial.  Aortic valve sclerosis is present, with no evidence of aortic valve  stenosis.   7. Aortic dilatation noted. There is mild dilatation of the aortic root,  measuring 40 mm.   8. The inferior vena cava is normal in size with <50% respiratory  variability, suggesting right atrial pressure of 8 mmHg.   Comparison(s): Changes from prior study are noted. 04/05/2020: LVEF 65-70%,  severe LVH, pericardial effusion.   Conclusion(s)/Recommendation(s): Would consider evaluation for possible  cardiac amyloidosis with PYP scan and/or cMRI.     Risk Assessment/Calculations:    CHA2DS2-VASc Score = 8   This indicates a 10.8% annual risk of stroke. The patient's score is based upon: CHF History: 1 HTN History: 1 Diabetes History: 1 Stroke History: 2 Vascular Disease History: 0 Age Score: 2 Gender Score: 1            Physical Exam:   VS:  BP 136/78   Pulse 81   Ht 5\' 9"  (1.753 m)   Wt 190 lb (86.2 kg)   SpO2 97%   BMI 28.06 kg/m    Wt Readings from Last 3 Encounters:  10/24/22 190 lb (86.2 kg)  07/12/22 174 lb (78.9 kg)  07/06/22 178 lb (80.7 kg)    GEN: Well nourished, well developed in no acute distress NECK: No JVD; No carotid bruits CARDIAC:  RRR, no murmurs, rubs, gallops RESPIRATORY:  Clear to auscultation without rales, wheezing or rhonchi  ABDOMEN: Soft, non-tender, non-distended EXTREMITIES:  plus 1 edema; No deformity   ASSESSMENT AND PLAN: .    Atrial fibrillation, persistent since 09/2021 on eliquis 2.5 mg bid based on age, renal, rate controlled on low dose coreg 3.125 mg bid. Complains of urinary burning and hematuria. Only urinates twice a day. To have facility MD w/u for possible UTI. Otherwise no bleeding on eliquis. Continue current meds. Check labs today.   HTN with severe LVH and  concern for amyloid on echo 09/2021 which was new. No recent syncope. Given age, recent dialysis, palliative care will hold off on MRI for further workup at this time. BP well controlled on hydralazine, amlodipine, coreg, lasix   HLD on atorvastatin managed by PCP   ESRD ON HD   History of TIA on eliquis  Dispo: f/u in 6 months  Signed, Jacolyn Reedy, PA-C

## 2022-10-16 DIAGNOSIS — Z992 Dependence on renal dialysis: Secondary | ICD-10-CM | POA: Diagnosis not present

## 2022-10-16 DIAGNOSIS — N186 End stage renal disease: Secondary | ICD-10-CM | POA: Diagnosis not present

## 2022-10-16 DIAGNOSIS — E43 Unspecified severe protein-calorie malnutrition: Secondary | ICD-10-CM | POA: Diagnosis not present

## 2022-10-18 DIAGNOSIS — Z992 Dependence on renal dialysis: Secondary | ICD-10-CM | POA: Diagnosis not present

## 2022-10-18 DIAGNOSIS — N186 End stage renal disease: Secondary | ICD-10-CM | POA: Diagnosis not present

## 2022-10-18 DIAGNOSIS — E43 Unspecified severe protein-calorie malnutrition: Secondary | ICD-10-CM | POA: Diagnosis not present

## 2022-10-20 DIAGNOSIS — N186 End stage renal disease: Secondary | ICD-10-CM | POA: Diagnosis not present

## 2022-10-20 DIAGNOSIS — E43 Unspecified severe protein-calorie malnutrition: Secondary | ICD-10-CM | POA: Diagnosis not present

## 2022-10-23 DIAGNOSIS — Z992 Dependence on renal dialysis: Secondary | ICD-10-CM | POA: Diagnosis not present

## 2022-10-23 DIAGNOSIS — N186 End stage renal disease: Secondary | ICD-10-CM | POA: Diagnosis not present

## 2022-10-23 DIAGNOSIS — E43 Unspecified severe protein-calorie malnutrition: Secondary | ICD-10-CM | POA: Diagnosis not present

## 2022-10-24 ENCOUNTER — Other Ambulatory Visit (HOSPITAL_COMMUNITY)
Admission: RE | Admit: 2022-10-24 | Discharge: 2022-10-24 | Disposition: A | Discharge status: Home / Self Care | Payer: 59 | Source: Ambulatory Visit | Attending: Physician Assistant | Admitting: Physician Assistant

## 2022-10-24 ENCOUNTER — Encounter: Payer: Self-pay | Admitting: Physician Assistant

## 2022-10-24 ENCOUNTER — Ambulatory Visit: Payer: 59 | Attending: Physician Assistant | Admitting: Physician Assistant

## 2022-10-24 VITALS — BP 136/78 | HR 81 | Ht 69.0 in | Wt 190.0 lb

## 2022-10-24 DIAGNOSIS — I10 Essential (primary) hypertension: Secondary | ICD-10-CM

## 2022-10-24 DIAGNOSIS — N186 End stage renal disease: Secondary | ICD-10-CM | POA: Diagnosis not present

## 2022-10-24 DIAGNOSIS — Z8673 Personal history of transient ischemic attack (TIA), and cerebral infarction without residual deficits: Secondary | ICD-10-CM | POA: Diagnosis not present

## 2022-10-24 DIAGNOSIS — I4819 Other persistent atrial fibrillation: Secondary | ICD-10-CM | POA: Insufficient documentation

## 2022-10-24 DIAGNOSIS — Z992 Dependence on renal dialysis: Secondary | ICD-10-CM

## 2022-10-24 DIAGNOSIS — E7849 Other hyperlipidemia: Secondary | ICD-10-CM | POA: Diagnosis not present

## 2022-10-24 LAB — COMPREHENSIVE METABOLIC PANEL
ALT: 15 U/L (ref 0–44)
AST: 17 U/L (ref 15–41)
Albumin: 3.3 g/dL — ABNORMAL LOW (ref 3.5–5.0)
Alkaline Phosphatase: 59 U/L (ref 38–126)
Anion gap: 13 (ref 5–15)
BUN: 90 mg/dL — ABNORMAL HIGH (ref 8–23)
CO2: 24 mmol/L (ref 22–32)
Calcium: 9 mg/dL (ref 8.9–10.3)
Chloride: 94 mmol/L — ABNORMAL LOW (ref 98–111)
Creatinine, Ser: 5.24 mg/dL — ABNORMAL HIGH (ref 0.44–1.00)
GFR, Estimated: 8 mL/min — ABNORMAL LOW (ref >60)
Glucose, Bld: 234 mg/dL — ABNORMAL HIGH (ref 70–99)
Potassium: 5.1 mmol/L (ref 3.5–5.1)
Sodium: 131 mmol/L — ABNORMAL LOW (ref 135–145)
Total Bilirubin: 0.6 mg/dL (ref 0.3–1.2)
Total Protein: 7.1 g/dL (ref 6.5–8.1)

## 2022-10-24 LAB — CBC
HCT: 42 % (ref 36.0–46.0)
Hemoglobin: 13.2 g/dL (ref 12.0–15.0)
MCH: 29.1 pg (ref 26.0–34.0)
MCHC: 31.4 g/dL (ref 30.0–36.0)
MCV: 92.5 fL (ref 80.0–100.0)
Platelets: 233 10*3/uL (ref 150–400)
RBC: 4.54 MIL/uL (ref 3.87–5.11)
RDW: 15.3 % (ref 11.5–15.5)
WBC: 4.9 10*3/uL (ref 4.0–10.5)
nRBC: 0 % (ref 0.0–0.2)

## 2022-10-24 LAB — TSH: TSH: 4.116 u[IU]/mL (ref 0.350–4.500)

## 2022-10-24 NOTE — Patient Instructions (Signed)
Medication Instructions:  Your physician recommends that you continue on your current medications as directed. Please refer to the Current Medication list given to you today.'  *If you need a refill on your cardiac medications before your next appointment, please call your pharmacy*   Lab Work: CMET CBC TSH  If you have labs (blood work) drawn today and your tests are completely normal, you will receive your results only by: MyChart Message (if you have MyChart) OR A paper copy in the mail If you have any lab test that is abnormal or we need to change your treatment, we will call you to review the results.   Testing/Procedures: None   Follow-Up: At [facility], you and your health needs are our priority.  As part of our continuing mission to provide you with exceptional heart care, we have created designated Provider Care Teams.  These Care Teams include your primary Cardiologist (physician) and Advanced Practice Providers (APPs -  Physician Assistants and Nurse Practitioners) who all work together to provide you with the care you need, when you need it.  We recommend signing up for the patient portal called "MyChart".  Sign up information is provided on this After Visit Summary.  MyChart is used to connect with patients for Virtual Visits (Telemedicine).  Patients are able to view lab/test results, encounter notes, upcoming appointments, etc.  Non-urgent messages can be sent to your provider as well.   To learn more about what you can do with MyChart, go to [email or web address].    Your next appointment:   6 month(s)  Provider:   You may see Dina Rich, MD and Dr. Ladona Ridgel  Other Instructions

## 2022-10-25 DIAGNOSIS — N390 Urinary tract infection, site not specified: Secondary | ICD-10-CM | POA: Diagnosis not present

## 2022-10-25 DIAGNOSIS — E43 Unspecified severe protein-calorie malnutrition: Secondary | ICD-10-CM | POA: Diagnosis not present

## 2022-10-25 DIAGNOSIS — N186 End stage renal disease: Secondary | ICD-10-CM | POA: Diagnosis not present

## 2022-10-25 DIAGNOSIS — Z992 Dependence on renal dialysis: Secondary | ICD-10-CM | POA: Diagnosis not present

## 2022-10-27 DIAGNOSIS — N186 End stage renal disease: Secondary | ICD-10-CM | POA: Diagnosis not present

## 2022-10-27 DIAGNOSIS — E43 Unspecified severe protein-calorie malnutrition: Secondary | ICD-10-CM | POA: Diagnosis not present

## 2022-10-27 DIAGNOSIS — Z992 Dependence on renal dialysis: Secondary | ICD-10-CM | POA: Diagnosis not present

## 2022-10-29 DIAGNOSIS — Z992 Dependence on renal dialysis: Secondary | ICD-10-CM | POA: Diagnosis not present

## 2022-10-29 DIAGNOSIS — N186 End stage renal disease: Secondary | ICD-10-CM | POA: Diagnosis not present

## 2022-10-30 DIAGNOSIS — E43 Unspecified severe protein-calorie malnutrition: Secondary | ICD-10-CM | POA: Diagnosis not present

## 2022-10-30 DIAGNOSIS — N186 End stage renal disease: Secondary | ICD-10-CM | POA: Diagnosis not present

## 2022-10-31 ENCOUNTER — Ambulatory Visit: Payer: 59 | Admitting: Obstetrics & Gynecology

## 2022-10-31 DIAGNOSIS — N390 Urinary tract infection, site not specified: Secondary | ICD-10-CM | POA: Diagnosis not present

## 2022-10-31 DIAGNOSIS — E1142 Type 2 diabetes mellitus with diabetic polyneuropathy: Secondary | ICD-10-CM | POA: Diagnosis not present

## 2022-10-31 DIAGNOSIS — Z79899 Other long term (current) drug therapy: Secondary | ICD-10-CM | POA: Diagnosis not present

## 2022-11-01 DIAGNOSIS — E43 Unspecified severe protein-calorie malnutrition: Secondary | ICD-10-CM | POA: Diagnosis not present

## 2022-11-01 DIAGNOSIS — N186 End stage renal disease: Secondary | ICD-10-CM | POA: Diagnosis not present

## 2022-11-03 DIAGNOSIS — E43 Unspecified severe protein-calorie malnutrition: Secondary | ICD-10-CM | POA: Diagnosis not present

## 2022-11-03 DIAGNOSIS — Z992 Dependence on renal dialysis: Secondary | ICD-10-CM | POA: Diagnosis not present

## 2022-11-03 DIAGNOSIS — N186 End stage renal disease: Secondary | ICD-10-CM | POA: Diagnosis not present

## 2022-11-06 ENCOUNTER — Other Ambulatory Visit: Payer: Self-pay

## 2022-11-06 ENCOUNTER — Telehealth: Payer: Self-pay

## 2022-11-06 DIAGNOSIS — N186 End stage renal disease: Secondary | ICD-10-CM | POA: Diagnosis not present

## 2022-11-06 DIAGNOSIS — T829XXA Unspecified complication of cardiac and vascular prosthetic device, implant and graft, initial encounter: Secondary | ICD-10-CM

## 2022-11-06 DIAGNOSIS — E119 Type 2 diabetes mellitus without complications: Secondary | ICD-10-CM | POA: Diagnosis not present

## 2022-11-06 DIAGNOSIS — Z992 Dependence on renal dialysis: Secondary | ICD-10-CM | POA: Diagnosis not present

## 2022-11-06 DIAGNOSIS — E43 Unspecified severe protein-calorie malnutrition: Secondary | ICD-10-CM | POA: Diagnosis not present

## 2022-11-06 NOTE — Telephone Encounter (Signed)
Received a call from [place] at [facility]. Reports sending a fax request per Royann Shivers, NP for a CVC exchange this week if possible due to two unsuccessful cathflo attempts, unable to achieve BFR and arterial pressure greater than 300. Patient scheduled for 7/11 with Dr. Karin Lieu. Instructions provided, including to hold Eliquis 2 days prior. Voiced understanding.   Spoke with Lelon Mast at [facility]- appointment information given and instructions provided. She voiced understanding.

## 2022-11-08 ENCOUNTER — Encounter (HOSPITAL_COMMUNITY): Payer: Self-pay | Admitting: Vascular Surgery

## 2022-11-08 DIAGNOSIS — N186 End stage renal disease: Secondary | ICD-10-CM | POA: Diagnosis not present

## 2022-11-08 DIAGNOSIS — Z992 Dependence on renal dialysis: Secondary | ICD-10-CM | POA: Diagnosis not present

## 2022-11-08 DIAGNOSIS — E43 Unspecified severe protein-calorie malnutrition: Secondary | ICD-10-CM | POA: Diagnosis not present

## 2022-11-08 NOTE — Anesthesia Preprocedure Evaluation (Signed)
Anesthesia Evaluation  Patient identified by MRN, date of birth, ID band Patient awake    Reviewed: Allergy & Precautions, NPO status , Patient's Chart, lab work & pertinent test results, Unable to perform ROS - Chart review only  Airway Mallampati: III  TM Distance: >3 FB Neck ROM: Full    Dental  (+) Dental Advisory Given, Missing   Pulmonary neg pulmonary ROS   Pulmonary exam normal breath sounds clear to auscultation       Cardiovascular Exercise Tolerance: Poor hypertension, Pt. on medications and Pt. on home beta blockers +CHF   Rhythm:Regular Rate:Normal + Systolic murmurs 1. Echocardiographic features concerning for infiltrative disease such as  amyloidosis. Left ventricular ejection fraction, by estimation, is 60 to  65%. The left ventricle has normal function. The left ventricle has no  regional wall motion abnormalities.  There is severe left ventricular hypertrophy. Left ventricular diastolic  function could not be evaluated.  2. Right ventricular systolic function is normal. The right ventricular  size is normal. Moderately increased right ventricular wall thickness.  There is mildly elevated pulmonary artery systolic pressure. The estimated  right ventricular systolic pressure  is 37.2 mmHg.  3. Left atrial size was moderately dilated.  4. A small pericardial effusion is present. The pericardial effusion is  localized near the right atrium. There is no evidence of cardiac  tamponade.  5. The mitral valve is abnormal. Trivial mitral valve regurgitation.  6. The aortic valve is tricuspid. Aortic valve regurgitation is trivial.  Aortic valve sclerosis is present, with no evidence of aortic valve  stenosis.  7. Aortic dilatation noted. There is mild dilatation of the aortic root,  measuring 40 mm.  8. The inferior vena cava is normal in size with <50% respiratory  variability, suggesting right atrial  pressure of 8 mmHg.    Neuro/Psych Seizures -, Well Controlled,  TIA negative psych ROS   GI/Hepatic negative GI ROS, Neg liver ROS,,,  Endo/Other  diabetes, Well Controlled, Type 2, Insulin Dependent, Oral Hypoglycemic Agents    Renal/GU ESRFRenal disease     Musculoskeletal negative musculoskeletal ROS (+)    Abdominal   Peds  Hematology  (+) Blood dyscrasia, anemia   Anesthesia Other Findings   Reproductive/Obstetrics negative OB ROS                             Anesthesia Physical Anesthesia Plan  ASA: 4  Anesthesia Plan: MAC   Post-op Pain Management: Tylenol PO (pre-op)*   Induction:   PONV Risk Score and Plan: 2 and Treatment may vary due to age or medical condition, Propofol infusion and Ondansetron  Airway Management Planned: Natural Airway and Simple Face Mask  Additional Equipment:   Intra-op Plan:   Post-operative Plan:   Informed Consent: I have reviewed the patients History and Physical, chart, labs and discussed the procedure including the risks, benefits and alternatives for the proposed anesthesia with the patient or authorized representative who has indicated his/her understanding and acceptance.     Dental advisory given  Plan Discussed with: CRNA  Anesthesia Plan Comments: (PAT note by Antionette Poles, PA-C: 87 year old female with pertinent history including HTN, IDDM 2, chronic A-fib on Eliquis, ESRD on HD Monday Wednesday Friday, TIA.  Last seen by cardiology APP Herma Carson PA-C on 10/24/2022.  Stable at that time.  Per note, "HTN with severe LVH and concern for amyloid on echo 09/2021 which was new. No recent syncope. Given  age, recent dialysis, palliative care will hold off on MRI for further workup at this time. BP well controlled on hydralazine, amlodipine, coreg, lasix."   Per vascular surgery, patient to hold Eliquis 2 days prior.  Blood work 10/24/2022 reviewed, creatinine elevated to 5.24 consistent with  history of ESRD, mild hyponatremia sodium 131.  CBC was WNL.  Will need DOS labs and eval.  EKG 04/15/2022: Sinus tachycardia. Rate 109. LVH with secondary repolarization abnormality. Inferior infarct, old. Anterior infarct, old  TTE 10/27/2021: 1. Echocardiographic features concerning for infiltrative disease such as  amyloidosis. Left ventricular ejection fraction, by estimation, is 60 to  65%. The left ventricle has normal function. The left ventricle has no  regional wall motion abnormalities.  There is severe left ventricular hypertrophy. Left ventricular diastolic  function could not be evaluated.  2. Right ventricular systolic function is normal. The right ventricular  size is normal. Moderately increased right ventricular wall thickness.  There is mildly elevated pulmonary artery systolic pressure. The estimated  right ventricular systolic pressure  is 37.2 mmHg.  3. Left atrial size was moderately dilated.  4. A small pericardial effusion is present. The pericardial effusion is  localized near the right atrium. There is no evidence of cardiac  tamponade.  5. The mitral valve is abnormal. Trivial mitral valve regurgitation.  6. The aortic valve is tricuspid. Aortic valve regurgitation is trivial.  Aortic valve sclerosis is present, with no evidence of aortic valve  stenosis.  7. Aortic dilatation noted. There is mild dilatation of the aortic root,  measuring 40 mm.  8. The inferior vena cava is normal in size with <50% respiratory  variability, suggesting right atrial pressure of 8 mmHg.   Comparison(s): Changes from prior study are noted. 04/05/2020: LVEF 65-70%,  severe LVH, pericardial effusion.     )        Anesthesia Quick Evaluation

## 2022-11-08 NOTE — Progress Notes (Signed)
Anesthesia Chart Review: Same day workup  87 year old female with pertinent history including HTN, IDDM 2, chronic A-fib on Eliquis, ESRD on HD Monday Wednesday Friday, TIA.  Last seen by cardiology APP Herma Carson PA-C on 10/24/2022.  Stable at that time.  Per note, "HTN with severe LVH and concern for amyloid on echo 09/2021 which was new. No recent syncope. Given age, recent dialysis, palliative care will hold off on MRI for further workup at this time. BP well controlled on hydralazine, amlodipine, coreg, lasix."   Per vascular surgery, patient to hold Eliquis 2 days prior.  Blood work 10/24/2022 reviewed, creatinine elevated to 5.24 consistent with history of ESRD, mild hyponatremia sodium 131.  CBC was WNL.  Will need DOS labs and eval.  EKG 04/15/2022: Sinus tachycardia. Rate 109. LVH with secondary repolarization abnormality. Inferior infarct, old. Anterior infarct, old  TTE 10/27/2021:  1. Echocardiographic features concerning for infiltrative disease such as  amyloidosis. Left ventricular ejection fraction, by estimation, is 60 to  65%. The left ventricle has normal function. The left ventricle has no  regional wall motion abnormalities.   There is severe left ventricular hypertrophy. Left ventricular diastolic  function could not be evaluated.   2. Right ventricular systolic function is normal. The right ventricular  size is normal. Moderately increased right ventricular wall thickness.  There is mildly elevated pulmonary artery systolic pressure. The estimated  right ventricular systolic pressure   is 37.2 mmHg.   3. Left atrial size was moderately dilated.   4. A small pericardial effusion is present. The pericardial effusion is  localized near the right atrium. There is no evidence of cardiac  tamponade.   5. The mitral valve is abnormal. Trivial mitral valve regurgitation.   6. The aortic valve is tricuspid. Aortic valve regurgitation is trivial.  Aortic valve sclerosis  is present, with no evidence of aortic valve  stenosis.   7. Aortic dilatation noted. There is mild dilatation of the aortic root,  measuring 40 mm.   8. The inferior vena cava is normal in size with <50% respiratory  variability, suggesting right atrial pressure of 8 mmHg.   Comparison(s): Changes from prior study are noted. 04/05/2020: LVEF 65-70%,  severe LVH, pericardial effusion.     Zannie Cove [facility] Stay [facility]/Anesthesiology Phone [phone] 11/08/2022 9:41 AM

## 2022-11-08 NOTE — Progress Notes (Signed)
Surgical Instructions    Your procedure is scheduled on Thursday November 09, 2022.  Report to [facility] Main Entrance "A" at 10:00 A.M., then check in with the Admitting office.  Call this number if you have problems the morning of surgery:  [phone]   If you have any questions prior to your surgery date call [phone]: Open Monday-Friday 8am-4pm If you experience any cold or flu symptoms such as cough, fever, chills, shortness of breath, etc. between now and your scheduled surgery, please notify us at the above number    Remember:  Do not eat or drink after midnight the night before your surgery    Take these medicines the morning of surgery with A SIP OF WATER:  amLODipine (NORVASC)  carvedilol (COREG)  hydrALAZINE (APRESOLINE)    As needed: acetaminophen (TYLENOL)    As of today, STOP taking any Aspirin (unless otherwise instructed by your surgeon) Aleve, Naproxen, Ibuprofen, Motrin, Advil, Goody's, BC's, all herbal medications, fish oil, and all vitamins.   WHAT DO I DO ABOUT MY DIABETES MEDICATION?   Do not take oral diabetes medicines (pills) the morning of surgery.      THE MORNING OF SURGERY, take zero units of insulin lispro (HUMALOG).   The day of surgery, do not take other diabetes injectables, including Byetta (exenatide), Bydureon (exenatide ER), Victoza (liraglutide), or Trulicity (dulaglutide).  If your CBG is greater than 220 mg/dL, you may take  of your sliding scale (correction) dose of insulin.   HOW TO MANAGE YOUR DIABETES BEFORE AND AFTER SURGERY  Why is it important to control my blood sugar before and after surgery? Improving blood sugar levels before and after surgery helps healing and can limit problems. A way of improving blood sugar control is eating a healthy diet by:  Eating less sugar and carbohydrates  Increasing activity/exercise  Talking with your doctor about reaching your blood sugar goals High blood sugars (greater than 180  mg/dL) can raise your risk of infections and slow your recovery, so you will need to focus on controlling your diabetes during the weeks before surgery. Make sure that the doctor who takes care of your diabetes knows about your planned surgery including the date and location.  How do I manage my blood sugar before surgery? Check your blood sugar at least 4 times a day, starting 2 days before surgery, to make sure that the level is not too high or low.  Check your blood sugar the morning of your surgery when you wake up and every 2 hours until you get to the Short Stay unit.  If your blood sugar is less than 70 mg/dL, you will need to treat for low blood sugar: Do not take insulin. Treat a low blood sugar (less than 70 mg/dL) with  cup of clear juice (cranberry or apple), 4 glucose tablets, OR glucose gel. Recheck blood sugar in 15 minutes after treatment (to make sure it is greater than 70 mg/dL). If your blood sugar is not greater than 70 mg/dL on recheck, call [phone] for further instructions. Report your blood sugar to the short stay nurse when you get to Short Stay.  If you are admitted to the hospital after surgery: Your blood sugar will be checked by the staff and you will probably be given insulin after surgery (instead of oral diabetes medicines) to make sure you have good blood sugar levels. The goal for blood sugar control after surgery is 80-180 mg/dL.      Do NOT  Smoke (Tobacco/Vaping)  24 hours prior to your procedure  If you use a CPAP at night, you may bring your mask for your overnight stay.   Contacts, glasses, hearing aids, dentures or partials may not be worn into surgery, please bring cases for these belongings   For patients admitted to the hospital, discharge time will be determined by your treatment team.   Patients discharged the day of surgery will not be allowed to drive home, and someone needs to stay with them for 24 hours.   SURGICAL WAITING ROOM  VISITATION Patients having surgery or a procedure may have no more than 2 support people in the waiting area - these visitors may rotate.   Children under the age of 89 must have an adult with them who is not the patient. If the patient needs to stay at the hospital during part of their recovery, the visitor guidelines for inpatient rooms apply. Pre-op nurse will coordinate an appropriate time for 1 support person to accompany patient in pre-op.  This support person may not rotate.   Please refer to [email or web address] for the visitor guidelines for Inpatients (after your surgery is over and you are in a regular room).    Special instructions:    Oral Hygiene is also important to reduce your risk of infection.  Remember - BRUSH YOUR TEETH THE MORNING OF SURGERY WITH YOUR REGULAR TOOTHPASTE   [facility]- Preparing For Surgery  Before surgery, you can play an important role. Because skin is not sterile, your skin needs to be as free of germs as possible. You can reduce the number of germs on your skin by washing with Dial Soap before surgery.     Please follow these instructions carefully.     Shower the NIGHT BEFORE SURGERY and the MORNING OF SURGERY with Dial Soap.   Pat yourself dry with a CLEAN TOWEL. Do not apply lotions, powders or deodorant.   Wear CLEAN PAJAMAS to bed the night before surgery  Place CLEAN SHEETS on your bed the night before your surgery   Day of Surgery:  Take a shower with Dial soap. Do not wear jewelry or makeup. Do not wear lotions, powders, perfumes deodorant. Do not shave 48 hours prior to surgery.  Do not bring valuables to the hospital. Do not wear nail polish, gel polish, artificial nails, or any other type of covering on natural nails (fingers and toes) If you have artificial nails or gel coating that need to be removed by a nail salon, please have this removed prior to surgery. Artificial nails  or gel coating may interfere with anesthesia's ability to adequately monitor your vital signs.  [facility] is not responsible for any belongings or valuables.    Wear Clean/Comfortable clothing the morning of surgery    Remember to brush your teeth WITH YOUR REGULAR TOOTHPASTE.    If you received a COVID test during your pre-op visit, it is requested that you wear a mask when out in public, stay away from anyone that may not be feeling well, and notify your surgeon if you develop symptoms. If you have been in contact with anyone that has tested positive in the last 10 days, please notify your surgeon.    Please read over the following fact sheets that you were given.

## 2022-11-08 NOTE — Progress Notes (Signed)
SDW call  Patient is a resident and [facility] and [address].  Spoke to [place] at [phone].  Pre-surgical instructions were faxed to [phone].     PCP - Dr. Sherol Dade Cardiologist - Dr. Dina Rich Pulmonary:    PPM/ICD - denies  Chest x-ray - 01/05/2022 EKG -  04/17/2022 Stress Test - ECHO - 10/27/2021 Cardiac Cath -   Sleep Study/sleep apnea/CPAP: denies  Type II diabetic Fasting Blood sugar range: 157 today How often check sugars: Three times a day Insulin lispro (humalog), instructed zero units day of surgery.  If blood sugar is > 220, use 1/2 correction dose   Blood Thinner Instructions: Eliquis, states last dose 11/06/2022 Aspirin Instructions:denies   ERAS Protcol - No, NPO PRE-SURGERY Ensure or G2-    COVID TEST- n/a    Anesthesia review: Yes. HTN, DM, CKD, TIA, A-fib, convulsions

## 2022-11-09 ENCOUNTER — Ambulatory Visit (HOSPITAL_COMMUNITY)
Admission: RE | Admit: 2022-11-09 | Discharge: 2022-11-09 | Disposition: A | Discharge status: Skilled Nursing Facility | Payer: 59 | Attending: Vascular Surgery | Admitting: Vascular Surgery

## 2022-11-09 ENCOUNTER — Ambulatory Visit (HOSPITAL_BASED_OUTPATIENT_CLINIC_OR_DEPARTMENT_OTHER): Payer: 59 | Admitting: Physician Assistant

## 2022-11-09 ENCOUNTER — Ambulatory Visit (HOSPITAL_COMMUNITY): Payer: 59

## 2022-11-09 ENCOUNTER — Encounter (HOSPITAL_COMMUNITY): Payer: Self-pay | Admitting: Vascular Surgery

## 2022-11-09 ENCOUNTER — Ambulatory Visit (HOSPITAL_COMMUNITY): Payer: 59 | Admitting: Physician Assistant

## 2022-11-09 ENCOUNTER — Other Ambulatory Visit: Payer: Self-pay

## 2022-11-09 ENCOUNTER — Encounter (HOSPITAL_COMMUNITY)
Admission: RE | Disposition: A | Discharge status: Skilled Nursing Facility | Payer: Self-pay | Source: Home / Self Care | Attending: Vascular Surgery

## 2022-11-09 DIAGNOSIS — R569 Unspecified convulsions: Secondary | ICD-10-CM | POA: Diagnosis not present

## 2022-11-09 DIAGNOSIS — T8241XA Breakdown (mechanical) of vascular dialysis catheter, initial encounter: Secondary | ICD-10-CM | POA: Diagnosis not present

## 2022-11-09 DIAGNOSIS — T829XXA Unspecified complication of cardiac and vascular prosthetic device, implant and graft, initial encounter: Secondary | ICD-10-CM | POA: Diagnosis not present

## 2022-11-09 DIAGNOSIS — T82898A Other specified complication of vascular prosthetic devices, implants and grafts, initial encounter: Secondary | ICD-10-CM | POA: Diagnosis not present

## 2022-11-09 DIAGNOSIS — Z992 Dependence on renal dialysis: Secondary | ICD-10-CM | POA: Insufficient documentation

## 2022-11-09 DIAGNOSIS — Y828 Other medical devices associated with adverse incidents: Secondary | ICD-10-CM | POA: Diagnosis not present

## 2022-11-09 DIAGNOSIS — I132 Hypertensive heart and chronic kidney disease with heart failure and with stage 5 chronic kidney disease, or end stage renal disease: Secondary | ICD-10-CM | POA: Diagnosis not present

## 2022-11-09 DIAGNOSIS — I5032 Chronic diastolic (congestive) heart failure: Secondary | ICD-10-CM

## 2022-11-09 DIAGNOSIS — Z7901 Long term (current) use of anticoagulants: Secondary | ICD-10-CM | POA: Insufficient documentation

## 2022-11-09 DIAGNOSIS — Z8673 Personal history of transient ischemic attack (TIA), and cerebral infarction without residual deficits: Secondary | ICD-10-CM | POA: Diagnosis not present

## 2022-11-09 DIAGNOSIS — I77819 Aortic ectasia, unspecified site: Secondary | ICD-10-CM | POA: Diagnosis not present

## 2022-11-09 DIAGNOSIS — N186 End stage renal disease: Secondary | ICD-10-CM | POA: Insufficient documentation

## 2022-11-09 DIAGNOSIS — I110 Hypertensive heart disease with heart failure: Secondary | ICD-10-CM | POA: Diagnosis not present

## 2022-11-09 DIAGNOSIS — Z7984 Long term (current) use of oral hypoglycemic drugs: Secondary | ICD-10-CM | POA: Insufficient documentation

## 2022-11-09 DIAGNOSIS — E079 Disorder of thyroid, unspecified: Secondary | ICD-10-CM | POA: Diagnosis not present

## 2022-11-09 DIAGNOSIS — I509 Heart failure, unspecified: Secondary | ICD-10-CM | POA: Diagnosis not present

## 2022-11-09 DIAGNOSIS — T8249XA Other complication of vascular dialysis catheter, initial encounter: Secondary | ICD-10-CM | POA: Diagnosis not present

## 2022-11-09 DIAGNOSIS — I771 Stricture of artery: Secondary | ICD-10-CM | POA: Diagnosis not present

## 2022-11-09 DIAGNOSIS — I482 Chronic atrial fibrillation, unspecified: Secondary | ICD-10-CM | POA: Insufficient documentation

## 2022-11-09 DIAGNOSIS — Z794 Long term (current) use of insulin: Secondary | ICD-10-CM | POA: Insufficient documentation

## 2022-11-09 DIAGNOSIS — E1122 Type 2 diabetes mellitus with diabetic chronic kidney disease: Secondary | ICD-10-CM | POA: Diagnosis not present

## 2022-11-09 DIAGNOSIS — N185 Chronic kidney disease, stage 5: Secondary | ICD-10-CM | POA: Diagnosis not present

## 2022-11-09 DIAGNOSIS — I517 Cardiomegaly: Secondary | ICD-10-CM | POA: Diagnosis not present

## 2022-11-09 HISTORY — DX: Unspecified osteoarthritis, unspecified site: M19.90

## 2022-11-09 HISTORY — PX: EXCHANGE OF A DIALYSIS CATHETER: SHX5818

## 2022-11-09 HISTORY — PX: INSERTION OF DIALYSIS CATHETER: SHX1324

## 2022-11-09 LAB — POCT I-STAT, CHEM 8
BUN: 75 mg/dL — ABNORMAL HIGH (ref 8–23)
Calcium, Ion: 1.09 mmol/L — ABNORMAL LOW (ref 1.15–1.40)
Chloride: 98 mmol/L (ref 98–111)
Creatinine, Ser: 3.6 mg/dL — ABNORMAL HIGH (ref 0.44–1.00)
Glucose, Bld: 150 mg/dL — ABNORMAL HIGH (ref 70–99)
HCT: 41 % (ref 36.0–46.0)
Hemoglobin: 13.9 g/dL (ref 12.0–15.0)
Potassium: 5.4 mmol/L — ABNORMAL HIGH (ref 3.5–5.1)
Sodium: 132 mmol/L — ABNORMAL LOW (ref 135–145)
TCO2: 27 mmol/L (ref 22–32)

## 2022-11-09 LAB — GLUCOSE, CAPILLARY
Glucose-Capillary: 146 mg/dL — ABNORMAL HIGH (ref 70–99)
Glucose-Capillary: 129 mg/dL — ABNORMAL HIGH (ref 70–99)
Glucose-Capillary: 118 mg/dL — ABNORMAL HIGH (ref 70–99)

## 2022-11-09 SURGERY — EXCHANGE OF A DIALYSIS CATHETER
Anesthesia: Monitor Anesthesia Care | Site: Neck | Laterality: Right

## 2022-11-09 MED ORDER — LIDOCAINE HCL (PF) 1 % IJ SOLN
INTRAMUSCULAR | Status: AC
  Filled 2022-11-09: qty 30

## 2022-11-09 MED ORDER — ORAL CARE MOUTH RINSE: 15.0000 mL | Freq: Once | OROMUCOSAL | Status: AC

## 2022-11-09 MED ORDER — DROPERIDOL 2.5 MG/ML IJ SOLN: 0.6250 mg | Freq: Once | INTRAMUSCULAR | Status: DC | PRN

## 2022-11-09 MED ORDER — LIDOCAINE HCL 1 % IJ SOLN
INTRAMUSCULAR | Status: DC | PRN
  Administered 2022-11-09: 18 mL

## 2022-11-09 MED ORDER — 0.9 % SODIUM CHLORIDE (POUR BTL) OPTIME
TOPICAL | Status: DC | PRN
  Administered 2022-11-09: 1000 mL

## 2022-11-09 MED ORDER — HYDRALAZINE HCL 20 MG/ML IJ SOLN
10.0000 mg | Freq: Once | INTRAMUSCULAR | Status: AC
  Administered 2022-11-09: 10 mg via INTRAVENOUS

## 2022-11-09 MED ORDER — HEPARIN SODIUM (PORCINE) 1000 UNIT/ML IJ SOLN
INTRAMUSCULAR | Status: DC | PRN
  Administered 2022-11-09: 1000 [IU] via INTRAVENOUS

## 2022-11-09 MED ORDER — IODIXANOL 320 MG/ML IV SOLN
INTRAVENOUS | Status: DC | PRN
  Administered 2022-11-09: 30 mL

## 2022-11-09 MED ORDER — CEFAZOLIN SODIUM-DEXTROSE 2-4 GM/100ML-% IV SOLN
INTRAVENOUS | Status: AC
  Filled 2022-11-09: qty 100

## 2022-11-09 MED ORDER — HEPARIN 6000 UNIT IRRIGATION SOLUTION
Status: AC
  Filled 2022-11-09: qty 500

## 2022-11-09 MED ORDER — HEPARIN 6000 UNIT IRRIGATION SOLUTION
Status: DC | PRN
  Administered 2022-11-09: 1

## 2022-11-09 MED ORDER — PROPOFOL 10 MG/ML IV BOLUS
INTRAVENOUS | Status: DC | PRN
  Administered 2022-11-09: 10 mg via INTRAVENOUS

## 2022-11-09 MED ORDER — CHLORHEXIDINE GLUCONATE 4 % EX SOLN: 60.0000 mL | Freq: Once | CUTANEOUS | Status: DC

## 2022-11-09 MED ORDER — INSULIN ASPART 100 UNIT/ML IJ SOLN: 0.0000 [IU] | INTRAMUSCULAR | Status: DC | PRN

## 2022-11-09 MED ORDER — HYDRALAZINE HCL 20 MG/ML IJ SOLN
INTRAMUSCULAR | Status: AC
  Filled 2022-11-09: qty 1

## 2022-11-09 MED ORDER — CHLORHEXIDINE GLUCONATE 0.12 % MT SOLN
OROMUCOSAL | Status: AC
  Administered 2022-11-09: 15 mL via OROMUCOSAL
  Filled 2022-11-09: qty 15

## 2022-11-09 MED ORDER — CEFAZOLIN SODIUM-DEXTROSE 2-4 GM/100ML-% IV SOLN
2.0000 g | INTRAVENOUS | Status: AC
  Administered 2022-11-09: 2 g via INTRAVENOUS

## 2022-11-09 MED ORDER — FENTANYL CITRATE (PF) 250 MCG/5ML IJ SOLN
INTRAMUSCULAR | Status: AC
  Filled 2022-11-09: qty 5

## 2022-11-09 MED ORDER — FENTANYL CITRATE (PF) 250 MCG/5ML IJ SOLN
INTRAMUSCULAR | Status: DC | PRN
  Administered 2022-11-09 (×2): 50 ug via INTRAVENOUS

## 2022-11-09 MED ORDER — SODIUM CHLORIDE 0.9 % IV SOLN: INTRAVENOUS | Status: DC

## 2022-11-09 MED ORDER — ACETAMINOPHEN 500 MG PO TABS
1000.0000 mg | ORAL_TABLET | Freq: Once | ORAL | Status: AC
  Administered 2022-11-09: 1000 mg via ORAL
  Filled 2022-11-09: qty 2

## 2022-11-09 MED ORDER — PHENYLEPHRINE 80 MCG/ML (10ML) SYRINGE FOR IV PUSH (FOR BLOOD PRESSURE SUPPORT)
PREFILLED_SYRINGE | INTRAVENOUS | Status: AC
  Filled 2022-11-09: qty 10

## 2022-11-09 MED ORDER — FENTANYL CITRATE (PF) 100 MCG/2ML IJ SOLN: 25.0000 ug | INTRAMUSCULAR | Status: DC | PRN

## 2022-11-09 MED ORDER — PROPOFOL 500 MG/50ML IV EMUL
INTRAVENOUS | Status: DC | PRN
  Administered 2022-11-09: 30 ug/kg/min via INTRAVENOUS

## 2022-11-09 MED ORDER — PROPOFOL 10 MG/ML IV BOLUS
INTRAVENOUS | Status: AC
  Filled 2022-11-09: qty 20

## 2022-11-09 MED ORDER — LACTATED RINGERS IV SOLN: INTRAVENOUS | Status: DC

## 2022-11-09 MED ORDER — EPHEDRINE 5 MG/ML INJ
INTRAVENOUS | Status: AC
  Filled 2022-11-09: qty 5

## 2022-11-09 MED ORDER — CHLORHEXIDINE GLUCONATE 0.12 % MT SOLN: 15.0000 mL | Freq: Once | OROMUCOSAL | Status: AC

## 2022-11-09 MED ORDER — HEPARIN SODIUM (PORCINE) 1000 UNIT/ML IJ SOLN
INTRAMUSCULAR | Status: AC
  Filled 2022-11-09: qty 10

## 2022-11-09 SURGICAL SUPPLY — 44 items
ADH SKN CLS APL DERMABOND .7 (GAUZE/BANDAGES/DRESSINGS) ×2
BAG BANDED W/RUBBER/TAPE 36X54 (MISCELLANEOUS) IMPLANT
BAG COUNTER SPONGE SURGICOUNT (BAG) ×3 IMPLANT
BAG DECANTER FOR FLEXI CONT (MISCELLANEOUS) ×3 IMPLANT
BAG EQP BAND 135X91 W/RBR TAPE (MISCELLANEOUS) ×2
BAG SPNG CNTER NS LX DISP (BAG) ×2
BIOPATCH RED 1 DISK 7.0 (GAUZE/BANDAGES/DRESSINGS) ×3 IMPLANT
CATH PALINDROME-P 19CM W/VT (CATHETERS) IMPLANT
CATH PALINDROME-P 23CM W/VT (CATHETERS) IMPLANT
CATH PALINDROME-P 28CM W/VT (CATHETERS) IMPLANT
COVER PROBE W GEL 5X96 (DRAPES) ×3 IMPLANT
COVER SURGICAL LIGHT HANDLE (MISCELLANEOUS) ×3 IMPLANT
DERMABOND ADVANCED .7 DNX12 (GAUZE/BANDAGES/DRESSINGS) IMPLANT
DRAPE CHEST BREAST 15X10 FENES (DRAPES) ×3 IMPLANT
GAUZE 4X4 16PLY ~~LOC~~+RFID DBL (SPONGE) ×3 IMPLANT
GLIDEWIRE ADV .035X180CM (WIRE) IMPLANT
GLOVE BIOGEL PI IND STRL 8 (GLOVE) ×3 IMPLANT
GOWN STRL REUS W/ TWL LRG LVL3 (GOWN DISPOSABLE) ×6 IMPLANT
GOWN STRL REUS W/TWL 2XL LVL3 (GOWN DISPOSABLE) ×6 IMPLANT
GOWN STRL REUS W/TWL LRG LVL3 (GOWN DISPOSABLE) ×2
KIT BASIN OR (CUSTOM PROCEDURE TRAY) ×3 IMPLANT
KIT PALINDROME-P 55CM (CATHETERS) IMPLANT
KIT TURNOVER KIT B (KITS) ×3 IMPLANT
NDL 18GX1X1/2 (RX/OR ONLY) (NEEDLE) ×3 IMPLANT
NDL HYPO 25GX1X1/2 BEV (NEEDLE) ×3 IMPLANT
NEEDLE 18GX1X1/2 (RX/OR ONLY) (NEEDLE) IMPLANT
NEEDLE HYPO 25GX1X1/2 BEV (NEEDLE) ×2 IMPLANT
NS IRRIG 1000ML POUR BTL (IV SOLUTION) ×3 IMPLANT
PACK BASIC III (CUSTOM PROCEDURE TRAY) ×2
PACK SRG BSC III STRL LF ECLPS (CUSTOM PROCEDURE TRAY) ×3 IMPLANT
PAD ARMBOARD 7.5X6 YLW CONV (MISCELLANEOUS) ×6 IMPLANT
SET MICROPUNCTURE 5F STIFF (MISCELLANEOUS) IMPLANT
SOAP 2 % CHG 4 OZ (WOUND CARE) ×3 IMPLANT
SPONGE T-LAP 18X18 ~~LOC~~+RFID (SPONGE) IMPLANT
SUT ETHILON 3 0 PS 1 (SUTURE) ×3 IMPLANT
SUT MNCRL AB 4-0 PS2 18 (SUTURE) ×3 IMPLANT
SYR 10ML LL (SYRINGE) ×3 IMPLANT
SYR 20ML LL LF (SYRINGE) ×6 IMPLANT
SYR 30ML LL (SYRINGE) IMPLANT
SYR 5ML LL (SYRINGE) ×3 IMPLANT
SYR CONTROL 10ML LL (SYRINGE) ×3 IMPLANT
TOWEL GREEN STERILE (TOWEL DISPOSABLE) ×3 IMPLANT
WATER STERILE IRR 1000ML POUR (IV SOLUTION) ×3 IMPLANT
WIRE AMPLATZ SS-J .035X180CM (WIRE) IMPLANT

## 2022-11-09 NOTE — Transfer of Care (Signed)
Immediate Anesthesia Transfer of Care Note  Patient: Chelsea Meza  Procedure(s) Performed: EXCHANGE OF A TUNNELED DIALYSIS CATHETER (Right: Neck) INSERTION OF DIALYSIS CATHETER USING 19cm PALINDROME PRECISION CHRONIC CATHETER (Left: Neck)  Patient Location: PACU  Anesthesia Type:MAC  Level of Consciousness: drowsy and patient cooperative  Airway & Oxygen Therapy: Patient Spontanous Breathing  Post-op Assessment: Report given to RN and Post -op Vital signs reviewed and stable  Post vital signs: Reviewed and stable  Last Vitals:  Vitals Value Taken Time  BP 143/90 11/09/22 1645  Temp 36.4 C 11/09/22 1645  Pulse 62 11/09/22 1645  Resp 19 11/09/22 1645  SpO2 98 % 11/09/22 1645  Vitals shown include unfiled device data.  Last Pain:  Vitals:   11/09/22 0931  TempSrc:   PainSc: 0-No pain         Complications: No notable events documented.

## 2022-11-09 NOTE — Op Note (Signed)
    NAME: Chelsea Meza    MRN: [id number] DOB: [birth year]    DATE OF OPERATION: 11/09/2022  PREOP DIAGNOSIS:    End-stage renal disease requiring dialysis  POSTOP DIAGNOSIS:    Same  PROCEDURE:    Removal of right-sided tunneled dialysis catheter, placement of left-sided internal jugular vein tunneled dialysis catheter  SURGEON: Victorino Sparrow  ASSIST: None  ANESTHESIA: Moderate  EBL: 15 mL  INDICATIONS:    Chelsea Meza is a 87 y.o. female with tunneled dialysis catheter for HD access requiring exchange due to malfunction.  After discussing risk and benefits of this, Maudene elected to proceed.  FINDINGS:   Left-sided internal jugular 23 cm palindrome placed into the superior vena cava.  TECHNIQUE:   Patient is brought to the OR laid in supine position.  Moderate anesthesia was induced the patient was prepped and draped in standard fashion.  The case began with manual palpation of the previously placed tunneled dialysis catheter in the right neck.  A counterincision was made at the neck and the shaft of the catheter mobilized.  Next, the catheter was held in place using a Kelly clamp. It was then transected and the portion in the chest wall removed.  Next, a wire was used using Seldinger technique in an effort to exchange the catheter.  A new tunnel tract was made in the right chest.  Unfortunately when the catheter was being placed under Seldinger technique, the wire was inadvertently pulled, losing access.  I attempted repeat access in the right neck.  The patient had tortuous arterial anatomy.  I used an ultrasound-guided micropuncture needle, and access what I felt was the internal jugular vein.  The carotid was accessed and a 4 French sheath placed.  I elected to deliver some contrast in an effort to elucidate whether I was in the artery or vein as the wire was behaving abnormally. This showed arterial flow, therefore the 4 French sheath was pulled, and manual  pressure held for 10 minutes.  There was no hematoma.  I used the ultrasound again, and appreciated significant tortuosity of the arterial system.  I was unable to visualize the right internal jugular vein, therefore I moved to the left neck.  Using ultrasound guidance the left internal jugular vein was accessed with micropuncture technique.  Through the micropuncture sheath a Glidewire advantage was advanced into the superior vena cava.  A small incision was made around the skin access point.  A counterincision was made in the chest under the clavicle.  A 23 cm tunneled dialysis catheter was then tunneled under the skin, over the clavicle into the incision in the neck.  The access point was serially dilated under direct fluoroscopic guidance.  A peel-away sheath was introduced into the superior vena cava under fluoroscopic guidance.  The tunneling device was removed and the catheter fed through the peel-away sheath into the superior vena cava.  The peel-away sheath was removed and the catheter gently pulled back.  Adequate position was confirmed with x-ray.  The catheter was tested and found to flush and draw back well.  Catheter was heparin locked.  Caps were applied.  Catheter was sutured to the skin.  The neck incision was closed with 4-0 Monocryl.     Ladonna Snide, MD [facility] DATE OF DICTATION:   11/09/2022

## 2022-11-09 NOTE — H&P (Signed)
Patient name: Chelsea Meza MRN: [id number] DOB: October 28, 1935 Sex: female    HPI: Chelsea Meza is a 87 y.o. female here today for malfunctioning right-sided tunneled dialysis catheter.  Our office was called for tunneled dialysis catheter exchange.  On exam, the patient was doing well, accompanied by her daughter.  She had no complaints.  Past Medical History:  Diagnosis Date   Anemia    Arthritis    Chronic atrial fibrillation ([name])    CKD (chronic kidney disease) stage 3, GFR 30-59 ml/min ([name])    Diabetes mellitus without complication ([name])    Hyperlipidemia    Hypertension    Localized edema    Other specified disorders of thyroid    TIA (transient ischemic attack)    Unspecified convulsions ([name])     Family History  Problem Relation Age of Onset   Stroke Mother    Stroke Maternal Grandmother     SOCIAL HISTORY: Social History   Tobacco Use   Smoking status: Never   Smokeless tobacco: Never  Substance Use Topics   Alcohol use: No    No Known Allergies  Current Facility-Administered Medications  Medication Dose Route Frequency Provider Last Rate Last Admin   0.9 %  sodium chloride infusion   Intravenous Continuous Victorino Sparrow, MD       ceFAZolin (ANCEF) 2-4 GM/100ML-% IVPB            ceFAZolin (ANCEF) IVPB 2g/100 mL premix  2 g Intravenous 30 min Pre-Op Victorino Sparrow, MD       chlorhexidine (HIBICLENS) 4 % liquid 4 Application  60 mL Topical Once Victorino Sparrow, MD       And   [START ON 11/10/2022] chlorhexidine (HIBICLENS) 4 % liquid 4 Application  60 mL Topical Once Victorino Sparrow, MD       insulin aspart (novoLOG) injection 0-7 Units  0-7 Units Subcutaneous Q2H PRN Lewie Loron, MD       lactated ringers infusion   Intravenous Continuous Lewie Loron, MD        REVIEW OF SYSTEMS:  [X]  denotes positive finding, [ ]  denotes negative finding Cardiac  Comments:  Chest pain or chest pressure:     Shortness of breath upon exertion:    Short of breath when lying flat:    Irregular heart rhythm:        Vascular    Pain in calf, thigh, or hip brought on by ambulation:    Pain in feet at night that wakes you up from your sleep:     Blood clot in your veins:    Leg swelling:           PHYSICAL EXAM: Vitals:   11/09/22 0908  BP: (!) 147/71  Pulse: 79  Resp: 20  Temp: 98.1 F (36.7 C)  TempSrc: Oral  SpO2: 97%  Weight: 86.1 kg  Height: 5\' 9"  (1.753 m)    GENERAL: The patient is a well-nourished female, in no acute distress. The vital signs are documented above. CARDIOVASCULAR: 2+ radial pulses bilaterally.  Small surface veins bilaterally PULMONARY: There is good air exchange  MUSCULOSKELETAL: There are no major deformities or cyanosis. NEUROLOGIC: No focal weakness or paresthesias are detected. SKIN: There are no ulcers or rashes noted. PSYCHIATRIC: The patient has a normal affect.  DATA:  None   Assessment and plan: Plan for right-sided tunneled dialysis catheter exchange.  Plan to do this over wire.  After discussing the risk and  benefits, she elected to proceed.    Victorino Sparrow MD

## 2022-11-10 ENCOUNTER — Encounter (HOSPITAL_COMMUNITY): Payer: Self-pay | Admitting: Vascular Surgery

## 2022-11-10 DIAGNOSIS — E43 Unspecified severe protein-calorie malnutrition: Secondary | ICD-10-CM | POA: Diagnosis not present

## 2022-11-10 DIAGNOSIS — N186 End stage renal disease: Secondary | ICD-10-CM | POA: Diagnosis not present

## 2022-11-11 NOTE — Anesthesia Postprocedure Evaluation (Signed)
Anesthesia Post Note  Patient: Chelsea Meza  Procedure(s) Performed: EXCHANGE OF A TUNNELED DIALYSIS CATHETER (Right: Neck) INSERTION OF DIALYSIS CATHETER USING 19cm PALINDROME PRECISION CHRONIC CATHETER (Left: Neck)     Patient location during evaluation: PACU Anesthesia Type: MAC Level of consciousness: awake and alert Pain management: pain level controlled Vital Signs Assessment: post-procedure vital signs reviewed and stable Respiratory status: spontaneous breathing, nonlabored ventilation and respiratory function stable Cardiovascular status: blood pressure returned to baseline Postop Assessment: no apparent nausea or vomiting Anesthetic complications: no   No notable events documented.  Last Vitals:  Vitals:   11/09/22 1830 11/09/22 1845  BP: (!) 179/87 (!) 168/77  Pulse: 74 73  Resp: 19 20  Temp:  36.4 C  SpO2: 99% 98%    Last Pain:  Vitals:   11/09/22 1845  TempSrc:   PainSc: 0-No pain                 Shanda Howells

## 2022-11-13 DIAGNOSIS — E43 Unspecified severe protein-calorie malnutrition: Secondary | ICD-10-CM | POA: Diagnosis not present

## 2022-11-13 DIAGNOSIS — Z992 Dependence on renal dialysis: Secondary | ICD-10-CM | POA: Diagnosis not present

## 2022-11-13 DIAGNOSIS — N186 End stage renal disease: Secondary | ICD-10-CM | POA: Diagnosis not present

## 2022-11-15 DIAGNOSIS — N186 End stage renal disease: Secondary | ICD-10-CM | POA: Diagnosis not present

## 2022-11-15 DIAGNOSIS — Z992 Dependence on renal dialysis: Secondary | ICD-10-CM | POA: Diagnosis not present

## 2022-11-15 DIAGNOSIS — E43 Unspecified severe protein-calorie malnutrition: Secondary | ICD-10-CM | POA: Diagnosis not present

## 2022-11-17 DIAGNOSIS — E43 Unspecified severe protein-calorie malnutrition: Secondary | ICD-10-CM | POA: Diagnosis not present

## 2022-11-17 DIAGNOSIS — N186 End stage renal disease: Secondary | ICD-10-CM | POA: Diagnosis not present

## 2022-11-17 DIAGNOSIS — Z992 Dependence on renal dialysis: Secondary | ICD-10-CM | POA: Diagnosis not present

## 2022-11-20 DIAGNOSIS — E43 Unspecified severe protein-calorie malnutrition: Secondary | ICD-10-CM | POA: Diagnosis not present

## 2022-11-20 DIAGNOSIS — Z992 Dependence on renal dialysis: Secondary | ICD-10-CM | POA: Diagnosis not present

## 2022-11-20 DIAGNOSIS — N186 End stage renal disease: Secondary | ICD-10-CM | POA: Diagnosis not present

## 2022-11-21 DIAGNOSIS — K641 Second degree hemorrhoids: Secondary | ICD-10-CM | POA: Diagnosis not present

## 2022-11-21 DIAGNOSIS — Z79899 Other long term (current) drug therapy: Secondary | ICD-10-CM | POA: Diagnosis not present

## 2022-11-22 DIAGNOSIS — Z992 Dependence on renal dialysis: Secondary | ICD-10-CM | POA: Diagnosis not present

## 2022-11-22 DIAGNOSIS — N186 End stage renal disease: Secondary | ICD-10-CM | POA: Diagnosis not present

## 2022-11-22 DIAGNOSIS — E43 Unspecified severe protein-calorie malnutrition: Secondary | ICD-10-CM | POA: Diagnosis not present

## 2022-11-24 DIAGNOSIS — E43 Unspecified severe protein-calorie malnutrition: Secondary | ICD-10-CM | POA: Diagnosis not present

## 2022-11-24 DIAGNOSIS — N186 End stage renal disease: Secondary | ICD-10-CM | POA: Diagnosis not present

## 2022-11-24 DIAGNOSIS — Z992 Dependence on renal dialysis: Secondary | ICD-10-CM | POA: Diagnosis not present

## 2022-11-27 DIAGNOSIS — N186 End stage renal disease: Secondary | ICD-10-CM | POA: Diagnosis not present

## 2022-11-27 DIAGNOSIS — Z992 Dependence on renal dialysis: Secondary | ICD-10-CM | POA: Diagnosis not present

## 2022-11-27 DIAGNOSIS — E43 Unspecified severe protein-calorie malnutrition: Secondary | ICD-10-CM | POA: Diagnosis not present

## 2022-11-28 ENCOUNTER — Ambulatory Visit: Payer: 59 | Admitting: Obstetrics & Gynecology

## 2022-11-29 DIAGNOSIS — E43 Unspecified severe protein-calorie malnutrition: Secondary | ICD-10-CM | POA: Diagnosis not present

## 2022-11-29 DIAGNOSIS — N186 End stage renal disease: Secondary | ICD-10-CM | POA: Diagnosis not present

## 2022-11-29 DIAGNOSIS — Z992 Dependence on renal dialysis: Secondary | ICD-10-CM | POA: Diagnosis not present

## 2022-12-01 DIAGNOSIS — N186 End stage renal disease: Secondary | ICD-10-CM | POA: Diagnosis not present

## 2022-12-01 DIAGNOSIS — E43 Unspecified severe protein-calorie malnutrition: Secondary | ICD-10-CM | POA: Diagnosis not present

## 2022-12-04 DIAGNOSIS — R7881 Bacteremia: Secondary | ICD-10-CM | POA: Diagnosis not present

## 2022-12-04 DIAGNOSIS — B9689 Other specified bacterial agents as the cause of diseases classified elsewhere: Secondary | ICD-10-CM | POA: Diagnosis not present

## 2022-12-04 DIAGNOSIS — Z992 Dependence on renal dialysis: Secondary | ICD-10-CM | POA: Diagnosis not present

## 2022-12-04 DIAGNOSIS — D72829 Elevated white blood cell count, unspecified: Secondary | ICD-10-CM | POA: Diagnosis not present

## 2022-12-04 DIAGNOSIS — E8779 Other fluid overload: Secondary | ICD-10-CM | POA: Diagnosis not present

## 2022-12-04 DIAGNOSIS — E43 Unspecified severe protein-calorie malnutrition: Secondary | ICD-10-CM | POA: Diagnosis not present

## 2022-12-04 DIAGNOSIS — N186 End stage renal disease: Secondary | ICD-10-CM | POA: Diagnosis not present

## 2022-12-05 DIAGNOSIS — E8779 Other fluid overload: Secondary | ICD-10-CM | POA: Diagnosis not present

## 2022-12-05 DIAGNOSIS — B9689 Other specified bacterial agents as the cause of diseases classified elsewhere: Secondary | ICD-10-CM | POA: Diagnosis not present

## 2022-12-05 DIAGNOSIS — R7881 Bacteremia: Secondary | ICD-10-CM | POA: Diagnosis not present

## 2022-12-05 DIAGNOSIS — N186 End stage renal disease: Secondary | ICD-10-CM | POA: Diagnosis not present

## 2022-12-05 DIAGNOSIS — Z992 Dependence on renal dialysis: Secondary | ICD-10-CM | POA: Diagnosis not present

## 2022-12-05 DIAGNOSIS — D72829 Elevated white blood cell count, unspecified: Secondary | ICD-10-CM | POA: Diagnosis not present

## 2022-12-06 DIAGNOSIS — E8779 Other fluid overload: Secondary | ICD-10-CM | POA: Diagnosis not present

## 2022-12-06 DIAGNOSIS — B9689 Other specified bacterial agents as the cause of diseases classified elsewhere: Secondary | ICD-10-CM | POA: Diagnosis not present

## 2022-12-06 DIAGNOSIS — R7881 Bacteremia: Secondary | ICD-10-CM | POA: Diagnosis not present

## 2022-12-06 DIAGNOSIS — D72829 Elevated white blood cell count, unspecified: Secondary | ICD-10-CM | POA: Diagnosis not present

## 2022-12-06 DIAGNOSIS — E43 Unspecified severe protein-calorie malnutrition: Secondary | ICD-10-CM | POA: Diagnosis not present

## 2022-12-06 DIAGNOSIS — Z992 Dependence on renal dialysis: Secondary | ICD-10-CM | POA: Diagnosis not present

## 2022-12-06 DIAGNOSIS — N186 End stage renal disease: Secondary | ICD-10-CM | POA: Diagnosis not present

## 2022-12-08 DIAGNOSIS — E43 Unspecified severe protein-calorie malnutrition: Secondary | ICD-10-CM | POA: Diagnosis not present

## 2022-12-08 DIAGNOSIS — N186 End stage renal disease: Secondary | ICD-10-CM | POA: Diagnosis not present

## 2022-12-11 ENCOUNTER — Encounter (HOSPITAL_COMMUNITY): Payer: Self-pay | Admitting: Pharmacy Technician

## 2022-12-11 ENCOUNTER — Emergency Department (HOSPITAL_COMMUNITY): Payer: 59

## 2022-12-11 ENCOUNTER — Emergency Department (HOSPITAL_COMMUNITY)
Admission: EM | Admit: 2022-12-11 | Discharge: 2022-12-11 | Disposition: A | Discharge status: Home / Self Care | Payer: 59 | Attending: Emergency Medicine | Admitting: Emergency Medicine

## 2022-12-11 ENCOUNTER — Other Ambulatory Visit: Payer: Self-pay

## 2022-12-11 DIAGNOSIS — Z794 Long term (current) use of insulin: Secondary | ICD-10-CM | POA: Diagnosis not present

## 2022-12-11 DIAGNOSIS — N289 Disorder of kidney and ureter, unspecified: Secondary | ICD-10-CM | POA: Diagnosis not present

## 2022-12-11 DIAGNOSIS — Z7401 Bed confinement status: Secondary | ICD-10-CM | POA: Diagnosis not present

## 2022-12-11 DIAGNOSIS — D72829 Elevated white blood cell count, unspecified: Secondary | ICD-10-CM | POA: Insufficient documentation

## 2022-12-11 DIAGNOSIS — Z7901 Long term (current) use of anticoagulants: Secondary | ICD-10-CM | POA: Diagnosis not present

## 2022-12-11 DIAGNOSIS — R519 Headache, unspecified: Secondary | ICD-10-CM | POA: Insufficient documentation

## 2022-12-11 DIAGNOSIS — I517 Cardiomegaly: Secondary | ICD-10-CM | POA: Diagnosis not present

## 2022-12-11 DIAGNOSIS — E1122 Type 2 diabetes mellitus with diabetic chronic kidney disease: Secondary | ICD-10-CM | POA: Insufficient documentation

## 2022-12-11 DIAGNOSIS — G9389 Other specified disorders of brain: Secondary | ICD-10-CM | POA: Diagnosis not present

## 2022-12-11 DIAGNOSIS — I132 Hypertensive heart and chronic kidney disease with heart failure and with stage 5 chronic kidney disease, or end stage renal disease: Secondary | ICD-10-CM | POA: Diagnosis not present

## 2022-12-11 DIAGNOSIS — I120 Hypertensive chronic kidney disease with stage 5 chronic kidney disease or end stage renal disease: Secondary | ICD-10-CM | POA: Diagnosis not present

## 2022-12-11 DIAGNOSIS — Z743 Need for continuous supervision: Secondary | ICD-10-CM | POA: Diagnosis not present

## 2022-12-11 DIAGNOSIS — I3139 Other pericardial effusion (noninflammatory): Secondary | ICD-10-CM | POA: Diagnosis not present

## 2022-12-11 DIAGNOSIS — I499 Cardiac arrhythmia, unspecified: Secondary | ICD-10-CM | POA: Diagnosis not present

## 2022-12-11 DIAGNOSIS — N186 End stage renal disease: Secondary | ICD-10-CM

## 2022-12-11 DIAGNOSIS — E8779 Other fluid overload: Secondary | ICD-10-CM | POA: Diagnosis not present

## 2022-12-11 DIAGNOSIS — Z79899 Other long term (current) drug therapy: Secondary | ICD-10-CM | POA: Diagnosis not present

## 2022-12-11 DIAGNOSIS — M79603 Pain in arm, unspecified: Secondary | ICD-10-CM | POA: Diagnosis not present

## 2022-12-11 DIAGNOSIS — B9689 Other specified bacterial agents as the cause of diseases classified elsewhere: Secondary | ICD-10-CM | POA: Diagnosis not present

## 2022-12-11 DIAGNOSIS — R7881 Bacteremia: Secondary | ICD-10-CM | POA: Diagnosis not present

## 2022-12-11 DIAGNOSIS — I509 Heart failure, unspecified: Secondary | ICD-10-CM | POA: Diagnosis not present

## 2022-12-11 DIAGNOSIS — K838 Other specified diseases of biliary tract: Secondary | ICD-10-CM | POA: Diagnosis not present

## 2022-12-11 DIAGNOSIS — R6889 Other general symptoms and signs: Secondary | ICD-10-CM | POA: Diagnosis not present

## 2022-12-11 DIAGNOSIS — E43 Unspecified severe protein-calorie malnutrition: Secondary | ICD-10-CM | POA: Diagnosis not present

## 2022-12-11 DIAGNOSIS — R531 Weakness: Secondary | ICD-10-CM | POA: Diagnosis not present

## 2022-12-11 DIAGNOSIS — Z992 Dependence on renal dialysis: Secondary | ICD-10-CM | POA: Insufficient documentation

## 2022-12-11 DIAGNOSIS — R197 Diarrhea, unspecified: Secondary | ICD-10-CM | POA: Diagnosis not present

## 2022-12-11 LAB — BASIC METABOLIC PANEL
Anion gap: 15 (ref 5–15)
BUN: 83 mg/dL — ABNORMAL HIGH (ref 8–23)
CO2: 22 mmol/L (ref 22–32)
Calcium: 8.7 mg/dL — ABNORMAL LOW (ref 8.9–10.3)
Chloride: 95 mmol/L — ABNORMAL LOW (ref 98–111)
Creatinine, Ser: 4.49 mg/dL — ABNORMAL HIGH (ref 0.44–1.00)
GFR, Estimated: 9 mL/min — ABNORMAL LOW (ref >60)
Glucose, Bld: 199 mg/dL — ABNORMAL HIGH (ref 70–99)
Potassium: 4.3 mmol/L (ref 3.5–5.1)
Sodium: 132 mmol/L — ABNORMAL LOW (ref 135–145)

## 2022-12-11 LAB — URINALYSIS, ROUTINE W REFLEX MICROSCOPIC
Bacteria, UA: NONE SEEN
Bilirubin Urine: NEGATIVE
Glucose, UA: 150 mg/dL — AB
Hgb urine dipstick: NEGATIVE
Ketones, ur: NEGATIVE mg/dL
Leukocytes,Ua: NEGATIVE
Nitrite: NEGATIVE
Protein, ur: 100 mg/dL — AB
Specific Gravity, Urine: 1.011 (ref 1.005–1.030)
pH: 6 (ref 5.0–8.0)

## 2022-12-11 LAB — CBC WITH DIFFERENTIAL/PLATELET
Abs Immature Granulocytes: 0 10*3/uL (ref 0.00–0.07)
Basophils Absolute: 0 10*3/uL (ref 0.0–0.1)
Basophils Relative: 0 %
Eosinophils Absolute: 0 10*3/uL (ref 0.0–0.5)
Eosinophils Relative: 0 %
HCT: 31.8 % — ABNORMAL LOW (ref 36.0–46.0)
Hemoglobin: 10.3 g/dL — ABNORMAL LOW (ref 12.0–15.0)
Lymphocytes Relative: 6 %
Lymphs Abs: 1.1 10*3/uL (ref 0.7–4.0)
MCH: 30.7 pg (ref 26.0–34.0)
MCHC: 32.4 g/dL (ref 30.0–36.0)
MCV: 94.6 fL (ref 80.0–100.0)
Monocytes Absolute: 0.6 10*3/uL (ref 0.1–1.0)
Monocytes Relative: 3 %
Neutro Abs: 17.3 10*3/uL — ABNORMAL HIGH (ref 1.7–7.7)
Neutrophils Relative %: 91 %
Platelets: 216 10*3/uL (ref 150–400)
RBC: 3.36 MIL/uL — ABNORMAL LOW (ref 3.87–5.11)
RDW: 16.1 % — ABNORMAL HIGH (ref 11.5–15.5)
WBC: 19 10*3/uL — ABNORMAL HIGH (ref 4.0–10.5)
nRBC: 0 % (ref 0.0–0.2)

## 2022-12-11 LAB — HEPATIC FUNCTION PANEL
ALT: 19 U/L (ref 0–44)
AST: 21 U/L (ref 15–41)
Albumin: 2.9 g/dL — ABNORMAL LOW (ref 3.5–5.0)
Alkaline Phosphatase: 68 U/L (ref 38–126)
Bilirubin, Direct: 0.1 mg/dL (ref 0.0–0.2)
Indirect Bilirubin: 0.5 mg/dL (ref 0.3–0.9)
Total Bilirubin: 0.6 mg/dL (ref 0.3–1.2)
Total Protein: 6.7 g/dL (ref 6.5–8.1)

## 2022-12-11 LAB — TROPONIN I (HIGH SENSITIVITY)
Troponin I (High Sensitivity): 18 ng/L — ABNORMAL HIGH (ref <18)
Troponin I (High Sensitivity): 20 ng/L — ABNORMAL HIGH (ref <18)

## 2022-12-11 LAB — MAGNESIUM: Magnesium: 1.9 mg/dL (ref 1.7–2.4)

## 2022-12-11 LAB — LIPASE, BLOOD: Lipase: 29 U/L (ref 11–51)

## 2022-12-11 MED ORDER — CARVEDILOL 3.125 MG PO TABS
3.1250 mg | ORAL_TABLET | Freq: Once | ORAL | Status: AC
  Administered 2022-12-11: 3.125 mg via ORAL
  Filled 2022-12-11: qty 1

## 2022-12-11 MED ORDER — ACETAMINOPHEN 325 MG PO TABS
650.0000 mg | ORAL_TABLET | Freq: Once | ORAL | Status: AC
  Administered 2022-12-11: 650 mg via ORAL
  Filled 2022-12-11: qty 2

## 2022-12-11 NOTE — Discharge Instructions (Signed)
It was our pleasure to provide your ER care today - we hope that you feel better.  Overall, your ED workup and lab results look good. Your white blood cell count is elevated, but no source or area of infection is noted.   Drink adequate fluids/stay well hydrated.   Follow up closely with primary care doctor in the next few days.   Return to ER if worse, new symptoms, fevers, new or severe pain, chest pain, trouble breathing, abdominal pain, persistent vomiting, severe diarrhea, or other concern.

## 2022-12-11 NOTE — ED Triage Notes (Signed)
Pt bib ems from dialysis with complaints of generalized weakness and diarrhea onset yesterday. Pt also complains of L sided headache. Pt unable to make it to dialysis routinely. Staff at dialysis states she is 6L over. Afib on the monitor (hx of same).

## 2022-12-11 NOTE — ED Provider Notes (Signed)
[place] EMERGENCY DEPARTMENT AT [facility] Provider Note   CSN: [id number] Arrival date & time: 12/11/22  1200     History  Chief Complaint  Patient presents with   Diarrhea    Chelsea Meza is a 87 y.o. female.   Diarrhea Associated symptoms: abdominal pain, headaches and myalgias   Patient presents for multiple complaints.  Medical history includes DM, atrial fibrillation, ESRD, HTN, CHF, seizures.  She resides in [facility] skilled nursing facility.  She has been nonambulatory for the past 3 years due to bilateral lower extremity weakness.  She typically undergoes dialysis on M, W, F.  She did not go on Friday because she did not feel like it.  Over the past 2 days, she has had diarrhea.  Staff at facility told her it was nonbloody.  She did go to dialysis today.  While undergoing dialysis, she endorsed headache and left arm pain.  Dialysis session was cut short and she was sent to the ED.  Currently, she endorses a left-sided headache, left ear pain, pain throughout her left arm, mild abdominal pain.  She states that she will have intermittent nausea but denies any currently.     Home Medications Prior to Admission medications   Medication Sig Start Date End Date Taking? Authorizing Provider  Accu-Chek FastClix Lancets MISC  04/09/20   [provider]  ACCU-CHEK GUIDE test strip USE TO TEST TWICE DAILY.AL 07/05/20   [provider]  acetaminophen (TYLENOL) 325 MG tablet Take 2 tablets (650 mg total) by mouth every 6 (six) hours as needed for mild pain, fever or headache (or Fever >/= 101). 01/08/22   Shon Hale, MD  Amino Acids-Protein Hydrolys (FEEDING SUPPLEMENT, PRO-STAT SUGAR FREE 64,) LIQD Take 30 mLs by mouth in the morning, at noon, and at bedtime. (0600,0900 & 1600)    [provider]  amLODipine (NORVASC) 10 MG tablet Take 1 tablet (10 mg total) by mouth daily. 01/08/22 11/07/24  Shon Hale, MD  apixaban (ELIQUIS) 2.5  MG TABS tablet Take 1 tablet (2.5 mg total) by mouth 2 (two) times daily. 04/13/22 11/08/26  Kommor, Madison, MD  atorvastatin (LIPITOR) 40 MG tablet Take 40 mg by mouth at bedtime. (2100) 10/19/21   [provider]  Blood Glucose Monitoring Suppl (ACCU-CHEK GUIDE) w/Device KIT USE AS DIRECTED.I 09/03/20   [provider]  carvedilol (COREG) 3.125 MG tablet Take 1 tablet (3.125 mg total) by mouth 2 (two) times daily with a meal. 01/08/22 11/08/26  Shon Hale, MD  Cholecalciferol (VITAMIN D3 SUPER STRENGTH) 50 MCG (2000 UT) TABS Take 2,000 Units by mouth in the morning. (0900)    [provider]  fosfomycin (MONUROL) 3 g PACK Take 3 g by mouth every 3 (three) days. (0900)    [provider]  furosemide (LASIX) 40 MG tablet Take 1 tablet (40 mg total) by mouth daily. 01/08/22   Shon Hale, MD  gabapentin (NEURONTIN) 100 MG capsule Take 100 mg by mouth every evening. (2000)    [provider]  hydrALAZINE (APRESOLINE) 100 MG tablet Take 1 tablet (100 mg total) by mouth 3 (three) times daily. 10/22/18   Catarina Hartshorn, MD  insulin lispro (HUMALOG) 100 UNIT/ML injection Inject 4 Units into the skin with breakfast, with lunch, and with evening meal. Hold for FSBS less than 150 or if not eating 01/09/22   [provider]  loperamide (IMODIUM A-D) 2 MG tablet Take 2-4 mg by mouth as needed for diarrhea  or loose stools.    [provider]  MELATONIN PO Take 6 mg by mouth at bedtime. (2200)    [provider]  Multiple Vitamin (MULTIVITAMIN WITH MINERALS) TABS tablet Take 1 tablet by mouth in the morning. (0800)    [provider]  polyethylene glycol (MIRALAX / GLYCOLAX) 17 g packet Take 17 g by mouth 2 (two) times a week. Tuesdays & Saturdays (0900)    [provider]  senna-docusate (SENOKOT-S) 8.6-50 MG tablet Take 1 tablet by mouth daily. Patient taking differently: Take 1 tablet by mouth every evening. (1800) 04/15/22    Gerhard Munch, MD  sodium zirconium cyclosilicate (LOKELMA) 10 g PACK packet Take 10 g by mouth every Monday, Wednesday, and Friday. 12/26/21   Catarina Hartshorn, MD      Allergies    Patient has no known allergies.    Review of Systems   Review of Systems  Constitutional:  Positive for fatigue.  HENT:  Positive for ear pain.   Gastrointestinal:  Positive for abdominal pain, diarrhea and nausea.  Musculoskeletal:  Positive for myalgias.  Neurological:  Positive for headaches.  All other systems reviewed and are negative.   Physical Exam Updated Vital Signs BP 135/84   Pulse 89   Temp 98.2 F (36.8 C)   Resp 12   SpO2 96%  Physical Exam Vitals and nursing note reviewed.  Constitutional:      General: She is not in acute distress.    Appearance: Normal appearance. She is well-developed and normal weight. She is not ill-appearing, toxic-appearing or diaphoretic.  HENT:     Head: Normocephalic and atraumatic.     Right Ear: Tympanic membrane, ear canal and external ear normal.     Left Ear: External ear normal.     Nose: Nose normal.     Mouth/Throat:     Mouth: Mucous membranes are moist.  Eyes:     Extraocular Movements: Extraocular movements intact.     Conjunctiva/sclera: Conjunctivae normal.  Cardiovascular:     Rate and Rhythm: Tachycardia present. Rhythm irregular.     Heart sounds: No murmur heard. Pulmonary:     Effort: Pulmonary effort is normal. No respiratory distress.     Breath sounds: Normal breath sounds. No wheezing or rales.  Chest:     Chest wall: No tenderness.  Abdominal:     General: There is no distension.     Palpations: Abdomen is soft.     Tenderness: There is abdominal tenderness. There is no guarding or rebound.  Musculoskeletal:        General: No swelling. Normal range of motion.     Cervical back: Normal range of motion and neck supple.     Right lower leg: Edema present.     Left lower leg: Edema present.  Skin:    General: Skin is  warm and dry.     Coloration: Skin is not jaundiced or pale.  Neurological:     General: No focal deficit present.     Mental Status: She is alert and oriented to person, place, and time.  Psychiatric:        Mood and Affect: Mood normal.        Behavior: Behavior normal.     ED Results / Procedures / Treatments   Labs (all labs ordered are listed, but only abnormal results are displayed) Labs Reviewed  CBC WITH DIFFERENTIAL/PLATELET - Abnormal; Notable for the following components:      Result Value  WBC 19.0 (*)    RBC 3.36 (*)    Hemoglobin 10.3 (*)    HCT 31.8 (*)    RDW 16.1 (*)    Neutro Abs 17.3 (*)    All other components within normal limits  URINALYSIS, ROUTINE W REFLEX MICROSCOPIC - Abnormal; Notable for the following components:   Glucose, UA 150 (*)    Protein, ur 100 (*)    All other components within normal limits  BASIC METABOLIC PANEL - Abnormal; Notable for the following components:   Sodium 132 (*)    Chloride 95 (*)    Glucose, Bld 199 (*)    BUN 83 (*)    Creatinine, Ser 4.49 (*)    Calcium 8.7 (*)    GFR, Estimated 9 (*)    All other components within normal limits  HEPATIC FUNCTION PANEL - Abnormal; Notable for the following components:   Albumin 2.9 (*)    All other components within normal limits  TROPONIN I (HIGH SENSITIVITY) - Abnormal; Notable for the following components:   Troponin I (High Sensitivity) 18 (*)    All other components within normal limits  TROPONIN I (HIGH SENSITIVITY) - Abnormal; Notable for the following components:   Troponin I (High Sensitivity) 20 (*)    All other components within normal limits  C DIFFICILE QUICK SCREEN W PCR REFLEX    LIPASE, BLOOD  MAGNESIUM    EKG EKG Interpretation Date/Time:  Monday December 11 2022 12:21:19 EDT Ventricular Rate:  116 PR Interval:    QRS Duration:  105 QT Interval:  338 QTC Calculation: 470 R Axis:   -13  Text Interpretation: Atrial fibrillation LVH with secondary  repolarization abnormality Confirmed by Gloris Manchester [phone]) on 12/11/2022 12:38:20 PM  Radiology CT Head Wo Contrast  Result Date: 12/11/2022 CLINICAL DATA:  Headache, new onset. Generalized weakness with diarrhea beginning yesterday. EXAM: CT HEAD WITHOUT CONTRAST TECHNIQUE: Contiguous axial images were obtained from the base of the skull through the vertex without intravenous contrast. RADIATION DOSE REDUCTION: This exam was performed according to the departmental dose-optimization program which includes automated exposure control, adjustment of the mA and/or kV according to patient size and/or use of iterative reconstruction technique. COMPARISON:  02/22/2021 FINDINGS: Brain: No evidence of acute infarction, hemorrhage, hydrocephalus, extra-axial collection or mass lesion/mass effect. Chronic small vessel ischemic gliosis in the periventricular white matter with chronic lacunar infarct at the genu of the right internal capsule. Small superficial calcifications along the right cerebral convexity are stable. Mild for age cerebral volume loss Vascular: No hyperdense vessel or unexpected calcification. Skull: Normal. Negative for fracture or focal lesion. Sinuses/Orbits: No acute finding. IMPRESSION: Aging brain without acute or reversible finding. Electronically Signed   By: Tiburcio Pea M.D.   On: 12/11/2022 13:40   DG Chest Portable 1 View  Result Date: 12/11/2022 CLINICAL DATA:  arm pain EXAM: PORTABLE CHEST 1 VIEW COMPARISON:  CXR 11/09/22 FINDINGS: Left-sided tunneled central venous catheter with unchanged positioning. Cardiomegaly. No pleural effusion. No pneumothorax. No focal airspace opacity. No radiographically apparent displaced rib fractures. Visualized upper abdomen is unremarkable. IMPRESSION: No focal airspace opacity Electronically Signed   By: Lorenza Cambridge M.D.   On: 12/11/2022 12:49    Procedures Procedures    Medications Ordered in ED Medications  acetaminophen (TYLENOL) tablet  650 mg (650 mg Oral Given 12/11/22 1346)  carvedilol (COREG) tablet 3.125 mg (3.125 mg Oral Given 12/11/22 1346)    ED Course/ Medical Decision Making/ A&P  Medical Decision Making Amount and/or Complexity of Data Reviewed Labs: ordered. Radiology: ordered.  Risk OTC drugs. Prescription drug management.   This patient presents to the ED for concern of multiple complaints, this involves an extensive number of treatment options, and is a complaint that carries with it a high risk of complications and morbidity.  The differential diagnosis includes colitis, polypharmacy, ACS, ICH, migraine headache, metabolic derangements, other infection   Co morbidities that complicate the patient evaluation  DM, atrial fibrillation, ESRD, HTN, CHF, seizures   Additional history obtained:  Additional history obtained from N/A External records from outside source obtained and reviewed including EMR   Lab Tests:  I Ordered, and personally interpreted labs.  The pertinent results include: Leukocytosis is present.  Hemoglobin is decreased at baseline.  Creatinine and BUN are elevated consistent with ESRD.  Troponin is high-normal.   Imaging Studies ordered:  I ordered imaging studies including chest x-ray, CT head, CT of chest, abdomen, pelvis I independently visualized and interpreted imaging which showed no acute findings on x-ray or CT head.  CT of CAP pending at time of signout. I agree with the radiologist interpretation   Cardiac Monitoring: / EKG:  The patient was maintained on a cardiac monitor.  I personally viewed and interpreted the cardiac monitored which showed an underlying rhythm of: Sinus rhythm  Problem List / ED Course / Critical interventions / Medication management  Patient presenting for multiple complaints.  Among these are diarrhea since yesterday, abdominal discomfort since yesterday, headache, ear pain, and left arm pain starting  today.  Because her symptoms, her dialysis session today was cut short.  Patient denies any current nausea.  On exam, abdomen is soft she does endorse some generalized tenderness.  On otoscope inspection, left ear canal and TM are normal in appearance.  She has no focal neurologic deficits.  Medication was ordered for headache pain.  Workup was initiated.  Lab work is notable for leukocytosis.  X-ray and urinalysis did not show source of infection.  C. difficile studies were ordered.  CT of chest, abdomen, pelvis was ordered.  Care of patient was signed out to oncoming ED provider. I ordered medication including Tylenol for headache; Coreg for tachycardia Reevaluation of the patient after these medicines showed that the patient improved I have reviewed the patients home medicines and have made adjustments as needed   Social Determinants of Health:  Resides in nursing facility        Final Clinical Impression(s) / ED Diagnoses Final diagnoses:  Diarrhea, unspecified type    Rx / DC Orders ED Discharge Orders     None         Gloris Manchester, MD 12/11/22 [phone]

## 2022-12-11 NOTE — ED Provider Notes (Signed)
Signed out that labs and CT are resulted, and that patient may be d/c'd to home.  CT read as showing no acute process.   Pt alert, oriented. States overall feels fine. Denies fever, chills, sweats.  Pt with no episodes of diarrhea since in ED. Denies abd pain . No vomiting. Abd is soft and nontender. No dysuria. No chest pain or sob. Feels is breathing fine. Denies extremity pain, swelling, redness or lesions.  Vitals normal pt afebrile. Neck supple, no stiffness/rigidity. Chest cta. At  HD cath site, no pain, drainage, redness or other sign of infection. Abd soft nt. No focal extremity pain, no area of cellulitis or infection noted. Pt denies fever, chills, sweats.   Pt currently appears stable for d/c per Dr Darletta Moll plan.      Cathren Laine, MD 12/11/22 [phone]

## 2022-12-11 NOTE — ED Notes (Signed)
[facility] made aware pt needs transport back to [facility]. Family contacted with no answer.

## 2022-12-13 DIAGNOSIS — D72829 Elevated white blood cell count, unspecified: Secondary | ICD-10-CM | POA: Diagnosis not present

## 2022-12-13 DIAGNOSIS — N186 End stage renal disease: Secondary | ICD-10-CM | POA: Diagnosis not present

## 2022-12-13 DIAGNOSIS — R7881 Bacteremia: Secondary | ICD-10-CM | POA: Diagnosis not present

## 2022-12-13 DIAGNOSIS — B9689 Other specified bacterial agents as the cause of diseases classified elsewhere: Secondary | ICD-10-CM | POA: Diagnosis not present

## 2022-12-13 DIAGNOSIS — E43 Unspecified severe protein-calorie malnutrition: Secondary | ICD-10-CM | POA: Diagnosis not present

## 2022-12-13 DIAGNOSIS — Z992 Dependence on renal dialysis: Secondary | ICD-10-CM | POA: Diagnosis not present

## 2022-12-13 DIAGNOSIS — E8779 Other fluid overload: Secondary | ICD-10-CM | POA: Diagnosis not present

## 2022-12-15 DIAGNOSIS — B9689 Other specified bacterial agents as the cause of diseases classified elsewhere: Secondary | ICD-10-CM | POA: Diagnosis not present

## 2022-12-15 DIAGNOSIS — R7881 Bacteremia: Secondary | ICD-10-CM | POA: Diagnosis not present

## 2022-12-15 DIAGNOSIS — Z992 Dependence on renal dialysis: Secondary | ICD-10-CM | POA: Diagnosis not present

## 2022-12-15 DIAGNOSIS — E8779 Other fluid overload: Secondary | ICD-10-CM | POA: Diagnosis not present

## 2022-12-15 DIAGNOSIS — D72829 Elevated white blood cell count, unspecified: Secondary | ICD-10-CM | POA: Diagnosis not present

## 2022-12-15 DIAGNOSIS — E43 Unspecified severe protein-calorie malnutrition: Secondary | ICD-10-CM | POA: Diagnosis not present

## 2022-12-15 DIAGNOSIS — N186 End stage renal disease: Secondary | ICD-10-CM | POA: Diagnosis not present

## 2022-12-18 ENCOUNTER — Telehealth: Payer: Self-pay

## 2022-12-18 DIAGNOSIS — B9689 Other specified bacterial agents as the cause of diseases classified elsewhere: Secondary | ICD-10-CM | POA: Diagnosis not present

## 2022-12-18 DIAGNOSIS — Z992 Dependence on renal dialysis: Secondary | ICD-10-CM | POA: Diagnosis not present

## 2022-12-18 DIAGNOSIS — D72829 Elevated white blood cell count, unspecified: Secondary | ICD-10-CM | POA: Diagnosis not present

## 2022-12-18 DIAGNOSIS — E8779 Other fluid overload: Secondary | ICD-10-CM | POA: Diagnosis not present

## 2022-12-18 DIAGNOSIS — R7881 Bacteremia: Secondary | ICD-10-CM | POA: Diagnosis not present

## 2022-12-18 DIAGNOSIS — N186 End stage renal disease: Secondary | ICD-10-CM | POA: Diagnosis not present

## 2022-12-18 DIAGNOSIS — E43 Unspecified severe protein-calorie malnutrition: Secondary | ICD-10-CM | POA: Diagnosis not present

## 2022-12-18 NOTE — Telephone Encounter (Signed)
Transition Care Management Unsuccessful Follow-up Telephone Call  Date of discharge and from where:  [facility]  Attempts:  1st Attempt  Reason for unsuccessful TCM follow-up call:  No answer/busy   Lenard Forth [facility] Series Guide, [facility] [phone] [address] [place], [place], [place] [address] Phone: [phone] Email: [email or web address]

## 2022-12-19 ENCOUNTER — Telehealth: Payer: Self-pay

## 2022-12-19 NOTE — Telephone Encounter (Signed)
Transition Care Management Follow-up Telephone Call Date of discharge and from where: Chelsea [facility] 8/12 How have you been since you were released from the hospital? Doing fine and in a SNF and being treated by the providers in that facility Any questions or concerns? No  Items Reviewed: Did the pt receive and understand the discharge instructions provided? Yes  Medications obtained and verified? Yes  Other? No  Any new allergies since your discharge? No  Dietary orders reviewed? No Do you have support at home? Yes  SNF    Follow up appointments reviewed:  PCP Hospital f/u appt confirmed? Yes  Scheduled to see  on  @ . Specialist Hospital f/u appt confirmed? Yes  Scheduled to see  on  @ . Are transportation arrangements needed? No  If their condition worsens, is the pt aware to call PCP or go to the Emergency Dept.? Yes Was the patient provided with contact information for the PCP's office or ED? Yes Was to pt encouraged to call back with questions or concerns? Yes

## 2022-12-20 DIAGNOSIS — Z992 Dependence on renal dialysis: Secondary | ICD-10-CM | POA: Diagnosis not present

## 2022-12-20 DIAGNOSIS — E8779 Other fluid overload: Secondary | ICD-10-CM | POA: Diagnosis not present

## 2022-12-20 DIAGNOSIS — E43 Unspecified severe protein-calorie malnutrition: Secondary | ICD-10-CM | POA: Diagnosis not present

## 2022-12-20 DIAGNOSIS — R7881 Bacteremia: Secondary | ICD-10-CM | POA: Diagnosis not present

## 2022-12-20 DIAGNOSIS — D72829 Elevated white blood cell count, unspecified: Secondary | ICD-10-CM | POA: Diagnosis not present

## 2022-12-20 DIAGNOSIS — B9689 Other specified bacterial agents as the cause of diseases classified elsewhere: Secondary | ICD-10-CM | POA: Diagnosis not present

## 2022-12-20 DIAGNOSIS — N186 End stage renal disease: Secondary | ICD-10-CM | POA: Diagnosis not present

## 2022-12-21 DIAGNOSIS — Z79899 Other long term (current) drug therapy: Secondary | ICD-10-CM | POA: Diagnosis not present

## 2022-12-21 DIAGNOSIS — Z992 Dependence on renal dialysis: Secondary | ICD-10-CM | POA: Diagnosis not present

## 2022-12-21 DIAGNOSIS — N186 End stage renal disease: Secondary | ICD-10-CM | POA: Diagnosis not present

## 2022-12-21 DIAGNOSIS — E1122 Type 2 diabetes mellitus with diabetic chronic kidney disease: Secondary | ICD-10-CM | POA: Diagnosis not present

## 2022-12-21 DIAGNOSIS — I5032 Chronic diastolic (congestive) heart failure: Secondary | ICD-10-CM | POA: Diagnosis not present

## 2022-12-22 DIAGNOSIS — E8779 Other fluid overload: Secondary | ICD-10-CM | POA: Diagnosis not present

## 2022-12-22 DIAGNOSIS — E43 Unspecified severe protein-calorie malnutrition: Secondary | ICD-10-CM | POA: Diagnosis not present

## 2022-12-22 DIAGNOSIS — R7881 Bacteremia: Secondary | ICD-10-CM | POA: Diagnosis not present

## 2022-12-22 DIAGNOSIS — B9689 Other specified bacterial agents as the cause of diseases classified elsewhere: Secondary | ICD-10-CM | POA: Diagnosis not present

## 2022-12-22 DIAGNOSIS — N186 End stage renal disease: Secondary | ICD-10-CM | POA: Diagnosis not present

## 2022-12-22 DIAGNOSIS — D72829 Elevated white blood cell count, unspecified: Secondary | ICD-10-CM | POA: Diagnosis not present

## 2022-12-22 DIAGNOSIS — Z992 Dependence on renal dialysis: Secondary | ICD-10-CM | POA: Diagnosis not present

## 2022-12-25 DIAGNOSIS — R7881 Bacteremia: Secondary | ICD-10-CM | POA: Diagnosis not present

## 2022-12-25 DIAGNOSIS — Z992 Dependence on renal dialysis: Secondary | ICD-10-CM | POA: Diagnosis not present

## 2022-12-25 DIAGNOSIS — N186 End stage renal disease: Secondary | ICD-10-CM | POA: Diagnosis not present

## 2022-12-25 DIAGNOSIS — E43 Unspecified severe protein-calorie malnutrition: Secondary | ICD-10-CM | POA: Diagnosis not present

## 2022-12-25 DIAGNOSIS — D72829 Elevated white blood cell count, unspecified: Secondary | ICD-10-CM | POA: Diagnosis not present

## 2022-12-25 DIAGNOSIS — B9689 Other specified bacterial agents as the cause of diseases classified elsewhere: Secondary | ICD-10-CM | POA: Diagnosis not present

## 2022-12-25 DIAGNOSIS — E8779 Other fluid overload: Secondary | ICD-10-CM | POA: Diagnosis not present

## 2022-12-26 ENCOUNTER — Ambulatory Visit: Payer: 59 | Admitting: Obstetrics & Gynecology

## 2022-12-26 DIAGNOSIS — I132 Hypertensive heart and chronic kidney disease with heart failure and with stage 5 chronic kidney disease, or end stage renal disease: Secondary | ICD-10-CM | POA: Diagnosis not present

## 2022-12-26 DIAGNOSIS — M6281 Muscle weakness (generalized): Secondary | ICD-10-CM | POA: Diagnosis not present

## 2022-12-27 DIAGNOSIS — N186 End stage renal disease: Secondary | ICD-10-CM | POA: Diagnosis not present

## 2022-12-27 DIAGNOSIS — E43 Unspecified severe protein-calorie malnutrition: Secondary | ICD-10-CM | POA: Diagnosis not present

## 2022-12-28 DIAGNOSIS — R7881 Bacteremia: Secondary | ICD-10-CM | POA: Diagnosis not present

## 2022-12-28 DIAGNOSIS — Z992 Dependence on renal dialysis: Secondary | ICD-10-CM | POA: Diagnosis not present

## 2022-12-28 DIAGNOSIS — N186 End stage renal disease: Secondary | ICD-10-CM | POA: Diagnosis not present

## 2022-12-28 DIAGNOSIS — D72829 Elevated white blood cell count, unspecified: Secondary | ICD-10-CM | POA: Diagnosis not present

## 2022-12-28 DIAGNOSIS — B9689 Other specified bacterial agents as the cause of diseases classified elsewhere: Secondary | ICD-10-CM | POA: Diagnosis not present

## 2022-12-28 DIAGNOSIS — E8779 Other fluid overload: Secondary | ICD-10-CM | POA: Diagnosis not present

## 2022-12-29 DIAGNOSIS — Z992 Dependence on renal dialysis: Secondary | ICD-10-CM | POA: Diagnosis not present

## 2022-12-29 DIAGNOSIS — R7881 Bacteremia: Secondary | ICD-10-CM | POA: Diagnosis not present

## 2022-12-29 DIAGNOSIS — B9689 Other specified bacterial agents as the cause of diseases classified elsewhere: Secondary | ICD-10-CM | POA: Diagnosis not present

## 2022-12-29 DIAGNOSIS — E43 Unspecified severe protein-calorie malnutrition: Secondary | ICD-10-CM | POA: Diagnosis not present

## 2022-12-29 DIAGNOSIS — N186 End stage renal disease: Secondary | ICD-10-CM | POA: Diagnosis not present

## 2022-12-29 DIAGNOSIS — E8779 Other fluid overload: Secondary | ICD-10-CM | POA: Diagnosis not present

## 2022-12-29 DIAGNOSIS — D72829 Elevated white blood cell count, unspecified: Secondary | ICD-10-CM | POA: Diagnosis not present

## 2022-12-30 DIAGNOSIS — Z992 Dependence on renal dialysis: Secondary | ICD-10-CM | POA: Diagnosis not present

## 2022-12-30 DIAGNOSIS — N186 End stage renal disease: Secondary | ICD-10-CM | POA: Diagnosis not present

## 2023-01-01 DIAGNOSIS — E43 Unspecified severe protein-calorie malnutrition: Secondary | ICD-10-CM | POA: Diagnosis not present

## 2023-01-01 DIAGNOSIS — Z992 Dependence on renal dialysis: Secondary | ICD-10-CM | POA: Diagnosis not present

## 2023-01-01 DIAGNOSIS — N186 End stage renal disease: Secondary | ICD-10-CM | POA: Diagnosis not present

## 2023-01-03 DIAGNOSIS — E43 Unspecified severe protein-calorie malnutrition: Secondary | ICD-10-CM | POA: Diagnosis not present

## 2023-01-03 DIAGNOSIS — N186 End stage renal disease: Secondary | ICD-10-CM | POA: Diagnosis not present

## 2023-01-04 DIAGNOSIS — Z992 Dependence on renal dialysis: Secondary | ICD-10-CM | POA: Diagnosis not present

## 2023-01-04 DIAGNOSIS — N186 End stage renal disease: Secondary | ICD-10-CM | POA: Diagnosis not present

## 2023-01-05 DIAGNOSIS — E43 Unspecified severe protein-calorie malnutrition: Secondary | ICD-10-CM | POA: Diagnosis not present

## 2023-01-05 DIAGNOSIS — N186 End stage renal disease: Secondary | ICD-10-CM | POA: Diagnosis not present

## 2023-01-05 DIAGNOSIS — Z992 Dependence on renal dialysis: Secondary | ICD-10-CM | POA: Diagnosis not present

## 2023-01-08 DIAGNOSIS — Z992 Dependence on renal dialysis: Secondary | ICD-10-CM | POA: Diagnosis not present

## 2023-01-08 DIAGNOSIS — N186 End stage renal disease: Secondary | ICD-10-CM | POA: Diagnosis not present

## 2023-01-08 DIAGNOSIS — E43 Unspecified severe protein-calorie malnutrition: Secondary | ICD-10-CM | POA: Diagnosis not present

## 2023-01-10 DIAGNOSIS — Z992 Dependence on renal dialysis: Secondary | ICD-10-CM | POA: Diagnosis not present

## 2023-01-10 DIAGNOSIS — N186 End stage renal disease: Secondary | ICD-10-CM | POA: Diagnosis not present

## 2023-01-10 DIAGNOSIS — E43 Unspecified severe protein-calorie malnutrition: Secondary | ICD-10-CM | POA: Diagnosis not present

## 2023-01-11 DIAGNOSIS — R262 Difficulty in walking, not elsewhere classified: Secondary | ICD-10-CM | POA: Diagnosis not present

## 2023-01-11 DIAGNOSIS — M6281 Muscle weakness (generalized): Secondary | ICD-10-CM | POA: Diagnosis not present

## 2023-01-11 DIAGNOSIS — B351 Tinea unguium: Secondary | ICD-10-CM | POA: Diagnosis not present

## 2023-01-11 DIAGNOSIS — I132 Hypertensive heart and chronic kidney disease with heart failure and with stage 5 chronic kidney disease, or end stage renal disease: Secondary | ICD-10-CM | POA: Diagnosis not present

## 2023-01-12 DIAGNOSIS — E43 Unspecified severe protein-calorie malnutrition: Secondary | ICD-10-CM | POA: Diagnosis not present

## 2023-01-12 DIAGNOSIS — M6281 Muscle weakness (generalized): Secondary | ICD-10-CM | POA: Diagnosis not present

## 2023-01-12 DIAGNOSIS — N186 End stage renal disease: Secondary | ICD-10-CM | POA: Diagnosis not present

## 2023-01-12 DIAGNOSIS — I132 Hypertensive heart and chronic kidney disease with heart failure and with stage 5 chronic kidney disease, or end stage renal disease: Secondary | ICD-10-CM | POA: Diagnosis not present

## 2023-01-12 DIAGNOSIS — R262 Difficulty in walking, not elsewhere classified: Secondary | ICD-10-CM | POA: Diagnosis not present

## 2023-01-12 DIAGNOSIS — Z992 Dependence on renal dialysis: Secondary | ICD-10-CM | POA: Diagnosis not present

## 2023-01-14 DIAGNOSIS — I132 Hypertensive heart and chronic kidney disease with heart failure and with stage 5 chronic kidney disease, or end stage renal disease: Secondary | ICD-10-CM | POA: Diagnosis not present

## 2023-01-14 DIAGNOSIS — M6281 Muscle weakness (generalized): Secondary | ICD-10-CM | POA: Diagnosis not present

## 2023-01-14 DIAGNOSIS — R262 Difficulty in walking, not elsewhere classified: Secondary | ICD-10-CM | POA: Diagnosis not present

## 2023-01-15 DIAGNOSIS — M6281 Muscle weakness (generalized): Secondary | ICD-10-CM | POA: Diagnosis not present

## 2023-01-15 DIAGNOSIS — E43 Unspecified severe protein-calorie malnutrition: Secondary | ICD-10-CM | POA: Diagnosis not present

## 2023-01-15 DIAGNOSIS — Z992 Dependence on renal dialysis: Secondary | ICD-10-CM | POA: Diagnosis not present

## 2023-01-15 DIAGNOSIS — I132 Hypertensive heart and chronic kidney disease with heart failure and with stage 5 chronic kidney disease, or end stage renal disease: Secondary | ICD-10-CM | POA: Diagnosis not present

## 2023-01-15 DIAGNOSIS — N186 End stage renal disease: Secondary | ICD-10-CM | POA: Diagnosis not present

## 2023-01-15 DIAGNOSIS — R262 Difficulty in walking, not elsewhere classified: Secondary | ICD-10-CM | POA: Diagnosis not present

## 2023-01-16 DIAGNOSIS — I132 Hypertensive heart and chronic kidney disease with heart failure and with stage 5 chronic kidney disease, or end stage renal disease: Secondary | ICD-10-CM | POA: Diagnosis not present

## 2023-01-16 DIAGNOSIS — M6281 Muscle weakness (generalized): Secondary | ICD-10-CM | POA: Diagnosis not present

## 2023-01-16 DIAGNOSIS — R262 Difficulty in walking, not elsewhere classified: Secondary | ICD-10-CM | POA: Diagnosis not present

## 2023-01-17 DIAGNOSIS — E43 Unspecified severe protein-calorie malnutrition: Secondary | ICD-10-CM | POA: Diagnosis not present

## 2023-01-17 DIAGNOSIS — N186 End stage renal disease: Secondary | ICD-10-CM | POA: Diagnosis not present

## 2023-01-18 DIAGNOSIS — Z992 Dependence on renal dialysis: Secondary | ICD-10-CM | POA: Diagnosis not present

## 2023-01-18 DIAGNOSIS — R262 Difficulty in walking, not elsewhere classified: Secondary | ICD-10-CM | POA: Diagnosis not present

## 2023-01-18 DIAGNOSIS — M6281 Muscle weakness (generalized): Secondary | ICD-10-CM | POA: Diagnosis not present

## 2023-01-18 DIAGNOSIS — I132 Hypertensive heart and chronic kidney disease with heart failure and with stage 5 chronic kidney disease, or end stage renal disease: Secondary | ICD-10-CM | POA: Diagnosis not present

## 2023-01-18 DIAGNOSIS — N186 End stage renal disease: Secondary | ICD-10-CM | POA: Diagnosis not present

## 2023-01-19 DIAGNOSIS — N186 End stage renal disease: Secondary | ICD-10-CM | POA: Diagnosis not present

## 2023-01-19 DIAGNOSIS — E43 Unspecified severe protein-calorie malnutrition: Secondary | ICD-10-CM | POA: Diagnosis not present

## 2023-01-19 DIAGNOSIS — Z992 Dependence on renal dialysis: Secondary | ICD-10-CM | POA: Diagnosis not present

## 2023-01-20 DIAGNOSIS — R262 Difficulty in walking, not elsewhere classified: Secondary | ICD-10-CM | POA: Diagnosis not present

## 2023-01-20 DIAGNOSIS — I132 Hypertensive heart and chronic kidney disease with heart failure and with stage 5 chronic kidney disease, or end stage renal disease: Secondary | ICD-10-CM | POA: Diagnosis not present

## 2023-01-20 DIAGNOSIS — M6281 Muscle weakness (generalized): Secondary | ICD-10-CM | POA: Diagnosis not present

## 2023-01-22 DIAGNOSIS — E43 Unspecified severe protein-calorie malnutrition: Secondary | ICD-10-CM | POA: Diagnosis not present

## 2023-01-22 DIAGNOSIS — R262 Difficulty in walking, not elsewhere classified: Secondary | ICD-10-CM | POA: Diagnosis not present

## 2023-01-22 DIAGNOSIS — M6281 Muscle weakness (generalized): Secondary | ICD-10-CM | POA: Diagnosis not present

## 2023-01-22 DIAGNOSIS — I132 Hypertensive heart and chronic kidney disease with heart failure and with stage 5 chronic kidney disease, or end stage renal disease: Secondary | ICD-10-CM | POA: Diagnosis not present

## 2023-01-22 DIAGNOSIS — N186 End stage renal disease: Secondary | ICD-10-CM | POA: Diagnosis not present

## 2023-01-23 DIAGNOSIS — I132 Hypertensive heart and chronic kidney disease with heart failure and with stage 5 chronic kidney disease, or end stage renal disease: Secondary | ICD-10-CM | POA: Diagnosis not present

## 2023-01-23 DIAGNOSIS — M6281 Muscle weakness (generalized): Secondary | ICD-10-CM | POA: Diagnosis not present

## 2023-01-23 DIAGNOSIS — Z992 Dependence on renal dialysis: Secondary | ICD-10-CM | POA: Diagnosis not present

## 2023-01-23 DIAGNOSIS — R262 Difficulty in walking, not elsewhere classified: Secondary | ICD-10-CM | POA: Diagnosis not present

## 2023-01-23 DIAGNOSIS — N186 End stage renal disease: Secondary | ICD-10-CM | POA: Diagnosis not present

## 2023-01-24 DIAGNOSIS — N186 End stage renal disease: Secondary | ICD-10-CM | POA: Diagnosis not present

## 2023-01-24 DIAGNOSIS — M6281 Muscle weakness (generalized): Secondary | ICD-10-CM | POA: Diagnosis not present

## 2023-01-24 DIAGNOSIS — Z992 Dependence on renal dialysis: Secondary | ICD-10-CM | POA: Diagnosis not present

## 2023-01-24 DIAGNOSIS — I132 Hypertensive heart and chronic kidney disease with heart failure and with stage 5 chronic kidney disease, or end stage renal disease: Secondary | ICD-10-CM | POA: Diagnosis not present

## 2023-01-24 DIAGNOSIS — E43 Unspecified severe protein-calorie malnutrition: Secondary | ICD-10-CM | POA: Diagnosis not present

## 2023-01-24 DIAGNOSIS — R262 Difficulty in walking, not elsewhere classified: Secondary | ICD-10-CM | POA: Diagnosis not present

## 2023-01-25 DIAGNOSIS — R262 Difficulty in walking, not elsewhere classified: Secondary | ICD-10-CM | POA: Diagnosis not present

## 2023-01-25 DIAGNOSIS — I132 Hypertensive heart and chronic kidney disease with heart failure and with stage 5 chronic kidney disease, or end stage renal disease: Secondary | ICD-10-CM | POA: Diagnosis not present

## 2023-01-25 DIAGNOSIS — M6281 Muscle weakness (generalized): Secondary | ICD-10-CM | POA: Diagnosis not present

## 2023-01-26 DIAGNOSIS — I132 Hypertensive heart and chronic kidney disease with heart failure and with stage 5 chronic kidney disease, or end stage renal disease: Secondary | ICD-10-CM | POA: Diagnosis not present

## 2023-01-26 DIAGNOSIS — E43 Unspecified severe protein-calorie malnutrition: Secondary | ICD-10-CM | POA: Diagnosis not present

## 2023-01-26 DIAGNOSIS — R262 Difficulty in walking, not elsewhere classified: Secondary | ICD-10-CM | POA: Diagnosis not present

## 2023-01-26 DIAGNOSIS — M6281 Muscle weakness (generalized): Secondary | ICD-10-CM | POA: Diagnosis not present

## 2023-01-26 DIAGNOSIS — N186 End stage renal disease: Secondary | ICD-10-CM | POA: Diagnosis not present

## 2023-01-27 DIAGNOSIS — Z992 Dependence on renal dialysis: Secondary | ICD-10-CM | POA: Diagnosis not present

## 2023-01-27 DIAGNOSIS — N186 End stage renal disease: Secondary | ICD-10-CM | POA: Diagnosis not present

## 2023-01-29 DIAGNOSIS — Z992 Dependence on renal dialysis: Secondary | ICD-10-CM | POA: Diagnosis not present

## 2023-01-29 DIAGNOSIS — N186 End stage renal disease: Secondary | ICD-10-CM | POA: Diagnosis not present

## 2023-01-29 DIAGNOSIS — E43 Unspecified severe protein-calorie malnutrition: Secondary | ICD-10-CM | POA: Diagnosis not present

## 2023-01-30 DIAGNOSIS — M6281 Muscle weakness (generalized): Secondary | ICD-10-CM | POA: Diagnosis not present

## 2023-01-30 DIAGNOSIS — R262 Difficulty in walking, not elsewhere classified: Secondary | ICD-10-CM | POA: Diagnosis not present

## 2023-01-30 DIAGNOSIS — I132 Hypertensive heart and chronic kidney disease with heart failure and with stage 5 chronic kidney disease, or end stage renal disease: Secondary | ICD-10-CM | POA: Diagnosis not present

## 2023-01-31 DIAGNOSIS — M6281 Muscle weakness (generalized): Secondary | ICD-10-CM | POA: Diagnosis not present

## 2023-01-31 DIAGNOSIS — N186 End stage renal disease: Secondary | ICD-10-CM | POA: Diagnosis not present

## 2023-01-31 DIAGNOSIS — Z992 Dependence on renal dialysis: Secondary | ICD-10-CM | POA: Diagnosis not present

## 2023-01-31 DIAGNOSIS — I132 Hypertensive heart and chronic kidney disease with heart failure and with stage 5 chronic kidney disease, or end stage renal disease: Secondary | ICD-10-CM | POA: Diagnosis not present

## 2023-01-31 DIAGNOSIS — E43 Unspecified severe protein-calorie malnutrition: Secondary | ICD-10-CM | POA: Diagnosis not present

## 2023-01-31 DIAGNOSIS — R262 Difficulty in walking, not elsewhere classified: Secondary | ICD-10-CM | POA: Diagnosis not present

## 2023-02-01 DIAGNOSIS — M6281 Muscle weakness (generalized): Secondary | ICD-10-CM | POA: Diagnosis not present

## 2023-02-01 DIAGNOSIS — R262 Difficulty in walking, not elsewhere classified: Secondary | ICD-10-CM | POA: Diagnosis not present

## 2023-02-01 DIAGNOSIS — I132 Hypertensive heart and chronic kidney disease with heart failure and with stage 5 chronic kidney disease, or end stage renal disease: Secondary | ICD-10-CM | POA: Diagnosis not present

## 2023-02-02 DIAGNOSIS — I132 Hypertensive heart and chronic kidney disease with heart failure and with stage 5 chronic kidney disease, or end stage renal disease: Secondary | ICD-10-CM | POA: Diagnosis not present

## 2023-02-02 DIAGNOSIS — M6281 Muscle weakness (generalized): Secondary | ICD-10-CM | POA: Diagnosis not present

## 2023-02-02 DIAGNOSIS — N186 End stage renal disease: Secondary | ICD-10-CM | POA: Diagnosis not present

## 2023-02-02 DIAGNOSIS — R262 Difficulty in walking, not elsewhere classified: Secondary | ICD-10-CM | POA: Diagnosis not present

## 2023-02-02 DIAGNOSIS — E43 Unspecified severe protein-calorie malnutrition: Secondary | ICD-10-CM | POA: Diagnosis not present

## 2023-02-03 DIAGNOSIS — Z992 Dependence on renal dialysis: Secondary | ICD-10-CM | POA: Diagnosis not present

## 2023-02-03 DIAGNOSIS — N186 End stage renal disease: Secondary | ICD-10-CM | POA: Diagnosis not present

## 2023-02-05 DIAGNOSIS — E43 Unspecified severe protein-calorie malnutrition: Secondary | ICD-10-CM | POA: Diagnosis not present

## 2023-02-05 DIAGNOSIS — Z992 Dependence on renal dialysis: Secondary | ICD-10-CM | POA: Diagnosis not present

## 2023-02-05 DIAGNOSIS — N186 End stage renal disease: Secondary | ICD-10-CM | POA: Diagnosis not present

## 2023-02-06 DIAGNOSIS — N186 End stage renal disease: Secondary | ICD-10-CM | POA: Diagnosis not present

## 2023-02-06 DIAGNOSIS — I132 Hypertensive heart and chronic kidney disease with heart failure and with stage 5 chronic kidney disease, or end stage renal disease: Secondary | ICD-10-CM | POA: Diagnosis not present

## 2023-02-06 DIAGNOSIS — R262 Difficulty in walking, not elsewhere classified: Secondary | ICD-10-CM | POA: Diagnosis not present

## 2023-02-06 DIAGNOSIS — M6281 Muscle weakness (generalized): Secondary | ICD-10-CM | POA: Diagnosis not present

## 2023-02-06 DIAGNOSIS — D631 Anemia in chronic kidney disease: Secondary | ICD-10-CM | POA: Diagnosis not present

## 2023-02-07 DIAGNOSIS — E43 Unspecified severe protein-calorie malnutrition: Secondary | ICD-10-CM | POA: Diagnosis not present

## 2023-02-07 DIAGNOSIS — N186 End stage renal disease: Secondary | ICD-10-CM | POA: Diagnosis not present

## 2023-02-07 DIAGNOSIS — Z992 Dependence on renal dialysis: Secondary | ICD-10-CM | POA: Diagnosis not present

## 2023-02-08 DIAGNOSIS — R262 Difficulty in walking, not elsewhere classified: Secondary | ICD-10-CM | POA: Diagnosis not present

## 2023-02-08 DIAGNOSIS — I132 Hypertensive heart and chronic kidney disease with heart failure and with stage 5 chronic kidney disease, or end stage renal disease: Secondary | ICD-10-CM | POA: Diagnosis not present

## 2023-02-08 DIAGNOSIS — M6281 Muscle weakness (generalized): Secondary | ICD-10-CM | POA: Diagnosis not present

## 2023-02-09 DIAGNOSIS — E43 Unspecified severe protein-calorie malnutrition: Secondary | ICD-10-CM | POA: Diagnosis not present

## 2023-02-09 DIAGNOSIS — N186 End stage renal disease: Secondary | ICD-10-CM | POA: Diagnosis not present

## 2023-02-10 DIAGNOSIS — N186 End stage renal disease: Secondary | ICD-10-CM | POA: Diagnosis not present

## 2023-02-10 DIAGNOSIS — Z992 Dependence on renal dialysis: Secondary | ICD-10-CM | POA: Diagnosis not present

## 2023-02-12 DIAGNOSIS — Z992 Dependence on renal dialysis: Secondary | ICD-10-CM | POA: Diagnosis not present

## 2023-02-12 DIAGNOSIS — N186 End stage renal disease: Secondary | ICD-10-CM | POA: Diagnosis not present

## 2023-02-12 DIAGNOSIS — E119 Type 2 diabetes mellitus without complications: Secondary | ICD-10-CM | POA: Diagnosis not present

## 2023-02-12 DIAGNOSIS — E43 Unspecified severe protein-calorie malnutrition: Secondary | ICD-10-CM | POA: Diagnosis not present

## 2023-02-13 DIAGNOSIS — R262 Difficulty in walking, not elsewhere classified: Secondary | ICD-10-CM | POA: Diagnosis not present

## 2023-02-13 DIAGNOSIS — I132 Hypertensive heart and chronic kidney disease with heart failure and with stage 5 chronic kidney disease, or end stage renal disease: Secondary | ICD-10-CM | POA: Diagnosis not present

## 2023-02-13 DIAGNOSIS — M6281 Muscle weakness (generalized): Secondary | ICD-10-CM | POA: Diagnosis not present

## 2023-02-14 DIAGNOSIS — E1122 Type 2 diabetes mellitus with diabetic chronic kidney disease: Secondary | ICD-10-CM | POA: Diagnosis not present

## 2023-02-14 DIAGNOSIS — Z992 Dependence on renal dialysis: Secondary | ICD-10-CM | POA: Diagnosis not present

## 2023-02-14 DIAGNOSIS — I4891 Unspecified atrial fibrillation: Secondary | ICD-10-CM | POA: Diagnosis not present

## 2023-02-14 DIAGNOSIS — E785 Hyperlipidemia, unspecified: Secondary | ICD-10-CM | POA: Diagnosis not present

## 2023-02-14 DIAGNOSIS — E1142 Type 2 diabetes mellitus with diabetic polyneuropathy: Secondary | ICD-10-CM | POA: Diagnosis not present

## 2023-02-14 DIAGNOSIS — E43 Unspecified severe protein-calorie malnutrition: Secondary | ICD-10-CM | POA: Diagnosis not present

## 2023-02-14 DIAGNOSIS — I130 Hypertensive heart and chronic kidney disease with heart failure and stage 1 through stage 4 chronic kidney disease, or unspecified chronic kidney disease: Secondary | ICD-10-CM | POA: Diagnosis not present

## 2023-02-14 DIAGNOSIS — E559 Vitamin D deficiency, unspecified: Secondary | ICD-10-CM | POA: Diagnosis not present

## 2023-02-14 DIAGNOSIS — D631 Anemia in chronic kidney disease: Secondary | ICD-10-CM | POA: Diagnosis not present

## 2023-02-14 DIAGNOSIS — I5032 Chronic diastolic (congestive) heart failure: Secondary | ICD-10-CM | POA: Diagnosis not present

## 2023-02-14 DIAGNOSIS — N186 End stage renal disease: Secondary | ICD-10-CM | POA: Diagnosis not present

## 2023-02-15 DIAGNOSIS — Z79899 Other long term (current) drug therapy: Secondary | ICD-10-CM | POA: Diagnosis not present

## 2023-02-15 DIAGNOSIS — K5900 Constipation, unspecified: Secondary | ICD-10-CM | POA: Diagnosis not present

## 2023-02-15 DIAGNOSIS — M6281 Muscle weakness (generalized): Secondary | ICD-10-CM | POA: Diagnosis not present

## 2023-02-15 DIAGNOSIS — Z992 Dependence on renal dialysis: Secondary | ICD-10-CM | POA: Diagnosis not present

## 2023-02-15 DIAGNOSIS — I132 Hypertensive heart and chronic kidney disease with heart failure and with stage 5 chronic kidney disease, or end stage renal disease: Secondary | ICD-10-CM | POA: Diagnosis not present

## 2023-02-15 DIAGNOSIS — N186 End stage renal disease: Secondary | ICD-10-CM | POA: Diagnosis not present

## 2023-02-15 DIAGNOSIS — R2689 Other abnormalities of gait and mobility: Secondary | ICD-10-CM | POA: Diagnosis not present

## 2023-02-15 DIAGNOSIS — R262 Difficulty in walking, not elsewhere classified: Secondary | ICD-10-CM | POA: Diagnosis not present

## 2023-02-16 DIAGNOSIS — Z992 Dependence on renal dialysis: Secondary | ICD-10-CM | POA: Diagnosis not present

## 2023-02-16 DIAGNOSIS — N186 End stage renal disease: Secondary | ICD-10-CM | POA: Diagnosis not present

## 2023-02-16 DIAGNOSIS — E43 Unspecified severe protein-calorie malnutrition: Secondary | ICD-10-CM | POA: Diagnosis not present

## 2023-02-19 DIAGNOSIS — N186 End stage renal disease: Secondary | ICD-10-CM | POA: Diagnosis not present

## 2023-02-19 DIAGNOSIS — E43 Unspecified severe protein-calorie malnutrition: Secondary | ICD-10-CM | POA: Diagnosis not present

## 2023-02-19 DIAGNOSIS — Z992 Dependence on renal dialysis: Secondary | ICD-10-CM | POA: Diagnosis not present

## 2023-02-21 DIAGNOSIS — E43 Unspecified severe protein-calorie malnutrition: Secondary | ICD-10-CM | POA: Diagnosis not present

## 2023-02-21 DIAGNOSIS — N186 End stage renal disease: Secondary | ICD-10-CM | POA: Diagnosis not present

## 2023-02-22 DIAGNOSIS — Z992 Dependence on renal dialysis: Secondary | ICD-10-CM | POA: Diagnosis not present

## 2023-02-22 DIAGNOSIS — N186 End stage renal disease: Secondary | ICD-10-CM | POA: Diagnosis not present

## 2023-02-23 DIAGNOSIS — N186 End stage renal disease: Secondary | ICD-10-CM | POA: Diagnosis not present

## 2023-02-23 DIAGNOSIS — E43 Unspecified severe protein-calorie malnutrition: Secondary | ICD-10-CM | POA: Diagnosis not present

## 2023-02-24 DIAGNOSIS — Z992 Dependence on renal dialysis: Secondary | ICD-10-CM | POA: Diagnosis not present

## 2023-02-24 DIAGNOSIS — N186 End stage renal disease: Secondary | ICD-10-CM | POA: Diagnosis not present

## 2023-02-26 DIAGNOSIS — Z992 Dependence on renal dialysis: Secondary | ICD-10-CM | POA: Diagnosis not present

## 2023-02-26 DIAGNOSIS — E43 Unspecified severe protein-calorie malnutrition: Secondary | ICD-10-CM | POA: Diagnosis not present

## 2023-02-26 DIAGNOSIS — N186 End stage renal disease: Secondary | ICD-10-CM | POA: Diagnosis not present

## 2023-02-27 DIAGNOSIS — I132 Hypertensive heart and chronic kidney disease with heart failure and with stage 5 chronic kidney disease, or end stage renal disease: Secondary | ICD-10-CM | POA: Diagnosis not present

## 2023-02-27 DIAGNOSIS — I130 Hypertensive heart and chronic kidney disease with heart failure and stage 1 through stage 4 chronic kidney disease, or unspecified chronic kidney disease: Secondary | ICD-10-CM | POA: Diagnosis not present

## 2023-02-27 DIAGNOSIS — I5032 Chronic diastolic (congestive) heart failure: Secondary | ICD-10-CM | POA: Diagnosis not present

## 2023-02-27 DIAGNOSIS — Z992 Dependence on renal dialysis: Secondary | ICD-10-CM | POA: Diagnosis not present

## 2023-02-27 DIAGNOSIS — R262 Difficulty in walking, not elsewhere classified: Secondary | ICD-10-CM | POA: Diagnosis not present

## 2023-02-27 DIAGNOSIS — N186 End stage renal disease: Secondary | ICD-10-CM | POA: Diagnosis not present

## 2023-02-27 DIAGNOSIS — Z79899 Other long term (current) drug therapy: Secondary | ICD-10-CM | POA: Diagnosis not present

## 2023-02-27 DIAGNOSIS — E1122 Type 2 diabetes mellitus with diabetic chronic kidney disease: Secondary | ICD-10-CM | POA: Diagnosis not present

## 2023-02-27 DIAGNOSIS — M6281 Muscle weakness (generalized): Secondary | ICD-10-CM | POA: Diagnosis not present

## 2023-02-27 DIAGNOSIS — R2689 Other abnormalities of gait and mobility: Secondary | ICD-10-CM | POA: Diagnosis not present

## 2023-02-27 DIAGNOSIS — Z7189 Other specified counseling: Secondary | ICD-10-CM | POA: Diagnosis not present

## 2023-02-28 DIAGNOSIS — Z992 Dependence on renal dialysis: Secondary | ICD-10-CM | POA: Diagnosis not present

## 2023-02-28 DIAGNOSIS — E43 Unspecified severe protein-calorie malnutrition: Secondary | ICD-10-CM | POA: Diagnosis not present

## 2023-02-28 DIAGNOSIS — N186 End stage renal disease: Secondary | ICD-10-CM | POA: Diagnosis not present

## 2023-03-01 DIAGNOSIS — N186 End stage renal disease: Secondary | ICD-10-CM | POA: Diagnosis not present

## 2023-03-01 DIAGNOSIS — M6281 Muscle weakness (generalized): Secondary | ICD-10-CM | POA: Diagnosis not present

## 2023-03-01 DIAGNOSIS — Z992 Dependence on renal dialysis: Secondary | ICD-10-CM | POA: Diagnosis not present

## 2023-03-01 DIAGNOSIS — R262 Difficulty in walking, not elsewhere classified: Secondary | ICD-10-CM | POA: Diagnosis not present

## 2023-03-01 DIAGNOSIS — R2689 Other abnormalities of gait and mobility: Secondary | ICD-10-CM | POA: Diagnosis not present

## 2023-03-01 DIAGNOSIS — I132 Hypertensive heart and chronic kidney disease with heart failure and with stage 5 chronic kidney disease, or end stage renal disease: Secondary | ICD-10-CM | POA: Diagnosis not present

## 2023-03-02 DIAGNOSIS — E43 Unspecified severe protein-calorie malnutrition: Secondary | ICD-10-CM | POA: Diagnosis not present

## 2023-03-02 DIAGNOSIS — Z992 Dependence on renal dialysis: Secondary | ICD-10-CM | POA: Diagnosis not present

## 2023-03-02 DIAGNOSIS — N186 End stage renal disease: Secondary | ICD-10-CM | POA: Diagnosis not present

## 2023-03-04 DIAGNOSIS — I132 Hypertensive heart and chronic kidney disease with heart failure and with stage 5 chronic kidney disease, or end stage renal disease: Secondary | ICD-10-CM | POA: Diagnosis not present

## 2023-03-04 DIAGNOSIS — M6281 Muscle weakness (generalized): Secondary | ICD-10-CM | POA: Diagnosis not present

## 2023-03-04 DIAGNOSIS — R262 Difficulty in walking, not elsewhere classified: Secondary | ICD-10-CM | POA: Diagnosis not present

## 2023-03-04 DIAGNOSIS — R2689 Other abnormalities of gait and mobility: Secondary | ICD-10-CM | POA: Diagnosis not present

## 2023-03-05 DIAGNOSIS — Z992 Dependence on renal dialysis: Secondary | ICD-10-CM | POA: Diagnosis not present

## 2023-03-05 DIAGNOSIS — E43 Unspecified severe protein-calorie malnutrition: Secondary | ICD-10-CM | POA: Diagnosis not present

## 2023-03-05 DIAGNOSIS — N186 End stage renal disease: Secondary | ICD-10-CM | POA: Diagnosis not present

## 2023-03-06 DIAGNOSIS — R2689 Other abnormalities of gait and mobility: Secondary | ICD-10-CM | POA: Diagnosis not present

## 2023-03-06 DIAGNOSIS — I132 Hypertensive heart and chronic kidney disease with heart failure and with stage 5 chronic kidney disease, or end stage renal disease: Secondary | ICD-10-CM | POA: Diagnosis not present

## 2023-03-06 DIAGNOSIS — M6281 Muscle weakness (generalized): Secondary | ICD-10-CM | POA: Diagnosis not present

## 2023-03-06 DIAGNOSIS — R262 Difficulty in walking, not elsewhere classified: Secondary | ICD-10-CM | POA: Diagnosis not present

## 2023-03-07 DIAGNOSIS — I132 Hypertensive heart and chronic kidney disease with heart failure and with stage 5 chronic kidney disease, or end stage renal disease: Secondary | ICD-10-CM | POA: Diagnosis not present

## 2023-03-07 DIAGNOSIS — R2689 Other abnormalities of gait and mobility: Secondary | ICD-10-CM | POA: Diagnosis not present

## 2023-03-07 DIAGNOSIS — N186 End stage renal disease: Secondary | ICD-10-CM | POA: Diagnosis not present

## 2023-03-07 DIAGNOSIS — M6281 Muscle weakness (generalized): Secondary | ICD-10-CM | POA: Diagnosis not present

## 2023-03-07 DIAGNOSIS — Z992 Dependence on renal dialysis: Secondary | ICD-10-CM | POA: Diagnosis not present

## 2023-03-07 DIAGNOSIS — R262 Difficulty in walking, not elsewhere classified: Secondary | ICD-10-CM | POA: Diagnosis not present

## 2023-03-07 DIAGNOSIS — E43 Unspecified severe protein-calorie malnutrition: Secondary | ICD-10-CM | POA: Diagnosis not present

## 2023-03-09 DIAGNOSIS — N186 End stage renal disease: Secondary | ICD-10-CM | POA: Diagnosis not present

## 2023-03-09 DIAGNOSIS — Z992 Dependence on renal dialysis: Secondary | ICD-10-CM | POA: Diagnosis not present

## 2023-03-09 DIAGNOSIS — E43 Unspecified severe protein-calorie malnutrition: Secondary | ICD-10-CM | POA: Diagnosis not present

## 2023-03-12 DIAGNOSIS — N186 End stage renal disease: Secondary | ICD-10-CM | POA: Diagnosis not present

## 2023-03-12 DIAGNOSIS — E43 Unspecified severe protein-calorie malnutrition: Secondary | ICD-10-CM | POA: Diagnosis not present

## 2023-03-13 DIAGNOSIS — N186 End stage renal disease: Secondary | ICD-10-CM | POA: Diagnosis not present

## 2023-03-13 DIAGNOSIS — Z992 Dependence on renal dialysis: Secondary | ICD-10-CM | POA: Diagnosis not present

## 2023-03-14 DIAGNOSIS — M6281 Muscle weakness (generalized): Secondary | ICD-10-CM | POA: Diagnosis not present

## 2023-03-14 DIAGNOSIS — Z992 Dependence on renal dialysis: Secondary | ICD-10-CM | POA: Diagnosis not present

## 2023-03-14 DIAGNOSIS — R2689 Other abnormalities of gait and mobility: Secondary | ICD-10-CM | POA: Diagnosis not present

## 2023-03-14 DIAGNOSIS — I132 Hypertensive heart and chronic kidney disease with heart failure and with stage 5 chronic kidney disease, or end stage renal disease: Secondary | ICD-10-CM | POA: Diagnosis not present

## 2023-03-14 DIAGNOSIS — R262 Difficulty in walking, not elsewhere classified: Secondary | ICD-10-CM | POA: Diagnosis not present

## 2023-03-14 DIAGNOSIS — N186 End stage renal disease: Secondary | ICD-10-CM | POA: Diagnosis not present

## 2023-03-14 DIAGNOSIS — E43 Unspecified severe protein-calorie malnutrition: Secondary | ICD-10-CM | POA: Diagnosis not present

## 2023-03-16 DIAGNOSIS — R262 Difficulty in walking, not elsewhere classified: Secondary | ICD-10-CM | POA: Diagnosis not present

## 2023-03-16 DIAGNOSIS — E43 Unspecified severe protein-calorie malnutrition: Secondary | ICD-10-CM | POA: Diagnosis not present

## 2023-03-16 DIAGNOSIS — R2689 Other abnormalities of gait and mobility: Secondary | ICD-10-CM | POA: Diagnosis not present

## 2023-03-16 DIAGNOSIS — N186 End stage renal disease: Secondary | ICD-10-CM | POA: Diagnosis not present

## 2023-03-16 DIAGNOSIS — Z992 Dependence on renal dialysis: Secondary | ICD-10-CM | POA: Diagnosis not present

## 2023-03-16 DIAGNOSIS — M6281 Muscle weakness (generalized): Secondary | ICD-10-CM | POA: Diagnosis not present

## 2023-03-16 DIAGNOSIS — I132 Hypertensive heart and chronic kidney disease with heart failure and with stage 5 chronic kidney disease, or end stage renal disease: Secondary | ICD-10-CM | POA: Diagnosis not present

## 2023-03-18 DIAGNOSIS — R2689 Other abnormalities of gait and mobility: Secondary | ICD-10-CM | POA: Diagnosis not present

## 2023-03-18 DIAGNOSIS — R262 Difficulty in walking, not elsewhere classified: Secondary | ICD-10-CM | POA: Diagnosis not present

## 2023-03-18 DIAGNOSIS — M6281 Muscle weakness (generalized): Secondary | ICD-10-CM | POA: Diagnosis not present

## 2023-03-18 DIAGNOSIS — I132 Hypertensive heart and chronic kidney disease with heart failure and with stage 5 chronic kidney disease, or end stage renal disease: Secondary | ICD-10-CM | POA: Diagnosis not present

## 2023-03-19 DIAGNOSIS — N186 End stage renal disease: Secondary | ICD-10-CM | POA: Diagnosis not present

## 2023-03-19 DIAGNOSIS — E43 Unspecified severe protein-calorie malnutrition: Secondary | ICD-10-CM | POA: Diagnosis not present

## 2023-03-19 DIAGNOSIS — Z992 Dependence on renal dialysis: Secondary | ICD-10-CM | POA: Diagnosis not present

## 2023-03-21 DIAGNOSIS — Z992 Dependence on renal dialysis: Secondary | ICD-10-CM | POA: Diagnosis not present

## 2023-03-21 DIAGNOSIS — E43 Unspecified severe protein-calorie malnutrition: Secondary | ICD-10-CM | POA: Diagnosis not present

## 2023-03-21 DIAGNOSIS — N186 End stage renal disease: Secondary | ICD-10-CM | POA: Diagnosis not present

## 2023-03-22 DIAGNOSIS — I130 Hypertensive heart and chronic kidney disease with heart failure and stage 1 through stage 4 chronic kidney disease, or unspecified chronic kidney disease: Secondary | ICD-10-CM | POA: Diagnosis not present

## 2023-03-22 DIAGNOSIS — E1122 Type 2 diabetes mellitus with diabetic chronic kidney disease: Secondary | ICD-10-CM | POA: Diagnosis not present

## 2023-03-22 DIAGNOSIS — I5032 Chronic diastolic (congestive) heart failure: Secondary | ICD-10-CM | POA: Diagnosis not present

## 2023-03-22 DIAGNOSIS — Z79899 Other long term (current) drug therapy: Secondary | ICD-10-CM | POA: Diagnosis not present

## 2023-03-22 DIAGNOSIS — N186 End stage renal disease: Secondary | ICD-10-CM | POA: Diagnosis not present

## 2023-03-22 DIAGNOSIS — Z992 Dependence on renal dialysis: Secondary | ICD-10-CM | POA: Diagnosis not present

## 2023-03-23 DIAGNOSIS — Z992 Dependence on renal dialysis: Secondary | ICD-10-CM | POA: Diagnosis not present

## 2023-03-23 DIAGNOSIS — N186 End stage renal disease: Secondary | ICD-10-CM | POA: Diagnosis not present

## 2023-03-23 DIAGNOSIS — E43 Unspecified severe protein-calorie malnutrition: Secondary | ICD-10-CM | POA: Diagnosis not present

## 2023-03-26 DIAGNOSIS — N186 End stage renal disease: Secondary | ICD-10-CM | POA: Diagnosis not present

## 2023-03-26 DIAGNOSIS — E43 Unspecified severe protein-calorie malnutrition: Secondary | ICD-10-CM | POA: Diagnosis not present

## 2023-03-26 DIAGNOSIS — Z992 Dependence on renal dialysis: Secondary | ICD-10-CM | POA: Diagnosis not present

## 2023-03-28 DIAGNOSIS — Z992 Dependence on renal dialysis: Secondary | ICD-10-CM | POA: Diagnosis not present

## 2023-03-28 DIAGNOSIS — N186 End stage renal disease: Secondary | ICD-10-CM | POA: Diagnosis not present

## 2023-03-28 DIAGNOSIS — E43 Unspecified severe protein-calorie malnutrition: Secondary | ICD-10-CM | POA: Diagnosis not present

## 2023-03-30 DIAGNOSIS — E43 Unspecified severe protein-calorie malnutrition: Secondary | ICD-10-CM | POA: Diagnosis not present

## 2023-03-30 DIAGNOSIS — N186 End stage renal disease: Secondary | ICD-10-CM | POA: Diagnosis not present

## 2023-03-31 DIAGNOSIS — Z992 Dependence on renal dialysis: Secondary | ICD-10-CM | POA: Diagnosis not present

## 2023-03-31 DIAGNOSIS — N186 End stage renal disease: Secondary | ICD-10-CM | POA: Diagnosis not present

## 2023-04-01 DIAGNOSIS — I132 Hypertensive heart and chronic kidney disease with heart failure and with stage 5 chronic kidney disease, or end stage renal disease: Secondary | ICD-10-CM | POA: Diagnosis not present

## 2023-04-01 DIAGNOSIS — R2689 Other abnormalities of gait and mobility: Secondary | ICD-10-CM | POA: Diagnosis not present

## 2023-04-01 DIAGNOSIS — M6281 Muscle weakness (generalized): Secondary | ICD-10-CM | POA: Diagnosis not present

## 2023-04-01 DIAGNOSIS — R262 Difficulty in walking, not elsewhere classified: Secondary | ICD-10-CM | POA: Diagnosis not present

## 2023-04-02 DIAGNOSIS — E43 Unspecified severe protein-calorie malnutrition: Secondary | ICD-10-CM | POA: Diagnosis not present

## 2023-04-02 DIAGNOSIS — R2689 Other abnormalities of gait and mobility: Secondary | ICD-10-CM | POA: Diagnosis not present

## 2023-04-02 DIAGNOSIS — M6281 Muscle weakness (generalized): Secondary | ICD-10-CM | POA: Diagnosis not present

## 2023-04-02 DIAGNOSIS — N186 End stage renal disease: Secondary | ICD-10-CM | POA: Diagnosis not present

## 2023-04-02 DIAGNOSIS — R262 Difficulty in walking, not elsewhere classified: Secondary | ICD-10-CM | POA: Diagnosis not present

## 2023-04-02 DIAGNOSIS — I132 Hypertensive heart and chronic kidney disease with heart failure and with stage 5 chronic kidney disease, or end stage renal disease: Secondary | ICD-10-CM | POA: Diagnosis not present

## 2023-04-02 DIAGNOSIS — Z992 Dependence on renal dialysis: Secondary | ICD-10-CM | POA: Diagnosis not present

## 2023-04-04 DIAGNOSIS — Z992 Dependence on renal dialysis: Secondary | ICD-10-CM | POA: Diagnosis not present

## 2023-04-04 DIAGNOSIS — E43 Unspecified severe protein-calorie malnutrition: Secondary | ICD-10-CM | POA: Diagnosis not present

## 2023-04-04 DIAGNOSIS — N186 End stage renal disease: Secondary | ICD-10-CM | POA: Diagnosis not present

## 2023-04-05 DIAGNOSIS — R262 Difficulty in walking, not elsewhere classified: Secondary | ICD-10-CM | POA: Diagnosis not present

## 2023-04-05 DIAGNOSIS — I132 Hypertensive heart and chronic kidney disease with heart failure and with stage 5 chronic kidney disease, or end stage renal disease: Secondary | ICD-10-CM | POA: Diagnosis not present

## 2023-04-05 DIAGNOSIS — M6281 Muscle weakness (generalized): Secondary | ICD-10-CM | POA: Diagnosis not present

## 2023-04-05 DIAGNOSIS — R2689 Other abnormalities of gait and mobility: Secondary | ICD-10-CM | POA: Diagnosis not present

## 2023-04-06 DIAGNOSIS — R262 Difficulty in walking, not elsewhere classified: Secondary | ICD-10-CM | POA: Diagnosis not present

## 2023-04-06 DIAGNOSIS — R2689 Other abnormalities of gait and mobility: Secondary | ICD-10-CM | POA: Diagnosis not present

## 2023-04-06 DIAGNOSIS — N186 End stage renal disease: Secondary | ICD-10-CM | POA: Diagnosis not present

## 2023-04-06 DIAGNOSIS — M6281 Muscle weakness (generalized): Secondary | ICD-10-CM | POA: Diagnosis not present

## 2023-04-06 DIAGNOSIS — E43 Unspecified severe protein-calorie malnutrition: Secondary | ICD-10-CM | POA: Diagnosis not present

## 2023-04-06 DIAGNOSIS — Z992 Dependence on renal dialysis: Secondary | ICD-10-CM | POA: Diagnosis not present

## 2023-04-06 DIAGNOSIS — I132 Hypertensive heart and chronic kidney disease with heart failure and with stage 5 chronic kidney disease, or end stage renal disease: Secondary | ICD-10-CM | POA: Diagnosis not present

## 2023-04-09 DIAGNOSIS — M6281 Muscle weakness (generalized): Secondary | ICD-10-CM | POA: Diagnosis not present

## 2023-04-09 DIAGNOSIS — N186 End stage renal disease: Secondary | ICD-10-CM | POA: Diagnosis not present

## 2023-04-09 DIAGNOSIS — R262 Difficulty in walking, not elsewhere classified: Secondary | ICD-10-CM | POA: Diagnosis not present

## 2023-04-09 DIAGNOSIS — E43 Unspecified severe protein-calorie malnutrition: Secondary | ICD-10-CM | POA: Diagnosis not present

## 2023-04-09 DIAGNOSIS — Z992 Dependence on renal dialysis: Secondary | ICD-10-CM | POA: Diagnosis not present

## 2023-04-09 DIAGNOSIS — I132 Hypertensive heart and chronic kidney disease with heart failure and with stage 5 chronic kidney disease, or end stage renal disease: Secondary | ICD-10-CM | POA: Diagnosis not present

## 2023-04-09 DIAGNOSIS — R2689 Other abnormalities of gait and mobility: Secondary | ICD-10-CM | POA: Diagnosis not present

## 2023-04-10 DIAGNOSIS — M6281 Muscle weakness (generalized): Secondary | ICD-10-CM | POA: Diagnosis not present

## 2023-04-10 DIAGNOSIS — I132 Hypertensive heart and chronic kidney disease with heart failure and with stage 5 chronic kidney disease, or end stage renal disease: Secondary | ICD-10-CM | POA: Diagnosis not present

## 2023-04-10 DIAGNOSIS — R2689 Other abnormalities of gait and mobility: Secondary | ICD-10-CM | POA: Diagnosis not present

## 2023-04-10 DIAGNOSIS — R262 Difficulty in walking, not elsewhere classified: Secondary | ICD-10-CM | POA: Diagnosis not present

## 2023-04-11 DIAGNOSIS — Z992 Dependence on renal dialysis: Secondary | ICD-10-CM | POA: Diagnosis not present

## 2023-04-11 DIAGNOSIS — I132 Hypertensive heart and chronic kidney disease with heart failure and with stage 5 chronic kidney disease, or end stage renal disease: Secondary | ICD-10-CM | POA: Diagnosis not present

## 2023-04-11 DIAGNOSIS — N186 End stage renal disease: Secondary | ICD-10-CM | POA: Diagnosis not present

## 2023-04-11 DIAGNOSIS — R2689 Other abnormalities of gait and mobility: Secondary | ICD-10-CM | POA: Diagnosis not present

## 2023-04-11 DIAGNOSIS — R262 Difficulty in walking, not elsewhere classified: Secondary | ICD-10-CM | POA: Diagnosis not present

## 2023-04-11 DIAGNOSIS — E43 Unspecified severe protein-calorie malnutrition: Secondary | ICD-10-CM | POA: Diagnosis not present

## 2023-04-11 DIAGNOSIS — M6281 Muscle weakness (generalized): Secondary | ICD-10-CM | POA: Diagnosis not present

## 2023-04-13 DIAGNOSIS — N186 End stage renal disease: Secondary | ICD-10-CM | POA: Diagnosis not present

## 2023-04-13 DIAGNOSIS — E43 Unspecified severe protein-calorie malnutrition: Secondary | ICD-10-CM | POA: Diagnosis not present

## 2023-04-14 DIAGNOSIS — Z992 Dependence on renal dialysis: Secondary | ICD-10-CM | POA: Diagnosis not present

## 2023-04-14 DIAGNOSIS — N186 End stage renal disease: Secondary | ICD-10-CM | POA: Diagnosis not present

## 2023-04-16 DIAGNOSIS — N186 End stage renal disease: Secondary | ICD-10-CM | POA: Diagnosis not present

## 2023-04-16 DIAGNOSIS — E43 Unspecified severe protein-calorie malnutrition: Secondary | ICD-10-CM | POA: Diagnosis not present

## 2023-04-17 DIAGNOSIS — Z992 Dependence on renal dialysis: Secondary | ICD-10-CM | POA: Diagnosis not present

## 2023-04-17 DIAGNOSIS — N186 End stage renal disease: Secondary | ICD-10-CM | POA: Diagnosis not present

## 2023-04-18 DIAGNOSIS — I700 Atherosclerosis of aorta: Secondary | ICD-10-CM | POA: Diagnosis not present

## 2023-04-18 DIAGNOSIS — E43 Unspecified severe protein-calorie malnutrition: Secondary | ICD-10-CM | POA: Diagnosis not present

## 2023-04-18 DIAGNOSIS — E785 Hyperlipidemia, unspecified: Secondary | ICD-10-CM | POA: Diagnosis not present

## 2023-04-18 DIAGNOSIS — D631 Anemia in chronic kidney disease: Secondary | ICD-10-CM | POA: Diagnosis not present

## 2023-04-18 DIAGNOSIS — I130 Hypertensive heart and chronic kidney disease with heart failure and stage 1 through stage 4 chronic kidney disease, or unspecified chronic kidney disease: Secondary | ICD-10-CM | POA: Diagnosis not present

## 2023-04-18 DIAGNOSIS — Z992 Dependence on renal dialysis: Secondary | ICD-10-CM | POA: Diagnosis not present

## 2023-04-18 DIAGNOSIS — E1122 Type 2 diabetes mellitus with diabetic chronic kidney disease: Secondary | ICD-10-CM | POA: Diagnosis not present

## 2023-04-18 DIAGNOSIS — E1142 Type 2 diabetes mellitus with diabetic polyneuropathy: Secondary | ICD-10-CM | POA: Diagnosis not present

## 2023-04-18 DIAGNOSIS — N186 End stage renal disease: Secondary | ICD-10-CM | POA: Diagnosis not present

## 2023-04-18 DIAGNOSIS — I5032 Chronic diastolic (congestive) heart failure: Secondary | ICD-10-CM | POA: Diagnosis not present

## 2023-04-18 DIAGNOSIS — I4891 Unspecified atrial fibrillation: Secondary | ICD-10-CM | POA: Diagnosis not present

## 2023-04-18 DIAGNOSIS — E559 Vitamin D deficiency, unspecified: Secondary | ICD-10-CM | POA: Diagnosis not present

## 2023-04-19 DIAGNOSIS — Z992 Dependence on renal dialysis: Secondary | ICD-10-CM | POA: Diagnosis not present

## 2023-04-19 DIAGNOSIS — N186 End stage renal disease: Secondary | ICD-10-CM | POA: Diagnosis not present

## 2023-04-21 DIAGNOSIS — N186 End stage renal disease: Secondary | ICD-10-CM | POA: Diagnosis not present

## 2023-04-21 DIAGNOSIS — E43 Unspecified severe protein-calorie malnutrition: Secondary | ICD-10-CM | POA: Diagnosis not present

## 2023-04-21 DIAGNOSIS — Z992 Dependence on renal dialysis: Secondary | ICD-10-CM | POA: Diagnosis not present

## 2023-04-23 DIAGNOSIS — Z992 Dependence on renal dialysis: Secondary | ICD-10-CM | POA: Diagnosis not present

## 2023-04-23 DIAGNOSIS — N186 End stage renal disease: Secondary | ICD-10-CM | POA: Diagnosis not present

## 2023-04-24 DIAGNOSIS — N186 End stage renal disease: Secondary | ICD-10-CM | POA: Diagnosis not present

## 2023-04-24 DIAGNOSIS — E43 Unspecified severe protein-calorie malnutrition: Secondary | ICD-10-CM | POA: Diagnosis not present

## 2023-04-26 DIAGNOSIS — E43 Unspecified severe protein-calorie malnutrition: Secondary | ICD-10-CM | POA: Diagnosis not present

## 2023-04-26 DIAGNOSIS — N186 End stage renal disease: Secondary | ICD-10-CM | POA: Diagnosis not present

## 2023-04-28 DIAGNOSIS — N186 End stage renal disease: Secondary | ICD-10-CM | POA: Diagnosis not present

## 2023-04-28 DIAGNOSIS — Z992 Dependence on renal dialysis: Secondary | ICD-10-CM | POA: Diagnosis not present

## 2023-04-28 DIAGNOSIS — E43 Unspecified severe protein-calorie malnutrition: Secondary | ICD-10-CM | POA: Diagnosis not present

## 2023-05-01 DIAGNOSIS — Z992 Dependence on renal dialysis: Secondary | ICD-10-CM | POA: Diagnosis not present

## 2023-05-01 DIAGNOSIS — N186 End stage renal disease: Secondary | ICD-10-CM | POA: Diagnosis not present

## 2023-05-01 DIAGNOSIS — E43 Unspecified severe protein-calorie malnutrition: Secondary | ICD-10-CM | POA: Diagnosis not present

## 2023-05-03 DIAGNOSIS — K5909 Other constipation: Secondary | ICD-10-CM | POA: Diagnosis not present

## 2023-05-03 DIAGNOSIS — N186 End stage renal disease: Secondary | ICD-10-CM | POA: Diagnosis not present

## 2023-05-03 DIAGNOSIS — Z79899 Other long term (current) drug therapy: Secondary | ICD-10-CM | POA: Diagnosis not present

## 2023-05-03 DIAGNOSIS — R109 Unspecified abdominal pain: Secondary | ICD-10-CM | POA: Diagnosis not present

## 2023-05-03 DIAGNOSIS — R197 Diarrhea, unspecified: Secondary | ICD-10-CM | POA: Diagnosis not present

## 2023-05-03 DIAGNOSIS — E43 Unspecified severe protein-calorie malnutrition: Secondary | ICD-10-CM | POA: Diagnosis not present

## 2023-05-04 ENCOUNTER — Ambulatory Visit: Payer: 59 | Admitting: Cardiology

## 2023-05-05 DIAGNOSIS — N186 End stage renal disease: Secondary | ICD-10-CM | POA: Diagnosis not present

## 2023-05-05 DIAGNOSIS — E43 Unspecified severe protein-calorie malnutrition: Secondary | ICD-10-CM | POA: Diagnosis not present

## 2023-05-05 DIAGNOSIS — Z992 Dependence on renal dialysis: Secondary | ICD-10-CM | POA: Diagnosis not present

## 2023-05-07 DIAGNOSIS — E7800 Pure hypercholesterolemia, unspecified: Secondary | ICD-10-CM | POA: Diagnosis not present

## 2023-05-07 DIAGNOSIS — Z79899 Other long term (current) drug therapy: Secondary | ICD-10-CM | POA: Diagnosis not present

## 2023-05-08 ENCOUNTER — Ambulatory Visit: Payer: Self-pay | Admitting: Nurse Practitioner

## 2023-05-08 DIAGNOSIS — E43 Unspecified severe protein-calorie malnutrition: Secondary | ICD-10-CM | POA: Diagnosis not present

## 2023-05-08 DIAGNOSIS — Z992 Dependence on renal dialysis: Secondary | ICD-10-CM | POA: Diagnosis not present

## 2023-05-08 DIAGNOSIS — N186 End stage renal disease: Secondary | ICD-10-CM | POA: Diagnosis not present

## 2023-05-10 DIAGNOSIS — N186 End stage renal disease: Secondary | ICD-10-CM | POA: Diagnosis not present

## 2023-05-10 DIAGNOSIS — E43 Unspecified severe protein-calorie malnutrition: Secondary | ICD-10-CM | POA: Diagnosis not present

## 2023-05-10 DIAGNOSIS — Z992 Dependence on renal dialysis: Secondary | ICD-10-CM | POA: Diagnosis not present

## 2023-05-12 DIAGNOSIS — N186 End stage renal disease: Secondary | ICD-10-CM | POA: Diagnosis not present

## 2023-05-12 DIAGNOSIS — E43 Unspecified severe protein-calorie malnutrition: Secondary | ICD-10-CM | POA: Diagnosis not present

## 2023-05-15 DIAGNOSIS — E119 Type 2 diabetes mellitus without complications: Secondary | ICD-10-CM | POA: Diagnosis not present

## 2023-05-15 DIAGNOSIS — E43 Unspecified severe protein-calorie malnutrition: Secondary | ICD-10-CM | POA: Diagnosis not present

## 2023-05-15 DIAGNOSIS — N186 End stage renal disease: Secondary | ICD-10-CM | POA: Diagnosis not present

## 2023-05-15 DIAGNOSIS — Z992 Dependence on renal dialysis: Secondary | ICD-10-CM | POA: Diagnosis not present

## 2023-05-17 DIAGNOSIS — N186 End stage renal disease: Secondary | ICD-10-CM | POA: Diagnosis not present

## 2023-05-17 DIAGNOSIS — E43 Unspecified severe protein-calorie malnutrition: Secondary | ICD-10-CM | POA: Diagnosis not present

## 2023-05-17 DIAGNOSIS — Z992 Dependence on renal dialysis: Secondary | ICD-10-CM | POA: Diagnosis not present

## 2023-05-19 DIAGNOSIS — N186 End stage renal disease: Secondary | ICD-10-CM | POA: Diagnosis not present

## 2023-05-19 DIAGNOSIS — E43 Unspecified severe protein-calorie malnutrition: Secondary | ICD-10-CM | POA: Diagnosis not present

## 2023-05-19 DIAGNOSIS — Z992 Dependence on renal dialysis: Secondary | ICD-10-CM | POA: Diagnosis not present

## 2023-05-21 DIAGNOSIS — H1032 Unspecified acute conjunctivitis, left eye: Secondary | ICD-10-CM | POA: Diagnosis not present

## 2023-05-21 DIAGNOSIS — Z79899 Other long term (current) drug therapy: Secondary | ICD-10-CM | POA: Diagnosis not present

## 2023-05-22 DIAGNOSIS — N186 End stage renal disease: Secondary | ICD-10-CM | POA: Diagnosis not present

## 2023-05-22 DIAGNOSIS — E43 Unspecified severe protein-calorie malnutrition: Secondary | ICD-10-CM | POA: Diagnosis not present

## 2023-05-24 DIAGNOSIS — E43 Unspecified severe protein-calorie malnutrition: Secondary | ICD-10-CM | POA: Diagnosis not present

## 2023-05-24 DIAGNOSIS — N186 End stage renal disease: Secondary | ICD-10-CM | POA: Diagnosis not present

## 2023-05-24 DIAGNOSIS — Z992 Dependence on renal dialysis: Secondary | ICD-10-CM | POA: Diagnosis not present

## 2023-05-26 DIAGNOSIS — E43 Unspecified severe protein-calorie malnutrition: Secondary | ICD-10-CM | POA: Diagnosis not present

## 2023-05-26 DIAGNOSIS — Z992 Dependence on renal dialysis: Secondary | ICD-10-CM | POA: Diagnosis not present

## 2023-05-26 DIAGNOSIS — N186 End stage renal disease: Secondary | ICD-10-CM | POA: Diagnosis not present

## 2023-05-29 DIAGNOSIS — Z992 Dependence on renal dialysis: Secondary | ICD-10-CM | POA: Diagnosis not present

## 2023-05-29 DIAGNOSIS — E43 Unspecified severe protein-calorie malnutrition: Secondary | ICD-10-CM | POA: Diagnosis not present

## 2023-05-29 DIAGNOSIS — N186 End stage renal disease: Secondary | ICD-10-CM | POA: Diagnosis not present

## 2023-05-30 DIAGNOSIS — I132 Hypertensive heart and chronic kidney disease with heart failure and with stage 5 chronic kidney disease, or end stage renal disease: Secondary | ICD-10-CM | POA: Diagnosis not present

## 2023-05-30 DIAGNOSIS — M6281 Muscle weakness (generalized): Secondary | ICD-10-CM | POA: Diagnosis not present

## 2023-05-31 DIAGNOSIS — Z992 Dependence on renal dialysis: Secondary | ICD-10-CM | POA: Diagnosis not present

## 2023-05-31 DIAGNOSIS — N186 End stage renal disease: Secondary | ICD-10-CM | POA: Diagnosis not present

## 2023-05-31 DIAGNOSIS — E43 Unspecified severe protein-calorie malnutrition: Secondary | ICD-10-CM | POA: Diagnosis not present

## 2023-06-01 DIAGNOSIS — N186 End stage renal disease: Secondary | ICD-10-CM | POA: Diagnosis not present

## 2023-06-01 DIAGNOSIS — Z992 Dependence on renal dialysis: Secondary | ICD-10-CM | POA: Diagnosis not present

## 2023-06-02 DIAGNOSIS — E43 Unspecified severe protein-calorie malnutrition: Secondary | ICD-10-CM | POA: Diagnosis not present

## 2023-06-02 DIAGNOSIS — Z992 Dependence on renal dialysis: Secondary | ICD-10-CM | POA: Diagnosis not present

## 2023-06-02 DIAGNOSIS — N186 End stage renal disease: Secondary | ICD-10-CM | POA: Diagnosis not present

## 2023-06-05 DIAGNOSIS — Z992 Dependence on renal dialysis: Secondary | ICD-10-CM | POA: Diagnosis not present

## 2023-06-05 DIAGNOSIS — N186 End stage renal disease: Secondary | ICD-10-CM | POA: Diagnosis not present

## 2023-06-05 DIAGNOSIS — E43 Unspecified severe protein-calorie malnutrition: Secondary | ICD-10-CM | POA: Diagnosis not present

## 2023-06-07 DIAGNOSIS — Z992 Dependence on renal dialysis: Secondary | ICD-10-CM | POA: Diagnosis not present

## 2023-06-07 DIAGNOSIS — E43 Unspecified severe protein-calorie malnutrition: Secondary | ICD-10-CM | POA: Diagnosis not present

## 2023-06-07 DIAGNOSIS — N186 End stage renal disease: Secondary | ICD-10-CM | POA: Diagnosis not present

## 2023-06-08 ENCOUNTER — Ambulatory Visit: Payer: 59 | Admitting: Nurse Practitioner

## 2023-06-09 DIAGNOSIS — N186 End stage renal disease: Secondary | ICD-10-CM | POA: Diagnosis not present

## 2023-06-09 DIAGNOSIS — Z992 Dependence on renal dialysis: Secondary | ICD-10-CM | POA: Diagnosis not present

## 2023-06-09 DIAGNOSIS — E43 Unspecified severe protein-calorie malnutrition: Secondary | ICD-10-CM | POA: Diagnosis not present

## 2023-06-12 DIAGNOSIS — R059 Cough, unspecified: Secondary | ICD-10-CM | POA: Diagnosis not present

## 2023-06-12 DIAGNOSIS — N186 End stage renal disease: Secondary | ICD-10-CM | POA: Diagnosis not present

## 2023-06-12 DIAGNOSIS — E43 Unspecified severe protein-calorie malnutrition: Secondary | ICD-10-CM | POA: Diagnosis not present

## 2023-06-14 DIAGNOSIS — Z992 Dependence on renal dialysis: Secondary | ICD-10-CM | POA: Diagnosis not present

## 2023-06-14 DIAGNOSIS — N186 End stage renal disease: Secondary | ICD-10-CM | POA: Diagnosis not present

## 2023-06-14 DIAGNOSIS — E43 Unspecified severe protein-calorie malnutrition: Secondary | ICD-10-CM | POA: Diagnosis not present

## 2023-06-16 DIAGNOSIS — N186 End stage renal disease: Secondary | ICD-10-CM | POA: Diagnosis not present

## 2023-06-16 DIAGNOSIS — E43 Unspecified severe protein-calorie malnutrition: Secondary | ICD-10-CM | POA: Diagnosis not present

## 2023-06-16 DIAGNOSIS — Z992 Dependence on renal dialysis: Secondary | ICD-10-CM | POA: Diagnosis not present

## 2023-06-19 DIAGNOSIS — N186 End stage renal disease: Secondary | ICD-10-CM | POA: Diagnosis not present

## 2023-06-19 DIAGNOSIS — Z992 Dependence on renal dialysis: Secondary | ICD-10-CM | POA: Diagnosis not present

## 2023-06-19 DIAGNOSIS — E43 Unspecified severe protein-calorie malnutrition: Secondary | ICD-10-CM | POA: Diagnosis not present

## 2023-06-21 DIAGNOSIS — R269 Unspecified abnormalities of gait and mobility: Secondary | ICD-10-CM | POA: Diagnosis not present

## 2023-06-21 DIAGNOSIS — R2689 Other abnormalities of gait and mobility: Secondary | ICD-10-CM | POA: Diagnosis not present

## 2023-06-21 DIAGNOSIS — N186 End stage renal disease: Secondary | ICD-10-CM | POA: Diagnosis not present

## 2023-06-21 DIAGNOSIS — M6281 Muscle weakness (generalized): Secondary | ICD-10-CM | POA: Diagnosis not present

## 2023-06-21 DIAGNOSIS — E43 Unspecified severe protein-calorie malnutrition: Secondary | ICD-10-CM | POA: Diagnosis not present

## 2023-06-21 DIAGNOSIS — I132 Hypertensive heart and chronic kidney disease with heart failure and with stage 5 chronic kidney disease, or end stage renal disease: Secondary | ICD-10-CM | POA: Diagnosis not present

## 2023-06-23 DIAGNOSIS — E43 Unspecified severe protein-calorie malnutrition: Secondary | ICD-10-CM | POA: Diagnosis not present

## 2023-06-23 DIAGNOSIS — N186 End stage renal disease: Secondary | ICD-10-CM | POA: Diagnosis not present

## 2023-06-26 DIAGNOSIS — Z992 Dependence on renal dialysis: Secondary | ICD-10-CM | POA: Diagnosis not present

## 2023-06-26 DIAGNOSIS — N186 End stage renal disease: Secondary | ICD-10-CM | POA: Diagnosis not present

## 2023-06-26 DIAGNOSIS — E43 Unspecified severe protein-calorie malnutrition: Secondary | ICD-10-CM | POA: Diagnosis not present

## 2023-06-28 DIAGNOSIS — N186 End stage renal disease: Secondary | ICD-10-CM | POA: Diagnosis not present

## 2023-06-28 DIAGNOSIS — E43 Unspecified severe protein-calorie malnutrition: Secondary | ICD-10-CM | POA: Diagnosis not present

## 2023-06-29 DIAGNOSIS — Z992 Dependence on renal dialysis: Secondary | ICD-10-CM | POA: Diagnosis not present

## 2023-06-29 DIAGNOSIS — N186 End stage renal disease: Secondary | ICD-10-CM | POA: Diagnosis not present

## 2023-06-30 DIAGNOSIS — E43 Unspecified severe protein-calorie malnutrition: Secondary | ICD-10-CM | POA: Diagnosis not present

## 2023-06-30 DIAGNOSIS — N186 End stage renal disease: Secondary | ICD-10-CM | POA: Diagnosis not present

## 2023-06-30 DIAGNOSIS — Z992 Dependence on renal dialysis: Secondary | ICD-10-CM | POA: Diagnosis not present

## 2023-07-03 DIAGNOSIS — R059 Cough, unspecified: Secondary | ICD-10-CM | POA: Diagnosis not present

## 2023-07-03 DIAGNOSIS — E43 Unspecified severe protein-calorie malnutrition: Secondary | ICD-10-CM | POA: Diagnosis not present

## 2023-07-03 DIAGNOSIS — R0989 Other specified symptoms and signs involving the circulatory and respiratory systems: Secondary | ICD-10-CM | POA: Diagnosis not present

## 2023-07-03 DIAGNOSIS — N390 Urinary tract infection, site not specified: Secondary | ICD-10-CM | POA: Diagnosis not present

## 2023-07-03 DIAGNOSIS — N186 End stage renal disease: Secondary | ICD-10-CM | POA: Diagnosis not present

## 2023-07-05 DIAGNOSIS — Z992 Dependence on renal dialysis: Secondary | ICD-10-CM | POA: Diagnosis not present

## 2023-07-05 DIAGNOSIS — N186 End stage renal disease: Secondary | ICD-10-CM | POA: Diagnosis not present

## 2023-07-05 DIAGNOSIS — E43 Unspecified severe protein-calorie malnutrition: Secondary | ICD-10-CM | POA: Diagnosis not present

## 2023-07-06 DIAGNOSIS — N186 End stage renal disease: Secondary | ICD-10-CM | POA: Diagnosis not present

## 2023-07-06 DIAGNOSIS — Z992 Dependence on renal dialysis: Secondary | ICD-10-CM | POA: Diagnosis not present

## 2023-07-07 DIAGNOSIS — N186 End stage renal disease: Secondary | ICD-10-CM | POA: Diagnosis not present

## 2023-07-07 DIAGNOSIS — Z992 Dependence on renal dialysis: Secondary | ICD-10-CM | POA: Diagnosis not present

## 2023-07-07 DIAGNOSIS — E43 Unspecified severe protein-calorie malnutrition: Secondary | ICD-10-CM | POA: Diagnosis not present

## 2023-07-09 ENCOUNTER — Ambulatory Visit: Payer: 59 | Admitting: Obstetrics & Gynecology

## 2023-07-09 VITALS — BP 105/66 | HR 77

## 2023-07-09 DIAGNOSIS — Z30431 Encounter for routine checking of intrauterine contraceptive device: Secondary | ICD-10-CM | POA: Diagnosis not present

## 2023-07-09 DIAGNOSIS — R6889 Other general symptoms and signs: Secondary | ICD-10-CM | POA: Diagnosis not present

## 2023-07-09 DIAGNOSIS — Z743 Need for continuous supervision: Secondary | ICD-10-CM | POA: Diagnosis not present

## 2023-07-10 DIAGNOSIS — N390 Urinary tract infection, site not specified: Secondary | ICD-10-CM | POA: Diagnosis not present

## 2023-07-10 DIAGNOSIS — Z79899 Other long term (current) drug therapy: Secondary | ICD-10-CM | POA: Diagnosis not present

## 2023-07-10 DIAGNOSIS — E43 Unspecified severe protein-calorie malnutrition: Secondary | ICD-10-CM | POA: Diagnosis not present

## 2023-07-10 DIAGNOSIS — N186 End stage renal disease: Secondary | ICD-10-CM | POA: Diagnosis not present

## 2023-07-12 DIAGNOSIS — E43 Unspecified severe protein-calorie malnutrition: Secondary | ICD-10-CM | POA: Diagnosis not present

## 2023-07-12 DIAGNOSIS — N186 End stage renal disease: Secondary | ICD-10-CM | POA: Diagnosis not present

## 2023-07-12 DIAGNOSIS — Z992 Dependence on renal dialysis: Secondary | ICD-10-CM | POA: Diagnosis not present

## 2023-07-14 DIAGNOSIS — E43 Unspecified severe protein-calorie malnutrition: Secondary | ICD-10-CM | POA: Diagnosis not present

## 2023-07-14 DIAGNOSIS — N186 End stage renal disease: Secondary | ICD-10-CM | POA: Diagnosis not present

## 2023-07-14 DIAGNOSIS — Z992 Dependence on renal dialysis: Secondary | ICD-10-CM | POA: Diagnosis not present

## 2023-07-15 NOTE — Progress Notes (Signed)
 Follow up appointment  IUD  Chief Complaint  Patient presents with   Gynecologic Exam    Check on IUD    Blood pressure 105/66, pulse 77.  No results found.  Pt with IUD in place Due to her physical limitations removal is impossible in an outpatient setting and pt is asymptomatic  MEDS ordered this encounter: No orders of the defined types were placed in this encounter.   Orders for this encounter: No orders of the defined types were placed in this encounter.   Impression + Management Plan   ICD-10-CM   1. IUD check up  Z30.431       Follow Up: No follow-ups on file.     All questions were answered.  Past Medical History:  Diagnosis Date   Anemia    Arthritis    Chronic atrial fibrillation ([name])    CKD (chronic kidney disease) stage 3, GFR 30-59 ml/min ([name])    Diabetes mellitus without complication ([name])    Hyperlipidemia    Hypertension    Localized edema    Other specified disorders of thyroid    TIA (transient ischemic attack)    Unspecified convulsions ([name])     Past Surgical History:  Procedure Laterality Date   CHOLECYSTECTOMY     EXCHANGE OF A DIALYSIS CATHETER Right 11/09/2022   Procedure: EXCHANGE OF A TUNNELED DIALYSIS CATHETER;  Surgeon: Victorino Sparrow, MD;  Location: [facility] OR;  Service: Vascular;  Laterality: Right;   INSERTION OF DIALYSIS CATHETER Right 01/03/2022   Procedure: INSERTION OF DIALYSIS CATHETER;  Surgeon: Lucretia Roers, MD;  Location: [facility] ORS;  Service: General;  Laterality: Right;   INSERTION OF DIALYSIS CATHETER Left 11/09/2022   Procedure: INSERTION OF DIALYSIS CATHETER USING 19cm PALINDROME PRECISION CHRONIC CATHETER;  Surgeon: Victorino Sparrow, MD;  Location: [facility] OR;  Service: Vascular;  Laterality: Left;    OB History   No obstetric history on file.     No Known Allergies  Social History   Socioeconomic History   Marital status: Divorced    Spouse name: Not on file   Number of children: Not on file   Years of  education: Not on file   Highest education level: Not on file  Occupational History   Not on file  Tobacco Use   Smoking status: Never   Smokeless tobacco: Never  Vaping Use   Vaping status: Never Used  Substance and Sexual Activity   Alcohol use: No   Drug use: No   Sexual activity: Not Currently  Other Topics Concern   Not on file  Social History Narrative   Patient lives in [place] with her daughter.  1 level home.   Valentino Saxon daughter works 2nd shift  At a [employer] memory car unit as a Education administrator daughter is at [facility] [employer] [facility] [place] after a stay at [employer] in [place] in March 2022   Social Drivers of Health   Financial Resource Strain: Low Risk  (07/06/2022)   Overall Financial Resource Strain (CARDIA)    Difficulty of Paying Living Expenses: Not hard at all  Food Insecurity: No Food Insecurity (07/06/2022)   Hunger Vital Sign    Worried About Running Out of Food in the Last Year: Never true    Ran Out of Food in the Last Year: Never true  Transportation Needs: No Transportation Needs (07/06/2022)   PRAPARE - Transportation    Lack of Transportation (Medical): No    Lack of  Transportation (Non-Medical): No  Physical Activity: Insufficiently Active (07/06/2022)   Exercise Vital Sign    Days of Exercise per Week: 2 days    Minutes of Exercise per Session: 10 min  Stress: No Stress Concern Present (07/06/2022)   [employer] of Occupational Health - Occupational Stress Questionnaire    Feeling of Stress : Only a little  Social Connections: Socially Isolated (07/06/2022)   Social Connection and Isolation Panel [NHANES]    Frequency of Communication with Friends and Family: Three times a week    Frequency of Social Gatherings with Friends and Family: Three times a week    Attends Religious Services: Never    Active Member of Clubs or Organizations: No    Attends Banker Meetings: Never    Marital Status: Divorced    Family  History  Problem Relation Age of Onset   Stroke Mother    Stroke Maternal Grandmother

## 2023-07-17 DIAGNOSIS — N186 End stage renal disease: Secondary | ICD-10-CM | POA: Diagnosis not present

## 2023-07-17 DIAGNOSIS — Z992 Dependence on renal dialysis: Secondary | ICD-10-CM | POA: Diagnosis not present

## 2023-07-17 DIAGNOSIS — E43 Unspecified severe protein-calorie malnutrition: Secondary | ICD-10-CM | POA: Diagnosis not present

## 2023-07-18 DIAGNOSIS — E1122 Type 2 diabetes mellitus with diabetic chronic kidney disease: Secondary | ICD-10-CM | POA: Diagnosis not present

## 2023-07-18 DIAGNOSIS — D631 Anemia in chronic kidney disease: Secondary | ICD-10-CM | POA: Diagnosis not present

## 2023-07-18 DIAGNOSIS — N390 Urinary tract infection, site not specified: Secondary | ICD-10-CM | POA: Diagnosis not present

## 2023-07-18 DIAGNOSIS — I5032 Chronic diastolic (congestive) heart failure: Secondary | ICD-10-CM | POA: Diagnosis not present

## 2023-07-18 DIAGNOSIS — Z992 Dependence on renal dialysis: Secondary | ICD-10-CM | POA: Diagnosis not present

## 2023-07-18 DIAGNOSIS — K5909 Other constipation: Secondary | ICD-10-CM | POA: Diagnosis not present

## 2023-07-18 DIAGNOSIS — N186 End stage renal disease: Secondary | ICD-10-CM | POA: Diagnosis not present

## 2023-07-18 DIAGNOSIS — E559 Vitamin D deficiency, unspecified: Secondary | ICD-10-CM | POA: Diagnosis not present

## 2023-07-18 DIAGNOSIS — I700 Atherosclerosis of aorta: Secondary | ICD-10-CM | POA: Diagnosis not present

## 2023-07-18 DIAGNOSIS — E1142 Type 2 diabetes mellitus with diabetic polyneuropathy: Secondary | ICD-10-CM | POA: Diagnosis not present

## 2023-07-18 DIAGNOSIS — E785 Hyperlipidemia, unspecified: Secondary | ICD-10-CM | POA: Diagnosis not present

## 2023-07-18 DIAGNOSIS — I4891 Unspecified atrial fibrillation: Secondary | ICD-10-CM | POA: Diagnosis not present

## 2023-07-19 DIAGNOSIS — Z992 Dependence on renal dialysis: Secondary | ICD-10-CM | POA: Diagnosis not present

## 2023-07-19 DIAGNOSIS — N186 End stage renal disease: Secondary | ICD-10-CM | POA: Diagnosis not present

## 2023-07-19 DIAGNOSIS — E43 Unspecified severe protein-calorie malnutrition: Secondary | ICD-10-CM | POA: Diagnosis not present

## 2023-07-21 DIAGNOSIS — E43 Unspecified severe protein-calorie malnutrition: Secondary | ICD-10-CM | POA: Diagnosis not present

## 2023-07-21 DIAGNOSIS — N186 End stage renal disease: Secondary | ICD-10-CM | POA: Diagnosis not present

## 2023-07-23 DIAGNOSIS — B351 Tinea unguium: Secondary | ICD-10-CM | POA: Diagnosis not present

## 2023-07-24 DIAGNOSIS — N186 End stage renal disease: Secondary | ICD-10-CM | POA: Diagnosis not present

## 2023-07-24 DIAGNOSIS — E43 Unspecified severe protein-calorie malnutrition: Secondary | ICD-10-CM | POA: Diagnosis not present

## 2023-07-24 DIAGNOSIS — Z992 Dependence on renal dialysis: Secondary | ICD-10-CM | POA: Diagnosis not present

## 2023-07-26 DIAGNOSIS — E43 Unspecified severe protein-calorie malnutrition: Secondary | ICD-10-CM | POA: Diagnosis not present

## 2023-07-26 DIAGNOSIS — N186 End stage renal disease: Secondary | ICD-10-CM | POA: Diagnosis not present

## 2023-07-28 DIAGNOSIS — E43 Unspecified severe protein-calorie malnutrition: Secondary | ICD-10-CM | POA: Diagnosis not present

## 2023-07-28 DIAGNOSIS — Z992 Dependence on renal dialysis: Secondary | ICD-10-CM | POA: Diagnosis not present

## 2023-07-28 DIAGNOSIS — N186 End stage renal disease: Secondary | ICD-10-CM | POA: Diagnosis not present

## 2023-07-30 DIAGNOSIS — N186 End stage renal disease: Secondary | ICD-10-CM | POA: Diagnosis not present

## 2023-07-30 DIAGNOSIS — Z992 Dependence on renal dialysis: Secondary | ICD-10-CM | POA: Diagnosis not present

## 2023-07-31 DIAGNOSIS — E43 Unspecified severe protein-calorie malnutrition: Secondary | ICD-10-CM | POA: Diagnosis not present

## 2023-07-31 DIAGNOSIS — N186 End stage renal disease: Secondary | ICD-10-CM | POA: Diagnosis not present

## 2023-07-31 DIAGNOSIS — Z992 Dependence on renal dialysis: Secondary | ICD-10-CM | POA: Diagnosis not present

## 2023-08-02 DIAGNOSIS — Z992 Dependence on renal dialysis: Secondary | ICD-10-CM | POA: Diagnosis not present

## 2023-08-02 DIAGNOSIS — E43 Unspecified severe protein-calorie malnutrition: Secondary | ICD-10-CM | POA: Diagnosis not present

## 2023-08-02 DIAGNOSIS — N186 End stage renal disease: Secondary | ICD-10-CM | POA: Diagnosis not present

## 2023-08-04 DIAGNOSIS — Z992 Dependence on renal dialysis: Secondary | ICD-10-CM | POA: Diagnosis not present

## 2023-08-04 DIAGNOSIS — E43 Unspecified severe protein-calorie malnutrition: Secondary | ICD-10-CM | POA: Diagnosis not present

## 2023-08-04 DIAGNOSIS — N186 End stage renal disease: Secondary | ICD-10-CM | POA: Diagnosis not present

## 2023-08-06 DIAGNOSIS — Z992 Dependence on renal dialysis: Secondary | ICD-10-CM | POA: Diagnosis not present

## 2023-08-06 DIAGNOSIS — E1142 Type 2 diabetes mellitus with diabetic polyneuropathy: Secondary | ICD-10-CM | POA: Diagnosis not present

## 2023-08-06 DIAGNOSIS — I130 Hypertensive heart and chronic kidney disease with heart failure and stage 1 through stage 4 chronic kidney disease, or unspecified chronic kidney disease: Secondary | ICD-10-CM | POA: Diagnosis not present

## 2023-08-06 DIAGNOSIS — Z79899 Other long term (current) drug therapy: Secondary | ICD-10-CM | POA: Diagnosis not present

## 2023-08-06 DIAGNOSIS — N186 End stage renal disease: Secondary | ICD-10-CM | POA: Diagnosis not present

## 2023-08-07 DIAGNOSIS — N186 End stage renal disease: Secondary | ICD-10-CM | POA: Diagnosis not present

## 2023-08-07 DIAGNOSIS — E43 Unspecified severe protein-calorie malnutrition: Secondary | ICD-10-CM | POA: Diagnosis not present

## 2023-08-07 DIAGNOSIS — Z992 Dependence on renal dialysis: Secondary | ICD-10-CM | POA: Diagnosis not present

## 2023-08-09 ENCOUNTER — Ambulatory Visit: Payer: 59 | Admitting: Nurse Practitioner

## 2023-08-09 DIAGNOSIS — E43 Unspecified severe protein-calorie malnutrition: Secondary | ICD-10-CM | POA: Diagnosis not present

## 2023-08-09 DIAGNOSIS — N186 End stage renal disease: Secondary | ICD-10-CM | POA: Diagnosis not present

## 2023-08-10 ENCOUNTER — Encounter: Payer: Self-pay | Admitting: Nurse Practitioner

## 2023-08-10 ENCOUNTER — Ambulatory Visit: Payer: 59 | Attending: Nurse Practitioner | Admitting: Nurse Practitioner

## 2023-08-10 VITALS — BP 112/70 | HR 62 | Ht 69.0 in | Wt 180.0 lb

## 2023-08-10 DIAGNOSIS — Z992 Dependence on renal dialysis: Secondary | ICD-10-CM | POA: Diagnosis not present

## 2023-08-10 DIAGNOSIS — I471 Supraventricular tachycardia, unspecified: Secondary | ICD-10-CM

## 2023-08-10 DIAGNOSIS — E785 Hyperlipidemia, unspecified: Secondary | ICD-10-CM | POA: Diagnosis not present

## 2023-08-10 DIAGNOSIS — I10 Essential (primary) hypertension: Secondary | ICD-10-CM | POA: Diagnosis not present

## 2023-08-10 DIAGNOSIS — I110 Hypertensive heart disease with heart failure: Secondary | ICD-10-CM | POA: Diagnosis not present

## 2023-08-10 DIAGNOSIS — I480 Paroxysmal atrial fibrillation: Secondary | ICD-10-CM

## 2023-08-10 DIAGNOSIS — Z8673 Personal history of transient ischemic attack (TIA), and cerebral infarction without residual deficits: Secondary | ICD-10-CM

## 2023-08-10 DIAGNOSIS — N186 End stage renal disease: Secondary | ICD-10-CM | POA: Diagnosis not present

## 2023-08-10 DIAGNOSIS — I5032 Chronic diastolic (congestive) heart failure: Secondary | ICD-10-CM

## 2023-08-10 NOTE — Patient Instructions (Addendum)

## 2023-08-10 NOTE — Progress Notes (Signed)
 Cardiology Office Note:  .   Date:  08/10/2023 ID:  Chelsea Meza, DOB 1936/03/25, MRN [id number] PCP: Adella Agee, DO  [facility] Providers Cardiologist:  Armida Lander, MD    History of Present Illness: .   Chelsea Meza is a 88 y.o. female with a PMH of chronic diastolic CHF, PSVT, Paroxysmal A-fib (new onset of A-fib in 09/2021, was started on Eliquis), hx of cardiac arrest, hypertension with hx of severe LVH andn concern for [facility] on Echo in June 2023 that was new, hyperlipidemia, hyperlipidemia, history of TIA, type 2 diabetes, ESRD (receives HD), anemia of chronic disease, who presents today for scheduled follow-up. Has been followed by Palliative Care.   Previous cardiovascular history of hospital visit for SVT in setting of blood sugar reading over 800 in 2020.  Hospital mission in 2021 for hyperglycemia, was treated with IV fluids and IV insulin, noted to have severely elevated blood pressure (229/107), due to running out of medications for 2 weeks.  Sodium on admission 119, had a lactic acidosis. Because nauseated, had cardiac arrest with ROSC. Became hypotensive, pulse was lost transiently, received 1 mg of atropine and 1 mg of epinephrine IV, patient became obtunded and received CPR for 3 to 4 minutes.  Monitor revealed ventricular tachycardia, followed by bradycardia with ROSC.  Became hypotensive again, received IV fluid bolus, epi and Levophed.  ST elevation on EKG that resolved.  Her cardiac arrest etiology was unclear.  Was possible that was felt to be a PEA arrest.  Postarrest EKG showed ST changes non consistent with ACS. Echo revealed LVEF 65-70%, severe LVH, mild DD, trivial pericardial effusion, normal RV contraction.  Hospitalized in 2023 due to hypoglycemia, pt was confused prior to hospital arrival.  Noted to have hypothermia.  She was set up for dialysis due to worsening renal status.   Last seen by Theotis Flake, PA-C on October 24, 2022.  Overall  was doing well and was doing dialysis Monday Wednesday and Friday, patient stated this wore her out.  She noted some urinary burning and some blood that she believed was in her urine, was previously told by another person that it was hemorrhoids. Blood work was overall stable, Na level 131.   Today she presents for overdue follow-up. She states she is doing well.  Denies any acute cardiac complaints or issues.  She presents with her care assistant from SNF.  She is residing at [facility]  Mainly sedentary and wheelchair-bound.  Does have some intermittent leg edema at times. Denies any chest pain, shortness of breath, palpitations, syncope, presyncope, dizziness, orthopnea, PND, significant weight changes, acute bleeding, or claudication.  ROS: Negative.  See HPI.  Studies Reviewed: Aaron Aas    EKG: EKG is ordered today.  Echo 09/2021: 1. Echocardiographic features concerning for infiltrative disease such as  amyloidosis. Left ventricular ejection fraction, by estimation, is 60 to  65%. The left ventricle has normal function. The left ventricle has no  regional wall motion abnormalities.   There is severe left ventricular hypertrophy. Left ventricular diastolic  function could not be evaluated.   2. Right ventricular systolic function is normal. The right ventricular  size is normal. Moderately increased right ventricular wall thickness.  There is mildly elevated pulmonary artery systolic pressure. The estimated  right ventricular systolic pressure   is 37.2 mmHg.   3. Left atrial size was moderately dilated.   4. A small pericardial effusion is present. The pericardial effusion is  localized near  the right atrium. There is no evidence of cardiac  tamponade.   5. The mitral valve is abnormal. Trivial mitral valve regurgitation.   6. The aortic valve is tricuspid. Aortic valve regurgitation is trivial.  Aortic valve sclerosis is present, with no evidence of aortic valve  stenosis.   7.  Aortic dilatation noted. There is mild dilatation of the aortic root,  measuring 40 mm.   8. The inferior vena cava is normal in size with <50% respiratory  variability, suggesting right atrial pressure of 8 mmHg.   Comparison(s): Changes from prior study are noted. 04/05/2020: LVEF 65-70%,  severe LVH, pericardial effusion.   Conclusion(s)/Recommendation(s): Would consider evaluation for possible  cardiac amyloidosis with PYP scan and/or cMRI.  Bilateral carotid duplex 10/2017:  No evidence of significant atherosclerotic plaque or internal carotid artery stenosis. 2. Vertebral arteries are patent with antegrade flow.  Physical Exam:   VS:  BP 112/70   Pulse 62   Ht 5\' 9"  (1.753 m)   Wt 180 lb (81.6 kg)   SpO2 97%   BMI 26.58 kg/m    Wt Readings from Last 3 Encounters:  08/10/23 180 lb (81.6 kg)  11/09/22 189 lb 13.1 oz (86.1 kg)  10/24/22 190 lb (86.2 kg)    GEN: Well nourished, well developed in no acute distress, wheelchair bound NECK: No JVD; No carotid bruits CARDIAC: S1/S2, RRR, no murmurs, rubs, gallops RESPIRATORY:  Clear to auscultation without rales, wheezing or rhonchi  ABDOMEN: Soft, non-tender, non-distended EXTREMITIES:  2+ pitting edema to BLE; No deformity   ASSESSMENT AND PLAN: .    Chronic diastolic CHF Stage C, NYHA class I-II symptoms.  EF 60 to 65% in 2023. Euvolemic and well compensated on exam overall, however does appear she has some intermittent dependent edema.  Continue current GDMT.  GDMT limited due to her kidney disease. Low sodium diet, fluid restriction <2L, and daily weights encouraged. Educated to contact our office for weight gain of 2 lbs overnight or 5 lbs in one week.  Will request most recent lab work for review. Recommended compression stockings PRN for her intermittent dependent leg edema. She verbalized understanding.   PAF, PSVT Denies any tachycardia or palpitations. HR well controlled today.  Continue low-dose Eliquis and Coreg.  She  denies any bleeding issues.  She is on appropriate dosage due to her age and history of ESRD.  HTN, severe LVH BP stable and at goal. Discussed to monitor BP at home at least 2 hours after medications and sitting for 5-10 minutes.  No medication changes at this time.  Echocardiogram in 2023 showed severe LVH, some features on echo are concerning for infiltrative disease such as amyloidosis. Due to her advanced age and being asymptomatic, will not pursue further workup. Followed by Palliative Care. Will request most recent labs.   Hx of TIA Denies any symptoms. Not on aspirin due to being on Eliquis. Continue to follow with PCP.  5. HLD No recent labs on file. Will request most recent labs. Being managed by PCP. No medication changes at this time.   6. ESRD on HD Complaint with treatment sessions. Continue current medication regimen. Continue HD treatments. Will request most recent labs.     Dispo: Follow-up with Dr. Armida Lander or APP in 6 months or sooner if anything changes.   Signed, Lasalle Pointer, NP

## 2023-08-11 DIAGNOSIS — Z992 Dependence on renal dialysis: Secondary | ICD-10-CM | POA: Diagnosis not present

## 2023-08-11 DIAGNOSIS — N186 End stage renal disease: Secondary | ICD-10-CM | POA: Diagnosis not present

## 2023-08-11 DIAGNOSIS — E43 Unspecified severe protein-calorie malnutrition: Secondary | ICD-10-CM | POA: Diagnosis not present

## 2023-08-13 DIAGNOSIS — I5032 Chronic diastolic (congestive) heart failure: Secondary | ICD-10-CM | POA: Diagnosis not present

## 2023-08-13 DIAGNOSIS — Z992 Dependence on renal dialysis: Secondary | ICD-10-CM | POA: Diagnosis not present

## 2023-08-13 DIAGNOSIS — E1122 Type 2 diabetes mellitus with diabetic chronic kidney disease: Secondary | ICD-10-CM | POA: Diagnosis not present

## 2023-08-13 DIAGNOSIS — I130 Hypertensive heart and chronic kidney disease with heart failure and stage 1 through stage 4 chronic kidney disease, or unspecified chronic kidney disease: Secondary | ICD-10-CM | POA: Diagnosis not present

## 2023-08-13 DIAGNOSIS — D631 Anemia in chronic kidney disease: Secondary | ICD-10-CM | POA: Diagnosis not present

## 2023-08-13 DIAGNOSIS — Z79899 Other long term (current) drug therapy: Secondary | ICD-10-CM | POA: Diagnosis not present

## 2023-08-13 DIAGNOSIS — N186 End stage renal disease: Secondary | ICD-10-CM | POA: Diagnosis not present

## 2023-08-14 DIAGNOSIS — N186 End stage renal disease: Secondary | ICD-10-CM | POA: Diagnosis not present

## 2023-08-14 DIAGNOSIS — E113293 Type 2 diabetes mellitus with mild nonproliferative diabetic retinopathy without macular edema, bilateral: Secondary | ICD-10-CM | POA: Diagnosis not present

## 2023-08-14 DIAGNOSIS — Z992 Dependence on renal dialysis: Secondary | ICD-10-CM | POA: Diagnosis not present

## 2023-08-14 DIAGNOSIS — E43 Unspecified severe protein-calorie malnutrition: Secondary | ICD-10-CM | POA: Diagnosis not present

## 2023-08-16 DIAGNOSIS — E43 Unspecified severe protein-calorie malnutrition: Secondary | ICD-10-CM | POA: Diagnosis not present

## 2023-08-16 DIAGNOSIS — N186 End stage renal disease: Secondary | ICD-10-CM | POA: Diagnosis not present

## 2023-08-16 DIAGNOSIS — Z992 Dependence on renal dialysis: Secondary | ICD-10-CM | POA: Diagnosis not present

## 2023-08-18 DIAGNOSIS — N186 End stage renal disease: Secondary | ICD-10-CM | POA: Diagnosis not present

## 2023-08-18 DIAGNOSIS — E43 Unspecified severe protein-calorie malnutrition: Secondary | ICD-10-CM | POA: Diagnosis not present

## 2023-08-18 DIAGNOSIS — Z992 Dependence on renal dialysis: Secondary | ICD-10-CM | POA: Diagnosis not present

## 2023-08-21 DIAGNOSIS — E43 Unspecified severe protein-calorie malnutrition: Secondary | ICD-10-CM | POA: Diagnosis not present

## 2023-08-21 DIAGNOSIS — N186 End stage renal disease: Secondary | ICD-10-CM | POA: Diagnosis not present

## 2023-08-23 DIAGNOSIS — N186 End stage renal disease: Secondary | ICD-10-CM | POA: Diagnosis not present

## 2023-08-23 DIAGNOSIS — Z992 Dependence on renal dialysis: Secondary | ICD-10-CM | POA: Diagnosis not present

## 2023-08-23 DIAGNOSIS — E43 Unspecified severe protein-calorie malnutrition: Secondary | ICD-10-CM | POA: Diagnosis not present

## 2023-08-25 DIAGNOSIS — E43 Unspecified severe protein-calorie malnutrition: Secondary | ICD-10-CM | POA: Diagnosis not present

## 2023-08-25 DIAGNOSIS — N186 End stage renal disease: Secondary | ICD-10-CM | POA: Diagnosis not present

## 2023-08-27 DIAGNOSIS — Z79899 Other long term (current) drug therapy: Secondary | ICD-10-CM | POA: Diagnosis not present

## 2023-08-28 DIAGNOSIS — E43 Unspecified severe protein-calorie malnutrition: Secondary | ICD-10-CM | POA: Diagnosis not present

## 2023-08-28 DIAGNOSIS — Z992 Dependence on renal dialysis: Secondary | ICD-10-CM | POA: Diagnosis not present

## 2023-08-28 DIAGNOSIS — N186 End stage renal disease: Secondary | ICD-10-CM | POA: Diagnosis not present

## 2023-08-29 DIAGNOSIS — Z992 Dependence on renal dialysis: Secondary | ICD-10-CM | POA: Diagnosis not present

## 2023-08-29 DIAGNOSIS — N186 End stage renal disease: Secondary | ICD-10-CM | POA: Diagnosis not present

## 2023-08-30 DIAGNOSIS — E43 Unspecified severe protein-calorie malnutrition: Secondary | ICD-10-CM | POA: Diagnosis not present

## 2023-08-30 DIAGNOSIS — N186 End stage renal disease: Secondary | ICD-10-CM | POA: Diagnosis not present

## 2023-08-30 DIAGNOSIS — Z992 Dependence on renal dialysis: Secondary | ICD-10-CM | POA: Diagnosis not present

## 2023-09-01 DIAGNOSIS — N186 End stage renal disease: Secondary | ICD-10-CM | POA: Diagnosis not present

## 2023-09-01 DIAGNOSIS — E43 Unspecified severe protein-calorie malnutrition: Secondary | ICD-10-CM | POA: Diagnosis not present

## 2023-09-03 DIAGNOSIS — E113493 Type 2 diabetes mellitus with severe nonproliferative diabetic retinopathy without macular edema, bilateral: Secondary | ICD-10-CM | POA: Diagnosis not present

## 2023-09-03 DIAGNOSIS — Z961 Presence of intraocular lens: Secondary | ICD-10-CM | POA: Diagnosis not present

## 2023-09-03 DIAGNOSIS — H25811 Combined forms of age-related cataract, right eye: Secondary | ICD-10-CM | POA: Diagnosis not present

## 2023-09-04 DIAGNOSIS — N186 End stage renal disease: Secondary | ICD-10-CM | POA: Diagnosis not present

## 2023-09-04 DIAGNOSIS — E43 Unspecified severe protein-calorie malnutrition: Secondary | ICD-10-CM | POA: Diagnosis not present

## 2023-09-04 DIAGNOSIS — Z992 Dependence on renal dialysis: Secondary | ICD-10-CM | POA: Diagnosis not present

## 2023-09-05 DIAGNOSIS — I132 Hypertensive heart and chronic kidney disease with heart failure and with stage 5 chronic kidney disease, or end stage renal disease: Secondary | ICD-10-CM | POA: Diagnosis not present

## 2023-09-05 DIAGNOSIS — M6281 Muscle weakness (generalized): Secondary | ICD-10-CM | POA: Diagnosis not present

## 2023-09-06 DIAGNOSIS — N186 End stage renal disease: Secondary | ICD-10-CM | POA: Diagnosis not present

## 2023-09-06 DIAGNOSIS — E43 Unspecified severe protein-calorie malnutrition: Secondary | ICD-10-CM | POA: Diagnosis not present

## 2023-09-06 DIAGNOSIS — I132 Hypertensive heart and chronic kidney disease with heart failure and with stage 5 chronic kidney disease, or end stage renal disease: Secondary | ICD-10-CM | POA: Diagnosis not present

## 2023-09-06 DIAGNOSIS — Z992 Dependence on renal dialysis: Secondary | ICD-10-CM | POA: Diagnosis not present

## 2023-09-06 DIAGNOSIS — M6281 Muscle weakness (generalized): Secondary | ICD-10-CM | POA: Diagnosis not present

## 2023-09-07 DIAGNOSIS — M6281 Muscle weakness (generalized): Secondary | ICD-10-CM | POA: Diagnosis not present

## 2023-09-07 DIAGNOSIS — I132 Hypertensive heart and chronic kidney disease with heart failure and with stage 5 chronic kidney disease, or end stage renal disease: Secondary | ICD-10-CM | POA: Diagnosis not present

## 2023-09-08 DIAGNOSIS — E43 Unspecified severe protein-calorie malnutrition: Secondary | ICD-10-CM | POA: Diagnosis not present

## 2023-09-08 DIAGNOSIS — N186 End stage renal disease: Secondary | ICD-10-CM | POA: Diagnosis not present

## 2023-09-10 ENCOUNTER — Other Ambulatory Visit: Payer: Self-pay | Admitting: Nurse Practitioner

## 2023-09-10 DIAGNOSIS — M6281 Muscle weakness (generalized): Secondary | ICD-10-CM | POA: Diagnosis not present

## 2023-09-10 DIAGNOSIS — I132 Hypertensive heart and chronic kidney disease with heart failure and with stage 5 chronic kidney disease, or end stage renal disease: Secondary | ICD-10-CM | POA: Diagnosis not present

## 2023-09-10 DIAGNOSIS — E875 Hyperkalemia: Secondary | ICD-10-CM

## 2023-09-10 DIAGNOSIS — N179 Acute kidney failure, unspecified: Secondary | ICD-10-CM

## 2023-09-11 DIAGNOSIS — N186 End stage renal disease: Secondary | ICD-10-CM | POA: Diagnosis not present

## 2023-09-11 DIAGNOSIS — E875 Hyperkalemia: Secondary | ICD-10-CM | POA: Diagnosis not present

## 2023-09-11 DIAGNOSIS — N189 Chronic kidney disease, unspecified: Secondary | ICD-10-CM | POA: Diagnosis not present

## 2023-09-11 DIAGNOSIS — I132 Hypertensive heart and chronic kidney disease with heart failure and with stage 5 chronic kidney disease, or end stage renal disease: Secondary | ICD-10-CM | POA: Diagnosis not present

## 2023-09-11 DIAGNOSIS — Z992 Dependence on renal dialysis: Secondary | ICD-10-CM | POA: Diagnosis not present

## 2023-09-11 DIAGNOSIS — E43 Unspecified severe protein-calorie malnutrition: Secondary | ICD-10-CM | POA: Diagnosis not present

## 2023-09-11 DIAGNOSIS — M6281 Muscle weakness (generalized): Secondary | ICD-10-CM | POA: Diagnosis not present

## 2023-09-11 DIAGNOSIS — N179 Acute kidney failure, unspecified: Secondary | ICD-10-CM | POA: Diagnosis not present

## 2023-09-12 DIAGNOSIS — I132 Hypertensive heart and chronic kidney disease with heart failure and with stage 5 chronic kidney disease, or end stage renal disease: Secondary | ICD-10-CM | POA: Diagnosis not present

## 2023-09-12 DIAGNOSIS — M6281 Muscle weakness (generalized): Secondary | ICD-10-CM | POA: Diagnosis not present

## 2023-09-13 DIAGNOSIS — E43 Unspecified severe protein-calorie malnutrition: Secondary | ICD-10-CM | POA: Diagnosis not present

## 2023-09-13 DIAGNOSIS — N186 End stage renal disease: Secondary | ICD-10-CM | POA: Diagnosis not present

## 2023-09-13 DIAGNOSIS — Z992 Dependence on renal dialysis: Secondary | ICD-10-CM | POA: Diagnosis not present

## 2023-09-15 DIAGNOSIS — E43 Unspecified severe protein-calorie malnutrition: Secondary | ICD-10-CM | POA: Diagnosis not present

## 2023-09-15 DIAGNOSIS — N186 End stage renal disease: Secondary | ICD-10-CM | POA: Diagnosis not present

## 2023-09-17 DIAGNOSIS — M6281 Muscle weakness (generalized): Secondary | ICD-10-CM | POA: Diagnosis not present

## 2023-09-17 DIAGNOSIS — R509 Fever, unspecified: Secondary | ICD-10-CM | POA: Diagnosis not present

## 2023-09-17 DIAGNOSIS — D649 Anemia, unspecified: Secondary | ICD-10-CM | POA: Diagnosis not present

## 2023-09-17 DIAGNOSIS — I132 Hypertensive heart and chronic kidney disease with heart failure and with stage 5 chronic kidney disease, or end stage renal disease: Secondary | ICD-10-CM | POA: Diagnosis not present

## 2023-09-17 DIAGNOSIS — R059 Cough, unspecified: Secondary | ICD-10-CM | POA: Diagnosis not present

## 2023-09-17 DIAGNOSIS — R0989 Other specified symptoms and signs involving the circulatory and respiratory systems: Secondary | ICD-10-CM | POA: Diagnosis not present

## 2023-09-18 DIAGNOSIS — R509 Fever, unspecified: Secondary | ICD-10-CM | POA: Diagnosis not present

## 2023-09-18 DIAGNOSIS — E43 Unspecified severe protein-calorie malnutrition: Secondary | ICD-10-CM | POA: Diagnosis not present

## 2023-09-18 DIAGNOSIS — Z992 Dependence on renal dialysis: Secondary | ICD-10-CM | POA: Diagnosis not present

## 2023-09-18 DIAGNOSIS — D72829 Elevated white blood cell count, unspecified: Secondary | ICD-10-CM | POA: Diagnosis not present

## 2023-09-18 DIAGNOSIS — N186 End stage renal disease: Secondary | ICD-10-CM | POA: Diagnosis not present

## 2023-09-18 DIAGNOSIS — Z79899 Other long term (current) drug therapy: Secondary | ICD-10-CM | POA: Diagnosis not present

## 2023-09-19 DIAGNOSIS — M6281 Muscle weakness (generalized): Secondary | ICD-10-CM | POA: Diagnosis not present

## 2023-09-19 DIAGNOSIS — I132 Hypertensive heart and chronic kidney disease with heart failure and with stage 5 chronic kidney disease, or end stage renal disease: Secondary | ICD-10-CM | POA: Diagnosis not present

## 2023-09-19 DIAGNOSIS — N390 Urinary tract infection, site not specified: Secondary | ICD-10-CM | POA: Diagnosis not present

## 2023-09-20 DIAGNOSIS — E43 Unspecified severe protein-calorie malnutrition: Secondary | ICD-10-CM | POA: Diagnosis not present

## 2023-09-20 DIAGNOSIS — N186 End stage renal disease: Secondary | ICD-10-CM | POA: Diagnosis not present

## 2023-09-21 DIAGNOSIS — I132 Hypertensive heart and chronic kidney disease with heart failure and with stage 5 chronic kidney disease, or end stage renal disease: Secondary | ICD-10-CM | POA: Diagnosis not present

## 2023-09-21 DIAGNOSIS — M6281 Muscle weakness (generalized): Secondary | ICD-10-CM | POA: Diagnosis not present

## 2023-09-22 DIAGNOSIS — N186 End stage renal disease: Secondary | ICD-10-CM | POA: Diagnosis not present

## 2023-09-22 DIAGNOSIS — Z992 Dependence on renal dialysis: Secondary | ICD-10-CM | POA: Diagnosis not present

## 2023-09-22 DIAGNOSIS — E43 Unspecified severe protein-calorie malnutrition: Secondary | ICD-10-CM | POA: Diagnosis not present

## 2023-09-24 ENCOUNTER — Ambulatory Visit: Payer: Self-pay | Admitting: Nurse Practitioner

## 2023-09-24 DIAGNOSIS — Z992 Dependence on renal dialysis: Secondary | ICD-10-CM | POA: Diagnosis not present

## 2023-09-24 DIAGNOSIS — N186 End stage renal disease: Secondary | ICD-10-CM | POA: Diagnosis not present

## 2023-09-24 DIAGNOSIS — I130 Hypertensive heart and chronic kidney disease with heart failure and stage 1 through stage 4 chronic kidney disease, or unspecified chronic kidney disease: Secondary | ICD-10-CM | POA: Diagnosis not present

## 2023-09-24 DIAGNOSIS — D631 Anemia in chronic kidney disease: Secondary | ICD-10-CM | POA: Diagnosis not present

## 2023-09-24 DIAGNOSIS — I5032 Chronic diastolic (congestive) heart failure: Secondary | ICD-10-CM | POA: Diagnosis not present

## 2023-09-24 DIAGNOSIS — M6281 Muscle weakness (generalized): Secondary | ICD-10-CM | POA: Diagnosis not present

## 2023-09-24 DIAGNOSIS — I132 Hypertensive heart and chronic kidney disease with heart failure and with stage 5 chronic kidney disease, or end stage renal disease: Secondary | ICD-10-CM | POA: Diagnosis not present

## 2023-09-24 DIAGNOSIS — Z79899 Other long term (current) drug therapy: Secondary | ICD-10-CM | POA: Diagnosis not present

## 2023-09-25 DIAGNOSIS — E43 Unspecified severe protein-calorie malnutrition: Secondary | ICD-10-CM | POA: Diagnosis not present

## 2023-09-25 DIAGNOSIS — Z992 Dependence on renal dialysis: Secondary | ICD-10-CM | POA: Diagnosis not present

## 2023-09-25 DIAGNOSIS — N186 End stage renal disease: Secondary | ICD-10-CM | POA: Diagnosis not present

## 2023-09-27 DIAGNOSIS — N186 End stage renal disease: Secondary | ICD-10-CM | POA: Diagnosis not present

## 2023-09-27 DIAGNOSIS — Z992 Dependence on renal dialysis: Secondary | ICD-10-CM | POA: Diagnosis not present

## 2023-09-27 DIAGNOSIS — E43 Unspecified severe protein-calorie malnutrition: Secondary | ICD-10-CM | POA: Diagnosis not present

## 2023-09-29 DIAGNOSIS — N186 End stage renal disease: Secondary | ICD-10-CM | POA: Diagnosis not present

## 2023-09-29 DIAGNOSIS — Z992 Dependence on renal dialysis: Secondary | ICD-10-CM | POA: Diagnosis not present

## 2023-09-29 DIAGNOSIS — E43 Unspecified severe protein-calorie malnutrition: Secondary | ICD-10-CM | POA: Diagnosis not present

## 2023-10-02 DIAGNOSIS — E43 Unspecified severe protein-calorie malnutrition: Secondary | ICD-10-CM | POA: Diagnosis not present

## 2023-10-02 DIAGNOSIS — Z992 Dependence on renal dialysis: Secondary | ICD-10-CM | POA: Diagnosis not present

## 2023-10-02 DIAGNOSIS — N186 End stage renal disease: Secondary | ICD-10-CM | POA: Diagnosis not present

## 2023-10-04 DIAGNOSIS — E43 Unspecified severe protein-calorie malnutrition: Secondary | ICD-10-CM | POA: Diagnosis not present

## 2023-10-04 DIAGNOSIS — N186 End stage renal disease: Secondary | ICD-10-CM | POA: Diagnosis not present

## 2023-10-04 DIAGNOSIS — Z992 Dependence on renal dialysis: Secondary | ICD-10-CM | POA: Diagnosis not present

## 2023-10-06 DIAGNOSIS — E43 Unspecified severe protein-calorie malnutrition: Secondary | ICD-10-CM | POA: Diagnosis not present

## 2023-10-06 DIAGNOSIS — N186 End stage renal disease: Secondary | ICD-10-CM | POA: Diagnosis not present

## 2023-10-08 DIAGNOSIS — Z992 Dependence on renal dialysis: Secondary | ICD-10-CM | POA: Diagnosis not present

## 2023-10-08 DIAGNOSIS — N186 End stage renal disease: Secondary | ICD-10-CM | POA: Diagnosis not present

## 2023-10-09 DIAGNOSIS — Z992 Dependence on renal dialysis: Secondary | ICD-10-CM | POA: Diagnosis not present

## 2023-10-09 DIAGNOSIS — N186 End stage renal disease: Secondary | ICD-10-CM | POA: Diagnosis not present

## 2023-10-09 DIAGNOSIS — E43 Unspecified severe protein-calorie malnutrition: Secondary | ICD-10-CM | POA: Diagnosis not present

## 2023-10-10 DIAGNOSIS — N186 End stage renal disease: Secondary | ICD-10-CM | POA: Diagnosis not present

## 2023-10-10 DIAGNOSIS — Z992 Dependence on renal dialysis: Secondary | ICD-10-CM | POA: Diagnosis not present

## 2023-10-10 DIAGNOSIS — I4891 Unspecified atrial fibrillation: Secondary | ICD-10-CM | POA: Diagnosis not present

## 2023-10-10 DIAGNOSIS — I5032 Chronic diastolic (congestive) heart failure: Secondary | ICD-10-CM | POA: Diagnosis not present

## 2023-10-10 DIAGNOSIS — I700 Atherosclerosis of aorta: Secondary | ICD-10-CM | POA: Diagnosis not present

## 2023-10-10 DIAGNOSIS — I130 Hypertensive heart and chronic kidney disease with heart failure and stage 1 through stage 4 chronic kidney disease, or unspecified chronic kidney disease: Secondary | ICD-10-CM | POA: Diagnosis not present

## 2023-10-10 DIAGNOSIS — E1122 Type 2 diabetes mellitus with diabetic chronic kidney disease: Secondary | ICD-10-CM | POA: Diagnosis not present

## 2023-10-10 DIAGNOSIS — E1142 Type 2 diabetes mellitus with diabetic polyneuropathy: Secondary | ICD-10-CM | POA: Diagnosis not present

## 2023-10-10 DIAGNOSIS — D631 Anemia in chronic kidney disease: Secondary | ICD-10-CM | POA: Diagnosis not present

## 2023-10-10 DIAGNOSIS — E785 Hyperlipidemia, unspecified: Secondary | ICD-10-CM | POA: Diagnosis not present

## 2023-10-11 DIAGNOSIS — N186 End stage renal disease: Secondary | ICD-10-CM | POA: Diagnosis not present

## 2023-10-11 DIAGNOSIS — E43 Unspecified severe protein-calorie malnutrition: Secondary | ICD-10-CM | POA: Diagnosis not present

## 2023-10-13 DIAGNOSIS — N186 End stage renal disease: Secondary | ICD-10-CM | POA: Diagnosis not present

## 2023-10-13 DIAGNOSIS — E43 Unspecified severe protein-calorie malnutrition: Secondary | ICD-10-CM | POA: Diagnosis not present

## 2023-10-16 DIAGNOSIS — Z992 Dependence on renal dialysis: Secondary | ICD-10-CM | POA: Diagnosis not present

## 2023-10-16 DIAGNOSIS — N186 End stage renal disease: Secondary | ICD-10-CM | POA: Diagnosis not present

## 2023-10-16 DIAGNOSIS — E43 Unspecified severe protein-calorie malnutrition: Secondary | ICD-10-CM | POA: Diagnosis not present

## 2023-10-18 DIAGNOSIS — N186 End stage renal disease: Secondary | ICD-10-CM | POA: Diagnosis not present

## 2023-10-18 DIAGNOSIS — Z992 Dependence on renal dialysis: Secondary | ICD-10-CM | POA: Diagnosis not present

## 2023-10-18 DIAGNOSIS — E43 Unspecified severe protein-calorie malnutrition: Secondary | ICD-10-CM | POA: Diagnosis not present

## 2023-10-20 DIAGNOSIS — N186 End stage renal disease: Secondary | ICD-10-CM | POA: Diagnosis not present

## 2023-10-20 DIAGNOSIS — E43 Unspecified severe protein-calorie malnutrition: Secondary | ICD-10-CM | POA: Diagnosis not present

## 2023-10-23 DIAGNOSIS — E43 Unspecified severe protein-calorie malnutrition: Secondary | ICD-10-CM | POA: Diagnosis not present

## 2023-10-23 DIAGNOSIS — N186 End stage renal disease: Secondary | ICD-10-CM | POA: Diagnosis not present

## 2023-10-23 DIAGNOSIS — Z992 Dependence on renal dialysis: Secondary | ICD-10-CM | POA: Diagnosis not present

## 2023-10-25 DIAGNOSIS — Z992 Dependence on renal dialysis: Secondary | ICD-10-CM | POA: Diagnosis not present

## 2023-10-25 DIAGNOSIS — N186 End stage renal disease: Secondary | ICD-10-CM | POA: Diagnosis not present

## 2023-10-25 DIAGNOSIS — E43 Unspecified severe protein-calorie malnutrition: Secondary | ICD-10-CM | POA: Diagnosis not present

## 2023-10-27 DIAGNOSIS — E43 Unspecified severe protein-calorie malnutrition: Secondary | ICD-10-CM | POA: Diagnosis not present

## 2023-10-27 DIAGNOSIS — Z992 Dependence on renal dialysis: Secondary | ICD-10-CM | POA: Diagnosis not present

## 2023-10-27 DIAGNOSIS — N186 End stage renal disease: Secondary | ICD-10-CM | POA: Diagnosis not present

## 2023-10-29 DIAGNOSIS — Z992 Dependence on renal dialysis: Secondary | ICD-10-CM | POA: Diagnosis not present

## 2023-10-29 DIAGNOSIS — N186 End stage renal disease: Secondary | ICD-10-CM | POA: Diagnosis not present

## 2023-10-30 DIAGNOSIS — Z992 Dependence on renal dialysis: Secondary | ICD-10-CM | POA: Diagnosis not present

## 2023-10-30 DIAGNOSIS — E43 Unspecified severe protein-calorie malnutrition: Secondary | ICD-10-CM | POA: Diagnosis not present

## 2023-10-30 DIAGNOSIS — N186 End stage renal disease: Secondary | ICD-10-CM | POA: Diagnosis not present

## 2023-11-01 ENCOUNTER — Encounter (HOSPITAL_COMMUNITY): Payer: Self-pay | Admitting: Emergency Medicine

## 2023-11-01 ENCOUNTER — Emergency Department (HOSPITAL_COMMUNITY)

## 2023-11-01 ENCOUNTER — Emergency Department (HOSPITAL_COMMUNITY)
Admission: EM | Admit: 2023-11-01 | Discharge: 2023-11-01 | Disposition: A | Discharge status: Skilled Nursing Facility | Source: Other Acute Inpatient Hospital | Attending: Emergency Medicine | Admitting: Emergency Medicine

## 2023-11-01 ENCOUNTER — Other Ambulatory Visit: Payer: Self-pay

## 2023-11-01 DIAGNOSIS — K5641 Fecal impaction: Secondary | ICD-10-CM | POA: Insufficient documentation

## 2023-11-01 DIAGNOSIS — I517 Cardiomegaly: Secondary | ICD-10-CM | POA: Diagnosis not present

## 2023-11-01 DIAGNOSIS — E1122 Type 2 diabetes mellitus with diabetic chronic kidney disease: Secondary | ICD-10-CM | POA: Diagnosis not present

## 2023-11-01 DIAGNOSIS — I120 Hypertensive chronic kidney disease with stage 5 chronic kidney disease or end stage renal disease: Secondary | ICD-10-CM | POA: Insufficient documentation

## 2023-11-01 DIAGNOSIS — K5649 Other impaction of intestine: Secondary | ICD-10-CM | POA: Diagnosis not present

## 2023-11-01 DIAGNOSIS — R609 Edema, unspecified: Secondary | ICD-10-CM | POA: Diagnosis not present

## 2023-11-01 DIAGNOSIS — R110 Nausea: Secondary | ICD-10-CM | POA: Insufficient documentation

## 2023-11-01 DIAGNOSIS — Z7901 Long term (current) use of anticoagulants: Secondary | ICD-10-CM | POA: Insufficient documentation

## 2023-11-01 DIAGNOSIS — M7989 Other specified soft tissue disorders: Secondary | ICD-10-CM | POA: Diagnosis not present

## 2023-11-01 DIAGNOSIS — Z79899 Other long term (current) drug therapy: Secondary | ICD-10-CM | POA: Insufficient documentation

## 2023-11-01 DIAGNOSIS — Z992 Dependence on renal dialysis: Secondary | ICD-10-CM | POA: Diagnosis not present

## 2023-11-01 DIAGNOSIS — R0989 Other specified symptoms and signs involving the circulatory and respiratory systems: Secondary | ICD-10-CM | POA: Diagnosis not present

## 2023-11-01 DIAGNOSIS — N186 End stage renal disease: Secondary | ICD-10-CM | POA: Diagnosis not present

## 2023-11-01 DIAGNOSIS — Z7401 Bed confinement status: Secondary | ICD-10-CM | POA: Diagnosis not present

## 2023-11-01 DIAGNOSIS — I10 Essential (primary) hypertension: Secondary | ICD-10-CM | POA: Diagnosis not present

## 2023-11-01 DIAGNOSIS — R29898 Other symptoms and signs involving the musculoskeletal system: Secondary | ICD-10-CM | POA: Diagnosis not present

## 2023-11-01 DIAGNOSIS — E43 Unspecified severe protein-calorie malnutrition: Secondary | ICD-10-CM | POA: Diagnosis not present

## 2023-11-01 LAB — CBC WITH DIFFERENTIAL/PLATELET
Abs Immature Granulocytes: 0.15 10*3/uL — ABNORMAL HIGH (ref 0.00–0.07)
Basophils Absolute: 0 10*3/uL (ref 0.0–0.1)
Basophils Relative: 0 %
Eosinophils Absolute: 0 10*3/uL (ref 0.0–0.5)
Eosinophils Relative: 0 %
HCT: 37.5 % (ref 36.0–46.0)
Hemoglobin: 11.7 g/dL — ABNORMAL LOW (ref 12.0–15.0)
Immature Granulocytes: 1 %
Lymphocytes Relative: 5 %
Lymphs Abs: 1.1 10*3/uL (ref 0.7–4.0)
MCH: 28.3 pg (ref 26.0–34.0)
MCHC: 31.2 g/dL (ref 30.0–36.0)
MCV: 90.6 fL (ref 80.0–100.0)
Monocytes Absolute: 0.4 10*3/uL (ref 0.1–1.0)
Monocytes Relative: 2 %
Neutro Abs: 18.7 10*3/uL — ABNORMAL HIGH (ref 1.7–7.7)
Neutrophils Relative %: 92 %
Platelets: 229 10*3/uL (ref 150–400)
RBC: 4.14 MIL/uL (ref 3.87–5.11)
RDW: 17.6 % — ABNORMAL HIGH (ref 11.5–15.5)
WBC: 20.3 10*3/uL — ABNORMAL HIGH (ref 4.0–10.5)
nRBC: 0 % (ref 0.0–0.2)

## 2023-11-01 LAB — COMPREHENSIVE METABOLIC PANEL WITH GFR
ALT: 11 U/L (ref 0–44)
AST: 13 U/L — ABNORMAL LOW (ref 15–41)
Albumin: 3.3 g/dL — ABNORMAL LOW (ref 3.5–5.0)
Alkaline Phosphatase: 101 U/L (ref 38–126)
Anion gap: 16 — ABNORMAL HIGH (ref 5–15)
BUN: 73 mg/dL — ABNORMAL HIGH (ref 8–23)
CO2: 21 mmol/L — ABNORMAL LOW (ref 22–32)
Calcium: 9.2 mg/dL (ref 8.9–10.3)
Chloride: 100 mmol/L (ref 98–111)
Creatinine, Ser: 7.56 mg/dL — ABNORMAL HIGH (ref 0.44–1.00)
GFR, Estimated: 5 mL/min — ABNORMAL LOW (ref >60)
Glucose, Bld: 209 mg/dL — ABNORMAL HIGH (ref 70–99)
Potassium: 4.3 mmol/L (ref 3.5–5.1)
Sodium: 137 mmol/L (ref 135–145)
Total Bilirubin: 0.9 mg/dL (ref 0.0–1.2)
Total Protein: 7.4 g/dL (ref 6.5–8.1)

## 2023-11-01 LAB — MAGNESIUM: Magnesium: 2 mg/dL (ref 1.7–2.4)

## 2023-11-01 LAB — LIPASE, BLOOD: Lipase: 31 U/L (ref 11–51)

## 2023-11-01 MED ORDER — ONDANSETRON HCL 4 MG/2ML IJ SOLN
4.0000 mg | Freq: Once | INTRAMUSCULAR | Status: AC
  Administered 2023-11-01: 4 mg via INTRAVENOUS
  Filled 2023-11-01: qty 2

## 2023-11-01 MED ORDER — FLEET ENEMA RE ENEM
1.0000 | ENEMA | Freq: Once | RECTAL | Status: AC
  Administered 2023-11-01: 1 via RECTAL

## 2023-11-01 NOTE — ED Notes (Signed)
 Patient had a very large bowel movement, patient was cleaned and brief changed

## 2023-11-01 NOTE — ED Triage Notes (Signed)
 Pt coming from dialysis via [facility] with reports of weakness, leg swelling, and not feeling well. Pt reports she did not get her dialysis today because I did not feel well.

## 2023-11-01 NOTE — ED Notes (Signed)
 Daughter Annita Ratliff would like to be updated, please call her at [phone]

## 2023-11-01 NOTE — ED Notes (Signed)
 [facility] called to transport patient back to facility. Paramedic aware

## 2023-11-01 NOTE — ED Notes (Signed)
 Pt given the phone to talk to a relative

## 2023-11-01 NOTE — ED Notes (Signed)
 Patient had another large BM, brief changed, patient is eating dinner and resting comfortably.SABRA

## 2023-11-01 NOTE — ED Provider Notes (Addendum)
 [place] EMERGENCY DEPARTMENT AT [facility] Provider Note   CSN: [id number] Arrival date & time: 11/01/23  1038     Patient presents with: Weakness   Chelsea Meza is a 88 y.o. female.   Pt is a 88 yo female with pmhx significant for DM, HTN, HLD, ESRD on HD, Anemia, afib (on Eliquis ), and arthritis.  Pt has had diarrhea for several days and has missed dialysis due to this.  She felt nauseous today, so came to the ED.       Prior to Admission medications   Medication Sig Start Date End Date Taking? Authorizing Provider  acetaminophen  (TYLENOL ) 325 MG tablet Take 2 tablets (650 mg total) by mouth every 6 (six) hours as needed for mild pain, fever or headache (or Fever >/= 101). 01/08/22   Pearlean Manus, MD  Amino Acids-Protein Hydrolys (FEEDING SUPPLEMENT, PRO-STAT SUGAR FREE 64,) LIQD Take 30 mLs by mouth in the morning, at noon, and at bedtime. (0600,0900 & 1600)    [provider]  amLODipine  (NORVASC ) 5 MG tablet Take 5 mg by mouth daily. 09/22/23   [provider]  apixaban  (ELIQUIS ) 2.5 MG TABS tablet Take 1 tablet (2.5 mg total) by mouth 2 (two) times daily. 04/13/22 11/08/26  Kommor, Madison, MD  atorvastatin  (LIPITOR) 40 MG tablet Take 40 mg by mouth at bedtime. (2100) 10/19/21   [provider]  Blood Glucose Monitoring Suppl (ACCU-CHEK GUIDE) w/Device KIT USE AS DIRECTED.I 09/03/20   [provider]  carvedilol  (COREG ) 3.125 MG tablet Take 1 tablet (3.125 mg total) by mouth 2 (two) times daily with a meal. 01/08/22 11/08/26  Pearlean Manus, MD  Cholecalciferol (VITAMIN D3 SUPER STRENGTH) 50 MCG (2000 UT) TABS Take 2,000 Units by mouth in the morning. (0900)    [provider]  fosfomycin (MONUROL) 3 g PACK Take 3 g by mouth every 3 (three) days. (0900)    [provider]  furosemide  (LASIX ) 40 MG tablet Take 1 tablet (40 mg total) by mouth daily. 01/08/22   Pearlean Manus, MD  gabapentin (NEURONTIN) 100 MG  capsule Take 100 mg by mouth every evening. (2000)    [provider]  hydrALAZINE  (APRESOLINE ) 50 MG tablet Take 50 mg by mouth 3 (three) times daily. 09/22/23   [provider]  Hydrocortisone (ANUSOL-HC EX) Apply topically.    [provider]  insulin  lispro (HUMALOG ) 100 UNIT/ML injection Inject 4 Units into the skin with breakfast, with lunch, and with evening meal. Hold for FSBS less than 150 or if not eating 01/09/22   [provider]  ipratropium-albuterol  (DUONEB) 0.5-2.5 (3) MG/3ML SOLN Inhale 3 mLs into the lungs every 4 (four) hours as needed (wheezing). 07/04/23   [provider]  MELATONIN PO Take 6 mg by mouth at bedtime. (2200)    [provider]  Multiple Vitamin (MULTIVITAMIN WITH MINERALS) TABS tablet Take 1 tablet by mouth in the morning. (0800)    [provider]  polyethylene glycol (MIRALAX  / GLYCOLAX ) 17 g packet Take 17 g by mouth 2 (two) times a week. Tuesdays & Saturdays (0900)    [provider]  senna-docusate (SENOKOT-S) 8.6-50 MG tablet Take 1 tablet by mouth daily. Patient taking differently: Take 1 tablet by mouth every evening. (1800) 04/15/22   Garrick Charleston, MD  sevelamer carbonate (RENVELA) 800 MG tablet Take 800 mg by mouth 3 (three) times daily with meals.    [provider]  sodium zirconium cyclosilicate  (LOKELMA ) 10  g PACK packet Take 10 g by mouth every Monday, Wednesday, and Friday. 12/26/21   Evonnie Lenis, MD    Allergies: Patient has no known allergies.    Review of Systems  Gastrointestinal:  Positive for diarrhea and nausea.  All other systems reviewed and are negative.   Updated Vital Signs BP (!) 171/101   Pulse (!) 104   Temp 98.6 F (37 C) (Oral)   Resp 18   SpO2 95%   Physical Exam Vitals and nursing note reviewed.  Constitutional:      Appearance: Normal appearance. She is obese.  HENT:     Head: Normocephalic and atraumatic.     Right Ear: External ear  normal.     Left Ear: External ear normal.     Nose: Nose normal.     Mouth/Throat:     Mouth: Mucous membranes are moist.     Pharynx: Oropharynx is clear.  Eyes:     Extraocular Movements: Extraocular movements intact.     Conjunctiva/sclera: Conjunctivae normal.     Pupils: Pupils are equal, round, and reactive to light.  Cardiovascular:     Rate and Rhythm: Regular rhythm. Tachycardia present.     Pulses: Normal pulses.     Heart sounds: Normal heart sounds.  Pulmonary:     Effort: Pulmonary effort is normal.     Breath sounds: Normal breath sounds.  Chest:     Comments: Tunneled cath left upper chest Abdominal:     General: Abdomen is flat. Bowel sounds are normal.     Palpations: Abdomen is soft.  Genitourinary:    Comments: Large amt of soft stool in rectum Musculoskeletal:        General: Normal range of motion.     Cervical back: Normal range of motion and neck supple.  Skin:    General: Skin is warm.     Capillary Refill: Capillary refill takes less than 2 seconds.  Neurological:     General: No focal deficit present.     Mental Status: She is alert and oriented to person, place, and time.  Psychiatric:        Mood and Affect: Mood normal.        Behavior: Behavior normal.     (all labs ordered are listed, but only abnormal results are displayed) Labs Reviewed  CBC WITH DIFFERENTIAL/PLATELET - Abnormal; Notable for the following components:      Result Value   WBC 20.3 (*)    Hemoglobin 11.7 (*)    RDW 17.6 (*)    Neutro Abs 18.7 (*)    Abs Immature Granulocytes 0.15 (*)    All other components within normal limits  COMPREHENSIVE METABOLIC PANEL WITH GFR - Abnormal; Notable for the following components:   CO2 21 (*)    Glucose, Bld 209 (*)    BUN 73 (*)    Creatinine, Ser 7.56 (*)    Albumin 3.3 (*)    AST 13 (*)    GFR, Estimated 5 (*)    Anion gap 16 (*)    All other components within normal limits  LIPASE, BLOOD  MAGNESIUM   URINALYSIS, ROUTINE  W REFLEX MICROSCOPIC    EKG: EKG Interpretation Date/Time:  Thursday November 01 2023 11:19:33 EDT Ventricular Rate:  102 PR Interval:  162 QRS Duration:  150 QT Interval:  406 QTC Calculation: 529 R Axis:   -34  Text Interpretation: Sinus tachycardia Left bundle branch block No significant change since last tracing Confirmed by  Dean Clarity ([id number]) on 11/01/2023 12:56:35 PM  Radiology: CT ABDOMEN PELVIS WO CONTRAST Result Date: 11/01/2023 CLINICAL DATA:  Abdominal pain.  Acute nonlocalized abdominal pain EXAM: CT ABDOMEN AND PELVIS WITHOUT CONTRAST TECHNIQUE: Multidetector CT imaging of the abdomen and pelvis was performed following the standard protocol without IV contrast. RADIATION DOSE REDUCTION: This exam was performed according to the departmental dose-optimization program which includes automated exposure control, adjustment of the mA and/or kV according to patient size and/or use of iterative reconstruction technique. COMPARISON:  CT 12/11/2022 FINDINGS: Lower chest: Lung bases are clear. Hepatobiliary: No focal hepatic lesion. Postcholecystectomy. No biliary dilatation. Pancreas: Pancreas is normal. No ductal dilatation. No pancreatic inflammation. Spleen: Normal spleen Adrenals/urinary tract: Adrenal glands and kidneys are normal. The ureters and bladder normal. Stomach/Bowel: The stomach, duodenum, and small bowel normal. Ascending and transverse colon normal.  Descending colon normal. There is a large volume stool expanding the rectal vault. The rectal vault measures 9.8 x 11.4 cm in axial dimension and the stool impaction measures 17 cm in craniocaudad dimension. Vascular/Lymphatic: Abdominal aorta is normal caliber. No periportal or retroperitoneal adenopathy. No pelvic adenopathy. Reproductive: Trace amount gas in the uterine fundus. IUD device noted within the uterus. Other: No free fluid. Musculoskeletal: No aggressive osseous lesion. IMPRESSION: 1. Large volume stool expanding the  rectal vault most consistent with fecal impaction. 2. Trace amount gas in the uterine fundus is nonspecific. 3. Postcholecystectomy. Electronically Signed   By: Jackquline Boxer M.D.   On: 11/01/2023 13:26   DG Chest Portable 1 View Result Date: 11/01/2023 CLINICAL DATA:  Weakness and leg swelling EXAM: PORTABLE CHEST 1 VIEW COMPARISON:  Chest x-ray December 11, 2022 FINDINGS: Tunneled central venous catheter in proper position. Enlarged cardiac shadow partly due to low lung volume. No new consolidation. No pleural effusion or pneumothorax. No acute osseous abnormality. Visualized upper abdomen is unremarkable. IMPRESSION: No new consolidation or pleural effusion. Electronically Signed   By: Megan  Zare M.D.   On: 11/01/2023 11:28     .Fecal disimpaction  Date/Time: 11/01/2023 2:28 PM  Performed by: Dean Clarity, MD Authorized by: Dean Clarity, MD  Consent: Verbal consent obtained Consent given by: patient Patient identity confirmed: verbally with patient Time out: Immediately prior to procedure a time out was called to verify the correct patient, procedure, equipment, support staff and site/side marked as required. Local anesthesia used: no  Anesthesia: Local anesthesia used: no  Sedation: Patient sedated: no  Patient tolerance: patient tolerated the procedure well with no immediate complications      Medications Ordered in the ED  ondansetron  (ZOFRAN ) injection 4 mg (4 mg Intravenous Given 11/01/23 1155)  sodium phosphate (FLEET) enema 1 enema (1 enema Rectal Given 11/01/23 1454)                                    Medical Decision Making Amount and/or Complexity of Data Reviewed Labs: ordered. Radiology: ordered.  Risk OTC drugs. Prescription drug management.   This patient presents to the ED for concern of weakness/nausea, this involves an extensive number of treatment options, and is a complaint that carries with it a high risk of complications and morbidity.  The  differential diagnosis includes hyperkalemia, infection, colitis, constipation   Co morbidities that complicate the patient evaluation  DM, HTN, HLD, ESRD on HD, Anemia, afib (on Eliquis ), and arthritis   Additional history obtained:  Additional history obtained from epic chart  review External records from outside source obtained and reviewed including EMS report   Lab Tests:  I Ordered, and personally interpreted labs.  The pertinent results include:  cbc with wbc elevated at 20.3; cmp with bun 73 and cr 7.56; no urine as pt rarely urinates   Imaging Studies ordered:  I ordered imaging studies including ct abd/pelvis; cxr  I independently visualized and interpreted imaging which showed  CT abd/pelvis: 1. Large volume stool expanding the rectal vault most consistent  with fecal impaction.  2. Trace amount gas in the uterine fundus is nonspecific.  3. Postcholecystectomy.  CXR: No new consolidation or pleural effusion.  I agree with the radiologist interpretation   Cardiac Monitoring:  The patient was maintained on a cardiac monitor.  I personally viewed and interpreted the cardiac monitored which showed an underlying rhythm of: st   Medicines ordered and prescription drug management:  I ordered medication including zofran   for sx  Reevaluation of the patient after these medicines showed that the patient improved I have reviewed the patients home medicines and have made adjustments as needed   Test Considered:  ct   Critical Interventions:  disimpaction   Problem List / ED Course:  Fecal impaction/constipation:  pt feels much better.  Pt is stable for d/c.  Return if worse. ESRD on HD:  no indication for emergent dialysis.   Reevaluation:  After the interventions noted above, I reevaluated the patient and found that they have :improved   Social Determinants of Health:  Lives at home   Dispostion:  After consideration of the diagnostic results  and the patients response to treatment, I feel that the patent would benefit from discharge with outpatient f/u.       Final diagnoses:  Fecal impaction ([name])  Nausea  ESRD on hemodialysis [facility])    ED Discharge Orders     None          Dean Clarity, MD 11/01/23 [id number]    Dean Clarity, MD 11/01/23 1517

## 2023-11-03 DIAGNOSIS — N186 End stage renal disease: Secondary | ICD-10-CM | POA: Diagnosis not present

## 2023-11-03 DIAGNOSIS — Z992 Dependence on renal dialysis: Secondary | ICD-10-CM | POA: Diagnosis not present

## 2023-11-03 DIAGNOSIS — E43 Unspecified severe protein-calorie malnutrition: Secondary | ICD-10-CM | POA: Diagnosis not present

## 2023-11-05 DIAGNOSIS — I5032 Chronic diastolic (congestive) heart failure: Secondary | ICD-10-CM | POA: Diagnosis not present

## 2023-11-05 DIAGNOSIS — Z992 Dependence on renal dialysis: Secondary | ICD-10-CM | POA: Diagnosis not present

## 2023-11-05 DIAGNOSIS — D631 Anemia in chronic kidney disease: Secondary | ICD-10-CM | POA: Diagnosis not present

## 2023-11-05 DIAGNOSIS — E1122 Type 2 diabetes mellitus with diabetic chronic kidney disease: Secondary | ICD-10-CM | POA: Diagnosis not present

## 2023-11-05 DIAGNOSIS — Z79899 Other long term (current) drug therapy: Secondary | ICD-10-CM | POA: Diagnosis not present

## 2023-11-05 DIAGNOSIS — N186 End stage renal disease: Secondary | ICD-10-CM | POA: Diagnosis not present

## 2023-11-05 DIAGNOSIS — Z794 Long term (current) use of insulin: Secondary | ICD-10-CM | POA: Diagnosis not present

## 2023-11-06 DIAGNOSIS — E113293 Type 2 diabetes mellitus with mild nonproliferative diabetic retinopathy without macular edema, bilateral: Secondary | ICD-10-CM | POA: Diagnosis not present

## 2023-11-06 DIAGNOSIS — I132 Hypertensive heart and chronic kidney disease with heart failure and with stage 5 chronic kidney disease, or end stage renal disease: Secondary | ICD-10-CM | POA: Diagnosis not present

## 2023-11-06 DIAGNOSIS — E43 Unspecified severe protein-calorie malnutrition: Secondary | ICD-10-CM | POA: Diagnosis not present

## 2023-11-06 DIAGNOSIS — N186 End stage renal disease: Secondary | ICD-10-CM | POA: Diagnosis not present

## 2023-11-06 DIAGNOSIS — Z992 Dependence on renal dialysis: Secondary | ICD-10-CM | POA: Diagnosis not present

## 2023-11-06 NOTE — H&P (Signed)
 Surgical History & Physical  Patient Name: Chelsea Meza  DOB: 1936/04/21  Surgery: Cataract extraction with intraocular lens implant phacoemulsification; Right Eye Surgeon: Lynwood Hermann MD Surgery Date: 11/12/2023 Pre-Op Date: 09/03/2023  HPI: A 38 Yr. old female patient 1. The patient is present for a Cataract Evaluation. The patient complains of difficulty when reading fine print, books, newspaper, instructions etc., which began many years ago. Both eyes are affected. The episode is constant. The patient describes foggy symptoms affecting their eyes/vision. HPI was performed by Lynwood Hermann .  Medical History: Cataracts  Arthritis Diabetes Heart Problem High Blood Pressure  Review of Systems Cardiovascular High Blood Pressure, Pacemaker/defibrillator, diabetes All recorded systems are negative except as noted above.  Social Never smoked   Medication Prednisolone acetate, Ilevro, Moxifloxacin,  Ipratropium-albuterol , Fosfomycin tromethamine , Amlodipine , Atorvastatin , Hydralazine , Amlodipine , Nystatin, Furosemide , Insulin  lispro, Hydralazine , Gabapentin, Carvedilol , Humalog  KwikPen Insulin , Lokelma , Eliquis , Clotrimazole-betamethasone  Sx/Procedures Phaco c IOL OS - [id number] +22.0  Drug Allergies  NKDA  History & Physical: Heent: cataract NECK: supple without bruits LUNGS: lungs clear to auscultation CV: regular rate and rhythm Abdomen: soft and non-tender  Impression & Plan: Assessment: 1.  COMBINED FORMS AGE RELATED CATARACT; Right Eye (H25.811) 2.  DM Type 2; Both Severe Without ME (Z88.6506) 3.  INTRAOCULAR LENS IOL ; Left Eye (Z96.1)  Plan: 1.  Cataract accounts for the patient's decreased vision. This visual impairment is not correctable with a tolerable change in glasses or contact lenses. Cataract surgery with an implantation of a new lens should significantly improve the visual and functional status of the patient. Discussed all risks, benefits,  alternatives, and potential complications. Discussed the procedures and recovery. Patient desires to have surgery. A-scan ordered and performed today for intra-ocular lens calculations. The surgery will be performed in order to improve vision for driving, reading, and for eye examinations. Recommend phacoemulsification with intra-ocular lens. Recommend Dextenza  for post-operative pain and inflammation. Right Eye. Surgery required to correct imbalance of vision. Dilates well - shugarcaine by protocol. Will obtain records from [facility] for IOL calcs for the right eye.  2.  Recommend good blood sugar control.  3.  with Dr. Thaddeus.

## 2023-11-07 ENCOUNTER — Other Ambulatory Visit: Payer: Self-pay

## 2023-11-07 ENCOUNTER — Encounter (HOSPITAL_COMMUNITY)
Admission: RE | Admit: 2023-11-07 | Discharge: 2023-11-07 | Disposition: A | Discharge status: Home / Self Care | Source: Ambulatory Visit | Attending: Ophthalmology | Admitting: Ophthalmology

## 2023-11-07 ENCOUNTER — Encounter (HOSPITAL_COMMUNITY): Payer: Self-pay

## 2023-11-07 NOTE — Pre-Procedure Instructions (Signed)
 PAT visit completed with Nurse Burnard from [facility].

## 2023-11-08 ENCOUNTER — Other Ambulatory Visit: Payer: Self-pay

## 2023-11-08 ENCOUNTER — Encounter (HOSPITAL_COMMUNITY): Payer: Self-pay | Admitting: Emergency Medicine

## 2023-11-08 ENCOUNTER — Emergency Department (HOSPITAL_COMMUNITY)
Admission: EM | Admit: 2023-11-08 | Discharge: 2023-11-08 | Disposition: A | Discharge status: Home / Self Care | Source: Skilled Nursing Facility | Attending: Emergency Medicine | Admitting: Emergency Medicine

## 2023-11-08 DIAGNOSIS — Z992 Dependence on renal dialysis: Secondary | ICD-10-CM | POA: Diagnosis not present

## 2023-11-08 DIAGNOSIS — N751 Abscess of Bartholin's gland: Secondary | ICD-10-CM | POA: Diagnosis not present

## 2023-11-08 DIAGNOSIS — N764 Abscess of vulva: Secondary | ICD-10-CM

## 2023-11-08 DIAGNOSIS — N186 End stage renal disease: Secondary | ICD-10-CM | POA: Diagnosis not present

## 2023-11-08 DIAGNOSIS — Z7901 Long term (current) use of anticoagulants: Secondary | ICD-10-CM | POA: Diagnosis not present

## 2023-11-08 DIAGNOSIS — I10 Essential (primary) hypertension: Secondary | ICD-10-CM | POA: Diagnosis not present

## 2023-11-08 DIAGNOSIS — L089 Local infection of the skin and subcutaneous tissue, unspecified: Secondary | ICD-10-CM | POA: Diagnosis not present

## 2023-11-08 DIAGNOSIS — R404 Transient alteration of awareness: Secondary | ICD-10-CM | POA: Diagnosis not present

## 2023-11-08 DIAGNOSIS — H25811 Combined forms of age-related cataract, right eye: Secondary | ICD-10-CM | POA: Diagnosis not present

## 2023-11-08 DIAGNOSIS — Z7401 Bed confinement status: Secondary | ICD-10-CM | POA: Diagnosis not present

## 2023-11-08 DIAGNOSIS — E43 Unspecified severe protein-calorie malnutrition: Secondary | ICD-10-CM | POA: Diagnosis not present

## 2023-11-08 DIAGNOSIS — Z79899 Other long term (current) drug therapy: Secondary | ICD-10-CM | POA: Diagnosis not present

## 2023-11-08 MED ORDER — DOXYCYCLINE HYCLATE 100 MG PO CAPS: 100.0000 mg | ORAL_CAPSULE | Freq: Two times a day (BID) | ORAL | 0 refills | Status: DC

## 2023-11-08 MED ORDER — LIDOCAINE-EPINEPHRINE (PF) 2 %-1:200000 IJ SOLN
INTRAMUSCULAR | Status: DC
Start: 2023-11-08 — End: 2023-11-09
  Filled 2023-11-08: qty 20

## 2023-11-08 MED ORDER — LIDOCAINE-EPINEPHRINE 2 %-1:100000 IJ SOLN
20.0000 mL | Freq: Once | INTRAMUSCULAR | Status: AC
  Administered 2023-11-08: 20 mL via INTRADERMAL
  Filled 2023-11-08: qty 20

## 2023-11-08 NOTE — ED Notes (Signed)
 ED Provider at bedside.

## 2023-11-08 NOTE — ED Triage Notes (Signed)
 Pt sent in by [facility] for a abscess on her labia .

## 2023-11-08 NOTE — ED Provider Notes (Signed)
 [place] EMERGENCY DEPARTMENT AT [facility] Provider Note   CSN: [id number] Arrival date & time: 11/08/23  1407     Patient presents with: Abscess   Chelsea Meza is a 88 y.o. female.    Abscess  This patient is a elderly 88 year old female coming from [facility] nursing facility with a complaint of a vaginal abscess, she reports she has had pain for approximately 2 weeks, states that nobody looked at the area despite her asking repeatedly until today when they saw that there was a likely labial abscess.  She does report that it had burst at 1 point but it is now become recurrently tender.  She has no other symptoms including abdominal pain fevers nausea or vomiting.  The patient is on Eliquis , she is on hypertension medications    Prior to Admission medications   Medication Sig Start Date End Date Taking? Authorizing Provider  doxycycline  (VIBRAMYCIN ) 100 MG capsule Take 1 capsule (100 mg total) by mouth 2 (two) times daily. 11/08/23  Yes Cleotilde Rogue, MD  acetaminophen  (TYLENOL ) 325 MG tablet Take 2 tablets (650 mg total) by mouth every 6 (six) hours as needed for mild pain, fever or headache (or Fever >/= 101). 01/08/22   Pearlean Manus, MD  Amino Acids-Protein Hydrolys (FEEDING SUPPLEMENT, PRO-STAT SUGAR FREE 64,) LIQD Take 30 mLs by mouth in the morning, at noon, and at bedtime. (0600,0900 & 1600)    [provider]  amLODipine  (NORVASC ) 5 MG tablet Take 5 mg by mouth daily. 09/22/23   [provider]  apixaban  (ELIQUIS ) 2.5 MG TABS tablet Take 1 tablet (2.5 mg total) by mouth 2 (two) times daily. 04/13/22 11/08/26  Kommor, Madison, MD  atorvastatin  (LIPITOR) 40 MG tablet Take 40 mg by mouth at bedtime. (2100) 10/19/21   [provider]  Blood Glucose Monitoring Suppl (ACCU-CHEK GUIDE) w/Device KIT USE AS DIRECTED.I 09/03/20   [provider]  carvedilol  (COREG ) 3.125 MG tablet Take 1 tablet (3.125 mg total) by mouth 2 (two) times  daily with a meal. 01/08/22 11/08/26  Pearlean Manus, MD  Cholecalciferol (VITAMIN D3 SUPER STRENGTH) 50 MCG (2000 UT) TABS Take 2,000 Units by mouth in the morning. (0900)    [provider]  fosfomycin (MONUROL) 3 g PACK Take 3 g by mouth every 3 (three) days. (0900)    [provider]  furosemide  (LASIX ) 40 MG tablet Take 1 tablet (40 mg total) by mouth daily. 01/08/22   Pearlean Manus, MD  gabapentin (NEURONTIN) 100 MG capsule Take 100 mg by mouth every evening. (2000)    [provider]  hydrALAZINE  (APRESOLINE ) 50 MG tablet Take 50 mg by mouth 3 (three) times daily. 09/22/23   [provider]  Hydrocortisone (ANUSOL-HC EX) Apply topically.    [provider]  ILEVRO 0.3 % ophthalmic suspension Place 1 drop into both eyes daily. 11/05/23   [provider]  insulin  lispro (HUMALOG ) 100 UNIT/ML injection Inject 4 Units into the skin with breakfast, with lunch, and with evening meal. Hold for FSBS less than 150 or if not eating 01/09/22   [provider]  ipratropium-albuterol  (DUONEB) 0.5-2.5 (3) MG/3ML SOLN Inhale 3 mLs into the lungs every 4 (four) hours as needed (wheezing). 07/04/23   [provider]  MELATONIN PO Take 6 mg by mouth at bedtime. (2200)    [provider]  moxifloxacin (VIGAMOX) 0.5 % ophthalmic solution Place 1 drop into both eyes 3 (three) times daily. 11/05/23  [provider]  Multiple Vitamin (MULTIVITAMIN WITH MINERALS) TABS tablet Take 1 tablet by mouth in the morning. (0800)    [provider]  polyethylene glycol (MIRALAX  / GLYCOLAX ) 17 g packet Take 17 g by mouth 2 (two) times a week. Tuesdays & Saturdays (0900)    [provider]  prednisoLONE acetate (PRED FORTE) 1 % ophthalmic suspension Place 1 drop into both eyes 4 (four) times daily. 11/05/23   [provider]  senna-docusate (SENOKOT-S) 8.6-50 MG tablet Take 1 tablet by mouth daily. Patient taking  differently: Take 1 tablet by mouth every evening. (1800) 04/15/22   Garrick Charleston, MD  sevelamer carbonate (RENVELA) 800 MG tablet Take 800 mg by mouth 3 (three) times daily with meals.    [provider]  sodium zirconium cyclosilicate  (LOKELMA ) 10 g PACK packet Take 10 g by mouth every Monday, Wednesday, and Friday. 12/26/21   Evonnie Lenis, MD    Allergies: Patient has no known allergies.    Review of Systems  All other systems reviewed and are negative.   Updated Vital Signs BP (!) 177/92   Pulse 76   Temp (!) 97.4 F (36.3 C) (Oral)   Ht 1.753 m (5' 9)   Wt 82 kg   SpO2 97%   BMI 26.70 kg/m   Physical Exam Vitals and nursing note reviewed.  Constitutional:      General: She is not in acute distress.    Appearance: She is well-developed.  HENT:     Head: Normocephalic and atraumatic.     Mouth/Throat:     Pharynx: No oropharyngeal exudate.  Eyes:     General: No scleral icterus.       Right eye: No discharge.        Left eye: No discharge.     Conjunctiva/sclera: Conjunctivae normal.     Pupils: Pupils are equal, round, and reactive to light.  Neck:     Thyroid : No thyromegaly.     Vascular: No JVD.  Cardiovascular:     Rate and Rhythm: Normal rate and regular rhythm.     Heart sounds: Normal heart sounds. No murmur heard.    No friction rub. No gallop.  Pulmonary:     Effort: Pulmonary effort is normal. No respiratory distress.     Breath sounds: Normal breath sounds. No wheezing or rales.  Abdominal:     General: Bowel sounds are normal. There is no distension.     Palpations: Abdomen is soft. There is no mass.     Tenderness: There is no abdominal tenderness.  Genitourinary:    Comments: Chaperone present for exam, labia examined, right labia majora is tender indurated with an area of fluctuance, quite swollen, no spreading erythema, no tenderness in the perineum Musculoskeletal:        General: No tenderness. Normal range of motion.      Cervical back: Normal range of motion and neck supple.  Lymphadenopathy:     Cervical: No cervical adenopathy.  Skin:    General: Skin is warm and dry.     Findings: No erythema or rash.  Neurological:     Mental Status: She is alert.     Coordination: Coordination normal.  Psychiatric:        Behavior: Behavior normal.     (all labs ordered are listed, but only abnormal results are displayed) Labs Reviewed - No data to display  EKG: None  Radiology: No results found.   INCISION AND DRAINAGE  Date/Time: 11/08/2023 4:58 PM  Performed by: Cleotilde Rogue, MD Authorized by: Cleotilde Rogue, MD   Consent:    Consent obtained:  Verbal   Consent given by:  Patient   Risks discussed:  Bleeding, damage to other organs, incomplete drainage, infection and pain   Alternatives discussed:  Delayed treatment Universal protocol:    Procedure explained and questions answered to patient or proxy's satisfaction: yes     Immediately prior to procedure, a time out was called: yes     Patient identity confirmed:  Verbally with patient Location:    Type:  Abscess   Size:  Moderate   Location:  Anogenital   Anogenital location:  Bartholin's gland Pre-procedure details:    Skin preparation:  Betadine Sedation:    Sedation type:  None Anesthesia:    Anesthesia method:  Local infiltration   Local anesthetic:  Lidocaine  1% WITH epi Procedure type:    Complexity:  Complex Procedure details:    Ultrasound guidance: no     Needle aspiration: no     Incision types:  Single straight   Incision depth:  Submucosal   Wound management:  Probed and deloculated, irrigated with saline and extensive cleaning   Drainage:  Purulent   Drainage amount:  Moderate   Wound treatment:  Wound left open   Packing materials:  None Post-procedure details:    Procedure completion:  Tolerated well, no immediate complications Comments:          Medications Ordered in the ED  lidocaine -EPINEPHrine   (XYLOCAINE  W/EPI) 2 %-1:200000 (PF) injection (  Canceled Entry 11/08/23 1541)  lidocaine -EPINEPHrine  (XYLOCAINE  W/EPI) 2 %-1:100000 (with pres) injection 20 mL (20 mLs Intradermal Given 11/08/23 1541)                                    Medical Decision Making Risk Prescription drug management.    This patient presents to the ED for concern of possible abscess and labial swelling differential diagnosis includes abscess, cellulitis, cyst    Additional history obtained   Additional history obtained from Electronic Medical Record External records from outside source obtained and reviewed including medical record, this is including prior visits for fecal impactions, she has a known history of renal disease on dialysis, no recent admissions to the hospital over the last year    Medicines ordered and prescription drug management:  I ordered medication including doxycycline  outpt.    I have reviewed the patients home medicines and have made adjustments as needed   Problem List / ED Course:  Abscess drained - outpt doxy   Social Determinants of Health:  Elderly, in NH.        Final diagnoses:  Labial abscess    ED Discharge Orders          Ordered    doxycycline  (VIBRAMYCIN ) 100 MG capsule  2 times daily        11/08/23 1657               Cleotilde Rogue, MD 11/08/23 1659

## 2023-11-08 NOTE — ED Notes (Signed)
 Pt left with [facility].

## 2023-11-08 NOTE — Discharge Instructions (Addendum)
 Warm soaks Doxycycline  2 times daily for 10 days Tylenol  for pain ER for worsening swelling / fevers  See your doctor in 2-3 days for follow up of your wound check.

## 2023-11-10 DIAGNOSIS — N186 End stage renal disease: Secondary | ICD-10-CM | POA: Diagnosis not present

## 2023-11-10 DIAGNOSIS — E43 Unspecified severe protein-calorie malnutrition: Secondary | ICD-10-CM | POA: Diagnosis not present

## 2023-11-10 DIAGNOSIS — Z992 Dependence on renal dialysis: Secondary | ICD-10-CM | POA: Diagnosis not present

## 2023-11-12 ENCOUNTER — Ambulatory Visit (HOSPITAL_COMMUNITY): Admitting: Anesthesiology

## 2023-11-12 ENCOUNTER — Encounter (HOSPITAL_COMMUNITY): Payer: Self-pay | Admitting: Ophthalmology

## 2023-11-12 ENCOUNTER — Other Ambulatory Visit: Payer: Self-pay

## 2023-11-12 ENCOUNTER — Encounter (HOSPITAL_COMMUNITY)
Admission: RE | Disposition: A | Discharge status: Home / Self Care | Payer: Self-pay | Source: Home / Self Care | Attending: Ophthalmology

## 2023-11-12 ENCOUNTER — Ambulatory Visit (HOSPITAL_COMMUNITY)
Admission: RE | Admit: 2023-11-12 | Discharge: 2023-11-12 | Disposition: A | Discharge status: Home / Self Care | Attending: Ophthalmology | Admitting: Ophthalmology

## 2023-11-12 DIAGNOSIS — E1136 Type 2 diabetes mellitus with diabetic cataract: Secondary | ICD-10-CM | POA: Diagnosis not present

## 2023-11-12 DIAGNOSIS — Z794 Long term (current) use of insulin: Secondary | ICD-10-CM | POA: Insufficient documentation

## 2023-11-12 DIAGNOSIS — H25811 Combined forms of age-related cataract, right eye: Secondary | ICD-10-CM

## 2023-11-12 DIAGNOSIS — M1990 Unspecified osteoarthritis, unspecified site: Secondary | ICD-10-CM | POA: Diagnosis not present

## 2023-11-12 DIAGNOSIS — Z8673 Personal history of transient ischemic attack (TIA), and cerebral infarction without residual deficits: Secondary | ICD-10-CM | POA: Insufficient documentation

## 2023-11-12 DIAGNOSIS — I358 Other nonrheumatic aortic valve disorders: Secondary | ICD-10-CM | POA: Insufficient documentation

## 2023-11-12 DIAGNOSIS — I482 Chronic atrial fibrillation, unspecified: Secondary | ICD-10-CM | POA: Diagnosis not present

## 2023-11-12 DIAGNOSIS — Z7901 Long term (current) use of anticoagulants: Secondary | ICD-10-CM | POA: Diagnosis not present

## 2023-11-12 DIAGNOSIS — E1122 Type 2 diabetes mellitus with diabetic chronic kidney disease: Secondary | ICD-10-CM | POA: Insufficient documentation

## 2023-11-12 DIAGNOSIS — I509 Heart failure, unspecified: Secondary | ICD-10-CM | POA: Diagnosis not present

## 2023-11-12 DIAGNOSIS — N186 End stage renal disease: Secondary | ICD-10-CM | POA: Insufficient documentation

## 2023-11-12 DIAGNOSIS — I130 Hypertensive heart and chronic kidney disease with heart failure and stage 1 through stage 4 chronic kidney disease, or unspecified chronic kidney disease: Secondary | ICD-10-CM | POA: Diagnosis not present

## 2023-11-12 DIAGNOSIS — N184 Chronic kidney disease, stage 4 (severe): Secondary | ICD-10-CM | POA: Diagnosis not present

## 2023-11-12 DIAGNOSIS — Z961 Presence of intraocular lens: Secondary | ICD-10-CM | POA: Insufficient documentation

## 2023-11-12 DIAGNOSIS — Z7984 Long term (current) use of oral hypoglycemic drugs: Secondary | ICD-10-CM | POA: Diagnosis not present

## 2023-11-12 DIAGNOSIS — I132 Hypertensive heart and chronic kidney disease with heart failure and with stage 5 chronic kidney disease, or end stage renal disease: Secondary | ICD-10-CM | POA: Diagnosis not present

## 2023-11-12 DIAGNOSIS — I5032 Chronic diastolic (congestive) heart failure: Secondary | ICD-10-CM | POA: Diagnosis not present

## 2023-11-12 HISTORY — PX: CATARACT EXTRACTION W/PHACO: SHX586

## 2023-11-12 SURGERY — PHACOEMULSIFICATION, CATARACT, WITH IOL INSERTION
Anesthesia: Monitor Anesthesia Care | Site: Eye | Laterality: Right

## 2023-11-12 MED ORDER — BSS IO SOLN
INTRAOCULAR | Status: DC | PRN
  Administered 2023-11-12: 15 mL via INTRAOCULAR

## 2023-11-12 MED ORDER — MIDAZOLAM HCL 2 MG/2ML IJ SOLN
INTRAMUSCULAR | Status: DC | PRN
  Administered 2023-11-12: .5 mg via INTRAVENOUS

## 2023-11-12 MED ORDER — LIDOCAINE HCL 3.5 % OP GEL
1.0000 | Freq: Once | OPHTHALMIC | Status: AC
  Administered 2023-11-12: 1 via OPHTHALMIC

## 2023-11-12 MED ORDER — STERILE WATER FOR IRRIGATION IR SOLN
Status: DC | PRN
  Administered 2023-11-12: 1

## 2023-11-12 MED ORDER — SODIUM CHLORIDE 0.9% FLUSH: 3.0000 mL | INTRAVENOUS | Status: DC | PRN

## 2023-11-12 MED ORDER — SODIUM CHLORIDE 0.9% FLUSH
3.0000 mL | Freq: Two times a day (BID) | INTRAVENOUS | Status: DC
  Administered 2023-11-12: 3 mL via INTRAVENOUS
  Administered 2023-11-12: 10 mL via INTRAVENOUS

## 2023-11-12 MED ORDER — MOXIFLOXACIN HCL 5 MG/ML IO SOLN
INTRAOCULAR | Status: DC | PRN
  Administered 2023-11-12: .3 mL via INTRACAMERAL

## 2023-11-12 MED ORDER — PHENYLEPHRINE-KETOROLAC 1-0.3 % IO SOLN
INTRAOCULAR | Status: DC | PRN
  Administered 2023-11-12: 500 mL via OPHTHALMIC

## 2023-11-12 MED ORDER — SODIUM HYALURONATE 23MG/ML IO SOSY
PREFILLED_SYRINGE | INTRAOCULAR | Status: DC | PRN
  Administered 2023-11-12: .6 mL via INTRAOCULAR

## 2023-11-12 MED ORDER — PHENYLEPHRINE HCL 2.5 % OP SOLN
1.0000 [drp] | OPHTHALMIC | Status: AC | PRN
  Administered 2023-11-12 (×3): 1 [drp] via OPHTHALMIC

## 2023-11-12 MED ORDER — TROPICAMIDE 1 % OP SOLN
1.0000 [drp] | OPHTHALMIC | Status: AC | PRN
  Administered 2023-11-12 (×3): 1 [drp] via OPHTHALMIC

## 2023-11-12 MED ORDER — TETRACAINE HCL 0.5 % OP SOLN
1.0000 [drp] | OPHTHALMIC | Status: AC | PRN
  Administered 2023-11-12 (×3): 1 [drp] via OPHTHALMIC

## 2023-11-12 MED ORDER — POVIDONE-IODINE 5 % OP SOLN
OPHTHALMIC | Status: DC | PRN
  Administered 2023-11-12: 1 via OPHTHALMIC

## 2023-11-12 MED ORDER — LACTATED RINGERS IV SOLN: INTRAVENOUS | Status: DC

## 2023-11-12 MED ORDER — SODIUM HYALURONATE 10 MG/ML IO SOLUTION
PREFILLED_SYRINGE | INTRAOCULAR | Status: DC | PRN
  Administered 2023-11-12: .85 mL via INTRAOCULAR

## 2023-11-12 MED ORDER — MIDAZOLAM HCL 2 MG/2ML IJ SOLN
INTRAMUSCULAR | Status: AC
  Filled 2023-11-12: qty 2

## 2023-11-12 MED ORDER — LIDOCAINE HCL (PF) 1 % IJ SOLN
INTRAMUSCULAR | Status: DC | PRN
  Administered 2023-11-12: 1 mL

## 2023-11-12 SURGICAL SUPPLY — 12 items
CATARACT SUITE SIGHTPATH (MISCELLANEOUS) ×1 IMPLANT
CLOTH BEACON ORANGE TIMEOUT ST (SAFETY) ×2 IMPLANT
EYE SHIELD UNIVERSAL CLEAR (GAUZE/BANDAGES/DRESSINGS) IMPLANT
FEE CATARACT SUITE SIGHTPATH (MISCELLANEOUS) ×2 IMPLANT
GLOVE BIOGEL PI IND STRL 7.0 (GLOVE) ×4 IMPLANT
LENS IOL CLRN WGN WHL 22.5 (Intraocular Lens) IMPLANT
NDL HYPO 18GX1.5 BLUNT FILL (NEEDLE) ×2 IMPLANT
NEEDLE HYPO 18GX1.5 BLUNT FILL (NEEDLE) ×1 IMPLANT
PAD ARMBOARD POSITIONER FOAM (MISCELLANEOUS) ×2 IMPLANT
SYR TB 1ML LL NO SAFETY (SYRINGE) ×2 IMPLANT
TAPE SURG TRANSPORE 1 IN (GAUZE/BANDAGES/DRESSINGS) IMPLANT
WATER STERILE IRR 250ML POUR (IV SOLUTION) ×2 IMPLANT

## 2023-11-12 NOTE — Discharge Instructions (Addendum)
 Please discharge patient when stable, will follow up today with Dr. June Leap at the [facility] office immediately following discharge.  Leave shield in place until visit.  All paperwork with discharge instructions will be given at the office.  [facility] Address:  [address]  [place], [place] [address]

## 2023-11-12 NOTE — Op Note (Signed)
 Date of procedure: 11/12/23  Pre-operative diagnosis:  Visually significant combined form age-related cataract, Right Eye (H25.811)  Post-operative diagnosis:  Visually significant combined form age-related cataract, Right Eye (H25.811)  Procedure: Removal of cataract via phacoemulsification and insertion of intra-ocular lens Alcon [id number] +22.5D into the capsular bag of the Right Eye  Attending surgeon: Lynwood LABOR. Geffrey Michaelsen, MD, MA  Anesthesia: MAC, Topical Akten   Complications: None  Estimated Blood Loss: <39mL (minimal)  Specimens: None  Implants: As above  Indications:  Visually significant age-related cataract, Right Eye  Procedure:  The patient was seen and identified in the pre-operative area. The operative eye was identified and dilated.  The operative eye was marked.  Topical anesthesia was administered to the operative eye.     The patient was then to the operative suite and placed in the supine position.  A timeout was performed confirming the patient, procedure to be performed, and all other relevant information.   The patient's face was prepped and draped in the usual fashion for intra-ocular surgery.  A lid speculum was placed into the operative eye and the surgical microscope moved into place and focused.  A superotemporal paracentesis was created using a 20 gauge paracentesis blade. Omidria  was injected into the anterior chamber. Shugarcaine was injected into the anterior chamber.  Viscoelastic was injected into the anterior chamber.  A temporal clear-corneal main wound incision was created using a 2.53mm microkeratome.  A continuous curvilinear capsulorrhexis was initiated using an irrigating cystitome and completed using capsulorrhexis forceps.  Hydrodissection and hydrodeliniation were performed.  Viscoelastic was injected into the anterior chamber.  A phacoemulsification handpiece and a chopper as a second instrument were used to remove the nucleus and epinucleus. The  irrigation/aspiration handpiece was used to remove any remaining cortical material.   The capsular bag was reinflated with viscoelastic, checked, and found to be intact.  The intraocular lens was inserted into the capsular bag.  The irrigation/aspiration handpiece was used to remove any remaining viscoelastic.  The clear corneal wound and paracentesis wounds were then hydrated and checked with Weck-Cels to be watertight. 0.1mL of Moxfloxacin was injected into the anterior chamber. The lid-speculum was removed.  The drape was removed.  The patient's face was cleaned with a wet and dry 4x4. A clear shield was taped over the eye. The patient was taken to the post-operative care unit in good condition, having tolerated the procedure well.  Post-Op Instructions: The patient will follow up at [facility] for a same day post-operative evaluation and will receive all other orders and instructions.

## 2023-11-12 NOTE — Anesthesia Preprocedure Evaluation (Addendum)
 Anesthesia Evaluation  Patient identified by MRN, date of birth, ID band Patient awake    Reviewed: Allergy & Precautions, NPO status , Patient's Chart, lab work & pertinent test results, Unable to perform ROS - Chart review only  Airway Mallampati: III  TM Distance: >3 FB Neck ROM: Full    Dental  (+) Lower Dentures, Upper Dentures   Pulmonary neg pulmonary ROS   Pulmonary exam normal breath sounds clear to auscultation       Cardiovascular Exercise Tolerance: Poor hypertension, Pt. on medications and Pt. on home beta blockers +CHF  Normal cardiovascular exam+ dysrhythmias Atrial Fibrillation  Rhythm:Regular Rate:Normal + Systolic murmurs 1. Echocardiographic features concerning for infiltrative disease such as  amyloidosis. Left ventricular ejection fraction, by estimation, is 60 to  65%. The left ventricle has normal function. The left ventricle has no  regional wall motion abnormalities.  There is severe left ventricular hypertrophy. Left ventricular diastolic  function could not be evaluated.  2. Right ventricular systolic function is normal. The right ventricular  size is normal. Moderately increased right ventricular wall thickness.  There is mildly elevated pulmonary artery systolic pressure. The estimated  right ventricular systolic pressure  is 37.2 mmHg.  3. Left atrial size was moderately dilated.  4. A small pericardial effusion is present. The pericardial effusion is  localized near the right atrium. There is no evidence of cardiac  tamponade.  5. The mitral valve is abnormal. Trivial mitral valve regurgitation.  6. The aortic valve is tricuspid. Aortic valve regurgitation is trivial.  Aortic valve sclerosis is present, with no evidence of aortic valve  stenosis.  7. Aortic dilatation noted. There is mild dilatation of the aortic root,  measuring 40 mm.  8. The inferior vena cava is normal in size with  <50% respiratory  variability, suggesting right atrial pressure of 8 mmHg.    Neuro/Psych Seizures -, Well Controlled,  TIA negative psych ROS   GI/Hepatic negative GI ROS, Neg liver ROS,,,  Endo/Other  diabetes, Well Controlled, Type 2, Insulin  Dependent, Oral Hypoglycemic Agents    Renal/GU ESRFRenal disease     Musculoskeletal  (+) Arthritis , Osteoarthritis,    Abdominal   Peds  Hematology  (+) Blood dyscrasia, anemia   Anesthesia Other Findings   Reproductive/Obstetrics negative OB ROS                              Anesthesia Physical Anesthesia Plan  ASA: 4  Anesthesia Plan: MAC   Post-op Pain Management:    Induction: Intravenous  PONV Risk Score and Plan:   Airway Management Planned: Natural Airway and Nasal Cannula  Additional Equipment: None  Intra-op Plan:   Post-operative Plan:   Informed Consent: I have reviewed the patients History and Physical, chart, labs and discussed the procedure including the risks, benefits and alternatives for the proposed anesthesia with the patient or authorized representative who has indicated his/her understanding and acceptance.     Dental advisory given  Plan Discussed with: CRNA  Anesthesia Plan Comments: ( 1. Echocardiographic features concerning for infiltrative disease such as  amyloidosis. Left ventricular ejection fraction, by estimation, is 60 to  65%. The left ventricle has normal function. The left ventricle has no  regional wall motion abnormalities.  There is severe left ventricular hypertrophy. Left ventricular diastolic  function could not be evaluated.  2. Right ventricular systolic function is normal. The right ventricular  size is normal. Moderately increased  right ventricular wall thickness.  There is mildly elevated pulmonary artery systolic pressure. The estimated  right ventricular systolic pressure  is 37.2 mmHg.  3. Left atrial size was moderately  dilated.  4. A small pericardial effusion is present. The pericardial effusion is  localized near the right atrium. There is no evidence of cardiac  tamponade.  5. The mitral valve is abnormal. Trivial mitral valve regurgitation.  6. The aortic valve is tricuspid. Aortic valve regurgitation is trivial.  Aortic valve sclerosis is present, with no evidence of aortic valve  stenosis.  7. Aortic dilatation noted. There is mild dilatation of the aortic root,  measuring 40 mm.  8. The inferior vena cava is normal in size with <50% respiratory  variability, suggesting right atrial pressure of 8 mmHg.   Comparison(s): Changes from prior study are noted. 04/05/2020: LVEF 65-70%,  severe LVH, pericardial effusion.     )         Anesthesia Quick Evaluation

## 2023-11-12 NOTE — Interval H&P Note (Signed)
 History and Physical Interval Note:  11/12/2023 11:03 AM  Chelsea Meza  has presented today for surgery, with the diagnosis of combined forms age related cataract, right eye.  The various methods of treatment have been discussed with the patient and family. After consideration of risks, benefits and other options for treatment, the patient has consented to  Procedure(s): PHACOEMULSIFICATION, CATARACT, WITH IOL INSERTION (Right) as a surgical intervention.  The patient's history has been reviewed, patient examined, no change in status, stable for surgery.  I have reviewed the patient's chart and labs.  Questions were answered to the patient's satisfaction.     HARRIE AGENT

## 2023-11-12 NOTE — Anesthesia Postprocedure Evaluation (Signed)
 Anesthesia Post Note  Patient: Chelsea Meza  Procedure(s) Performed: PHACOEMULSIFICATION, CATARACT, WITH IOL INSERTION (Right: Eye)  Patient location during evaluation: Phase II Anesthesia Type: MAC Level of consciousness: awake and alert Pain management: pain level controlled Vital Signs Assessment: post-procedure vital signs reviewed and stable Respiratory status: spontaneous breathing, nonlabored ventilation and respiratory function stable Cardiovascular status: stable and blood pressure returned to baseline Postop Assessment: no apparent nausea or vomiting Anesthetic complications: no   There were no known notable events for this encounter.   Last Vitals:  Vitals:   11/12/23 1037 11/12/23 1131  BP: (!) 187/96 (!) 176/85  Pulse: 68 73  Resp:  17  Temp: 36.4 C 36.5 C  SpO2: 100% 100%    Last Pain:  Vitals:   11/12/23 1131  TempSrc: Oral  PainSc: 0-No pain                 Avarose Mervine L Marianita Botkin

## 2023-11-12 NOTE — Anesthesia Procedure Notes (Signed)
 Date/Time: 11/12/2023 11:10 AM  Performed by: Barbarann Verneita RAMAN, CRNAPre-anesthesia Checklist: Patient identified, Emergency Drugs available, Suction available, Timeout performed and Patient being monitored Patient Re-evaluated:Patient Re-evaluated prior to induction Oxygen Delivery Method: Nasal Cannula

## 2023-11-12 NOTE — Transfer of Care (Signed)
 Immediate Anesthesia Transfer of Care Note  Patient: Chelsea Meza  Procedure(s) Performed: PHACOEMULSIFICATION, CATARACT, WITH IOL INSERTION (Right: Eye)  Patient Location: Short Stay  Anesthesia Type:MAC  Level of Consciousness: awake and patient cooperative  Airway & Oxygen Therapy: Patient Spontanous Breathing  Post-op Assessment: Report given to RN and Post -op Vital signs reviewed and stable  Post vital signs: Reviewed and stable  Last Vitals:  Vitals Value Taken Time  BP 176/85 11/12/23 11:31  Temp 36.5 C 11/12/23 11:31  Pulse 73 11/12/23 11:31  Resp 17 11/12/23 11:31  SpO2 100 % 11/12/23 11:31    Last Pain:  Vitals:   11/12/23 1131  TempSrc: Oral  PainSc: 0-No pain         Complications: No notable events documented.

## 2023-11-13 ENCOUNTER — Encounter (HOSPITAL_COMMUNITY): Payer: Self-pay | Admitting: Ophthalmology

## 2023-11-13 DIAGNOSIS — E43 Unspecified severe protein-calorie malnutrition: Secondary | ICD-10-CM | POA: Diagnosis not present

## 2023-11-13 DIAGNOSIS — N186 End stage renal disease: Secondary | ICD-10-CM | POA: Diagnosis not present

## 2023-11-13 LAB — POCT I-STAT, CHEM 8
BUN: 55 mg/dL — ABNORMAL HIGH (ref 8–23)
Calcium, Ion: 1.08 mmol/L — ABNORMAL LOW (ref 1.15–1.40)
Chloride: 98 mmol/L (ref 98–111)
Creatinine, Ser: 5.5 mg/dL — ABNORMAL HIGH (ref 0.44–1.00)
Glucose, Bld: 157 mg/dL — ABNORMAL HIGH (ref 70–99)
HCT: 39 % (ref 36.0–46.0)
Hemoglobin: 13.3 g/dL (ref 12.0–15.0)
Potassium: 4.3 mmol/L (ref 3.5–5.1)
Sodium: 132 mmol/L — ABNORMAL LOW (ref 135–145)
TCO2: 24 mmol/L (ref 22–32)

## 2023-11-15 DIAGNOSIS — N186 End stage renal disease: Secondary | ICD-10-CM | POA: Diagnosis not present

## 2023-11-15 DIAGNOSIS — E119 Type 2 diabetes mellitus without complications: Secondary | ICD-10-CM | POA: Diagnosis not present

## 2023-11-15 DIAGNOSIS — R52 Pain, unspecified: Secondary | ICD-10-CM | POA: Diagnosis not present

## 2023-11-15 DIAGNOSIS — Z992 Dependence on renal dialysis: Secondary | ICD-10-CM | POA: Diagnosis not present

## 2023-11-15 DIAGNOSIS — N751 Abscess of Bartholin's gland: Secondary | ICD-10-CM | POA: Diagnosis not present

## 2023-11-15 DIAGNOSIS — Z79899 Other long term (current) drug therapy: Secondary | ICD-10-CM | POA: Diagnosis not present

## 2023-11-15 DIAGNOSIS — E43 Unspecified severe protein-calorie malnutrition: Secondary | ICD-10-CM | POA: Diagnosis not present

## 2023-11-17 DIAGNOSIS — N186 End stage renal disease: Secondary | ICD-10-CM | POA: Diagnosis not present

## 2023-11-17 DIAGNOSIS — E43 Unspecified severe protein-calorie malnutrition: Secondary | ICD-10-CM | POA: Diagnosis not present

## 2023-11-17 DIAGNOSIS — Z992 Dependence on renal dialysis: Secondary | ICD-10-CM | POA: Diagnosis not present

## 2023-11-20 DIAGNOSIS — R52 Pain, unspecified: Secondary | ICD-10-CM | POA: Diagnosis not present

## 2023-11-20 DIAGNOSIS — R296 Repeated falls: Secondary | ICD-10-CM | POA: Diagnosis not present

## 2023-11-20 DIAGNOSIS — Z9841 Cataract extraction status, right eye: Secondary | ICD-10-CM | POA: Diagnosis not present

## 2023-11-20 DIAGNOSIS — I132 Hypertensive heart and chronic kidney disease with heart failure and with stage 5 chronic kidney disease, or end stage renal disease: Secondary | ICD-10-CM | POA: Diagnosis not present

## 2023-11-20 DIAGNOSIS — E43 Unspecified severe protein-calorie malnutrition: Secondary | ICD-10-CM | POA: Diagnosis not present

## 2023-11-20 DIAGNOSIS — Z79899 Other long term (current) drug therapy: Secondary | ICD-10-CM | POA: Diagnosis not present

## 2023-11-20 DIAGNOSIS — N186 End stage renal disease: Secondary | ICD-10-CM | POA: Diagnosis not present

## 2023-11-20 DIAGNOSIS — R2689 Other abnormalities of gait and mobility: Secondary | ICD-10-CM | POA: Diagnosis not present

## 2023-11-20 DIAGNOSIS — M6281 Muscle weakness (generalized): Secondary | ICD-10-CM | POA: Diagnosis not present

## 2023-11-20 DIAGNOSIS — R269 Unspecified abnormalities of gait and mobility: Secondary | ICD-10-CM | POA: Diagnosis not present

## 2023-11-21 DIAGNOSIS — N751 Abscess of Bartholin's gland: Secondary | ICD-10-CM | POA: Diagnosis not present

## 2023-11-21 DIAGNOSIS — Z992 Dependence on renal dialysis: Secondary | ICD-10-CM | POA: Diagnosis not present

## 2023-11-21 DIAGNOSIS — I4891 Unspecified atrial fibrillation: Secondary | ICD-10-CM | POA: Diagnosis not present

## 2023-11-21 DIAGNOSIS — R2689 Other abnormalities of gait and mobility: Secondary | ICD-10-CM | POA: Diagnosis not present

## 2023-11-21 DIAGNOSIS — R296 Repeated falls: Secondary | ICD-10-CM | POA: Diagnosis not present

## 2023-11-21 DIAGNOSIS — I130 Hypertensive heart and chronic kidney disease with heart failure and stage 1 through stage 4 chronic kidney disease, or unspecified chronic kidney disease: Secondary | ICD-10-CM | POA: Diagnosis not present

## 2023-11-21 DIAGNOSIS — E1142 Type 2 diabetes mellitus with diabetic polyneuropathy: Secondary | ICD-10-CM | POA: Diagnosis not present

## 2023-11-21 DIAGNOSIS — I132 Hypertensive heart and chronic kidney disease with heart failure and with stage 5 chronic kidney disease, or end stage renal disease: Secondary | ICD-10-CM | POA: Diagnosis not present

## 2023-11-21 DIAGNOSIS — R269 Unspecified abnormalities of gait and mobility: Secondary | ICD-10-CM | POA: Diagnosis not present

## 2023-11-21 DIAGNOSIS — E559 Vitamin D deficiency, unspecified: Secondary | ICD-10-CM | POA: Diagnosis not present

## 2023-11-21 DIAGNOSIS — E1122 Type 2 diabetes mellitus with diabetic chronic kidney disease: Secondary | ICD-10-CM | POA: Diagnosis not present

## 2023-11-21 DIAGNOSIS — M6281 Muscle weakness (generalized): Secondary | ICD-10-CM | POA: Diagnosis not present

## 2023-11-21 DIAGNOSIS — D631 Anemia in chronic kidney disease: Secondary | ICD-10-CM | POA: Diagnosis not present

## 2023-11-21 DIAGNOSIS — N186 End stage renal disease: Secondary | ICD-10-CM | POA: Diagnosis not present

## 2023-11-21 DIAGNOSIS — I5032 Chronic diastolic (congestive) heart failure: Secondary | ICD-10-CM | POA: Diagnosis not present

## 2023-11-21 DIAGNOSIS — E785 Hyperlipidemia, unspecified: Secondary | ICD-10-CM | POA: Diagnosis not present

## 2023-11-22 DIAGNOSIS — R269 Unspecified abnormalities of gait and mobility: Secondary | ICD-10-CM | POA: Diagnosis not present

## 2023-11-22 DIAGNOSIS — M6281 Muscle weakness (generalized): Secondary | ICD-10-CM | POA: Diagnosis not present

## 2023-11-22 DIAGNOSIS — N186 End stage renal disease: Secondary | ICD-10-CM | POA: Diagnosis not present

## 2023-11-22 DIAGNOSIS — I132 Hypertensive heart and chronic kidney disease with heart failure and with stage 5 chronic kidney disease, or end stage renal disease: Secondary | ICD-10-CM | POA: Diagnosis not present

## 2023-11-22 DIAGNOSIS — E43 Unspecified severe protein-calorie malnutrition: Secondary | ICD-10-CM | POA: Diagnosis not present

## 2023-11-22 DIAGNOSIS — Z992 Dependence on renal dialysis: Secondary | ICD-10-CM | POA: Diagnosis not present

## 2023-11-22 DIAGNOSIS — R2689 Other abnormalities of gait and mobility: Secondary | ICD-10-CM | POA: Diagnosis not present

## 2023-11-22 DIAGNOSIS — R296 Repeated falls: Secondary | ICD-10-CM | POA: Diagnosis not present

## 2023-11-23 DIAGNOSIS — R269 Unspecified abnormalities of gait and mobility: Secondary | ICD-10-CM | POA: Diagnosis not present

## 2023-11-23 DIAGNOSIS — R296 Repeated falls: Secondary | ICD-10-CM | POA: Diagnosis not present

## 2023-11-23 DIAGNOSIS — R2689 Other abnormalities of gait and mobility: Secondary | ICD-10-CM | POA: Diagnosis not present

## 2023-11-23 DIAGNOSIS — I132 Hypertensive heart and chronic kidney disease with heart failure and with stage 5 chronic kidney disease, or end stage renal disease: Secondary | ICD-10-CM | POA: Diagnosis not present

## 2023-11-23 DIAGNOSIS — M6281 Muscle weakness (generalized): Secondary | ICD-10-CM | POA: Diagnosis not present

## 2023-11-24 DIAGNOSIS — E43 Unspecified severe protein-calorie malnutrition: Secondary | ICD-10-CM | POA: Diagnosis not present

## 2023-11-24 DIAGNOSIS — N186 End stage renal disease: Secondary | ICD-10-CM | POA: Diagnosis not present

## 2023-11-24 DIAGNOSIS — Z992 Dependence on renal dialysis: Secondary | ICD-10-CM | POA: Diagnosis not present

## 2023-11-25 DIAGNOSIS — I132 Hypertensive heart and chronic kidney disease with heart failure and with stage 5 chronic kidney disease, or end stage renal disease: Secondary | ICD-10-CM | POA: Diagnosis not present

## 2023-11-25 DIAGNOSIS — R296 Repeated falls: Secondary | ICD-10-CM | POA: Diagnosis not present

## 2023-11-25 DIAGNOSIS — R269 Unspecified abnormalities of gait and mobility: Secondary | ICD-10-CM | POA: Diagnosis not present

## 2023-11-25 DIAGNOSIS — R2689 Other abnormalities of gait and mobility: Secondary | ICD-10-CM | POA: Diagnosis not present

## 2023-11-25 DIAGNOSIS — M6281 Muscle weakness (generalized): Secondary | ICD-10-CM | POA: Diagnosis not present

## 2023-11-26 DIAGNOSIS — M6281 Muscle weakness (generalized): Secondary | ICD-10-CM | POA: Diagnosis not present

## 2023-11-26 DIAGNOSIS — R2689 Other abnormalities of gait and mobility: Secondary | ICD-10-CM | POA: Diagnosis not present

## 2023-11-26 DIAGNOSIS — R296 Repeated falls: Secondary | ICD-10-CM | POA: Diagnosis not present

## 2023-11-26 DIAGNOSIS — R269 Unspecified abnormalities of gait and mobility: Secondary | ICD-10-CM | POA: Diagnosis not present

## 2023-11-26 DIAGNOSIS — I132 Hypertensive heart and chronic kidney disease with heart failure and with stage 5 chronic kidney disease, or end stage renal disease: Secondary | ICD-10-CM | POA: Diagnosis not present

## 2023-11-27 DIAGNOSIS — E43 Unspecified severe protein-calorie malnutrition: Secondary | ICD-10-CM | POA: Diagnosis not present

## 2023-11-27 DIAGNOSIS — Z992 Dependence on renal dialysis: Secondary | ICD-10-CM | POA: Diagnosis not present

## 2023-11-27 DIAGNOSIS — N186 End stage renal disease: Secondary | ICD-10-CM | POA: Diagnosis not present

## 2023-11-28 DIAGNOSIS — I132 Hypertensive heart and chronic kidney disease with heart failure and with stage 5 chronic kidney disease, or end stage renal disease: Secondary | ICD-10-CM | POA: Diagnosis not present

## 2023-11-28 DIAGNOSIS — R2689 Other abnormalities of gait and mobility: Secondary | ICD-10-CM | POA: Diagnosis not present

## 2023-11-28 DIAGNOSIS — R269 Unspecified abnormalities of gait and mobility: Secondary | ICD-10-CM | POA: Diagnosis not present

## 2023-11-28 DIAGNOSIS — R296 Repeated falls: Secondary | ICD-10-CM | POA: Diagnosis not present

## 2023-11-28 DIAGNOSIS — M6281 Muscle weakness (generalized): Secondary | ICD-10-CM | POA: Diagnosis not present

## 2023-11-29 DIAGNOSIS — N186 End stage renal disease: Secondary | ICD-10-CM | POA: Diagnosis not present

## 2023-11-29 DIAGNOSIS — Z992 Dependence on renal dialysis: Secondary | ICD-10-CM | POA: Diagnosis not present

## 2023-11-29 DIAGNOSIS — E43 Unspecified severe protein-calorie malnutrition: Secondary | ICD-10-CM | POA: Diagnosis not present

## 2023-12-01 DIAGNOSIS — Z992 Dependence on renal dialysis: Secondary | ICD-10-CM | POA: Diagnosis not present

## 2023-12-01 DIAGNOSIS — N186 End stage renal disease: Secondary | ICD-10-CM | POA: Diagnosis not present

## 2023-12-01 DIAGNOSIS — E43 Unspecified severe protein-calorie malnutrition: Secondary | ICD-10-CM | POA: Diagnosis not present

## 2023-12-02 DIAGNOSIS — N186 End stage renal disease: Secondary | ICD-10-CM | POA: Diagnosis not present

## 2023-12-02 DIAGNOSIS — R2689 Other abnormalities of gait and mobility: Secondary | ICD-10-CM | POA: Diagnosis not present

## 2023-12-02 DIAGNOSIS — R269 Unspecified abnormalities of gait and mobility: Secondary | ICD-10-CM | POA: Diagnosis not present

## 2023-12-02 DIAGNOSIS — M6281 Muscle weakness (generalized): Secondary | ICD-10-CM | POA: Diagnosis not present

## 2023-12-02 DIAGNOSIS — R296 Repeated falls: Secondary | ICD-10-CM | POA: Diagnosis not present

## 2023-12-02 DIAGNOSIS — Z992 Dependence on renal dialysis: Secondary | ICD-10-CM | POA: Diagnosis not present

## 2023-12-03 DIAGNOSIS — I132 Hypertensive heart and chronic kidney disease with heart failure and with stage 5 chronic kidney disease, or end stage renal disease: Secondary | ICD-10-CM | POA: Diagnosis not present

## 2023-12-03 DIAGNOSIS — R2689 Other abnormalities of gait and mobility: Secondary | ICD-10-CM | POA: Diagnosis not present

## 2023-12-03 DIAGNOSIS — M2041 Other hammer toe(s) (acquired), right foot: Secondary | ICD-10-CM | POA: Diagnosis not present

## 2023-12-03 DIAGNOSIS — M2042 Other hammer toe(s) (acquired), left foot: Secondary | ICD-10-CM | POA: Diagnosis not present

## 2023-12-03 DIAGNOSIS — R296 Repeated falls: Secondary | ICD-10-CM | POA: Diagnosis not present

## 2023-12-03 DIAGNOSIS — I739 Peripheral vascular disease, unspecified: Secondary | ICD-10-CM | POA: Diagnosis not present

## 2023-12-03 DIAGNOSIS — E1151 Type 2 diabetes mellitus with diabetic peripheral angiopathy without gangrene: Secondary | ICD-10-CM | POA: Diagnosis not present

## 2023-12-03 DIAGNOSIS — R269 Unspecified abnormalities of gait and mobility: Secondary | ICD-10-CM | POA: Diagnosis not present

## 2023-12-03 DIAGNOSIS — M6281 Muscle weakness (generalized): Secondary | ICD-10-CM | POA: Diagnosis not present

## 2023-12-04 DIAGNOSIS — M6281 Muscle weakness (generalized): Secondary | ICD-10-CM | POA: Diagnosis not present

## 2023-12-04 DIAGNOSIS — I132 Hypertensive heart and chronic kidney disease with heart failure and with stage 5 chronic kidney disease, or end stage renal disease: Secondary | ICD-10-CM | POA: Diagnosis not present

## 2023-12-04 DIAGNOSIS — N186 End stage renal disease: Secondary | ICD-10-CM | POA: Diagnosis not present

## 2023-12-04 DIAGNOSIS — E43 Unspecified severe protein-calorie malnutrition: Secondary | ICD-10-CM | POA: Diagnosis not present

## 2023-12-04 DIAGNOSIS — R269 Unspecified abnormalities of gait and mobility: Secondary | ICD-10-CM | POA: Diagnosis not present

## 2023-12-04 DIAGNOSIS — R296 Repeated falls: Secondary | ICD-10-CM | POA: Diagnosis not present

## 2023-12-04 DIAGNOSIS — Z992 Dependence on renal dialysis: Secondary | ICD-10-CM | POA: Diagnosis not present

## 2023-12-04 DIAGNOSIS — R2689 Other abnormalities of gait and mobility: Secondary | ICD-10-CM | POA: Diagnosis not present

## 2023-12-06 DIAGNOSIS — E43 Unspecified severe protein-calorie malnutrition: Secondary | ICD-10-CM | POA: Diagnosis not present

## 2023-12-06 DIAGNOSIS — Z992 Dependence on renal dialysis: Secondary | ICD-10-CM | POA: Diagnosis not present

## 2023-12-06 DIAGNOSIS — N186 End stage renal disease: Secondary | ICD-10-CM | POA: Diagnosis not present

## 2023-12-08 DIAGNOSIS — E43 Unspecified severe protein-calorie malnutrition: Secondary | ICD-10-CM | POA: Diagnosis not present

## 2023-12-08 DIAGNOSIS — N186 End stage renal disease: Secondary | ICD-10-CM | POA: Diagnosis not present

## 2023-12-10 DIAGNOSIS — N186 End stage renal disease: Secondary | ICD-10-CM | POA: Diagnosis not present

## 2023-12-10 DIAGNOSIS — Z992 Dependence on renal dialysis: Secondary | ICD-10-CM | POA: Diagnosis not present

## 2023-12-11 DIAGNOSIS — I5032 Chronic diastolic (congestive) heart failure: Secondary | ICD-10-CM | POA: Diagnosis not present

## 2023-12-11 DIAGNOSIS — E1122 Type 2 diabetes mellitus with diabetic chronic kidney disease: Secondary | ICD-10-CM | POA: Diagnosis not present

## 2023-12-11 DIAGNOSIS — E43 Unspecified severe protein-calorie malnutrition: Secondary | ICD-10-CM | POA: Diagnosis not present

## 2023-12-11 DIAGNOSIS — N186 End stage renal disease: Secondary | ICD-10-CM | POA: Diagnosis not present

## 2023-12-11 DIAGNOSIS — Z79899 Other long term (current) drug therapy: Secondary | ICD-10-CM | POA: Diagnosis not present

## 2023-12-11 DIAGNOSIS — Z7189 Other specified counseling: Secondary | ICD-10-CM | POA: Diagnosis not present

## 2023-12-11 DIAGNOSIS — Z794 Long term (current) use of insulin: Secondary | ICD-10-CM | POA: Diagnosis not present

## 2023-12-11 DIAGNOSIS — Z992 Dependence on renal dialysis: Secondary | ICD-10-CM | POA: Diagnosis not present

## 2023-12-12 DIAGNOSIS — I4891 Unspecified atrial fibrillation: Secondary | ICD-10-CM | POA: Diagnosis not present

## 2023-12-12 DIAGNOSIS — D631 Anemia in chronic kidney disease: Secondary | ICD-10-CM | POA: Diagnosis not present

## 2023-12-12 DIAGNOSIS — E785 Hyperlipidemia, unspecified: Secondary | ICD-10-CM | POA: Diagnosis not present

## 2023-12-12 DIAGNOSIS — N186 End stage renal disease: Secondary | ICD-10-CM | POA: Diagnosis not present

## 2023-12-12 DIAGNOSIS — I5032 Chronic diastolic (congestive) heart failure: Secondary | ICD-10-CM | POA: Diagnosis not present

## 2023-12-12 DIAGNOSIS — N751 Abscess of Bartholin's gland: Secondary | ICD-10-CM | POA: Diagnosis not present

## 2023-12-12 DIAGNOSIS — Z992 Dependence on renal dialysis: Secondary | ICD-10-CM | POA: Diagnosis not present

## 2023-12-12 DIAGNOSIS — E559 Vitamin D deficiency, unspecified: Secondary | ICD-10-CM | POA: Diagnosis not present

## 2023-12-12 DIAGNOSIS — E1142 Type 2 diabetes mellitus with diabetic polyneuropathy: Secondary | ICD-10-CM | POA: Diagnosis not present

## 2023-12-12 DIAGNOSIS — A499 Bacterial infection, unspecified: Secondary | ICD-10-CM | POA: Diagnosis not present

## 2023-12-13 ENCOUNTER — Encounter (HOSPITAL_COMMUNITY): Payer: Self-pay

## 2023-12-13 ENCOUNTER — Emergency Department (HOSPITAL_COMMUNITY)
Admission: EM | Admit: 2023-12-13 | Discharge: 2023-12-13 | Disposition: A | Discharge status: Home / Self Care | Attending: Emergency Medicine | Admitting: Emergency Medicine

## 2023-12-13 DIAGNOSIS — Z7901 Long term (current) use of anticoagulants: Secondary | ICD-10-CM | POA: Insufficient documentation

## 2023-12-13 DIAGNOSIS — E43 Unspecified severe protein-calorie malnutrition: Secondary | ICD-10-CM | POA: Diagnosis not present

## 2023-12-13 DIAGNOSIS — Z79899 Other long term (current) drug therapy: Secondary | ICD-10-CM | POA: Insufficient documentation

## 2023-12-13 DIAGNOSIS — Z992 Dependence on renal dialysis: Secondary | ICD-10-CM | POA: Diagnosis not present

## 2023-12-13 DIAGNOSIS — R531 Weakness: Secondary | ICD-10-CM | POA: Diagnosis not present

## 2023-12-13 DIAGNOSIS — N898 Other specified noninflammatory disorders of vagina: Secondary | ICD-10-CM | POA: Insufficient documentation

## 2023-12-13 DIAGNOSIS — N186 End stage renal disease: Secondary | ICD-10-CM | POA: Diagnosis not present

## 2023-12-13 DIAGNOSIS — R110 Nausea: Secondary | ICD-10-CM | POA: Diagnosis not present

## 2023-12-13 DIAGNOSIS — E860 Dehydration: Secondary | ICD-10-CM | POA: Diagnosis not present

## 2023-12-13 DIAGNOSIS — I959 Hypotension, unspecified: Secondary | ICD-10-CM | POA: Diagnosis not present

## 2023-12-13 DIAGNOSIS — Z794 Long term (current) use of insulin: Secondary | ICD-10-CM | POA: Insufficient documentation

## 2023-12-13 DIAGNOSIS — R42 Dizziness and giddiness: Secondary | ICD-10-CM

## 2023-12-13 LAB — BASIC METABOLIC PANEL WITH GFR
Anion gap: 15 (ref 5–15)
BUN: 38 mg/dL — ABNORMAL HIGH (ref 8–23)
CO2: 23 mmol/L (ref 22–32)
Calcium: 8.4 mg/dL — ABNORMAL LOW (ref 8.9–10.3)
Chloride: 94 mmol/L — ABNORMAL LOW (ref 98–111)
Creatinine, Ser: 4.91 mg/dL — ABNORMAL HIGH (ref 0.44–1.00)
GFR, Estimated: 8 mL/min — ABNORMAL LOW (ref >60)
Glucose, Bld: 140 mg/dL — ABNORMAL HIGH (ref 70–99)
Potassium: 3.2 mmol/L — ABNORMAL LOW (ref 3.5–5.1)
Sodium: 132 mmol/L — ABNORMAL LOW (ref 135–145)

## 2023-12-13 LAB — CBC WITH DIFFERENTIAL/PLATELET
Abs Immature Granulocytes: 0.03 K/uL (ref 0.00–0.07)
Basophils Absolute: 0 K/uL (ref 0.0–0.1)
Basophils Relative: 1 %
Eosinophils Absolute: 0.2 K/uL (ref 0.0–0.5)
Eosinophils Relative: 4 %
HCT: 43.8 % (ref 36.0–46.0)
Hemoglobin: 13.7 g/dL (ref 12.0–15.0)
Immature Granulocytes: 1 %
Lymphocytes Relative: 28 %
Lymphs Abs: 1.6 K/uL (ref 0.7–4.0)
MCH: 27.7 pg (ref 26.0–34.0)
MCHC: 31.3 g/dL (ref 30.0–36.0)
MCV: 88.7 fL (ref 80.0–100.0)
Monocytes Absolute: 0.4 K/uL (ref 0.1–1.0)
Monocytes Relative: 7 %
Neutro Abs: 3.6 K/uL (ref 1.7–7.7)
Neutrophils Relative %: 59 %
Platelets: 228 K/uL (ref 150–400)
RBC: 4.94 MIL/uL (ref 3.87–5.11)
RDW: 18.4 % — ABNORMAL HIGH (ref 11.5–15.5)
WBC: 6 K/uL (ref 4.0–10.5)
nRBC: 0 % (ref 0.0–0.2)

## 2023-12-13 MED ORDER — POTASSIUM CHLORIDE CRYS ER 20 MEQ PO TBCR
10.0000 meq | EXTENDED_RELEASE_TABLET | Freq: Once | ORAL | Status: AC
  Administered 2023-12-13: 10 meq via ORAL
  Filled 2023-12-13: qty 1

## 2023-12-13 MED ORDER — CEPHALEXIN 500 MG PO CAPS: 500.0000 mg | ORAL_CAPSULE | Freq: Three times a day (TID) | ORAL | 0 refills | Status: AC

## 2023-12-13 NOTE — ED Notes (Signed)
 Update given to daughter via phone. Pt denies any needs at this time. Still awaiting transport via [facility].

## 2023-12-13 NOTE — Discharge Instructions (Signed)
 Follow-up with your family doctor next week for recheck.  Continue getting her dialysis

## 2023-12-13 NOTE — ED Triage Notes (Signed)
 Patient presents to ER via [facility], patient went to dialysis today treatment duration only hour and half. Patient reports dizziness and diastolic blood pressure decreased. Denies any chest pain or shortness of breath.

## 2023-12-13 NOTE — ED Provider Notes (Signed)
 [place] EMERGENCY DEPARTMENT AT [facility] Provider Note   CSN: [id number] Arrival date & time: 12/13/23  1149     Patient presents with: Dizziness   Chelsea Meza is a 88 y.o. female.   Patient was at dialysis and became a little dizzy and weak.  Her blood pressure is low.  Also her daughter states that she had an abscess that was drained near her vagina.  According to the daughter it is still leaking.  And she was supposed to be put on antibiotics but never got them  The history is provided by the patient and medical records. No language interpreter was used.  Dizziness Quality:  Lightheadedness Severity:  Mild Onset quality:  Sudden Timing:  Intermittent Progression:  Resolved Chronicity:  Recurrent Context: not when bending over   Relieved by:  Nothing Worsened by:  Nothing Ineffective treatments:  None tried Associated symptoms: no blood in stool, no chest pain, no diarrhea and no headaches        Prior to Admission medications   Medication Sig Start Date End Date Taking? Authorizing Provider  acetaminophen  (TYLENOL ) 325 MG tablet Take 2 tablets (650 mg total) by mouth every 6 (six) hours as needed for mild pain, fever or headache (or Fever >/= 101). 01/08/22  Yes Emokpae, Courage, MD  Amino Acids-Protein Hydrolys (FEEDING SUPPLEMENT, PRO-STAT SUGAR FREE 64,) LIQD Take 30 mLs by mouth every evening. 1800   Yes [provider]  amLODipine  (NORVASC ) 5 MG tablet Take 5 mg by mouth daily. 09/22/23  Yes [provider]  amoxicillin-clavulanate (AUGMENTIN) 250-125 MG tablet Take 1 tablet by mouth 2 (two) times daily. 12/13/23 12/20/23 Yes [provider]  apixaban  (ELIQUIS ) 2.5 MG TABS tablet Take 1 tablet (2.5 mg total) by mouth 2 (two) times daily. 04/13/22 11/08/26 Yes Kommor, Madison, MD  ascorbic acid (VITAMIN C) 500 MG tablet Take 500 mg by mouth 2 (two) times daily.   Yes [provider]  atorvastatin  (LIPITOR) 40 MG  tablet Take 40 mg by mouth at bedtime. (2100) 10/19/21  Yes [provider]  carvedilol  (COREG ) 3.125 MG tablet Take 1 tablet (3.125 mg total) by mouth 2 (two) times daily with a meal. 01/08/22 11/08/26 Yes Emokpae, Courage, MD  cephALEXin  (KEFLEX ) 500 MG capsule Take 1 capsule (500 mg total) by mouth 3 (three) times daily. 12/13/23  Yes Osmel Dykstra, MD  Cholecalciferol (VITAMIN D3 SUPER STRENGTH) 50 MCG (2000 UT) TABS Take 2,000 Units by mouth in the morning. (0900)   Yes [provider]  clotrimazole-betamethasone (LOTRISONE) cream Apply 1 Application topically See admin instructions. Apply under breasts topically every evening shift ever Wednesday and Saturday for management on chronic fungal rash after scheduled shower and every 12 hours as needed for chronic fungal rash. 08/23/23  Yes [provider]  furosemide  (LASIX ) 40 MG tablet Take 1 tablet (40 mg total) by mouth daily. 01/08/22  Yes Emokpae, Courage, MD  hydrALAZINE  (APRESOLINE ) 50 MG tablet Take 50 mg by mouth every 8 (eight) hours. 09/22/23  Yes [provider]  Hydrocortisone (ANUSOL-HC EX) Apply 1 Application topically every 8 (eight) hours as needed (hemorrhoids).   Yes [provider]  insulin  lispro (HUMALOG ) 100 UNIT/ML injection Inject 4 Units into the skin with breakfast, with lunch, and with evening meal. Hold for FSBS less than 150 or if not eating 0800, 1300, 1700 01/09/22  Yes [provider]  MELATONIN PO Take 6 mg by mouth at bedtime as needed (insomnia). (2200)  Yes [provider]  Menthol-Zinc Oxide 0.44-20.6 % OINT Apply 1 Application topically in the morning, at noon, and at bedtime. Apply to buttocks, gluteal folds topically every shift for moisture associated skin damage after incontinence care.   Yes [provider]  mineral oil enema Place 1 enema rectally every 8 (eight) hours as needed for moderate constipation.   Yes [provider]   Multiple Vitamin (MULTIVITAMIN WITH MINERALS) TABS tablet Take 1 tablet by mouth in the morning. (0800)   Yes [provider]  ondansetron  (ZOFRAN -ODT) 4 MG disintegrating tablet Take 4 mg by mouth every 4 (four) hours as needed for nausea or vomiting. For 2 doses. If not resolved after 2 doses then Phenergan 25 mg (IM, PR, or per tube) PRN   Yes [provider]  senna-docusate (SENOKOT-S) 8.6-50 MG tablet Take 1 tablet by mouth daily. Patient taking differently: Take 1 tablet by mouth in the morning. 0900 04/15/22  Yes Garrick Charleston, MD  sevelamer carbonate (RENVELA) 800 MG tablet Take 800 mg by mouth 3 (three) times daily with meals.   Yes [provider]  sodium zirconium cyclosilicate  (LOKELMA ) 10 g PACK packet Take 10 g by mouth every Monday, Wednesday, and Friday. 12/26/21  Yes Tat, Alm, MD  zinc sulfate, 50mg  elemental zinc, 220 (50 Zn) MG capsule Take 220 mg by mouth daily.   Yes [provider]  Blood Glucose Monitoring Suppl (ACCU-CHEK GUIDE) w/Device KIT USE AS DIRECTED.I 09/03/20   [provider]    Allergies: Patient has no known allergies.    Review of Systems  Constitutional:  Negative for appetite change and fatigue.  HENT:  Negative for congestion, ear discharge and sinus pressure.   Eyes:  Negative for discharge.  Respiratory:  Negative for cough.   Cardiovascular:  Negative for chest pain.  Gastrointestinal:  Negative for abdominal pain, blood in stool and diarrhea.  Genitourinary:  Negative for frequency and hematuria.  Musculoskeletal:  Negative for back pain.  Skin:  Negative for rash.       Drainage near vagina  Neurological:  Positive for dizziness. Negative for seizures and headaches.  Psychiatric/Behavioral:  Negative for hallucinations.     Updated Vital Signs BP (!) 157/96   Pulse 71   Temp (!) 97.5 F (36.4 C) (Oral)   Resp (!) 22   Ht 5' 7 (1.702 m)   Wt 81.6 kg   SpO2 97%   BMI 28.18 kg/m    Physical Exam Vitals and nursing note reviewed.  Constitutional:      Appearance: She is well-developed.  HENT:     Head: Normocephalic.     Nose: Nose normal.  Eyes:     General: No scleral icterus.    Conjunctiva/sclera: Conjunctivae normal.  Neck:     Thyroid : No thyromegaly.  Cardiovascular:     Rate and Rhythm: Normal rate and regular rhythm.     Heart sounds: No murmur heard.    No friction rub. No gallop.  Pulmonary:     Breath sounds: No stridor. No wheezing or rales.  Chest:     Chest wall: No tenderness.  Abdominal:     General: There is no distension.     Tenderness: There is no abdominal tenderness. There is no rebound.  Genitourinary:    Comments: Healing skin abscess near vagina with minimal drainage Musculoskeletal:        General: Normal range of motion.     Cervical back: Neck supple.  Lymphadenopathy:  Cervical: No cervical adenopathy.  Skin:    Findings: No erythema or rash.  Neurological:     Mental Status: She is alert and oriented to person, place, and time.     Motor: No abnormal muscle tone.     Coordination: Coordination normal.  Psychiatric:        Behavior: Behavior normal.     (all labs ordered are listed, but only abnormal results are displayed) Labs Reviewed  CBC WITH DIFFERENTIAL/PLATELET - Abnormal; Notable for the following components:      Result Value   RDW 18.4 (*)    All other components within normal limits  BASIC METABOLIC PANEL WITH GFR - Abnormal; Notable for the following components:   Sodium 132 (*)    Potassium 3.2 (*)    Chloride 94 (*)    Glucose, Bld 140 (*)    BUN 38 (*)    Creatinine, Ser 4.91 (*)    Calcium  8.4 (*)    GFR, Estimated 8 (*)    All other components within normal limits    EKG: None  Radiology: No results found.   Procedures   Medications Ordered in the ED  potassium chloride  SA (KLOR-CON  M) CR tablet 10 mEq (has no administration in time range)                                     Medical Decision Making Amount and/or Complexity of Data Reviewed Labs: ordered.  Risk Prescription drug management.   Dizziness and hypotension at dialysis.  Patient improved in the ER.  Probably dehydration from dialysis treatment.  Status post I&D of abscess.  Still having minimal drainage.  She will be placed on Keflex      Final diagnoses:  Dizziness  Dehydration    ED Discharge Orders          Ordered    cephALEXin  (KEFLEX ) 500 MG capsule  3 times daily        12/13/23 1529               Suzette Pac, MD 12/14/23 1523

## 2023-12-15 DIAGNOSIS — N186 End stage renal disease: Secondary | ICD-10-CM | POA: Diagnosis not present

## 2023-12-15 DIAGNOSIS — E43 Unspecified severe protein-calorie malnutrition: Secondary | ICD-10-CM | POA: Diagnosis not present

## 2023-12-18 DIAGNOSIS — E43 Unspecified severe protein-calorie malnutrition: Secondary | ICD-10-CM | POA: Diagnosis not present

## 2023-12-18 DIAGNOSIS — N186 End stage renal disease: Secondary | ICD-10-CM | POA: Diagnosis not present

## 2023-12-19 DIAGNOSIS — Z992 Dependence on renal dialysis: Secondary | ICD-10-CM | POA: Diagnosis not present

## 2023-12-19 DIAGNOSIS — N186 End stage renal disease: Secondary | ICD-10-CM | POA: Diagnosis not present

## 2023-12-20 DIAGNOSIS — N186 End stage renal disease: Secondary | ICD-10-CM | POA: Diagnosis not present

## 2023-12-20 DIAGNOSIS — E43 Unspecified severe protein-calorie malnutrition: Secondary | ICD-10-CM | POA: Diagnosis not present

## 2023-12-22 DIAGNOSIS — Z992 Dependence on renal dialysis: Secondary | ICD-10-CM | POA: Diagnosis not present

## 2023-12-22 DIAGNOSIS — N186 End stage renal disease: Secondary | ICD-10-CM | POA: Diagnosis not present

## 2023-12-22 DIAGNOSIS — E43 Unspecified severe protein-calorie malnutrition: Secondary | ICD-10-CM | POA: Diagnosis not present

## 2023-12-24 DIAGNOSIS — E1151 Type 2 diabetes mellitus with diabetic peripheral angiopathy without gangrene: Secondary | ICD-10-CM | POA: Diagnosis not present

## 2023-12-25 DIAGNOSIS — Z992 Dependence on renal dialysis: Secondary | ICD-10-CM | POA: Diagnosis not present

## 2023-12-25 DIAGNOSIS — N186 End stage renal disease: Secondary | ICD-10-CM | POA: Diagnosis not present

## 2023-12-25 DIAGNOSIS — E43 Unspecified severe protein-calorie malnutrition: Secondary | ICD-10-CM | POA: Diagnosis not present

## 2023-12-27 DIAGNOSIS — Z992 Dependence on renal dialysis: Secondary | ICD-10-CM | POA: Diagnosis not present

## 2023-12-27 DIAGNOSIS — E43 Unspecified severe protein-calorie malnutrition: Secondary | ICD-10-CM | POA: Diagnosis not present

## 2023-12-27 DIAGNOSIS — N186 End stage renal disease: Secondary | ICD-10-CM | POA: Diagnosis not present

## 2023-12-29 DIAGNOSIS — E43 Unspecified severe protein-calorie malnutrition: Secondary | ICD-10-CM | POA: Diagnosis not present

## 2023-12-29 DIAGNOSIS — N186 End stage renal disease: Secondary | ICD-10-CM | POA: Diagnosis not present

## 2023-12-30 DIAGNOSIS — Z992 Dependence on renal dialysis: Secondary | ICD-10-CM | POA: Diagnosis not present

## 2023-12-30 DIAGNOSIS — N186 End stage renal disease: Secondary | ICD-10-CM | POA: Diagnosis not present

## 2023-12-31 DIAGNOSIS — N186 End stage renal disease: Secondary | ICD-10-CM | POA: Diagnosis not present

## 2023-12-31 DIAGNOSIS — Z992 Dependence on renal dialysis: Secondary | ICD-10-CM | POA: Diagnosis not present

## 2024-01-01 DIAGNOSIS — E43 Unspecified severe protein-calorie malnutrition: Secondary | ICD-10-CM | POA: Diagnosis not present

## 2024-01-01 DIAGNOSIS — N186 End stage renal disease: Secondary | ICD-10-CM | POA: Diagnosis not present

## 2024-01-03 DIAGNOSIS — N186 End stage renal disease: Secondary | ICD-10-CM | POA: Diagnosis not present

## 2024-01-03 DIAGNOSIS — E43 Unspecified severe protein-calorie malnutrition: Secondary | ICD-10-CM | POA: Diagnosis not present

## 2024-01-05 DIAGNOSIS — N186 End stage renal disease: Secondary | ICD-10-CM | POA: Diagnosis not present

## 2024-01-05 DIAGNOSIS — E43 Unspecified severe protein-calorie malnutrition: Secondary | ICD-10-CM | POA: Diagnosis not present

## 2024-01-08 DIAGNOSIS — N186 End stage renal disease: Secondary | ICD-10-CM | POA: Diagnosis not present

## 2024-01-08 DIAGNOSIS — E43 Unspecified severe protein-calorie malnutrition: Secondary | ICD-10-CM | POA: Diagnosis not present

## 2024-01-10 DIAGNOSIS — E43 Unspecified severe protein-calorie malnutrition: Secondary | ICD-10-CM | POA: Diagnosis not present

## 2024-01-10 DIAGNOSIS — N186 End stage renal disease: Secondary | ICD-10-CM | POA: Diagnosis not present

## 2024-01-12 DIAGNOSIS — N186 End stage renal disease: Secondary | ICD-10-CM | POA: Diagnosis not present

## 2024-01-14 DIAGNOSIS — Z794 Long term (current) use of insulin: Secondary | ICD-10-CM | POA: Diagnosis not present

## 2024-01-14 DIAGNOSIS — N186 End stage renal disease: Secondary | ICD-10-CM | POA: Diagnosis not present

## 2024-01-14 DIAGNOSIS — Z79899 Other long term (current) drug therapy: Secondary | ICD-10-CM | POA: Diagnosis not present

## 2024-01-14 DIAGNOSIS — Z992 Dependence on renal dialysis: Secondary | ICD-10-CM | POA: Diagnosis not present

## 2024-01-14 DIAGNOSIS — I5032 Chronic diastolic (congestive) heart failure: Secondary | ICD-10-CM | POA: Diagnosis not present

## 2024-01-14 DIAGNOSIS — E1122 Type 2 diabetes mellitus with diabetic chronic kidney disease: Secondary | ICD-10-CM | POA: Diagnosis not present

## 2024-01-15 DIAGNOSIS — E43 Unspecified severe protein-calorie malnutrition: Secondary | ICD-10-CM | POA: Diagnosis not present

## 2024-01-15 DIAGNOSIS — Z992 Dependence on renal dialysis: Secondary | ICD-10-CM | POA: Diagnosis not present

## 2024-01-15 DIAGNOSIS — N186 End stage renal disease: Secondary | ICD-10-CM | POA: Diagnosis not present

## 2024-01-17 DIAGNOSIS — I4891 Unspecified atrial fibrillation: Secondary | ICD-10-CM | POA: Diagnosis not present

## 2024-01-17 DIAGNOSIS — Z79899 Other long term (current) drug therapy: Secondary | ICD-10-CM | POA: Diagnosis not present

## 2024-01-17 DIAGNOSIS — E1142 Type 2 diabetes mellitus with diabetic polyneuropathy: Secondary | ICD-10-CM | POA: Diagnosis not present

## 2024-01-17 DIAGNOSIS — E785 Hyperlipidemia, unspecified: Secondary | ICD-10-CM | POA: Diagnosis not present

## 2024-01-17 DIAGNOSIS — D631 Anemia in chronic kidney disease: Secondary | ICD-10-CM | POA: Diagnosis not present

## 2024-01-17 DIAGNOSIS — Z992 Dependence on renal dialysis: Secondary | ICD-10-CM | POA: Diagnosis not present

## 2024-01-17 DIAGNOSIS — E559 Vitamin D deficiency, unspecified: Secondary | ICD-10-CM | POA: Diagnosis not present

## 2024-01-17 DIAGNOSIS — E1122 Type 2 diabetes mellitus with diabetic chronic kidney disease: Secondary | ICD-10-CM | POA: Diagnosis not present

## 2024-01-17 DIAGNOSIS — N186 End stage renal disease: Secondary | ICD-10-CM | POA: Diagnosis not present

## 2024-01-17 DIAGNOSIS — N751 Abscess of Bartholin's gland: Secondary | ICD-10-CM | POA: Diagnosis not present

## 2024-01-17 DIAGNOSIS — Z794 Long term (current) use of insulin: Secondary | ICD-10-CM | POA: Diagnosis not present

## 2024-01-19 DIAGNOSIS — Z992 Dependence on renal dialysis: Secondary | ICD-10-CM | POA: Diagnosis not present

## 2024-01-19 DIAGNOSIS — N186 End stage renal disease: Secondary | ICD-10-CM | POA: Diagnosis not present

## 2024-01-22 DIAGNOSIS — I132 Hypertensive heart and chronic kidney disease with heart failure and with stage 5 chronic kidney disease, or end stage renal disease: Secondary | ICD-10-CM | POA: Diagnosis not present

## 2024-01-22 DIAGNOSIS — N186 End stage renal disease: Secondary | ICD-10-CM | POA: Diagnosis not present

## 2024-01-22 DIAGNOSIS — R278 Other lack of coordination: Secondary | ICD-10-CM | POA: Diagnosis not present

## 2024-01-22 DIAGNOSIS — E43 Unspecified severe protein-calorie malnutrition: Secondary | ICD-10-CM | POA: Diagnosis not present

## 2024-01-23 DIAGNOSIS — I132 Hypertensive heart and chronic kidney disease with heart failure and with stage 5 chronic kidney disease, or end stage renal disease: Secondary | ICD-10-CM | POA: Diagnosis not present

## 2024-01-23 DIAGNOSIS — R278 Other lack of coordination: Secondary | ICD-10-CM | POA: Diagnosis not present

## 2024-01-24 DIAGNOSIS — N186 End stage renal disease: Secondary | ICD-10-CM | POA: Diagnosis not present

## 2024-01-24 DIAGNOSIS — R278 Other lack of coordination: Secondary | ICD-10-CM | POA: Diagnosis not present

## 2024-01-24 DIAGNOSIS — M6281 Muscle weakness (generalized): Secondary | ICD-10-CM | POA: Diagnosis not present

## 2024-01-24 DIAGNOSIS — Z992 Dependence on renal dialysis: Secondary | ICD-10-CM | POA: Diagnosis not present

## 2024-01-24 DIAGNOSIS — I132 Hypertensive heart and chronic kidney disease with heart failure and with stage 5 chronic kidney disease, or end stage renal disease: Secondary | ICD-10-CM | POA: Diagnosis not present

## 2024-01-25 DIAGNOSIS — I132 Hypertensive heart and chronic kidney disease with heart failure and with stage 5 chronic kidney disease, or end stage renal disease: Secondary | ICD-10-CM | POA: Diagnosis not present

## 2024-01-25 DIAGNOSIS — R278 Other lack of coordination: Secondary | ICD-10-CM | POA: Diagnosis not present

## 2024-01-26 DIAGNOSIS — Z992 Dependence on renal dialysis: Secondary | ICD-10-CM | POA: Diagnosis not present

## 2024-01-26 DIAGNOSIS — N186 End stage renal disease: Secondary | ICD-10-CM | POA: Diagnosis not present

## 2024-01-28 DIAGNOSIS — I132 Hypertensive heart and chronic kidney disease with heart failure and with stage 5 chronic kidney disease, or end stage renal disease: Secondary | ICD-10-CM | POA: Diagnosis not present

## 2024-01-28 DIAGNOSIS — R278 Other lack of coordination: Secondary | ICD-10-CM | POA: Diagnosis not present

## 2024-01-29 DIAGNOSIS — Z992 Dependence on renal dialysis: Secondary | ICD-10-CM | POA: Diagnosis not present

## 2024-01-29 DIAGNOSIS — E43 Unspecified severe protein-calorie malnutrition: Secondary | ICD-10-CM | POA: Diagnosis not present

## 2024-03-07 ENCOUNTER — Emergency Department (HOSPITAL_COMMUNITY)
Admission: EM | Admit: 2024-03-07 | Discharge: 2024-03-07 | Disposition: A | Discharge status: Home / Self Care | Source: Other Acute Inpatient Hospital

## 2024-03-07 ENCOUNTER — Encounter (HOSPITAL_COMMUNITY): Payer: Self-pay | Admitting: Emergency Medicine

## 2024-03-07 ENCOUNTER — Other Ambulatory Visit: Payer: Self-pay

## 2024-03-07 DIAGNOSIS — Z794 Long term (current) use of insulin: Secondary | ICD-10-CM | POA: Diagnosis not present

## 2024-03-07 DIAGNOSIS — Z91158 Patient's noncompliance with renal dialysis for other reason: Secondary | ICD-10-CM | POA: Insufficient documentation

## 2024-03-07 DIAGNOSIS — Z7901 Long term (current) use of anticoagulants: Secondary | ICD-10-CM | POA: Diagnosis not present

## 2024-03-07 DIAGNOSIS — N186 End stage renal disease: Secondary | ICD-10-CM | POA: Diagnosis present

## 2024-03-07 LAB — CBC WITH DIFFERENTIAL/PLATELET
Abs Immature Granulocytes: 0.04 K/uL (ref 0.00–0.07)
Basophils Absolute: 0.1 K/uL (ref 0.0–0.1)
Basophils Relative: 1 %
Eosinophils Absolute: 0.3 K/uL (ref 0.0–0.5)
Eosinophils Relative: 4 %
HCT: 30.3 % — ABNORMAL LOW (ref 36.0–46.0)
Hemoglobin: 9.5 g/dL — ABNORMAL LOW (ref 12.0–15.0)
Immature Granulocytes: 1 %
Lymphocytes Relative: 31 %
Lymphs Abs: 2.6 K/uL (ref 0.7–4.0)
MCH: 29.1 pg (ref 26.0–34.0)
MCHC: 31.4 g/dL (ref 30.0–36.0)
MCV: 92.7 fL (ref 80.0–100.0)
Monocytes Absolute: 0.4 K/uL (ref 0.1–1.0)
Monocytes Relative: 5 %
Neutro Abs: 5.1 K/uL (ref 1.7–7.7)
Neutrophils Relative %: 58 %
Platelets: 264 K/uL (ref 150–400)
RBC: 3.27 MIL/uL — ABNORMAL LOW (ref 3.87–5.11)
RDW: 14.9 % (ref 11.5–15.5)
WBC: 8.5 K/uL (ref 4.0–10.5)
nRBC: 0 % (ref 0.0–0.2)

## 2024-03-07 LAB — BASIC METABOLIC PANEL WITH GFR
Anion gap: 17 — ABNORMAL HIGH (ref 5–15)
BUN: 86 mg/dL — ABNORMAL HIGH (ref 8–23)
CO2: 23 mmol/L (ref 22–32)
Calcium: 8.6 mg/dL — ABNORMAL LOW (ref 8.9–10.3)
Chloride: 94 mmol/L — ABNORMAL LOW (ref 98–111)
Creatinine, Ser: 9.11 mg/dL — ABNORMAL HIGH (ref 0.44–1.00)
GFR, Estimated: 4 mL/min — ABNORMAL LOW (ref >60)
Glucose, Bld: 78 mg/dL (ref 70–99)
Potassium: 4.9 mmol/L (ref 3.5–5.1)
Sodium: 134 mmol/L — ABNORMAL LOW (ref 135–145)

## 2024-03-07 LAB — HEPATITIS B SURFACE ANTIGEN: Hepatitis B Surface Ag: NONREACTIVE

## 2024-03-07 MED ORDER — ANTICOAGULANT SODIUM CITRATE 4% (200MG/5ML) IV SOLN: 5.0000 mL | Status: DC | PRN

## 2024-03-07 MED ORDER — HEPARIN SODIUM (PORCINE) 1000 UNIT/ML IJ SOLN
INTRAMUSCULAR | Status: AC
  Filled 2024-03-07: qty 1

## 2024-03-07 MED ORDER — HEPARIN SODIUM (PORCINE) 1000 UNIT/ML DIALYSIS
1000.0000 [IU] | INTRAMUSCULAR | Status: DC | PRN
Start: 2024-03-07 — End: 2024-03-07

## 2024-03-07 MED ORDER — LIDOCAINE-PRILOCAINE 2.5-2.5 % EX CREA: 1.0000 | TOPICAL_CREAM | CUTANEOUS | Status: DC | PRN

## 2024-03-07 MED ORDER — LIDOCAINE HCL (PF) 1 % IJ SOLN: 5.0000 mL | INTRAMUSCULAR | Status: DC | PRN

## 2024-03-07 MED ORDER — PENTAFLUOROPROP-TETRAFLUOROETH EX AERO: 1.0000 | INHALATION_SPRAY | CUTANEOUS | Status: DC | PRN

## 2024-03-07 MED ORDER — CHLORHEXIDINE GLUCONATE CLOTH 2 % EX PADS: 6.0000 | MEDICATED_PAD | Freq: Every day | CUTANEOUS | Status: DC

## 2024-03-07 MED ORDER — ALTEPLASE 2 MG IJ SOLR: 2.0000 mg | Freq: Once | INTRAMUSCULAR | Status: DC | PRN

## 2024-03-07 NOTE — ED Notes (Signed)
 Pt transported to Dialysis ?

## 2024-03-07 NOTE — ED Provider Notes (Signed)
  EMERGENCY DEPARTMENT AT [facility] Provider Note   CSN: [id number] Arrival date & time: 03/07/24  1302     Patient presents with: Needs Dialysis    Chelsea Meza is a 88 y.o. female.   88 year old female presents for evaluation of needing dialysis.  She states she has not gone in a week.  She was sent in by the nursing home as she has been noncompliant with her dialysis.  Patient states that she is willing to receive dialysis, just does not like to wait at the dialysis center because sometimes her chair is not ready and sometimes if she has had a bowel movement they will do her dialysis.  She denies any other symptoms or concerns.        Prior to Admission medications   Medication Sig Start Date End Date Taking? Authorizing Provider  acetaminophen  (TYLENOL ) 325 MG tablet Take 2 tablets (650 mg total) by mouth every 6 (six) hours as needed for mild pain, fever or headache (or Fever >/= 101). 01/08/22   Pearlean Manus, MD  Amino Acids-Protein Hydrolys (FEEDING SUPPLEMENT, PRO-STAT SUGAR FREE 64,) LIQD Take 30 mLs by mouth every evening. 1800    [provider]  amLODipine  (NORVASC ) 5 MG tablet Take 5 mg by mouth daily. 09/22/23   [provider]  apixaban  (ELIQUIS ) 2.5 MG TABS tablet Take 1 tablet (2.5 mg total) by mouth 2 (two) times daily. 04/13/22 11/08/26  Kommor, Madison, MD  ascorbic acid (VITAMIN C) 500 MG tablet Take 500 mg by mouth 2 (two) times daily.    [provider]  atorvastatin  (LIPITOR) 40 MG tablet Take 40 mg by mouth at bedtime. (2100) 10/19/21   [provider]  Blood Glucose Monitoring Suppl (ACCU-CHEK GUIDE) w/Device KIT USE AS DIRECTED.I 09/03/20   [provider]  carvedilol  (COREG ) 3.125 MG tablet Take 1 tablet (3.125 mg total) by mouth 2 (two) times daily with a meal. 01/08/22 11/08/26  Pearlean Manus, MD  cephALEXin  (KEFLEX ) 500 MG capsule Take 1 capsule (500 mg total) by mouth 3 (three)  times daily. 12/13/23   Suzette Pac, MD  Cholecalciferol (VITAMIN D3 SUPER STRENGTH) 50 MCG (2000 UT) TABS Take 2,000 Units by mouth in the morning. (0900)    [provider]  clotrimazole-betamethasone (LOTRISONE) cream Apply 1 Application topically See admin instructions. Apply under breasts topically every evening shift ever Wednesday and Saturday for management on chronic fungal rash after scheduled shower and every 12 hours as needed for chronic fungal rash. 08/23/23   [provider]  furosemide  (LASIX ) 40 MG tablet Take 1 tablet (40 mg total) by mouth daily. 01/08/22   Pearlean Manus, MD  hydrALAZINE  (APRESOLINE ) 50 MG tablet Take 50 mg by mouth every 8 (eight) hours. 09/22/23   [provider]  Hydrocortisone (ANUSOL-HC EX) Apply 1 Application topically every 8 (eight) hours as needed (hemorrhoids).    [provider]  insulin  lispro (HUMALOG ) 100 UNIT/ML injection Inject 4 Units into the skin with breakfast, with lunch, and with evening meal. Hold for FSBS less than 150 or if not eating 0800, 1300, 1700 01/09/22   [provider]  MELATONIN PO Take 6 mg by mouth at bedtime as needed (insomnia). (2200)    [provider]  Menthol-Zinc Oxide 0.44-20.6 % OINT Apply 1 Application topically in the morning, at noon, and at bedtime. Apply to buttocks, gluteal folds topically every shift for moisture associated skin damage after incontinence care.    [provider]  mineral oil enema Place 1 enema rectally every 8 (eight) hours as needed for moderate constipation.    [provider]  Multiple Vitamin (MULTIVITAMIN WITH MINERALS) TABS tablet Take 1 tablet by mouth in the morning. (0800)    [provider]  ondansetron  (ZOFRAN -ODT) 4 MG disintegrating tablet Take 4 mg by mouth every 4 (four) hours as needed for nausea or vomiting. For 2 doses. If not resolved after 2 doses then Phenergan 25 mg (IM, PR, or per tube) PRN     [provider]  senna-docusate (SENOKOT-S) 8.6-50 MG tablet Take 1 tablet by mouth daily. Patient taking differently: Take 1 tablet by mouth in the morning. 0900 04/15/22   Garrick Charleston, MD  sevelamer carbonate (RENVELA) 800 MG tablet Take 800 mg by mouth 3 (three) times daily with meals.    [provider]  sodium zirconium cyclosilicate  (LOKELMA ) 10 g PACK packet Take 10 g by mouth every Monday, Wednesday, and Friday. 12/26/21   Evonnie Lenis, MD  zinc sulfate, 50mg  elemental zinc, 220 (50 Zn) MG capsule Take 220 mg by mouth daily.    [provider]    Allergies: Patient has no known allergies.    Review of Systems  Constitutional:  Negative for chills and fever.  HENT:  Negative for ear pain and sore throat.   Eyes:  Negative for pain and visual disturbance.  Respiratory:  Negative for cough and shortness of breath.   Cardiovascular:  Negative for chest pain and palpitations.  Gastrointestinal:  Negative for abdominal pain and vomiting.  Genitourinary:  Negative for dysuria and hematuria.  Musculoskeletal:  Negative for arthralgias and back pain.  Skin:  Negative for color change and rash.  Neurological:  Negative for seizures and syncope.  All other systems reviewed and are negative.   Updated Vital Signs BP 129/77   Pulse 66   Temp 97.6 F (36.4 C) (Oral)   Resp 19   Ht 5' 7 (1.702 m)   Wt 84 kg   SpO2 97%   BMI 29.00 kg/m   Physical Exam Vitals and nursing note reviewed.  Constitutional:      General: She is not in acute distress.    Appearance: She is well-developed.  HENT:     Head: Normocephalic and atraumatic.  Eyes:     Conjunctiva/sclera: Conjunctivae normal.  Cardiovascular:     Rate and Rhythm: Normal rate and regular rhythm.     Heart sounds: No murmur heard. Pulmonary:     Effort: Pulmonary effort is normal. No respiratory distress.     Breath sounds: Normal breath sounds.  Abdominal:     Palpations: Abdomen is soft.      Tenderness: There is no abdominal tenderness.  Musculoskeletal:        General: No swelling.     Cervical back: Neck supple.  Skin:    General: Skin is warm and dry.     Capillary Refill: Capillary refill takes less than 2 seconds.  Neurological:     Mental Status: She is alert.  Psychiatric:        Mood and Affect: Mood normal.     (all labs ordered are listed, but only abnormal results are displayed) Labs Reviewed  BASIC METABOLIC PANEL WITH GFR - Abnormal; Notable for the following components:      Result Value   Sodium 134 (*)    Chloride 94 (*)    BUN 86 (*)    Creatinine, Ser 9.11 (*)  Calcium  8.6 (*)    GFR, Estimated 4 (*)    Anion gap 17 (*)    All other components within normal limits  CBC WITH DIFFERENTIAL/PLATELET - Abnormal; Notable for the following components:   RBC 3.27 (*)    Hemoglobin 9.5 (*)    HCT 30.3 (*)    All other components within normal limits    EKG: EKG Interpretation Date/Time:  Friday March 07 2024 13:13:21 EST Ventricular Rate:  78 PR Interval:    QRS Duration:  155 QT Interval:  425 QTC Calculation: 485 R Axis:   -43  Text Interpretation: Atrial fibrillation Nonspecific IVCD with LAD Inferior infarct, old Anterior infarct, old Compared with prior EKG from 11/01/2023 Confirmed by Gennaro Bouchard ([id number]) on 03/07/2024 1:15:34 PM  Radiology: No results found.   Procedures   Medications Ordered in the ED - No data to display                                  Medical Decision Making Social determinants of health: Patient is noncompliant with dialysis  Patient sent in by nursing home for dialysis.  She has not gone in 7 days.  Creatinine is 9 but potassium is within normal limits.  Labs otherwise fairly unremarkable.  Patient's vitals are stable and she is agreeable to go to dialysis today.  I spoke with Dr. Adra, nephrology and they will have the patient dialyzed from the ER. She will be able to be discharged after that.    Problems Addressed: End stage renal disease ([name]): chronic illness or injury Noncompliance with renal dialysis: chronic illness or injury  Amount and/or Complexity of Data Reviewed External Data Reviewed: notes.    Details: Prior ED Records reviewed and patient seen 12/13/23 - patient seen for dizziness  Labs: ordered. Decision-making details documented in ED Course.    Details: Reviewed by me and creatinine is elevated at 9 but potassium is within normal limits Discussion of management or test interpretation with external provider(s): Dr. Jerrye - nephrology-she will have the patient dialyzed from the ER in the hospital and then patient can be d/c after   Risk OTC drugs. Prescription drug management. Drug therapy requiring intensive monitoring for toxicity. Diagnosis or treatment significantly limited by social determinants of health.    Final diagnoses:  End stage renal disease [facility])  Noncompliance with renal dialysis    ED Discharge Orders     None          Gennaro Bouchard CROME, DO 03/07/24 1533

## 2024-03-07 NOTE — ED Provider Notes (Signed)
 Patient seen earlier by Dr. Gennaro for need for hemodialysis due to apparent non-compliance. Patient denied any complaints. She has received dialysis and continues to have no complaints. She is stable for discharge back to her nursing facility.    Francesca Elsie CROME, MD 03/07/24 2036

## 2024-03-07 NOTE — Progress Notes (Signed)
 Pt receives out-pt HD at [facility] MWF 1045. Last day of HD was Friday last week. Will continue to assist.   Lavanda Fredrickson Dialysis nav [phone]

## 2024-03-07 NOTE — ED Triage Notes (Signed)
 Pt sent in from [facility] for not having dialysis in 7 days. Nursing staff states pt is noncompliant with treatments.

## 2024-03-07 NOTE — Procedures (Signed)
 Received patient in bed to unit.  Alert and oriented.  Informed consent signed and in chart.   Pt's Left chest catheter accessed and cleansed. Pt started tx with 1,000 bolus of heparin  given.  During tx had to switch the lines on pt catheter d/t high alarming arterial pressures. 100ml saline bolus given d/t pt BP dropping.  Pt requested to get of tx d/t buttocks hurting. Pt stated she would like to come back tomorrow if possible since she didn't meet her goal of 3.5L.  TX duration:2hr 10 mins  Patient tolerated well.  Transported pt back to the ER  Alert, without acute distress.  Hand-off given to patient's nurse.   Access used: Left chest catheter Access issues: None  Total UF removed: 2.2L Medication(s) given: None Post HD weight: Pt on stretcher Post HD VS: 106/63   Amina Menchaca [facility]

## 2024-03-07 NOTE — ED Notes (Signed)
 Pt given sandwich

## 2024-03-08 LAB — HEPATITIS B SURFACE ANTIBODY, QUANTITATIVE: Hep B S AB Quant (Post): <3.5 m[IU]/mL — ABNORMAL LOW

## 2024-03-10 NOTE — Progress Notes (Signed)
 Late Note Entry- Mar 10, 2024  Pt d/c back to snf on Fri. Contacted [facility] this morning to be advised of pt's d/c from ED and that pt should resume care today.   Randine Mungo Dialysis Navigator (coverage) [phone]

## 2024-05-05 ENCOUNTER — Emergency Department (HOSPITAL_COMMUNITY)
Admission: EM | Admit: 2024-05-05 | Discharge: 2024-05-06 | Disposition: A | Discharge status: Home / Self Care | Attending: Emergency Medicine | Admitting: Emergency Medicine

## 2024-05-05 ENCOUNTER — Other Ambulatory Visit: Payer: Self-pay

## 2024-05-05 ENCOUNTER — Emergency Department (HOSPITAL_COMMUNITY)

## 2024-05-05 ENCOUNTER — Encounter (HOSPITAL_COMMUNITY): Payer: Self-pay

## 2024-05-05 DIAGNOSIS — E875 Hyperkalemia: Secondary | ICD-10-CM | POA: Insufficient documentation

## 2024-05-05 DIAGNOSIS — N186 End stage renal disease: Secondary | ICD-10-CM | POA: Insufficient documentation

## 2024-05-05 DIAGNOSIS — Z992 Dependence on renal dialysis: Secondary | ICD-10-CM | POA: Diagnosis not present

## 2024-05-05 DIAGNOSIS — R918 Other nonspecific abnormal finding of lung field: Secondary | ICD-10-CM | POA: Insufficient documentation

## 2024-05-05 DIAGNOSIS — Z794 Long term (current) use of insulin: Secondary | ICD-10-CM | POA: Diagnosis not present

## 2024-05-05 DIAGNOSIS — Z7901 Long term (current) use of anticoagulants: Secondary | ICD-10-CM | POA: Insufficient documentation

## 2024-05-05 DIAGNOSIS — R5383 Other fatigue: Secondary | ICD-10-CM | POA: Diagnosis present

## 2024-05-05 LAB — BASIC METABOLIC PANEL WITH GFR
Anion gap: 23 — ABNORMAL HIGH (ref 5–15)
BUN: 106 mg/dL — ABNORMAL HIGH (ref 8–23)
CO2: 15 mmol/L — ABNORMAL LOW (ref 22–32)
Calcium: 8.4 mg/dL — ABNORMAL LOW (ref 8.9–10.3)
Chloride: 97 mmol/L — ABNORMAL LOW (ref 98–111)
Creatinine, Ser: 10.4 mg/dL — ABNORMAL HIGH (ref 0.44–1.00)
GFR, Estimated: 3 mL/min — ABNORMAL LOW
Glucose, Bld: 116 mg/dL — ABNORMAL HIGH (ref 70–99)
Potassium: 6.5 mmol/L (ref 3.5–5.1)
Sodium: 135 mmol/L (ref 135–145)

## 2024-05-05 LAB — CBC WITH DIFFERENTIAL/PLATELET
Abs Immature Granulocytes: 0.07 K/uL (ref 0.00–0.07)
Basophils Absolute: 0.1 K/uL (ref 0.0–0.1)
Basophils Relative: 1 %
Eosinophils Absolute: 0.2 K/uL (ref 0.0–0.5)
Eosinophils Relative: 2 %
HCT: 29.5 % — ABNORMAL LOW (ref 36.0–46.0)
Hemoglobin: 8.9 g/dL — ABNORMAL LOW (ref 12.0–15.0)
Immature Granulocytes: 1 %
Lymphocytes Relative: 23 %
Lymphs Abs: 2.6 K/uL (ref 0.7–4.0)
MCH: 29 pg (ref 26.0–34.0)
MCHC: 30.2 g/dL (ref 30.0–36.0)
MCV: 96.1 fL (ref 80.0–100.0)
Monocytes Absolute: 0.8 K/uL (ref 0.1–1.0)
Monocytes Relative: 7 %
Neutro Abs: 7.9 K/uL — ABNORMAL HIGH (ref 1.7–7.7)
Neutrophils Relative %: 66 %
Platelets: 245 K/uL (ref 150–400)
RBC: 3.07 MIL/uL — ABNORMAL LOW (ref 3.87–5.11)
RDW: 15 % (ref 11.5–15.5)
WBC: 11.6 K/uL — ABNORMAL HIGH (ref 4.0–10.5)
nRBC: 0 % (ref 0.0–0.2)

## 2024-05-05 MED ORDER — SODIUM ZIRCONIUM CYCLOSILICATE 5 G PO PACK
10.0000 g | PACK | Freq: Once | ORAL | Status: AC
  Administered 2024-05-05: 10 g via ORAL
  Filled 2024-05-05: qty 2

## 2024-05-05 MED ORDER — ALBUTEROL SULFATE (2.5 MG/3ML) 0.083% IN NEBU
2.5000 mg | INHALATION_SOLUTION | Freq: Once | RESPIRATORY_TRACT | Status: AC
  Administered 2024-05-05: 2.5 mg via RESPIRATORY_TRACT
  Filled 2024-05-05: qty 3

## 2024-05-05 NOTE — Discharge Instructions (Signed)
 It is important to go to dialysis tomorrow.  Return here for concerning changes in your condition.

## 2024-05-05 NOTE — ED Triage Notes (Signed)
 Pt bib EMS from [facility] with c/o fatigue/generalized weakness. Dialysis patient M-W-F. Last dialysis was 12/3. Pt stated she has been feeling to weak to go to dialysis center, Facility sent patient out to complete dialysis.

## 2024-05-05 NOTE — ED Provider Notes (Signed)
 " [place] EMERGENCY DEPARTMENT AT [facility] Provider Note   CSN: [id number] Arrival date & time: 05/05/24  1605     Patient presents with: Fatigue   Chelsea Meza is a 89 y.o. female.   HPI Patient presents from her skilled nursing facility with concern for weakness per nursing staff.  The patient herself denies any weakness, states that she feels in her usual state of health.  However patient notes that she is supposed to be on dialysis Monday, Wednesday Friday, today is Monday she has last had it 3 days ago did not go today.  Staff sent her here for concern of weakness patient denies focality, pain, lightheadedness, shortness of breath.  EMS does not report any hemodynamic instability in transport.    Prior to Admission medications  Medication Sig Start Date End Date Taking? Authorizing Provider  acetaminophen  (TYLENOL ) 325 MG tablet Take 2 tablets (650 mg total) by mouth every 6 (six) hours as needed for mild pain, fever or headache (or Fever >/= 101). 01/08/22   Pearlean Manus, MD  Amino Acids-Protein Hydrolys (FEEDING SUPPLEMENT, PRO-STAT SUGAR FREE 64,) LIQD Take 30 mLs by mouth every evening. 1800    [provider]  amLODipine  (NORVASC ) 5 MG tablet Take 5 mg by mouth daily. 09/22/23   [provider]  apixaban  (ELIQUIS ) 2.5 MG TABS tablet Take 1 tablet (2.5 mg total) by mouth 2 (two) times daily. 04/13/22 11/08/26  Kommor, Madison, MD  ascorbic acid (VITAMIN C) 500 MG tablet Take 500 mg by mouth 2 (two) times daily.    [provider]  atorvastatin  (LIPITOR) 40 MG tablet Take 40 mg by mouth at bedtime. (2100) 10/19/21   [provider]  Blood Glucose Monitoring Suppl (ACCU-CHEK GUIDE) w/Device KIT USE AS DIRECTED.I 09/03/20   [provider]  carvedilol  (COREG ) 3.125 MG tablet Take 1 tablet (3.125 mg total) by mouth 2 (two) times daily with a meal. 01/08/22 11/08/26  Emokpae, Courage, MD  cephALEXin  (KEFLEX ) 500 MG capsule  Take 1 capsule (500 mg total) by mouth 3 (three) times daily. 12/13/23   Suzette Pac, MD  Cholecalciferol (VITAMIN D3 SUPER STRENGTH) 50 MCG (2000 UT) TABS Take 2,000 Units by mouth in the morning. (0900)    [provider]  clotrimazole-betamethasone (LOTRISONE) cream Apply 1 Application topically See admin instructions. Apply under breasts topically every evening shift ever Wednesday and Saturday for management on chronic fungal rash after scheduled shower and every 12 hours as needed for chronic fungal rash. 08/23/23   [provider]  furosemide  (LASIX ) 40 MG tablet Take 1 tablet (40 mg total) by mouth daily. 01/08/22   Pearlean Manus, MD  hydrALAZINE  (APRESOLINE ) 50 MG tablet Take 50 mg by mouth every 8 (eight) hours. 09/22/23   [provider]  Hydrocortisone (ANUSOL-HC EX) Apply 1 Application topically every 8 (eight) hours as needed (hemorrhoids).    [provider]  insulin  lispro (HUMALOG ) 100 UNIT/ML injection Inject 4 Units into the skin with breakfast, with lunch, and with evening meal. Hold for FSBS less than 150 or if not eating 0800, 1300, 1700 01/09/22   [provider]  MELATONIN PO Take 6 mg by mouth at bedtime as needed (insomnia). (2200)    [provider]  Menthol-Zinc Oxide 0.44-20.6 % OINT Apply 1 Application topically in the morning, at noon, and at bedtime. Apply to buttocks, gluteal folds topically every shift for moisture associated skin damage after incontinence care.    [provider]  mineral oil enema Place 1 enema rectally every 8 (eight) hours as needed for moderate constipation.    [provider]  Multiple Vitamin (MULTIVITAMIN WITH MINERALS) TABS tablet Take 1 tablet by mouth in the morning. (0800)    [provider]  ondansetron  (ZOFRAN -ODT) 4 MG disintegrating tablet Take 4 mg by mouth every 4 (four) hours as needed for nausea or vomiting. For 2 doses. If not resolved after 2 doses  then Phenergan 25 mg (IM, PR, or per tube) PRN    [provider]  senna-docusate (SENOKOT-S) 8.6-50 MG tablet Take 1 tablet by mouth daily. Patient taking differently: Take 1 tablet by mouth in the morning. 0900 04/15/22   Garrick Charleston, MD  sevelamer carbonate (RENVELA) 800 MG tablet Take 800 mg by mouth 3 (three) times daily with meals.    [provider]  sodium zirconium cyclosilicate  (LOKELMA ) 10 g PACK packet Take 10 g by mouth every Monday, Wednesday, and Friday. 12/26/21   Evonnie Lenis, MD  zinc sulfate, 50mg  elemental zinc, 220 (50 Zn) MG capsule Take 220 mg by mouth daily.    [provider]    Allergies: Patient has no known allergies.    Review of Systems  Updated Vital Signs BP 133/77 (BP Location: Right Arm)   Pulse 88   Temp 97.6 F (36.4 C) (Oral)   Resp 18   SpO2 98%   Physical Exam Vitals and nursing note reviewed.  Constitutional:      General: She is not in acute distress.    Appearance: She is well-developed.  HENT:     Head: Normocephalic and atraumatic.  Eyes:     Conjunctiva/sclera: Conjunctivae normal.  Cardiovascular:     Rate and Rhythm: Normal rate and regular rhythm.  Pulmonary:     Effort: Pulmonary effort is normal. No respiratory distress.     Breath sounds: Normal breath sounds. No stridor.  Abdominal:     General: There is no distension.  Skin:    General: Skin is warm and dry.  Neurological:     Mental Status: She is alert and oriented to person, place, and time.     Cranial Nerves: No cranial nerve deficit.  Psychiatric:        Mood and Affect: Mood normal.     (all labs ordered are listed, but only abnormal results are displayed) Labs Reviewed  BASIC METABOLIC PANEL WITH GFR - Abnormal; Notable for the following components:      Result Value   Potassium 6.5 (*)    Chloride 97 (*)    CO2 15 (*)    Glucose, Bld 116 (*)    BUN 106 (*)    Creatinine, Ser 10.40 (*)    Calcium  8.4 (*)    GFR, Estimated  3 (*)    Anion gap 23 (*)    All other components within normal limits  CBC WITH DIFFERENTIAL/PLATELET - Abnormal; Notable for the following components:   WBC 11.6 (*)    RBC 3.07 (*)    Hemoglobin 8.9 (*)    HCT 29.5 (*)    Neutro Abs 7.9 (*)    All other components within normal limits    EKG: EKG Interpretation Date/Time:  Monday May 05 2024 19:04:09 EST Ventricular Rate:  80 PR Interval:  178 QRS Duration:  166 QT Interval:  424 QTC Calculation: 490 R Axis:   -48  Text Interpretation: Sinus rhythm with 1st degree A-V block Left bundle branch block  Confirmed by Garrick Charleston [phone]) on 05/05/2024 7:45:49 PM  Radiology: [facility] Chest Port 1 View Result Date: 05/05/2024 CLINICAL DATA:  Weakness fatigue EXAM: PORTABLE CHEST 1 VIEW COMPARISON:  11/01/2023 FINDINGS: Left-sided central venous catheter tip at the proximal right atrium. Hypoventilatory changes. Cardiomegaly and aortic atherosclerosis. Suspect airspace disease at left base with possible small left effusion. IMPRESSION: Hypoventilatory changes with suspected airspace disease/possible pneumonia at the left base and possible small left effusion. Electronically Signed   By: Luke Bun M.D.   On: 05/05/2024 17:36     Procedures   Medications Ordered in the ED  sodium zirconium cyclosilicate  (LOKELMA ) packet 10 g (10 g Oral Given 05/05/24 1856)  albuterol  (PROVENTIL ) (2.5 MG/3ML) 0.083% nebulizer solution 2.5 mg (2.5 mg Nebulization Given 05/05/24 1840)                                    Medical Decision Making Elderly female end-stage renal disease presents with staff concern for weakness on exam patient is awake, alert, in no distress speaking clearly hemodynamically unremarkable, has no increased work of breathing.  Concern for electrolyte abnormalities, fluid overload status given her absence from dialysis today.   Amount and/or Complexity of Data Reviewed Independent Historian: EMS External Data Reviewed:  notes. Labs: ordered. Decision-making details documented in ED Course. Radiology: ordered and independent interpretation performed. Decision-making details documented in ED Course. ECG/medicine tests: ordered and independent interpretation performed. Decision-making details documented in ED Course.  Risk Prescription drug management. Decision regarding hospitalization. Diagnosis or treatment significantly limited by social determinants of health.  Update: Patient with elevated potassium, though with no EKG changes.  We discussed meds for hyperkalemia including Lokelma , albuterol  both which patient is starting. Patient is adamant she would like to return to her nursing facility as she shares a room with her disabled daughter. Without distress, increased work of breathing, and after therapy for potassium, patient will return to her nursing facility, with anticipated dialysis tomorrow.     Final diagnoses:  Hyperkalemia    ED Discharge Orders     None          Garrick Charleston, MD 05/05/24 1946  "

## 2024-05-06 NOTE — ED Notes (Signed)
 Provided pt with turkey sandwich and diet drink at this time.
